# Patient Record
Sex: Male | Born: 1935 | Race: White | Hispanic: No | Marital: Married | Smoking: Former smoker
Health system: Southern US, Community
[De-identification: ages and names within clinical notes are randomized; demographics above are authoritative.]

## PROBLEM LIST (undated history)

## (undated) DIAGNOSIS — Z7901 Long term (current) use of anticoagulants: Secondary | ICD-10-CM

## (undated) DIAGNOSIS — I10 Essential (primary) hypertension: Secondary | ICD-10-CM

## (undated) DIAGNOSIS — I350 Nonrheumatic aortic (valve) stenosis: Secondary | ICD-10-CM

## (undated) DIAGNOSIS — D696 Thrombocytopenia, unspecified: Secondary | ICD-10-CM

## (undated) DIAGNOSIS — Z9889 Other specified postprocedural states: Secondary | ICD-10-CM

## (undated) DIAGNOSIS — N183 Chronic kidney disease, stage 3 unspecified: Secondary | ICD-10-CM

## (undated) DIAGNOSIS — E785 Hyperlipidemia, unspecified: Secondary | ICD-10-CM

## (undated) DIAGNOSIS — K5790 Diverticulosis of intestine, part unspecified, without perforation or abscess without bleeding: Secondary | ICD-10-CM

## (undated) DIAGNOSIS — I2510 Atherosclerotic heart disease of native coronary artery without angina pectoris: Secondary | ICD-10-CM

## (undated) DIAGNOSIS — G459 Transient cerebral ischemic attack, unspecified: Secondary | ICD-10-CM

## (undated) DIAGNOSIS — I5042 Chronic combined systolic (congestive) and diastolic (congestive) heart failure: Secondary | ICD-10-CM

## (undated) DIAGNOSIS — I6529 Occlusion and stenosis of unspecified carotid artery: Secondary | ICD-10-CM

## (undated) DIAGNOSIS — D126 Benign neoplasm of colon, unspecified: Secondary | ICD-10-CM

## (undated) DIAGNOSIS — I4821 Permanent atrial fibrillation: Secondary | ICD-10-CM

## (undated) DIAGNOSIS — I499 Cardiac arrhythmia, unspecified: Secondary | ICD-10-CM

## (undated) DIAGNOSIS — I5032 Chronic diastolic (congestive) heart failure: Secondary | ICD-10-CM

## (undated) HISTORY — PX: CATARACT EXTRACTION W/ INTRAOCULAR LENS  IMPLANT, BILATERAL: SHX1307

## (undated) HISTORY — PX: CORONARY ARTERY BYPASS GRAFT: SHX141

## (undated) HISTORY — DX: Transient cerebral ischemic attack, unspecified: G45.9

## (undated) HISTORY — DX: Permanent atrial fibrillation: I48.21

## (undated) HISTORY — DX: Diverticulosis of intestine, part unspecified, without perforation or abscess without bleeding: K57.90

## (undated) HISTORY — PX: MITRAL VALVE REPAIR: SHX2039

## (undated) HISTORY — DX: Atherosclerotic heart disease of native coronary artery without angina pectoris: I25.10

## (undated) HISTORY — DX: Chronic combined systolic (congestive) and diastolic (congestive) heart failure: I50.42

## (undated) HISTORY — DX: Nonrheumatic aortic (valve) stenosis: I35.0

## (undated) HISTORY — DX: Hyperlipidemia, unspecified: E78.5

## (undated) HISTORY — DX: Essential (primary) hypertension: I10

## (undated) HISTORY — DX: Benign neoplasm of colon, unspecified: D12.6

## (undated) HISTORY — DX: Occlusion and stenosis of unspecified carotid artery: I65.29

## (undated) HISTORY — DX: Chronic kidney disease, stage 3 (moderate): N18.3

## (undated) HISTORY — DX: Chronic kidney disease, stage 3 unspecified: N18.30

## (undated) HISTORY — DX: Chronic diastolic (congestive) heart failure: I50.32

---

## 1998-08-19 ENCOUNTER — Ambulatory Visit (HOSPITAL_COMMUNITY): Admission: RE | Admit: 1998-08-19 | Discharge: 1998-08-19 | Payer: Self-pay | Admitting: Cardiology

## 2001-04-08 ENCOUNTER — Encounter: Payer: Self-pay | Admitting: Internal Medicine

## 2001-04-08 ENCOUNTER — Ambulatory Visit (HOSPITAL_COMMUNITY): Admission: RE | Admit: 2001-04-08 | Discharge: 2001-04-08 | Payer: Self-pay | Admitting: Internal Medicine

## 2002-03-19 HISTORY — PX: CORONARY ARTERY BYPASS GRAFT: SHX141

## 2002-09-14 ENCOUNTER — Encounter: Payer: Self-pay | Admitting: Cardiothoracic Surgery

## 2002-09-16 ENCOUNTER — Encounter: Payer: Self-pay | Admitting: Cardiothoracic Surgery

## 2002-09-16 ENCOUNTER — Inpatient Hospital Stay (HOSPITAL_COMMUNITY): Admission: RE | Admit: 2002-09-16 | Discharge: 2002-09-21 | Payer: Self-pay | Admitting: Cardiothoracic Surgery

## 2002-09-16 DIAGNOSIS — Z951 Presence of aortocoronary bypass graft: Secondary | ICD-10-CM

## 2002-09-16 DIAGNOSIS — Z9889 Other specified postprocedural states: Secondary | ICD-10-CM

## 2002-09-16 HISTORY — DX: Other specified postprocedural states: Z98.890

## 2002-09-16 HISTORY — DX: Presence of aortocoronary bypass graft: Z95.1

## 2002-09-17 ENCOUNTER — Encounter: Payer: Self-pay | Admitting: Cardiothoracic Surgery

## 2002-09-18 ENCOUNTER — Encounter: Payer: Self-pay | Admitting: Cardiothoracic Surgery

## 2002-09-19 ENCOUNTER — Encounter: Payer: Self-pay | Admitting: Cardiothoracic Surgery

## 2002-11-02 ENCOUNTER — Encounter (HOSPITAL_COMMUNITY): Admission: RE | Admit: 2002-11-02 | Discharge: 2003-01-31 | Payer: Self-pay | Admitting: *Deleted

## 2006-06-17 ENCOUNTER — Ambulatory Visit: Payer: Self-pay | Admitting: Vascular Surgery

## 2010-11-23 ENCOUNTER — Emergency Department (HOSPITAL_COMMUNITY): Payer: Medicare Other

## 2010-11-23 ENCOUNTER — Inpatient Hospital Stay (HOSPITAL_COMMUNITY)
Admission: EM | Admit: 2010-11-23 | Discharge: 2010-11-27 | DRG: 310 | Disposition: A | Discharge status: Home / Self Care | Payer: Medicare Other | Attending: Emergency Medicine | Admitting: Emergency Medicine

## 2010-11-23 DIAGNOSIS — E785 Hyperlipidemia, unspecified: Secondary | ICD-10-CM | POA: Diagnosis present

## 2010-11-23 DIAGNOSIS — I10 Essential (primary) hypertension: Secondary | ICD-10-CM | POA: Diagnosis present

## 2010-11-23 DIAGNOSIS — Z951 Presence of aortocoronary bypass graft: Secondary | ICD-10-CM

## 2010-11-23 DIAGNOSIS — I2510 Atherosclerotic heart disease of native coronary artery without angina pectoris: Secondary | ICD-10-CM | POA: Diagnosis present

## 2010-11-23 DIAGNOSIS — Z7901 Long term (current) use of anticoagulants: Secondary | ICD-10-CM

## 2010-11-23 DIAGNOSIS — Z8673 Personal history of transient ischemic attack (TIA), and cerebral infarction without residual deficits: Secondary | ICD-10-CM

## 2010-11-23 DIAGNOSIS — I4891 Unspecified atrial fibrillation: Principal | ICD-10-CM | POA: Diagnosis present

## 2010-11-23 DIAGNOSIS — Z7982 Long term (current) use of aspirin: Secondary | ICD-10-CM

## 2010-11-23 LAB — CBC
HCT: 40.1 % (ref 39.0–52.0)
Hemoglobin: 13.5 g/dL (ref 13.0–17.0)
MCH: 29.7 pg (ref 26.0–34.0)
MCHC: 33.7 g/dL (ref 30.0–36.0)
MCV: 88.1 fL (ref 78.0–100.0)
Platelets: 142 10*3/uL — ABNORMAL LOW (ref 150–400)
RBC: 4.55 MIL/uL (ref 4.22–5.81)
RDW: 14.5 % (ref 11.5–15.5)
WBC: 7.2 10*3/uL (ref 4.0–10.5)

## 2010-11-23 LAB — DIFFERENTIAL
Basophils Absolute: 0 10*3/uL (ref 0.0–0.1)
Basophils Relative: 0 % (ref 0–1)
Eosinophils Absolute: 0.2 10*3/uL (ref 0.0–0.7)
Eosinophils Relative: 3 % (ref 0–5)
Lymphocytes Relative: 31 % (ref 12–46)
Lymphs Abs: 2.2 10*3/uL (ref 0.7–4.0)
Monocytes Absolute: 0.6 10*3/uL (ref 0.1–1.0)
Monocytes Relative: 9 % (ref 3–12)
Neutro Abs: 4.1 10*3/uL (ref 1.7–7.7)
Neutrophils Relative %: 57 % (ref 43–77)

## 2010-11-23 LAB — COMPREHENSIVE METABOLIC PANEL
ALT: 21 U/L (ref 0–53)
AST: 23 U/L (ref 0–37)
Albumin: 3.9 g/dL (ref 3.5–5.2)
Alkaline Phosphatase: 64 U/L (ref 39–117)
BUN: 28 mg/dL — ABNORMAL HIGH (ref 6–23)
CO2: 26 mEq/L (ref 19–32)
Calcium: 9.3 mg/dL (ref 8.4–10.5)
Chloride: 105 mEq/L (ref 96–112)
Creatinine, Ser: 1.51 mg/dL — ABNORMAL HIGH (ref 0.50–1.35)
GFR calc Af Amer: 55 mL/min — ABNORMAL LOW (ref >60)
GFR calc non Af Amer: 45 mL/min — ABNORMAL LOW (ref >60)
Glucose, Bld: 134 mg/dL — ABNORMAL HIGH (ref 70–99)
Potassium: 3.9 mEq/L (ref 3.5–5.1)
Sodium: 140 mEq/L (ref 135–145)
Total Bilirubin: 0.3 mg/dL (ref 0.3–1.2)
Total Protein: 6.6 g/dL (ref 6.0–8.3)

## 2010-11-23 LAB — POCT I-STAT TROPONIN I: Troponin i, poc: 0.01 ng/mL (ref 0.00–0.08)

## 2010-11-23 LAB — CK TOTAL AND CKMB (NOT AT ARMC)
CK, MB: 3.2 ng/mL (ref 0.3–4.0)
Total CK: 141 U/L (ref 7–232)

## 2010-11-24 DIAGNOSIS — I059 Rheumatic mitral valve disease, unspecified: Secondary | ICD-10-CM

## 2010-11-24 LAB — LIPID PANEL
Cholesterol: 154 mg/dL (ref 0–200)
HDL: 41 mg/dL (ref >39)
Total CHOL/HDL Ratio: 3.8 RATIO
Triglycerides: 50 mg/dL (ref <150)

## 2010-11-24 LAB — CARDIAC PANEL(CRET KIN+CKTOT+MB+TROPI)
Relative Index: INVALID (ref 0.0–2.5)
Total CK: 78 U/L (ref 7–232)
Total CK: 71 U/L (ref 7–232)

## 2010-11-24 LAB — TSH: TSH: 5.311 u[IU]/mL — ABNORMAL HIGH (ref 0.350–4.500)

## 2010-11-24 LAB — PROTIME-INR
INR: 1.07 (ref 0.00–1.49)
Prothrombin Time: 14.1 seconds (ref 11.6–15.2)

## 2010-11-24 LAB — TROPONIN I
Troponin I: <0.3 ng/mL (ref <0.30)
Troponin I: <0.3 ng/mL (ref <0.30)

## 2010-11-25 LAB — PROTIME-INR: INR: 1.08 (ref 0.00–1.49)

## 2010-11-25 LAB — CARDIAC PANEL(CRET KIN+CKTOT+MB+TROPI)
Relative Index: INVALID (ref 0.0–2.5)
Relative Index: INVALID (ref 0.0–2.5)
Total CK: 60 U/L (ref 7–232)
Troponin I: <0.3 ng/mL (ref <0.30)
Troponin I: <0.3 ng/mL (ref <0.30)

## 2010-11-25 LAB — HEPARIN LEVEL (UNFRACTIONATED): Heparin Unfractionated: 0.41 IU/mL (ref 0.30–0.70)

## 2010-11-25 LAB — CBC
HCT: 38.7 % — ABNORMAL LOW (ref 39.0–52.0)
Hemoglobin: 12.8 g/dL — ABNORMAL LOW (ref 13.0–17.0)
RBC: 4.37 MIL/uL (ref 4.22–5.81)
RDW: 14.4 % (ref 11.5–15.5)
WBC: 6.8 10*3/uL (ref 4.0–10.5)

## 2010-11-26 LAB — PROTIME-INR
INR: 1.12 (ref 0.00–1.49)
Prothrombin Time: 14.6 seconds (ref 11.6–15.2)

## 2010-11-26 LAB — DIFFERENTIAL
Basophils Absolute: 0 10*3/uL (ref 0.0–0.1)
Basophils Relative: 1 % (ref 0–1)
Eosinophils Relative: 3 % (ref 0–5)
Lymphocytes Relative: 23 % (ref 12–46)
Monocytes Absolute: 0.8 10*3/uL (ref 0.1–1.0)
Monocytes Relative: 10 % (ref 3–12)

## 2010-11-26 LAB — CARDIAC PANEL(CRET KIN+CKTOT+MB+TROPI)
CK, MB: 2.4 ng/mL (ref 0.3–4.0)
CK, MB: 1.9 ng/mL (ref 0.3–4.0)
Relative Index: INVALID (ref 0.0–2.5)
Relative Index: INVALID (ref 0.0–2.5)
Troponin I: <0.3 ng/mL (ref <0.30)
Troponin I: <0.3 ng/mL (ref <0.30)

## 2010-11-26 LAB — CBC
MCHC: 34.1 g/dL (ref 30.0–36.0)
Platelets: 125 10*3/uL — ABNORMAL LOW (ref 150–400)
RDW: 14.3 % (ref 11.5–15.5)

## 2010-11-26 LAB — HEPARIN LEVEL (UNFRACTIONATED): Heparin Unfractionated: 0.77 IU/mL — ABNORMAL HIGH (ref 0.30–0.70)

## 2010-11-30 LAB — CULTURE, BLOOD (ROUTINE X 2): Culture  Setup Time: 201209072334

## 2010-12-28 NOTE — Discharge Summary (Signed)
NAMEBRAYSEN, [name] NO.:  [id number]  MEDICAL RECORD NO.:  [id number]  LOCATION:  [id number]                         FACILITY:  [facility]  PHYSICIAN:  Leana Gamer, MDDATE OF BIRTH:  06/27/1931  DATE OF ADMISSION:  11/23/2010 DATE OF DISCHARGE:  11/27/2010                              DISCHARGE SUMMARY   DISCHARGE DISPOSITION:  Home.  FINAL DISCHARGE DIAGNOSES: 1. Paroxysmal atrial fibrillation with rapid ventricular rate now     converted to sinus rhythm.  Rate controlled. 2. Hyperlipidemia. 3. Hypertension. 4. History of mitral valve annuloplasty. 5. History of transient ischemic attack in the past. 6. Coronary artery disease with one-vessel bypass.  DISCHARGE MEDICATIONS: 1. Coumadin.  The patient is to take 10 mg p.o. x1 tonight and then     7.5 mg p.o. x1 tomorrow, November 28, 2010, then have INR checked     on Wednesday, November 29, 2010, and further titrate to a goal INR     of 2-3. 2. Diltiazem 120 mg p.o. daily. 3. Pravachol 40 mg p.o. at bedtime.  CONSULTANTS:  None.  PROCEDURES:  None.  DIAGNOSTIC STUDIES: 1. Portable chest x-ray on admission which shows no evidence of active     pulmonary disease. 2. Two-D echocardiogram done on November 24, 2010, which showed left     ventricular cavity size normal with systolic function preserved at     60-65%, and moderate mitral valve stenosis.  PRIMARY CARE PHYSICIAN:  Azalia Bilis, MD  ALLERGIES:  No known drug allergies.  CODE STATUS:  Full code.  CHIEF COMPLAINT:  Palpitations and lightheadedness.  HISTORY OF PRESENT ILLNESS:  Please refer to the H and P by Dr. Raynelle Chary for details of the HPI, however, in short this is a 77 year old gentleman who had had several episodes of palpitations and lightheadedness for 2 days prior to presentation.  The patient states that he has checked in his blood pressure on his wife's blood pressure cuff and saw that his heart rate was very high.  He  said this correlated with palpitations that he was having, thus he came to the emergency room for further evaluation and management.  HOSPITAL COURSE: 1. Atrial fibrillation with rapid ventricular response.  The patient     was given a Cardizem bolus and drip and converted to sinus rhythm.     He was then transitioned over to oral Cardizem short-acting and     then to long-acting Cardizem.  At the time of discharge, the     patient's heart rate is well controlled and has been controlled for     the last 24 hours on long-acting Cardizem.  The patient was also     started on full-dose heparin on admission and Coumadin was started.     He was then transitioned over to Lovenox.  The patient has a [place]     score of 1 and does not require overlap with Coumadin for     titration.  The patient will follow with [facility] as     an outpatient for further titration of his medications.  The     patient's hospital course was prolonged and  although he was     medically stable for discharge, he could not be safely discharged  and we did not have a followup for the patient's first Coumadin     that has been achieved, then the patient will be discharged home.     At the time of discharge, the patient's INR is 1.37.  The patient     will take 10 mg of Coumadin tonight, 7.5 mg tomorrow and then     further titration per [facility]. 2. Hyperlipidemia.  The patient is continued on his Pravachol.  The patient does take herbal vitamins for colon cancer and prostate health.  There is some question as to whether or not he should continue these right now or discontinue these medications and he can have further direction from his primary care physician as to whether he should continue taking this.  PHYSICAL EXAMINATION:  GENERAL:  At the time of discharge, the patient is stable. VITAL SIGNS:  Temperature is 97.9, heart rate 75, blood pressure 149/77, respiratory rate 18, O2 sats are 99%  on room air. HEENT:  Normocephalic, atraumatic.  Pupils are equally round and reactive to light and accommodation.  Extraocular movements are intact. Oropharynx is moist.  No active erythema or lesions are noted. NECK:  Trachea is midline.  No masses, no thyromegaly, no JVD, no carotid bruits. RESPIRATORY:  The patient has a normal respiratory effort, equal excursion bilaterally.  No wheezing, no rhonchi noted. CARDIOVASCULAR:  He has got normal S1-S2.  No murmurs, rubs or gallops are noted.  PMI is nondisplaced.  No heaves or thrills on palpation. ABDOMEN:  Obese, soft, nontender, nondistended.  No masses, no hepatosplenomegaly noted. EXTREMITY:  No clubbing, cyanosis or edema.  DIETARY RESTRICTIONS:  The patient should be on a heart-healthy diet.  PHYSICAL RESTRICTIONS:  Activity as tolerated.  FOLLOWUP:  The patient is to follow up with his primary care physician, Dr. Samara Snide within 1 week.  The patient also has an appointment with [facility] for Wednesday, November 29, 2010, between 7 a.m. and 6 p.m.     Leana Gamer, MD    MAM/MEDQ  D:  11/27/2010  T:  11/27/2010  Job:  [id number]  Electronically Signed by Liston Alba MD on 12/28/2010 07:20:26 AM

## 2010-12-31 NOTE — H&P (Signed)
[name], Phillips NO.:  [id number]  MEDICAL RECORD NO.:  [id number]  LOCATION:  [facility]                         FACILITY:  [facility]  PHYSICIAN:  Orvan Falconer, MD           DATE OF BIRTH:  1931-08-23  DATE OF ADMISSION:  11/23/2010 DATE OF DISCHARGE:                             HISTORY & PHYSICAL   PRIMARY CARE PHYSICIAN:  Dr. Kenton Kingfisher.  ADVANCE DIRECTIVE:  Full code.  REASON FOR ADMISSION:  Atrial fibrillation with rapid ventricular rate.  HISTORY OF PRESENT ILLNESS:  This is a 77 year old male with a history of mitral valve annuloplasty and ring replacement in 2004 , postop atrial fibrillation, recurrent TIAs, presents to the emergency room with a 2-day history of feeling palpitations and lightheadedness.  He was found to be in atrial fibrillation with rapid ventricular rate in emergency room.  He was given Cardizem bolus and a drip and heparin was started.  He could not delineate exactly how long he was in atrial fibrillation.  He stated his TIA is usually with blurry vision and lasting several minutes about once a month.  Subsequently, he converted to normal sinus rhythm.  Further evaluation showed creatinine 1.5, blood glucose 134, potassium 3.9, his chest x-ray is clear, and his cardiac enzymes were unremarkable.  He has a normal white count of 7.2 thousand, hemoglobin of 13.5.  Hospitalist was asked to admit the patient for AFib with rapid ventricular rate.  PAST MEDICAL HISTORY: 1. Recurrent TIA. 2. Mitral valve annuloplasty. 3. One-vessel bypass. 4. Hypercholesterolemia.  CURRENT MEDICATIONS:  Aspirin and 40 mg of pravastatin.  ALLERGIES:  NO KNOWN DRUG ALLERGIES.  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  He is married, with children.  Denied tobacco, alcohol, or drug use.  REVIEW OF SYSTEMS:  Otherwise, unremarkable.  PHYSICAL EXAM:  VITAL SIGNS:  Blood pressure is 118/66, temperature of 98.2, pulse of 72, respiratory  rate is 16. GENERAL:  He is alert, oriented and is in no apparent distress. HEENT:  Pupils equal, round, reactive.  Sclerae nonicteric. NECK:  Supple. CARDIAC:  S1 and S2 with a 2/6 systolic ejection murmur, irregularly irregular. LUNGS:  Clear with no wheezes, rales, or any evidence of consolidation. ABDOMEN:  Soft, nondistended, nontender.  He has an umbilical hernia centrally. EXTREMITIES:  No edema.  No calf tenderness. SKIN:  Warm and dry.  Good distal pulses bilaterally. NEUROLOGIC:  Unremarkable. PSYCHIATRIC:  Unremarkable. He has no carotid bruits audible.  OBJECTIVE FINDINGS:  White count 7.2 thousand, hemoglobin of 13.5, platelet count 142,000.  Chest x-ray is clear.  Potassium 3.9, creatinine 1.5, glucose 134.  Liver function tests are normal.  Troponin 0.1.  IMPRESSION:  This is a 77 year old with recurrent transient ischemic attacks and likely had paroxysmal atrial fibrillation (CHADS score of 3), admitted tonight for atrial fibrillation with rapid ventricular rate.  I suspect that he had atrial fibrillation greater than 2 days, not realizing it, therefore, we will fully anticoagulate with heparin and Coumadin.  We will start him on Cardizem 30 mg p.o. q.6 h. and stop the Cardizem drip.  He is stable, full code, and will  be admitted to [facility].  We will check a TSH and rule him out.  We will get an echo of his heart as well.     Orvan Falconer, MD     PL/MEDQ  D:  11/24/2010  T:  11/24/2010  Job:  [id number]  Electronically Signed by Orvan Falconer  on 12/31/2010 01:17:54 PM

## 2011-04-11 ENCOUNTER — Encounter: Payer: Self-pay | Admitting: General Practice

## 2011-04-17 ENCOUNTER — Encounter: Payer: Self-pay | Admitting: Internal Medicine

## 2011-05-10 ENCOUNTER — Encounter: Payer: Self-pay | Admitting: *Deleted

## 2011-05-15 ENCOUNTER — Encounter: Payer: Self-pay | Admitting: Internal Medicine

## 2011-05-22 ENCOUNTER — Encounter: Payer: Self-pay | Admitting: Internal Medicine

## 2011-05-22 ENCOUNTER — Ambulatory Visit (INDEPENDENT_AMBULATORY_CARE_PROVIDER_SITE_OTHER): Payer: Medicare Other | Admitting: Internal Medicine

## 2011-05-22 DIAGNOSIS — D689 Coagulation defect, unspecified: Secondary | ICD-10-CM

## 2011-05-22 DIAGNOSIS — Z1211 Encounter for screening for malignant neoplasm of colon: Secondary | ICD-10-CM

## 2011-05-22 MED ORDER — PEG-KCL-NACL-NASULF-NA ASC-C 100 G PO SOLR: 1.0000 | Freq: Once | ORAL | Status: DC

## 2011-05-22 NOTE — Progress Notes (Signed)
[name] 10-Apr-1931 MRN [id number]   History of Present Illness:  This is a 78 year old white male who is here to discuss a colonoscopy. We did a screening colonoscopy in December 2002 which showed mild diverticulosis of the left colon. He had an episode of atrial fibrillation in September 2012 and has had several episodes of TIAs. He has been put on Coumadin and has maintained his INR around 2-3. He had a coronary artery bypass graft of one-vessel and mitral valve repair in 2004. He has regular bowel habits. He denies abdominal pain or family history of colon cancer.   Past Medical History  Diagnosis Date  . Atrial fibrillation   . Diverticulosis 2002    Colonoscopy  . TIA (transient ischemic attack)   . Hyperlipidemia   . Hypertension   . Hemorrhoids    Past Surgical History  Procedure Date  . Coronary artery bypass graft   . Mitral valve repair     reports that he has never smoked. He has never used smokeless tobacco. He reports that he does not drink alcohol or use illicit drugs. family history is not on file. Allergies not on file      Review of Systems: Denies heartburn dysphagia or shortness of breath or chest pain  The remainder of the 10 point ROS is negative except as outlined in H&P   Physical Exam: General appearance  Well developed, in no distress. Eyes- non icteric. HEENT nontraumatic, normocephalic. Mouth no lesions, tongue papillated, no cheilosis. Neck supple without adenopathy, thyroid not enlarged, no carotid bruits, no JVD. Lungs Clear to auscultation bilaterally., Well-healed thoracotomy scar Cor normal S1, normal S2, regular rhythm, no murmur,  quiet precordium. Abdomen: Soft nontender abdomen with normal active bowel sounds. Liver edge at costal margin. Rectal: Not done Extremities trace pedal edema. Skin no lesions. Ecchymoses on the dorsum of his hands. Neurological alert and oriented x 3. Psychological normal mood and  affect.  Assessment and Plan:  Problem #26 78 year old, white male with a history of heart disease and a normal colonoscopy 10 years ago. He is an appropriate candidate for a screening colonoscopy and he wishes to go ahead with the procedure. Because of his history of TIAs, he will stay on Coumadin and have his INR come down to around 2 prior to the procedure. We will hold his Coumadin for 2 days instead of the usual 5 days prior to the procedure which would  allow me to cauterize possible polyps or take biopsies if necessary. He had a mitral annuloplasty. Current guidelines for SBE prophylaxis do not require patient's to get antibiotics prior to a colonoscopy.   05/22/2011 Delfin Edis

## 2011-05-22 NOTE — Patient Instructions (Addendum)
You have been scheduled for a colonoscopy with propofol. Please follow written instructions given to you at your visit today.  Please pick up your prep kit at the pharmacy within the next 1-3 days. Per Dr Delfin Edis, please hold your coumadin only TWO days prior to your exam. CC: Dr Azalia Bilis, Dr Lacie Draft

## 2011-06-27 ENCOUNTER — Encounter: Payer: Self-pay | Admitting: Internal Medicine

## 2011-06-27 ENCOUNTER — Ambulatory Visit (AMBULATORY_SURGERY_CENTER): Payer: Medicare Other | Admitting: Internal Medicine

## 2011-06-27 VITALS — BP 150/76 | HR 76 | Temp 96.3°F | Resp 20 | Ht 69.0 in | Wt 201.0 lb

## 2011-06-27 DIAGNOSIS — Z1211 Encounter for screening for malignant neoplasm of colon: Secondary | ICD-10-CM

## 2011-06-27 DIAGNOSIS — K6389 Other specified diseases of intestine: Secondary | ICD-10-CM

## 2011-06-27 DIAGNOSIS — D126 Benign neoplasm of colon, unspecified: Secondary | ICD-10-CM

## 2011-06-27 MED ORDER — SODIUM CHLORIDE 0.9 % IV SOLN: 500.0000 mL | INTRAVENOUS | Status: DC

## 2011-06-27 NOTE — Op Note (Signed)
[facility] [address] [place], [place]  [id number]  COLONOSCOPY PROCEDURE REPORT  PATIENT:  Micheal, Phillips  MR#:  [id number] BIRTHDATE:  1931/08/26, 79 yrs. old  GENDER:  male ENDOSCOPIST:  Lowella Bandy. Olevia Perches, MD REF. BY:  Azalia Bilis, M.D. PROCEDURE DATE:  06/27/2011 PROCEDURE:  Colonoscopy with biopsy ASA CLASS:  Class III INDICATIONS:  colorectal cancer screening, average risk last colon 2000, off Coumadin x5 days MEDICATIONS:   MAC sedation, administered by CRNA, propofol (Diprivan) 170 mg  DESCRIPTION OF PROCEDURE:   After the risks and benefits and of the procedure were explained, informed consent was obtained. Digital rectal exam was performed and revealed no rectal masses. The LB CF-[id number] endoscope was introduced through the anus and advanced to the cecum, which was identified by both the appendix and ileocecal valve.  The quality of the prep was good, using MoviPrep.  The instrument was then slowly withdrawn as the colon was fully examined. <<PROCEDUREIMAGES>>  FINDINGS:  A sessile polyp was found in the cecum. 4 mm diminutive poly The polyp was removed using cold biopsy forceps (see image5). Melanosis coli was found throughout the colon (see image4, image2, and image3).  Mild diverticulosis was found in the sigmoid colon (see image1).  Otherwise normal colonoscopy without other polyps, masses, vascular ectasias, or inflammatory changes (see image7, image6, and image4).   Retroflexed views in the rectum revealed no abnormalities.    The scope was then withdrawn from the patient and the procedure completed.  COMPLICATIONS:  None ENDOSCOPIC IMPRESSION: 1) Sessile polyp in the cecum 2) Melanosis throughout the colon 3) Mild diverticulosis in the sigmoid colon 4) Otherwise nl colonoscopy WMO RECOMMENDATIONS: 1) Await pathology results 2) High fiber diet. reduce laxative use may resume Coumadin on 06/27/2009  REPEAT EXAM:  In 0 year(s) for.  no  recall due to age and risk factors  ______________________________ Lowella Bandy. Olevia Perches, MD  CC:  n. eSIGNED:   [name] at 06/27/2011 02:06 PM  Quenten Raven, [id number]

## 2011-06-27 NOTE — Patient Instructions (Signed)
YOU HAD AN ENDOSCOPIC PROCEDURE TODAY AT THE [facility]: Refer to the procedure report that was given to you for any specific questions about what was found during the examination.  If the procedure report does not answer your questions, please call your gastroenterologist to clarify.  If you requested that your care partner not be given the details of your procedure findings, then the procedure report has been included in a sealed envelope for you to review at your convenience later.  YOU SHOULD EXPECT: Some feelings of bloating in the abdomen. Passage of more gas than usual.  Walking can help get rid of the air that was put into your GI tract during the procedure and reduce the bloating. If you had a lower endoscopy (such as a colonoscopy or flexible sigmoidoscopy) you may notice spotting of blood in your stool or on the toilet paper. If you underwent a bowel prep for your procedure, then you may not have a normal bowel movement for a few days.  DIET: Your first meal following the procedure should be a light meal and then it is ok to progress to your normal diet.  A half-sandwich or bowl of soup is an example of a good first meal.  Heavy or fried foods are harder to digest and may make you feel nauseous or bloated.  Likewise meals heavy in dairy and vegetables can cause extra gas to form and this can also increase the bloating.  Drink plenty of fluids but you should avoid alcoholic beverages for 24 hours.  ACTIVITY: Your care partner should take you home directly after the procedure.  You should plan to take it easy, moving slowly for the rest of the day.  You can resume normal activity the day after the procedure however you should NOT DRIVE or use heavy machinery for 24 hours (because of the sedation medicines used during the test).    SYMPTOMS TO REPORT IMMEDIATELY: A gastroenterologist can be reached at any hour.  During normal business hours, 8:30 AM to 5:00 PM Monday through Friday,  call [phone].  After hours and on weekends, please call the GI answering service at [phone] who will take a message and have the physician on call contact you.   Following lower endoscopy (colonoscopy or flexible sigmoidoscopy):  Excessive amounts of blood in the stool  Significant tenderness or worsening of abdominal pains  Swelling of the abdomen that is new, acute  Fever of 100F or higher    FOLLOW UP: If any biopsies were taken you will be contacted by phone or by letter within the next 1-3 weeks.  Call your gastroenterologist if you have not heard about the biopsies in 3 weeks.  Our staff will call the home number listed on your records the next business day following your procedure to check on you and address any questions or concerns that you may have at that time regarding the information given to you following your procedure. This is a courtesy call and so if there is no answer at the home number and we have not heard from you through the emergency physician on call, we will assume that you have returned to your regular daily activities without incident.  SIGNATURES/CONFIDENTIALITY: You and/or your care partner have signed paperwork which will be entered into your electronic medical record.  These signatures attest to the fact that that the information above on your After Visit Summary has been reviewed and is understood.  Full responsibility of the confidentiality   of this discharge information lies with you and/or your care-partner.     

## 2011-06-27 NOTE — Progress Notes (Signed)
Patient did not experience any of the following events: a burn prior to discharge; a fall within the facility; wrong site/side/patient/procedure/implant event; or a hospital transfer or hospital admission upon discharge from the facility. (G8907) Patient did not have preoperative order for IV antibiotic SSI prophylaxis. (G8918)  

## 2011-06-28 ENCOUNTER — Telehealth: Payer: Self-pay

## 2011-06-28 NOTE — Telephone Encounter (Signed)
  Follow up Call-  Call back number 06/27/2011  Post procedure Call Back phone  # [phone]  Permission to leave phone message Yes     Patient questions:  Do you have a fever, pain , or abdominal swelling? no Pain Score  0 *  Have you tolerated food without any problems? yes  Have you been able to return to your normal activities? yes  Do you have any questions about your discharge instructions: Diet   no Medications  no Follow up visit  no  Do you have questions or concerns about your Care? no  Actions: * If pain score is 4 or above: No action needed, pain <4.

## 2011-07-03 ENCOUNTER — Encounter: Payer: Self-pay | Admitting: Internal Medicine

## 2013-07-10 ENCOUNTER — Encounter: Payer: Self-pay | Admitting: *Deleted

## 2013-08-07 ENCOUNTER — Ambulatory Visit: Payer: Medicare Other | Admitting: Internal Medicine

## 2014-01-09 ENCOUNTER — Encounter: Payer: Self-pay | Admitting: *Deleted

## 2014-02-06 ENCOUNTER — Inpatient Hospital Stay (HOSPITAL_COMMUNITY)
Admission: EM | Admit: 2014-02-06 | Discharge: 2014-02-09 | DRG: 682 | Disposition: A | Discharge status: Home / Self Care | Payer: Medicare Other | Attending: Internal Medicine | Admitting: Internal Medicine

## 2014-02-06 ENCOUNTER — Emergency Department (HOSPITAL_COMMUNITY): Payer: Medicare Other

## 2014-02-06 ENCOUNTER — Encounter (HOSPITAL_COMMUNITY): Payer: Self-pay | Admitting: *Deleted

## 2014-02-06 DIAGNOSIS — Z952 Presence of prosthetic heart valve: Secondary | ICD-10-CM

## 2014-02-06 DIAGNOSIS — N183 Chronic kidney disease, stage 3 (moderate): Secondary | ICD-10-CM | POA: Diagnosis present

## 2014-02-06 DIAGNOSIS — N179 Acute kidney failure, unspecified: Secondary | ICD-10-CM | POA: Diagnosis present

## 2014-02-06 DIAGNOSIS — I5042 Chronic combined systolic (congestive) and diastolic (congestive) heart failure: Secondary | ICD-10-CM | POA: Diagnosis present

## 2014-02-06 DIAGNOSIS — Z7901 Long term (current) use of anticoagulants: Secondary | ICD-10-CM

## 2014-02-06 DIAGNOSIS — E785 Hyperlipidemia, unspecified: Secondary | ICD-10-CM | POA: Diagnosis present

## 2014-02-06 DIAGNOSIS — E039 Hypothyroidism, unspecified: Secondary | ICD-10-CM | POA: Diagnosis present

## 2014-02-06 DIAGNOSIS — Z951 Presence of aortocoronary bypass graft: Secondary | ICD-10-CM | POA: Diagnosis not present

## 2014-02-06 DIAGNOSIS — R918 Other nonspecific abnormal finding of lung field: Secondary | ICD-10-CM

## 2014-02-06 DIAGNOSIS — I482 Chronic atrial fibrillation: Secondary | ICD-10-CM | POA: Diagnosis present

## 2014-02-06 DIAGNOSIS — I340 Nonrheumatic mitral (valve) insufficiency: Secondary | ICD-10-CM | POA: Diagnosis present

## 2014-02-06 DIAGNOSIS — I5032 Chronic diastolic (congestive) heart failure: Secondary | ICD-10-CM | POA: Diagnosis present

## 2014-02-06 DIAGNOSIS — R531 Weakness: Secondary | ICD-10-CM

## 2014-02-06 DIAGNOSIS — Z86010 Personal history of colonic polyps: Secondary | ICD-10-CM | POA: Diagnosis not present

## 2014-02-06 DIAGNOSIS — I509 Heart failure, unspecified: Secondary | ICD-10-CM

## 2014-02-06 DIAGNOSIS — I129 Hypertensive chronic kidney disease with stage 1 through stage 4 chronic kidney disease, or unspecified chronic kidney disease: Secondary | ICD-10-CM | POA: Diagnosis present

## 2014-02-06 DIAGNOSIS — I4821 Permanent atrial fibrillation: Secondary | ICD-10-CM | POA: Diagnosis present

## 2014-02-06 DIAGNOSIS — J189 Pneumonia, unspecified organism: Secondary | ICD-10-CM | POA: Diagnosis present

## 2014-02-06 DIAGNOSIS — I10 Essential (primary) hypertension: Secondary | ICD-10-CM | POA: Diagnosis present

## 2014-02-06 DIAGNOSIS — I5043 Acute on chronic combined systolic (congestive) and diastolic (congestive) heart failure: Secondary | ICD-10-CM | POA: Diagnosis present

## 2014-02-06 DIAGNOSIS — Z8673 Personal history of transient ischemic attack (TIA), and cerebral infarction without residual deficits: Secondary | ICD-10-CM | POA: Diagnosis not present

## 2014-02-06 DIAGNOSIS — Z7982 Long term (current) use of aspirin: Secondary | ICD-10-CM

## 2014-02-06 DIAGNOSIS — I480 Paroxysmal atrial fibrillation: Secondary | ICD-10-CM | POA: Diagnosis present

## 2014-02-06 DIAGNOSIS — I2510 Atherosclerotic heart disease of native coronary artery without angina pectoris: Secondary | ICD-10-CM | POA: Diagnosis present

## 2014-02-06 DIAGNOSIS — Z9889 Other specified postprocedural states: Secondary | ICD-10-CM

## 2014-02-06 DIAGNOSIS — D696 Thrombocytopenia, unspecified: Secondary | ICD-10-CM | POA: Diagnosis present

## 2014-02-06 HISTORY — DX: Long term (current) use of anticoagulants: Z79.01

## 2014-02-06 HISTORY — DX: Other specified postprocedural states: Z98.890

## 2014-02-06 HISTORY — DX: Hyperlipidemia, unspecified: E78.5

## 2014-02-06 HISTORY — DX: Essential (primary) hypertension: I10

## 2014-02-06 LAB — CBC WITH DIFFERENTIAL/PLATELET
Basophils Absolute: 0 10*3/uL (ref 0.0–0.1)
Basophils Relative: 1 % (ref 0–1)
Eosinophils Absolute: 0.2 10*3/uL (ref 0.0–0.7)
Eosinophils Relative: 3 % (ref 0–5)
HCT: 46.7 % (ref 39.0–52.0)
HEMOGLOBIN: 16 g/dL (ref 13.0–17.0)
LYMPHS ABS: 0.8 10*3/uL (ref 0.7–4.0)
Lymphocytes Relative: 13 % (ref 12–46)
MCH: 31 pg (ref 26.0–34.0)
MCHC: 34.3 g/dL (ref 30.0–36.0)
MCV: 90.5 fL (ref 78.0–100.0)
MONOS PCT: 8 % (ref 3–12)
Monocytes Absolute: 0.5 10*3/uL (ref 0.1–1.0)
NEUTROS ABS: 5 10*3/uL (ref 1.7–7.7)
NEUTROS PCT: 75 % (ref 43–77)
Platelets: 121 10*3/uL — ABNORMAL LOW (ref 150–400)
RBC: 5.16 MIL/uL (ref 4.22–5.81)
RDW: 14.2 % (ref 11.5–15.5)
WBC: 6.7 10*3/uL (ref 4.0–10.5)

## 2014-02-06 LAB — BASIC METABOLIC PANEL
ANION GAP: 15 (ref 5–15)
BUN: 32 mg/dL — AB (ref 6–23)
CHLORIDE: 101 meq/L (ref 96–112)
CO2: 23 mEq/L (ref 19–32)
CREATININE: 1.8 mg/dL — AB (ref 0.50–1.35)
Calcium: 9.3 mg/dL (ref 8.4–10.5)
GFR calc Af Amer: 40 mL/min — ABNORMAL LOW (ref >90)
GFR calc non Af Amer: 34 mL/min — ABNORMAL LOW (ref >90)
Glucose, Bld: 116 mg/dL — ABNORMAL HIGH (ref 70–99)
Potassium: 4.4 mEq/L (ref 3.7–5.3)
Sodium: 139 mEq/L (ref 137–147)

## 2014-02-06 LAB — PROTIME-INR
INR: 2.43 — AB (ref 0.00–1.49)
Prothrombin Time: 26.6 seconds — ABNORMAL HIGH (ref 11.6–15.2)

## 2014-02-06 LAB — PRO B NATRIURETIC PEPTIDE: PRO B NATRI PEPTIDE: 198.9 pg/mL (ref 0–450)

## 2014-02-06 LAB — TROPONIN I: Troponin I: <0.3 ng/mL (ref <0.30)

## 2014-02-06 MED ORDER — DEXTROSE 5 % IV SOLN
500.0000 mg | Freq: Once | INTRAVENOUS | Status: AC
  Administered 2014-02-06: 500 mg via INTRAVENOUS
  Filled 2014-02-06: qty 500

## 2014-02-06 MED ORDER — CEFTRIAXONE SODIUM IN DEXTROSE 20 MG/ML IV SOLN
1.0000 g | INTRAVENOUS | Status: DC
  Filled 2014-02-06: qty 50

## 2014-02-06 MED ORDER — AMIODARONE HCL 200 MG PO TABS
400.0000 mg | ORAL_TABLET | Freq: Every day | ORAL | Status: DC
  Administered 2014-02-07 – 2014-02-08 (×2): 400 mg via ORAL
  Filled 2014-02-06 (×2): qty 2

## 2014-02-06 MED ORDER — PRAVASTATIN SODIUM 40 MG PO TABS
40.0000 mg | ORAL_TABLET | Freq: Every day | ORAL | Status: DC
  Administered 2014-02-07 – 2014-02-09 (×3): 40 mg via ORAL
  Filled 2014-02-06 (×3): qty 1

## 2014-02-06 MED ORDER — HYDRALAZINE HCL 25 MG PO TABS
25.0000 mg | ORAL_TABLET | Freq: Four times a day (QID) | ORAL | Status: DC
  Administered 2014-02-06 – 2014-02-07 (×2): 25 mg via ORAL
  Filled 2014-02-06 (×6): qty 1

## 2014-02-06 MED ORDER — DILTIAZEM HCL 60 MG PO TABS
120.0000 mg | ORAL_TABLET | Freq: Every day | ORAL | Status: DC
  Administered 2014-02-07 – 2014-02-08 (×2): 120 mg via ORAL
  Filled 2014-02-06 (×2): qty 2

## 2014-02-06 MED ORDER — POLYETHYLENE GLYCOL 3350 17 G PO PACK
17.0000 g | PACK | Freq: Every day | ORAL | Status: DC
  Administered 2014-02-07 – 2014-02-08 (×2): 17 g via ORAL
  Filled 2014-02-06 (×3): qty 1

## 2014-02-06 MED ORDER — ASPIRIN EC 81 MG PO TBEC
81.0000 mg | DELAYED_RELEASE_TABLET | Freq: Every day | ORAL | Status: DC
  Administered 2014-02-07 – 2014-02-09 (×3): 81 mg via ORAL
  Filled 2014-02-06 (×3): qty 1

## 2014-02-06 MED ORDER — WARFARIN - PHARMACIST DOSING INPATIENT
Freq: Every day | Status: DC
  Administered 2014-02-07: 18:00:00
  Administered 2014-02-08: 1

## 2014-02-06 MED ORDER — PANTOPRAZOLE SODIUM 40 MG PO TBEC
40.0000 mg | DELAYED_RELEASE_TABLET | Freq: Every day | ORAL | Status: DC
  Administered 2014-02-07 – 2014-02-09 (×3): 40 mg via ORAL
  Filled 2014-02-06 (×3): qty 1

## 2014-02-06 MED ORDER — WARFARIN SODIUM 2.5 MG PO TABS
2.5000 mg | ORAL_TABLET | Freq: Once | ORAL | Status: AC
  Administered 2014-02-06: 2.5 mg via ORAL
  Filled 2014-02-06: qty 1

## 2014-02-06 MED ORDER — AMLODIPINE BESYLATE 10 MG PO TABS: 10.0000 mg | ORAL_TABLET | Freq: Every day | ORAL | Status: DC

## 2014-02-06 MED ORDER — DOCUSATE SODIUM 100 MG PO CAPS
100.0000 mg | ORAL_CAPSULE | Freq: Two times a day (BID) | ORAL | Status: DC
  Administered 2014-02-07 – 2014-02-09 (×5): 100 mg via ORAL
  Filled 2014-02-06 (×6): qty 1

## 2014-02-06 MED ORDER — DEXTROSE 5 % IV SOLN
500.0000 mg | INTRAVENOUS | Status: DC
  Filled 2014-02-06: qty 500

## 2014-02-06 MED ORDER — DEXTROSE 5 % IV SOLN
1.0000 g | Freq: Once | INTRAVENOUS | Status: AC
  Administered 2014-02-06: 1 g via INTRAVENOUS
  Filled 2014-02-06: qty 10

## 2014-02-06 NOTE — Progress Notes (Signed)
ANTICOAGULATION CONSULT NOTE - Initial Consult  Pharmacy Consult for Warfarin Indication: atrial fibrillation  No Known Allergies  Patient Measurements: Height: 5\' 9"  (175.3 cm) Weight: 205 lb 6.4 oz (93.169 kg) IBW/kg (Calculated) : 70.7  Vital Signs: Temp: 97.7 F (36.5 C) (11/21 2136) Temp Source: Oral (11/21 2136) BP: 145/66 mmHg (11/21 2136) Pulse Rate: 84 (11/21 2136)  Labs:  Recent Labs  02/06/14 1800  HGB 16.0  HCT 46.7  PLT 121*  LABPROT 26.6*  INR 2.43*  CREATININE 1.80*  TROPONINI <0.30    Estimated Creatinine Clearance: 37.5 mL/min (by C-G formula based on Cr of 1.8).   Medical History: Past Medical History  Diagnosis Date  . Atrial fibrillation   . Diverticulosis 2002    Colonoscopy  . TIA (transient ischemic attack)   . Hyperlipidemia   . Hypertension   . Hemorrhoids   . Adenomatous colon polyp     Medications:  Prescriptions prior to admission  Medication Sig Dispense Refill Last Dose  . amiodarone (PACERONE) 200 MG tablet Take 400 mg by mouth daily.    02/05/2014 at Unknown time  . amLODipine (NORVASC) 10 MG tablet Take 10 mg by mouth daily.   02/05/2014 at Unknown time  . aspirin EC 81 MG tablet Take 81 mg by mouth daily.   02/06/2014 at Unknown time  . diltiazem (CARDIZEM) 120 MG tablet Take 120 mg by mouth daily.   02/06/2014 at Unknown time  . docusate sodium (COLACE) 100 MG capsule Take 100 mg by mouth 2 (two) times daily.   02/06/2014 at Unknown time  . polyethylene glycol (MIRALAX / GLYCOLAX) packet Take 17 g by mouth daily.   02/06/2014 at Unknown time  . pravastatin (PRAVACHOL) 40 MG tablet Take 40 mg by mouth daily.   02/05/2014 at Unknown time  . warfarin (COUMADIN) 5 MG tablet Take 2.5-5 mg by mouth daily. 2.5mg  daily except on Wed take 5mg    02/05/2014 at Unknown time  . Misc Natural Products (PROSTATE SUPPORT PO) Take 1 tablet by mouth as directed.    uNK  . omeprazole (PRILOSEC) 20 MG capsule Take 20 mg by mouth daily.   Not  started yet  . warfarin (COUMADIN) 2 MG tablet Take 2 mg by mouth daily. Take two tablets once a day      Scheduled:  . [START ON 02/07/2014] amiodarone  400 mg Oral Daily  . [START ON 02/07/2014] aspirin EC  81 mg Oral Daily  . [START ON 02/07/2014] azithromycin  500 mg Intravenous Q24H  . [START ON 02/07/2014] cefTRIAXone (ROCEPHIN)  IV  1 g Intravenous Q24H  . [START ON 02/07/2014] diltiazem  120 mg Oral Daily  . [START ON 02/07/2014] docusate sodium  100 mg Oral BID  . [START ON 02/07/2014] pantoprazole  40 mg Oral Daily  . [START ON 02/07/2014] polyethylene glycol  17 g Oral Daily  . [START ON 02/07/2014] pravastatin  40 mg Oral Daily   Assessment: 80yo male presents with weakness. Pharmacy was consulted to dose warfarin for atrial fibrillation. Patient's home regimen is warfarin 2.5mg  daily and 5mg  every Wednesday. INR on admit is 2.43 and CBC is wnl.  Goal of Therapy:  INR 2-3 Monitor platelets by anticoagulation protocol: Yes   Plan:  Warfarin 2.5mg  PO tonight x1 Daily INR/CBC Continue to monitor H&H and platelets and s/sx of bleeding  Andrey Cota. Diona Foley, PharmD Clinical Pharmacist Pager [phone] 02/06/2014,10:21 PM

## 2014-02-06 NOTE — ED Notes (Signed)
Pt in from home via [facility], per report pt c/o generalized weakness, pt A&O x4, follows commands, speaks in complete sentences, pt reports having a Mitral valve replaced x 10 yrs ago, pt reports recently  Being told by MD at [facility] that his valve is leaking, pt denies CP, SOB, N/V/D,pt takes Coumadin & ASA 81 mg

## 2014-02-06 NOTE — ED Provider Notes (Signed)
CSN: [id number]     Arrival date & time 02/06/14  1708 History   First MD Initiated Contact with Patient 02/06/14 1713     Chief Complaint  Patient presents with  . Fatigue     (Consider location/radiation/quality/duration/timing/severity/associated sxs/prior Treatment) HPI Comments: Intermittent episodes of diffuse weakness, last episode began about 2 hours ago. Intermittent over past week. Feels diffusely weak, no CP, but some mild SOB on exertion.   Patient is a 80 y.o. male presenting with weakness. The history is provided by the patient.  Weakness This is a new problem. The problem occurs daily. The problem has not changed since onset.Associated symptoms include shortness of breath (with exertion). Nothing aggravates the symptoms. Nothing relieves the symptoms.    Past Medical History  Diagnosis Date  . Atrial fibrillation   . Diverticulosis 2002    Colonoscopy  . TIA (transient ischemic attack)   . Hyperlipidemia   . Hypertension   . Hemorrhoids   . Adenomatous colon polyp    Past Surgical History  Procedure Laterality Date  . Coronary artery bypass graft    . Mitral valve repair     No family history on file. History  Substance Use Topics  . Smoking status: Never Smoker   . Smokeless tobacco: Never Used  . Alcohol Use: No    Review of Systems  Constitutional: Negative for fever.  Respiratory: Positive for shortness of breath (with exertion). Negative for cough.   Neurological: Positive for weakness.  All other systems reviewed and are negative.     Allergies  Review of patient's allergies indicates no known allergies.  Home Medications   Prior to Admission medications   Medication Sig Start Date End Date Taking? Authorizing Provider  calcium carbonate (TUMS - DOSED IN MG ELEMENTAL CALCIUM) 500 MG chewable tablet Chew 3 tablets by mouth daily.    Historical Provider, MD  diltiazem (CARDIZEM) 120 MG tablet Take 120 mg by mouth daily.    Historical  Provider, MD  Misc Natural Products (PROSTATE SUPPORT PO) Take by mouth as directed.    Historical Provider, MD  pravastatin (PRAVACHOL) 40 MG tablet Take 40 mg by mouth daily.    Historical Provider, MD  warfarin (COUMADIN) 2 MG tablet Take 2 mg by mouth daily. Take two tablets once a day    Historical Provider, MD   BP 161/69 mmHg  Pulse 70  Temp(Src) 97.6 F (36.4 C) (Oral)  Resp 17  Ht 5\' 9"  (1.753 m)  Wt 200 lb (90.719 kg)  BMI 29.52 kg/m2  SpO2 97% Physical Exam  Constitutional: He is oriented to person, place, and time. He appears well-developed and well-nourished. No distress.  HENT:  Head: Normocephalic and atraumatic.  Mouth/Throat: No oropharyngeal exudate.  Eyes: EOM are normal. Pupils are equal, round, and reactive to light.  Neck: Normal range of motion. Neck supple. JVD present.  Cardiovascular: Normal rate and regular rhythm.  Exam reveals no friction rub.   Murmur heard.  Systolic murmur is present with a grade of 3/6  Pulmonary/Chest: Effort normal and breath sounds normal. No respiratory distress. He has no wheezes. He has no rales. He exhibits no tenderness.  Abdominal: He exhibits no distension. There is no tenderness. There is no rebound.  Musculoskeletal: Normal range of motion. He exhibits no edema.  Neurological: He is alert and oriented to person, place, and time.  Skin: He is not diaphoretic.  Nursing note and vitals reviewed.   ED Course  Procedures (including critical care  time) Labs Review Labs Reviewed  CBC WITH DIFFERENTIAL  BASIC METABOLIC PANEL  TROPONIN I  PROTIME-INR  PRO B NATRIURETIC PEPTIDE    Imaging Review Dg Chest Port 1 View  02/06/2014   CLINICAL DATA:  Positive weakness.  Short of breath.  EXAM: PORTABLE CHEST - 1 VIEW  COMPARISON:  Radiograph 9 6,012  FINDINGS: Sternotomy wires overlie the large cardiac silhouette. There is central venous pulmonary congestion and mild pulmonary edema. Bilateral pleural effusions. There is  left basilar opacity.  IMPRESSION: Cardiomegaly and mild pulmonary edema suggest congestive heart failure.  Left basilar opacity representing atelectasis or infiltrate.   Electronically Signed   By: Suzy Bouchard M.D.   On: 02/06/2014 17:48     EKG Interpretation   Date/Time:  Saturday February 06 2014 17:13:02 EST Ventricular Rate:  76 PR Interval:  213 QRS Duration: 134 QT Interval:  432 QTC Calculation: 486 R Axis:   98 Text Interpretation:  Sinus rhythm Borderline prolonged PR interval  Nonspecific intraventricular conduction delay Similar to prior Confirmed  by Mingo Amber  MD, Cross ([id number]) on 02/06/2014 5:42:46 PM      MDM   Final diagnoses:  CAP (community acquired pneumonia)  Weakness    80 year old male with history of a artificial mitral valve, hypertension, A. fib on Coumadin presents with weakness. Has had multiple events this week of acute onset weakness. Described as diffuse. He has some exertional labored respirations but no chest pain or shortness of breath at rest. Patient's current episode began about 2 hours ago and he states this is the worst is had. Here vitals are stable. Lungs are clear. He does have a 3/6 systolic heart murmur. He does have known mitral regurgitation, he is followed by cardiology at the [facility], but his valve repair was done here. Will check labs, chest xray. JVD noted on exam, concern for possible valve issue. CXR shows pneumonia, BNP not elevated. CAP antibiotics started. Admitted.  Evelina Bucy, MD 02/06/14 2040

## 2014-02-07 ENCOUNTER — Encounter (HOSPITAL_COMMUNITY): Payer: Self-pay | Admitting: Internal Medicine

## 2014-02-07 ENCOUNTER — Inpatient Hospital Stay (HOSPITAL_COMMUNITY): Payer: Medicare Other

## 2014-02-07 DIAGNOSIS — R531 Weakness: Secondary | ICD-10-CM

## 2014-02-07 DIAGNOSIS — I5043 Acute on chronic combined systolic (congestive) and diastolic (congestive) heart failure: Secondary | ICD-10-CM | POA: Diagnosis present

## 2014-02-07 DIAGNOSIS — Z9889 Other specified postprocedural states: Secondary | ICD-10-CM

## 2014-02-07 DIAGNOSIS — E785 Hyperlipidemia, unspecified: Secondary | ICD-10-CM | POA: Diagnosis present

## 2014-02-07 DIAGNOSIS — Z7901 Long term (current) use of anticoagulants: Secondary | ICD-10-CM

## 2014-02-07 DIAGNOSIS — R918 Other nonspecific abnormal finding of lung field: Secondary | ICD-10-CM

## 2014-02-07 DIAGNOSIS — I4821 Permanent atrial fibrillation: Secondary | ICD-10-CM | POA: Diagnosis present

## 2014-02-07 DIAGNOSIS — I359 Nonrheumatic aortic valve disorder, unspecified: Secondary | ICD-10-CM

## 2014-02-07 DIAGNOSIS — I10 Essential (primary) hypertension: Secondary | ICD-10-CM | POA: Diagnosis present

## 2014-02-07 DIAGNOSIS — I5042 Chronic combined systolic (congestive) and diastolic (congestive) heart failure: Secondary | ICD-10-CM | POA: Diagnosis present

## 2014-02-07 LAB — COMPREHENSIVE METABOLIC PANEL
ALT: 35 U/L (ref 0–53)
AST: 26 U/L (ref 0–37)
Albumin: 3.9 g/dL (ref 3.5–5.2)
Alkaline Phosphatase: 64 U/L (ref 39–117)
Anion gap: 14 (ref 5–15)
BUN: 31 mg/dL — ABNORMAL HIGH (ref 6–23)
CALCIUM: 8.9 mg/dL (ref 8.4–10.5)
CO2: 22 mEq/L (ref 19–32)
CREATININE: 1.6 mg/dL — AB (ref 0.50–1.35)
Chloride: 103 mEq/L (ref 96–112)
GFR calc Af Amer: 46 mL/min — ABNORMAL LOW (ref >90)
GFR calc non Af Amer: 39 mL/min — ABNORMAL LOW (ref >90)
GLUCOSE: 99 mg/dL (ref 70–99)
Potassium: 4.3 mEq/L (ref 3.7–5.3)
SODIUM: 139 meq/L (ref 137–147)
TOTAL PROTEIN: 7.1 g/dL (ref 6.0–8.3)
Total Bilirubin: 0.3 mg/dL (ref 0.3–1.2)

## 2014-02-07 LAB — PROTIME-INR
INR: 2.48 — ABNORMAL HIGH (ref 0.00–1.49)
PROTHROMBIN TIME: 27 s — AB (ref 11.6–15.2)

## 2014-02-07 LAB — CBC WITH DIFFERENTIAL/PLATELET
Basophils Absolute: 0 10*3/uL (ref 0.0–0.1)
Basophils Relative: 1 % (ref 0–1)
EOS ABS: 0.3 10*3/uL (ref 0.0–0.7)
EOS PCT: 5 % (ref 0–5)
HCT: 45.7 % (ref 39.0–52.0)
Hemoglobin: 15.6 g/dL (ref 13.0–17.0)
LYMPHS ABS: 1.1 10*3/uL (ref 0.7–4.0)
Lymphocytes Relative: 19 % (ref 12–46)
MCH: 30.8 pg (ref 26.0–34.0)
MCHC: 34.1 g/dL (ref 30.0–36.0)
MCV: 90.1 fL (ref 78.0–100.0)
Monocytes Absolute: 0.6 10*3/uL (ref 0.1–1.0)
Monocytes Relative: 10 % (ref 3–12)
Neutro Abs: 3.7 10*3/uL (ref 1.7–7.7)
Neutrophils Relative %: 65 % (ref 43–77)
Platelets: 130 10*3/uL — ABNORMAL LOW (ref 150–400)
RBC: 5.07 MIL/uL (ref 4.22–5.81)
RDW: 14.4 % (ref 11.5–15.5)
WBC: 5.7 10*3/uL (ref 4.0–10.5)

## 2014-02-07 LAB — TROPONIN I: Troponin I: <0.3 ng/mL (ref <0.30)

## 2014-02-07 LAB — TSH: TSH: 25.31 u[IU]/mL — ABNORMAL HIGH (ref 0.350–4.500)

## 2014-02-07 LAB — T4, FREE: FREE T4: 0.88 ng/dL (ref 0.80–1.80)

## 2014-02-07 MED ORDER — HYDRALAZINE HCL 10 MG PO TABS
10.0000 mg | ORAL_TABLET | Freq: Four times a day (QID) | ORAL | Status: DC
  Administered 2014-02-07 – 2014-02-08 (×4): 10 mg via ORAL
  Filled 2014-02-07 (×9): qty 1

## 2014-02-07 MED ORDER — WARFARIN SODIUM 2.5 MG PO TABS
2.5000 mg | ORAL_TABLET | Freq: Once | ORAL | Status: AC
  Administered 2014-02-07: 2.5 mg via ORAL
  Filled 2014-02-07: qty 1

## 2014-02-07 MED ORDER — FUROSEMIDE 40 MG PO TABS
40.0000 mg | ORAL_TABLET | Freq: Every day | ORAL | Status: DC
  Administered 2014-02-07 – 2014-02-08 (×2): 40 mg via ORAL
  Filled 2014-02-07 (×3): qty 1

## 2014-02-07 MED ORDER — FUROSEMIDE 10 MG/ML IJ SOLN: 40.0000 mg | Freq: Once | INTRAMUSCULAR | Status: DC

## 2014-02-07 NOTE — Progress Notes (Addendum)
[facility] Progress Note   [name] [id number] DOB: October 30, 1931 DOA: 02/06/2014 PCP: [name], MD  Brief narrative: [name] is a 80 y.o. male past medical history of chronic A. fib, dyslipidemia, hypertension, mitral valve repair 10 years ago and diastolic heart failure who presents with generalized weakness and tiredness and is found to have bilateral infiltrates on chest x-ray. He states that he had an appointment with a cardiologist at the [facility] on 01/20/2014 and was placed on amiodarone. He was told that his mitral valve was leaking and according to his wife there was some discussion about performing a TEE. A follow-up visit was planned for January however they feel that they need an earlier visit with a cardiologist and have one scheduled with Dr. Radford Pax for 12/15.   Subjective: The patient states that his symptom of weakness improved significantly while in the ER and he continues to feel well. He does not complain of any shortness of breath cough or fevers or chest pain.  Assessment/Plan: Principal Problem:   Generalized weakness - resolved very quickly and now states he feels back to normal -TSH significantly elevated- check free T4- if he is hypothyroid, this may be secondary to amiodarone - I still doubt his weakness would have resolved this quickly if it was related to hypothyoidism - check pulse ox with ambulation to ensure he is not becoming hypoxic on exertion and resulting in his c/o weakness  Active Problems:   Pulmonary infiltrates on CXR - no symptoms of pneumonia- d/c antibiotics - ProBNP not suggestive of CHF- infiltrates may be related to Amio toxicity? - will obtain high resolution CT chest which will need to be with out IV contrast due to poor renal clearance but should be sufficient to diagnose parenchymal disease.     H/O mitral valve repair--  Chronic diastolic heart failure - obtain ECHO to assess degree of regurgitation     Chronic  atrial fibrillation - on Amio and Cardizem daily-  - cont Warfarin    Essential hypertension - stable on home meds    Dyslipidemia - cont Statin  Chronic mild thrombocytopenia - stable   Code Status: Full code Family Communication: Family at bedside Disposition Plan: Home DVT prophylaxis: Warfarin  Consultants: Cardiology   Procedures: none  Antibiotics: Anti-infectives    Start     Dose/Rate Route Frequency Ordered Stop   02/07/14 2000  azithromycin (ZITHROMAX) 500 mg in dextrose 5 % 250 mL IVPB  Status:  Discontinued     500 mg250 mL/hr over 60 Minutes Intravenous Every 24 hours 02/06/14 2215 02/07/14 0848   02/07/14 1900  cefTRIAXone (ROCEPHIN) 1 g in dextrose 5 % 50 mL IVPB - Premix  Status:  Discontinued     1 g100 mL/hr over 30 Minutes Intravenous Every 24 hours 02/06/14 2215 02/07/14 0848   02/06/14 1915  cefTRIAXone (ROCEPHIN) 1 g in dextrose 5 % 50 mL IVPB     1 g100 mL/hr over 30 Minutes Intravenous  Once 02/06/14 1906 02/06/14 2022   02/06/14 1915  azithromycin (ZITHROMAX) 500 mg in dextrose 5 % 250 mL IVPB     500 mg250 mL/hr over 60 Minutes Intravenous  Once 02/06/14 1906 02/06/14 2123         Objective: Filed Weights   02/06/14 1716 02/06/14 2136 02/07/14 0456  Weight: 90.719 kg (200 lb) 93.169 kg (205 lb 6.4 oz) 92.6 kg (204 lb 2.3 oz)    Intake/Output Summary (Last 24 hours) at 02/07/14 1051 Last  data filed at 02/07/14 0843  Gross per 24 hour  Intake    410 ml  Output    950 ml  Net   -540 ml     Vitals Filed Vitals:   02/06/14 2045 02/06/14 2136 02/07/14 0456 02/07/14 1049  BP: 150/69 145/66 137/69 142/70  Pulse: 71 84 69   Temp:  97.7 F (36.5 C) 98.3 F (36.8 C)   TempSrc:  Oral Oral   Resp: 19 20 20    Height:      Weight:  93.169 kg (205 lb 6.4 oz) 92.6 kg (204 lb 2.3 oz)   SpO2: 96% 98% 96%     Exam: General: No acute respiratory distress Lungs: crackles in b/l bases L > R Cardiovascular: Regular rate and rhythm without  murmur gallop or rub normal S1 and S2 Abdomen: Nontender, nondistended, soft, bowel sounds positive, no rebound, no ascites, no appreciable mass Extremities: No significant cyanosis, clubbing, or edema bilateral lower extremities  Data Reviewed: Basic Metabolic Panel:  Recent Labs Lab 02/06/14 1800 02/07/14 0606  NA 139 139  K 4.4 4.3  CL 101 103  CO2 23 22  GLUCOSE 116* 99  BUN 32* 31*  CREATININE 1.80* 1.60*  CALCIUM 9.3 8.9   Liver Function Tests:  Recent Labs Lab 02/07/14 0606  AST 26  ALT 35  ALKPHOS 64  BILITOT 0.3  PROT 7.1  ALBUMIN 3.9   No results for input(s): LIPASE, AMYLASE in the last 168 hours. No results for input(s): AMMONIA in the last 168 hours. CBC:  Recent Labs Lab 02/06/14 1800 02/07/14 0606  WBC 6.7 5.7  NEUTROABS 5.0 3.7  HGB 16.0 15.6  HCT 46.7 45.7  MCV 90.5 90.1  PLT 121* 130*   Cardiac Enzymes:  Recent Labs Lab 02/06/14 1800 02/07/14 0045 02/07/14 0606  TROPONINI <0.30 <0.30 <0.30   BNP (last 3 results)  Recent Labs  02/06/14 1800  PROBNP 198.9   CBG: No results for input(s): GLUCAP in the last 168 hours.  No results found for this or any previous visit (from the past 240 hour(s)).   Studies:  Recent x-ray studies have been reviewed in detail by the Attending Physician  Scheduled Meds:  Scheduled Meds: . amiodarone  400 mg Oral Daily  . aspirin EC  81 mg Oral Daily  . diltiazem  120 mg Oral Daily  . docusate sodium  100 mg Oral BID  . hydrALAZINE  10 mg Oral 4 times per day  . pantoprazole  40 mg Oral Daily  . polyethylene glycol  17 g Oral Daily  . pravastatin  40 mg Oral Daily  . Warfarin - Pharmacist Dosing Inpatient   Does not apply q1800   Continuous Infusions:   Time spent on care of this patient: 35 min   [place], MD 02/07/2014, 10:51 AM  LOS: 1 day   [facility] Office  [phone] Pager - Text Page per [email or web address]  If 7PM-7AM, please contact  night-coverage [email or web address]

## 2014-02-07 NOTE — Progress Notes (Signed)
ANTICOAGULATION CONSULT NOTE - Initial Consult  Pharmacy Consult for Warfarin Indication: atrial fibrillation  No Known Allergies  Patient Measurements: Height: 5\' 9"  (175.3 cm) Weight: 204 lb 2.3 oz (92.6 kg) IBW/kg (Calculated) : 70.7  Vital Signs: Temp: 98.3 F (36.8 C) (11/22 0456) Temp Source: Oral (11/22 0456) BP: 142/70 mmHg (11/22 1049) Pulse Rate: 69 (11/22 0456)  Labs:  Recent Labs  02/06/14 1800 02/07/14 0045 02/07/14 0606  HGB 16.0  --  15.6  HCT 46.7  --  45.7  PLT 121*  --  130*  LABPROT 26.6*  --  27.0*  INR 2.43*  --  2.48*  CREATININE 1.80*  --  1.60*  TROPONINI <0.30 <0.30 <0.30    Estimated Creatinine Clearance: 42.1 mL/min (by C-G formula based on Cr of 1.6).   Medical History: Past Medical History  Diagnosis Date  . Atrial fibrillation   . Diverticulosis 2002    Colonoscopy  . TIA (transient ischemic attack)   . Hyperlipidemia   . Hypertension   . Hemorrhoids   . Adenomatous colon polyp   . Chronic atrial fibrillation 02/07/2014  . H/O mitral valve repair 02/07/2014  . Chronic diastolic heart failure 0000000  . Essential hypertension 02/07/2014  . Dyslipidemia 02/07/2014  . Current use of long term anticoagulation 02/07/2014    Medications:  Prescriptions prior to admission  Medication Sig Dispense Refill Last Dose  . amiodarone (PACERONE) 200 MG tablet Take 400 mg by mouth daily.    02/05/2014 at Unknown time  . amLODipine (NORVASC) 10 MG tablet Take 10 mg by mouth daily.   02/05/2014 at Unknown time  . aspirin EC 81 MG tablet Take 81 mg by mouth daily.   02/06/2014 at Unknown time  . diltiazem (CARDIZEM) 120 MG tablet Take 120 mg by mouth daily.   02/06/2014 at Unknown time  . docusate sodium (COLACE) 100 MG capsule Take 100 mg by mouth 2 (two) times daily.   02/06/2014 at Unknown time  . polyethylene glycol (MIRALAX / GLYCOLAX) packet Take 17 g by mouth daily.   02/06/2014 at Unknown time  . pravastatin (PRAVACHOL) 40 MG  tablet Take 40 mg by mouth daily.   02/05/2014 at Unknown time  . warfarin (COUMADIN) 5 MG tablet Take 2.5-5 mg by mouth daily. 2.5mg  daily except on Wed take 5mg    02/05/2014 at Unknown time  . Misc Natural Products (PROSTATE SUPPORT PO) Take 1 tablet by mouth as directed.    uNK  . omeprazole (PRILOSEC) 20 MG capsule Take 20 mg by mouth daily.   Not started yet  . warfarin (COUMADIN) 2 MG tablet Take 2 mg by mouth daily. Take two tablets once a day      Scheduled:  . amiodarone  400 mg Oral Daily  . aspirin EC  81 mg Oral Daily  . diltiazem  120 mg Oral Daily  . docusate sodium  100 mg Oral BID  . hydrALAZINE  10 mg Oral 4 times per day  . pantoprazole  40 mg Oral Daily  . polyethylene glycol  17 g Oral Daily  . pravastatin  40 mg Oral Daily  . Warfarin - Pharmacist Dosing Inpatient   Does not apply q1800   Assessment: 80yo male presents with weakness. Pharmacy was consulted to dose warfarin for atrial fibrillation. Patient's home regimen is warfarin 2.5mg  daily and 5mg  every Wednesday. INR on admit is 2.43 and hgb is wnl.  INR this morning remains therapeutic at 2.48 and hgb is wnl.  Goal of Therapy:  INR 2-3 Monitor platelets by anticoagulation protocol: Yes   Plan:  Warfarin 2.5mg  PO tonight x1 Daily INR/CBC Continue to monitor H&H and platelets and s/sx of bleeding  Andrey Cota. Diona Foley, PharmD Clinical Pharmacist Pager [phone] 02/07/2014,1:33 PM

## 2014-02-07 NOTE — Progress Notes (Signed)
SATURATION QUALIFICATIONS: (This note is used to comply with regulatory documentation for home oxygen)  Patient Saturations on Room Air at Rest = 98%  Patient Saturations on Room Air while Ambulating = 96%  Patient Saturations on 0 Liters of oxygen while Ambulating = 96 %  Please briefly explain why patient needs home oxygen:  Remains stable on room air.

## 2014-02-07 NOTE — Progress Notes (Signed)
Utilization Review Completed.[name] T11/22/2015  

## 2014-02-07 NOTE — Progress Notes (Signed)
Echo Lab  2D Echocardiogram completed.  [place], [place] 02/07/2014 12:27 PM

## 2014-02-07 NOTE — Plan of Care (Signed)
Problem: Phase I Progression Outcomes Goal: OOB as tolerated unless otherwise ordered Outcome: Adequate for Discharge Ambulated in hallway without difficulty with nursing tech per order.

## 2014-02-07 NOTE — H&P (Signed)
[facility] History and Physical  Patient: [name]  [id number]  DOB: [birth year]  DOS: the patient was seen and examined on 02/06/2014 PCP: [name], MD  Chief Complaint: Generalized weakness and tiredness  HPI: [name] is a 80 y.o. male with Past medical history of chronic A. fib, dyslipidemia, essential hypertension, chronic diastolic heart failure, history of mitral valve repair, on Coumadin. The patient is presenting with complaints of progressively worsening generalized weakness. He mentions his symptoms as weakness and tiredness as well as fatigue. He has noted the symptoms are early in August and was admitted in [facility] At this time he mentions she has undergone echocardiogram and was told that he has irregular heart rhythm for which she was planned to undergo cardioversion and then was placed on amiodarone and discharged home. He continues to have progressively worsening generalized weakness. But since last 2 days he has worsening symptoms and today he was unable to move or do any activity due to tiredness. There was no fall trauma head injury reported. There was no dizziness or lightheadedness blurring of the vision speech difficulty swallowing difficulty focal deficit incontinence of bowel or bladder reported. Patient denied any complaint of nausea, vomiting, chest pain, shortness of breath, fever, chills, cough, abdominal pain, diarrhea, constipation, active bleeding or any burning urination. With this complains the patient presented to ER due to his severe generalized weakness and at the time of my evaluation mentions that after receiving antibiotic he is feeling back to his baseline and does not have any acute complaints. He denies any ongoing smoking or oxygen use.  The patient is coming from home. And at his baseline independent for most of his ADL.  Review of Systems: as mentioned in the history of present illness.  A  Comprehensive review of the other systems is negative.  Past Medical History  Diagnosis Date  . Atrial fibrillation   . Diverticulosis 2002    Colonoscopy  . TIA (transient ischemic attack)   . Hyperlipidemia   . Hypertension   . Hemorrhoids   . Adenomatous colon polyp   . Chronic atrial fibrillation 02/07/2014  . H/O mitral valve repair 02/07/2014  . Chronic diastolic heart failure 0000000  . Essential hypertension 02/07/2014  . Dyslipidemia 02/07/2014  . Current use of long term anticoagulation 02/07/2014   Past Surgical History  Procedure Laterality Date  . Coronary artery bypass graft    . Mitral valve repair     Social History:  reports that he has never smoked. He has never used smokeless tobacco. He reports that he does not drink alcohol or use illicit drugs.  No Known Allergies  History reviewed. No pertinent family history.  Prior to Admission medications   Medication Sig Start Date End Date Taking? Authorizing Provider  amiodarone (PACERONE) 200 MG tablet Take 400 mg by mouth daily.    Yes Historical Provider, MD  amLODipine (NORVASC) 10 MG tablet Take 10 mg by mouth daily.   Yes Historical Provider, MD  aspirin EC 81 MG tablet Take 81 mg by mouth daily.   Yes Historical Provider, MD  diltiazem (CARDIZEM) 120 MG tablet Take 120 mg by mouth daily.   Yes Historical Provider, MD  docusate sodium (COLACE) 100 MG capsule Take 100 mg by mouth 2 (two) times daily.   Yes Historical Provider, MD  polyethylene glycol (MIRALAX / GLYCOLAX) packet Take 17 g by mouth daily.   Yes Historical Provider, MD  pravastatin (PRAVACHOL) 40 MG tablet Take  40 mg by mouth daily.   Yes Historical Provider, MD  warfarin (COUMADIN) 5 MG tablet Take 2.5-5 mg by mouth daily. 2.5mg  daily except on Wed take 5mg    Yes Historical Provider, MD  Misc Natural Products (PROSTATE SUPPORT PO) Take 1 tablet by mouth as directed.     Historical Provider, MD  omeprazole (PRILOSEC) 20 MG capsule Take 20 mg  by mouth daily.    Historical Provider, MD  warfarin (COUMADIN) 2 MG tablet Take 2 mg by mouth daily. Take two tablets once a day    Historical Provider, MD    Physical Exam: Filed Vitals:   02/06/14 1915 02/06/14 2000 02/06/14 2045 02/06/14 2136  BP: 154/74 148/72 150/69 145/66  Pulse: 69 66 71 84  Temp:    97.7 F (36.5 C)  TempSrc:    Oral  Resp: 21 19 19 20   Height:      Weight:    93.169 kg (205 lb 6.4 oz)  SpO2: 97% 96% 96% 98%    General: Alert, Awake and Oriented to Time, Place and Person. Appear in mild distress Eyes: PERRL ENT: Oral Mucosa clear moist. Neck: No JVD Cardiovascular: S1 and S2 Present, aortic systolic Murmur, Peripheral Pulses Present Respiratory: Bilateral Air entry equal and Decreased, left basal Crackles, no wheezes Abdomen: Bowel Sound ,  Present Soft and non tender Skin: no Rash Extremities: Trace bilateral Pedal edema, no calf tenderness Neurologic: Grossly no focal neuro deficit.  Labs on Admission:  CBC:  Recent Labs Lab 02/06/14 1800  WBC 6.7  NEUTROABS 5.0  HGB 16.0  HCT 46.7  MCV 90.5  PLT 121*    CMP     Component Value Date/Time   NA 139 02/06/2014 1800   K 4.4 02/06/2014 1800   CL 101 02/06/2014 1800   CO2 23 02/06/2014 1800   GLUCOSE 116* 02/06/2014 1800   BUN 32* 02/06/2014 1800   CREATININE 1.80* 02/06/2014 1800   CALCIUM 9.3 02/06/2014 1800   PROT 6.6 11/23/2010 2214   ALBUMIN 3.9 11/23/2010 2214   AST 23 11/23/2010 2214   ALT 21 11/23/2010 2214   ALKPHOS 64 11/23/2010 2214   BILITOT 0.3 11/23/2010 2214   GFRNONAA 34* 02/06/2014 1800   GFRAA 40* 02/06/2014 1800    No results for input(s): LIPASE, AMYLASE in the last 168 hours. No results for input(s): AMMONIA in the last 168 hours.   Recent Labs Lab 02/06/14 1800 02/07/14 0045  TROPONINI <0.30 <0.30   BNP (last 3 results)  Recent Labs  02/06/14 1800  PROBNP 198.9    Radiological Exams on Admission: Dg Chest Port 1 View  02/06/2014   CLINICAL  DATA:  Positive weakness.  Short of breath.  EXAM: PORTABLE CHEST - 1 VIEW  COMPARISON:  Radiograph 9 6,012  FINDINGS: Sternotomy wires overlie the large cardiac silhouette. There is central venous pulmonary congestion and mild pulmonary edema. Bilateral pleural effusions. There is left basilar opacity.  IMPRESSION: Cardiomegaly and mild pulmonary edema suggest congestive heart failure.  Left basilar opacity representing atelectasis or infiltrate.   Electronically Signed   By: Suzy Bouchard M.D.   On: 02/06/2014 17:48   EKG: Independently reviewed. normal sinus rhythm, nonspecific ST and T waves changes.  Assessment/Plan Principal Problem:   CAP (community acquired pneumonia) Active Problems:   Weakness   H/O mitral valve repair   Chronic atrial fibrillation   Chronic diastolic heart failure   Essential hypertension   Dyslipidemia   Current use of long term  anticoagulation   1. CAP (community acquired pneumonia)  the patient is presenting with complaints of progressively worsening generalized weakness. He denied any other symptoms but his chest x-rays showing left basilar opacity and he appears to have left-sided crackles on examination. With this the patient is currently being admitted in the hospital on telemetry unit. I would continue treating him with ceftriaxone and azithromycin. Follow sputum cultures urine antigens as well as monitor him closely and obtain blood culture if that patient spikes a fever.  2.Chronic diastolic heart failure. Essential hypertension. Chronic A. fib. Patient also has possible acute on chronic diastolic heart failure. Although proBNP is not elevated his weight is 5 pounds up than his baseline one week ago. Patient is appearing to have elevated serum creatinine and we don't know patient's baseline. With this currently I would monitor his ins and outs place him on oral hydralazine and if his serum creatinine remains study then provide him IV Lasix in the  morning. Patient has an echocardiogram performed in [place] 2 months ago and is scheduled to be seen by cardiologist on December 15. Currently I will obtain serial troponin to rule out ACS monitor him on telemetry to rule out any arrhythmia and during the day and try to obtain records from [place] for his echocardiogram. Based on this workup patient may need cardiology consultation. Patient is due to see Dr. Radford Pax. Continue amiodarone and Cardizem as well as Coumadin.  3. Essential hypertension. The patient is on hold amlodipine as well as Cardizem at present I would hold amlodipine and placing him on hydralazine. Continue with other medications and close monitoring.  4.Generalized weakness. Most likely etiology is community acquired pneumonia. Possibility of acute on chronic diastolic heart failure cannot be ruled out. We will check TSH and monitor serial troponin to sudden onset of symptoms. May require PTOT consultation in morning   Advance goals of care discussion:  full code   DVT Prophylaxis: Chronic anticoagulation  Nutrition:  cardiac diet  Family Communication:  family was present at bedside, opportunity was given to ask question and all questions were answered satisfactorily at the time of interview. Disposition: Admitted to inpatient in telemetry unit.  Author: Berle Mull, MD [facility] Pager: [phone]  If 7PM-7AM, please contact night-coverage [email or web address] Password [email or web address]

## 2014-02-07 NOTE — Consult Note (Addendum)
CARDIOLOGY CONSULT NOTE   Patient ID: Javae Hathcoat MRN: [id number] DOB/AGE: 06/18/1931 80 y.o.  Admit Date: 02/06/2014 Referring Physician: Berle Mull MD Internal Medicine  Primary Physician: [name], MD Consulting Cardiologist: Wynonia Lawman, MD Primary Cardiologist: Fransico Him MD (Scheduled to be established in Dec) Reason for Consultation: Diastolic CHF, with Hx of Chronic Atrial fib  Clinical Summary Mr. Gerges is a 80 y.o.male who is normally followed by the [place] in [place], with known history of CAD with single-vessel coronary artery bypass grafting. Dr. Pia Mau, unknown vessel, mitral valve annuloplasty, TIA's paroxysmal atrial fibrillation on coumadin,  hypertension, hyperlipidemia, who presented to the emergency room after sudden onset of generalized weakness, fatigue, difficulty taking deep breaths.  Chest x-ray revealed cardiomegaly with mild pulmonary edema and CHF. He denied chest pain, edema, significant weight gain, nausea, vomiting or diaphoresis.  On arrival to the emergency room blood pressure was 171/73, heart rate 77, O2 98% on room air. He was found to have some mild thrombocytopenia with platelets of 121, creatinine 1.8, glucose 116. GFR 34. Troponins are negative 3. INR 2.43. ProBNP 198.9. EKG revealing normal sinus rhythm with prolonged PR interval of 0.22, with intraventricular conduction delay. He was treated with Rocephin 1g and azithromycin. He was not placed on diuretics.  Cardiology has been consulted to evaluate for CHF and symptoms of weakness with known hx of CAD and PAF.   Patient said he had one prior episode in October, which occurred with generalized weakness, fatigue and diaphoresis. He stated this occurred after having a picnic outside and moving the grill at the end of the day. He went inside and recovered without any further symptoms. [facility] and [facility]. He was told he had atrial fib, and placed on amiodarone.  He has a appointment scheduled with Dr. Fransico Him, December 15 of this year to be established with local cardiologist.   No Known Allergies  Medications Scheduled Medications: . amiodarone  400 mg Oral Daily  . aspirin EC  81 mg Oral Daily  . diltiazem  120 mg Oral Daily  . docusate sodium  100 mg Oral BID  . hydrALAZINE  10 mg Oral 4 times per day  . pantoprazole  40 mg Oral Daily  . polyethylene glycol  17 g Oral Daily  . pravastatin  40 mg Oral Daily  . Warfarin - Pharmacist Dosing Inpatient   Does not apply q1800    Past Medical History  Diagnosis Date  . Atrial fibrillation   . Diverticulosis 2002    Colonoscopy  . TIA (transient ischemic attack)   . Hyperlipidemia   . Hypertension   . Hemorrhoids   . Adenomatous colon polyp   . Chronic atrial fibrillation 02/07/2014  . H/O mitral valve repair 02/07/2014  . Chronic diastolic heart failure 0000000  . Essential hypertension 02/07/2014  . Dyslipidemia 02/07/2014  . Current use of long term anticoagulation 02/07/2014    Past Surgical History  Procedure Laterality Date  . Coronary artery bypass graft    . Mitral valve repair      Family History  Problem Relation Age of Onset  . Heart failure Brother     Deceased  . Stroke Mother     Deceased  . Heart disease Father     Deceased    Social History Mr. Letizia reports that he has never smoked. He has never used smokeless tobacco. Mr. Birkeland reports that he does not drink alcohol.  Review of Systems  Complete review of systems are found to be negative unless outlined in H&P above.  Physical Examination Blood pressure 142/70, pulse 69, temperature 98.3 F (36.8 C), temperature source Oral, resp. rate 20, height 5\' 9"  (1.753 m), weight 204 lb 2.3 oz (92.6 kg), SpO2 96 %.  Intake/Output Summary (Last 24 hours) at 02/07/14 1058 Last data filed at 02/07/14 0843  Gross per 24 hour  Intake    410 ml  Output    950 ml  Net   -540 ml    Telemetry: NSR  rate in the 70's.   GEN: Pleasant, mildly obese white male in no acute distress HEENT: Conjunctiva and lids normal, oropharynx clear with moist mucosa. Neck: Supple, no elevated JVP or carotid bruits, no thyromegaly. Lungs: Clear to auscultation, nonlabored breathing at rest.  Healed median sternotomy scar noted Cardiac: Regular rate and rhythm, 2/6 holosystolic murmur with preserved S2  Abdomen: Soft, non-tender, obese, no hepatomegaly, bowel sounds present, no guarding or rebound. Extremities: 1+ bilateral pre-tibal and ankle pitting edema, distal pulses 2+. Skin: Warm and dry. Musculoskeletal: Changes of chronic venous stasis noted.  1+ edema noted Neuropsychiatric: Alert and oriented x3, affect grossly appropriate.  Lab Results  Basic Metabolic Panel:  Recent Labs Lab 02/06/14 1800 02/07/14 0606  NA 139 139  K 4.4 4.3  CL 101 103  CO2 23 22  GLUCOSE 116* 99  BUN 32* 31*  CREATININE 1.80* 1.60*  CALCIUM 9.3 8.9    Liver Function Tests:  Recent Labs Lab 02/07/14 0606  AST 26  ALT 35  ALKPHOS 64  BILITOT 0.3  PROT 7.1  ALBUMIN 3.9    CBC:  Recent Labs Lab 02/06/14 1800 02/07/14 0606  WBC 6.7 5.7  NEUTROABS 5.0 3.7  HGB 16.0 15.6  HCT 46.7 45.7  MCV 90.5 90.1  PLT 121* 130*    Cardiac Enzymes:  Recent Labs Lab 02/06/14 1800 02/07/14 0045 02/07/14 0606  TROPONINI <0.30 <0.30 <0.30     Radiology: Dg Chest Port 1 View  02/06/2014   CLINICAL DATA:  Positive weakness.  Short of breath.  EXAM: PORTABLE CHEST - 1 VIEW  COMPARISON:  Radiograph 9 6,012  FINDINGS: Sternotomy wires overlie the large cardiac silhouette. There is central venous pulmonary congestion and mild pulmonary edema. Bilateral pleural effusions. There is left basilar opacity.  IMPRESSION: Cardiomegaly and mild pulmonary edema suggest congestive heart failure.  Left basilar opacity representing atelectasis or infiltrate.   Electronically Signed   By: Suzy Bouchard M.D.   On:  02/06/2014 17:48    ECG: Normal sinus rhythm, first-degree AV block, with intraventricular conduction delay.   Impression and Recommendations  1.  Pulmonary infiltrates on chest x-ray: Echocardiogram is ordered and shows mild aortic stenosis and mild mitral regurgitation.  EF was 50-55%.  The patient denied any shortness of breath, chest pain, lower extremity edema or excessive weight gain. Most recent echo in 2012 demonstrated normal LV function. He states his weight is up approximately 10 pounds. Lung sounds are clear. Without evidence of wheezing or coughing.   Another possibility would be whether or not he has early onset of amiodarone lung toxicity.  2. Hx of CAD with mitral valve annuloplasty:  Patient has a history of single-vessel coronary bypass grafting by Dr. Servando Snare, along with a mitral valve annuloplasty. We will request records from medical records as they are not found in [place] in order to have better understanding of bypass graft location, and dates of surgery. (On review of encounters  section, it appears this occurred in 2004).  He is currently on statin therapy along with aspirin 81 mg daily. He is not on a beta blocker, he is on amiodarone and diltiazem.  3. PAF: Patient currently in normal sinus rhythm. He remains on amiodarone 400 mg daily/ Continued on diltiazem 120 mg daily, along with warfarin per pharmacy.CHADS VASC Score 6. He apparently was seen at the [place] in [place]  and was also told that he had an irregular heart rhythm. Echocardiogram was also completed at that time, but we do not have records. TSH elevated.  4. Hypertension: Blood pressure much better controlled since admission. He has not been restarted on amlodipine, but has been given hydralazine.    Recommendations:  I would get a PA and lateral chest x-ray.  He should continue anticoagulation and may need to have a TEE to further evaluate his valves.  He was supposed to see Dr. Radford Pax in the office in  December for consultation.  I would go ahead and initiate a low-dose of a diuretic and follow blood pressure.  Signed:W. Doristine Church. MD [facility]  02/07/2014, 2:15 PM

## 2014-02-08 DIAGNOSIS — I480 Paroxysmal atrial fibrillation: Secondary | ICD-10-CM

## 2014-02-08 LAB — PROTIME-INR
INR: 2.33 — ABNORMAL HIGH (ref 0.00–1.49)
Prothrombin Time: 25.7 seconds — ABNORMAL HIGH (ref 11.6–15.2)

## 2014-02-08 LAB — BASIC METABOLIC PANEL
ANION GAP: 14 (ref 5–15)
BUN: 33 mg/dL — ABNORMAL HIGH (ref 6–23)
CALCIUM: 9.6 mg/dL (ref 8.4–10.5)
CO2: 24 mEq/L (ref 19–32)
Chloride: 99 mEq/L (ref 96–112)
Creatinine, Ser: 1.65 mg/dL — ABNORMAL HIGH (ref 0.50–1.35)
GFR calc Af Amer: 44 mL/min — ABNORMAL LOW (ref >90)
GFR, EST NON AFRICAN AMERICAN: 38 mL/min — AB (ref >90)
Glucose, Bld: 111 mg/dL — ABNORMAL HIGH (ref 70–99)
Potassium: 4.3 mEq/L (ref 3.7–5.3)
SODIUM: 137 meq/L (ref 137–147)

## 2014-02-08 MED ORDER — DILTIAZEM HCL ER COATED BEADS 180 MG PO CP24
180.0000 mg | ORAL_CAPSULE | Freq: Every day | ORAL | Status: DC
  Administered 2014-02-08 – 2014-02-09 (×2): 180 mg via ORAL
  Filled 2014-02-08 (×2): qty 1

## 2014-02-08 MED ORDER — WARFARIN SODIUM 2.5 MG PO TABS
2.5000 mg | ORAL_TABLET | Freq: Once | ORAL | Status: AC
  Administered 2014-02-08: 2.5 mg via ORAL
  Filled 2014-02-08: qty 1

## 2014-02-08 MED ORDER — HYDRALAZINE HCL 10 MG PO TABS
10.0000 mg | ORAL_TABLET | Freq: Three times a day (TID) | ORAL | Status: DC
  Administered 2014-02-08 – 2014-02-09 (×2): 10 mg via ORAL
  Filled 2014-02-08 (×5): qty 1

## 2014-02-08 MED ORDER — AMIODARONE HCL 200 MG PO TABS
200.0000 mg | ORAL_TABLET | Freq: Every day | ORAL | Status: DC
  Administered 2014-02-09: 200 mg via ORAL
  Filled 2014-02-08: qty 1

## 2014-02-08 NOTE — Progress Notes (Signed)
Patient: [name] / Admit Date: 02/06/2014 / Date of Encounter: 02/08/2014, 10:13 AM   Subjective: Still feels generally weak but is doing better than the day he was admitted. Denies CP or dyspnea.   Objective: Telemetry: NSR, brief period of irregular rhythm yesterday afternoon - at times there were no definite P waves - suspected intermittent PAF during this time Physical Exam: Blood pressure 127/65, pulse 70, temperature 97.6 F (36.4 C), temperature source Oral, resp. rate 17, height 5\' 9"  (1.753 m), weight 201 lb 8 oz (91.4 kg), SpO2 97 %. General: Well developed, well nourished WM in no acute distress. Head: Normocephalic, atraumatic, sclera non-icteric, no xanthomas, nares are without discharge. Neck: Negative for carotid bruits. JVP not elevated. Lungs: Clear bilaterally to auscultation without wheezes, rales, or rhonchi. Breathing is unlabored. Heart: RRR S1 S2. Soft SEM LSB. No rubs, or gallops.  Abdomen: Soft, non-tender, non-distended with normoactive bowel sounds. No rebound/guarding. Extremities: No clubbing or cyanosis. No edema. Distal pedal pulses are 2+ and equal bilaterally. Neuro: Alert and oriented X 3. Moves all extremities spontaneously. Psych:  Responds to questions appropriately with a normal affect.   Intake/Output Summary (Last 24 hours) at 02/08/14 1013 Last data filed at 02/08/14 0841  Gross per 24 hour  Intake    740 ml  Output   2225 ml  Net  -1485 ml    Inpatient Medications:  . amiodarone  400 mg Oral Daily  . aspirin EC  81 mg Oral Daily  . diltiazem  120 mg Oral Daily  . docusate sodium  100 mg Oral BID  . furosemide  40 mg Oral Daily  . hydrALAZINE  10 mg Oral 4 times per day  . pantoprazole  40 mg Oral Daily  . polyethylene glycol  17 g Oral Daily  . pravastatin  40 mg Oral Daily  . Warfarin - Pharmacist Dosing Inpatient   Does not apply q1800   Infusions:    Labs:  Recent Labs  02/06/14 1800 02/07/14 0606  NA 139 139  K  4.4 4.3  CL 101 103  CO2 23 22  GLUCOSE 116* 99  BUN 32* 31*  CREATININE 1.80* 1.60*  CALCIUM 9.3 8.9    Recent Labs  02/07/14 0606  AST 26  ALT 35  ALKPHOS 64  BILITOT 0.3  PROT 7.1  ALBUMIN 3.9    Recent Labs  02/06/14 1800 02/07/14 0606  WBC 6.7 5.7  NEUTROABS 5.0 3.7  HGB 16.0 15.6  HCT 46.7 45.7  MCV 90.5 90.1  PLT 121* 130*    Recent Labs  02/06/14 1800 02/07/14 0045 02/07/14 0606  TROPONINI <0.30 <0.30 <0.30   Invalid input(s): POCBNP No results for input(s): HGBA1C in the last 72 hours.   Radiology/Studies:  Dg Chest 2 View  02/07/2014   CLINICAL DATA:  Weakness, CHF.  EXAM: CHEST  2 VIEW  COMPARISON:  02/07/2014 CT  FINDINGS: Heart size upper normal with prominent epicardial fat. Status post median sternotomy and CABG. Mild lung base opacities, favor atelectasis. No pleural effusion or pneumothorax. Multilevel degenerative change. No acute osseous finding.  IMPRESSION: Mild lung base opacities, favor atelectasis.  No overt edema.   Electronically Signed   By: Carlos Levering M.D.   On: 02/07/2014 19:07   Ct Chest High Resolution  02/08/2014   CLINICAL DATA:  Abnormal chest radiograph. Amiodarone therapy. Evaluate for interstitial lung disease.  EXAM: CT CHEST WITHOUT CONTRAST  TECHNIQUE: Multidetector CT imaging of the chest was performed  following the standard protocol without intravenous contrast. High resolution imaging of the lungs, as well as inspiratory and expiratory imaging, was performed.  COMPARISON:  None.  FINDINGS: Mediastinal lymph nodes are not enlarged by CT size criteria. Hilar regions are difficult to definitively evaluate without IV contrast. No axillary adenopathy. Atherosclerotic calcification of the arterial vasculature. Heart size within normal limits. No pericardial effusion.  No definitive subpleural reticulation, traction bronchiectasis/ bronchiolectasis, ground-glass, architectural distortion or honeycombing. Inspiratory and  expiratory imaging appears similar, making evaluation for air trapping difficult. Probable mild dependent atelectasis bilaterally. Lungs are otherwise clear. No pleural fluid. Airway is unremarkable.  Incidental imaging of the upper abdomen shows the liver to be slightly increased in attenuation diffusely. Visualized portions of the gallbladder, adrenal glands, kidneys, spleen, pancreas, stomach and bowel are otherwise grossly unremarkable. No upper abdominal adenopathy.  No worrisome lytic or sclerotic lesions. Degenerative changes are seen in the spine.  IMPRESSION: 1. Probable dependent atelectasis bilaterally. Difficult to definitively exclude mild/early interstitial lung disease. Prone imaging would be confirmatory. 2. Increased attenuation of the liver is in keeping with the given history of amiodarone therapy.   Electronically Signed   By: Lorin Picket M.D.   On: 02/08/2014 08:41   Dg Chest Port 1 View  02/06/2014   CLINICAL DATA:  Positive weakness.  Short of breath.  EXAM: PORTABLE CHEST - 1 VIEW  COMPARISON:  Radiograph 9 6,012  FINDINGS: Sternotomy wires overlie the large cardiac silhouette. There is central venous pulmonary congestion and mild pulmonary edema. Bilateral pleural effusions. There is left basilar opacity.  IMPRESSION: Cardiomegaly and mild pulmonary edema suggest congestive heart failure.  Left basilar opacity representing atelectasis or infiltrate.   Electronically Signed   By: Suzy Bouchard M.D.   On: 02/06/2014 17:48     Assessment and Plan  1. Generalized weakness, unclear etiology 2. Pulmonary infiltrates 3. CAD s/p 1V CABG (unknown vessel) ~ 2004 4. PAF, on Coumadin, (dx 12/2013 at [facility]) with history of recurrent TIAs 5. AKI on probable CKD stage III 6. Mitral valve annuloplasty in 2004 - mild MR by echo this admission 7. Mild AS by echo this admission 8. HTN with elevated BP on admission 9. Hyperlipidemia 10. Abnormal TSH but normal free T4  Etiology of  weakness is not totally clear to me. His TSH is quite high but free T4 is normal. ? Subclinical hypothyroidism in setting of amio. Despite maintaining NSR this AM he continues to feel a little weak. He describes an episode of a family member asking him to squeeze their hand before EMS arrived on day of admission but he was unable to. This may warrant further neuro imaging.  From a cardiac standpoint, pBNP was normal, but 2D echo showed high ventricular filling pressure. Lasix started with weight 205->201, -2L so far. CT: Difficult to definitively exclude mild/early interstitial lung disease, thus amidoarone toxicity is still possibly in question. He remains on amiodarone 400mg  daily (PTA dose). Will discuss further management with MD. Will also write for daily BMET given renal insufficiency. Might consider consolidating hydralazine to a more compliant-friendly dose (currently QID), vs increase dilitiazem to 180mg  daily. Dr. Wynonia Lawman suggests TEE to further evaluate but I am not really sure why and will clarify with Dr. Acie Fredrickson. He also now appears euvolemia so would consider backing off on Lasix dose given his kidney disease.  Signed, Melina Copa PA-C    Attending Note:   The patient was seen and examined.  Agree with assessment and plan  as noted above.  Changes made to the above note as needed.  Will decrease Amiodarone to 200 mg a day.  I dont think he needs this high a dose.   His symptoms of episodic weakness are not c/w pulmonary toxicity due to [facility].   I doubt that his symptoms are due to a cardiac etiology.  I have changed his hydralazine to TID and increased his Diltiazem to 180 DC a day.  Continue to diurese.  I agree with a neuro eval.   Ramond Dial., MD, [facility] 02/08/2014, 1:35 PM [address]  [place] Pager [phone]

## 2014-02-08 NOTE — Progress Notes (Signed)
ANTICOAGULATION CONSULT NOTE - Follow Up Consult  Pharmacy Consult for warfarin Indication: atrial fibrillation  No Known Allergies  Patient Measurements: Height: 5\' 9"  (175.3 cm) Weight: 201 lb 8 oz (91.4 kg) IBW/kg (Calculated) : 70.7  Vital Signs: Temp: 98.1 F (36.7 C) (11/23 1449) Temp Source: Oral (11/23 1449) BP: 119/64 mmHg (11/23 1449) Pulse Rate: 75 (11/23 1449)  Labs:  Recent Labs  02/06/14 1800 02/07/14 0045 02/07/14 0606 02/08/14 0906 02/08/14 1217  HGB 16.0  --  15.6  --   --   HCT 46.7  --  45.7  --   --   PLT 121*  --  130*  --   --   LABPROT 26.6*  --  27.0* 25.7*  --   INR 2.43*  --  2.48* 2.33*  --   CREATININE 1.80*  --  1.60*  --  1.65*  TROPONINI <0.30 <0.30 <0.30  --   --     Estimated Creatinine Clearance: 40.6 mL/min (by C-G formula based on Cr of 1.65).   Assessment: 80 y/o male admitted with weakness. He takes chronic warfarin for Afib. INR is therapeutic at 2.33. No bleeding noted, H/H stable, platelets are low stable.  PTA: 2.5 mg daily except 5 mg Wed  Goal of Therapy:  INR 2-3 Monitor platelets by anticoagulation protocol: Yes   Plan:  - Warfarin 2.5 mg PO tonight - INR daily  [facility], Pharm.D., BCPS Clinical Pharmacist Pager: [phone] 02/08/2014 3:14 PM

## 2014-02-08 NOTE — Progress Notes (Addendum)
[facility] Progress Note   [name] [id number] DOB: [birth year] DOA: 02/06/2014 PCP: [name], MD  Brief narrative: [name] is a 80 y.o. male past medical history of chronic A. fib, dyslipidemia, hypertension, mitral valve repair 10 years ago and diastolic heart failure who presents with generalized weakness and tiredness and is found to have bilateral infiltrates on chest x-ray. He states that he had an appointment with a cardiologist at the [facility] on 01/20/2014 and was placed on amiodarone. He was told that his mitral valve was leaking and according to his wife there was some discussion about performing a TEE. A follow-up visit was planned for January however they feel that they need an earlier visit with a cardiologist and have one scheduled with Dr. Radford Pax for 12/15.   Subjective: No complaints today other than feeling weak. He states that his spell of weakness tend to last for 2- 3 days when he has had them in the past.   Assessment/Plan: Principal Problem:   Generalized weakness - resolved very quickly without treatment- he stated initially that he felt much better even while he was in the ER  -TSH significantly elevated but normal T4- subclincal hypothyroid would not result in episodic weakness- I am concerned that the amiodarone may be affecting his TFTs - cardiology has tapered it down to 200mg   - checked pulse ox with ambulation to ensure he is not becoming hypoxic on exertion and resulting in his c/o weakness- pulse ox was normal.  - at this point I have no etiology for his episodes of generalized weakness and do no plan on further testing in house.   Active Problems:   Pulmonary infiltrates on CXR - no symptoms of pneumonia- d/c antibiotics - ProBNP not suggestive of CHF-suspected that infiltrates may be related to Amio toxicity but high resolution CT chest does not confirm this- no clinical symptoms to support a diagnosis of ILD either - specifically  no cough, hypoxia or dyspnea on exertion    H/O mitral valve repair--  Chronic diastolic heart failure - obtained ECHO to assess degree of regurgitation- EF 50-55%, mild Mitral regurg and mild AS, elevated LV filling pressures with mild LAE. - cardiology is diuresing with Lasix and would like to monitor him overnight     Chronic atrial fibrillation - on Amio and Cardizem daily- Amio decreased to 200 mg daily today - cont Warfarin    Essential hypertension - stable - cardiology managing medications     Dyslipidemia - cont Statin  Chronic mild thrombocytopenia - stable   Code Status: Full code Family Communication: Family at bedside Disposition Plan: Home DVT prophylaxis: Warfarin  Consultants: Cardiology   Procedures: none  Antibiotics: Anti-infectives    Start     Dose/Rate Route Frequency Ordered Stop   02/07/14 2000  azithromycin (ZITHROMAX) 500 mg in dextrose 5 % 250 mL IVPB  Status:  Discontinued     500 mg250 mL/hr over 60 Minutes Intravenous Every 24 hours 02/06/14 2215 02/07/14 0848   02/07/14 1900  cefTRIAXone (ROCEPHIN) 1 g in dextrose 5 % 50 mL IVPB - Premix  Status:  Discontinued     1 g100 mL/hr over 30 Minutes Intravenous Every 24 hours 02/06/14 2215 02/07/14 0848   02/06/14 1915  cefTRIAXone (ROCEPHIN) 1 g in dextrose 5 % 50 mL IVPB     1 g100 mL/hr over 30 Minutes Intravenous  Once 02/06/14 1906 02/06/14 2022   02/06/14 1915  azithromycin (ZITHROMAX) 500 mg in dextrose 5 %  250 mL IVPB     500 mg250 mL/hr over 60 Minutes Intravenous  Once 02/06/14 1906 02/06/14 2123         Objective: Filed Weights   02/06/14 2136 02/07/14 0456 02/08/14 0705  Weight: 93.169 kg (205 lb 6.4 oz) 92.6 kg (204 lb 2.3 oz) 91.4 kg (201 lb 8 oz)    Intake/Output Summary (Last 24 hours) at 02/08/14 1346 Last data filed at 02/08/14 1328  Gross per 24 hour  Intake    740 ml  Output   1850 ml  Net  -1110 ml     Vitals Filed Vitals:   02/07/14 1931 02/08/14 0132  02/08/14 0705 02/08/14 1104  BP: 126/64 137/49 127/65 129/83  Pulse: 68 66 70 73  Temp: 97.9 F (36.6 C) 97.8 F (36.6 C) 97.6 F (36.4 C)   TempSrc: Oral Oral Oral   Resp: 20 18 17    Height:      Weight:   91.4 kg (201 lb 8 oz)   SpO2: 98% 96% 97%     Exam: General: No acute respiratory distress Lungs: crackles in b/l bases L > R Cardiovascular: Regular rate and rhythm without murmur gallop or rub normal S1 and S2 Abdomen: Nontender, nondistended, soft, bowel sounds positive, no rebound, no ascites, no appreciable mass Extremities: No significant cyanosis, clubbing, or edema bilateral lower extremities  Data Reviewed: Basic Metabolic Panel:  Recent Labs Lab 02/06/14 1800 02/07/14 0606 02/08/14 1217  NA 139 139 137  K 4.4 4.3 4.3  CL 101 103 99  CO2 23 22 24   GLUCOSE 116* 99 111*  BUN 32* 31* 33*  CREATININE 1.80* 1.60* 1.65*  CALCIUM 9.3 8.9 9.6   Liver Function Tests:  Recent Labs Lab 02/07/14 0606  AST 26  ALT 35  ALKPHOS 64  BILITOT 0.3  PROT 7.1  ALBUMIN 3.9   No results for input(s): LIPASE, AMYLASE in the last 168 hours. No results for input(s): AMMONIA in the last 168 hours. CBC:  Recent Labs Lab 02/06/14 1800 02/07/14 0606  WBC 6.7 5.7  NEUTROABS 5.0 3.7  HGB 16.0 15.6  HCT 46.7 45.7  MCV 90.5 90.1  PLT 121* 130*   Cardiac Enzymes:  Recent Labs Lab 02/06/14 1800 02/07/14 0045 02/07/14 0606  TROPONINI <0.30 <0.30 <0.30   BNP (last 3 results)  Recent Labs  02/06/14 1800  PROBNP 198.9   CBG: No results for input(s): GLUCAP in the last 168 hours.  No results found for this or any previous visit (from the past 240 hour(s)).   Studies:  Recent x-ray studies have been reviewed in detail by the Attending Physician  Scheduled Meds:  Scheduled Meds: . [START ON 02/09/2014] amiodarone  200 mg Oral Daily  . aspirin EC  81 mg Oral Daily  . diltiazem  180 mg Oral Daily  . docusate sodium  100 mg Oral BID  . furosemide  40 mg  Oral Daily  . hydrALAZINE  10 mg Oral 3 times per day  . pantoprazole  40 mg Oral Daily  . polyethylene glycol  17 g Oral Daily  . pravastatin  40 mg Oral Daily  . Warfarin - Pharmacist Dosing Inpatient   Does not apply q1800   Continuous Infusions:   Time spent on care of this patient: 35 min   [place], MD 02/08/2014, 1:46 PM  LOS: 2 days   [facility] Office  [phone] Pager - Text Page per [email or web address]  If 7PM-7AM, please contact night-coverage [email or web address]

## 2014-02-09 LAB — BASIC METABOLIC PANEL
Anion gap: 15 (ref 5–15)
BUN: 37 mg/dL — AB (ref 6–23)
CALCIUM: 9.3 mg/dL (ref 8.4–10.5)
CO2: 25 mEq/L (ref 19–32)
CREATININE: 1.82 mg/dL — AB (ref 0.50–1.35)
Chloride: 100 mEq/L (ref 96–112)
GFR calc non Af Amer: 34 mL/min — ABNORMAL LOW (ref >90)
GFR, EST AFRICAN AMERICAN: 39 mL/min — AB (ref >90)
Glucose, Bld: 126 mg/dL — ABNORMAL HIGH (ref 70–99)
Potassium: 4.6 mEq/L (ref 3.7–5.3)
Sodium: 140 mEq/L (ref 137–147)

## 2014-02-09 LAB — PROTIME-INR
INR: 2.42 — ABNORMAL HIGH (ref 0.00–1.49)
PROTHROMBIN TIME: 26.5 s — AB (ref 11.6–15.2)

## 2014-02-09 MED ORDER — DILTIAZEM HCL ER COATED BEADS 180 MG PO CP24: 180.0000 mg | ORAL_CAPSULE | Freq: Every day | ORAL | Status: DC

## 2014-02-09 MED ORDER — HYDRALAZINE HCL 10 MG PO TABS: 10.0000 mg | ORAL_TABLET | Freq: Three times a day (TID) | ORAL | Status: DC

## 2014-02-09 MED ORDER — AMIODARONE HCL 200 MG PO TABS: 200.0000 mg | ORAL_TABLET | Freq: Every day | ORAL | Status: DC

## 2014-02-09 NOTE — Discharge Summary (Signed)
Physician Discharge Summary  [name] DOB: September 06, 1931 DOA: 02/06/2014  PCP: Micheal Frees, MD  Admit date: 02/06/2014 Discharge date: 02/09/2014  Time spent: >45 minutes  Recommendations for Outpatient Follow-up:  1. Repeat thyroid function tests in a few weeks and monitor for symptoms of hypothyroidism 2           Per cardiology notes, the patient will be discharged today without any diuretic however if dyspnea is to recur as a result of pulmonary edema, Lasix 20 mg daily should be initiated   Discharge Condition: Stable Diet recommendation: Heart healthy  Discharge Diagnoses:  Principal Problem:   Generalized weakness Active Problems:   Pulmonary infiltrates on CXR   H/O mitral valve repair   Chronic atrial fibrillation   Chronic diastolic heart failure   Essential hypertension   Dyslipidemia   Current use of long term anticoagulation   History of present illness:  [name] is a 80 y.o. male past medical history of chronic A. fib, dyslipidemia, hypertension, mitral valve repair 10 years ago and diastolic heart failure who presents with sudden onset generalized weakness and is found to have bilateral infiltrates on chest x-ray. He states that he had an appointment with a cardiologist at the [facility] on 01/20/2014 and was placed on amiodarone. He was told that his mitral valve was leaking and according to his wife there was some discussion about performing a TEE. A follow-up visit was planned for January however they feel that they need an earlier visit with a cardiologist and have one scheduled with Dr. Radford Phillips for 12/15.   Hospital Course:  Principal Problem:  Generalized weakness - resolved very quickly without treatment- he stated that he felt much better while he was in the ER and today states that he is feeling back to normal -TSH significantly elevated but normal T4- subclincal hypothyroid would not result in episodic weakness- I am concerned that  the amiodarone may be affecting his TFTs - cardiology has tapered it down to 200mg  -would recommend repeating TFTs in a few weeks and monitoring for symptoms of overt hypothyroidism -we checked pulse ox with ambulation to ensure he is not becoming hypoxic on exertion (resulting in his complaint of weakness) - pulse ox was normal.  - at this point I have no etiology for his episodes of generalized weakness which has essentially resolved-  do not plan on further testing in house.   Active Problems:  Pulmonary infiltrates on CXR - no symptoms of pneumonia- d/c'd antibiotics on the day after admission- - ProBNP not suggestive of CHF- I suspected that infiltrates may be related to Amio toxicity but high resolution CT chest does not confirm this- no clinical symptoms to support a diagnosis of ILD either - specifically no cough, hypoxia or dyspnea on exertion -Repeat chest x-ray on the day after admission reveals resolution of infiltrates - he reports below   H/O mitral valve repair-- Chronic diastolic heart failure - obtained ECHO to assess degree of regurgitation- EF 50-55%, mild Mitral regurg and mild AS, elevated LV filling pressures with mild LAE. - cardiology has been diuresing with Lasix -Lasix was discontinued this morning - he is in negative fluid balance by 2700 mL -Per cardiology notes, the patient will be discharged today without any diuretic however if dyspnea is to recur, Lasix 20 mg daily should be initiated  AKI on CKD 3 -Suspect increased BUN/creatinine is likely secondary to Lasix which was discontinued this morning   Chronic atrial fibrillation - on  Amio and Cardizem daily- Amio decreased to 200 mg daily and Cardizem increased to 180 mg daily by cardiology - cont Warfarin   Essential hypertension - stable - cardiology managing medications -above changes made and low-dose hydralazine added and Norvasc discontinued by cardiology   Dyslipidemia - cont Statin  Chronic mild  thrombocytopenia - stable   Procedures:  2-D echo  Consultations:  Cardiology  Discharge Exam: Filed Weights   02/07/14 0456 02/08/14 0705 02/09/14 0512  Weight: 92.6 kg (204 lb 2.3 oz) 91.4 kg (201 lb 8 oz) 91.6 kg (201 lb 15.1 oz)   Filed Vitals:   02/09/14 0512  BP: 116/64  Pulse: 66  Temp: 97.7 F (36.5 C)  Resp: 17    General: AAO x 3, no distress Cardiovascular: RRR, no murmurs  Respiratory: clear to auscultation bilaterally GI: soft, non-tender, non-distended, bowel sound positive Extremities: No cyanosis clubbing or edema noted  Discharge Instructions You were cared for by a hospitalist during your hospital stay. If you have any questions about your discharge medications or the care you received while you were in the hospital after you are discharged, you can call the unit and asked to speak with the hospitalist on call if the hospitalist that took care of you is not available. Once you are discharged, your primary care physician will handle any further medical issues. Please note that NO REFILLS for any discharge medications will be authorized once you are discharged, as it is imperative that you return to your primary care physician (or establish a relationship with a primary care physician if you do not have one) for your aftercare needs so that they can reassess your need for medications and monitor your lab values.      Discharge Instructions    Diet - low sodium heart healthy    Complete by:  As directed      Increase activity slowly    Complete by:  As directed             Medication List    STOP taking these medications        amLODipine 10 MG tablet  Commonly known as:  NORVASC     diltiazem 120 MG tablet  Commonly known as:  CARDIZEM      TAKE these medications        amiodarone 200 MG tablet  Commonly known as:  PACERONE  Take 1 tablet (200 mg total) by mouth daily.     aspirin EC 81 MG tablet  Take 81 mg by mouth daily.      diltiazem 180 MG 24 hr capsule  Commonly known as:  CARDIZEM CD  Take 1 capsule (180 mg total) by mouth daily.     docusate sodium 100 MG capsule  Commonly known as:  COLACE  Take 100 mg by mouth 2 (two) times daily.     hydrALAZINE 10 MG tablet  Commonly known as:  APRESOLINE  Take 1 tablet (10 mg total) by mouth 3 (three) times daily.     omeprazole 20 MG capsule  Commonly known as:  PRILOSEC  Take 20 mg by mouth daily.     polyethylene glycol packet  Commonly known as:  MIRALAX / GLYCOLAX  Take 17 g by mouth daily.     pravastatin 40 MG tablet  Commonly known as:  PRAVACHOL  Take 40 mg by mouth daily.     PROSTATE SUPPORT PO  Take 1 tablet by mouth as directed.  warfarin 5 MG tablet  Commonly known as:  COUMADIN  Take 2.5-5 mg by mouth daily. 2.5mg  daily except on Wed take 5mg        No Known Allergies    The results of significant diagnostics from this hospitalization (including imaging, microbiology, ancillary and laboratory) are listed below for reference.    Significant Diagnostic Studies: Dg Chest 2 View  02/07/2014   CLINICAL DATA:  Weakness, CHF.  EXAM: CHEST  2 VIEW  COMPARISON:  02/07/2014 CT  FINDINGS: Heart size upper normal with prominent epicardial fat. Status post median sternotomy and CABG. Mild lung base opacities, favor atelectasis. No pleural effusion or pneumothorax. Multilevel degenerative change. No acute osseous finding.  IMPRESSION: Mild lung base opacities, favor atelectasis.  No overt edema.   Electronically Signed   By: Carlos Levering M.D.   On: 02/07/2014 19:07   Ct Chest High Resolution  02/08/2014   CLINICAL DATA:  Abnormal chest radiograph. Amiodarone therapy. Evaluate for interstitial lung disease.  EXAM: CT CHEST WITHOUT CONTRAST  TECHNIQUE: Multidetector CT imaging of the chest was performed following the standard protocol without intravenous contrast. High resolution imaging of the lungs, as well as inspiratory and expiratory  imaging, was performed.  COMPARISON:  None.  FINDINGS: Mediastinal lymph nodes are not enlarged by CT size criteria. Hilar regions are difficult to definitively evaluate without IV contrast. No axillary adenopathy. Atherosclerotic calcification of the arterial vasculature. Heart size within normal limits. No pericardial effusion.  No definitive subpleural reticulation, traction bronchiectasis/ bronchiolectasis, ground-glass, architectural distortion or honeycombing. Inspiratory and expiratory imaging appears similar, making evaluation for air trapping difficult. Probable mild dependent atelectasis bilaterally. Lungs are otherwise clear. No pleural fluid. Airway is unremarkable.  Incidental imaging of the upper abdomen shows the liver to be slightly increased in attenuation diffusely. Visualized portions of the gallbladder, adrenal glands, kidneys, spleen, pancreas, stomach and bowel are otherwise grossly unremarkable. No upper abdominal adenopathy.  No worrisome lytic or sclerotic lesions. Degenerative changes are seen in the spine.  IMPRESSION: 1. Probable dependent atelectasis bilaterally. Difficult to definitively exclude mild/early interstitial lung disease. Prone imaging would be confirmatory. 2. Increased attenuation of the liver is in keeping with the given history of amiodarone therapy.   Electronically Signed   By: Lorin Picket M.D.   On: 02/08/2014 08:41   Dg Chest Port 1 View  02/06/2014   CLINICAL DATA:  Positive weakness.  Short of breath.  EXAM: PORTABLE CHEST - 1 VIEW  COMPARISON:  Radiograph 9 6,012  FINDINGS: Sternotomy wires overlie the large cardiac silhouette. There is central venous pulmonary congestion and mild pulmonary edema. Bilateral pleural effusions. There is left basilar opacity.  IMPRESSION: Cardiomegaly and mild pulmonary edema suggest congestive heart failure.  Left basilar opacity representing atelectasis or infiltrate.   Electronically Signed   By: Suzy Bouchard M.D.   On:  02/06/2014 17:48    Microbiology: No results found for this or any previous visit (from the past 240 hour(s)).   Labs: Basic Metabolic Panel:  Recent Labs Lab 02/06/14 1800 02/07/14 0606 02/08/14 1217 02/09/14 0458  NA 139 139 137 140  K 4.4 4.3 4.3 4.6  CL 101 103 99 100  CO2 23 22 24 25   GLUCOSE 116* 99 111* 126*  BUN 32* 31* 33* 37*  CREATININE 1.80* 1.60* 1.65* 1.82*  CALCIUM 9.3 8.9 9.6 9.3   Liver Function Tests:  Recent Labs Lab 02/07/14 0606  AST 26  ALT 35  ALKPHOS 64  BILITOT 0.3  PROT 7.1  ALBUMIN 3.9   No results for input(s): LIPASE, AMYLASE in the last 168 hours. No results for input(s): AMMONIA in the last 168 hours. CBC:  Recent Labs Lab 02/06/14 1800 02/07/14 0606  WBC 6.7 5.7  NEUTROABS 5.0 3.7  HGB 16.0 15.6  HCT 46.7 45.7  MCV 90.5 90.1  PLT 121* 130*   Cardiac Enzymes:  Recent Labs Lab 02/06/14 1800 02/07/14 0045 02/07/14 0606  TROPONINI <0.30 <0.30 <0.30   BNP: BNP (last 3 results)  Recent Labs  02/06/14 1800  PROBNP 198.9   CBG: No results for input(s): GLUCAP in the last 168 hours.     SignedDebbe Odea, MD [facility] 02/09/2014, 10:29 AM

## 2014-02-09 NOTE — Progress Notes (Signed)
Patient given discharge instructions and all questions answered.  Prescriptions faxed to [facility] per patient request.  Patient discharged via wheelchair with all belongings.

## 2014-02-09 NOTE — Progress Notes (Signed)
Patient Name: [name] Date of Encounter: 02/09/2014  Primary Cardiologist: Fransico Him MD    Principal Problem:   Generalized weakness Active Problems:   H/O mitral valve repair   Chronic atrial fibrillation   Chronic diastolic heart failure   Essential hypertension   Dyslipidemia   Current use of long term anticoagulation   Pulmonary infiltrates on CXR    SUBJECTIVE  Feeling good this morning. Denies any CP or SOB.  CURRENT MEDS . amiodarone  200 mg Oral Daily  . aspirin EC  81 mg Oral Daily  . diltiazem  180 mg Oral Daily  . docusate sodium  100 mg Oral BID  . hydrALAZINE  10 mg Oral 3 times per day  . pantoprazole  40 mg Oral Daily  . polyethylene glycol  17 g Oral Daily  . pravastatin  40 mg Oral Daily  . Warfarin - Pharmacist Dosing Inpatient   Does not apply q1800    OBJECTIVE  Filed Vitals:   02/08/14 1104 02/08/14 1449 02/08/14 2100 02/09/14 0512  BP: 129/83 119/64 128/56 116/64  Pulse: 73 75 69 66  Temp:  98.1 F (36.7 C) 97.5 F (36.4 C) 97.7 F (36.5 C)  TempSrc:  Oral Oral Oral  Resp:  18 20 17   Height:      Weight:    201 lb 15.1 oz (91.6 kg)  SpO2:  97% 97% 98%    Intake/Output Summary (Last 24 hours) at 02/09/14 0742 Last data filed at 02/09/14 0516  Gross per 24 hour  Intake   1060 ml  Output   1175 ml  Net   -115 ml   Filed Weights   02/07/14 0456 02/08/14 0705 02/09/14 0512  Weight: 204 lb 2.3 oz (92.6 kg) 201 lb 8 oz (91.4 kg) 201 lb 15.1 oz (91.6 kg)    PHYSICAL EXAM  General: Pleasant, NAD. Neuro: Alert and oriented X 3. Moves all extremities spontaneously. Psych: Normal affect. HEENT:  Normal  Neck: Supple without bruits or JVD. Lungs:  Resp regular and unlabored, CTA. Heart: RRR no s3, s4, or murmurs. Abdomen: Soft, non-tender, non-distended, BS + x 4.  Extremities: No clubbing, cyanosis or edema. DP/PT/Radials 2+ and equal bilaterally.  Accessory Clinical Findings  CBC  Recent Labs  02/06/14 1800  02/07/14 0606  WBC 6.7 5.7  NEUTROABS 5.0 3.7  HGB 16.0 15.6  HCT 46.7 45.7  MCV 90.5 90.1  PLT 121* [id number]*   Basic Metabolic Panel  Recent Labs  02/08/14 1217 02/09/14 0458  NA 137 140  K 4.3 4.6  CL 99 100  CO2 24 25  GLUCOSE 111* 126*  BUN 33* 37*  CREATININE 1.65* 1.82*  CALCIUM 9.6 9.3   Liver Function Tests  Recent Labs  02/07/14 0606  AST 26  ALT 35  ALKPHOS 64  BILITOT 0.3  PROT 7.1  ALBUMIN 3.9   Cardiac Enzymes  Recent Labs  02/06/14 1800 02/07/14 0045 02/07/14 0606  TROPONINI <0.30 <0.30 <0.30   Thyroid Function Tests  Recent Labs  02/07/14 0045  TSH 25.310*    TELE NSR with HR 60-70s, no significant ventricular ectopy    ECG  No new EKG  Echocardiogram 02/07/2014  LV EF: 50% -  55%  ------------------------------------------------------------------- Indications:   Ventricular tachycardia 427.1&quot;,.  ------------------------------------------------------------------- History:  PMH:  Atrial fibrillation. Congestive heart failure. Transient ischemic attack. Risk factors: Hypertension. Dyslipidemia.  ------------------------------------------------------------------- Study Conclusions  - Left ventricle: The cavity size was normal. Wall thickness was normal.  Systolic function was normal. The estimated ejection fraction was in the range of 50% to 55%. Wall motion was normal; there were no regional wall motion abnormalities. Doppler parameters are consistent with high ventricular filling pressure. - Aortic valve: Valve mobility was restricted. There was mild stenosis. - Mitral valve: Prior procedures included surgical repair. There was mild regurgitation. Valve area by continuity equation (using LVOT flow): 2.45 cm^2. - Left atrium: The atrium was mildly dilated. - Pulmonary arteries: Systolic pressure was mildly increased. PA peak pressure: 32 mm Hg (S).  Impressions:  - Low normal LV function;  EF 50-55; elevated left ventricular filling pressures; mild LAE; mild AS (mean gradient 14 mmHg); s/p MV repair with mild MR; mildly elevated pulmonary pressures.    Radiology/Studies  Dg Chest 2 View  02/07/2014   CLINICAL DATA:  Weakness, CHF.  EXAM: CHEST  2 VIEW  COMPARISON:  02/07/2014 CT  FINDINGS: Heart size upper normal with prominent epicardial fat. Status post median sternotomy and CABG. Mild lung base opacities, favor atelectasis. No pleural effusion or pneumothorax. Multilevel degenerative change. No acute osseous finding.  IMPRESSION: Mild lung base opacities, favor atelectasis.  No overt edema.   Electronically Signed   By: Carlos Levering M.D.   On: 02/07/2014 19:07   Ct Chest High Resolution  02/08/2014   CLINICAL DATA:  Abnormal chest radiograph. Amiodarone therapy. Evaluate for interstitial lung disease.  EXAM: CT CHEST WITHOUT CONTRAST  TECHNIQUE: Multidetector CT imaging of the chest was performed following the standard protocol without intravenous contrast. High resolution imaging of the lungs, as well as inspiratory and expiratory imaging, was performed.  COMPARISON:  None.  FINDINGS: Mediastinal lymph nodes are not enlarged by CT size criteria. Hilar regions are difficult to definitively evaluate without IV contrast. No axillary adenopathy. Atherosclerotic calcification of the arterial vasculature. Heart size within normal limits. No pericardial effusion.  No definitive subpleural reticulation, traction bronchiectasis/ bronchiolectasis, ground-glass, architectural distortion or honeycombing. Inspiratory and expiratory imaging appears similar, making evaluation for air trapping difficult. Probable mild dependent atelectasis bilaterally. Lungs are otherwise clear. No pleural fluid. Airway is unremarkable.  Incidental imaging of the upper abdomen shows the liver to be slightly increased in attenuation diffusely. Visualized portions of the gallbladder, adrenal glands, kidneys,  spleen, pancreas, stomach and bowel are otherwise grossly unremarkable. No upper abdominal adenopathy.  No worrisome lytic or sclerotic lesions. Degenerative changes are seen in the spine.  IMPRESSION: 1. Probable dependent atelectasis bilaterally. Difficult to definitively exclude mild/early interstitial lung disease. Prone imaging would be confirmatory. 2. Increased attenuation of the liver is in keeping with the given history of amiodarone therapy.   Electronically Signed   By: Lorin Picket M.D.   On: 02/08/2014 08:41   Dg Chest Port 1 View  02/06/2014   CLINICAL DATA:  Positive weakness.  Short of breath.  EXAM: PORTABLE CHEST - 1 VIEW  COMPARISON:  Radiograph 9 6,012  FINDINGS: Sternotomy wires overlie the large cardiac silhouette. There is central venous pulmonary congestion and mild pulmonary edema. Bilateral pleural effusions. There is left basilar opacity.  IMPRESSION: Cardiomegaly and mild pulmonary edema suggest congestive heart failure.  Left basilar opacity representing atelectasis or infiltrate.   Electronically Signed   By: Suzy Bouchard M.D.   On: 02/06/2014 17:48    ASSESSMENT AND PLAN  1. Generalized weakness, unclear etiology  - appears intermittent, low suspicion for amiodarone toxicity.   - markedly elevated TSH with normal free T4, ?subclinical hypothyroidism.   - likely discharge today,  off diuretic with Cr increase, if has recurrent SOB on followup, will need 20mg  maintenance lasix. Has previous scheduled followup with Dr. Radford Pax on Dec 15th  2. Pulmonary infiltrates: questionable at this time, CT neg for sign of amio toxicity. Repeat CXR negative for infiltrate 3. CAD s/p 1V CABG (unknown vessel) ~ 2004 4. PAF, on Coumadin, (dx 12/2013 at [facility]) with history of recurrent TIAs  - Diltiazem increased to 180mg  daily.   - amiodarone dose decreased 5. AKI on probable CKD stage III  - ?related to diuresis, Cr went up from 1.65 to 1.82 6. Mitral valve annuloplasty in 2004 -  mild MR by echo this admission 7. Mild AS by echo this admission 8. HTN with elevated BP on admission 9. Hyperlipidemia 10. Abnormal TSH but normal free T4  Ruben Im Pager: [phone]   Attending Note:   The patient was seen and examined.  Agree with assessment and plan as noted above.  Changes made to the above note as needed.  His symptoms seem to be noncardiac. Follow up with Dr. Radford Pax in several weeks.    Thayer Headings, Brooke Bonito., MD, [facility] 02/09/2014, 10:01 AM [address]  [place] Pager [phone]

## 2014-03-02 ENCOUNTER — Ambulatory Visit (INDEPENDENT_AMBULATORY_CARE_PROVIDER_SITE_OTHER): Payer: Medicare Other | Admitting: Cardiology

## 2014-03-02 ENCOUNTER — Encounter: Payer: Self-pay | Admitting: Cardiology

## 2014-03-02 VITALS — BP 148/58 | HR 72 | Ht 69.0 in | Wt 204.6 lb

## 2014-03-02 DIAGNOSIS — I10 Essential (primary) hypertension: Secondary | ICD-10-CM

## 2014-03-02 DIAGNOSIS — I2510 Atherosclerotic heart disease of native coronary artery without angina pectoris: Secondary | ICD-10-CM

## 2014-03-02 DIAGNOSIS — I5032 Chronic diastolic (congestive) heart failure: Secondary | ICD-10-CM

## 2014-03-02 DIAGNOSIS — I480 Paroxysmal atrial fibrillation: Secondary | ICD-10-CM

## 2014-03-02 DIAGNOSIS — N183 Chronic kidney disease, stage 3 unspecified: Secondary | ICD-10-CM

## 2014-03-02 DIAGNOSIS — E785 Hyperlipidemia, unspecified: Secondary | ICD-10-CM

## 2014-03-02 DIAGNOSIS — Z9889 Other specified postprocedural states: Secondary | ICD-10-CM

## 2014-03-02 DIAGNOSIS — Z7901 Long term (current) use of anticoagulants: Secondary | ICD-10-CM

## 2014-03-02 NOTE — Progress Notes (Signed)
[address], [place], [place]  [address] Phone: [phone] Fax:  [phone]  Date:  03/02/2014   ID:  [name], DOB Oct 23, 1931, MRN [id number]  PCP:  [name], MD  Cardiologist:  Fransico Him, MD    History of Present Illness: [name] is a 80 y.o. male with a history of ASCAD with single vessel CABF, MV annuloplasty, TIA, PAF on coumadin, HTN, and dyslipidemia who presents today for followup.  He was recently in [facility] with sudden onset of generalized weakness, fatigue and difficult taking deep breaths.  Chest xray revealed CM and mild pulmonary edema with CHF.  Pro BNP was 198.  He was started on IV antibiotics for Pulmonary infiltrates.  His chest CT was negative for Amio toxicity.  2D echo showed low normal LVF with EF 50-55% with mild AS and mild MR.  He has been on amio for suppression of PAF.  His CHADS2VASC score is 6 and he is on anticoagulation.  His creatinine bumped on diuretics and therefore was not sent home on any diuretics.  He is now here for followup.  Since discharge he says that he is back to normal.  He denies any SOB, DOE, chest pain, LE edema.  He denies any palpitations, dizziness or syncope.   Wt Readings from Last 3 Encounters:  03/02/14 204 lb 9.6 oz (92.806 kg)  02/09/14 201 lb 15.1 oz (91.6 kg)  06/27/11 201 lb (91.173 kg)     Past Medical History  Diagnosis Date  . Atrial fibrillation   . Diverticulosis 2002    Colonoscopy  . TIA (transient ischemic attack)   . Hyperlipidemia   . Hypertension   . Hemorrhoids   . Adenomatous colon polyp   . Chronic atrial fibrillation 02/07/2014  . H/O mitral valve repair 02/07/2014  . Chronic diastolic heart failure 0000000  . Essential hypertension 02/07/2014  . Dyslipidemia 02/07/2014  . Current use of long term anticoagulation 02/07/2014    Current Outpatient Prescriptions  Medication Sig Dispense Refill  . aspirin EC 81 MG tablet Take 81 mg by mouth daily.    .  Cholecalciferol (VITAMIN D-3 PO) Take 2,000 Units by mouth daily.    Marland Kitchen diltiazem (CARDIZEM CD) 180 MG 24 hr capsule Take 1 capsule (180 mg total) by mouth daily. 30 capsule 0  . diltiazem (TIAZAC) 120 MG 24 hr capsule Take 120 mg by mouth daily.    Marland Kitchen docusate sodium (COLACE) 100 MG capsule Take 100 mg by mouth 2 (two) times daily.    . hydrALAZINE (APRESOLINE) 10 MG tablet Take 1 tablet (10 mg total) by mouth 3 (three) times daily. 90 tablet 0  . NATURAL PSYLLIUM FIBER PO Take by mouth as directed.    Marland Kitchen omeprazole (PRILOSEC) 20 MG capsule Take 20 mg by mouth daily.    . pravastatin (PRAVACHOL) 10 MG tablet Take 10 mg by mouth daily.    Marland Kitchen warfarin (COUMADIN) 5 MG tablet Take 2.5-5 mg by mouth daily. 2.5mg  daily except on Wed take 5mg      No current facility-administered medications for this visit.    Allergies:   No Known Allergies  Social History:  The patient  reports that he has never smoked. He has never used smokeless tobacco. He reports that he does not drink alcohol or use illicit drugs.   Family History:  The patient's family history includes Heart disease in his father; Heart failure in his brother; Stroke in his mother.  ROS:  Please see the history of present illness.      All other systems reviewed and negative.   PHYSICAL EXAM: VS:  BP 148/58 mmHg  Pulse 72  Ht 5\' 9"  (1.753 m)  Wt 204 lb 9.6 oz (92.806 kg)  BMI 30.20 kg/m2  SpO2 98% Well nourished, well developed, in no acute distress HEENT: normal Neck: no JVD Cardiac:  normal S1, S2; RRR; 2/6 SM at RUSB to LLSB Lungs:  clear to auscultation bilaterally, no wheezing, rhonchi or rales Abd: soft, nontender, no hepatomegaly Ext: trace edema Skin: warm and dry Neuro:  CNs 2-12 intact, no focal abnormalities noted       ASSESSMENT AND PLAN:  1. Chronic diastolic CHF - appears euvolemic 2. MR s/p remote MV annuloplasty 3. ASCAD with 1 vessel CABG with no angina.  Continue ASA 4. HTN - borderline controlled.  His  BP at home runs 115/68-145/85mmHg.  Continue Cardizem/Hydralazine 5. Dyslipidemia - continue statin. I will get a copy of his recent lipids 6.   PAF maintaining NSR.  Continue Cardizem/warafin.  His Amio was stopped due to hypothyroidism   7.   CKD stage III - will get a copy of his BMET from Dr. Kenton Kingfisher  Followup with me in 6 months   Signed, Fransico Him, MD [facility] 03/02/2014 8:59 AM

## 2014-03-02 NOTE — Patient Instructions (Signed)
Your physician wants you to follow-up in: 6 months with Dr. Radford Pax. You will receive a reminder letter in the mail two months in advance. If you don't receive a letter, please call our office to schedule the follow-up appointment.

## 2014-03-17 ENCOUNTER — Telehealth: Payer: Self-pay | Admitting: Cardiology

## 2014-03-17 NOTE — Telephone Encounter (Signed)
Informed pt's wife that the scale she is referring to is from her insurance company, not Dr. Radford Pax. She is very thankful for the call back.

## 2014-03-17 NOTE — Telephone Encounter (Signed)
New message   Wife calling  The home phone having issues . Due to cut line .   Wife stating Patient weight will not be download - attempt to send data .

## 2014-04-13 ENCOUNTER — Telehealth: Payer: Self-pay | Admitting: *Deleted

## 2014-04-13 ENCOUNTER — Ambulatory Visit (INDEPENDENT_AMBULATORY_CARE_PROVIDER_SITE_OTHER): Payer: Medicare Other | Admitting: Gastroenterology

## 2014-04-13 ENCOUNTER — Encounter: Payer: Self-pay | Admitting: Gastroenterology

## 2014-04-13 VITALS — BP 140/62 | HR 64 | Ht 69.0 in | Wt 200.4 lb

## 2014-04-13 DIAGNOSIS — Z7901 Long term (current) use of anticoagulants: Secondary | ICD-10-CM

## 2014-04-13 DIAGNOSIS — K625 Hemorrhage of anus and rectum: Secondary | ICD-10-CM

## 2014-04-13 DIAGNOSIS — K649 Unspecified hemorrhoids: Secondary | ICD-10-CM

## 2014-04-13 NOTE — Telephone Encounter (Signed)
04/13/2014   RE: [name] DOB: Feb 09, 1932 MRN: [id number]  Dear Dr Radford Pax:   We have scheduled the above patient for a hemorrhoidal banding procedure. Our records show that he is on anticoagulation therapy.  Please advise as to whether the patient may come off his therapy of coumadin prior to the procedure, which is scheduled for 05/06/14. Please route your response to Quintin Alto, CMA.   Sincerely,  Dixon Boos

## 2014-04-13 NOTE — Progress Notes (Signed)
     04/13/2014 [name] [id number] [birth year]   History of Present Illness:  This is a pleasant 80 year old male who is known to Dr. Olevia Perches.  His last colonoscopy was in 06/2011 at which time he was found to have one polyp that was removed and was a tubular adenoma (with melanosis coli on pathology), diverticulosis, and melanosis coli throughout the colon.  He presents to our office today with complaints of rectal bleeding and hemorrhoids.  He saw Dr. Rosendo Gros with [facility] back in October 2015 at which time he had an internal hemorrhoid banding at the 6:00 position.  He comes in today stating that he continues to have issues with rectal bleeding when he has bowel movements.  This has now been going on intermittently for several months.  He takes colace and fiber supplements.  He is on coumadin.  He has hydrocortisone suppositories from the [place], but has only been using them once daily at most.  Says that he would just like to resolve the bleeding.      Current Medications, Allergies, Past Medical History, Past Surgical History, Family History and Social History were reviewed in [employer] record.   Physical Exam: BP 140/62 mmHg  Pulse 64  Ht 5\' 9"  (1.753 m)  Wt 200 lb 6 oz (90.89 kg)  BMI 29.58 kg/m2 General: Well developed white male in no acute distress Head: Normocephalic and atraumatic Eyes:  Sclerae anicteric, conjunctiva pink  Ears: Normal auditory acuity Rectal:  External hemorrhoid noted at the 6:00 position.  Inflamed/enlarged internal hemorrhoid (grade 2-3) noted at about the same area (6:00) position.    Musculoskeletal: Symmetrical with no gross deformities  Extremities: No edema  Neurological: Alert oriented x 4, grossly nonfocal Psychological:  Alert and cooperative. Normal mood and affect  Assessment and Recommendations: -Hemorrhoids:  Both internal and external, with bleeding suspected from the internal hemorrhoid.  Patient had one hemorrhoid  banding by Dr. Rosendo Gros, but desires to attempt further banding procedures.  Discussed O'Regan banding.  Patient will be scheduled with Dr. Carlean Purl for banding.  In the meantime, he will use the hydrocortisone suppositories that he has at home twice daily.   -Chronic coagulopathy:  Is on coumadin.  The risks benefits and alternatives to a temporary hold of anti-coagulants/anti-platelets for the procedure were discussed with the patient he consents to proceed. Obtain clearance from Dr. Radford Pax for ok to hold coumadin prior to procedure.

## 2014-04-13 NOTE — Telephone Encounter (Signed)
To Coumadin Clinic. 

## 2014-04-13 NOTE — Telephone Encounter (Signed)
Patient needs Lovenox bridge due to history of PAF with TIA - please send this to coumadin clinic

## 2014-04-13 NOTE — Patient Instructions (Signed)
You have been scheduled for a hemorrhoidal banding with Dr Silvano Rusk on May 06, 2014 @ 2:00 pm.

## 2014-04-14 NOTE — Progress Notes (Signed)
Reviewed. External hems should not be banded. If pt has int and ex hemorrhoids, he should see Dr Rosendo Gros for [facility] hemorrhoidectomy.

## 2014-04-15 NOTE — Telephone Encounter (Signed)
Spoke with pt and wife.  Pt has his anticoagulation managed by the [facility].  Spoke with Dr. Sharee Holster at the [facility].  Discussed pt's labs results and weight.  They will take care of lovenox bridging and will dose at 1mg /kg once daily given renal insufficiency, age, and he has been in NSR for past several OV.

## 2014-04-16 NOTE — Progress Notes (Signed)
We have since found out that he requires Lovenox bridging. So is much less favorable if not contraindicated for O'Regan hemorrhoid Tx.  We will need to explain to him that risks seem to outweigh benefits - he really needs to be off anticoagulation for about a week - since he has had a TIA from Afib cannot really do the banding.  Will need to cancel that and I can see him in the office vs. Dr. Olevia Perches his reg GI MD.

## 2014-04-16 NOTE — Progress Notes (Signed)
I have left a message for patient to call back.We will have him to contact and see Dr Lubertha South again to discuss hemorrhoidal banding vs hemorrhoidectomy due to the potential risks of going forward with [name] banding system knowing he would need lovenox bridging etc.

## 2014-04-16 NOTE — Progress Notes (Signed)
Spoke to patient's wife to explain Dr Celesta Aver and Dr Nichola Sizer recommendations. She asked that I contact Dr Rosendo Gros office to set them back up for another appointment. I actually spoke to Dr Rosendo Gros, himself and he states that he originally sent the patient to [place] to discuss colonoscopy as he did not want to continue to band for hemorrhoids if the bleeding was possible diverticular in nature. I explained that patient did have a colonoscopy in 2013 and gave him the findings of that procedure. Per Alonza Bogus, PA-C, patient's exam did show a large internal hemorrhoid. Dr Rosendo Gros would still be more comfortable making certain that this patient does not have diverticular bleed prior to going forward with additional hemorrhoidal procedures. I explained this to Janett Billow and called the patient's wife to explain this information to her as well. She states that she just found paperwork that patient actually had a colonoscopy in 07/2013 at the [facility] (showed diverticulosis and sessile polyp- path not available). I advised that it would be extremely important for both Dr Rosendo Gros and our office to have. She tells me that she will contact Dr Rosendo Gros and get him those records as this may be all he needs.

## 2014-04-21 ENCOUNTER — Telehealth: Payer: Self-pay | Admitting: Internal Medicine

## 2014-04-21 NOTE — Telephone Encounter (Signed)
Spoke with patient's wife and she states the patient is still having rectal bleeding with bright, red blood. She states he really needs to have something done. His hemorrhoid banding with Dr. Carlean Purl was cancelled because it is contraindicated with a patient on anticoagulant. It looks as if he has seen Dr. Justine Null in the past also. Should he see a surgeon or Dr. Olevia Perches? Please, advise.

## 2014-04-21 NOTE — Telephone Encounter (Signed)
Dr Georgina Snell do banding due to pt having to be bridged with Lovenox. Dr Rosendo Gros will band his hemorrhoids but he has to review his colonoscopy report from [facility] from last year. Please set up an appointment with Dr Rosendo Gros while we are waiting for colon report. Somebody request it it but I don't know who- too many people involved in this case.

## 2014-04-21 NOTE — Telephone Encounter (Signed)
Spoke with Delilah Shan at [facility] and scheduled 05/06/14 at 10:40 AM with Dr. Rosendo Gros. Needs to arrive 15 minutes early. Patient;'s wife notified of appointment.

## 2014-05-06 ENCOUNTER — Encounter: Payer: Medicare Other | Admitting: Internal Medicine

## 2014-05-14 ENCOUNTER — Ambulatory Visit (INDEPENDENT_AMBULATORY_CARE_PROVIDER_SITE_OTHER): Payer: Self-pay | Admitting: General Surgery

## 2014-05-14 NOTE — H&P (Unsigned)
History of Present Illness Micheal Ok MD; 05/14/2014 2:11 PM) The patient is a 81 year old male who presents with hemorrhoids. The patient is a 81 year old male who is referred by Dr. Olevia Perches for an evaluation of internal hemorrhoids. Patient has a history of A. fib but is on Coumadin and aspirin by the [facility]. He stopped this recently.  The patient states he's had some bright red blood including times it is not having a bowel movement. He is on docusate as well as psyllium fiber. The patient does state that he has some constipation at times. The patient states he's not always well-hydrated.  He is interested in surgery.   Review of Systems Micheal Ok, MD; 05/14/2014 2:11 00) General Present- Fatigue and Night Sweats. Not Present- Appetite Loss, Chills, Fever, Weight Gain and Weight Loss. Skin Not Present- Change in Wart/Mole, Dryness, Hives, Jaundice, New Lesions, Non-Healing Wounds, Rash and Ulcer. HEENT Present- Hearing Loss, Ringing in the Ears and Wears glasses/contact lenses. Not Present- Earache, Hoarseness, Nose Bleed, Oral Ulcers, Seasonal Allergies, Sinus Pain, Sore Throat, Visual Disturbances and Yellow Eyes. Respiratory Not Present- Bloody sputum, Chronic Cough, Difficulty Breathing, Snoring and Wheezing. Breast Not Present- Breast Mass, Breast Pain, Nipple Discharge and Skin Changes. Cardiovascular Not Present- Chest Pain, Difficulty Breathing Lying Down, Leg Cramps, Palpitations, Rapid Heart Rate, Shortness of Breath and Swelling of Extremities. Gastrointestinal Present- Bloody Stool and Hemorrhoids. Not Present- Abdominal Pain, Bloating, Change in Bowel Habits, Chronic diarrhea, Constipation, Difficulty Swallowing, Excessive gas, Gets full quickly at meals, Indigestion, Nausea, Rectal Pain and Vomiting. Male Genitourinary Not Present- Blood in Urine, Change in Urinary Stream, Frequency, Impotence, Nocturia, Painful Urination, Urgency and Urine Leakage.   Physical Exam  Micheal Ok MD; 05/14/2014 2:12 PM) General Mental Status-Alert. General Appearance-Consistent with stated age. Hydration-Well hydrated. Voice-Normal.  Head and Neck Head-normocephalic, atraumatic with no lesions or palpable masses. Trachea-midline. Thyroid Gland Characteristics - normal size and consistency.  Eye Eyeball - Bilateral-Extraocular movements intact. Sclera/Conjunctiva - Bilateral-No scleral icterus.  Chest and Lung Exam Chest and lung exam reveals -quiet, even and easy respiratory effort with no use of accessory muscles and on auscultation, normal breath sounds, no adventitious sounds and normal vocal resonance. Inspection Chest Wall - Normal. Back - normal.  Cardiovascular Cardiovascular examination reveals -normal heart sounds, regular rate and rhythm with no murmurs and normal pedal pulses bilaterally.  Abdomen Inspection Normal Exam - No Hernias. Skin - Scar - no surgical scars. Palpation/Percussion Normal exam - Soft, Non Tender, No Rebound tenderness, No Rigidity (guarding) and No hepatosplenomegaly. Auscultation Normal exam - Bowel sounds normal.  Rectal Anorectal Exam External - external hemorrhoids(at 6:00 position). Internal - internal hemorrhoids(Internal hemorrhoid at the 12:00 position in the lithotomy position, nonbleeding).  Neurologic Neurologic evaluation reveals -alert and oriented x 3 with no impairment of recent or remote memory. Mental Status-Normal.  Musculoskeletal Normal Exam - Left-Upper Extremity Strength Normal and Lower Extremity Strength Normal. Normal Exam - Right-Upper Extremity Strength Normal and Lower Extremity Strength Normal.    Assessment & Plan Micheal Ok MD; 05/14/2014 2:15 PM) INTERNAL HEMORRHOID (455.0  K64.8) Impression: 81 year old male with internal bleeding hemorrhoid, and external hemorrhoid.  Proceed with endoscopy and banding of internal hemorrhoid times one.  Patient procedure well.  Will attmept to obtain colonoscopy from [place]  I believe the patient will have a good outcome with his banding. The patient is interested in hemorrhoidectomy. I did discuss with the patient this will involve a lot of pain postoperatively. Also discussed with the  patient the risks and benefits of the procedure to include but not limited to: Infection, bleeding, damage to structures to include the internal sphincter, possible incontinence and possible recurrence. The patient voiced understanding and wished to proceed.

## 2014-05-26 ENCOUNTER — Encounter: Payer: Medicare Other | Admitting: Internal Medicine

## 2014-05-27 ENCOUNTER — Encounter: Payer: Self-pay | Admitting: Cardiology

## 2014-07-15 ENCOUNTER — Encounter (HOSPITAL_COMMUNITY)
Admission: RE | Admit: 2014-07-15 | Discharge: 2014-07-15 | Disposition: A | Discharge status: Home / Self Care | Payer: Medicare Other | Source: Ambulatory Visit | Attending: Cardiology | Admitting: Cardiology

## 2014-07-15 NOTE — Progress Notes (Signed)
Cardiac Rehab Medication Review by a Pharmacist  Does the patient  feel that his/her medications are working for him/her?  yes  Has the patient been experiencing any side effects to the medications prescribed?  yes  Does the patient measure his/her own blood pressure or blood glucose at home?  yes   Does the patient have any problems obtaining medications due to transportation or finances?   no  Understanding of regimen: good Understanding of indications: good Potential of compliance: good    Pharmacist comments: Patient and spouse demonstrate good understanding of medication regimen and good adherence to medications. Does not appear to have any barriers to therapy and medications are working well for him.   Ginny Forth  Student Pharmacist 07/15/2014 8:20 AM

## 2014-07-19 ENCOUNTER — Encounter (HOSPITAL_COMMUNITY)
Admission: RE | Admit: 2014-07-19 | Discharge: 2014-07-19 | Disposition: A | Discharge status: Home / Self Care | Payer: Medicare Other | Source: Ambulatory Visit | Attending: Cardiology | Admitting: Cardiology

## 2014-07-19 DIAGNOSIS — I509 Heart failure, unspecified: Secondary | ICD-10-CM | POA: Diagnosis present

## 2014-07-19 NOTE — Progress Notes (Signed)
Pt started cardiac rehab today.  Pt tolerated light exercise without difficulty. Telemetry rhythm Sinus. Vital signs stable. Mr [name] short term goals are to increase his stamina and not to get fluid around his heart. Mr [name] long term goals are to loose weight. PHQ=0. Psychosocial Assessment: No needs identified at this time. Will continue to monitor the patient throughout  the program.

## 2014-07-21 ENCOUNTER — Encounter (HOSPITAL_COMMUNITY)
Admission: RE | Admit: 2014-07-21 | Discharge: 2014-07-21 | Disposition: A | Discharge status: Home / Self Care | Payer: Medicare Other | Source: Ambulatory Visit | Attending: Cardiology | Admitting: Cardiology

## 2014-07-21 DIAGNOSIS — I509 Heart failure, unspecified: Secondary | ICD-10-CM | POA: Diagnosis not present

## 2014-07-23 ENCOUNTER — Encounter (HOSPITAL_COMMUNITY)
Admission: RE | Admit: 2014-07-23 | Discharge: 2014-07-23 | Disposition: A | Discharge status: Home / Self Care | Payer: Medicare Other | Source: Ambulatory Visit | Attending: Cardiology | Admitting: Cardiology

## 2014-07-23 DIAGNOSIS — I509 Heart failure, unspecified: Secondary | ICD-10-CM | POA: Diagnosis not present

## 2014-07-26 ENCOUNTER — Encounter (HOSPITAL_COMMUNITY)
Admission: RE | Admit: 2014-07-26 | Discharge: 2014-07-26 | Disposition: A | Discharge status: Home / Self Care | Payer: Medicare Other | Source: Ambulatory Visit | Attending: Cardiology | Admitting: Cardiology

## 2014-07-26 DIAGNOSIS — I509 Heart failure, unspecified: Secondary | ICD-10-CM | POA: Diagnosis not present

## 2014-07-26 NOTE — Progress Notes (Signed)
Academic intern reviewed home exercise with pt today.  Pt plans to walk and use treadmill and stationary bike at home for exercise.  Reviewed THR, pulse, RPE, sign and symptoms, and when to call 911 or MD.  Pt voiced understanding. Shirlyn Goltz, Academic Intern Alberteen Sam, MA, ACSM RCEP

## 2014-07-28 ENCOUNTER — Encounter (HOSPITAL_COMMUNITY)
Admission: RE | Admit: 2014-07-28 | Discharge: 2014-07-28 | Disposition: A | Discharge status: Home / Self Care | Payer: Medicare Other | Source: Ambulatory Visit | Attending: Cardiology | Admitting: Cardiology

## 2014-07-28 DIAGNOSIS — I509 Heart failure, unspecified: Secondary | ICD-10-CM | POA: Diagnosis not present

## 2014-07-30 ENCOUNTER — Encounter (HOSPITAL_COMMUNITY)
Admission: RE | Admit: 2014-07-30 | Discharge: 2014-07-30 | Disposition: A | Discharge status: Home / Self Care | Payer: Medicare Other | Source: Ambulatory Visit | Attending: Cardiology | Admitting: Cardiology

## 2014-07-30 DIAGNOSIS — I509 Heart failure, unspecified: Secondary | ICD-10-CM | POA: Diagnosis not present

## 2014-08-02 ENCOUNTER — Encounter (HOSPITAL_COMMUNITY)
Admission: RE | Admit: 2014-08-02 | Discharge: 2014-08-02 | Disposition: A | Discharge status: Home / Self Care | Payer: Medicare Other | Source: Ambulatory Visit | Attending: Cardiology | Admitting: Cardiology

## 2014-08-02 DIAGNOSIS — I509 Heart failure, unspecified: Secondary | ICD-10-CM | POA: Diagnosis not present

## 2014-08-02 NOTE — Progress Notes (Signed)
Patient appears to be in Atrial fib at a controlled rate in the 80's patient is asymptomatic and is on warfarin. Mr [name] is asymptomatic and is on warfarin. Mrs [name] said  The patient's last INR was 1.9. Patient asymptomatic. Blood pressure 124/64. Oxygen saturation 99% on room air. Luisa Dago PAC called and notified. Gaspar Bidding said Mr [name] is okay to exercise today. The patient is taking his medication as prescribed. Will notify Dr Radford Pax at the office about the patient's rhythm change.

## 2014-08-02 NOTE — Progress Notes (Signed)
Sharee Holster Pharm D at the [facility] called and notified about the patient's atrial fibrillation. Dr Oletta Lamas will call the patient about having his coumadin check moved up. Today's ECG tracings also faxed to the patient's cardiologist at the [facility] Dr Emeline Gins. Will continue to monitor the patient throughout  the program.

## 2014-08-04 ENCOUNTER — Encounter (HOSPITAL_COMMUNITY)
Admission: RE | Admit: 2014-08-04 | Discharge: 2014-08-04 | Disposition: A | Discharge status: Home / Self Care | Payer: Medicare Other | Source: Ambulatory Visit | Attending: Cardiology | Admitting: Cardiology

## 2014-08-04 DIAGNOSIS — I509 Heart failure, unspecified: Secondary | ICD-10-CM | POA: Diagnosis not present

## 2014-08-06 ENCOUNTER — Encounter (HOSPITAL_COMMUNITY)
Admission: RE | Admit: 2014-08-06 | Discharge: 2014-08-06 | Disposition: A | Discharge status: Home / Self Care | Payer: Medicare Other | Source: Ambulatory Visit | Attending: Cardiology | Admitting: Cardiology

## 2014-08-06 ENCOUNTER — Encounter: Payer: Self-pay | Admitting: Nurse Practitioner

## 2014-08-06 ENCOUNTER — Ambulatory Visit (INDEPENDENT_AMBULATORY_CARE_PROVIDER_SITE_OTHER): Payer: Medicare Other | Admitting: Nurse Practitioner

## 2014-08-06 VITALS — BP 120/50 | HR 62 | Ht 68.0 in | Wt 199.0 lb

## 2014-08-06 DIAGNOSIS — I509 Heart failure, unspecified: Secondary | ICD-10-CM | POA: Diagnosis not present

## 2014-08-06 DIAGNOSIS — I480 Paroxysmal atrial fibrillation: Secondary | ICD-10-CM

## 2014-08-06 DIAGNOSIS — I2510 Atherosclerotic heart disease of native coronary artery without angina pectoris: Secondary | ICD-10-CM | POA: Diagnosis not present

## 2014-08-06 DIAGNOSIS — I10 Essential (primary) hypertension: Secondary | ICD-10-CM | POA: Diagnosis not present

## 2014-08-06 DIAGNOSIS — Z9889 Other specified postprocedural states: Secondary | ICD-10-CM

## 2014-08-06 DIAGNOSIS — E785 Hyperlipidemia, unspecified: Secondary | ICD-10-CM

## 2014-08-06 DIAGNOSIS — I5032 Chronic diastolic (congestive) heart failure: Secondary | ICD-10-CM

## 2014-08-06 NOTE — Progress Notes (Signed)
Patient Name: [name] Date of Encounter: 08/06/2014  Primary Care Provider:  [name], MD Primary Cardiologist:  T. Turner, MD   Chief Complaint   81 year old male with a history of paroxysmal atrial fibrillation, diastolic heart failure, CAD, and prior mitral valve repair, who presents for follow-up related to recent PAF.  Past Medical History   Past Medical History  Diagnosis Date  . PAF (paroxysmal atrial fibrillation)     a. CHA2DS2VASc = 6 -->chronic coumadin;  b. Prev on amio-->d/c 2/2 hypothyroidism.  . Diverticulosis     Colonoscopy  . TIA (transient ischemic attack)   . Hyperlipidemia   . Hypertension   . Hemorrhoids   . Adenomatous colon polyp   . H/O mitral valve repair   . Chronic diastolic heart failure     a. 01/2014 Echo: EF 50-55%, no rwma, mild AS, mild MR, mildly dil LA. PASP 32 mmHg.  . Essential hypertension   . Dyslipidemia   . Current use of long term anticoagulation     a. Coumadin in setting of afib.  Marland Kitchen Hemorrhoids   . CAD (coronary artery disease)     a. S/P CABG x 1 @ time of MV annuloplasty;  b. 02/2010 ETT: neg for ischemia.   Past Surgical History  Procedure Laterality Date  . Coronary artery bypass graft    . Mitral valve repair      Allergies  No Known Allergies  HPI  81 year old male with the above problem list. He has a history of mitral valve repair with CABG 1 at that time. He also has a history of paroxysmal atrial fibrillation on chronic Coumadin therapy. In November 2015, he was admitted to [facility] with diastolic heart failure. He is very active at baseline and exercises in cardiac rehabilitation 3 times a week. He was at cardiac rehabilitation on May 16, and was noted to be in rate controlled atrial fibrillation in the 80s. He was completely asymptomatic. Our team was notified and it was advised that he was okay to exercise. He completed his exercise that day without any significant limitations or symptoms. An  appointment was made follow-up today. He is in sinus rhythm today. He denies chest pain, dyspnea, PND, orthopnea, dizziness, syncope, edema, early satiety, or palpitations. He is on chronic Coumadin with INRs followed at the [facility] in [place]. INR yesterday was 2.4.  Home Medications  Prior to Admission medications   Medication Sig Start Date End Date Taking? Authorizing Provider  Cholecalciferol (VITAMIN D-3 PO) Take 2,000 Units by mouth daily.   Yes Historical Provider, MD  diltiazem (CARDIZEM CD) 180 MG 24 hr capsule Take 1 capsule (180 mg total) by mouth daily. 02/09/14  Yes Debbe Odea, MD  docusate sodium (COLACE) 100 MG capsule Take 100 mg by mouth 2 (two) times daily.   Yes Historical Provider, MD  levothyroxine (SYNTHROID, LEVOTHROID) 50 MCG tablet Take 50 mcg by mouth daily before breakfast.   Yes Historical Provider, MD  NATURAL PSYLLIUM FIBER PO Take by mouth as directed.   Yes Historical Provider, MD  pravastatin (PRAVACHOL) 40 MG tablet Take 40 mg by mouth daily.   Yes Historical Provider, MD  warfarin (COUMADIN) 5 MG tablet Take 2.5-5 mg by mouth daily. 2.5mg  daily except on Wed and Sat take 5mg    Yes Historical Provider, MD  aspirin EC 81 MG tablet Take 81 mg by mouth daily.    Historical Provider, MD    Review of Systems  As  above, patient has been doing well.  He denies chest pain, palpitations, dyspnea, pnd, orthopnea, n, v, dizziness, syncope, edema, weight gain, or early satiety.  All other systems reviewed and are otherwise negative except as noted above.  Physical Exam  VS:  BP 120/50 mmHg  Pulse 62  Ht 5\' 8"  (1.727 m)  Wt 199 lb (90.266 kg)  BMI 30.26 kg/m2 , BMI Body mass index is 30.26 kg/(m^2). GEN: Well nourished, well developed, in no acute distress. HEENT: normal. Neck: Supple, no JVD, carotid bruits, or masses. Cardiac: RRR, 3/6 harsh SEM loudest @ RUSB.  No rubs, or gallops. No clubbing, cyanosis, edema.  Radials/DP/PT 2+ and equal  bilaterally.  Respiratory:  Respirations regular and unlabored, clear to auscultation bilaterally. GI: Soft, nontender, nondistended, BS + x 4. MS: no deformity or atrophy. Skin: warm and dry, no rash. Neuro:  Strength and sensation are intact. Psych: Normal affect.  Accessory Clinical Findings  ECG - regular sinus rhythm, 62, IVCD, no acute ST or T changes.  Assessment & Plan  1.  Paroxysmal atrial fibrillation: Per cardiac rehabilitation notes, patient had an episode of paroxysmal atrial fibrillation while in rehabilitation on May 16. He was completely asymptomatic and per notes, well rate controlled. He exercised that day without difficulty. He is on chronic Coumadin with a therapeutic INR yesterday, which is followed at the [facility]. He is in sinus rhythm today and baseline heart rate is 62. He remains on diltiazem 180 mg daily and given rate control while in A. fib with absence of symptoms, and relative baseline bradycardia, I will not titrate calcium channel blocker therapy today. Continue Coumadin anticoagulation as prescribed for CHA2DS2VASc of 6.  He is okay to continue exercising in cardiac rehabilitation.  2. History of mitral valve repair: Status post remote annuloplasty. Stable mitral valve as of echo November 2015.  3. Essential hypertension: Stable on calcium channel blocker therapy.  4. Hyperlipidemia: He is on Pravachol at home. Lipids are followed at the [place].  5. Chronic diastolic congestive heart failure: He is euvolemic on exam. Heart rate and blood pressure well controlled.  6. Stage III chronic disease: This is followed at the [place] and he is scheduled for a renal ultrasound next week.  7. Disposition: Follow-up with Dr. Radford Pax in 3 months or sooner if necessary.   Murray Hodgkins, NP 08/06/2014, 9:22 AM

## 2014-08-06 NOTE — Patient Instructions (Signed)
Medication Instructions:   Your physician recommends that you continue on your current medications as directed. Please refer to the Current Medication list given to you today.  Labwork:   Testing/Procedures:   Follow-UP  WITH DR TURNER IN 3 MONTHS  NEED TO CANCEL ALREADY SCHEDULED  APPT WITH DR TURNER   Any Other Special Instructions Will Be Listed Below (If Applicable).

## 2014-08-09 ENCOUNTER — Encounter (HOSPITAL_COMMUNITY)
Admission: RE | Admit: 2014-08-09 | Discharge: 2014-08-09 | Disposition: A | Discharge status: Home / Self Care | Payer: Medicare Other | Source: Ambulatory Visit | Attending: Cardiology | Admitting: Cardiology

## 2014-08-09 DIAGNOSIS — I509 Heart failure, unspecified: Secondary | ICD-10-CM | POA: Diagnosis not present

## 2014-08-09 NOTE — Progress Notes (Signed)
Patient was noted to be in atrial fibrillation at 3:31 pm rate 138. Patient asymptomatic exercise stopped. Blood pressure 98/60. Patient placed on 2l/min of oxygen. 12 lead ECG requested. Patient reported that he was in Artrial fibrillation on Saturday. Tupac said he had a low blood pressure in the 80's over the weekend and did not take his diltiazem for two days. Patient placed on oxygen at 2l/min. After about 15 minutes the patient spontaneously converted back to Sinus Rhythm rate 88. Repeat blood pressure 104/60. Danna Hefty NP called and notified. Gerald Stabs spoke with Mrs Ebanks over the phone and told her to change Mr Souder parameters for his diltiazem to hold for systolic blood pressure less than 90. Patient's wife states understanding. Oxygen discontinued. 12 lead ECG cancelled. Patient left cardiac rehab without complaints. Will continue to monitor the patient throughout  the program.

## 2014-08-11 ENCOUNTER — Encounter (HOSPITAL_COMMUNITY)
Admission: RE | Admit: 2014-08-11 | Discharge: 2014-08-11 | Disposition: A | Discharge status: Home / Self Care | Payer: Medicare Other | Source: Ambulatory Visit | Attending: Cardiology | Admitting: Cardiology

## 2014-08-11 DIAGNOSIS — I509 Heart failure, unspecified: Secondary | ICD-10-CM | POA: Diagnosis not present

## 2014-08-12 NOTE — Progress Notes (Signed)
Patient was in Sinus rhythm yesterday and exercised without difficulty.

## 2014-08-13 ENCOUNTER — Encounter (HOSPITAL_COMMUNITY)
Admission: RE | Admit: 2014-08-13 | Discharge: 2014-08-13 | Disposition: A | Discharge status: Home / Self Care | Payer: Medicare Other | Source: Ambulatory Visit | Attending: Cardiology | Admitting: Cardiology

## 2014-08-13 ENCOUNTER — Telehealth: Payer: Self-pay

## 2014-08-13 DIAGNOSIS — I509 Heart failure, unspecified: Secondary | ICD-10-CM | POA: Diagnosis not present

## 2014-08-13 DIAGNOSIS — I480 Paroxysmal atrial fibrillation: Secondary | ICD-10-CM

## 2014-08-13 MED ORDER — AMIODARONE HCL 200 MG PO TABS: 200.0000 mg | ORAL_TABLET | Freq: Two times a day (BID) | ORAL | Status: DC

## 2014-08-13 MED ORDER — DILTIAZEM HCL ER COATED BEADS 120 MG PO CP24: 120.0000 mg | ORAL_CAPSULE | Freq: Every day | ORAL | Status: DC

## 2014-08-13 NOTE — Telephone Encounter (Signed)
Per Dr. Radford Pax, instructed patient:  1) to START AMIODARONE 200 mg BID 2) to DECREASE DILTIAZEM to 120 mg daily and to hold for SBP less than 100 3) to wear Holter monitor to assess Afib  An EKG has been scheduled for 6/6. FU OV with [name] is scheduled for 6/23. FU OV with Dr. Radford Pax scheduled for 3 months.  Patient agrees with treatment plan. Coumadin Clinic notified of med change.

## 2014-08-18 ENCOUNTER — Encounter (HOSPITAL_COMMUNITY)
Admission: RE | Admit: 2014-08-18 | Discharge: 2014-08-18 | Disposition: A | Discharge status: Home / Self Care | Payer: Medicare Other | Source: Ambulatory Visit | Attending: Cardiology | Admitting: Cardiology

## 2014-08-18 DIAGNOSIS — I509 Heart failure, unspecified: Secondary | ICD-10-CM | POA: Insufficient documentation

## 2014-08-18 NOTE — Progress Notes (Signed)
[name] 81 y.o. male Nutrition Note Spoke with pt and pt's wife. Nutrition Plan and Nutrition Survey goals reviewed with pt. Pt is following Step 1 of the Therapeutic Lifestyle Changes diet. Pt with dx of CHF. Per discussion, pt does not use canned/convenience foods often. Pt rarely adds salt to food. Pt eats out 2-3 meals/week and watches sodium content of foods when eating out. Pt on Coumadin. Per discussion, pt tries to follow a diet with consistent vitamin K intake. Age-appropriate nutrition recommendations discussed. Pt expressed understanding of the information reviewed. Pt aware of nutrition education classes offered.  No results found for: HGBA1C  Nutrition Diagnosis ? Food-and nutrition-related knowledge deficit related to lack of exposure to information as related to diagnosis of: ? CVD  ? Overweight related to excessive energy intake as evidenced by a BMI of 29.7  Nutrition RX/ Estimated Daily Nutrition Needs for: wt oss 1400-1900 Kcal, 35-50 gm fat, 9-13 gm sat fat, 1.4-1.9 gm trans-fat, <1500 mg sodium Nutrition Intervention ? Pt's individual nutrition plan reviewed with pt. ? Benefits of adopting Therapeutic Lifestyle Changes discussed when Medficts reviewed. ? Pt to attend the Portion Distortion class ? Pt to attend the   ? Nutrition I class                  ? Nutrition II class - met 07/27/14     ? Continue client-centered nutrition education by RD, as part of interdisciplinary care. Goal(s) ? Pt to identify food quantities necessary to achieve: ? wt loss to a goal wt of 176-194 lb (80.2-88.4 kg) at graduation from cardiac rehab.  ? Pt to describe the benefit of including fruits, vegetables, whole grains, and low-fat dairy products in a heart healthy meal plan. Monitor and Evaluate progress toward nutrition goal with team. Nutrition Risk: Change to Moderate [name], M.Ed, RD, LDN, CDE 08/18/2014 3:57 PM

## 2014-08-20 ENCOUNTER — Encounter (HOSPITAL_COMMUNITY)
Admission: RE | Admit: 2014-08-20 | Discharge: 2014-08-20 | Disposition: A | Discharge status: Home / Self Care | Payer: Medicare Other | Source: Ambulatory Visit | Attending: Cardiology | Admitting: Cardiology

## 2014-08-20 DIAGNOSIS — I509 Heart failure, unspecified: Secondary | ICD-10-CM | POA: Diagnosis not present

## 2014-08-23 ENCOUNTER — Encounter (HOSPITAL_COMMUNITY)
Admission: RE | Admit: 2014-08-23 | Discharge: 2014-08-23 | Disposition: A | Discharge status: Home / Self Care | Payer: Medicare Other | Source: Ambulatory Visit | Attending: Cardiology | Admitting: Cardiology

## 2014-08-23 ENCOUNTER — Ambulatory Visit (INDEPENDENT_AMBULATORY_CARE_PROVIDER_SITE_OTHER): Payer: Medicare Other | Admitting: Nurse Practitioner

## 2014-08-23 VITALS — BP 140/68 | HR 62 | Wt 199.0 lb

## 2014-08-23 DIAGNOSIS — I480 Paroxysmal atrial fibrillation: Secondary | ICD-10-CM | POA: Diagnosis not present

## 2014-08-23 DIAGNOSIS — I509 Heart failure, unspecified: Secondary | ICD-10-CM | POA: Diagnosis not present

## 2014-08-23 NOTE — Patient Instructions (Signed)
Medication Instructions:  Your physician recommends that you continue on your current medications as directed. Please refer to the Current Medication list given to you today.   Labwork: None Ordered  Testing/Procedures: None Ordered  Follow-Up: Your physician recommends that you schedule a follow-up appointment in: as directed - see appointment schedule above

## 2014-08-23 NOTE — Progress Notes (Signed)
1.) Reason for visit: Here for follow-up for Amiodarone therapy started 5/27  2.) Name of MD requesting visit: Dr. Radford Pax  3.) H&P: Hx PAF; on 5/27 Amiodarone started at 200 mg BID, Diltiazem decreased to 120 mg QD  4.) ROS related to problem: Patient denies complaints; states he is feeling well since medication changes on 5/27; patient alert & oriented to person, place, time; in NAD; ambulates without difficulty   5.) Assessment and plan per MD: Keep follow-up appointments as scheduled; scheduled for holter monitor on 6/9 to assess frequency of atrial fib and f/u with [name], PA-C on 6/23  Advised patient to call back with questions or concerns

## 2014-08-25 ENCOUNTER — Encounter (HOSPITAL_COMMUNITY)
Admission: RE | Admit: 2014-08-25 | Discharge: 2014-08-25 | Disposition: A | Discharge status: Home / Self Care | Payer: Medicare Other | Source: Ambulatory Visit | Attending: Cardiology | Admitting: Cardiology

## 2014-08-25 DIAGNOSIS — I509 Heart failure, unspecified: Secondary | ICD-10-CM | POA: Diagnosis not present

## 2014-08-26 ENCOUNTER — Ambulatory Visit (INDEPENDENT_AMBULATORY_CARE_PROVIDER_SITE_OTHER): Payer: Medicare Other

## 2014-08-26 DIAGNOSIS — I480 Paroxysmal atrial fibrillation: Secondary | ICD-10-CM

## 2014-08-27 ENCOUNTER — Encounter (HOSPITAL_COMMUNITY)
Admission: RE | Admit: 2014-08-27 | Discharge: 2014-08-27 | Disposition: A | Discharge status: Home / Self Care | Payer: Medicare Other | Source: Ambulatory Visit | Attending: Cardiology | Admitting: Cardiology

## 2014-08-27 DIAGNOSIS — I509 Heart failure, unspecified: Secondary | ICD-10-CM | POA: Diagnosis not present

## 2014-08-30 ENCOUNTER — Encounter (HOSPITAL_COMMUNITY)
Admission: RE | Admit: 2014-08-30 | Discharge: 2014-08-30 | Disposition: A | Discharge status: Home / Self Care | Payer: Medicare Other | Source: Ambulatory Visit | Attending: Cardiology | Admitting: Cardiology

## 2014-08-30 ENCOUNTER — Encounter: Payer: Self-pay | Admitting: Cardiology

## 2014-08-30 DIAGNOSIS — I509 Heart failure, unspecified: Secondary | ICD-10-CM | POA: Diagnosis not present

## 2014-09-01 ENCOUNTER — Encounter (HOSPITAL_COMMUNITY)
Admission: RE | Admit: 2014-09-01 | Discharge: 2014-09-01 | Disposition: A | Discharge status: Home / Self Care | Payer: Medicare Other | Source: Ambulatory Visit | Attending: Cardiology | Admitting: Cardiology

## 2014-09-01 DIAGNOSIS — I509 Heart failure, unspecified: Secondary | ICD-10-CM | POA: Diagnosis not present

## 2014-09-02 ENCOUNTER — Ambulatory Visit: Payer: Medicare Other | Admitting: Cardiology

## 2014-09-03 ENCOUNTER — Encounter (HOSPITAL_COMMUNITY)
Admission: RE | Admit: 2014-09-03 | Discharge: 2014-09-03 | Disposition: A | Discharge status: Home / Self Care | Payer: Medicare Other | Source: Ambulatory Visit | Attending: Cardiology | Admitting: Cardiology

## 2014-09-03 DIAGNOSIS — I509 Heart failure, unspecified: Secondary | ICD-10-CM | POA: Diagnosis not present

## 2014-09-06 ENCOUNTER — Encounter (HOSPITAL_COMMUNITY)
Admission: RE | Admit: 2014-09-06 | Discharge: 2014-09-06 | Disposition: A | Discharge status: Home / Self Care | Payer: Medicare Other | Source: Ambulatory Visit | Attending: Cardiology | Admitting: Cardiology

## 2014-09-06 DIAGNOSIS — I509 Heart failure, unspecified: Secondary | ICD-10-CM | POA: Diagnosis not present

## 2014-09-08 ENCOUNTER — Encounter (HOSPITAL_COMMUNITY)
Admission: RE | Admit: 2014-09-08 | Discharge: 2014-09-08 | Disposition: A | Discharge status: Home / Self Care | Payer: Medicare Other | Source: Ambulatory Visit | Attending: Cardiology | Admitting: Cardiology

## 2014-09-08 DIAGNOSIS — I509 Heart failure, unspecified: Secondary | ICD-10-CM | POA: Diagnosis not present

## 2014-09-08 NOTE — Progress Notes (Signed)
Cardiology Office Note   Date:  09/09/2014   ID:  [name], DOB 27-Nov-1931, MRN [id number]  PCP:  [name], MD  Cardiologist:  Dr. Fransico Him     Chief Complaint  Patient presents with  . Atrial Fibrillation     History of Present Illness: [name] is a 81 y.o. male with a hx of PAF, diastolic HF, CAD status post CABG + mitral valve repair, prior TIA, HTN, HL. He is on chronic Coumadin therapy for anticoagulation. Admitted in 01/2014 with generalized weakness.  TSH was 25.  He was started on Synthroid.  He was noted to have pulmonary infiltrates and high res CT was neg for amiodarone lung toxicity.  Echo demonstrated elevated filling pressures and he was diuresed.  He was eventually taken off of Amiodarone b/c of hypothyroidism.  Last seen in this office 08/06/14. He was noted to be in atrial fibrillation with controlled rate at cardiac rehabilitation in May. When last seen, he was in normal sinus rhythm. Coumadin is followed at the [facility]. After his last visit, he was noted to be in atrial fibrillation again at cardiac rehabilitation with RVR. He was placed back on amiodarone by Dr. Radford Pax. Holter monitor was obtained and demonstrated NSR, PACs, PVCs. No obvious atrial fibrillation was noted.  He returns for follow-up.  Here with his wife.  Notes fatigue over past couple of days.  No chest pain.  No significant DOE.  No orthopnea, PND, edema. No syncope.  No cough or wheezing. No skin changes, constipation, somnolence.     Studies/Reports Reviewed Today:  Holter x 48 hrs 08/26/14  Normal sinus bradycardia to sinus rhythm with average heart rate 65 bpm. Heart rate ranged from 38 to 97 bpm.  PAC's and atrial couplets  PVC's  one tracing with very bad baseline artifact and somewhat irregular and could not accurately interpret. Recommend event monitor to assess for PAF.  Echo 02/07/14 - EF 50% to 55%. Wall motion was normal;  - Aortic valve: Valve  mobility was restricted. There was mildstenosis. - Mitral valve: Prior procedures included surgical repair. Therewas mild regurgitation. Valve area by continuity equation (using LVOT flow): 2.45 cm^2. - Left atrium: The atrium was mildly dilated. - Pulmonary arteries: Systolic pressure was mildly increased. PApeak pressure: 32 mm Hg (S). Impressions:- Low normal LV function; EF 50-55; elevated left ventricular filling pressures; mild LAE; mild AS (mean gradient 14 mmHg); s/pMV repair with mild MR; mildly elevated pulmonary pressures.  ETT 02/28/10 No ECG changes   Past Medical History  Diagnosis Date  . PAF (paroxysmal atrial fibrillation)     a. CHA2DS2VASc = 6 -->chronic coumadin;  b. Prev on amio-->d/c 2/2 hypothyroidism. >> resumed 5/16  . Diverticulosis     Colonoscopy  . TIA (transient ischemic attack)   . Hyperlipidemia   . Hypertension   . Hemorrhoids   . Adenomatous colon polyp   . H/O mitral valve repair   . Chronic diastolic heart failure     a. 01/2014 Echo: EF 50-55%, no rwma, mild AS, mild MR, mildly dil LA. PASP 32 mmHg.  . Essential hypertension   . Dyslipidemia   . Current use of long term anticoagulation     a. Coumadin in setting of afib.  Marland Kitchen Hemorrhoids   . CAD (coronary artery disease)     a. S/P CABG x 1 @ time of MV annuloplasty;  b. 02/2010 ETT: neg for ischemia.    Past Surgical History  Procedure Laterality Date  . Coronary artery bypass graft    . Mitral valve repair       Current Outpatient Prescriptions  Medication Sig Dispense Refill  . amiodarone (PACERONE) 200 MG tablet Take 1 tablet (200 mg total) by mouth daily. 30 tablet 6  . aspirin EC 81 MG tablet Take 81 mg by mouth daily.    . Cholecalciferol (VITAMIN D-3 PO) Take 2,000 Units by mouth daily.    Marland Kitchen diltiazem (CARDIZEM CD) 120 MG 24 hr capsule Take 1 capsule (120 mg total) by mouth daily. Hold if systolic blood pressure (the top number) is less than 100. 30 capsule 3  .  docusate sodium (COLACE) 100 MG capsule Take 100 mg by mouth 2 (two) times daily.    Marland Kitchen levothyroxine (SYNTHROID, LEVOTHROID) 50 MCG tablet Take 50 mcg by mouth daily before breakfast.    . NATURAL PSYLLIUM FIBER PO Take by mouth as directed.    . pravastatin (PRAVACHOL) 40 MG tablet Take 40 mg by mouth daily.    Marland Kitchen warfarin (COUMADIN) 5 MG tablet Take 2.5-5 mg by mouth daily. 2.5mg  daily except on Wed and Sat take 5mg      No current facility-administered medications for this visit.    Allergies:   Review of patient's allergies indicates no known allergies.    Social History:  The patient  reports that he has never smoked. He has never used smokeless tobacco. He reports that he does not drink alcohol or use illicit drugs.   Family History:  The patient's family history includes Heart disease in his father; Heart failure in his brother; Stroke in his brother and mother.    ROS:   Please see the history of present illness.   Review of Systems  Constitution: Positive for malaise/fatigue.  All other systems reviewed and are negative.     PHYSICAL EXAM: VS:  BP 140/50 mmHg  Pulse 63  Ht 5\' 8"  (1.727 m)  Wt 196 lb (88.905 kg)  BMI 29.81 kg/m2    Wt Readings from Last 3 Encounters:  09/09/14 196 lb (88.905 kg)  08/23/14 199 lb (90.266 kg)  08/06/14 199 lb (90.266 kg)     GEN: Well nourished, well developed, in no acute distress HEENT: normal Neck: no JVD,   no masses Cardiac:  Normal S1/S2, RRR; no murmur ,  no rubs or gallops, no edema   Respiratory:  clear to auscultation bilaterally, no wheezing, rhonchi or rales. GI: soft, nontender, nondistended, + BS MS: no deformity or atrophy Skin: warm and dry  Neuro:  CNs II-XII intact, Strength and sensation are intact Psych: Normal affect   EKG:  EKG is ordered today.  It demonstrates:   NSR, HR 63, IVCD, no change from prior tracing, QTc 460   Recent Labs: 02/06/2014: Pro B Natriuretic peptide (BNP) 198.9 02/07/2014: ALT 35;  Hemoglobin 15.6; Platelets 130*; TSH 25.310* 02/09/2014: BUN 37*; Creatinine, Ser 1.82*; Potassium 4.6; Sodium 140    Lipid Panel    Component Value Date/Time   CHOL 154 11/24/2010 0258   TRIG 50 11/24/2010 0258   HDL 41 11/24/2010 0258   CHOLHDL 3.8 11/24/2010 0258   VLDL 10 11/24/2010 0258   LDLCALC 103* 11/24/2010 0258      ASSESSMENT AND PLAN:  Other fatigue: Etiology not clear. His amiodarone was discontinued after his admission to the hospital in November in the setting of significant hypothyroidism. He is now back on amiodarone secondary to recurrent atrial fibrillation. I will obtain BMET,  CBC, TSH and LFTs today.   PAF (paroxysmal atrial fibrillation):  Continue amiodarone for now. Decrease dose to 200 mg daily. Coumadin is managed at the [facility]. Continue low-dose Cardizem CD 120 mg daily. As noted, TSH and LFTs will be obtained today. If his TSH continues to increase in the setting of amiodarone, we may need to consider an alternative rhythm strategy. We could consider Tikosyn.  Coronary artery disease involving native coronary artery of native heart without angina pectoris: No angina. He is currently on aspirin in addition to Coumadin, statin and calcium channel blocker.  H/O mitral valve repair: Stable by recent echocardiogram with mild mitral regurgitation. Continue SBE prophylaxis.  Aortic stenosis: Mild by recent echocardiogram with mean gradient 14 mmHg.  Essential hypertension: Controlled.  Chronic diastolic heart failure: Volume stable. He is not currently on diuretic.  Dyslipidemia: Continue statin.  Chronic kidney disease (CKD), stage III (moderate): Check BMET today.    Medication Changes: Current medicines are reviewed at length with the patient today.  Concerns regarding medicines are as outlined above.  The following changes have been made:   Discontinued Medications   No medications on file   Modified Medications   Modified Medication  Previous Medication   AMIODARONE (PACERONE) 200 MG TABLET amiodarone (PACERONE) 200 MG tablet      Take 1 tablet (200 mg total) by mouth daily.    Take 1 tablet (200 mg total) by mouth 2 (two) times daily.   New Prescriptions   No medications on file    Labs/ tests ordered today include:   Orders Placed This Encounter  Procedures  . Basic Metabolic Panel (BMET)  . CBC  . TSH  . Hepatic function panel  . EKG 12-Lead     Disposition:   FU with Dr. Fransico Him Aug 2016 as planned.    Signed, Versie Starks, MHS 09/09/2014 4:29 PM    [facility] [address], [place], [place]  [address] Phone: [phone]; Fax: [phone]

## 2014-09-09 ENCOUNTER — Ambulatory Visit (INDEPENDENT_AMBULATORY_CARE_PROVIDER_SITE_OTHER): Payer: Medicare Other | Admitting: Physician Assistant

## 2014-09-09 ENCOUNTER — Encounter: Payer: Self-pay | Admitting: Physician Assistant

## 2014-09-09 VITALS — BP 140/50 | HR 63 | Ht 68.0 in | Wt 196.0 lb

## 2014-09-09 DIAGNOSIS — I2510 Atherosclerotic heart disease of native coronary artery without angina pectoris: Secondary | ICD-10-CM

## 2014-09-09 DIAGNOSIS — I10 Essential (primary) hypertension: Secondary | ICD-10-CM

## 2014-09-09 DIAGNOSIS — Z9889 Other specified postprocedural states: Secondary | ICD-10-CM

## 2014-09-09 DIAGNOSIS — I480 Paroxysmal atrial fibrillation: Secondary | ICD-10-CM

## 2014-09-09 DIAGNOSIS — I5032 Chronic diastolic (congestive) heart failure: Secondary | ICD-10-CM

## 2014-09-09 DIAGNOSIS — R5383 Other fatigue: Secondary | ICD-10-CM | POA: Diagnosis not present

## 2014-09-09 DIAGNOSIS — N183 Chronic kidney disease, stage 3 unspecified: Secondary | ICD-10-CM

## 2014-09-09 DIAGNOSIS — E785 Hyperlipidemia, unspecified: Secondary | ICD-10-CM

## 2014-09-09 MED ORDER — AMIODARONE HCL 200 MG PO TABS: 200.0000 mg | ORAL_TABLET | Freq: Every day | ORAL | Status: DC

## 2014-09-09 NOTE — Patient Instructions (Addendum)
Medication Instructions:   START TAKING AMIODARONE 200 MG ONCE A DAY     Labwork:  CBC BMET TSH  LFT    Testing/Procedures:   Follow-Up:  WITH DR Radford Pax IN [place]   Any Other Special Instructions Will Be Listed Below (If Applicable).

## 2014-09-10 ENCOUNTER — Encounter (HOSPITAL_COMMUNITY)
Admission: RE | Admit: 2014-09-10 | Discharge: 2014-09-10 | Disposition: A | Discharge status: Home / Self Care | Payer: Medicare Other | Source: Ambulatory Visit | Attending: Cardiology | Admitting: Cardiology

## 2014-09-10 ENCOUNTER — Telehealth: Payer: Self-pay | Admitting: *Deleted

## 2014-09-10 DIAGNOSIS — I509 Heart failure, unspecified: Secondary | ICD-10-CM | POA: Diagnosis not present

## 2014-09-10 LAB — HEPATIC FUNCTION PANEL
ALBUMIN: 4.3 g/dL (ref 3.5–5.2)
ALK PHOS: 61 U/L (ref 39–117)
ALT: 19 U/L (ref 0–53)
AST: 19 U/L (ref 0–37)
Bilirubin, Direct: 0.1 mg/dL (ref 0.0–0.3)
TOTAL PROTEIN: 7.1 g/dL (ref 6.0–8.3)
Total Bilirubin: 0.6 mg/dL (ref 0.2–1.2)

## 2014-09-10 LAB — CBC
HCT: 46 % (ref 39.0–52.0)
Hemoglobin: 15.5 g/dL (ref 13.0–17.0)
MCHC: 33.7 g/dL (ref 30.0–36.0)
MCV: 91.1 fl (ref 78.0–100.0)
Platelets: 149 10*3/uL — ABNORMAL LOW (ref 150.0–400.0)
RBC: 5.05 Mil/uL (ref 4.22–5.81)
RDW: 14.6 % (ref 11.5–15.5)
WBC: 6.1 10*3/uL (ref 4.0–10.5)

## 2014-09-10 LAB — BASIC METABOLIC PANEL
BUN: 34 mg/dL — ABNORMAL HIGH (ref 6–23)
CALCIUM: 9.5 mg/dL (ref 8.4–10.5)
CO2: 29 meq/L (ref 19–32)
CREATININE: 1.81 mg/dL — AB (ref 0.40–1.50)
Chloride: 102 mEq/L (ref 96–112)
GFR: 38.48 mL/min — AB (ref >60.00)
GLUCOSE: 109 mg/dL — AB (ref 70–99)
Potassium: 4.1 mEq/L (ref 3.5–5.1)
Sodium: 139 mEq/L (ref 135–145)

## 2014-09-10 LAB — TSH: TSH: 2.21 u[IU]/mL (ref 0.35–4.50)

## 2014-09-10 NOTE — Telephone Encounter (Signed)
Pt notified of lab results by phone w/verbal understanding to results given.

## 2014-09-13 ENCOUNTER — Encounter (HOSPITAL_COMMUNITY)
Admission: RE | Admit: 2014-09-13 | Discharge: 2014-09-13 | Disposition: A | Discharge status: Home / Self Care | Payer: Medicare Other | Source: Ambulatory Visit | Attending: Cardiology | Admitting: Cardiology

## 2014-09-13 DIAGNOSIS — I509 Heart failure, unspecified: Secondary | ICD-10-CM | POA: Diagnosis not present

## 2014-09-15 ENCOUNTER — Encounter (HOSPITAL_COMMUNITY)
Admission: RE | Admit: 2014-09-15 | Discharge: 2014-09-15 | Disposition: A | Discharge status: Home / Self Care | Payer: Medicare Other | Source: Ambulatory Visit | Attending: Cardiology | Admitting: Cardiology

## 2014-09-15 DIAGNOSIS — I509 Heart failure, unspecified: Secondary | ICD-10-CM | POA: Diagnosis not present

## 2014-09-17 ENCOUNTER — Encounter (HOSPITAL_COMMUNITY)
Admission: RE | Admit: 2014-09-17 | Discharge: 2014-09-17 | Disposition: A | Discharge status: Home / Self Care | Payer: Medicare Other | Source: Ambulatory Visit | Attending: Cardiology | Admitting: Cardiology

## 2014-09-17 DIAGNOSIS — I509 Heart failure, unspecified: Secondary | ICD-10-CM | POA: Diagnosis present

## 2014-09-22 ENCOUNTER — Encounter (HOSPITAL_COMMUNITY)
Admission: RE | Admit: 2014-09-22 | Discharge: 2014-09-22 | Disposition: A | Discharge status: Home / Self Care | Payer: Medicare Other | Source: Ambulatory Visit | Attending: Cardiology | Admitting: Cardiology

## 2014-09-22 DIAGNOSIS — I509 Heart failure, unspecified: Secondary | ICD-10-CM | POA: Diagnosis not present

## 2014-09-24 ENCOUNTER — Encounter (HOSPITAL_COMMUNITY)
Admission: RE | Admit: 2014-09-24 | Discharge: 2014-09-24 | Disposition: A | Discharge status: Home / Self Care | Payer: Medicare Other | Source: Ambulatory Visit | Attending: Cardiology | Admitting: Cardiology

## 2014-09-24 DIAGNOSIS — I509 Heart failure, unspecified: Secondary | ICD-10-CM | POA: Diagnosis not present

## 2014-09-27 ENCOUNTER — Encounter (HOSPITAL_COMMUNITY)
Admission: RE | Admit: 2014-09-27 | Discharge: 2014-09-27 | Disposition: A | Discharge status: Home / Self Care | Payer: Medicare Other | Source: Ambulatory Visit | Attending: Cardiology | Admitting: Cardiology

## 2014-09-27 DIAGNOSIS — I509 Heart failure, unspecified: Secondary | ICD-10-CM | POA: Diagnosis not present

## 2014-09-27 LAB — GLUCOSE, CAPILLARY
GLUCOSE-CAPILLARY: 71 mg/dL (ref 65–99)
Glucose-Capillary: 98 mg/dL (ref 65–99)

## 2014-09-27 NOTE — Progress Notes (Signed)
Patient reported that his arms felt weak while riding the airdyne at cardiac rehab. The patient was sitting down on the nustep appeared diaphoretic.  Sitting blood pressure 124/60 sitting. Standing blood pressure 124/70. Telemetry rhythm Sinus. [name] was diaphoretic. CBG 71. Exercise stopped. Patient was given lemonade and a banana.  Recheck CBG 98. Patient denied further complaints or symptoms. Dr Azalia Bilis office called and notified about today's event. Will fax exercise flow sheets to Dr. Doreene Adas office for review. Exit blood pressure 124/70. [name] went to the [place] earlier today ate lunch at 1:00pm and drove straight to cardiac rehab to exercise. Will continue to monitor the patient throughout  the program.

## 2014-09-29 ENCOUNTER — Encounter (HOSPITAL_COMMUNITY)
Admission: RE | Admit: 2014-09-29 | Discharge: 2014-09-29 | Disposition: A | Discharge status: Home / Self Care | Payer: Medicare Other | Source: Ambulatory Visit | Attending: Cardiology | Admitting: Cardiology

## 2014-09-29 DIAGNOSIS — I509 Heart failure, unspecified: Secondary | ICD-10-CM | POA: Diagnosis not present

## 2014-10-01 ENCOUNTER — Encounter (HOSPITAL_COMMUNITY)
Admission: RE | Admit: 2014-10-01 | Discharge: 2014-10-01 | Disposition: A | Discharge status: Home / Self Care | Payer: Medicare Other | Source: Ambulatory Visit | Attending: Cardiology | Admitting: Cardiology

## 2014-10-01 DIAGNOSIS — I509 Heart failure, unspecified: Secondary | ICD-10-CM | POA: Diagnosis not present

## 2014-10-04 ENCOUNTER — Encounter (HOSPITAL_COMMUNITY)
Admission: RE | Admit: 2014-10-04 | Discharge: 2014-10-04 | Disposition: A | Discharge status: Home / Self Care | Payer: Medicare Other | Source: Ambulatory Visit | Attending: Cardiology | Admitting: Cardiology

## 2014-10-04 ENCOUNTER — Telehealth: Payer: Self-pay | Admitting: Physician Assistant

## 2014-10-04 DIAGNOSIS — I509 Heart failure, unspecified: Secondary | ICD-10-CM | POA: Diagnosis not present

## 2014-10-04 NOTE — Progress Notes (Signed)
Patient noted to be in atrial fib this afternoon. Patient asymptomatic. Blood pressure 116/62. Sharrell [facility] notified. Hinton Dyer said mr Sare is okay to exercise at cardiac rehab. Hinton Dyer said she will notify Dr Radford Pax about Mr Leiner being in Atrial Fibrillation. The patient's wife said Mr Gailliard has been "out of rhythm all weekend. No complaints during exercise. Will continue to monitor the patient throughout  the program.

## 2014-10-04 NOTE — Telephone Encounter (Signed)
Maria with cardiac rehab called to let us know the patient was noted to be going in and out of afib during cardiac rehab session. HR 80s-110 max. He is totally asymptomatic with his. BP 116/60, pulse ox 98%. No awareness of this. Took home meds today. Is on amiodarone 200mg  daily - maintained on lower dose due to h/o thyroid abnormalities. Last TSH 6/22 was normal. He is anticoagulated with warfarin. Prior Holter in 08/2014 showed sinus bradycardia to NSR with HR 38->97. Looks like avg heart rates were in the 60s. This may make pushing up diltiazem less ideal. Last office note by [name] says to consider Tikosyn if he has recurrent AF despite amiodarone.  Has CKD which may limit options. Cannot use flecainide due to h/o CAD and has h/o of CHF so no Multaq.   Will forward to Dr. Radford Pax for review. Last EKG in sinus showed QTc 459ms but with NSIVCD 172ms. ?Refer to afib clinic to discuss Tikosyn.  Kylii Ennis PA-C

## 2014-10-04 NOTE — Telephone Encounter (Signed)
Refer to afib clinic

## 2014-10-05 NOTE — Telephone Encounter (Signed)
Left message to call back  

## 2014-10-06 ENCOUNTER — Encounter (HOSPITAL_COMMUNITY)
Admission: RE | Admit: 2014-10-06 | Discharge: 2014-10-06 | Disposition: A | Discharge status: Home / Self Care | Payer: Medicare Other | Source: Ambulatory Visit | Attending: Cardiology | Admitting: Cardiology

## 2014-10-06 DIAGNOSIS — I509 Heart failure, unspecified: Secondary | ICD-10-CM | POA: Diagnosis not present

## 2014-10-06 NOTE — Telephone Encounter (Signed)
Informed patient and his wife of Dr. Radford Pax and Dayna's recommendations.  Patient agrees to [facility] referral. OV scheduled for tomorrow at 1400. Directions and phone number to A-fib clinic given.

## 2014-10-07 ENCOUNTER — Telehealth: Payer: Self-pay | Admitting: Cardiology

## 2014-10-07 ENCOUNTER — Ambulatory Visit (HOSPITAL_COMMUNITY)
Admission: RE | Admit: 2014-10-07 | Discharge: 2014-10-07 | Disposition: A | Discharge status: Home / Self Care | Payer: Medicare Other | Source: Ambulatory Visit | Attending: Nurse Practitioner | Admitting: Nurse Practitioner

## 2014-10-07 ENCOUNTER — Encounter (HOSPITAL_COMMUNITY): Payer: Self-pay | Admitting: Nurse Practitioner

## 2014-10-07 VITALS — BP 132/80 | HR 72 | Ht 68.0 in | Wt 196.8 lb

## 2014-10-07 DIAGNOSIS — I110 Hypertensive heart disease with heart failure: Secondary | ICD-10-CM | POA: Diagnosis not present

## 2014-10-07 DIAGNOSIS — I5032 Chronic diastolic (congestive) heart failure: Secondary | ICD-10-CM | POA: Insufficient documentation

## 2014-10-07 DIAGNOSIS — I480 Paroxysmal atrial fibrillation: Secondary | ICD-10-CM

## 2014-10-07 DIAGNOSIS — I2510 Atherosclerotic heart disease of native coronary artery without angina pectoris: Secondary | ICD-10-CM | POA: Insufficient documentation

## 2014-10-07 NOTE — Progress Notes (Signed)
Patient ID: [name], male   DOB: 11/17/1931, 81 y.o.   MRN: [id number]     Primary Care Physician: [name], MD Referring Physician: Cardiac rehab   [name] is a 81 y.o. male with a h/o PAF, diastolic HF, CAD status post CABG + mitral valve repair, prior TIA, HTN, HL. He is on chronic Coumadin therapy for anticoagulation. Admitted in 01/2014 with generalized weakness. TSH was 25. He was started on Synthroid. He was noted to have pulmonary infiltrates and high res CT was neg for amiodarone lung toxicity. Echo demonstrated elevated filling pressures and he was diuresed. He was eventually taken off of Amiodarone b/c of hypothyroidism.  When seen by Dr. Radford Pax, 08/06/14, he was reported to be in atrial fibrillation with controlled rate at cardiac rehabilitation in May.  He was in normal sinus rhythm on visit. Coumadin is followed at the [facility]. After his last visit, he was noted to be in atrial fibrillation again at cardiac rehabilitation with RVR. He was placed back on amiodarone by Dr. Radford Pax. Holter monitor was obtained and demonstrated NSR, PACs, PVCs. No obvious atrial fibrillation was noted.  He is in the afib clinic due to presence  of afib during cardiac rehab. Today in office he is in [place]. The wife states he was started on generic flomax, 7/11, within 24 hours he was in afib. He talked to the MD at the [facility] that prescribed and was told to stop drug. Within 24 hours, he was back in SR. He did not notice any benefit to urination when using drug. He reports that he feels well Did have fatigue when in afib. Just had INR checked and was at 2.4.   Today, he denies symptoms of palpitations, chest pain, shortness of breath, orthopnea, PND, lower extremity edema, dizziness, presyncope, syncope, or neurologic sequela. The patient is tolerating medications without difficulties and is otherwise without complaint today.   Past Medical History  Diagnosis Date  . PAF  (paroxysmal atrial fibrillation)     a. CHA2DS2VASc = 6 -->chronic coumadin;  b. Prev on amio-->d/c 2/2 hypothyroidism. >> resumed 5/16  . Diverticulosis     Colonoscopy  . TIA (transient ischemic attack)   . Hyperlipidemia   . Hypertension   . Hemorrhoids   . Adenomatous colon polyp   . H/O mitral valve repair   . Chronic diastolic heart failure     a. 01/2014 Echo: EF 50-55%, no rwma, mild AS, mild MR, mildly dil LA. PASP 32 mmHg.  . Essential hypertension   . Dyslipidemia   . Current use of long term anticoagulation     a. Coumadin in setting of afib.  Marland Kitchen Hemorrhoids   . CAD (coronary artery disease)     a. S/P CABG x 1 @ time of MV annuloplasty;  b. 02/2010 ETT: neg for ischemia.   Past Surgical History  Procedure Laterality Date  . Coronary artery bypass graft    . Mitral valve repair      Current Outpatient Prescriptions  Medication Sig Dispense Refill  . amiodarone (PACERONE) 200 MG tablet Take 1 tablet (200 mg total) by mouth daily. 30 tablet 6  . aspirin EC 81 MG tablet Take 81 mg by mouth daily.    . Cholecalciferol (VITAMIN D-3 PO) Take 2,000 Units by mouth daily.    Marland Kitchen diltiazem (CARDIZEM CD) 120 MG 24 hr capsule Take 1 capsule (120 mg total) by mouth daily. Hold if systolic blood pressure (the top number) is  less than 100. 30 capsule 3  . docusate sodium (COLACE) 100 MG capsule Take 100 mg by mouth 2 (two) times daily.    Marland Kitchen levothyroxine (SYNTHROID, LEVOTHROID) 50 MCG tablet Take 50 mcg by mouth daily before breakfast.    . NATURAL PSYLLIUM FIBER PO Take by mouth as directed.    . pravastatin (PRAVACHOL) 40 MG tablet Take 40 mg by mouth daily.    Marland Kitchen warfarin (COUMADIN) 5 MG tablet Take 2.5-5 mg by mouth daily. 2.5mg  daily except on Wed and Sat take 5mg      No current facility-administered medications for this encounter.    No Known Allergies  History   Social History  . Marital Status: Married    Spouse Name: N/A  . Number of Children: N/A  . Years of  Education: N/A   Occupational History  . Truck Geophysicist/field seismologist     Retired    Social History Main Topics  . Smoking status: Never Smoker   . Smokeless tobacco: Never Used  . Alcohol Use: No  . Drug Use: No  . Sexual Activity: Not Currently   Other Topics Concern  . Not on file   Social History Narrative    Family History  Problem Relation Age of Onset  . Heart failure Brother     Deceased  . Stroke Mother     Deceased  . Heart disease Father     Deceased  . Stroke Brother     ROS- All systems are reviewed and negative except as per the HPI above  Physical Exam: Filed Vitals:   10/07/14 1402  BP: 132/80  Pulse: 72  Height: 5\' 8"  (1.727 m)  Weight: 196 lb 12.8 oz (89.268 kg)    GEN- The patient is well appearing, alert and oriented x 3 today.   Head- normocephalic, atraumatic Eyes-  Sclera clear, conjunctiva pink Ears- hearing intact Oropharynx- clear Neck- supple, no JVP Lymph- no cervical lymphadenopathy Lungs- Clear to ausculation bilaterally, normal work of breathing Heart- Regular rate and rhythm, with [id number] systolic murmur , rubs or gallops, PMI not laterally displaced GI- soft, NT, ND, + BS Extremities- no clubbing, cyanosis, or edema MS- no significant deformity or atrophy Skin- no rash or lesion Psych- euthymic mood, full affect Neuro- strength and sensation are intact  EKG- SR at 72 bpm, Pr int 208 ms, QRS duration 124 ms QTc 481 ms. Epic records reviewed  Assessment and Plan:  1. PAF Back in SR. Wife and pt feel that flomax may have been a trigger. Stay off drug. No benefit to urination when pt on flomax. Continue amiodarone. Continue warfarin, followed at the [place]. No change in therapy for afib  2. CAD Stable  3. Chronic diastolic heart failue Stable  4. HTN Stable    F/u with Dr. Radford Pax as scheduled 8/29, afib clinic as needed.

## 2014-10-07 NOTE — Telephone Encounter (Signed)
Patient was seen in [facility] today.

## 2014-10-07 NOTE — Telephone Encounter (Signed)
Patient noted to be in afib at cardiac rehab on 10/05/2014. He was asymptomatic and HR was 75 bpm..  Please have patient come in for EKG with the nurs

## 2014-10-08 ENCOUNTER — Encounter (HOSPITAL_COMMUNITY)
Admission: RE | Admit: 2014-10-08 | Discharge: 2014-10-08 | Disposition: A | Discharge status: Home / Self Care | Payer: Medicare Other | Source: Ambulatory Visit | Attending: Cardiology | Admitting: Cardiology

## 2014-10-08 DIAGNOSIS — I509 Heart failure, unspecified: Secondary | ICD-10-CM | POA: Diagnosis not present

## 2014-10-11 ENCOUNTER — Encounter (HOSPITAL_COMMUNITY)
Admission: RE | Admit: 2014-10-11 | Discharge: 2014-10-11 | Disposition: A | Discharge status: Home / Self Care | Payer: Medicare Other | Source: Ambulatory Visit | Attending: Cardiology | Admitting: Cardiology

## 2014-10-11 DIAGNOSIS — I509 Heart failure, unspecified: Secondary | ICD-10-CM | POA: Diagnosis not present

## 2014-10-13 ENCOUNTER — Encounter (HOSPITAL_COMMUNITY)
Admission: RE | Admit: 2014-10-13 | Discharge: 2014-10-13 | Disposition: A | Discharge status: Home / Self Care | Payer: Medicare Other | Source: Ambulatory Visit | Attending: Cardiology | Admitting: Cardiology

## 2014-10-13 NOTE — Progress Notes (Signed)
[name] will not exercise today. [name]'s wife called to report that [name] has a kidney infection he hopes to return to exercise on Friday.Pt graduates from cardiac rehab program on Friday with completion of 36 exercise sessions in Phase II. Pt maintained good attendance and progressed nicely during his participation in rehab as evidenced by increased MET level.   Medication list reconciled. Repeat  PHQ score- 0 .  Pt has made some lifestyle changes and should be commended for his success. Pt feels he has achieved his goals during cardiac rehab.   Pt plans to continue exercise at home as he is able.

## 2014-10-15 ENCOUNTER — Encounter (HOSPITAL_COMMUNITY)
Admission: RE | Admit: 2014-10-15 | Discharge: 2014-10-15 | Disposition: A | Discharge status: Home / Self Care | Payer: Medicare Other | Source: Ambulatory Visit | Attending: Cardiology | Admitting: Cardiology

## 2014-10-15 DIAGNOSIS — I509 Heart failure, unspecified: Secondary | ICD-10-CM | POA: Diagnosis not present

## 2014-10-15 NOTE — Progress Notes (Signed)
Pt arrived at cardiac rehab today reporting addition of antibiotic to treat kidney infection. Pt and wife unsure of medication name. Pt reports relief of back pain and s/s of infection. Pt started antibiotic Tuesday evening. However pt does report nausea and vomiting last episode yesterday morning  from tramadol which he was given for back pain. Pt states he notified prescribing physician and has stopped the tramadol.  Pt asymptomatic today and has eaten accordingly.  Pt able to tolerate low level exercise without difficulty.

## 2014-10-18 ENCOUNTER — Encounter (HOSPITAL_COMMUNITY): Payer: Medicare Other

## 2014-10-20 ENCOUNTER — Encounter (HOSPITAL_COMMUNITY): Payer: Medicare Other

## 2014-10-22 ENCOUNTER — Encounter (HOSPITAL_COMMUNITY): Payer: Medicare Other

## 2014-11-15 ENCOUNTER — Ambulatory Visit (INDEPENDENT_AMBULATORY_CARE_PROVIDER_SITE_OTHER): Payer: Medicare Other | Admitting: Cardiology

## 2014-11-15 ENCOUNTER — Encounter: Payer: Self-pay | Admitting: Cardiology

## 2014-11-15 VITALS — BP 138/60 | HR 66 | Ht 68.0 in | Wt 193.4 lb

## 2014-11-15 DIAGNOSIS — I5032 Chronic diastolic (congestive) heart failure: Secondary | ICD-10-CM

## 2014-11-15 DIAGNOSIS — I10 Essential (primary) hypertension: Secondary | ICD-10-CM | POA: Diagnosis not present

## 2014-11-15 DIAGNOSIS — I480 Paroxysmal atrial fibrillation: Secondary | ICD-10-CM

## 2014-11-15 DIAGNOSIS — R0989 Other specified symptoms and signs involving the circulatory and respiratory systems: Secondary | ICD-10-CM

## 2014-11-15 DIAGNOSIS — I2510 Atherosclerotic heart disease of native coronary artery without angina pectoris: Secondary | ICD-10-CM

## 2014-11-15 DIAGNOSIS — Z7901 Long term (current) use of anticoagulants: Secondary | ICD-10-CM

## 2014-11-15 MED ORDER — AMIODARONE HCL 200 MG PO TABS: 200.0000 mg | ORAL_TABLET | Freq: Every day | ORAL | Status: DC

## 2014-11-15 NOTE — Patient Instructions (Signed)
Medication Instructions:  Your physician recommends that you continue on your current medications as directed. Please refer to the Current Medication list given to you today.   Labwork: None  Testing/Procedures: Your physician has requested that you have a carotid duplex. This test is an ultrasound of the carotid arteries in your neck. It looks at blood flow through these arteries that supply the brain with blood. Allow one hour for this exam. There are no restrictions or special instructions.  Follow-Up: Your physician wants you to follow-up in: 6 months with Dr. Radford Pax. You will receive a reminder letter in the mail two months in advance. If you don't receive a letter, please call our office to schedule the follow-up appointment.   Any Other Special Instructions Will Be Listed Below (If Applicable).

## 2014-11-15 NOTE — Progress Notes (Signed)
Cardiology Office Note   Date:  11/15/2014   ID:  Aztlan Billips, DOB April 13, 1931, MRN [id number]  PCP:  [name], MD    Chief Complaint  Patient presents with  . Coronary Artery Disease      History of Present Illness: [name] is a 81 y.o. male with a history of ASCAD with single vessel CABF, MV annuloplasty, TIA, PAF on coumadin, HTN, and dyslipidemia who presents today for followup. His CHADS2VASC score is 6 and he is on anticoagulation. He denies any SOB, DOE, chest pain, LE edema. He denies any palpitations, dizziness or syncope.    Past Medical History  Diagnosis Date  . PAF (paroxysmal atrial fibrillation)     a. CHA2DS2VASc = 6 -->chronic coumadin;  b. Prev on amio-->d/c 2/2 hypothyroidism. >> resumed 5/16  . Diverticulosis     Colonoscopy  . TIA (transient ischemic attack)   . Hyperlipidemia   . Hypertension   . Hemorrhoids   . Adenomatous colon polyp   . H/O mitral valve repair   . Chronic diastolic heart failure     a. 01/2014 Echo: EF 50-55%, no rwma, mild AS, mild MR, mildly dil LA. PASP 32 mmHg.  . Essential hypertension   . Dyslipidemia   . Current use of long term anticoagulation     a. Coumadin in setting of afib.  Marland Kitchen Hemorrhoids   . CAD (coronary artery disease)     a. S/P CABG x 1 @ time of MV annuloplasty;  b. 02/2010 ETT: neg for ischemia.    Past Surgical History  Procedure Laterality Date  . Coronary artery bypass graft    . Mitral valve repair       Current Outpatient Prescriptions  Medication Sig Dispense Refill  . amiodarone (PACERONE) 200 MG tablet Take 1 tablet (200 mg total) by mouth daily. 30 tablet 6  . Cholecalciferol (VITAMIN D-3 PO) Take 2,000 Units by mouth daily.    . ciprofloxacin (CIPRO) 250 MG tablet Take 250 mg by mouth 2 (two) times daily. For 10 days for UTI    . diltiazem (CARDIZEM CD) 120 MG 24 hr capsule Take 1 capsule (120 mg total) by mouth daily. Hold if systolic blood pressure  (the top number) is less than 100. 30 capsule 3  . docusate sodium (COLACE) 100 MG capsule Take 100 mg by mouth 2 (two) times daily.    Marland Kitchen levothyroxine (SYNTHROID, LEVOTHROID) 50 MCG tablet Take 50 mcg by mouth daily before breakfast.    . NATURAL PSYLLIUM FIBER PO Take by mouth as directed.    . pravastatin (PRAVACHOL) 40 MG tablet Take 40 mg by mouth daily.    Marland Kitchen warfarin (COUMADIN) 5 MG tablet Take 2.5-5 mg by mouth daily. 2.5mg  daily except on Wed and Sat take 5mg      No current facility-administered medications for this visit.    Allergies:   Asa and Flomax    Social History:  The patient  reports that he has never smoked. He has never used smokeless tobacco. He reports that he does not drink alcohol or use illicit drugs.   Family History:  The patient's family history includes Heart disease in his father; Heart failure in his brother; Stroke in his brother and mother.    ROS:  Please see the history of present illness.   Otherwise, review of systems are positive for none.   All  other systems are reviewed and negative.    PHYSICAL EXAM: VS:  BP 138/60 mmHg  Pulse 66  Ht 5\' 8"  (1.727 m)  Wt 193 lb 6.4 oz (87.726 kg)  BMI 29.41 kg/m2 , BMI Body mass index is 29.41 kg/(m^2). GEN: Well nourished, well developed, in no acute distress HEENT: normal Neck: no JVD or masses.  Right carotid artery bruit Cardiac: RRR; no rubs, or gallops,no edema.  2/6 SM at RUSB to LLSB and right carotid Respiratory:  clear to auscultation bilaterally, normal work of breathing GI: soft, nontender, nondistended, + BS MS: no deformity or atrophy Skin: warm and dry, no rash Neuro:  Strength and sensation are intact Psych: euthymic mood, full affect   EKG:  EKG is not ordered today.   Recent Labs: 02/06/2014: Pro B Natriuretic peptide (BNP) 198.9 09/09/2014: ALT 19; BUN 34*; Creatinine, Ser 1.81*; Hemoglobin 15.5; Platelets 149.0*; Potassium 4.1; Sodium 139; TSH 2.21    Lipid Panel    Component  Value Date/Time   CHOL 154 11/24/2010 0258   TRIG 50 11/24/2010 0258   HDL 41 11/24/2010 0258   CHOLHDL 3.8 11/24/2010 0258   VLDL 10 11/24/2010 0258   LDLCALC 103* 11/24/2010 0258      Wt Readings from Last 3 Encounters:  11/15/14 193 lb 6.4 oz (87.726 kg)  10/07/14 196 lb 12.8 oz (89.268 kg)  09/09/14 196 lb (88.905 kg)    ASSESSMENT AND PLAN:  1. Chronic diastolic CHF - appears euvolemic 2. MR s/p remote MV annuloplasty 3. ASCAD with 1 vessel CABG with no angina. Not on ASA due to warfarin. 4. HTN -  controlled. Continue Cardizem 5. Dyslipidemia - continue statin. I will get a copy of his recent lipids   6. PAF maintaining NSR. Continue Cardizem/warafin. His Amio was stopped due to hypothyroidism    7. CKD stage III - per PCP    8.   Mild AS - repeat echo in 1 year   9.   Right carotid bruit - this may be transmission of murmur from AS - will check carotid dopplers to assess    Current medicines are reviewed at length with the patient today.  The patient does not have concerns regarding medicines.  The following changes have been made:  no change  Labs/ tests ordered today: See above Assessment and Plan No orders of the defined types were placed in this encounter.     Disposition:   FU with me in 6 months  Signed, Sueanne Margarita, MD  11/15/2014 8:12 AM    [facility] [place], [place],   [address] Phone: [phone]; Fax: [phone]

## 2014-11-19 ENCOUNTER — Ambulatory Visit (HOSPITAL_COMMUNITY)
Admission: RE | Admit: 2014-11-19 | Discharge: 2014-11-19 | Disposition: A | Discharge status: Home / Self Care | Payer: Medicare Other | Source: Ambulatory Visit | Attending: Cardiology | Admitting: Cardiology

## 2014-11-19 DIAGNOSIS — I2510 Atherosclerotic heart disease of native coronary artery without angina pectoris: Secondary | ICD-10-CM | POA: Insufficient documentation

## 2014-11-19 DIAGNOSIS — I6523 Occlusion and stenosis of bilateral carotid arteries: Secondary | ICD-10-CM | POA: Insufficient documentation

## 2014-11-19 DIAGNOSIS — R0989 Other specified symptoms and signs involving the circulatory and respiratory systems: Secondary | ICD-10-CM | POA: Diagnosis not present

## 2014-11-19 DIAGNOSIS — E785 Hyperlipidemia, unspecified: Secondary | ICD-10-CM | POA: Diagnosis not present

## 2014-11-19 DIAGNOSIS — I10 Essential (primary) hypertension: Secondary | ICD-10-CM | POA: Insufficient documentation

## 2014-11-19 DIAGNOSIS — Z951 Presence of aortocoronary bypass graft: Secondary | ICD-10-CM | POA: Insufficient documentation

## 2014-11-22 ENCOUNTER — Encounter: Payer: Self-pay | Admitting: Cardiology

## 2014-11-22 DIAGNOSIS — I6529 Occlusion and stenosis of unspecified carotid artery: Secondary | ICD-10-CM | POA: Insufficient documentation

## 2014-11-24 ENCOUNTER — Telehealth: Payer: Self-pay

## 2014-11-24 DIAGNOSIS — I6529 Occlusion and stenosis of unspecified carotid artery: Secondary | ICD-10-CM

## 2014-11-24 NOTE — Telephone Encounter (Signed)
-----   Message from Sueanne Margarita, MD sent at 11/22/2014  9:58 AM EDT ----- Carotid dopplers showed 1-39% carotid stenosis - repeat study in 2 years

## 2014-11-24 NOTE — Telephone Encounter (Signed)
Carotid dopplers ordered for scheduling in 2 years. Message sent to [facility] for scheduling.

## 2014-11-29 ENCOUNTER — Ambulatory Visit: Payer: Medicare Other | Admitting: Cardiology

## 2014-12-09 ENCOUNTER — Other Ambulatory Visit: Payer: Self-pay | Admitting: Cardiology

## 2014-12-09 MED ORDER — DILTIAZEM HCL ER COATED BEADS 120 MG PO CP24: 120.0000 mg | ORAL_CAPSULE | Freq: Every day | ORAL | Status: DC

## 2015-02-18 ENCOUNTER — Encounter: Payer: Self-pay | Admitting: Cardiology

## 2015-06-02 ENCOUNTER — Ambulatory Visit: Payer: Medicare Other | Admitting: Cardiology

## 2015-06-15 ENCOUNTER — Other Ambulatory Visit: Payer: Self-pay | Admitting: Cardiovascular Disease

## 2015-06-16 ENCOUNTER — Other Ambulatory Visit: Payer: Self-pay | Admitting: *Deleted

## 2015-06-16 MED ORDER — DILTIAZEM HCL ER COATED BEADS 120 MG PO CP24: 120.0000 mg | ORAL_CAPSULE | Freq: Every day | ORAL | Status: DC

## 2015-07-08 ENCOUNTER — Ambulatory Visit (INDEPENDENT_AMBULATORY_CARE_PROVIDER_SITE_OTHER): Payer: Medicare Other

## 2015-07-08 ENCOUNTER — Ambulatory Visit (INDEPENDENT_AMBULATORY_CARE_PROVIDER_SITE_OTHER): Payer: Medicare Other | Admitting: Cardiology

## 2015-07-08 ENCOUNTER — Encounter: Payer: Self-pay | Admitting: Cardiology

## 2015-07-08 VITALS — BP 118/64 | HR 76 | Ht 68.0 in | Wt 192.1 lb

## 2015-07-08 DIAGNOSIS — I480 Paroxysmal atrial fibrillation: Secondary | ICD-10-CM

## 2015-07-08 DIAGNOSIS — I5032 Chronic diastolic (congestive) heart failure: Secondary | ICD-10-CM

## 2015-07-08 DIAGNOSIS — I2510 Atherosclerotic heart disease of native coronary artery without angina pectoris: Secondary | ICD-10-CM | POA: Diagnosis not present

## 2015-07-08 DIAGNOSIS — Z7901 Long term (current) use of anticoagulants: Secondary | ICD-10-CM | POA: Diagnosis not present

## 2015-07-08 DIAGNOSIS — I10 Essential (primary) hypertension: Secondary | ICD-10-CM

## 2015-07-08 DIAGNOSIS — E785 Hyperlipidemia, unspecified: Secondary | ICD-10-CM

## 2015-07-08 DIAGNOSIS — I6529 Occlusion and stenosis of unspecified carotid artery: Secondary | ICD-10-CM

## 2015-07-08 DIAGNOSIS — I350 Nonrheumatic aortic (valve) stenosis: Secondary | ICD-10-CM

## 2015-07-08 DIAGNOSIS — Z9889 Other specified postprocedural states: Secondary | ICD-10-CM

## 2015-07-08 LAB — POCT INR: INR: 2.2

## 2015-07-08 NOTE — Patient Instructions (Addendum)
Medication Instructions:  Your physician recommends that you continue on your current medications as directed. Please refer to the Current Medication list given to you today.   Labwork: Your physician recommends that you return for FASTING lab work at the time of your next Coumadin appointment next week.   Testing/Procedures: Your physician has requested that you have an echocardiogram. Echocardiography is a painless test that uses sound waves to create images of your heart. It provides your doctor with information about the size and shape of your heart and how well your heart's chambers and valves are working. This procedure takes approximately one hour. There are no restrictions for this procedure.  Your physician has recommended that you have a pulmonary function test. Pulmonary Function Tests are a group of tests that measure how well air moves in and out of your lungs.  Follow-Up: Your physician recommends that you schedule a follow-up appointment on Friday, May 19 with [name], PA.    Any Other Special Instructions Will Be Listed Below (If Applicable).     If you need a refill on your cardiac medications before your next appointment, please call your pharmacy.

## 2015-07-08 NOTE — Progress Notes (Signed)
Cardiology Office Note    Date:  07/08/2015   ID:  [name], DOB 10/21/1931, MRN [id number]  PCP:  Micheal Frees, MD  Cardiologist:  Micheal Margarita, MD   Chief Complaint  Patient presents with  . Coronary Artery Disease  . Hypertension  . Congestive Heart Failure    History of Present Illness:  [name] is a 82 y.o. male with a history of ASCAD with single vessel CABF, MV annuloplasty, TIA, PAF on coumadin, HTN, and dyslipidemia who presents today for followup. His CHADS2VASC score is 6 and he is on anticoagulation. He denies any SOB, DOE, chest pain, LE edema. He denies any  dizziness or syncope.  He occasionally notices an irregular heart rhythm at night and in the early am.     Past Medical History  Diagnosis Date  . PAF (paroxysmal atrial fibrillation) (HCC)     a. CHA2DS2VASc = 6 -->chronic coumadin;  b. Prev on amio-->d/c 2/2 hypothyroidism. >> resumed 5/16  . Diverticulosis     Colonoscopy  . TIA (transient ischemic attack)   . Hyperlipidemia   . Hypertension   . Hemorrhoids   . Adenomatous colon polyp   . H/O mitral valve repair   . Chronic diastolic heart failure ([place])     a. 01/2014 Echo: EF 50-55%, no rwma, mild AS, mild MR, mildly dil LA. PASP 32 mmHg.  . Essential hypertension   . Dyslipidemia   . Current use of long term anticoagulation     a. Coumadin in setting of afib.  Marland Kitchen Hemorrhoids   . CAD (coronary artery disease)     a. S/P CABG x 1 @ time of MV annuloplasty;  b. 02/2010 ETT: neg for ischemia.  . Aortic stenosis 07/08/2015    Past Surgical History  Procedure Laterality Date  . Coronary artery bypass graft    . Mitral valve repair      Current Medications: Outpatient Prescriptions Prior to Visit  Medication Sig Dispense Refill  . Cholecalciferol (VITAMIN D-3 PO) Take 2,000 Units by mouth daily.    Marland Kitchen diltiazem (CARDIZEM CD) 120 MG 24 hr capsule Take 1 capsule (120 mg total) by mouth daily. Hold if systolic blood pressure  (the top number) is less than 100. 30 capsule 3  . docusate sodium (COLACE) 100 MG capsule Take 100 mg by mouth 2 (two) times daily.    Marland Kitchen levothyroxine (SYNTHROID, LEVOTHROID) 50 MCG tablet Take 50 mcg by mouth daily before breakfast.    . NATURAL PSYLLIUM FIBER PO Take 1 Dose by mouth daily as needed (constipation).     . pravastatin (PRAVACHOL) 40 MG tablet Take 40 mg by mouth daily.    Marland Kitchen warfarin (COUMADIN) 5 MG tablet Take 2.5-5 mg by mouth daily. 2.5mg  daily except on Wed and Sat take 5mg     . amiodarone (PACERONE) 200 MG tablet Take 1 tablet (200 mg total) by mouth daily. 90 tablet 3  . ciprofloxacin (CIPRO) 250 MG tablet Take 250 mg by mouth 2 (two) times daily. For 10 days for UTI     No facility-administered medications prior to visit.     Allergies:   Asa and Flomax   Social History   Social History  . Marital Status: Married    Spouse Name: N/A  . Number of Children: N/A  . Years of Education: N/A   Occupational History  . Truck Geophysicist/field seismologist     Retired    Social History Main Topics  . Smoking status:  Never Smoker   . Smokeless tobacco: Never Used  . Alcohol Use: No  . Drug Use: No  . Sexual Activity: Not Currently   Other Topics Concern  . None   Social History Narrative     Family History:  The patient's family history includes Heart disease in his father; Heart failure in his brother; Stroke in his brother and mother.   ROS:   Please see the history of present illness.    Review of Systems  Constitution: Negative.  HENT: Negative.   Eyes: Negative.   Cardiovascular: Negative.   Respiratory: Negative.   Skin: Negative.   Musculoskeletal: Negative.   Gastrointestinal: Negative.   Genitourinary: Negative.   Neurological: Negative.   Psychiatric/Behavioral: Negative.    All other systems reviewed and are negative.   PHYSICAL EXAM:   VS:  BP 118/64 mmHg  Pulse 76  Ht 5\' 8"  (1.727 m)  Wt 192 lb 1.9 oz (87.145 kg)  BMI 29.22 kg/m2   GEN: Well  nourished, well developed, in no acute distress HEENT: normal Neck: no JVD, carotid bruits, or masses Cardiac: irregularly irergular; no rubs, or gallops,no edema.  Intact distal pulses bilaterally. 2/6 Sm at RUSB to LLSB Respiratory:  clear to auscultation bilaterally, normal work of breathing GI: soft, nontender, nondistended, + BS MS: no deformity or atrophy Skin: warm and dry, no rash Neuro:  Alert and Oriented x 3, Strength and sensation are intact Psych: euthymic mood, full affect  Wt Readings from Last 3 Encounters:  07/08/15 192 lb 1.9 oz (87.145 kg)  11/15/14 193 lb 6.4 oz (87.726 kg)  10/07/14 196 lb 12.8 oz (89.268 kg)      Studies/Labs Reviewed:   EKG:  EKG is  ordered today and showed atrial fibrillation with CVR and PVC  Recent Labs: 09/09/2014: ALT 19; BUN 34*; Creatinine, Ser 1.81*; Hemoglobin 15.5; Platelets 149.0*; Potassium 4.1; Sodium 139; TSH 2.21   Lipid Panel    Component Value Date/Time   CHOL 154 11/24/2010 0258   TRIG 50 11/24/2010 0258   HDL 41 11/24/2010 0258   CHOLHDL 3.8 11/24/2010 0258   VLDL 10 11/24/2010 0258   LDLCALC 103* 11/24/2010 0258    Additional studies/ records that were reviewed today include:  none    ASSESSMENT:    1. Coronary artery disease involving native coronary artery of native heart without angina pectoris   2. PAF (paroxysmal atrial fibrillation) ([place])   3. Essential hypertension   4. Chronic diastolic heart failure (HCC)   5. Carotid artery stenosis, unspecified laterality   6. Dyslipidemia   7. H/O mitral valve repair   8. Aortic stenosis      PLAN:  In order of problems listed above:  1. ASCAD s/p remote CABG with no anginal symptoms.  Not on ASA due to warfarin.  Continue statin.   2. PAF now back in atrial fibrillation. He stopped his Amio a few months ago because his PCP told him it could hurt his lungs.   Continue CCB/warfarin. Check TSH/LFTs/PFTs with DLCO.  He says that his INR dropped below 2 a few  weeks ago so I will check his INR in coumadin clinic today and then he will come to our coumadin clinic weekly until INR therapeutic for 4 weeks.  Then we will restart Amio and 3 weeks later set up for DCCV if he is still in afib.   3. HTN - BP controlled.  Continue CCB. 4. Chronic diastolic CHF - he appears euvolemic on  exam today.  Continue CCB. 5. Carotid artery stenosis 1-39% bilateral - continue statin.  No ASA due to warfarin. Repeat dopplers 11/2016 6. Dyslipidemia - LDL goal is < 70.  Continue Pravachol.  Check FLP and ALT 7. H/O MV repair 8. Mild AS - by echo 2015.  Repeat echo to assess for progression.     Medication Adjustments/Labs and Tests Ordered: Current medicines are reviewed at length with the patient today.  Concerns regarding medicines are outlined above.  Medication changes, Labs and Tests ordered today are listed in the Patient Instructions below. There are no Patient Instructions on file for this visit.   Lurena Nida, MD  07/08/2015 9:08 AM    [facility] [place], [place], [place]  [address] Phone: [phone]; Fax: [phone]

## 2015-07-08 NOTE — Addendum Note (Signed)
Addended by: Brynda Peon on: 07/08/2015 01:33 PM   Modules accepted: Orders, Medications

## 2015-07-15 ENCOUNTER — Ambulatory Visit (INDEPENDENT_AMBULATORY_CARE_PROVIDER_SITE_OTHER): Payer: Medicare Other | Admitting: *Deleted

## 2015-07-15 ENCOUNTER — Other Ambulatory Visit: Payer: Medicare Other | Admitting: *Deleted

## 2015-07-15 DIAGNOSIS — I480 Paroxysmal atrial fibrillation: Secondary | ICD-10-CM | POA: Diagnosis not present

## 2015-07-15 DIAGNOSIS — Z7901 Long term (current) use of anticoagulants: Secondary | ICD-10-CM | POA: Diagnosis not present

## 2015-07-15 DIAGNOSIS — E785 Hyperlipidemia, unspecified: Secondary | ICD-10-CM

## 2015-07-15 LAB — LIPID PANEL
Cholesterol: 161 mg/dL (ref 125–200)
HDL: 43 mg/dL (ref >=40)
LDL CALC: 96 mg/dL (ref <130)
Total CHOL/HDL Ratio: 3.7 Ratio (ref <=5.0)
Triglycerides: 111 mg/dL (ref <150)
VLDL: 22 mg/dL (ref <30)

## 2015-07-15 LAB — POCT INR: INR: 2.2

## 2015-07-15 LAB — HEPATIC FUNCTION PANEL
ALBUMIN: 4.3 g/dL (ref 3.6–5.1)
ALT: 17 U/L (ref 9–46)
AST: 18 U/L (ref 10–35)
Alkaline Phosphatase: 54 U/L (ref 40–115)
BILIRUBIN INDIRECT: 0.6 mg/dL (ref 0.2–1.2)
Bilirubin, Direct: 0.2 mg/dL (ref <=0.2)
Total Bilirubin: 0.8 mg/dL (ref 0.2–1.2)
Total Protein: 6.5 g/dL (ref 6.1–8.1)

## 2015-07-15 LAB — BASIC METABOLIC PANEL
BUN: 26 mg/dL — ABNORMAL HIGH (ref 7–25)
CO2: 25 mmol/L (ref 20–31)
Calcium: 8.9 mg/dL (ref 8.6–10.3)
Chloride: 104 mmol/L (ref 98–110)
Creat: 1.58 mg/dL — ABNORMAL HIGH (ref 0.70–1.11)
Glucose, Bld: 94 mg/dL (ref 65–99)
POTASSIUM: 4.6 mmol/L (ref 3.5–5.3)
SODIUM: 139 mmol/L (ref 135–146)

## 2015-07-15 LAB — TSH: TSH: 2.77 m[IU]/L (ref 0.40–4.50)

## 2015-07-15 NOTE — Addendum Note (Signed)
Addended by: Eulis Foster on: 07/15/2015 08:57 AM   Modules accepted: Orders

## 2015-07-18 ENCOUNTER — Telehealth: Payer: Self-pay | Admitting: Cardiology

## 2015-07-18 DIAGNOSIS — E785 Hyperlipidemia, unspecified: Secondary | ICD-10-CM

## 2015-07-18 MED ORDER — PRAVASTATIN SODIUM 80 MG PO TABS: 80.0000 mg | ORAL_TABLET | Freq: Every day | ORAL | Status: DC

## 2015-07-18 NOTE — Telephone Encounter (Signed)
Reminded patient his fasting lab work was already drawn and informed him of results..  Instructed patient to INCREASE PRAVASTATIN to 80 mg daily. Prescription for medication placed at check-in for patient pick-up on Thursday. Follow-up fasting lab appointment will be coordinated with Coumadin appointment in a few weeks. Patient agrees with treatment plan.

## 2015-07-18 NOTE — Telephone Encounter (Signed)
At pt's last visit was told to have fasting lab at next coumadin check-May 4th but appt is at 245 and has echo at 3p, so pt is going to come in earlier that day then come back-can you put order in?

## 2015-07-18 NOTE — Telephone Encounter (Signed)
-----   Message from Sueanne Margarita, MD sent at 07/16/2015  9:30 PM EDT ----- LDL not at goal - increase pravastatin to 80mg  daily and repeat FLP and ALT in 6 weeks

## 2015-07-21 ENCOUNTER — Ambulatory Visit (HOSPITAL_COMMUNITY): Payer: Medicare Other | Attending: Cardiology

## 2015-07-21 ENCOUNTER — Other Ambulatory Visit: Payer: Self-pay

## 2015-07-21 ENCOUNTER — Ambulatory Visit (INDEPENDENT_AMBULATORY_CARE_PROVIDER_SITE_OTHER): Payer: Medicare Other | Admitting: *Deleted

## 2015-07-21 DIAGNOSIS — I359 Nonrheumatic aortic valve disorder, unspecified: Secondary | ICD-10-CM | POA: Diagnosis present

## 2015-07-21 DIAGNOSIS — I340 Nonrheumatic mitral (valve) insufficiency: Secondary | ICD-10-CM | POA: Diagnosis not present

## 2015-07-21 DIAGNOSIS — I480 Paroxysmal atrial fibrillation: Secondary | ICD-10-CM

## 2015-07-21 DIAGNOSIS — Z7901 Long term (current) use of anticoagulants: Secondary | ICD-10-CM

## 2015-07-21 DIAGNOSIS — I350 Nonrheumatic aortic (valve) stenosis: Secondary | ICD-10-CM | POA: Diagnosis not present

## 2015-07-21 DIAGNOSIS — Z951 Presence of aortocoronary bypass graft: Secondary | ICD-10-CM | POA: Insufficient documentation

## 2015-07-21 DIAGNOSIS — I119 Hypertensive heart disease without heart failure: Secondary | ICD-10-CM | POA: Insufficient documentation

## 2015-07-21 DIAGNOSIS — E785 Hyperlipidemia, unspecified: Secondary | ICD-10-CM | POA: Insufficient documentation

## 2015-07-21 LAB — POCT INR: INR: 2.3

## 2015-07-29 ENCOUNTER — Ambulatory Visit (INDEPENDENT_AMBULATORY_CARE_PROVIDER_SITE_OTHER): Payer: Medicare Other | Admitting: Surgery

## 2015-07-29 DIAGNOSIS — I480 Paroxysmal atrial fibrillation: Secondary | ICD-10-CM | POA: Diagnosis not present

## 2015-07-29 DIAGNOSIS — Z7901 Long term (current) use of anticoagulants: Secondary | ICD-10-CM | POA: Diagnosis not present

## 2015-07-29 LAB — POCT INR: INR: 2.4

## 2015-08-02 ENCOUNTER — Ambulatory Visit (INDEPENDENT_AMBULATORY_CARE_PROVIDER_SITE_OTHER): Payer: Medicare Other | Admitting: Gastroenterology

## 2015-08-02 ENCOUNTER — Encounter: Payer: Self-pay | Admitting: Gastroenterology

## 2015-08-02 VITALS — BP 100/56 | HR 76 | Ht 68.0 in | Wt 192.4 lb

## 2015-08-02 DIAGNOSIS — K625 Hemorrhage of anus and rectum: Secondary | ICD-10-CM

## 2015-08-02 DIAGNOSIS — K648 Other hemorrhoids: Secondary | ICD-10-CM

## 2015-08-02 DIAGNOSIS — K644 Residual hemorrhoidal skin tags: Secondary | ICD-10-CM

## 2015-08-02 MED ORDER — HYDROCORTISONE 2.5 % RE CREA: 1.0000 "application " | TOPICAL_CREAM | Freq: Two times a day (BID) | RECTAL | Status: DC

## 2015-08-02 NOTE — Progress Notes (Signed)
Agree with assessment and plan. For external hemorrhoid bleeding this would need to be managed by surgery. If we were to consider banding for internal hemorrhoids, he would have to be off coumadin and not require a bridge.

## 2015-08-02 NOTE — Progress Notes (Signed)
     08/02/2015 [name] [id number] 08/20/1931   History of Present Illness:  This is a pleasant 82 year old male who is previously known to Dr. Olevia Perches.  His last colonoscopy here was in 06/2011 at which time he was found to have one polyp that was removed and was a tubular adenoma (with melanosis coli on pathology), diverticulosis, and melanosis coli throughout the colon.  However, we also found out that he had a colonoscopy at the [place] in May 2015 that showed diverticulosis and one sessile polyp for which the pathology was not available. I saw him in January 2016 for complaints of rectal bleeding and hemorrhoids.  He saw Dr. Rosendo Gros with [facility] back in October 2015 at which time he had an internal hemorrhoid banding at the 6:00 position.  When I saw him last we determined that due to him being on Coumadin for chronic anticoagulation for his atrial fibrillation that he would not be a great candidate for in office [place] hemorrhoid banding.  He returns to our office again today for complaints of rectal bleeding and hemorrhoids. He has been seeing intermittent bright red blood again. He did not recall his previous visit and our conversations regarding our previous recommendations.   Current Medications, Allergies, Past Medical History, Past Surgical History, Family History and Social History were reviewed in [employer] record.   Physical Exam: BP 100/56 mmHg  Pulse 76  Ht 5\' 8"  (1.727 m)  Wt 192 lb 6.4 oz (87.272 kg)  BMI 29.26 kg/m2 General: Well developed white male in no acute distress Head: Normocephalic and atraumatic Eyes:  Sclerae anicteric, conjunctiva pink  Ears: Normal auditory acuity Lungs: Clear throughout to auscultation Heart: Irregularly irregular. Abdomen: Soft, non-distended.  BS present.  Non-tender. Rectal:  Moderate sized external hemorrhoid noted with stigmata of recent bleeding.  No tenderness on exam. Musculoskeletal: Symmetrical with no gross  deformities  Extremities: No edema  Neurological: Alert oriented x 4, grossly non-focal Psychological:  Alert and cooperative. Normal mood and affect  Assessment and Recommendations: -Hemorrhoids:  Has history of internal hemorrhoids as well as external and on today's exam it appears that he external hemorrhoid is the source of his recent bleeding.  I previously discussed with my attending regarding internal hemorrhoid banding, but due to him being on Coumadin anticoagulation it was thought that he was not a good candidate for this in office banding. External hemorrhoids needed to be handled by surgical method, therefore, he should return to Dr. Rosendo Gros if he desires to have this addressed.  Will give hydrocortisone cream 2.5% BID for now.  Rectal care instructions reviewed again.

## 2015-08-02 NOTE — Patient Instructions (Signed)
We sent a prescription to  [facility]. 1. Hydrocortisone cream 2.5 %

## 2015-08-04 NOTE — Progress Notes (Signed)
Cardiology Office Note:    Date:  08/05/2015   ID:  [name], DOB 04/12/1931, MRN [id number]  PCP:  [name], MD  PCP: [name], MD Cardiologist: Dr. Fransico Him  Referring MD: [name], MD   Chief Complaint  Patient presents with  . Atrial Fibrillation    Follow-up    History of Present Illness:     [name] is a 82 y.o. male with a hx of PAF, diastolic HF, CAD status post CABG + mitral valve repair, prior TIA, HTN, HL. He is on chronic Coumadin therapy for anticoagulation. Admitted in 01/2014 with generalized weakness. TSH was 25. He was started on Synthroid. He was noted to have pulmonary infiltrates and high res CT was neg for amiodarone lung toxicity. He was eventually taken off of Amiodarone b/c of hypothyroidism.  Coumadin is followed at the [facility]  He was ultimately placed back on amiodarone because of recurrent atrial fibrillation.   Patient was recently seen by Dr. Radford Pax on 07/08/15. Patient had stopped amiodarone several months prior after discussion with his primary care physician. Patient was back in atrial fibrillation. He apparently had a subtherapeutic INR. Plan was to get him checked in our Coumadin clinic weekly and once his INR was therapeutic for 4 weeks, start amiodarone and proceed with cardioversion 3 weeks later. He returns for follow-up.   Recent Labs  07/08/15 0937 07/15/15 0901 07/21/15 1552 07/29/15 1014  INR 2.2 2.2 2.3 2.4    Here today with his wife. He remains very active. He works out at [employer] 3 days a week. He is not aware that he is in atrial fibrillation. He denies fatigue, dyspnea. He denies chest pain. He denies orthopnea, PND or edema. Denies syncope. Denies any bleeding issues.   Past Medical History  Diagnosis Date  . PAF (paroxysmal atrial fibrillation) (HCC)     a. CHA2DS2VASc = 6 -->chronic coumadin;  b. Prev on amio-->d/c 2/2 hypothyroidism. >> resumed 5/16  .  Diverticulosis     Colonoscopy  . TIA (transient ischemic attack)   . Hyperlipidemia   . Hypertension   . Hemorrhoids   . Adenomatous colon polyp   . H/O mitral valve repair   . Chronic diastolic heart failure ([place])     a. 01/2014 Echo: EF 50-55%, no rwma, mild AS, mild MR, mildly dil LA. PASP 32 mmHg.  . Essential hypertension   . Dyslipidemia   . Current use of long term anticoagulation     a. Coumadin in setting of afib.  Marland Kitchen Hemorrhoids   . CAD (coronary artery disease)     a. S/P CABG x 1 @ time of MV annuloplasty;  b. 02/2010 ETT: neg for ischemia.  . Aortic stenosis 07/08/2015    Past Surgical History  Procedure Laterality Date  . Coronary artery bypass graft    . Mitral valve repair      Current Medications: Outpatient Prescriptions Prior to Visit  Medication Sig Dispense Refill  . Cholecalciferol (VITAMIN D-3 PO) Take 2,000 Units by mouth daily.    Marland Kitchen diltiazem (CARDIZEM CD) 120 MG 24 hr capsule Take 1 capsule (120 mg total) by mouth daily. Hold if systolic blood pressure (the top number) is less than 100. 30 capsule 3  . docusate sodium (COLACE) 100 MG capsule Take 100 mg by mouth 2 (two) times daily.    . hydrocortisone (ANUSOL-HC) 2.5 % rectal cream Place 1 application rectally 2 (two) times daily. 30 g 1  .  levothyroxine (SYNTHROID, LEVOTHROID) 50 MCG tablet Take 50 mcg by mouth daily before breakfast.    . NATURAL PSYLLIUM FIBER PO Take 1 Dose by mouth daily as needed (constipation).     . pravastatin (PRAVACHOL) 80 MG tablet Take 1 tablet (80 mg total) by mouth daily. 30 tablet 11  . warfarin (COUMADIN) 2 MG tablet Take as directed     No facility-administered medications prior to visit.      Allergies:   Asa and Flomax   Social History   Social History  . Marital Status: Married    Spouse Name: N/A  . Number of Children: N/A  . Years of Education: N/A   Occupational History  . Truck Geophysicist/field seismologist     Retired    Social History Main Topics  . Smoking status:  Never Smoker   . Smokeless tobacco: Never Used  . Alcohol Use: No  . Drug Use: No  . Sexual Activity: Not Currently   Other Topics Concern  . None   Social History Narrative     Family History:  The patient's family history includes Heart disease in his father; Heart failure in his brother; Stroke in his brother and mother.   ROS:   Please see the history of present illness.    ROS All other systems reviewed and are negative.   Physical Exam:    VS:  BP 130/58 mmHg  Pulse 84  Ht 5\' 8"  (1.727 m)  Wt 198 lb 12.8 oz (90.175 kg)  BMI 30.23 kg/m2   GEN: Well nourished, well developed, in no acute distress HEENT: normal Neck: no JVD, no masses Cardiac: Normal S1/S2, irregularly irregular rhythm;   [id number] systolic murmur RUSB,  Respiratory:  clear to auscultation bilaterally; no wheezing, rhonchi or rales GI: soft, nontender, nondistended MS: no deformity or atrophy Skin: warm and dry Neuro: No focal deficits  Psych: Alert and oriented x 3, normal affect  Wt Readings from Last 3 Encounters:  08/05/15 198 lb 12.8 oz (90.175 kg)  08/02/15 192 lb 6.4 oz (87.272 kg)  07/08/15 192 lb 1.9 oz (87.145 kg)      Studies/Labs Reviewed:     EKG:  EKG is  ordered today.  The ekg ordered today demonstrates Atrial fibrillation, HR 83  Recent Labs: 09/09/2014: Hemoglobin 15.5; Platelets 149.0* 07/15/2015: ALT 17; BUN 26*; Creat 1.58*; Potassium 4.6; Sodium 139; TSH 2.77   Recent Lipid Panel    Component Value Date/Time   CHOL 161 07/15/2015 0918   TRIG 111 07/15/2015 0918   HDL 43 07/15/2015 0918   CHOLHDL 3.7 07/15/2015 0918   VLDL 22 07/15/2015 0918   LDLCALC 96 07/15/2015 0918    Additional studies/ records that were reviewed today include:   Echo 07/21/15 Mild focal basal septal hypertrophy, EF 55-60%, normal wall motion, grade 3 diastolic dysfunction, mild aortic stenosis (mean gradient 13 mmHg), mitral valve repair intact with mild MR, moderate LAE  Carotid US  9/16 1-39% carotid stenosis - repeat study in 2 years  Holter x 48 hrs 08/26/14  Normal sinus bradycardia to sinus rhythm with average heart rate 65 bpm. Heart rate ranged from 38 to 97 bpm.  PAC's and atrial couplets  PVC's  one tracing with very bad baseline artifact and somewhat irregular and could not accurately interpret. Recommend event monitor to assess for PAF.  Echo 02/07/14 EF 50-55%, mild aortic stenosis (mean 14 mmHg), mitral valve repair okay with mild MR, mild LAE, PASP 32 mmHg  ETT 02/28/10 No  ECG changes  ASSESSMENT:     1. PAF (paroxysmal atrial fibrillation) ([place])   2. Coronary artery disease involving native coronary artery of native heart without angina pectoris   3. H/O mitral valve repair   4. Aortic stenosis   5. Essential hypertension   6. Chronic diastolic heart failure (HCC)     PLAN:     In order of problems listed above:  1. PAF - Patient recently noted to be back in atrial fibrillation. Dr. Radford Pax has had his INR checked over the last 4 weeks with a plan to start him back on amiodarone if his INR remained therapeutic. Plan is to then proceed with cardioversion after 3 weeks of amiodarone. INRs have been therapeutic since 07/08/15.  Of note, he is asymptomatic with atrial fibrillation. We discussed the options a rhythm versus rate control. Since the original plan was to try to proceed with starting him on amiodarone, we will go ahead and attempt rhythm control at this time. He is not really interested in cardioversion. He cannot into new coming here weekly for INRs due to cost.   -  Check INR today  -  Amiodarone 200 mg twice a day 1 week, then 200 mg daily  -  Check INR weekly at the [facility] 3 weeks.   -  If he does not convert with Amio, could consider rate control Rx.  2. Coronary artery disease - s/p CABG.  He is not having angina. He is currently not on aspirin as he is on Coumadin  Continue statin and calcium channel  blocker.  3. H/O mitral valve repair: Stable by recent echocardiogram with mild mitral regurgitation. Continue SBE prophylaxis.  4. Aortic stenosis: Mild by recent echocardiogram with mean gradient 13 mmHg.  5. Essential hypertension: Controlled.  6. Chronic diastolic heart failure: Volume stable. He is not currently on diuretic.   Medication Adjustments/Labs and Tests Ordered: Current medicines are reviewed at length with the patient today.  Concerns regarding medicines are outlined above.  Medication changes, Labs and Tests ordered today are outlined in the Patient Instructions noted below. Patient Instructions  Medication Instructions:  1. START AMIODARONE 200 MG TWICE DAILY FOR 1 WEEK THEN DECREASE TO 200 MG ONCE A DAY Labwork: NONE Testing/Procedures: NONE Follow-Up: SCOTT WEAVER, PAC  Any Other Special Instructions Will Be Listed Below (If Applicable). Please make sure that the [facility] checks your Coumadin every week until you come back to follow up here. Amiodarone will change the Coumadin level as well.  So, it needs to be followed closely after staring Amiodarone. If you need a refill on your cardiac medications before your next appointment, please call your pharmacy.    Signed, [name], PA-C  08/05/2015 10:02 AM    [facility] [place], [place], [place]  [address] Phone: [phone]; Fax: [phone]

## 2015-08-05 ENCOUNTER — Ambulatory Visit (INDEPENDENT_AMBULATORY_CARE_PROVIDER_SITE_OTHER): Payer: Medicare Other | Admitting: Physician Assistant

## 2015-08-05 ENCOUNTER — Encounter: Payer: Self-pay | Admitting: Physician Assistant

## 2015-08-05 VITALS — BP 130/58 | HR 84 | Ht 68.0 in | Wt 198.8 lb

## 2015-08-05 DIAGNOSIS — Z9889 Other specified postprocedural states: Secondary | ICD-10-CM | POA: Diagnosis not present

## 2015-08-05 DIAGNOSIS — I10 Essential (primary) hypertension: Secondary | ICD-10-CM

## 2015-08-05 DIAGNOSIS — I480 Paroxysmal atrial fibrillation: Secondary | ICD-10-CM | POA: Diagnosis not present

## 2015-08-05 DIAGNOSIS — I5032 Chronic diastolic (congestive) heart failure: Secondary | ICD-10-CM

## 2015-08-05 DIAGNOSIS — I350 Nonrheumatic aortic (valve) stenosis: Secondary | ICD-10-CM | POA: Diagnosis not present

## 2015-08-05 DIAGNOSIS — I2510 Atherosclerotic heart disease of native coronary artery without angina pectoris: Secondary | ICD-10-CM

## 2015-08-05 MED ORDER — AMIODARONE HCL 200 MG PO TABS: 200.0000 mg | ORAL_TABLET | ORAL | Status: DC

## 2015-08-05 NOTE — Patient Instructions (Addendum)
Medication Instructions:  1. START AMIODARONE 200 MG TWICE DAILY FOR 1 WEEK THEN DECREASE TO 200 MG ONCE A DAY Labwork: NONE Testing/Procedures: NONE Follow-Up: SCOTT WEAVER, PAC  Any Other Special Instructions Will Be Listed Below (If Applicable). Please make sure that the [facility] checks your Coumadin every week until you come back to follow up here. Amiodarone will change the Coumadin level as well.  So, it needs to be followed closely after staring Amiodarone. If you need a refill on your cardiac medications before your next appointment, please call your pharmacy.

## 2015-08-16 ENCOUNTER — Ambulatory Visit: Payer: Self-pay

## 2015-08-16 DIAGNOSIS — I480 Paroxysmal atrial fibrillation: Secondary | ICD-10-CM

## 2015-08-16 DIAGNOSIS — Z7901 Long term (current) use of anticoagulants: Secondary | ICD-10-CM

## 2015-08-26 ENCOUNTER — Encounter: Payer: Self-pay | Admitting: Physician Assistant

## 2015-08-28 NOTE — Progress Notes (Signed)
Cardiology Office Note:    Date:  08/29/2015   ID:  [name], DOB 03/31/1931, MRN [id number]  PCP:  [name], MD  Cardiologist:  Dr. Fransico Him   Electrophysiologist:  Trixie Deis [facility]:   Bayard Males, MD, [place] [address] [place], [place]  [address] Phone [phone], extension [phone] Fax [phone] Email [email or web address]   Referring MD: [name], MD   Chief Complaint  Patient presents with  . Atrial Fibrillation    follow up    History of Present Illness:     [name] is a 82 y.o. male with a hx of PAF, diastolic HF, CAD status post CABG + mitral valve repair, prior TIA, HTN, HL. He is on chronic Coumadin therapy for anticoagulation. Admitted in 01/2014 with generalized weakness. TSH was 25. He was started on Synthroid. He was noted to have pulmonary infiltrates and high res CT was neg for amiodarone lung toxicity. He was eventually taken off of Amiodarone b/c of hypothyroidism. Coumadin is followed at the [facility]. He was ultimately placed back on amiodarone because of recurrent atrial fibrillation.   Patient was recently seen by Dr. Radford Pax on 07/08/15. Patient had stopped amiodarone several months prior after discussion with his primary care physician. Patient was back in atrial fibrillation. He apparently had a subtherapeutic INR. Plan was to get him checked in our Coumadin clinic weekly and once his INR was therapeutic for 4 weeks, start amiodarone and proceed with cardioversion 3 weeks later.  I saw him 08/04/15.  I placed him on amiodarone. He was to get his INR checked weekly at the [place] in [place].   He returns for follow-up on AFib.  He is here today with his wife. He was seen at the [facility] 08/26/15. ECG demonstrated normal sinus rhythm. QTC 477 ms. He is feeling well. He notes that he was significantly fatigued previously. He now feels much better. Denies  chest pain, shortness of breath, syncope, orthopnea, PND or edema. He occasionally notes irregular heartbeat at nighttime.   Past Medical History  Diagnosis Date  . PAF (paroxysmal atrial fibrillation) (HCC)     a. CHA2DS2VASc = 6 -->chronic coumadin;  b. Prev on amio-->d/c 2/2 hypothyroidism. >> resumed 5/16  . Diverticulosis     Colonoscopy  . TIA (transient ischemic attack)   . Hyperlipidemia   . Hypertension   . Hemorrhoids   . Adenomatous colon polyp   . H/O mitral valve repair   . Chronic diastolic heart failure ([place])     a. 01/2014 Echo: EF 50-55%, no rwma, mild AS, mild MR, mildly dil LA. PASP 32 mmHg.  . Essential hypertension   . Dyslipidemia   . Current use of long term anticoagulation     a. Coumadin in setting of afib.  Marland Kitchen Hemorrhoids   . CAD (coronary artery disease)     a. S/P CABG x 1 @ time of MV annuloplasty;  b. 02/2010 ETT: neg for ischemia.  . Aortic stenosis 07/08/2015    Past Surgical History  Procedure Laterality Date  . Coronary artery bypass graft    . Mitral valve repair      Current Medications: Outpatient Prescriptions Prior to Visit  Medication Sig Dispense Refill  . amiodarone (PACERONE) 200 MG tablet Take 1 tablet (200 mg total) by mouth as directed. 1 tablet twice daily for 1 week then decrease to 1 tablet once a day 180 tablet 3  . Cholecalciferol (VITAMIN D-3  PO) Take 2,000 Units by mouth daily.    Marland Kitchen diltiazem (CARDIZEM CD) 120 MG 24 hr capsule Take 1 capsule (120 mg total) by mouth daily. Hold if systolic blood pressure (the top number) is less than 100. 30 capsule 3  . docusate sodium (COLACE) 100 MG capsule Take 100 mg by mouth 2 (two) times daily.    . hydrocortisone (ANUSOL-HC) 2.5 % rectal cream Place 1 application rectally 2 (two) times daily. 30 g 1  . levothyroxine (SYNTHROID, LEVOTHROID) 50 MCG tablet Take 50 mcg by mouth daily before breakfast.    . NATURAL PSYLLIUM FIBER PO Take 1 Dose by mouth daily as needed (constipation).       . pravastatin (PRAVACHOL) 80 MG tablet Take 1 tablet (80 mg total) by mouth daily. 30 tablet 11  . warfarin (COUMADIN) 2 MG tablet Take as directed     No facility-administered medications prior to visit.      Allergies:   Asa and Flomax   Social History   Social History  . Marital Status: Married    Spouse Name: N/A  . Number of Children: N/A  . Years of Education: N/A   Occupational History  . Truck Geophysicist/field seismologist     Retired    Social History Main Topics  . Smoking status: Never Smoker   . Smokeless tobacco: Never Used  . Alcohol Use: No  . Drug Use: No  . Sexual Activity: Not Currently   Other Topics Concern  . None   Social History Narrative     Family History:  The patient's family history includes Heart disease in his father; Heart failure in his brother; Stroke in his brother and mother.   ROS:   Please see the history of present illness.    ROS All other systems reviewed and are negative.   Physical Exam:    VS:  BP 130/80 mmHg  Pulse 62  Ht 5\' 8"  (1.727 m)  Wt 192 lb 12.8 oz (87.454 kg)  BMI 29.32 kg/m2   Physical Exam  Constitutional: He is oriented to person, place, and time. He appears well-developed and well-nourished.  HENT:  Head: Normocephalic and atraumatic.  Neck: Normal range of motion. No JVD present.  Cardiovascular: S1 normal and S2 normal.  An irregularly irregular rhythm present.  No murmur heard. Pulmonary/Chest: Effort normal and breath sounds normal. He has no rales.  Abdominal: Soft. Bowel sounds are normal. There is no tenderness.  Musculoskeletal: Normal range of motion. He exhibits no edema.  Neurological: He is alert and oriented to person, place, and time.  Skin: Skin is warm and dry.  Psychiatric: He has a normal mood and affect.    Wt Readings from Last 3 Encounters:  08/29/15 192 lb 12.8 oz (87.454 kg)  08/05/15 198 lb 12.8 oz (90.175 kg)  08/02/15 192 lb 6.4 oz (87.272 kg)      Studies/Labs Reviewed:     EKG:  EKG  is not ordered today.  The ekg ordered today demonstrates n/a ECG from the [facility] dated 08/26/15 demonstrates normal sinus rhythm, HR 77, normal axis, QTc 477 ms   Recent Labs: 09/09/2014: Hemoglobin 15.5; Platelets 149.0* 07/15/2015: ALT 17; BUN 26*; Creat 1.58*; Potassium 4.6; Sodium 139; TSH 2.77  Labs at the [facility] 07/20/15: Hemoglobin 15.2, TSH 3.54 Labs at the [facility] 08/26/15: INR 3.7  Recent Lipid Panel    Component Value Date/Time   CHOL 161 07/15/2015 0918   TRIG 111 07/15/2015 0918  HDL 43 07/15/2015 0918   CHOLHDL 3.7 07/15/2015 0918   VLDL 22 07/15/2015 0918   LDLCALC 96 07/15/2015 0918    Additional studies/ records that were reviewed today include:   Echo 07/21/15 Mild focal basal septal hypertrophy, EF 55-60%, normal wall motion, grade 3 diastolic dysfunction, mild aortic stenosis (mean gradient 13 mmHg), mitral valve repair intact with mild MR, moderate LAE  Carotid US 9/16 1-39% carotid stenosis - repeat study in 2 years  Holter x 48 hrs 08/26/14  Normal sinus bradycardia to sinus rhythm with average heart rate 65 bpm. Heart rate ranged from 38 to 97 bpm.  PAC's and atrial couplets  PVC's  one tracing with very bad baseline artifact and somewhat irregular and could not accurately interpret. Recommend event monitor to assess for PAF.  Echo 02/07/14 EF 50-55%, mild aortic stenosis (mean 14 mmHg), mitral valve repair okay with mild MR, mild LAE, PASP 32 mmHg  ETT 02/28/10 No ECG changes   ASSESSMENT:     1. PAF (paroxysmal atrial fibrillation) ([place])   2. On amiodarone therapy   3. Coronary artery disease involving native coronary artery of native heart without angina pectoris   4. H/O mitral valve repair   5. Aortic stenosis   6. Essential hypertension   7. Chronic diastolic heart failure (HCC)     PLAN:     In order of problems listed above:  1. PAF - The patient was recently placed back on amiodarone for rhythm  control. He was noted to be back in normal sinus rhythm by EKG at the [facility] several days ago. He sounds as though, by exam, that he is back in atrial fibrillation today. He has had some episodes of irregular heartbeats in the evening. I suspect he may be going in and out of atrial fibrillation. Therefore, there is no reason to consider cardioversion at this time. I will have him take another week of amiodarone 200 mg twice a day. After 1 week, he will resume 200 mg daily. I will have him see Dr. Radford Pax in the next 2 months. He has PFTs arranged later this week. TSH and LFTs should be obtained at follow-up in 2 months.  Coumadin is managed by pharmacy at the [facility].  2. Coronary artery disease - s/p CABG. He is not having angina. He is currently not on aspirin as he is on Coumadin Continue statin and calcium channel blocker.  3. H/O mitral valve repair: Stable by recent echocardiogram with mild mitral regurgitation. Continue SBE prophylaxis.  4. Aortic stenosis: Mild by recent echocardiogram with mean gradient 13 mmHg.  5. Essential hypertension: Controlled.  6. Chronic diastolic heart failure: Volume stable. He is not currently on diuretic.   Medication Adjustments/Labs and Tests Ordered: Current medicines are reviewed at length with the patient today.  Concerns regarding medicines are outlined above.  Medication changes, Labs and Tests ordered today are outlined in the Patient Instructions noted below. Patient Instructions  Medication Instructions:  1.TAKE AMIODARONE 200 MG TWICE A DAY FOR  ONE WEEK ONLY 2. THEN GO BACK TO 200 MG ONCE A DAY If you need a refill on your cardiac medications before your next appointment, please call your pharmacy. Labwork:  LFT TSH  SAME DAYS AS  FOLLOW UP WITH APPT DR TURNER  [facility] TO [facility]/INR.Marland Kitchen Testing/Procedures: .NONE ORDER TODAY Follow-Up: WITH DR TURNER IN 2 MONTHS  Any Other Special Instructions Will Be Listed Below (If  Applicable).   Signed,  [name], PA-C  08/29/2015 1:00 PM    [facility] [place], [place], [place]  [address] Phone: [phone]; Fax: [phone]

## 2015-08-29 ENCOUNTER — Encounter: Payer: Self-pay | Admitting: Physician Assistant

## 2015-08-29 ENCOUNTER — Other Ambulatory Visit (INDEPENDENT_AMBULATORY_CARE_PROVIDER_SITE_OTHER): Payer: Medicare Other | Admitting: *Deleted

## 2015-08-29 ENCOUNTER — Ambulatory Visit (INDEPENDENT_AMBULATORY_CARE_PROVIDER_SITE_OTHER): Payer: Medicare Other | Admitting: Physician Assistant

## 2015-08-29 VITALS — BP 130/80 | HR 62 | Ht 68.0 in | Wt 192.8 lb

## 2015-08-29 DIAGNOSIS — I480 Paroxysmal atrial fibrillation: Secondary | ICD-10-CM | POA: Diagnosis not present

## 2015-08-29 DIAGNOSIS — I10 Essential (primary) hypertension: Secondary | ICD-10-CM

## 2015-08-29 DIAGNOSIS — I350 Nonrheumatic aortic (valve) stenosis: Secondary | ICD-10-CM

## 2015-08-29 DIAGNOSIS — E785 Hyperlipidemia, unspecified: Secondary | ICD-10-CM | POA: Diagnosis not present

## 2015-08-29 DIAGNOSIS — I2510 Atherosclerotic heart disease of native coronary artery without angina pectoris: Secondary | ICD-10-CM | POA: Diagnosis not present

## 2015-08-29 DIAGNOSIS — Z9889 Other specified postprocedural states: Secondary | ICD-10-CM | POA: Diagnosis not present

## 2015-08-29 DIAGNOSIS — Z79899 Other long term (current) drug therapy: Secondary | ICD-10-CM | POA: Diagnosis not present

## 2015-08-29 DIAGNOSIS — I5032 Chronic diastolic (congestive) heart failure: Secondary | ICD-10-CM

## 2015-08-29 LAB — LIPID PANEL
CHOL/HDL RATIO: 3.3 ratio (ref <=5.0)
Cholesterol: 140 mg/dL (ref 125–200)
HDL: 43 mg/dL (ref >=40)
LDL CALC: 78 mg/dL (ref <130)
TRIGLYCERIDES: 93 mg/dL (ref <150)
VLDL: 19 mg/dL (ref <30)

## 2015-08-29 LAB — ALT: ALT: 19 U/L (ref 9–46)

## 2015-08-29 NOTE — Patient Instructions (Addendum)
Medication Instructions:  1.TAKE AMIODARONE 200 MG TWICE A DAY FOR  ONE WEEK ONLY 2. THEN GO BACK TO 200 MG ONCE A DAY If you need a refill on your cardiac medications before your next appointment, please call your pharmacy. Labwork:  LFT TSH  SAME DAYS AS  FOLLOW UP WITH APPT DR TURNER  [facility] PT/INR.Marland Kitchen Testing/Procedures: .NONE ORDER TODAY Follow-Up: WITH DR TURNER IN 2 MONTHS  Any Other Special Instructions Will Be Listed Below (If Applicable).

## 2015-08-31 ENCOUNTER — Ambulatory Visit (INDEPENDENT_AMBULATORY_CARE_PROVIDER_SITE_OTHER): Payer: Medicare Other | Admitting: Internal Medicine

## 2015-08-31 DIAGNOSIS — I480 Paroxysmal atrial fibrillation: Secondary | ICD-10-CM | POA: Diagnosis not present

## 2015-08-31 LAB — PULMONARY FUNCTION TEST
DL/VA % pred: 100 %
DL/VA: 4.54 ml/min/mmHg/L
DLCO cor % pred: 75 %
DLCO cor: 23.26 ml/min/mmHg
DLCO unc % pred: 75 %
DLCO unc: 23.32 ml/min/mmHg
FEF 25-75 Post: 3.09 L/s
FEF 25-75 Pre: 2.56 L/s
FEF2575-%Change-Post: 20 %
FEF2575-%Pred-Post: 170 %
FEF2575-%Pred-Pre: 141 %
FEV1-%Change-Post: 4 %
FEV1-%Pred-Post: 101 %
FEV1-%Pred-Pre: 97 %
FEV1-Post: 2.73 L
FEV1-Pre: 2.61 L
FEV1FVC-%Change-Post: 4 %
FEV1FVC-%Pred-Pre: 113 %
FEV6-%Change-Post: 0 %
FEV6-%Pred-Post: 91 %
FEV6-%Pred-Pre: 91 %
FEV6-Post: 3.24 L
FEV6-Pre: 3.24 L
FEV6FVC-%Pred-Post: 107 %
FEV6FVC-%Pred-Pre: 107 %
FVC-%Change-Post: 0 %
FVC-%Pred-Post: 85 %
FVC-%Pred-Pre: 85 %
FVC-Post: 3.24 L
FVC-Pre: 3.24 L
Post FEV1/FVC ratio: 84 %
Post FEV6/FVC ratio: 100 %
Pre FEV1/FVC ratio: 81 %
Pre FEV6/FVC Ratio: 100 %
RV % pred: 69 %
RV: 1.83 L
TLC % pred: 74 %
TLC: 5.14 L

## 2015-08-31 NOTE — Progress Notes (Signed)
PFT done today. 

## 2015-09-01 ENCOUNTER — Telehealth: Payer: Self-pay | Admitting: Cardiology

## 2015-09-01 NOTE — Telephone Encounter (Signed)
Received 5 pages of Medical records from [facility], fowarded 5 pages to Dr. Fransico Him 09/01/15 fbg

## 2015-09-06 ENCOUNTER — Telehealth: Payer: Self-pay

## 2015-09-06 DIAGNOSIS — R942 Abnormal results of pulmonary function studies: Secondary | ICD-10-CM

## 2015-09-06 NOTE — Telephone Encounter (Signed)
Informed patient of results and verbal understanding expressed.  Pulmonary referral placed. Patient agrees with treatment plan.

## 2015-09-06 NOTE — Telephone Encounter (Signed)
-----   Message from Sueanne Margarita, MD sent at 09/01/2015  9:05 AM EDT ----- Reduced DLCO possibly due to interstitial process.  Please refer patient to Dr. Chase Caller to evaluated if we can continue with Amio

## 2015-09-08 ENCOUNTER — Telehealth: Payer: Self-pay | Admitting: Cardiology

## 2015-09-08 NOTE — Telephone Encounter (Signed)
New message   Pt wife is calling to see about the referral for the lung test that Dr.Turner ordered

## 2015-09-08 NOTE — Telephone Encounter (Signed)
Informed [name] that no further pulmonary testing was ordered, but a referral to see a lung doctor was placed. She wants to know how much it will cost. Instructed her to call her insurance company for pricing. She understands they are waiting for a call to schedule OV. She was grateful for call.

## 2015-09-09 ENCOUNTER — Ambulatory Visit (INDEPENDENT_AMBULATORY_CARE_PROVIDER_SITE_OTHER): Payer: Medicare Other | Admitting: Internal Medicine

## 2015-09-09 ENCOUNTER — Encounter: Payer: Self-pay | Admitting: Internal Medicine

## 2015-09-09 VITALS — BP 126/66 | HR 102 | Ht 68.0 in | Wt 193.0 lb

## 2015-09-09 DIAGNOSIS — Z79899 Other long term (current) drug therapy: Secondary | ICD-10-CM | POA: Diagnosis not present

## 2015-09-09 DIAGNOSIS — R942 Abnormal results of pulmonary function studies: Secondary | ICD-10-CM

## 2015-09-09 NOTE — Patient Instructions (Addendum)
ICD-9-CM ICD-10-CM   1. Abnormal PFT 794.2 R94.2   2. On amiodarone therapy V58.69 Z79.899    Do HRCT chest - will call with rsults  - need to compare to 2015 to make recs on continued amiodarone threapy  followup  - depending on CT results

## 2015-09-09 NOTE — Progress Notes (Signed)
Subjective:    Patient ID: [name], male    DOB: 1931-06-15, 82 y.o.   MRN: [id number]  PCP [name], MD   HPI   IOV 09/09/2015  Chief Complaint  Patient presents with  . Advice Only    Referred by Dr. Radford Pax for abnormal PFT.  PFT from 08/31/15 in [place]. Pt has no breathing complaints.     82 year old male referred by Dr. Radford Pax in cardiology for abnormal pulmonary function test. He presents with his wife. At baseline he says he does not have any problems. Only thing was in 2015 he got admitted for weakness according to chart review. At that time he had a CT scan of the chest high resolution that was read by thoracic radiology and the possibility of bilateral bibasal atelectasis versus subtle interstitial lung disease was raised. However he is continued to remain asymptomatic. Sometime a year ago he was started on amiodarone for a few weeks but then most recently a few months ago went back on amiodarone because of atrial fibrillation according to him and chart review and his wife. He subsequently had pulmonary function test Pulmonary function test personally visualized and done on 08/31/2015 was normal except for mild restriction on total lung capacity 5.14 L/74% and slight reduction in DLCO 23.26/75%. Correlating with this is a 2015 CT chest read by thoracic radiologist suggesting possible ILD versus basal atelectasis  Therefore he has been referred here. He is puzzled by the referral because he feels he is asymptomatic. He works out regularly at fitness and feels well.     has a past medical history of PAF (paroxysmal atrial fibrillation) ([place]); Diverticulosis; TIA (transient ischemic attack); Hyperlipidemia; Hypertension; Hemorrhoids; Adenomatous colon polyp; H/O mitral valve repair; Chronic diastolic heart failure ([place]); Essential hypertension; Dyslipidemia; Current use of long term anticoagulation; Hemorrhoids; CAD (coronary artery disease); and Aortic stenosis  (07/08/2015).   reports that he quit smoking about 60 years ago. His smoking use included Cigarettes. He has a .2 pack-year smoking history. He has never used smokeless tobacco.  Past Surgical History  Procedure Laterality Date  . Coronary artery bypass graft    . Mitral valve repair      Allergies  Allergen Reactions  . Asa [Aspirin]     Bleeding  . Flomax [Tamsulosin]     Made his heart go out of rhythm    Immunization History  Administered Date(s) Administered  . Influenza Split 02/09/2015    Family History  Problem Relation Age of Onset  . Heart failure Brother     Deceased  . Stroke Mother     Deceased  . Heart disease Father     Deceased  . Stroke Brother      Current outpatient prescriptions:  .  amiodarone (PACERONE) 200 MG tablet, Take 1 tablet (200 mg total) by mouth as directed. 1 tablet twice daily for 1 week then decrease to 1 tablet once a day, Disp: 180 tablet, Rfl: 3 .  Cholecalciferol (VITAMIN D-3 PO), Take 2,000 Units by mouth daily., Disp: , Rfl:  .  diltiazem (CARDIZEM CD) 120 MG 24 hr capsule, Take 1 capsule (120 mg total) by mouth daily. Hold if systolic blood pressure (the top number) is less than 100., Disp: 30 capsule, Rfl: 3 .  docusate sodium (COLACE) 100 MG capsule, Take 100 mg by mouth 2 (two) times daily., Disp: , Rfl:  .  hydrocortisone (ANUSOL-HC) 2.5 % rectal cream, Place 1 application rectally 2 (two) times daily., Disp:  30 g, Rfl: 1 .  levothyroxine (SYNTHROID, LEVOTHROID) 50 MCG tablet, Take 50 mcg by mouth daily before breakfast., Disp: , Rfl:  .  NATURAL PSYLLIUM FIBER PO, Take 1 Dose by mouth daily as needed (constipation). , Disp: , Rfl:  .  pravastatin (PRAVACHOL) 80 MG tablet, Take 1 tablet (80 mg total) by mouth daily., Disp: 30 tablet, Rfl: 11 .  warfarin (COUMADIN) 2 MG tablet, Take as directed, Disp: , Rfl:      Review of Systems  Constitutional: Negative for fever and unexpected weight change.  HENT: Negative for  congestion, dental problem, ear pain, nosebleeds, postnasal drip, rhinorrhea, sinus pressure, sneezing, sore throat and trouble swallowing.   Eyes: Negative for redness and itching.  Respiratory: Negative for cough, chest tightness, shortness of breath and wheezing.   Cardiovascular: Negative for palpitations and leg swelling.  Gastrointestinal: Negative for nausea and vomiting.  Genitourinary: Negative for dysuria.  Musculoskeletal: Negative for joint swelling.  Skin: Negative for rash.  Neurological: Negative for headaches.  Hematological: Does not bruise/bleed easily.  Psychiatric/Behavioral: Negative for dysphoric mood. The patient is not nervous/anxious.        Objective:   Physical Exam  Constitutional: He is oriented to person, place, and time. He appears well-developed and well-nourished. No distress.  Younger than stated age Well built  HENT:  Head: Normocephalic and atraumatic.  Right Ear: External ear normal.  Left Ear: External ear normal.  Mouth/Throat: Oropharynx is clear and moist. No oropharyngeal exudate.  sligh hoh  Eyes: Conjunctivae and EOM are normal. Pupils are equal, round, and reactive to light. Right eye exhibits no discharge. Left eye exhibits no discharge. No scleral icterus.  Neck: Normal range of motion. Neck supple. No JVD present. No tracheal deviation present. No thyromegaly present.  Cardiovascular: Normal rate, regular rhythm and intact distal pulses.  Exam reveals no gallop and no friction rub.   No murmur heard. Surgical scar in chest +  Pulmonary/Chest: Effort normal. No respiratory distress. He has no wheezes. He has rales. He exhibits no tenderness.  ? Rt base crackles  Abdominal: Soft. Bowel sounds are normal. He exhibits no distension and no mass. There is no tenderness. There is no rebound and no guarding.  Visceral obesity + Truss +  Musculoskeletal: Normal range of motion. He exhibits no edema or tenderness.  Muscular +   Lymphadenopathy:    He has no cervical adenopathy.  Neurological: He is alert and oriented to person, place, and time. He has normal reflexes. No cranial nerve deficit. Coordination normal.  Skin: Skin is warm and dry. No rash noted. He is not diaphoretic. No erythema. No pallor.  Psychiatric: He has a normal mood and affect. His behavior is normal. Judgment and thought content normal.  Nursing note and vitals reviewed.   Filed Vitals:   09/09/15 1123  BP: 126/66  Pulse: 102  Height: 5\' 8"  (1.727 m)  Weight: 193 lb (87.544 kg)  SpO2: 98%         Assessment & Plan:     ICD-9-CM ICD-10-CM   1. Abnormal PFT 794.2 R94.2 CT Chest High Resolution  2. On amiodarone therapy V58.69 Z79.899 CT Chest High Resolution   At this point I'm not so sure that he has amiodarone lung toxicity. It is quite possible that he has subtle pre-existing interstitial lung disease that can explain his slight bleeding low DLCO on the other hand this could just be a normal variant. Is slightly lower lung capacity could easily be  because of his previous CABG. We will get a high-resolution CT scan of the chest supine and prone images and decide. If it looks like he has stable ILD I would still continue to favor that he continue amiodarone.   He and his wife are in agreement with the plan   Dr. Brand Males, M.D., [facility].C.P Pulmonary and Critical Care Medicine Staff Physician [place] [facility] Pager: [phone], If no answer or between  15:00h - 7:00h: call [phone]  09/09/2015 12:21 PM

## 2015-09-16 ENCOUNTER — Ambulatory Visit (INDEPENDENT_AMBULATORY_CARE_PROVIDER_SITE_OTHER)
Admission: RE | Admit: 2015-09-16 | Discharge: 2015-09-16 | Disposition: A | Discharge status: Home / Self Care | Payer: Medicare Other | Source: Ambulatory Visit | Attending: Internal Medicine | Admitting: Internal Medicine

## 2015-09-16 DIAGNOSIS — Z79899 Other long term (current) drug therapy: Secondary | ICD-10-CM

## 2015-09-16 DIAGNOSIS — R942 Abnormal results of pulmonary function studies: Secondary | ICD-10-CM

## 2015-09-19 ENCOUNTER — Telehealth: Payer: Self-pay | Admitting: Internal Medicine

## 2015-09-19 DIAGNOSIS — R918 Other nonspecific abnormal finding of lung field: Secondary | ICD-10-CM

## 2015-09-19 NOTE — Telephone Encounter (Signed)
[name]  LEt him know he does have some ILD in lung base and is similar to 2015 without change.  Let him know that I have sent message to Dr Radford Pax that is ok to continue amio for now Give him fu in 6 months with full PFT - also do ANA, RF and CCP blood test now or when he returns  Thanks  Dr. Brand Males, M.D., [facility].C.P Pulmonary and Critical Care Medicine Staff Physician [place] [facility] Pager: [phone], If no answer or between  15:00h - 7:00h: call [phone]  09/19/2015 11:27 AM

## 2015-09-19 NOTE — Telephone Encounter (Signed)
Hi Traci  1. He has ILD that is at base and stable since end 2015. Doubt this is due to [facility] given timing. More likely IPF given male gender and age. IF so is early diseae and mild.   2. 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 I am ok with you continuing amio for time being. I wil do some autoimmune work up and see him for fu  THanks  Dr. Brand Males, M.D., [facility].C.P Pulmonary and Critical Care Medicine Staff Physician [place] [facility] Pager: [phone], If no answer or between  15:00h - 7:00h: call [phone]  09/19/2015 11:26 AM

## 2015-09-22 NOTE — Telephone Encounter (Signed)
Spoke with pt and wife, aware of results and recs.  Reminder placed for rov and pft with labs at return (at pt's wife request).   Nothing further needed.

## 2015-10-05 ENCOUNTER — Encounter: Payer: Self-pay | Admitting: Cardiology

## 2015-10-06 ENCOUNTER — Encounter: Payer: Self-pay | Admitting: Cardiology

## 2015-10-17 ENCOUNTER — Institutional Professional Consult (permissible substitution): Payer: Medicare Other | Admitting: Pulmonary Disease

## 2015-11-10 ENCOUNTER — Telehealth: Payer: Self-pay

## 2015-11-10 ENCOUNTER — Encounter (INDEPENDENT_AMBULATORY_CARE_PROVIDER_SITE_OTHER): Payer: Self-pay

## 2015-11-10 ENCOUNTER — Encounter: Payer: Self-pay | Admitting: Gastroenterology

## 2015-11-10 ENCOUNTER — Ambulatory Visit (INDEPENDENT_AMBULATORY_CARE_PROVIDER_SITE_OTHER): Payer: Medicare Other | Admitting: Gastroenterology

## 2015-11-10 VITALS — BP 110/70 | HR 80 | Ht 68.0 in | Wt 199.0 lb

## 2015-11-10 DIAGNOSIS — Z7901 Long term (current) use of anticoagulants: Secondary | ICD-10-CM | POA: Diagnosis not present

## 2015-11-10 DIAGNOSIS — K649 Unspecified hemorrhoids: Secondary | ICD-10-CM

## 2015-11-10 NOTE — Telephone Encounter (Signed)
    Dear Dr. Radford Pax ,    We have scheduled the above patient for an hemorrhoid banding. Our records show that he is on anticoagulation therapy.   Please advise if patient can come off coumadin 48 hours prior to banding and 1 week after banding, which is scheduled for 12/06/15. We have also recommended patient start an aspirin during the time he is off coumadin.  Please route your answer to Marlon Pel, CMA  Sincerely,    Marlon Pel, CMA

## 2015-11-10 NOTE — Telephone Encounter (Addendum)
Please send this to Coumadin clniic - for instruction on holding.  Reviewed chart and patient's anticoagulation is managed by the [place].  Please have him come in to make sure he is in NSR and if he is then [facility] [place] needs to manage his coumadin for surgery

## 2015-11-10 NOTE — Telephone Encounter (Signed)
Attempted to call patient to schedule EKG. After several rings, there was no answer or way to leave VM.  Will try again later.

## 2015-11-10 NOTE — Progress Notes (Signed)
HPI :  82 y/o male here for further evaluation for hemorrhoids. He has a history of CAD, CHF, and atrial fibrillation on coumadin.  He had a colonoscopy on 06/2011 showing a small adenoma, and then another reported colonoscopy at the [place] in 2015 in which another small polyp was removed, no pathology available.   He reports he has remotely had banding of hemorrhoids by general surgeon. He thinks that was done within the past few years for bleeding symptoms and it worked well at the time. He denies any significant bleeding from the hemorrhoids, but has had some mild bleeding episodes. He has been using hydrocortizone cream PRN for this issue. He thinks the last time he had any bleeding about 5-6 weeks ago, was a mild amount. He has roughly 1-2 BMs per day. He is using a stool softener which is keeping stools soft. He denies any straining. He denies any perianal irritation. Overall, he appears to have had intermittent bleeding for a while from his hemorrhoids.   Colonoscopy 06/2011 - 74mm cecal adenoma, with melanosis coli  Past Medical History:  Diagnosis Date  . Adenomatous colon polyp   . Aortic stenosis 07/08/2015  . CAD (coronary artery disease)    a. S/P CABG x 1 @ time of MV annuloplasty;  b. 02/2010 ETT: neg for ischemia.  . Chronic diastolic heart failure ([place])    a. 01/2014 Echo: EF 50-55%, no rwma, mild AS, mild MR, mildly dil LA. PASP 32 mmHg.  . Current use of long term anticoagulation    a. Coumadin in setting of afib.  . Diverticulosis    Colonoscopy  . Dyslipidemia   . Essential hypertension   . H/O mitral valve repair   . Hemorrhoids   . Hemorrhoids   . Hyperlipidemia   . Hypertension   . PAF (paroxysmal atrial fibrillation) ([facility])    a. CHA2DS2VASc = 6 -->chronic coumadin;  b. Prev on amio-->d/c 2/2 hypothyroidism. >> resumed 5/16  . TIA (transient ischemic attack)      Past Surgical History:  Procedure Laterality Date  . CORONARY ARTERY BYPASS GRAFT    . MITRAL VALVE  REPAIR     Family History  Problem Relation Age of Onset  . Stroke Mother     Deceased  . Heart disease Father     Deceased  . Heart failure Brother     Deceased  . Stroke Brother    Social History  Substance Use Topics  . Smoking status: Former Smoker    Packs/day: 0.10    Years: 2.00    Types: Cigarettes    Quit date: 03/20/1955  . Smokeless tobacco: Never Used  . Alcohol use No   Current Outpatient Prescriptions  Medication Sig Dispense Refill  . amiodarone (PACERONE) 200 MG tablet Take 1 tablet (200 mg total) by mouth as directed. 1 tablet twice daily for 1 week then decrease to 1 tablet once a day 180 tablet 3  . Cholecalciferol (VITAMIN D-3 PO) Take 2,000 Units by mouth daily.    Marland Kitchen diltiazem (CARDIZEM CD) 120 MG 24 hr capsule Take 1 capsule (120 mg total) by mouth daily. Hold if systolic blood pressure (the top number) is less than 100. 30 capsule 3  . docusate sodium (COLACE) 100 MG capsule Take 100 mg by mouth 2 (two) times daily.    . hydrocortisone (ANUSOL-HC) 2.5 % rectal cream Place 1 application rectally 2 (two) times daily. 30 g 1  . levothyroxine (SYNTHROID, LEVOTHROID) 50 MCG tablet  Take 50 mcg by mouth daily before breakfast.    . NATURAL PSYLLIUM FIBER PO Take 1 Dose by mouth daily as needed (constipation).     . pravastatin (PRAVACHOL) 80 MG tablet Take 1 tablet (80 mg total) by mouth daily. 30 tablet 11  . warfarin (COUMADIN) 2 MG tablet Take as directed     No current facility-administered medications for this visit.    Allergies  Allergen Reactions  . Asa [Aspirin]     Bleeding  . Flomax [Tamsulosin]     Made his heart go out of rhythm     Review of Systems: All systems reviewed and negative except where noted in HPI.   Lab Results  Component Value Date   WBC 6.1 09/09/2014   HGB 15.5 09/09/2014   HCT 46.0 09/09/2014   MCV 91.1 09/09/2014   PLT 149.0 (L) 09/09/2014    Lab Results  Component Value Date   ALT 19 08/29/2015   AST 18  07/15/2015   ALKPHOS 54 07/15/2015   BILITOT 0.8 07/15/2015    Lab Results  Component Value Date   CREATININE 1.58 (H) 07/15/2015   BUN 26 (H) 07/15/2015   NA 139 07/15/2015   K 4.6 07/15/2015   CL 104 07/15/2015   CO2 25 07/15/2015     Physical Exam: BP 110/70 (BP Location: Left Arm, Patient Position: Sitting, Cuff Size: Normal)   Pulse 80   Ht 5\' 8"  (1.727 m)   Wt 199 lb (90.3 kg)   BMI 30.26 kg/m  Constitutional: Pleasant,well-developed, male in no acute distress. HEENT: Normocephalic and atraumatic. Conjunctivae are normal. No scleral icterus. Neck supple.  Cardiovascular: Normal rate, regular rhythm.  Pulmonary/chest: Effort normal and breath sounds normal. No wheezing, rales or rhonchi. Abdominal: Soft, nondistended, nontender. Diastasis recti hernia. Bowel sounds active throughout. There are no masses palpable.  DRE / Anoscopy - external hemorrhoids, anoscopy with large internal hemorrhoids. Extremities: no edema Lymphadenopathy: No cervical adenopathy noted. Neurological: Alert and oriented to person place and time. Skin: Skin is warm and dry. No rashes noted. Psychiatric: Normal mood and affect. Behavior is normal.   ASSESSMENT AND PLAN: 82 yo male with AF on coumadin, presenting with intermittent ongoing rectal bleeding. He's had 2 colonoscopies in the past 4 years without any other pathology to cause this. He has large internal hemorrhoids on anoscopy and external hemorrhoids. Improved on cortizone cream and fiber supplements but symptoms persisting to a milder level. I do think he would benefit from hemorrhoid banding in regards to minimizing his bleeding symptoms, but there is increased risk of bleeding from banding while on coumadin. The other options is to monitor for now on medical therapy. After a discussion he wishes to have banding. In order to minimize bleeding risk he would ideally be able to hold coumadin about 48 hours prior to banding and would hold it in  total for about a week. We will reach out to cardiology to see if he would be able to hold coumadin for this, and schedule if so. He can take an aspirin during this time. He will come back for banding once we hear from cardiology pending their opinion.   Greasy Cellar, MD [facility] Gastroenterology Pager [phone]

## 2015-11-10 NOTE — Patient Instructions (Signed)
We have scheduled your hemorrhoid banding for 12/06/15 at 4:00pm.   We will contact you regarding your coumadin clearance prior to your banding.   We do recommend starting an aspirin while you are off of your coumadin.

## 2015-11-18 ENCOUNTER — Ambulatory Visit: Payer: Medicare Other | Admitting: Cardiology

## 2015-12-06 ENCOUNTER — Encounter: Payer: Medicare Other | Admitting: Gastroenterology

## 2015-12-08 ENCOUNTER — Telehealth: Payer: Self-pay | Admitting: Gastroenterology

## 2015-12-08 NOTE — Telephone Encounter (Signed)
Left message for patient to call back  

## 2015-12-09 NOTE — Telephone Encounter (Signed)
Patient's wife notified that they will need to get contact from Dr. Allyn Kenner at the [place].  All the phone numbers listed that the patient's wife provided don't work.  She will work on getting a contact number and call us back.  He also has an appt with Dr. Radford Pax on 12/16/15 with cardiology.  She wanted a EKG prior to obtaining permission to come off of coumadin.  See notes on 11/10/15

## 2015-12-12 ENCOUNTER — Encounter: Payer: Self-pay | Admitting: Gastroenterology

## 2015-12-14 NOTE — Telephone Encounter (Signed)
Patient has OV with Dr. Radford Pax scheduled 9/29.

## 2015-12-16 ENCOUNTER — Ambulatory Visit (INDEPENDENT_AMBULATORY_CARE_PROVIDER_SITE_OTHER): Payer: Medicare Other | Admitting: *Deleted

## 2015-12-16 ENCOUNTER — Ambulatory Visit (INDEPENDENT_AMBULATORY_CARE_PROVIDER_SITE_OTHER): Payer: Medicare Other | Admitting: Cardiology

## 2015-12-16 ENCOUNTER — Encounter: Payer: Self-pay | Admitting: Cardiology

## 2015-12-16 VITALS — BP 122/80 | HR 85 | Ht 68.0 in | Wt 196.1 lb

## 2015-12-16 DIAGNOSIS — I481 Persistent atrial fibrillation: Secondary | ICD-10-CM

## 2015-12-16 DIAGNOSIS — I2510 Atherosclerotic heart disease of native coronary artery without angina pectoris: Secondary | ICD-10-CM

## 2015-12-16 DIAGNOSIS — Z7901 Long term (current) use of anticoagulants: Secondary | ICD-10-CM

## 2015-12-16 DIAGNOSIS — I5032 Chronic diastolic (congestive) heart failure: Secondary | ICD-10-CM | POA: Diagnosis not present

## 2015-12-16 DIAGNOSIS — E785 Hyperlipidemia, unspecified: Secondary | ICD-10-CM

## 2015-12-16 DIAGNOSIS — I6523 Occlusion and stenosis of bilateral carotid arteries: Secondary | ICD-10-CM

## 2015-12-16 DIAGNOSIS — I10 Essential (primary) hypertension: Secondary | ICD-10-CM | POA: Diagnosis not present

## 2015-12-16 DIAGNOSIS — I480 Paroxysmal atrial fibrillation: Secondary | ICD-10-CM | POA: Diagnosis not present

## 2015-12-16 DIAGNOSIS — I350 Nonrheumatic aortic (valve) stenosis: Secondary | ICD-10-CM

## 2015-12-16 DIAGNOSIS — I4819 Other persistent atrial fibrillation: Secondary | ICD-10-CM

## 2015-12-16 LAB — POCT INR: INR: 2

## 2015-12-16 MED ORDER — RIVAROXABAN 20 MG PO TABS: 20.0000 mg | ORAL_TABLET | Freq: Every day | ORAL | 0 refills | Status: DC

## 2015-12-16 MED ORDER — DILTIAZEM HCL ER COATED BEADS 180 MG PO CP24: 180.0000 mg | ORAL_CAPSULE | Freq: Every day | ORAL | 0 refills | Status: DC

## 2015-12-16 NOTE — Patient Instructions (Addendum)
Medication Instructions:  Stop amiodarone.  Increase Cardizem CD  to 180 mg daily.  Stop coumadin (warfarin). Start Xarelto 20mg  daily. Coumadin Clinic will you see today and give you instructions about this.  Labwork: Your physician recommends that you return for a FASTING lipid profile/ Liver profile/BMET in about 1 week.   Testing/Procedures: Non e  Follow-Up: Your physician recommends that you schedule a follow-up appointment in: about 2 weeks with PA/NP.  Your physician wants you to follow-up in: 6 months with Dr Radford Pax. (March 2018).  You will receive a reminder letter in the mail two months in advance. If you don't receive a letter, please call our office to schedule the follow-up appointment.        If you need a refill on your cardiac medications before your next appointment, please call your pharmacy.

## 2015-12-16 NOTE — Telephone Encounter (Signed)
Reviewed notes from visit with Dr Claiborne Billings. Per notes pt was started on Xarelto. We need to  schedule pt  for hemorrhoid banding with Dr Havery Moros and he will need to be off of the Xarelto. How many days can the pt be off of this medication?

## 2015-12-16 NOTE — Progress Notes (Signed)
_   Cardiology Office Note    Date:  12/16/2015   ID:  [name], DOB 06/23/1931, MRN [id number]  PCP:  [name], MD  Cardiologist:  Fransico Him, MD   Chief Complaint  Patient presents with  . Coronary Artery Disease  . Hypertension  . Atrial Fibrillation  . Hyperlipidemia    History of Present Illness:  [name] is a 82 y.o. male with a history of ASCAD with single vessel CABG, MV annuloplasty, TIA, Persistent atrial fibrillation on coumadin, HTN, and dyslipidemia who presents today for followup. When I saw him in May he was in recurrent atrial fibrillation.  His INR was subtherapeutic and he was off his Amio to due concern about potential side effects.  His coumadin was adjusted and after his INR was > 2 for 3 weeks his Amio was restarted.  He was supposed to come back to set up DCCV but never returned.  His CHADS2VASC score is 6 and he is on anticoagulation. He is doing well today.  He denies any SOB, DOE, chest pain, LE edema. He denies any  dizziness or syncope. He goes to the gym several times weekly and exercises with no problems.    Past Medical History:  Diagnosis Date  . Adenomatous colon polyp   . Aortic stenosis 07/08/2015  . CAD (coronary artery disease)    a. S/P CABG x 1 @ time of MV annuloplasty;  b. 02/2010 ETT: neg for ischemia.  . Chronic diastolic heart failure ([place])    a. 01/2014 Echo: EF 50-55%, no rwma, mild AS, mild MR, mildly dil LA. PASP 32 mmHg.  . Current use of long term anticoagulation    a. Coumadin in setting of afib.  . Diverticulosis    Colonoscopy  . Dyslipidemia   . Essential hypertension   . H/O mitral valve repair   . Hemorrhoids   . Hyperlipidemia   . Hypertension   . Persistent atrial fibrillation (HCC)    a. CHA2DS2VASc = 6 -->chronic coumadin;  b. Prev on amio-->d/c 2/2 hypothyroidism. >> resumed 5/16  . TIA (transient ischemic attack)     Past Surgical History:  Procedure Laterality Date  . CORONARY ARTERY  BYPASS GRAFT    . MITRAL VALVE REPAIR      Current Medications: Outpatient Medications Prior to Visit  Medication Sig Dispense Refill  . amiodarone (PACERONE) 200 MG tablet Take 1 tablet (200 mg total) by mouth as directed. 1 tablet twice daily for 1 week then decrease to 1 tablet once a day 180 tablet 3  . Cholecalciferol (VITAMIN D-3 PO) Take 2,000 Units by mouth daily.    Marland Kitchen diltiazem (CARDIZEM CD) 120 MG 24 hr capsule Take 1 capsule (120 mg total) by mouth daily. Hold if systolic blood pressure (the top number) is less than 100. 30 capsule 3  . docusate sodium (COLACE) 100 MG capsule Take 100 mg by mouth 2 (two) times daily.    . hydrocortisone (ANUSOL-HC) 2.5 % rectal cream Place 1 application rectally 2 (two) times daily. 30 g 1  . levothyroxine (SYNTHROID, LEVOTHROID) 50 MCG tablet Take 50 mcg by mouth daily before breakfast.    . NATURAL PSYLLIUM FIBER PO Take 1 Dose by mouth daily as needed (constipation).     . pravastatin (PRAVACHOL) 80 MG tablet Take 1 tablet (80 mg total) by mouth daily. 30 tablet 11  . warfarin (COUMADIN) 2 MG tablet Take as directed     No facility-administered medications prior  to visit.      Allergies:   Asa [aspirin] and Flomax [tamsulosin]   Social History   Social History  . Marital status: Married    Spouse name: N/A  . Number of children: N/A  . Years of education: N/A   Occupational History  . Truck Geophysicist/field seismologist     Retired    Social History Main Topics  . Smoking status: Former Smoker    Packs/day: 0.10    Years: 2.00    Types: Cigarettes    Quit date: 03/20/1955  . Smokeless tobacco: Never Used  . Alcohol use No  . Drug use: No  . Sexual activity: Not Currently   Other Topics Concern  . None   Social History Narrative  . None     Family History:  The patient's family history includes Heart disease in his father; Heart failure in his brother; Stroke in his brother and mother.   ROS:   Please see the history of present illness.     ROS All other systems reviewed and are negative.  No flowsheet data found.     PHYSICAL EXAM:   VS:  BP 122/80 (BP Location: Right Arm, Patient Position: Sitting, Cuff Size: Normal)   Pulse 85   Ht 5\' 8"  (1.727 m)   Wt 196 lb 1.9 oz (89 kg)   BMI 29.82 kg/m    GEN: Well nourished, well developed, in no acute distress  HEENT: normal  Neck: no JVD, carotid bruits, or masses Cardiac: RRR; no murmurs, rubs, or gallops,no edema.  Intact distal pulses bilaterally.  Respiratory:  clear to auscultation bilaterally, normal work of breathing GI: soft, nontender, nondistended, + BS MS: no deformity or atrophy  Skin: warm and dry, no rash Neuro:  Alert and Oriented x 3, Strength and sensation are intact Psych: euthymic mood, full affect  Wt Readings from Last 3 Encounters:  12/16/15 196 lb 1.9 oz (89 kg)  11/10/15 199 lb (90.3 kg)  09/09/15 193 lb (87.5 kg)      Studies/Labs Reviewed:   EKG:  EKG is ordered today.  The ekg ordered today demonstrates atrial fibrillation with CVR at 85bpm with nonspecific ST anbnormality  Recent Labs: 07/15/2015: BUN 26; Creat 1.58; Potassium 4.6; Sodium 139; TSH 2.77 08/29/2015: ALT 19   Lipid Panel    Component Value Date/Time   CHOL 140 08/29/2015 0930   TRIG 93 08/29/2015 0930   HDL 43 08/29/2015 0930   CHOLHDL 3.3 08/29/2015 0930   VLDL 19 08/29/2015 0930   LDLCALC 78 08/29/2015 0930    Additional studies/ records that were reviewed today include:  none    ASSESSMENT:    1. Persistent atrial fibrillation ([place])   2. Essential hypertension   3. Chronic diastolic heart failure (HCC)   4. Carotid artery stenosis, bilateral   5. Coronary artery disease involving native coronary artery of native heart without angina pectoris   6. Aortic stenosis   7. Dyslipidemia      PLAN:  In order of problems listed above:  1. Persistent atrial fibrillation. We discussed rhythm vs. Rate control.  He wants to continue rate control as he is  completely asymptomatic and wants to avoid the side effects of Amio.  I will stop Amio and increase Cardizem to 180mg  daily to make sure he remains rate controlled off Amio.  He also is interested in changing from warfarin to [place] so I will refer him to coumadin clinic today.   2. HTN - BP  controlled on current meds.  Continue Cardizem. 3. Chronic diastolic CHF(grade 3 DD on echo) - he appears euvolemic on exam today.  His weight is stable. 4. Mild bilateral carotid artery stenosis (1-39%) - repeat dopplers in 1 year.  Continue statin.  5. ASCAD s/p remote CABGF x 1 at time of MV annuloplasty.  He has not had any angina.  He is not on ASA due to warfarin.  Continue statin.   6. Mild Aortic stenosis with mean AVG 63mmHg by echo 07/2015. 7. Hyperlipidemia with LDL goal < 70.  Continue statin.  Last LDL 78 in June.  He refused Zetia at that time. I will repeat FLP and ALT.    Medication Adjustments/Labs and Tests Ordered: Current medicines are reviewed at length with the patient today.  Concerns regarding medicines are outlined above.  Medication changes, Labs and Tests ordered today are listed in the Patient Instructions below.  There are no Patient Instructions on file for this visit.   Signed, Fransico Him, MD  12/16/2015 8:49 AM    [facility] [address], [place], [place]  [address] Phone: [phone]; Fax: [phone]

## 2015-12-16 NOTE — Patient Instructions (Addendum)
INR today is 2.0, can start Xarelto 20mg  today with dinner largest meal of the day.  A full discussion of the nature of anticoagulants has been carried out.  A benefit/risk analysis has been presented to the patient, so that they understand the justification for choosing anticoagulation with Xarelto at this time.  The need for compliance is stressed.  Pt is aware to take the medication once daily with the largest meal of the day.  Side effects of potential bleeding are discussed, including unusual colored urine or stools, coughing up blood or coffee ground emesis, nose bleeds or serious fall or head trauma.  Discussed signs and symptoms of stroke. The patient should avoid any OTC items containing aspirin or ibuprofen.  Avoid alcohol consumption.   Call if any signs of abnormal bleeding.  Discussed financial obligations and resolved any difficulty in obtaining medication.  Next lab test test in 1 month.

## 2015-12-16 NOTE — Progress Notes (Signed)
]      ROS ] 

## 2015-12-19 ENCOUNTER — Telehealth: Payer: Self-pay | Admitting: Cardiology

## 2015-12-19 NOTE — Telephone Encounter (Signed)
New message    Patient calling prescription was called in for [name] the cost  $ 349.00 wife told pharmacy to cancel .    [facility] need a letter from Dr. Radford Pax to filled prescription.    Wife can come by the office to pick up letter

## 2015-12-19 NOTE — Telephone Encounter (Signed)
Called patient and spoke to his wife. She stated she did not know what letter was needed.  [facility]ed [facility] to clarify (ph: [phone]). Pharmacy rep stated an office note would be sufficient for letter needed for necessity. OV note faxed to [phone] per rep request.  Updated patient's wife. Per request, placed copy of OV note at front desk for pick up. She was grateful for help.

## 2015-12-20 ENCOUNTER — Telehealth: Payer: Self-pay | Admitting: Gastroenterology

## 2015-12-20 ENCOUNTER — Telehealth: Payer: Self-pay | Admitting: Cardiology

## 2015-12-20 NOTE — Telephone Encounter (Signed)
Mrs. Mcgroarty is calling to find out if  Mr. [name] needs to keep his appt because he has not started on the Xarelto as of yet . Please call   Thanks

## 2015-12-20 NOTE — Telephone Encounter (Signed)
Thanks, I had asked him for approval to be able to hold the coumadin, although would change the recommendation on how long he holds it. Please tell him to hold it just 48 hours prior to the banding (highest risk of bleeding is not at the time of banding, but 3 days after), and I will tell him when to resume when I see him for the banding. Can you help schedule him if not yet scheduled? Thanks

## 2015-12-20 NOTE — Telephone Encounter (Signed)
LMTCB

## 2015-12-20 NOTE — Telephone Encounter (Signed)
Called patient back, he states that the [place], cardiologist and PCP have discussed that he should stop his coumadin 5 days prior to hemorrhoid banding and resume it day after procedure. I told him that I was returning his wife's call as she had questions about this. Patient understands directions the team of physicians have given him and has no further questions.

## 2015-12-21 NOTE — Telephone Encounter (Signed)
Spoke to patient's wife, gave her Dr. Doyne Keel recommendation to have [name] hold the coumadin 48 hours prior to the hemorrhoid banding and that he will let him know when to resume taking it after the procedure. Patient's wife repeated instructions back, understands to call if they have questions or concerns. Appointment is scheduled for 10/25.

## 2015-12-22 NOTE — Telephone Encounter (Signed)
error 

## 2015-12-22 NOTE — Telephone Encounter (Signed)
Left message for patient to keep lab appointment for tomorrow and make sure to come FASTING. Left instruction to call if he has further questions.

## 2015-12-23 ENCOUNTER — Other Ambulatory Visit (INDEPENDENT_AMBULATORY_CARE_PROVIDER_SITE_OTHER): Payer: Medicare Other | Admitting: *Deleted

## 2015-12-23 DIAGNOSIS — I6523 Occlusion and stenosis of bilateral carotid arteries: Secondary | ICD-10-CM

## 2015-12-23 DIAGNOSIS — I481 Persistent atrial fibrillation: Secondary | ICD-10-CM

## 2015-12-23 DIAGNOSIS — E785 Hyperlipidemia, unspecified: Secondary | ICD-10-CM

## 2015-12-23 DIAGNOSIS — I350 Nonrheumatic aortic (valve) stenosis: Secondary | ICD-10-CM

## 2015-12-23 DIAGNOSIS — I5032 Chronic diastolic (congestive) heart failure: Secondary | ICD-10-CM

## 2015-12-23 DIAGNOSIS — I4819 Other persistent atrial fibrillation: Secondary | ICD-10-CM

## 2015-12-23 DIAGNOSIS — I10 Essential (primary) hypertension: Secondary | ICD-10-CM

## 2015-12-23 DIAGNOSIS — I2510 Atherosclerotic heart disease of native coronary artery without angina pectoris: Secondary | ICD-10-CM

## 2015-12-23 LAB — BASIC METABOLIC PANEL
BUN: 22 mg/dL (ref 7–25)
CO2: 28 mmol/L (ref 20–31)
Calcium: 9.5 mg/dL (ref 8.6–10.3)
Chloride: 102 mmol/L (ref 98–110)
Creat: 1.8 mg/dL — ABNORMAL HIGH (ref 0.70–1.11)
GLUCOSE: 106 mg/dL — AB (ref 65–99)
POTASSIUM: 5.1 mmol/L (ref 3.5–5.3)
Sodium: 138 mmol/L (ref 135–146)

## 2015-12-23 LAB — LIPID PANEL
CHOLESTEROL: 159 mg/dL (ref 125–200)
HDL: 48 mg/dL (ref >=40)
LDL Cholesterol: 86 mg/dL (ref <130)
Total CHOL/HDL Ratio: 3.3 Ratio (ref <=5.0)
Triglycerides: 126 mg/dL (ref <150)
VLDL: 25 mg/dL (ref <30)

## 2015-12-23 LAB — HEPATIC FUNCTION PANEL
ALK PHOS: 53 U/L (ref 40–115)
ALT: 21 U/L (ref 9–46)
AST: 21 U/L (ref 10–35)
Albumin: 4.1 g/dL (ref 3.6–5.1)
Bilirubin, Direct: 0.2 mg/dL (ref <=0.2)
Indirect Bilirubin: 0.6 mg/dL (ref 0.2–1.2)
TOTAL PROTEIN: 6.6 g/dL (ref 6.1–8.1)
Total Bilirubin: 0.8 mg/dL (ref 0.2–1.2)

## 2015-12-26 ENCOUNTER — Telehealth: Payer: Self-pay | Admitting: Pediatrics

## 2015-12-26 DIAGNOSIS — I25810 Atherosclerosis of coronary artery bypass graft(s) without angina pectoris: Secondary | ICD-10-CM

## 2015-12-26 MED ORDER — APIXABAN 2.5 MG PO TABS: 2.5000 mg | ORAL_TABLET | Freq: Two times a day (BID) | ORAL | 3 refills | Status: DC

## 2015-12-26 MED ORDER — EZETIMIBE 10 MG PO TABS: 10.0000 mg | ORAL_TABLET | Freq: Every day | ORAL | 3 refills | Status: DC

## 2015-12-26 NOTE — Telephone Encounter (Signed)
I spoke to patient in regards to lab results and medication changes. I advised him we will discontinue Xarelto and start Eliquis and Zetia.  He told me to speak to his wife [name]. I advised her of the same.  She requested rx's be printed  And put at front desk so they can take them to the [place] in the morning. Dt Radford Pax is out of the office all week, I had [name], PA-C sign the rx's. I advised her pt should hold Xarelto in the morning and begin Eliquis, will no longer be taking Xarelto. She voiced understanding and agreed with the plan. Rx's placed up front for pick up.

## 2015-12-30 ENCOUNTER — Encounter: Payer: Self-pay | Admitting: Physician Assistant

## 2015-12-30 ENCOUNTER — Ambulatory Visit (INDEPENDENT_AMBULATORY_CARE_PROVIDER_SITE_OTHER): Payer: Medicare Other | Admitting: Physician Assistant

## 2015-12-30 VITALS — BP 132/68 | HR 70 | Ht 69.0 in | Wt 199.8 lb

## 2015-12-30 DIAGNOSIS — I480 Paroxysmal atrial fibrillation: Secondary | ICD-10-CM | POA: Diagnosis not present

## 2015-12-30 DIAGNOSIS — I10 Essential (primary) hypertension: Secondary | ICD-10-CM | POA: Diagnosis not present

## 2015-12-30 DIAGNOSIS — I2510 Atherosclerotic heart disease of native coronary artery without angina pectoris: Secondary | ICD-10-CM

## 2015-12-30 DIAGNOSIS — N183 Chronic kidney disease, stage 3 unspecified: Secondary | ICD-10-CM

## 2015-12-30 DIAGNOSIS — E785 Hyperlipidemia, unspecified: Secondary | ICD-10-CM

## 2015-12-30 DIAGNOSIS — I350 Nonrheumatic aortic (valve) stenosis: Secondary | ICD-10-CM

## 2015-12-30 DIAGNOSIS — I5032 Chronic diastolic (congestive) heart failure: Secondary | ICD-10-CM

## 2015-12-30 DIAGNOSIS — Z9889 Other specified postprocedural states: Secondary | ICD-10-CM

## 2015-12-30 NOTE — Patient Instructions (Addendum)
Medication Instructions:  Your physician recommends that you continue on your current medications as directed. Please refer to the Current Medication list given to you today.  Labwork: Please give our office a call to schedule lab work or have [facility] do lab work 8 weeks after you start Zetia. You will need a fasting lipid and liver panel.  Testing/Procedures: NONE  Follow-Up: Your physician wants you to follow-up in: 4 months with Dr. Radford Pax. You will receive a reminder letter in the mail two months in advance. If you don't receive a letter, please call our office to schedule the follow-up appointment.  Please call and schedule an appointment with coumadin clinic a month after starting Eliquis.  If you need a refill on your cardiac medications before your next appointment, please call your pharmacy.

## 2015-12-30 NOTE — Progress Notes (Signed)
Cardiology Office Note    Date:  12/30/2015  ID:  [name], DOB [birth year], MRN [id number] PCP:  [name], MD  Cardiologist:  Dr. Radford Pax   Chief Complaint: f/u afib  History of Present Illness:  [name] is a 82 y.o. male with history of CAD s/p CABGx1 at time of MV annuloplasty in 2004, TIA, persistent atrial fib, HTN, dyslipidemia, hypothyroidism, mild bilateral carotid artery stenosis (1-39% in 11/2014, f/u due 2018), chronic diastolic CHF (grade 3 DD on echo), mild aortic stenosis by echo 07/2015, CKD stage III who presents back to clinic for review of afib.  When Dr. Radford Pax saw him in May 2017 he was in recurrent atrial fib. His INR was subtherapeutic and he was off his Amio to due concern about potential side effects. His Coumadin was adjusted and after his INR was > 2 for 3 weeks, his amio was restarted.  He was supposed to come back to set up DCCV but never returned. 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 There is also mention of his chart of thyroid abnormalities in the past. At his OV with Dr. Radford Pax on 12/16/15 he was asymptomatic thus the decision was made to continue rate control only. Amiodarone was discontinued and Cardizem was increased. Labs 12/23/15 showed LDL 86, Cr 1.80 (appeared at baseline), K 5.1. Last TSH 06/2015 was normal. Last 2D Echo 07/2015: EF 55-60%, grade 3 DD, mild AS, mild MR, mod LAE, mildly dilated RV.  Per the most recent office notes, he was supposed to switch from Coumadin to Xarelto then upon review that Cr was 1.8 on 10/6 BMET, Dr. Radford Pax recommended to switch to Eliquis. The patient returns with his wife today and they report he actually hasn't changed from Coumadin yet. He sees a cardiologist (Dr. Crista Curb - spelling?) as well as primary care Dr. Oletta Lamas at [facility]. He has upcoming hemorrhoid banding  [facility] and [facility] recommended he not change Coumadin to Eliquis until after the surgery. They are following his INR there - it was 2.3 today by the paperwork he brought in. His wife says they are expecting a call back from his [place] cardiologist on Monday 10/15 regarding further instructions on the Eliquis. He is feeling well today without any symptoms. No chest pain, SOB, palpitations, syncope or bleeding. He exercises at [employer] and felt good doing so. He cannot start Zetia until the [place] fills it, which they anticipate will be next week. He is actually in NSR on his own today.   Past Medical History:  Diagnosis Date  . Adenomatous colon polyp   . Aortic stenosis 07/08/2015  . Aortic stenosis    a. mild by echo 2017.  Marland Kitchen CAD (coronary artery disease)    a. S/P CABG x 1 @ time of MV annuloplasty;  b. 02/2010 ETT: neg for ischemia.  . Chronic diastolic CHF (congestive heart failure) ([place])   . Chronic diastolic heart failure ([place])    a. 01/2014 Echo: EF 50-55%, no rwma, mild AS, mild MR, mildly dil LA. PASP 32 mmHg.  . CKD (chronic kidney disease), stage III   . Current use of long term anticoagulation    a. Coumadin in setting of afib.  . Diverticulosis    Colonoscopy  . Dyslipidemia   . Essential hypertension   . H/O mitral valve repair   . Hemorrhoids   .  Hyperlipidemia   . Hypertension   . Persistent atrial fibrillation (HCC)    a. CHA2DS2VASc = 6 -->chronic coumadin;  b. Prev on amio-->d/c 2/2 hypothyroidism. >> resumed 5/16. c. Notes from 2017 indicate interstitial lung disease by pulm appt, question amio toxicity, also increased liver attenuation. Rate control pursued and amiodarone discontinued.  Marland Kitchen TIA (transient ischemic attack)     Past Surgical History:  Procedure Laterality Date  . CORONARY ARTERY BYPASS GRAFT    . MITRAL VALVE REPAIR      Current Medications: Current Outpatient Prescriptions  Medication Sig Dispense Refill  . Cholecalciferol (VITAMIN  D-3 PO) Take 2,000 Units by mouth daily.    Marland Kitchen diltiazem (CARDIZEM CD) 180 MG 24 hr capsule Take 1 capsule (180 mg total) by mouth daily. 90 capsule 0  . docusate sodium (COLACE) 100 MG capsule Take 100 mg by mouth 2 (two) times daily.    . hydrocortisone (ANUSOL-HC) 2.5 % rectal cream Place 1 application rectally 2 (two) times daily. 30 g 1  . levothyroxine (SYNTHROID, LEVOTHROID) 50 MCG tablet Take 50 mcg by mouth daily before breakfast.    . NATURAL PSYLLIUM FIBER PO Take 1 Dose by mouth daily as needed (constipation).     . pravastatin (PRAVACHOL) 80 MG tablet Take 1 tablet (80 mg total) by mouth daily. 30 tablet 11  . WARFARIN SODIUM PO Take by mouth as directed. Unsure of strength    . apixaban (ELIQUIS) 2.5 MG TABS tablet Take 1 tablet (2.5 mg total) by mouth 2 (two) times daily. (Patient not taking: Reported on 12/30/2015) 180 tablet 3  . ezetimibe (ZETIA) 10 MG tablet Take 1 tablet (10 mg total) by mouth daily. (Patient not taking: Reported on 12/30/2015) 90 tablet 3   No current facility-administered medications for this visit.      Allergies:   Asa [aspirin] and Flomax [tamsulosin]   Social History   Social History  . Marital status: Married    Spouse name: N/A  . Number of children: N/A  . Years of education: N/A   Occupational History  . Truck Geophysicist/field seismologist     Retired    Social History Main Topics  . Smoking status: Former Smoker    Packs/day: 0.10    Years: 2.00    Types: Cigarettes    Quit date: 03/20/1955  . Smokeless tobacco: Never Used  . Alcohol use No  . Drug use: No  . Sexual activity: Not Currently   Other Topics Concern  . None   Social History Narrative  . None     Family History:  The patient's family history includes Heart disease in his father; Heart failure in his brother; Stroke in his brother and mother.   ROS:   Please see the history of present illness.  All other systems are reviewed and otherwise negative.    PHYSICAL EXAM:   VS:  BP  132/68   Pulse 70   Ht 5\' 9"  (1.753 m)   Wt 199 lb 12.8 oz (90.6 kg)   SpO2 98%   BMI 29.51 kg/m   BMI: Body mass index is 29.51 kg/m. GEN: Well nourished, well developed WM, in no acute distress  HEENT: normocephalic, atraumatic Neck: no JVD, carotid bruits, or masses Cardiac: RRR; soft SEM, no murmurs, rubs, or gallops, no edema  Respiratory:  clear to auscultation bilaterally, normal work of breathing GI: soft, nontender, nondistended, + BS MS: no deformity or atrophy  Skin: warm and dry, no rash Neuro:  Alert  and Oriented x 3, Strength and sensation are intact, follows commands Psych: euthymic mood, full affect  Wt Readings from Last 3 Encounters:  12/30/15 199 lb 12.8 oz (90.6 kg)  12/16/15 196 lb 1.9 oz (89 kg)  11/10/15 199 lb (90.3 kg)      Studies/Labs Reviewed:   EKG:  EKG was ordered today and personally reviewed by me and demonstrates NSR 70bpm, 1st degree AV block, occ PACs, NSIVCD  Recent Labs: 07/15/2015: TSH 2.77 12/23/2015: ALT 21; BUN 22; Creat 1.80; Potassium 5.1; Sodium 138   Lipid Panel    Component Value Date/Time   CHOL 159 12/23/2015 0742   TRIG 126 12/23/2015 0742   HDL 48 12/23/2015 0742   CHOLHDL 3.3 12/23/2015 0742   VLDL 25 12/23/2015 0742   LDLCALC 86 12/23/2015 0742    Additional studies/ records that were reviewed today include: Summarized above.    ASSESSMENT & PLAN:   1. Paroxysmal atrial fib - managed with rate control strategy as he is asymptomatic and has had issues with amiodarone. Actually in NSR today. See above regarding blood thinners - at this time, his cardiologist at [facility] has assumed hold of the transition from Coumadin to Eliquis, to begin after his hemorrhoid banding surgery. GI plans to hold Coumadin for 48 hours prior to procedure - will review with Dr. Radford Pax whether any bridging surveillance will be needed around time of procedure given CHADSVASC 7. Continue Cardizem at present dose. He initially had a f/u  scheduled with blood thinner clinic given new Eliquis start for 10/26. This is typically done 4 weeks after initiation of NOAC. Since he has not yet started it, we cancelled this appointment and asked him to let us know when he starts Eliquis - at which time he'll need a pharmD appt in 4 weeks. As a precaution, I reiterated that he should not be on both Coumadin and Eliquis - he and his wife both vehemently agree and understand. 2. CAD - no recent anginal symptoms. Not on ASA due to concomitant anticoagulation.  3. Chronic diastolic CHF - appears euvolemic. 4. Essential HTN - controlled. 5. CKD stage III - further monitoring per primary care. Reviewed recommendation to avoid NSAIDS. 6. H/o mitral valve repair - stable by echo earlier this year. 7. Mild AS - follow clinically for now. 8. Hyperlipidemia - has not yet started Zetia. I asked him to call when he does, at which time we will schedule follow-up liver/lipid function for 8 weeks after as advised by Dr. Radford Pax.   Disposition: F/u with Dr. Radford Pax in 4 months.   Medication Adjustments/Labs and Tests Ordered: Current medicines are reviewed at length with the patient today.  Concerns regarding medicines are outlined above. Medication changes, Labs and Tests ordered today are summarized above and listed in the Patient Instructions accessible in Encounters.   Micheal Ache PA-C  12/30/2015 3:31 PM    [facility] [place], [place], [place]  [address] Phone: [phone]; Fax: [phone]

## 2016-01-03 ENCOUNTER — Telehealth: Payer: Self-pay | Admitting: Physician Assistant

## 2016-01-03 ENCOUNTER — Telehealth: Payer: Self-pay

## 2016-01-03 NOTE — Telephone Encounter (Signed)
Follow Up:; ° ° °Returning your call. °

## 2016-01-03 NOTE — Telephone Encounter (Signed)
He will need to be off for 48 hours prior to the procedure, and then resume it 3-4 days after the procedure. There is increased risk of bleeding which can be significant if he does not hold coumadin. If he is not willing to hold the coumadin then we cannot proceed with banding. In looking through his chart they were thinking of transitioning him to Eliquis, please let me know if they decide this, as this would be held differently than the coumadin. Thanks

## 2016-01-03 NOTE — Telephone Encounter (Signed)
Returned [employer], Dr. Doyne Keel RN @ [facility].  She advised that hemorrhoid banding could be a 1 X thing or a several time thing, it depended upon the pt and the Dr will decide once he sees what he is dealing with.  Gave the information to [place], Pharm-D to look at pt char to see if it is a concern with pt going off Coumadin for days at a time, or what her recommendation will be.

## 2016-01-03 NOTE — Telephone Encounter (Signed)
Claris Gladden, I saw Mr. Annie Sable in clinic on 12/30/15. At that time he reported he had an upcoming hemorrhoid banding surgery scheduled for 01/11/16. Per review of GI note, they had planned to only hold Coumadin for 2 days. This patient has a high CHADSVASC score of 7 including prior history of TIA and MV repair - we usually bridge these people with Lovenox to avoid risk of throwing a clot while off Coumadin. I spoke with Dr. Radford Pax who recommends he actually come into the Coumadin clinic for bridging with IV Lovenox before the procedure. Can you please review with the pharmacist in our clinic when he should come in to discuss this? They usually need to come in 5-7 days before the procedure which is approaching quickly. Please call GI office as well and put in a message to Dr. Havery Moros to let him know we plan to bridge as well, and request his input on when last dose of Lovenox should be given depending on procedure time. Thanks.  Keylin Podolsky PA-C

## 2016-01-03 NOTE — Telephone Encounter (Signed)
Received call from [facility] from [place] and pharmacist, Jinny Blossom. Patient is at very high risk for stroke, concerned about being off his Coumadin for 48 hours prior and following hemorrhoid banding, not sure if a Lovenox bridge is going to best solution. Please see note from today's visit. Jinny Blossom will discuss with the Cardiologist tomorrow, but would like to have input on length of time patient will need to be off Coumadin afterwards. Is this going to be a one time banding procedure? Please advise.

## 2016-01-04 NOTE — Telephone Encounter (Signed)
Pt's wife calling regarding message left yesterday regarding going off Coumadin for days at a time pls call @ [phone]

## 2016-01-04 NOTE — Telephone Encounter (Signed)
That was an old voicemail, I have already addressed this when I spoke with pt and his wife an hour ago. Still in the process of discussing safety of procedure with Dr Radford Pax and told pt's wife I would call them when I had an update.   Most recently spoke with Almyra Free, RN with Dr Doyne Keel office. See other telephone note for details - am clarifying what the difference in timing for resuming anticoagulation would be if pt were on Eliquis instead of warfarin. There should be no difference since pt would need to be bridged with Lovenox if he remains on warfarin and Lovenox and Eliquis have similar pharmacokinetics in time to therapeutic anticoagulation. Patient is at very high cardiovascular risk and ideally cannot be off of his blood thinner for 4-5 days as originally requested.

## 2016-01-05 NOTE — Telephone Encounter (Signed)
  Manus Gunning, MD  Doristine Counter, RN        If using Eliquis, he can take it up until the day of the procedure. Hold it the day of the procedure, and would resume it after 5 days or so. I don't see how a lovenox bridge would be useful. The issue with this is not what we do before the banding, the risk of bleeding is 72 hours after the band is placed. If you would like me to speak to the pharmacist I can do that   Previous Messages    ----- Message -----  From: Doristine Counter, RN  Sent: 01/04/2016  2:38 PM  To: Manus Gunning, MD   If patient starts Eliquis, pharmacist says that they would still bridge with Lovenox, they would only stop Eliquis one day prior. What is the time frame for restarting Eliquis afterwards? If you would like to call the pharmacist directly, it is [place] at [phone].

## 2016-01-05 NOTE — Telephone Encounter (Signed)
Lovenox bridge would be used if patient is on Coumadin because he is high cardiac risk. Does not make a difference to [place] whether or not patient is on Eliquis or warfarin/Lovenox bridge. The issue is pt being off of his blood thinner for 1 day prior and 3-5 days post procedure with a CHADS2 score of 6 and CHADS2VASC score of 8. Will route to Dr Radford Pax to discuss with Dr Havery Moros regarding safety of procedure and risk of patient being off his blood thinner for a total of 4-6 days given his high cardiac risk.

## 2016-01-08 NOTE — Telephone Encounter (Signed)
This patient is at very high risk for recurrent TIA/CVA if he is off anticoagulation > 48 hours.  His CHADS2VASC score is 6 with history of TIA in afib in the past and is in chronic afib at this time.  If hemorrhoid banding is not a necessary procedure then would recommend not proceed with surgery unless patient is willing to accept the risk of stroke being off anticoagulation.  Please forward this to his surgeon.

## 2016-01-09 NOTE — Telephone Encounter (Signed)
Forwarded to Dr. Armbruster. 

## 2016-01-09 NOTE — Telephone Encounter (Signed)
Anderson [place] and [place], can you provide me with an update on this issue? Thanks.

## 2016-01-09 NOTE — Telephone Encounter (Signed)
Please see other telephone encounter from 10/17 for details. Dr Radford Pax recommends against procedure unless it is absolutely necessary given patients extremely elevated cardiac risk.

## 2016-01-10 NOTE — Telephone Encounter (Signed)
Katie from [facility] is calling regarding patient's procedure tomorrow hemorrhoid banding 10/25. Joellen Jersey would like to be called back regarding recomendation [phone].

## 2016-01-10 NOTE — Telephone Encounter (Signed)
See other phone note. I do not see that GI MD has responded to the phone message within his office. I have asked Dr. Theodosia Blender nurse to get in touch with GI to make sure he is in fact aware of this recommendation as this is still listed under appointments for 10/25, and to make sure patient is aware. Dayna Dunn PA-C

## 2016-01-10 NOTE — Telephone Encounter (Signed)
Are we still proceeding with this patient's hemorrhoid banding for tomorrow?

## 2016-01-10 NOTE — Telephone Encounter (Signed)
Spoke with Dr. Havery Moros and his nurse. Confirmed with Dr. Havery Moros that patient is currently on Coumadin, but Eliquis was called in for pricing (the [place] manages his anticoagulation) as he wanted to switch if/after he had the banding done. Dr. Havery Moros states he will not perform the procedure tomorrow (see his note in this encounter) and he is waiting on an update from the patient.  Spoke to Mrs. Harriott, and confirmed procedure is not being done tomorrow. She understands to discuss Coumadin/Eliquis at next Coumadin Clinic appointment and to call after OV if medication changes are made to update med list.

## 2016-01-10 NOTE — Telephone Encounter (Signed)
Called to speak with Dr. Doyne Keel nurse, but she was unavailable at this time.  Spoke to another representative who states she will forward the phone note to Dr. Doyne Keel nurse and have her call back.

## 2016-01-10 NOTE — Telephone Encounter (Signed)
Thanks for the follow up, I'm sorry I did not realize he was scheduled for tomorrow. Per cardiology he cannot hold blood thinner more than 48 hours. In this setting he's at high risk for bleeding from hemorrhoid banding and if he's not anemic or having significant bleeding, I think the risks may outweigh the benefits of banding. If he really wants therapy for hemorrhoids, we can refer him to general surgery to discuss options with him.   I spoke with his wife about this and she agreed. I will keep his appointment to discuss further with him tomorrow to make sure he understands my recommendations / concerns about banding, however wife will call in the morning after he has been able to think about it. I think either way he is going to want a referral to surgery for a second opinion on hemorrhoid therapy. Thanks and sorry for the late response, I was out of the office last week.

## 2016-01-11 ENCOUNTER — Encounter: Payer: Medicare Other | Admitting: Gastroenterology

## 2016-03-15 ENCOUNTER — Telehealth: Payer: Self-pay | Admitting: Cardiology

## 2016-03-15 DIAGNOSIS — I10 Essential (primary) hypertension: Secondary | ICD-10-CM

## 2016-03-15 DIAGNOSIS — I5032 Chronic diastolic (congestive) heart failure: Secondary | ICD-10-CM

## 2016-03-15 DIAGNOSIS — I4819 Other persistent atrial fibrillation: Secondary | ICD-10-CM

## 2016-03-15 DIAGNOSIS — E785 Hyperlipidemia, unspecified: Secondary | ICD-10-CM

## 2016-03-15 DIAGNOSIS — I2510 Atherosclerotic heart disease of native coronary artery without angina pectoris: Secondary | ICD-10-CM

## 2016-03-15 MED ORDER — DILTIAZEM HCL ER COATED BEADS 180 MG PO CP24: 180.0000 mg | ORAL_CAPSULE | Freq: Every day | ORAL | 3 refills | Status: DC

## 2016-03-15 NOTE — Telephone Encounter (Signed)
New Message:    Pt needs a written prescription for his Diltiazem and note also for the [place]. Pt said Dr Radford Pax knows what type of note he needs.Please call when both are ready,he will pick it up.

## 2016-03-15 NOTE — Telephone Encounter (Signed)
Rx printed and Dr. Theodosia Blender office note printed. Both placed at front desk for pick-up. Ms. Lance notified. She was grateful for assistance.

## 2016-03-16 ENCOUNTER — Other Ambulatory Visit: Payer: Medicare Other | Admitting: *Deleted

## 2016-03-16 DIAGNOSIS — I25810 Atherosclerosis of coronary artery bypass graft(s) without angina pectoris: Secondary | ICD-10-CM

## 2016-03-16 LAB — LIPID PANEL
Cholesterol: 119 mg/dL (ref <200)
HDL: 45 mg/dL (ref >40)
LDL Cholesterol: 54 mg/dL (ref <100)
TRIGLYCERIDES: 101 mg/dL (ref <150)
Total CHOL/HDL Ratio: 2.6 Ratio (ref <5.0)
VLDL: 20 mg/dL (ref <30)

## 2016-03-16 LAB — ALT: ALT: 22 U/L (ref 9–46)

## 2016-04-02 ENCOUNTER — Ambulatory Visit (INDEPENDENT_AMBULATORY_CARE_PROVIDER_SITE_OTHER): Payer: PPO | Admitting: Internal Medicine

## 2016-04-02 DIAGNOSIS — R918 Other nonspecific abnormal finding of lung field: Secondary | ICD-10-CM | POA: Diagnosis not present

## 2016-04-02 LAB — PULMONARY FUNCTION TEST
DL/VA % pred: 90 %
DL/VA: 4.08 ml/min/mmHg/L
DLCO COR % PRED: 66 %
DLCO COR: 20.49 ml/min/mmHg
DLCO unc % pred: 68 %
DLCO unc: 21.36 ml/min/mmHg
FEF 25-75 PRE: 2.73 L/s
FEF 25-75 Post: 2.96 L/sec
FEF2575-%Change-Post: 8 %
FEF2575-%PRED-PRE: 153 %
FEF2575-%Pred-Post: 166 %
FEV1-%Change-Post: 3 %
FEV1-%Pred-Post: 97 %
FEV1-%Pred-Pre: 94 %
FEV1-POST: 2.57 L
FEV1-Pre: 2.5 L
FEV1FVC-%Change-Post: 4 %
FEV1FVC-%Pred-Pre: 115 %
FEV6-%CHANGE-POST: -1 %
FEV6-%PRED-POST: 85 %
FEV6-%PRED-PRE: 87 %
FEV6-POST: 2.99 L
FEV6-Pre: 3.05 L
FEV6FVC-%CHANGE-POST: 0 %
FEV6FVC-%PRED-POST: 107 %
FEV6FVC-%Pred-Pre: 107 %
FVC-%Change-Post: -1 %
FVC-%PRED-POST: 79 %
FVC-%PRED-PRE: 80 %
FVC-POST: 2.99 L
FVC-Pre: 3.05 L
POST FEV6/FVC RATIO: 100 %
PRE FEV1/FVC RATIO: 82 %
Post FEV1/FVC ratio: 86 %
Pre FEV6/FVC Ratio: 100 %

## 2016-04-02 NOTE — Addendum Note (Signed)
Addended by: Desmond Dike C on: 04/02/2016 11:24 AM   Modules accepted: Orders

## 2016-04-02 NOTE — Progress Notes (Signed)
PFT performed today. Nitrogen Washout was performed due to the patient being unable to perform Pleth to meet ATS standards.

## 2016-04-05 ENCOUNTER — Ambulatory Visit: Payer: Medicare Other | Admitting: Internal Medicine

## 2016-04-30 ENCOUNTER — Ambulatory Visit: Payer: PPO | Admitting: Cardiology

## 2016-05-01 ENCOUNTER — Encounter: Payer: Self-pay | Admitting: Internal Medicine

## 2016-05-01 ENCOUNTER — Other Ambulatory Visit (INDEPENDENT_AMBULATORY_CARE_PROVIDER_SITE_OTHER): Payer: PPO

## 2016-05-01 ENCOUNTER — Ambulatory Visit (INDEPENDENT_AMBULATORY_CARE_PROVIDER_SITE_OTHER): Payer: PPO | Admitting: Internal Medicine

## 2016-05-01 VITALS — BP 108/60 | HR 96 | Ht 69.0 in | Wt 198.0 lb

## 2016-05-01 DIAGNOSIS — J849 Interstitial pulmonary disease, unspecified: Secondary | ICD-10-CM

## 2016-05-01 DIAGNOSIS — R918 Other nonspecific abnormal finding of lung field: Secondary | ICD-10-CM

## 2016-05-01 LAB — SEDIMENTATION RATE: SED RATE: 12 mm/h (ref 0–20)

## 2016-05-01 LAB — CARDIAC PANEL
CK MB: 1.7 ng/mL (ref 0.3–4.0)
RELATIVE INDEX: 3.1 calc — AB (ref 0.0–2.5)
Total CK: 55 U/L (ref 7–232)

## 2016-05-01 NOTE — Patient Instructions (Addendum)
ICD-9-CM ICD-10-CM   1. ILD (interstitial lung disease) (HCC) 515 J84.9    You feel good but there is concern on breathing test that the above problem might be getting worse between June 2017 -> jan 2018  Unlikely above related to amiodarone  PLAN Do HRCT next few weeks  Do Serum: ESR, ACE, ANA, DS-DNA, RF, anti-CCP, ssA, ssB, scl-70,  Total CK,  RNP, Aldolase,    Followup  - return to see me next few weeks after completing above  

## 2016-05-01 NOTE — Progress Notes (Signed)
Subjective:     Patient ID: [name], male   DOB: 05/29/1931, 83 y.o.   MRN: [id number] PCP HARRIS, WILLIAM, MD   HPI  PCP HARRIS, WILLIAM, MD   HPI   IOV 09/09/2015  Chief Complaint  Patient presents with  . Advice Only    Referred by Dr. Turner for abnormal PFT.  PFT from 08/31/15 in Epic. Pt has no breathing complaints.     83-year-old male referred by Dr. Turner in cardiology for abnormal pulmonary function test. He presents with his wife. At baseline he says he does not have any problems. Only thing was in 2015 he got admitted for weakness according to chart review. At that time he had a CT scan of the chest high resolution that was read by thoracic radiology and the possibility of bilateral bibasal atelectasis versus subtle interstitial lung disease was raised. However he is continued to remain asymptomatic. Sometime a year ago he was started on amiodarone for a few weeks but then most recently a few months ago went back on amiodarone because of atrial fibrillation according to him and chart review and his wife. He subsequently had pulmonary function test Pulmonary function test personally visualized and done on 08/31/2015 was normal except for mild restriction on total lung capacity 5.14 L/74% and slight reduction in DLCO 23.26/75%. Correlating with this is a 2015 CT chest read by thoracic radiologist suggesting possible ILD versus basal atelectasis  Therefore he has been referred here. He is puzzled by the referral because he feels he is asymptomatic. He works out regularly at fitness and feels well.   OV 05/01/2016  Chief Complaint  Patient presents with  . Follow-up    Pt states his SOB is unchanged since last OV. Pt states he is getting over a URI, c/o prod cough with clear mucus. Pt denies SOB and CP/tightness.    Santiago B. Ades seen in the summer of 2017. At that time we did a CT scan of the chest that suggested persistent but stable interstitial lung disease findings  compared to November 2015 but did show amio  Liver depisotis. He was asymptomatic at that time. The pain of starting amiodarone was in the interim. He tells me now that he continues to be asymptomatic. His wife also agrees with him. He works out at at [employer] doing weights and aerobics. Last week he did have a respiratory infection and was down in bed for 1 day but says he is back to baseline. However pulmonary function test documented below shows a decline in forced vital capacity and diffusion capacity. I personally reviewed this result and visualized graph. Walking desaturation test 185 feet 3 laps on room air in our office: lowest pulse ox 98% and asymptomatic HR 121   He is off amio for few months per wife and him  ACCP ILD questionnaire done by his wife on his behlf - cough: mild, occ, day time x months. Denies dyspnea - smoking: started cig at age 20 2 cig/da . Quit after 20 years. REports yes for rec drugs nos - family hx lung disease: denies - home exposure - none - ocupationa: farmm work, truck driver, dairy farm, [employer] - meds: amio hx asbove +    Results for Deland, Avik B. (MRN [id number]) as of 05/01/2016 09:59  Ref. Range 08/31/2015 12:32 04/02/2016 10:33  FVC-Pre Latest Units: L 3.24 3.05  FVC-%Pred-Pre Latest Units: % 85 80  Results for Humm, Finnigan B. (MRN [id number]) as   of 05/01/2016 09:59  Ref. Range 08/31/2015 12:32 04/02/2016 10:33  DLCO cor Latest Units: ml/min/mmHg 23.26 20.49  DLCO cor % pred Latest Units: % 75 66       has a past medical history of Adenomatous colon polyp; Aortic stenosis (07/08/2015); Aortic stenosis; CAD (coronary artery disease); Chronic diastolic CHF (congestive heart failure) ([place]); Chronic diastolic heart failure (HCC); CKD (chronic kidney disease), stage III; Current use of long term anticoagulation; Diverticulosis; Dyslipidemia; Essential hypertension; H/O mitral valve repair; Hemorrhoids; Hyperlipidemia; Hypertension;  Persistent atrial fibrillation ([place]); and TIA (transient ischemic attack).   reports that he quit smoking about 61 years ago. His smoking use included Cigarettes. He has a 0.20 pack-year smoking history. He has never used smokeless tobacco.  Past Surgical History:  Procedure Laterality Date  . CORONARY ARTERY BYPASS GRAFT    . MITRAL VALVE REPAIR      Allergies  Allergen Reactions  . Asa [Aspirin]     Bleeding  . Flomax [Tamsulosin]     Made his heart go out of rhythm    Immunization History  Administered Date(s) Administered  . Influenza Split 02/09/2015  . Influenza, High Dose Seasonal PF 12/18/2015  . Pneumococcal Conjugate-13 03/20/2015    Family History  Problem Relation Age of Onset  . Stroke Mother     Deceased  . Heart disease Father     Deceased  . Heart failure Brother     Deceased  . Stroke Brother      Current Outpatient Prescriptions:  .  Cholecalciferol (VITAMIN D-3 PO), Take 2,000 Units by mouth daily., Disp: , Rfl:  .  diltiazem (CARDIZEM CD) 180 MG 24 hr capsule, Take 1 capsule (180 mg total) by mouth daily., Disp: 90 capsule, Rfl: 3 .  docusate sodium (COLACE) 100 MG capsule, Take 100 mg by mouth 2 (two) times daily., Disp: , Rfl:  .  hydrocortisone (ANUSOL-HC) 2.5 % rectal cream, Place 1 application rectally 2 (two) times daily., Disp: 30 g, Rfl: 1 .  levothyroxine (SYNTHROID, LEVOTHROID) 50 MCG tablet, Take 50 mcg by mouth daily before breakfast., Disp: , Rfl:  .  NATURAL PSYLLIUM FIBER PO, Take 1 Dose by mouth daily as needed (constipation). , Disp: , Rfl:  .  pravastatin (PRAVACHOL) 80 MG tablet, Take 1 tablet (80 mg total) by mouth daily., Disp: 30 tablet, Rfl: 11 .  WARFARIN SODIUM PO, Take by mouth as directed. Unsure of strength, Disp: , Rfl:  .  ezetimibe (ZETIA) 10 MG tablet, Take 1 tablet (10 mg total) by mouth daily. (Patient not taking: Reported on 12/30/2015), Disp: 90 tablet, Rfl: 3   Review of Systems     Objective:   Physical  Exam  Constitutional: He is oriented to person, place, and time. He appears well-developed and well-nourished. No distress.  HENT:  Head: Normocephalic and atraumatic.  Right Ear: External ear normal.  Left Ear: External ear normal.  Mouth/Throat: Oropharynx is clear and moist. No oropharyngeal exudate.  Eyes: Conjunctivae and EOM are normal. Pupils are equal, round, and reactive to light. Right eye exhibits no discharge. Left eye exhibits no discharge. No scleral icterus.  Neck: Normal range of motion. Neck supple. No JVD present. No tracheal deviation present. No thyromegaly present.  Cardiovascular: Normal rate, regular rhythm and intact distal pulses.  Exam reveals no gallop and no friction rub.   No murmur heard. Pulmonary/Chest: Effort normal. No respiratory distress. He has no wheezes. He has rales. He exhibits no tenderness.  Abdominal: Soft. Bowel sounds  are normal. He exhibits no distension and no mass. There is no tenderness. There is no rebound and no guarding.  Visceral obeisty +  Musculoskeletal: Normal range of motion. He exhibits no edema or tenderness.  Lymphadenopathy:    He has no cervical adenopathy.  Neurological: He is alert and oriented to person, place, and time. He has normal reflexes. No cranial nerve deficit. Coordination normal.  Skin: Skin is warm and dry. No rash noted. He is not diaphoretic. No erythema. No pallor.  Psychiatric: He has a normal mood and affect. His behavior is normal. Judgment and thought content normal.  Nursing note and vitals reviewed.   Vitals:   05/01/16 0934  BP: 108/60  Pulse: 96  SpO2: (!) 81%  Weight: 198 lb (89.8 kg)  Height: 5' 9" (1.753 m)    Estimated body mass index is 29.24 kg/m as calculated from the following:   Height as of this encounter: 5' 9" (1.753 m).   Weight as of this encounter: 198 lb (89.8 kg).      Assessment:       ICD-9-CM ICD-10-CM   1. ILD (interstitial lung disease) (HCC) 515 J84.9 CT Chest  High Resolution     Sedimentation rate     Angiotensin converting enzyme     Antinuclear Antib (ANA)     Cyclic Citrul Peptide Antibody, IGG     Anti-scleroderma antibody     RNP Antibody     Aldolase     Cardiac panel     Sjogrens syndrome-A extractable nuclear antibody     Sjogrens syndrome-B extractable nuclear antibody     Anti-DNA antibody, double-stranded     Rheumatoid Factor   Possible IPF    Plan:      You feel good but there is concern on breathing test that the above problem might be getting worse between June 2017 -> jan 2018  Unlikely above related to amiodarone  PLAN Do HRCT next few weeks  Do Serum: ESR, ACE, ANA, DS-DNA, RF, anti-CCP, ssA, ssB, scl-70,  Total CK,  RNP, Aldolase,    Followup  - return to see me next few weeks after completing above  > 50% of this > 25 min visit spent in face to face counseling or coordination of care    Dr. Murali Ramaswamy, M.D., F.C.C.P Pulmonary and Critical Care Medicine Staff Physician [facility] Pager: [phone], If no answer or between  15:00h - 7:00h: call [phone]  05/01/2016 10:56 AM        

## 2016-05-02 LAB — ANTI-DNA ANTIBODY, DOUBLE-STRANDED

## 2016-05-02 LAB — CYCLIC CITRUL PEPTIDE ANTIBODY, IGG
Cyclic Citrullin Peptide Ab: <16 Units
Cyclic Citrullin Peptide Ab: <16 Units

## 2016-05-02 LAB — ANA
Anti Nuclear Antibody(ANA): NEGATIVE
Anti Nuclear Antibody(ANA): NEGATIVE

## 2016-05-02 LAB — RNP ANTIBODY: Ribonucleic Protein(ENA) Antibody, IgG: <1

## 2016-05-02 LAB — ANGIOTENSIN CONVERTING ENZYME: Angiotensin-Converting Enzyme: 35 U/L (ref 9–67)

## 2016-05-02 LAB — SJOGRENS SYNDROME-A EXTRACTABLE NUCLEAR ANTIBODY: SSA (Ro) (ENA) Antibody, IgG: <1

## 2016-05-02 LAB — RHEUMATOID FACTOR: Rhuematoid fact SerPl-aCnc: <14 IU/mL (ref <14)

## 2016-05-02 LAB — ANTI-SCLERODERMA ANTIBODY: Scleroderma (Scl-70) (ENA) Antibody, IgG: <1

## 2016-05-02 LAB — SJOGRENS SYNDROME-B EXTRACTABLE NUCLEAR ANTIBODY: SSB (LA) (ENA) ANTIBODY, IGG: NEGATIVE

## 2016-05-03 ENCOUNTER — Encounter (INDEPENDENT_AMBULATORY_CARE_PROVIDER_SITE_OTHER): Payer: Self-pay

## 2016-05-03 ENCOUNTER — Ambulatory Visit (INDEPENDENT_AMBULATORY_CARE_PROVIDER_SITE_OTHER): Payer: PPO | Admitting: Cardiology

## 2016-05-03 ENCOUNTER — Encounter: Payer: Self-pay | Admitting: Cardiology

## 2016-05-03 ENCOUNTER — Other Ambulatory Visit: Payer: PPO

## 2016-05-03 VITALS — BP 130/64 | HR 80 | Ht 68.0 in | Wt 203.8 lb

## 2016-05-03 DIAGNOSIS — I2510 Atherosclerotic heart disease of native coronary artery without angina pectoris: Secondary | ICD-10-CM

## 2016-05-03 DIAGNOSIS — I5032 Chronic diastolic (congestive) heart failure: Secondary | ICD-10-CM

## 2016-05-03 DIAGNOSIS — I350 Nonrheumatic aortic (valve) stenosis: Secondary | ICD-10-CM

## 2016-05-03 DIAGNOSIS — E7412 Hereditary fructose intolerance: Secondary | ICD-10-CM

## 2016-05-03 DIAGNOSIS — I10 Essential (primary) hypertension: Secondary | ICD-10-CM | POA: Diagnosis not present

## 2016-05-03 DIAGNOSIS — I4819 Other persistent atrial fibrillation: Secondary | ICD-10-CM

## 2016-05-03 DIAGNOSIS — I481 Persistent atrial fibrillation: Secondary | ICD-10-CM | POA: Diagnosis not present

## 2016-05-03 DIAGNOSIS — I6523 Occlusion and stenosis of bilateral carotid arteries: Secondary | ICD-10-CM

## 2016-05-03 DIAGNOSIS — E785 Hyperlipidemia, unspecified: Secondary | ICD-10-CM

## 2016-05-03 LAB — ALDOLASE

## 2016-05-03 NOTE — Patient Instructions (Signed)
Medication Instructions:  Your physician recommends that you continue on your current medications as directed. Please refer to the Current Medication list given to you today.   Labwork: None  Testing/Procedures: Your physician has requested that you have a carotid duplex in August, 2018. This test is an ultrasound of the carotid arteries in your neck. It looks at blood flow through these arteries that supply the brain with blood. Allow one hour for this exam. There are no restrictions or special instructions.   Follow-Up: Your physician wants you to follow-up in: 6 months with Dr. Radford Pax. You will receive a reminder letter in the mail two months in advance. If you don't receive a letter, please call our office to schedule the follow-up appointment.   Any Other Special Instructions Will Be Listed Below (If Applicable).     If you need a refill on your cardiac medications before your next appointment, please call your pharmacy.

## 2016-05-03 NOTE — Progress Notes (Signed)
Cardiology Office Note    Date:  05/03/2016   ID:  [name], DOB 1931-06-09, MRN [id number]  PCP:  [name], MD  Cardiologist:  Fransico Him, MD   Chief Complaint  Patient presents with  . Coronary Artery Disease  . Hypertension  . Atrial Fibrillation  . Hyperlipidemia    History of Present Illness:  [name] is a 83 y.o. male with a history of ASCAD with single vessel CABG, MV annuloplasty, TIA, Persistent atrial fibrillation on coumadin, HTN, chronic diastolic CHF and dyslipidemia who presents today for followup.  His CHADS2VASC score is 6 and he is on anticoagulation. He is doing well today.  He denies any SOB, DOE, chest pain, LE edema. He denies any dizziness or syncope. He goes to the gym several times weekly and exercises with no problems.     Past Medical History:  Diagnosis Date  . Adenomatous colon polyp   . Aortic stenosis    a. mild by echo 2017.  Marland Kitchen CAD (coronary artery disease)    a. S/P CABG x 1 @ time of MV annuloplasty;  b. 02/2010 ETT: neg for ischemia.  . Chronic diastolic CHF (congestive heart failure) ([place])   . CKD (chronic kidney disease), stage III   . Current use of long term anticoagulation    a. Coumadin in setting of afib.  . Diverticulosis    Colonoscopy  . Dyslipidemia   . Essential hypertension   . H/O mitral valve repair   . Hemorrhoids   . Hyperlipidemia   . Hypertension   . Persistent atrial fibrillation (HCC)    a. CHA2DS2VASc = 6 -->chronic coumadin;  b. Prev on amio-->d/c 2/2 hypothyroidism. >> resumed 5/16. c. Notes from 2017 indicate interstitial lung disease by pulm appt, question amio toxicity, also increased liver attenuation. Rate control pursued and amiodarone discontinued.  Marland Kitchen TIA (transient ischemic attack)     Past Surgical History:  Procedure Laterality Date  . CORONARY ARTERY BYPASS GRAFT    . MITRAL VALVE REPAIR      Current Medications: Current Meds  Medication Sig  . Cholecalciferol  (VITAMIN D-3 PO) Take 2,000 Units by mouth daily.  Marland Kitchen diltiazem (CARDIZEM CD) 180 MG 24 hr capsule Take 1 capsule (180 mg total) by mouth daily.  Mar[place] Kitchen docusate sodium (COLACE) 100 MG capsule Take 100 mg by mouth 2 (two) times daily.  . hydrocortisone (ANUSOL-HC) 2.5 % rectal cream Place 1 application rectally 2 (two) times daily.  Mar[place] Kitchen levothyroxine (SYNTHROID, LEVOTHROID) 50 MCG tablet Take 50 mcg by mouth daily before breakfast.  . NATURAL PSYLLIUM FIBER PO Take 1 Dose by mouth daily as needed (constipation).   . pravastatin (PRAVACHOL) 80 MG tablet Take 1 tablet (80 mg total) by mouth daily.  . WARFARIN SODIUM PO Take 2 mg by mouth as directed. Take by mouth as directed by the coumadin clinic    Allergies:   Asa [aspirin] and Flomax [tamsulosin]   Social History   Social History  . Marital status: Married    Spouse name: N/A  . Number of children: N/A  . Years of education: N/A   Occupational History  . Truck Geophysicist/field seismologist     Retired    Social History Main Topics  . Smoking status: Former Smoker    Packs/day: 0.10    Years: 2.00    Types: Cigarettes    Quit date: 03/20/1955  . Smokeless tobacco: Never Used  . Alcohol use No  . Drug use: No  .  Sexual activity: Not Currently   Other Topics Concern  . None   Social History Narrative  . None     Family History:  The patient's family history includes Heart disease in his father; Heart failure in his brother; Stroke in his brother and mother.   ROS:   Please see the history of present illness.    ROS All other systems reviewed and are negative.  No flowsheet data found.     PHYSICAL EXAM:   VS:  BP 130/64   Pulse 80   Ht 5\' 8"  (1.727 m)   Wt 203 lb 12.8 oz (92.4 kg)   BMI 30.99 kg/m    GEN: Well nourished, well developed, in no acute distress  HEENT: normal  Neck: no JVD, carotid bruits, or masses Cardiac: RRR; no murmurs, rubs, or gallops,no edema.  Intact distal pulses bilaterally.  Respiratory:  clear to auscultation  bilaterally, normal work of breathing GI: soft, nontender, nondistended, + BS MS: no deformity or atrophy  Skin: warm and dry, no rash Neuro:  Alert and Oriented x 3, Strength and sensation are intact Psych: euthymic mood, full affect  Wt Readings from Last 3 Encounters:  05/03/16 203 lb 12.8 oz (92.4 kg)  05/01/16 198 lb (89.8 kg)  12/30/15 199 lb 12.8 oz (90.6 kg)      Studies/Labs Reviewed:   EKG:  EKG is not ordered today.    Recent Labs: 07/15/2015: TSH 2.77 12/23/2015: BUN 22; Creat 1.80; Potassium 5.1; Sodium 138 03/16/2016: ALT 22   Lipid Panel    Component Value Date/Time   CHOL 119 03/16/2016 0903   TRIG 101 03/16/2016 0903   HDL 45 03/16/2016 0903   CHOLHDL 2.6 03/16/2016 0903   VLDL 20 03/16/2016 0903   LDLCALC 54 03/16/2016 0903    Additional studies/ records that were reviewed today include:  none    ASSESSMENT:    1. Chronic diastolic heart failure ([place])   2. Essential hypertension   3. Persistent atrial fibrillation ([place])   4. Coronary artery disease involving native coronary artery of native heart without angina pectoris   5. Bilateral carotid artery stenosis   6. Aortic valve stenosis, etiology of cardiac valve disease unspecified   7. Dyslipidemia      PLAN:  In order of problems listed above:  1. Chronic diastolic CHF - he appears euvolemic on exam and weight is stable. Currently on no diuretics 2. HTN - BP controlled on current meds.  Continue Cardizem 3. Persistent atrial fibrillation - he is maintaining NSR.  Continue on CCB and warfarin.   4. ASCAD - 1 vessel s/p CABG.  Continue statin.  No ASA due to warfarin. 5. Bilateral carotid artery stenosis mild (1-39%) by dopplers 11/2014.  Repeat dopplers 10/2016.  Continue statin  No ASA due to warfarin.  6. Aortic stenosis - mild by echo 07/2015 7. Dyslipidemia - LDL goal < 70.  He will continue on statin.  LDL was 54 on 03/16/2016. 8.   Severe MR s/p MV repair which is stable on echo  07/2015    Medication Adjustments/Labs and Tests Ordered: Current medicines are reviewed at length with the patient today.  Concerns regarding medicines are outlined above.  Medication changes, Labs and Tests ordered today are listed in the Patient Instructions below.  There are no Patient Instructions on file for this visit.   Signed, Fransico Him, MD  05/03/2016 1:45 PM    [place],  [place]  [id number] Phone: [phone]; Fax: [phone]

## 2016-05-04 ENCOUNTER — Other Ambulatory Visit: Payer: PPO

## 2016-05-04 DIAGNOSIS — E7412 Hereditary fructose intolerance: Secondary | ICD-10-CM

## 2016-05-04 NOTE — Progress Notes (Signed)
Called patient. Wife answered who stated pt is unavailable at this time. Will try again later.

## 2016-05-07 ENCOUNTER — Other Ambulatory Visit: Payer: Self-pay | Admitting: Internal Medicine

## 2016-05-07 DIAGNOSIS — R531 Weakness: Secondary | ICD-10-CM

## 2016-05-07 LAB — ALDOLASE

## 2016-05-07 NOTE — Progress Notes (Signed)
Pt returned call. He and his wife were informed of results. They did not have any additional questions. Nothing further is needed.

## 2016-05-07 NOTE — Progress Notes (Signed)
Left voicemail for patient to contact office for medical results.

## 2016-05-08 ENCOUNTER — Other Ambulatory Visit: Payer: PPO

## 2016-05-08 DIAGNOSIS — R531 Weakness: Secondary | ICD-10-CM

## 2016-05-10 LAB — ALDOLASE: Aldolase: 3.2 U/L (ref <=8.1)

## 2016-05-15 ENCOUNTER — Ambulatory Visit (INDEPENDENT_AMBULATORY_CARE_PROVIDER_SITE_OTHER)
Admission: RE | Admit: 2016-05-15 | Discharge: 2016-05-15 | Disposition: A | Discharge status: Home / Self Care | Payer: PPO | Source: Ambulatory Visit | Attending: Internal Medicine | Admitting: Internal Medicine

## 2016-05-15 DIAGNOSIS — R0602 Shortness of breath: Secondary | ICD-10-CM | POA: Diagnosis not present

## 2016-05-15 DIAGNOSIS — J849 Interstitial pulmonary disease, unspecified: Secondary | ICD-10-CM

## 2016-05-16 ENCOUNTER — Encounter: Payer: Self-pay | Admitting: Gastroenterology

## 2016-05-25 ENCOUNTER — Encounter: Payer: Self-pay | Admitting: Internal Medicine

## 2016-05-25 ENCOUNTER — Ambulatory Visit (INDEPENDENT_AMBULATORY_CARE_PROVIDER_SITE_OTHER): Payer: PPO | Admitting: Internal Medicine

## 2016-05-25 DIAGNOSIS — J849 Interstitial pulmonary disease, unspecified: Secondary | ICD-10-CM

## 2016-05-25 NOTE — Progress Notes (Signed)
Subjective:     Patient ID: [name], male   DOB: May 21, 1931, 83 y.o.   MRN: [id number]  HPI  PCP [name], MD   HPI   IOV 09/09/2015  Chief Complaint  Patient presents with  . Advice Only    Referred by Dr. Radford Pax for abnormal PFT.  PFT from 08/31/15 in [place]. Pt has no breathing complaints.     83 year old male referred by Dr. Radford Pax in cardiology for abnormal pulmonary function test. He presents with his wife. At baseline he says he does not have any problems. Only thing was in 2015 he got admitted for weakness according to chart review. At that time he had a CT scan of the chest high resolution that was read by thoracic radiology and the possibility of bilateral bibasal atelectasis versus subtle interstitial lung disease was raised. However he is continued to remain asymptomatic. Sometime a year ago he was started on amiodarone for a few weeks but then most recently a few months ago went back on amiodarone because of atrial fibrillation according to him and chart review and his wife. He subsequently had pulmonary function test Pulmonary function test personally visualized and done on 08/31/2015 was normal except for mild restriction on total lung capacity 5.14 L/74% and slight reduction in DLCO 23.26/75%. Correlating with this is a 2015 CT chest read by thoracic radiologist suggesting possible ILD versus basal atelectasis  Therefore he has been referred here. He is puzzled by the referral because he feels he is asymptomatic. He works out regularly at fitness and feels well.   OV 05/01/2016  Chief Complaint  Patient presents with  . Follow-up    Pt states his SOB is unchanged since last OV. Pt states he is getting over a URI, c/o prod cough with clear mucus. Pt denies SOB and CP/tightness.    [name] seen in the summer of 2017. At that time we did a CT scan of the chest that suggested persistent but stable interstitial lung disease findings compared to November 2015  but did show amio  Liver depisotis. He was asymptomatic at that time. The pain of starting amiodarone was in the interim. He tells me now that he continues to be asymptomatic. His wife also agrees with him. He works out at at [employer] doing [employer] and aerobics. Last week he did have a respiratory infection and was down in bed for 1 day but says he is back to baseline. However pulmonary function test documented below shows a decline in forced vital capacity and diffusion capacity. I personally reviewed this result and visualized graph. Walking desaturation test 185 feet 3 laps on room air in our office: lowest pulse ox 98% and asymptomatic HR 121   He is off amio for few months per wife and him  ACCP ILD questionnaire done by his wife on his behlf - cough: mild, occ, day time x months. Denies dyspnea - smoking: started cig at age 68 2 cig/da . Quit after 20 years. REports yes for rec drugs nos - family hx lung disease: denies - home exposure - none - ocupationa: farmm work, Administrator, dairy farm, [employer] - meds: amio hx asbove +   OV 05/25/2016  Chief Complaint  Patient presents with  . Follow-up    pt states he is doing good, he exercises three times a week   Here to discuss ild workup PFT declined sinc June 2017 byut radioloigists feel no change in CT since  June 2017 Autoimmune negative He and wife do not want biopsy though indicated     Results for MIKEY, MAFFETT (MRN [id number]) as of 05/01/2016 09:59  Ref. Range 08/31/2015 12:32 04/02/2016 10:33  FVC-Pre Latest Units: L 3.24 3.05  FVC-%Pred-Pre Latest Units: % 85 80  Results for KODAH, MARET (MRN [id number]) as of 05/01/2016 09:59  Ref. Range 08/31/2015 12:32 04/02/2016 10:33  DLCO cor Latest Units: ml/min/mmHg 23.26 20.49  DLCO cor % pred Latest Units: % 75 66    Results for PRANAY, HILBUN (MRN [id number]) as of 05/25/2016 11:02  Ref. Range 05/01/2016 10:59 05/01/2016 10:59  Sed Rate Latest Ref Range: 0 - 20  mm/hr 12   Anit Nuclear Antibody(ANA) Latest Ref Range: NEGATIVE  NEG NEG  Angiotensin-Converting Enzyme Latest Ref Range: 9 - 67 U/L 35   Cyclic Citrullin Peptide Ab Latest Units: Units <16 <16  ds DNA Ab Latest Units: IU/mL <1   RA Latex Turbid. Latest Ref Range: <14 IU/mL <14   Ribonucleic Protein(ENA) Antibody, IgG Latest Ref Range: <1.0 NEG AI  <1.0 NEG   SSA (Ro) (ENA) Antibody, IgG Latest Ref Range: <1.0 NEG AI  <1.0 NEG   SSB (La) (ENA) Antibody, IgG Latest Ref Range: <1.0 NEG AI  <1.0 NEG   Scleroderma (Scl-70) (ENA) Antibody, IgG Latest Ref Range: <1.0 NEG AI  <1.0 NEG    IMPRESSION: 1. 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 2. Aortic atherosclerosis, in addition to left main and 3 vessel coronary artery disease. Status post median sternotomy for CABG. 3. There are calcifications of the aortic valve. Echocardiographic correlation for evaluation of potential valvular dysfunction may be warranted if clinically indicated. 4. The severity of disease appears essentially Unchanged (compareison June 2017(). Minimal cylindrical traction bronchiectasis is noted in the basal segments of the lower lobes of the lungs bilaterally. A small amount of peripheral bronchiolectasis is also noted. No honeycombing identifi  Electronically Signed   By: Vinnie Langton M.D.   On: 05/15/2016 16:33    Review of Systems     Objective:   Physical Exam  Vitals:   05/25/16 1043  BP: 122/62  Pulse: (!) 117  SpO2: 99%  Weight: 198 lb 9.6 oz (90.1 kg)  Height: 5\' 9"  (1.753 m)    Estimated body mass index is 29.33 kg/m as calculated from the following:   Height as of this encounter: 5\' 9"  (1.753 m).   Weight as of this encounter: 198 lb 9.6 oz (90.1 kg).  discussion only visit      Assessment:       ICD-9-CM ICD-10-CM   1. ILD (interstitial lung disease) ([place]) 515 J84.9        Plan:       ICD-9-CM ICD-10-CM   1. ILD (interstitial lung disease) ([place]) 515 J84.9      ILD (interstitial lung disease) ([place]) Unclear wh you have this problem Not related to amiodarone because you took it for a short while and you had it even before On CT scan not change since 2015  Given fact you are 83 y.o. and onblood thinners agree that will hold off on biopsy But keep in mind best time to biopsy is when you are actually feeling well  This is because these group of diseases can unrpredtictably decline  followup  in 6  months do : Pre-bd spiro and dlco only. No lung volume or bd response.  Return to see me 6 months  (> 50% of this 15 min visit spent in face to face counseling or/and coordination of care)    Dr. Brand Males, M.D., [facility].C.P Pulmonary and Critical Care Medicine Staff Physician [facility] Pager: [phone], If no answer or between  15:00h - 7:00h: call [phone]  05/25/2016 11:19 AM

## 2016-05-25 NOTE — Progress Notes (Signed)
Pt seen today 05/25/16 but MR and results were discussed. Nothing further needed at this time.

## 2016-05-25 NOTE — Patient Instructions (Signed)
ILD (interstitial lung disease) ([place]) Unclear wh you have this problem Not related to amiodarone because you took it for a short while and you had it even before On CT scan not change since 2015  Given fact you are 83 y.o. and onblood thinners agree that will hold off on biopsy But keep in mind best time to biopsy is when you are actually feeling well  This is because these group of diseases can unrpredtictably decline  followup  in 6 months do : Pre-bd spiro and dlco only. No lung volume or bd response.  Return to see me 6 months

## 2016-05-25 NOTE — Assessment & Plan Note (Signed)
Unclear wh you have this problem Not related to amiodarone because you took it for a short while and you had it even before On CT scan not change since 2015  Given fact you are 83 y.o. and onblood thinners agree that will hold off on biopsy But keep in mind best time to biopsy is when you are actually feeling well  This is because these group of diseases can unrpredtictably decline  followup  in 6 months do : Pre-bd spiro and dlco only. No lung volume or bd response.  Return to see me 6 months

## 2016-06-13 ENCOUNTER — Ambulatory Visit: Payer: PPO | Admitting: Cardiology

## 2016-06-25 ENCOUNTER — Telehealth: Payer: Self-pay | Admitting: Cardiology

## 2016-06-25 NOTE — Telephone Encounter (Signed)
Patient's phone still ringing busy

## 2016-06-25 NOTE — Telephone Encounter (Signed)
Tried to call patient- phone ringing busy. Will try again later.

## 2016-06-25 NOTE — Telephone Encounter (Signed)
New message   Transfer called to coumadin clinic- per Tiffany patient is not followed by them.  Patient wife calling wants to know can her husband take any medication for high heart rate.    PCP has not been contacted for bloody stool.

## 2016-06-25 NOTE — Telephone Encounter (Signed)
New message    Patient went to [place] to get blood check.    Heart rate  134.  PT/ INR - 1.7

## 2016-06-26 NOTE — Telephone Encounter (Signed)
New Message    Pt wife calling back , [facility] is concerned [facility] heart beat is 134

## 2016-06-26 NOTE — Telephone Encounter (Signed)
Left message for patient to call back  

## 2016-06-26 NOTE — Telephone Encounter (Signed)
appt made 4/11

## 2016-06-26 NOTE — Telephone Encounter (Signed)
Called patient back. Phone rang busy.

## 2016-06-26 NOTE — Telephone Encounter (Signed)
Patient is calling about his HR being elevated ranging 98 to 134 at rest. Patient has no other symptoms. Consulted DOD, Dr. Tamala Julian, he recommend patient be seen in A. FIB clinic. Will forward to Ulis Rias NP and her nurse to see about scheduling.

## 2016-06-27 ENCOUNTER — Encounter (HOSPITAL_COMMUNITY): Payer: Self-pay | Admitting: Nurse Practitioner

## 2016-06-27 ENCOUNTER — Ambulatory Visit (HOSPITAL_COMMUNITY)
Admission: RE | Admit: 2016-06-27 | Discharge: 2016-06-27 | Disposition: A | Discharge status: Home / Self Care | Payer: PPO | Source: Ambulatory Visit | Attending: Nurse Practitioner | Admitting: Nurse Practitioner

## 2016-06-27 VITALS — BP 136/74 | HR 125 | Ht 69.0 in | Wt 201.4 lb

## 2016-06-27 DIAGNOSIS — E039 Hypothyroidism, unspecified: Secondary | ICD-10-CM | POA: Insufficient documentation

## 2016-06-27 DIAGNOSIS — I130 Hypertensive heart and chronic kidney disease with heart failure and stage 1 through stage 4 chronic kidney disease, or unspecified chronic kidney disease: Secondary | ICD-10-CM | POA: Insufficient documentation

## 2016-06-27 DIAGNOSIS — I350 Nonrheumatic aortic (valve) stenosis: Secondary | ICD-10-CM | POA: Diagnosis not present

## 2016-06-27 DIAGNOSIS — Z87891 Personal history of nicotine dependence: Secondary | ICD-10-CM | POA: Diagnosis not present

## 2016-06-27 DIAGNOSIS — I5032 Chronic diastolic (congestive) heart failure: Secondary | ICD-10-CM | POA: Insufficient documentation

## 2016-06-27 DIAGNOSIS — N183 Chronic kidney disease, stage 3 (moderate): Secondary | ICD-10-CM | POA: Diagnosis not present

## 2016-06-27 DIAGNOSIS — I2510 Atherosclerotic heart disease of native coronary artery without angina pectoris: Secondary | ICD-10-CM | POA: Diagnosis not present

## 2016-06-27 DIAGNOSIS — Z951 Presence of aortocoronary bypass graft: Secondary | ICD-10-CM | POA: Diagnosis not present

## 2016-06-27 DIAGNOSIS — E785 Hyperlipidemia, unspecified: Secondary | ICD-10-CM | POA: Diagnosis not present

## 2016-06-27 DIAGNOSIS — I481 Persistent atrial fibrillation: Secondary | ICD-10-CM | POA: Insufficient documentation

## 2016-06-27 DIAGNOSIS — I4819 Other persistent atrial fibrillation: Secondary | ICD-10-CM

## 2016-06-27 DIAGNOSIS — Z7901 Long term (current) use of anticoagulants: Secondary | ICD-10-CM | POA: Insufficient documentation

## 2016-06-27 DIAGNOSIS — Z8673 Personal history of transient ischemic attack (TIA), and cerebral infarction without residual deficits: Secondary | ICD-10-CM | POA: Insufficient documentation

## 2016-06-27 MED ORDER — DILTIAZEM HCL ER COATED BEADS 240 MG PO CP24: 240.0000 mg | ORAL_CAPSULE | Freq: Every day | ORAL | 0 refills | Status: DC

## 2016-06-27 NOTE — Patient Instructions (Signed)
STOP diltiazem 180 mg  START Diltiazem 240 mg taking once daily  This has been sent to your pharmacy.

## 2016-06-27 NOTE — Progress Notes (Signed)
Primary Care Physician: [name], MD Referring Physician:   Emre Stock is a 83 y.o. male with a h/o CAD s/p  CABG , HTN, h/o mitral valve repair with atrial fibrillation previously on amiodarone but discontinued 2/2 side effects of the drug in spring of 2017. He has noticed that his heart rates have been elevated over 100 for the last month although he denies being symptomatic with afib as far as fatigue, exertional afib or palpitations is concerned. He continues on warfarin checked at the [place].  Today, he denies symptoms of palpitations, chest pain, shortness of breath, orthopnea, PND, lower extremity edema, dizziness, presyncope, syncope, or neurologic sequela. The patient is tolerating medications without difficulties and is otherwise without complaint today.   Past Medical History:  Diagnosis Date  . Adenomatous colon polyp   . Aortic stenosis    a. mild by echo 2017.  Marland Kitchen CAD (coronary artery disease)    a. S/P CABG x 1 @ time of MV annuloplasty;  b. 02/2010 ETT: neg for ischemia.  . Chronic diastolic CHF (congestive heart failure) ([place])   . CKD (chronic kidney disease), stage III   . Current use of long term anticoagulation    a. Coumadin in setting of afib.  . Diverticulosis    Colonoscopy  . Dyslipidemia   . Essential hypertension   . H/O mitral valve repair   . Hemorrhoids   . Hyperlipidemia   . Hypertension   . Persistent atrial fibrillation (HCC)    a. CHA2DS2VASc = 6 -->chronic coumadin;  b. Prev on amio-->d/c 2/2 hypothyroidism. >> resumed 5/16. c. Notes from 2017 indicate interstitial lung disease by pulm appt, question amio toxicity, also increased liver attenuation. Rate control pursued and amiodarone discontinued.  Marland Kitchen TIA (transient ischemic attack)    Past Surgical History:  Procedure Laterality Date  . CORONARY ARTERY BYPASS GRAFT    . MITRAL VALVE REPAIR      Current Outpatient Prescriptions  Medication Sig Dispense Refill  . Cholecalciferol  (VITAMIN D-3 PO) Take 2,000 Units by mouth daily.    Marland Kitchen docusate sodium (COLACE) 100 MG capsule Take 100 mg by mouth 2 (two) times daily.    . hydrocortisone (ANUSOL-HC) 2.5 % rectal cream Place 1 application rectally 2 (two) times daily. 30 g 1  . levothyroxine (SYNTHROID, LEVOTHROID) 50 MCG tablet Take 50 mcg by mouth daily before breakfast.    . NATURAL PSYLLIUM FIBER PO Take 1 Dose by mouth daily as needed (constipation).     . pravastatin (PRAVACHOL) 80 MG tablet Take 1 tablet (80 mg total) by mouth daily. 30 tablet 11  . WARFARIN SODIUM PO Take 2 mg by mouth as directed. Take by mouth as directed by the coumadin clinic    . diltiazem (CARTIA XT) 240 MG 24 hr capsule Take 1 capsule (240 mg total) by mouth daily. 30 capsule 0   No current facility-administered medications for this encounter.     Allergies  Allergen Reactions  . Asa [Aspirin]     Bleeding  . Flomax [Tamsulosin]     Made his heart go out of rhythm    Social History   Social History  . Marital status: Married    Spouse name: N/A  . Number of children: N/A  . Years of education: N/A   Occupational History  . Truck Geophysicist/field seismologist     Retired    Social History Main Topics  . Smoking status: Former Smoker    Packs/day: 0.10  Years: 2.00    Types: Cigarettes    Quit date: 03/20/1955  . Smokeless tobacco: Never Used  . Alcohol use No  . Drug use: No  . Sexual activity: Not Currently   Other Topics Concern  . Not on file   Social History Narrative  . No narrative on file    Family History  Problem Relation Age of Onset  . Stroke Mother     Deceased  . Heart disease Father     Deceased  . Heart failure Brother     Deceased  . Stroke Brother     ROS- All systems are reviewed and negative except as per the HPI above  Physical Exam: Vitals:   06/27/16 0924  BP: 136/74  Pulse: (!) 125  Weight: 201 lb 6.4 oz (91.4 kg)  Height: 5\' 9"  (1.753 m)   Wt Readings from Last 3 Encounters:  06/27/16 201 lb  6.4 oz (91.4 kg)  05/25/16 198 lb 9.6 oz (90.1 kg)  05/03/16 203 lb 12.8 oz (92.4 kg)    Labs: Lab Results  Component Value Date   NA 138 12/23/2015   K 5.1 12/23/2015   CL 102 12/23/2015   CO2 28 12/23/2015   GLUCOSE 106 (H) 12/23/2015   BUN 22 12/23/2015   CREATININE 1.80 (H) 12/23/2015   CALCIUM 9.5 12/23/2015   Lab Results  Component Value Date   INR 2.0 12/16/2015   Lab Results  Component Value Date   CHOL 119 03/16/2016   HDL 45 03/16/2016   LDLCALC 54 03/16/2016   TRIG 101 03/16/2016     GEN- The patient is well appearing, alert and oriented x 3 today.   Head- normocephalic, atraumatic Eyes-  Sclera clear, conjunctiva pink Ears- hearing intact Oropharynx- clear Neck- supple, no JVP Lymph- no cervical lymphadenopathy Lungs- Clear to ausculation bilaterally, normal work of breathing Heart- irregular rate and rhythm, no murmurs, rubs or gallops, PMI not laterally displaced GI- soft, NT, ND, + BS Extremities- no clubbing, cyanosis, or edema MS- no significant deformity or atrophy Skin- no rash or lesion Psych- euthymic mood, full affect Neuro- strength and sensation are intact  EKG- afib at 61 bpm, qrs int 88 ms, qrs int 88 ms, qtc 448 ms Epic records reviewed    Assessment and Plan: 1. Persistent  afib asymptomatic Will aim for rate control Increase cardizem to 240 mg qd Stop 180 mg daily and will send in new rx Continue warfarin for chasvasc score of at least 4, followed thru the [place]  F/u on Monday for reevaluation  Butch Penny C. Simi Briel, [facility] [address] [place], [place] [address] [phone]

## 2016-07-02 ENCOUNTER — Ambulatory Visit (HOSPITAL_COMMUNITY): Payer: PPO | Admitting: Nurse Practitioner

## 2016-07-04 ENCOUNTER — Ambulatory Visit (HOSPITAL_COMMUNITY): Payer: PPO | Admitting: Nurse Practitioner

## 2016-07-04 ENCOUNTER — Encounter (HOSPITAL_COMMUNITY): Payer: Self-pay | Admitting: Nurse Practitioner

## 2016-07-04 ENCOUNTER — Ambulatory Visit (HOSPITAL_COMMUNITY)
Admission: RE | Admit: 2016-07-04 | Discharge: 2016-07-04 | Disposition: A | Discharge status: Home / Self Care | Payer: PPO | Source: Ambulatory Visit | Attending: Nurse Practitioner | Admitting: Nurse Practitioner

## 2016-07-04 VITALS — BP 140/82 | HR 95 | Ht 69.0 in | Wt 203.4 lb

## 2016-07-04 DIAGNOSIS — Z87891 Personal history of nicotine dependence: Secondary | ICD-10-CM | POA: Insufficient documentation

## 2016-07-04 DIAGNOSIS — I481 Persistent atrial fibrillation: Secondary | ICD-10-CM | POA: Insufficient documentation

## 2016-07-04 DIAGNOSIS — E039 Hypothyroidism, unspecified: Secondary | ICD-10-CM | POA: Insufficient documentation

## 2016-07-04 DIAGNOSIS — I2510 Atherosclerotic heart disease of native coronary artery without angina pectoris: Secondary | ICD-10-CM | POA: Insufficient documentation

## 2016-07-04 DIAGNOSIS — Z8673 Personal history of transient ischemic attack (TIA), and cerebral infarction without residual deficits: Secondary | ICD-10-CM | POA: Diagnosis not present

## 2016-07-04 DIAGNOSIS — I350 Nonrheumatic aortic (valve) stenosis: Secondary | ICD-10-CM | POA: Diagnosis not present

## 2016-07-04 DIAGNOSIS — Z7901 Long term (current) use of anticoagulants: Secondary | ICD-10-CM | POA: Diagnosis not present

## 2016-07-04 DIAGNOSIS — E785 Hyperlipidemia, unspecified: Secondary | ICD-10-CM | POA: Diagnosis not present

## 2016-07-04 DIAGNOSIS — N183 Chronic kidney disease, stage 3 (moderate): Secondary | ICD-10-CM | POA: Insufficient documentation

## 2016-07-04 DIAGNOSIS — Z951 Presence of aortocoronary bypass graft: Secondary | ICD-10-CM | POA: Diagnosis not present

## 2016-07-04 DIAGNOSIS — I4819 Other persistent atrial fibrillation: Secondary | ICD-10-CM

## 2016-07-04 DIAGNOSIS — I130 Hypertensive heart and chronic kidney disease with heart failure and stage 1 through stage 4 chronic kidney disease, or unspecified chronic kidney disease: Secondary | ICD-10-CM | POA: Insufficient documentation

## 2016-07-04 DIAGNOSIS — I5032 Chronic diastolic (congestive) heart failure: Secondary | ICD-10-CM | POA: Insufficient documentation

## 2016-07-04 MED ORDER — DILTIAZEM HCL ER COATED BEADS 240 MG PO CP24
240.0000 mg | ORAL_CAPSULE | Freq: Every day | ORAL | 2 refills | Status: DC
Start: 2016-07-04 — End: 2017-02-15

## 2016-07-04 NOTE — Progress Notes (Addendum)
Primary Care Physician: [name], MD Referring Physician:Dr. Kirtan Sada is a 83 y.o. male with a h/o CAD s/p  CABG , HTN, h/o mitral valve repair with persistent atrial fibrillation previously on amiodarone but discontinued 2/2 side effects of the drug in spring of 2017. He has noticed that his heart rates have been elevated over 100 for the last month although he denies being symptomatic with afib as far as fatigue, exertional afib or palpitations is concerned. He continues on warfarin checked at the [place].  He returns to afib clinic, 4/18. He feels well. He shows me his heart rates and BP's at home and he is rate controlled with rates in the 60's-80's with increased dose of cardizem to 240 mg a day.  Today, he denies symptoms of palpitations, chest pain, shortness of breath, orthopnea, PND, lower extremity edema, dizziness, presyncope, syncope, or neurologic sequela. The patient is tolerating medications without difficulties and is otherwise without complaint today.   Past Medical History:  Diagnosis Date  . Adenomatous colon polyp   . Aortic stenosis    a. mild by echo 2017.  Marland Kitchen CAD (coronary artery disease)    a. S/P CABG x 1 @ time of MV annuloplasty;  b. 02/2010 ETT: neg for ischemia.  . Chronic diastolic CHF (congestive heart failure) ([place])   . CKD (chronic kidney disease), stage III   . Current use of long term anticoagulation    a. Coumadin in setting of afib.  . Diverticulosis    Colonoscopy  . Dyslipidemia   . Essential hypertension   . H/O mitral valve repair   . Hemorrhoids   . Hyperlipidemia   . Hypertension   . Persistent atrial fibrillation (HCC)    a. CHA2DS2VASc = 6 -->chronic coumadin;  b. Prev on amio-->d/c 2/2 hypothyroidism. >> resumed 5/16. c. Notes from 2017 indicate interstitial lung disease by pulm appt, question amio toxicity, also increased liver attenuation. Rate control pursued and amiodarone discontinued.  Marland Kitchen TIA (transient ischemic  attack)    Past Surgical History:  Procedure Laterality Date  . CORONARY ARTERY BYPASS GRAFT    . MITRAL VALVE REPAIR      Current Outpatient Prescriptions  Medication Sig Dispense Refill  . Cholecalciferol (VITAMIN D-3 PO) Take 2,000 Units by mouth daily.    Marland Kitchen diltiazem (CARTIA XT) 240 MG 24 hr capsule Take 1 capsule (240 mg total) by mouth daily. 90 capsule 2  . docusate sodium (COLACE) 100 MG capsule Take 100 mg by mouth 2 (two) times daily.    . hydrocortisone (ANUSOL-HC) 2.5 % rectal cream Place 1 application rectally 2 (two) times daily. 30 g 1  . levothyroxine (SYNTHROID, LEVOTHROID) 50 MCG tablet Take 50 mcg by mouth daily before breakfast.    . NATURAL PSYLLIUM FIBER PO Take 1 Dose by mouth daily as needed (constipation).     . pravastatin (PRAVACHOL) 80 MG tablet Take 1 tablet (80 mg total) by mouth daily. 30 tablet 11  . WARFARIN SODIUM PO Take 2 mg by mouth as directed. Take by mouth as directed by the coumadin clinic     No current facility-administered medications for this encounter.     Allergies  Allergen Reactions  . Asa [Aspirin]     Bleeding  . Flomax [Tamsulosin]     Made his heart go out of rhythm    Social History   Social History  . Marital status: Married    Spouse name: N/A  . Number  of children: N/A  . Years of education: N/A   Occupational History  . Truck Geophysicist/field seismologist     Retired    Social History Main Topics  . Smoking status: Former Smoker    Packs/day: 0.10    Years: 2.00    Types: Cigarettes    Quit date: 03/20/1955  . Smokeless tobacco: Never Used  . Alcohol use No  . Drug use: No  . Sexual activity: Not Currently   Other Topics Concern  . Not on file   Social History Narrative  . No narrative on file    Family History  Problem Relation Age of Onset  . Stroke Mother     Deceased  . Heart disease Father     Deceased  . Heart failure Brother     Deceased  . Stroke Brother     ROS- All systems are reviewed and negative  except as per the HPI above  Physical Exam: Vitals:   07/04/16 1047  BP: 140/82  Pulse: 95  Weight: 203 lb 6.4 oz (92.3 kg)  Height: 5\' 9"  (1.753 m)   Wt Readings from Last 3 Encounters:  07/04/16 203 lb 6.4 oz (92.3 kg)  06/27/16 201 lb 6.4 oz (91.4 kg)  05/25/16 198 lb 9.6 oz (90.1 kg)    Labs: Lab Results  Component Value Date   NA 138 12/23/2015   K 5.1 12/23/2015   CL 102 12/23/2015   CO2 28 12/23/2015   GLUCOSE 106 (H) 12/23/2015   BUN 22 12/23/2015   CREATININE 1.80 (H) 12/23/2015   CALCIUM 9.5 12/23/2015   Lab Results  Component Value Date   INR 2.0 12/16/2015   Lab Results  Component Value Date   CHOL 119 03/16/2016   HDL 45 03/16/2016   LDLCALC 54 03/16/2016   TRIG 101 03/16/2016     GEN- The patient is well appearing, alert and oriented x 3 today.   Head- normocephalic, atraumatic Eyes-  Sclera clear, conjunctiva pink Ears- hearing intact Oropharynx- clear Neck- supple, no JVP Lymph- no cervical lymphadenopathy Lungs- Clear to ausculation bilaterally, normal work of breathing Heart- irregular rate and rhythm, no murmurs, rubs or gallops, PMI not laterally displaced GI- soft, NT, ND, + BS Extremities- no clubbing, cyanosis, or edema MS- no significant deformity or atrophy Skin- no rash or lesion Psych- euthymic mood, full affect Neuro- strength and sensation are intact  EKG- afib at 95 bpm, qrs int 114 ms, qtc 402 ms Epic records reviewed    Assessment and Plan: 1. Persistent  afib asymptomatic Is now rate controlled with increase cardizem to 240 mg qd Continue warfarin for chasvasc score of at least 4, followed thru the [place]  F/u with Dr. Radford Pax as scheduled in August afib clinic as needed  Geroge Baseman. Aarini Slee, [facility] [address] [place], [place] [address] [phone]

## 2016-08-08 DIAGNOSIS — I480 Paroxysmal atrial fibrillation: Secondary | ICD-10-CM | POA: Diagnosis not present

## 2016-08-08 DIAGNOSIS — I340 Nonrheumatic mitral (valve) insufficiency: Secondary | ICD-10-CM | POA: Diagnosis not present

## 2016-08-08 DIAGNOSIS — E039 Hypothyroidism, unspecified: Secondary | ICD-10-CM | POA: Diagnosis not present

## 2016-08-08 DIAGNOSIS — E7800 Pure hypercholesterolemia, unspecified: Secondary | ICD-10-CM | POA: Diagnosis not present

## 2016-08-08 DIAGNOSIS — K219 Gastro-esophageal reflux disease without esophagitis: Secondary | ICD-10-CM | POA: Diagnosis not present

## 2016-08-08 DIAGNOSIS — I2510 Atherosclerotic heart disease of native coronary artery without angina pectoris: Secondary | ICD-10-CM | POA: Diagnosis not present

## 2016-08-08 DIAGNOSIS — I10 Essential (primary) hypertension: Secondary | ICD-10-CM | POA: Diagnosis not present

## 2016-08-08 DIAGNOSIS — I350 Nonrheumatic aortic (valve) stenosis: Secondary | ICD-10-CM | POA: Diagnosis not present

## 2016-08-08 DIAGNOSIS — I5030 Unspecified diastolic (congestive) heart failure: Secondary | ICD-10-CM | POA: Diagnosis not present

## 2016-08-08 DIAGNOSIS — N183 Chronic kidney disease, stage 3 (moderate): Secondary | ICD-10-CM | POA: Diagnosis not present

## 2016-08-30 ENCOUNTER — Encounter: Payer: Self-pay | Admitting: *Deleted

## 2016-08-30 ENCOUNTER — Other Ambulatory Visit: Payer: Self-pay | Admitting: *Deleted

## 2016-08-30 NOTE — Patient Outreach (Signed)
HTA THN Screening completed. Pt sees primary care MD routinely. Has cardiologist and visits 2 times a year. Takes medications as ordered for chronic problems. Goes to [employer] 3 times a week.  No acute health concerns. Discussed HEART FAILURE MANAGEMENT AND ACTION PLAN. Will send HF Action Plan for reference. No care management needs. Introductory letter sent.  [name]. Micheal Neither, MSN, [facility] Gerontological Nurse Practitioner [facility] Care Management [phone]

## 2016-10-31 ENCOUNTER — Encounter (HOSPITAL_COMMUNITY): Payer: PPO

## 2016-11-07 ENCOUNTER — Ambulatory Visit (HOSPITAL_COMMUNITY)
Admission: RE | Admit: 2016-11-07 | Discharge: 2016-11-07 | Disposition: A | Discharge status: Home / Self Care | Payer: PPO | Source: Ambulatory Visit | Attending: Internal Medicine | Admitting: Internal Medicine

## 2016-11-07 ENCOUNTER — Encounter: Payer: Self-pay | Admitting: Cardiology

## 2016-11-07 DIAGNOSIS — Z8673 Personal history of transient ischemic attack (TIA), and cerebral infarction without residual deficits: Secondary | ICD-10-CM | POA: Insufficient documentation

## 2016-11-07 DIAGNOSIS — I2510 Atherosclerotic heart disease of native coronary artery without angina pectoris: Secondary | ICD-10-CM | POA: Insufficient documentation

## 2016-11-07 DIAGNOSIS — E785 Hyperlipidemia, unspecified: Secondary | ICD-10-CM | POA: Insufficient documentation

## 2016-11-07 DIAGNOSIS — I6529 Occlusion and stenosis of unspecified carotid artery: Secondary | ICD-10-CM

## 2016-11-07 DIAGNOSIS — Z951 Presence of aortocoronary bypass graft: Secondary | ICD-10-CM | POA: Diagnosis not present

## 2016-11-07 DIAGNOSIS — I10 Essential (primary) hypertension: Secondary | ICD-10-CM | POA: Insufficient documentation

## 2016-11-07 DIAGNOSIS — I6523 Occlusion and stenosis of bilateral carotid arteries: Secondary | ICD-10-CM | POA: Insufficient documentation

## 2016-11-21 ENCOUNTER — Encounter: Payer: Self-pay | Admitting: Internal Medicine

## 2016-11-21 ENCOUNTER — Ambulatory Visit (INDEPENDENT_AMBULATORY_CARE_PROVIDER_SITE_OTHER): Payer: PPO | Admitting: Internal Medicine

## 2016-11-21 VITALS — BP 120/72 | HR 61 | Ht 69.0 in | Wt 200.0 lb

## 2016-11-21 DIAGNOSIS — J849 Interstitial pulmonary disease, unspecified: Secondary | ICD-10-CM

## 2016-11-21 LAB — PULMONARY FUNCTION TEST
DL/VA % pred: 103 %
DL/VA: 4.67 ml/min/mmHg/L
DLCO COR % PRED: 75 %
DLCO COR: 23.33 ml/min/mmHg
DLCO UNC % PRED: 78 %
DLCO UNC: 24.38 ml/min/mmHg
FEF 25-75 Pre: 2.4 L/sec
FEF2575-%Pred-Pre: 136 %
FEV1-%Pred-Pre: 94 %
FEV1-Pre: 2.49 L
FEV1FVC-%PRED-PRE: 113 %
FEV6-%Pred-Pre: 88 %
FEV6-Pre: 3.08 L
FEV6FVC-%Pred-Pre: 107 %
FVC-%Pred-Pre: 82 %
FVC-Pre: 3.08 L
PRE FEV1/FVC RATIO: 81 %
Pre FEV6/FVC Ratio: 100 %

## 2016-11-21 NOTE — Patient Instructions (Addendum)
ICD-10-CM   1. ILD (interstitial lung disease) ([place]) J84.9    Stable undifferentiated ILD  Plan - continue exercise therapy and basic supportive care as discussed - agree to hold off lung biopsy -I recommended 6 month folloowup but support your decision for 1 year followup -High dose flu shot at [facility] in 1 year - In 1 year do Pre-bd spiro and dlco only. No lung volume or bd response. No post-bd spiro   Followup 1 year affter HRCT and PFT tests  Sooner ROv if needed

## 2016-11-21 NOTE — Addendum Note (Signed)
Addended by: Della Goo C on: 11/21/2016 11:24 AM   Modules accepted: Orders

## 2016-11-21 NOTE — Progress Notes (Signed)
PFT done today. 

## 2016-11-21 NOTE — Progress Notes (Signed)
Subjective:     Patient ID: [name], male   DOB: 06/17/1931, 84 y.o.   MRN: [id number]  HPI    PCP [name], MD   HPI   IOV 09/09/2015  Chief Complaint  Patient presents with  . Advice Only    Referred by Dr. Radford Pax for abnormal PFT.  PFT from 08/31/15 in [place]. Pt has no breathing complaints.     83 year old male referred by Dr. Radford Pax in cardiology for abnormal pulmonary function test. He presents with his wife. At baseline he says he does not have any problems. Only thing was in 2015 he got admitted for weakness according to chart review. At that time he had a CT scan of the chest high resolution that was read by thoracic radiology and the possibility of bilateral bibasal atelectasis versus subtle interstitial lung disease was raised. However he is continued to remain asymptomatic. Sometime a year ago he was started on amiodarone for a few weeks but then most recently a few months ago went back on amiodarone because of atrial fibrillation according to him and chart review and his wife. He subsequently had pulmonary function test Pulmonary function test personally visualized and done on 08/31/2015 was normal except for mild restriction on total lung capacity 5.14 L/74% and slight reduction in DLCO 23.26/75%. Correlating with this is a 2015 CT chest read by thoracic radiologist suggesting possible ILD versus basal atelectasis  Therefore he has been referred here. He is puzzled by the referral because he feels he is asymptomatic. He works out regularly at fitness and feels well.   OV 05/01/2016  Chief Complaint  Patient presents with  . Follow-up    Pt states his SOB is unchanged since last OV. Pt states he is getting over a URI, c/o prod cough with clear mucus. Pt denies SOB and CP/tightness.    [name] seen in the summer of 2017. At that time we did a CT scan of the chest that suggested persistent but stable interstitial lung disease findings compared to November  2015 but did show amio  Liver depisotis. He was asymptomatic at that time. The pain of starting amiodarone was in the interim. He tells me now that he continues to be asymptomatic. His wife also agrees with him. He works out at at [employer] doing [employer] and aerobics. Last week he did have a respiratory infection and was down in bed for 1 day but says he is back to baseline. However pulmonary function test documented below shows a decline in forced vital capacity and diffusion capacity. I personally reviewed this result and visualized graph. Walking desaturation test 185 feet 3 laps on room air in our office: lowest pulse ox 98% and asymptomatic HR 121   He is off amio for few months per wife and him  ACCP ILD questionnaire done by his wife on his behlf - cough: mild, occ, day time x months. Denies dyspnea - smoking: started cig at age 66 2 cig/da . Quit after 20 years. REports yes for rec drugs nos - family hx lung disease: denies - home exposure - none - ocupationa: farmm work, Administrator, dairy farm, [employer] - meds: amio hx asbove +   OV 05/25/2016  Chief Complaint  Patient presents with  . Follow-up    pt states he is doing good, he exercises three times a week   Here to discuss ild workup PFT declined sinc June 2017 byut radioloigists feel no change in  CT since June 2017 Autoimmune negative He and wife do not want biopsy though indicated    Results for KOKI, BUXTON (MRN [id number]) as of 05/25/2016 11:02  Ref. Range 05/01/2016 10:59 05/01/2016 10:59  Sed Rate Latest Ref Range: 0 - 20 mm/hr 12   Anit Nuclear Antibody(ANA) Latest Ref Range: NEGATIVE  NEG NEG  Angiotensin-Converting Enzyme Latest Ref Range: 9 - 67 U/L 35   Cyclic Citrullin Peptide Ab Latest Units: Units <16 <16  ds DNA Ab Latest Units: IU/mL <1   RA Latex Turbid. Latest Ref Range: <14 IU/mL <14   Ribonucleic Protein(ENA) Antibody, IgG Latest Ref Range: <1.0 NEG AI  <1.0 NEG   SSA (Ro) (ENA)  Antibody, IgG Latest Ref Range: <1.0 NEG AI  <1.0 NEG   SSB (La) (ENA) Antibody, IgG Latest Ref Range: <1.0 NEG AI  <1.0 NEG   Scleroderma (Scl-70) (ENA) Antibody, IgG Latest Ref Range: <1.0 NEG AI  <1.0 NEG    IMPRESSION: 1. 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 2. Aortic atherosclerosis, in addition to left main and 3 vessel coronary artery disease. Status post median sternotomy for CABG. 3. There are calcifications of the aortic valve. Echocardiographic correlation for evaluation of potential valvular dysfunction may be warranted if clinically indicated. 4. The severity of disease appears essentially Unchanged (compareison June 2017(). Minimal cylindrical traction bronchiectasis is noted in the basal segments of the lower lobes of the lungs bilaterally. A small amount of peripheral bronchiolectasis is also noted. No honeycombing identifi  Electronically Signed   By: Vinnie Langton M.D.   On: 05/15/2016 16:33     OV 11/21/2016  Chief Complaint  Patient presents with  . Follow-up    Pt here after PFT. Pt states his breathing is unchanged since last OV. Pt denies cough, SOB, CP/tightness.    83 year old male with undifferentiated interstitial lung disease stable since 2015. Clinically deemed unrelated to amiodarone taken briefly 2017  Last visit was in March 2018. With his wife. He presents again with his wife. In the interim he continues to exercise daily at [employer]. He lifts weights. In fact is able to lift more weights than before. He does not have any dyspnea of exertion. He does not want to have lung biopsy. He is on basic supportive care with observation therapy. He again declined lung biopsy. In fact T-cell immunity does not want to do a 6 month follow-up and  wants to keep an open-ended follow-up. His pulmonary function test today stable. Walking desaturation test resting pulse ox 96% final pulse ox 97% after 185 feet 3 laps on room air. His heart rate jumped from 91/m 130/m.     Results for JRUE, JARRIEL (MRN [id number]) as of 11/21/2016 10:57  Ref. Range 08/31/2015 12:32 04/02/2016 10:33 11/21/2016 09:53  FVC-Pre Latest Units: L 3.24 3.05 3.08  FVC-%Pred-Pre Latest Units: % 85 80 82  Results for HENRICK, MCGUE (MRN [id number]) as of 11/21/2016 10:57  Ref. Range 08/31/2015 12:32 04/02/2016 10:33 11/21/2016 09:53  DLCO cor Latest Units: ml/min/mmHg 23.26 20.49 23.33  DLCO cor % pred Latest Units: % 75 66 75     has a past medical history of Adenomatous colon polyp; Aortic stenosis; CAD (coronary artery disease); Carotid artery stenosis; Chronic diastolic CHF (congestive heart failure) ([place]); CKD (chronic kidney disease), stage III; Current  use of long term anticoagulation; Diverticulosis; Dyslipidemia; Essential hypertension; H/O mitral valve repair; Hemorrhoids; Hyperlipidemia; Hypertension; Persistent atrial fibrillation ([place]); and TIA (transient ischemic attack).   reports that he quit smoking about 61 years ago. His smoking use included Cigarettes. He has a 0.20 pack-year smoking history. He has never used smokeless tobacco.  Past Surgical History:  Procedure Laterality Date  . CORONARY ARTERY BYPASS GRAFT    . MITRAL VALVE REPAIR      Allergies  Allergen Reactions  . Asa [Aspirin]     Bleeding  . Flomax [Tamsulosin]     Made his heart go out of rhythm    Immunization History  Administered Date(s) Administered  . Influenza Split 02/09/2015  . Influenza, High Dose Seasonal PF 12/18/2015  . Pneumococcal Conjugate-13 03/20/2015    Family History  Problem Relation Age of Onset  . Stroke Mother        Deceased  . Heart disease Father        Deceased  . Heart failure Brother        Deceased  . Stroke Brother      Current Outpatient  Prescriptions:  .  Cholecalciferol (VITAMIN D-3 PO), Take 5,000 Units by mouth daily. , Disp: , Rfl:  .  diltiazem (CARTIA XT) 240 MG 24 hr capsule, Take 1 capsule (240 mg total) by mouth daily., Disp: 90 capsule, Rfl: 2 .  docusate sodium (COLACE) 100 MG capsule, Take 100 mg by mouth 2 (two) times daily., Disp: , Rfl:  .  hydrocortisone (ANUSOL-HC) 2.5 % rectal cream, Place 1 application rectally 2 (two) times daily., Disp: 30 g, Rfl: 1 .  levothyroxine (SYNTHROID, LEVOTHROID) 50 MCG tablet, Take 50 mcg by mouth daily before breakfast., Disp: , Rfl:  .  NATURAL PSYLLIUM FIBER PO, Take 1 Dose by mouth daily as needed (constipation). , Disp: , Rfl:  .  WARFARIN SODIUM PO, Take 2 mg by mouth as directed. Take by mouth as directed by the coumadin clinic, Disp: , Rfl:     Review of Systems     Objective:   Physical Exam  Constitutional: He is oriented to person, place, and time. He appears well-developed and well-nourished. No distress.  HENT:  Head: Normocephalic and atraumatic.  Right Ear: External ear normal.  Left Ear: External ear normal.  Mouth/Throat: Oropharynx is clear and moist. No oropharyngeal exudate.  Eyes: Pupils are equal, round, and reactive to light. Conjunctivae and EOM are normal. Right eye exhibits no discharge. Left eye exhibits no discharge. No scleral icterus.  Neck: Normal range of motion. Neck supple. No JVD present. No tracheal deviation present. No thyromegaly present.  Cardiovascular: Normal rate, regular rhythm and intact distal pulses.  Exam reveals no gallop and no friction rub.   No murmur heard. Pulmonary/Chest: Effort normal. No respiratory distress. He has no wheezes. He has rales. He exhibits no tenderness.  Abdominal: Soft. Bowel sounds are normal. He exhibits no distension and no mass. There is no tenderness. There is no rebound and no guarding.  Musculoskeletal: Normal range of motion. He exhibits no edema or tenderness.  Lymphadenopathy:    He has no  cervical adenopathy.  Neurological: He is alert and oriented to person, place, and time. He has normal reflexes. No cranial nerve deficit. Coordination normal.  Skin: Skin is warm and dry. No rash noted. He is not diaphoretic. No erythema. No pallor.  Psychiatric: He has a normal mood and affect. His behavior is normal. Judgment and thought content normal.  Nursing note and vitals reviewed.  Vitals:   11/21/16 1044  BP: 120/72  Pulse: 61  SpO2: 98%  Weight: 200 lb (90.7 kg)  Height: 5\' 9"  (1.753 m)    Estimated body mass index is 29.53 kg/m as calculated from the following:   Height as of this encounter: 5\' 9"  (1.753 m).   Weight as of this encounter: 200 lb (90.7 kg).     Assessment:       ICD-10-CM   1. ILD (interstitial lung disease) ([place]) J84.9        Plan:      Stable undifferentiated ILD  Plan - continue exercise therapy and basic supportive care as discussed - agree to hold off lung biopsy -I recommended 6 month folloowup but support your decision for 1 year followup -High dose flu shot at [place] in 1 year - In 1 year do Pre-bd spiro and dlco only. No lung volume or bd response. No post-bd spiro   Followup 1 year affter HRCT and PFT tests  Sooner ROv if needed   Dr. Brand Males, M.D., [facility].C.P Pulmonary and Critical Care Medicine Staff Physician [facility] Pager: [phone], If no answer or between  15:00h - 7:00h: call [phone]  11/21/2016 11:08 AM

## 2017-02-11 ENCOUNTER — Encounter: Payer: Self-pay | Admitting: Cardiology

## 2017-02-11 DIAGNOSIS — I2510 Atherosclerotic heart disease of native coronary artery without angina pectoris: Secondary | ICD-10-CM | POA: Diagnosis not present

## 2017-02-11 DIAGNOSIS — I10 Essential (primary) hypertension: Secondary | ICD-10-CM | POA: Diagnosis not present

## 2017-02-11 DIAGNOSIS — I350 Nonrheumatic aortic (valve) stenosis: Secondary | ICD-10-CM | POA: Diagnosis not present

## 2017-02-11 DIAGNOSIS — I480 Paroxysmal atrial fibrillation: Secondary | ICD-10-CM | POA: Diagnosis not present

## 2017-02-11 DIAGNOSIS — N183 Chronic kidney disease, stage 3 (moderate): Secondary | ICD-10-CM | POA: Diagnosis not present

## 2017-02-11 DIAGNOSIS — E039 Hypothyroidism, unspecified: Secondary | ICD-10-CM | POA: Diagnosis not present

## 2017-02-11 DIAGNOSIS — Z1389 Encounter for screening for other disorder: Secondary | ICD-10-CM | POA: Diagnosis not present

## 2017-02-11 DIAGNOSIS — E7800 Pure hypercholesterolemia, unspecified: Secondary | ICD-10-CM | POA: Diagnosis not present

## 2017-02-15 ENCOUNTER — Encounter: Payer: Self-pay | Admitting: Cardiology

## 2017-02-15 ENCOUNTER — Ambulatory Visit: Payer: PPO | Admitting: Cardiology

## 2017-02-15 VITALS — BP 128/66 | HR 82 | Resp 16 | Ht 68.0 in | Wt 199.8 lb

## 2017-02-15 DIAGNOSIS — I4819 Other persistent atrial fibrillation: Secondary | ICD-10-CM

## 2017-02-15 DIAGNOSIS — I481 Persistent atrial fibrillation: Secondary | ICD-10-CM | POA: Diagnosis not present

## 2017-02-15 DIAGNOSIS — I959 Hypotension, unspecified: Secondary | ICD-10-CM

## 2017-02-15 MED ORDER — DILTIAZEM HCL ER COATED BEADS 120 MG PO CP24: 120.0000 mg | ORAL_CAPSULE | Freq: Every day | ORAL | 3 refills | Status: DC

## 2017-02-15 NOTE — Progress Notes (Signed)
02/15/2017 [name]   [birth year]  [id number]  Primary Physician [name], MD Primary Cardiologist: Dr. Radford Pax   Reason for Visit/CC: f/u for atrial fibrillation   HPI:  [name] is a 83 y.o. male who is being seen today for medication management for atrial fibrillation. He has h/o persistent atrial fibrillation. He was previously on amiodarone but this was discontinued due to side effects. Since then, he has been managed with rate control therapy with Cardizem. He is on Coumadin for a/c. His INRs are followed at the [facility]. He also has a h/o CAD s/p CABG as well as h/o mitral valve repair, HTN and diastolic dysfunction.   Pt presents to clinic today for medication management for afib. He was previous prescribed Cardizem 240 mg, however he notes that he self discontinued this 2 months ago due to hypotension. His systolic BP would often drop in the 80s on this dose. He is on no other antihypertensives. His BP has been better, however he has had increasing rates. He notes his resting HR at home can spike in the 110s. He saw his PCP at [facility] and they recommended that he f/u w/ [place].   EKG today shows atrial fibrillation w/ CVR 89 bpm. BP is 128/66. He is asymptomatic with his afib.   Current Meds  Medication Sig  . Cholecalciferol (VITAMIN D-3 PO) Take 5,000 Units by mouth daily.   Marland Kitchen docusate sodium (COLACE) 100 MG capsule Take 100 mg by mouth 2 (two) times daily.  Marland Kitchen levothyroxine (SYNTHROID, LEVOTHROID) 50 MCG tablet Take 50 mcg by mouth daily before breakfast.  . NATURAL PSYLLIUM FIBER PO Take 1 Dose by mouth daily as needed (constipation).   . WARFARIN SODIUM PO Take 2 mg by mouth as directed. Take by mouth as directed by the coumadin clinic  . [DISCONTINUED] diltiazem (CARTIA XT) 240 MG 24 hr capsule Take 1 capsule (240 mg total) by mouth daily.   Allergies  Allergen Reactions  . Asa [Aspirin]     Bleeding  . Flomax [Tamsulosin]     Made his heart go  out of rhythm   Past Medical History:  Diagnosis Date  . Adenomatous colon polyp   . Aortic stenosis    a. mild by echo 2017.  Marland Kitchen CAD (coronary artery disease)    a. S/P CABG x 1 @ time of MV annuloplasty;  b. 02/2010 ETT: neg for ischemia.  . Carotid artery stenosis    1-39% by dopplers 10/2016  . Chronic diastolic CHF (congestive heart failure) ([place])   . CKD (chronic kidney disease), stage III ([place])   . Current use of long term anticoagulation    a. Coumadin in setting of afib.  . Diverticulosis    Colonoscopy  . Dyslipidemia   . Essential hypertension   . H/O mitral valve repair   . Hemorrhoids   . Hyperlipidemia   . Hypertension   . Persistent atrial fibrillation ([facility])    a. CHA2DS2VASc = 6 -->chronic coumadin;  b. Prev on amio-->d/c 2/2 hypothyroidism. >> resumed 5/16. c. Notes from 2017 indicate interstitial lung disease by pulm appt, question amio toxicity, also increased liver attenuation. Rate control pursued and amiodarone discontinued.  Marland Kitchen TIA (transient ischemic attack)    Family History  Problem Relation Age of Onset  . Stroke Mother        Deceased  . Heart disease Father        Deceased  . Heart failure Brother  Deceased  . Stroke Brother    Past Surgical History:  Procedure Laterality Date  . CORONARY ARTERY BYPASS GRAFT    . MITRAL VALVE REPAIR     Social History   Socioeconomic History  . Marital status: Married    Spouse name: Not on file  . Number of children: Not on file  . Years of education: Not on file  . Highest education level: Not on file  Social Needs  . Financial resource strain: Not on file  . Food insecurity - worry: Not on file  . Food insecurity - inability: Not on file  . Transportation needs - medical: Not on file  . Transportation needs - non-medical: Not on file  Occupational History  . 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 Smoking status: Former Smoker    Packs/day: 0.10    Years: 2.00     Pack years: 0.20    Types: Cigarettes    Last attempt to quit: 03/20/1955    Years since quitting: 61.9  . Smokeless tobacco: Never Used  Substance and Sexual Activity  . Alcohol use: No    Alcohol/week: 0.0 oz  . Drug use: No  . Sexual activity: Not Currently  Other Topics Concern  . Not on file  Social History Narrative  . Not on file     Review of Systems: General: negative for chills, fever, night sweats or weight changes.  Cardiovascular: negative for chest pain, dyspnea on exertion, edema, orthopnea, palpitations, paroxysmal nocturnal dyspnea or shortness of breath Dermatological: negative for rash Respiratory: negative for cough or wheezing Urologic: negative for hematuria Abdominal: negative for nausea, vomiting, diarrhea, bright red blood per rectum, melena, or hematemesis Neurologic: negative for visual changes, syncope, or dizziness All other systems reviewed and are otherwise negative except as noted above.   Physical Exam:  Blood pressure 128/66, pulse 82, resp. rate 16, height 5\' 8"  (1.727 m), weight 199 lb 12.8 oz (90.6 kg), SpO2 96 %.  General appearance: alert, cooperative and no distress Neck: no carotid bruit and no JVD Lungs: clear to auscultation bilaterally Heart: irregularly irregular rhythm and 1/6 murmur Extremities: extremities normal, atraumatic, no cyanosis or edema Pulses: 2+ and symmetric Skin: Skin color, texture, turgor normal. No rashes or lesions Neurologic: Grossly normal  EKG atrial fibrillation 89 bpm -- personally reviewed   ASSESSMENT AND PLAN:   1. Persistent Atrial Fibrillation: amiodarone previously discontinued due to side effects. Plan has been for rate control strategy. Asymptomatic. Does not tolerate high dose Cardizem, 240 mg, due to issues with hypotension. He self discontinued Cardizem 2 months ago and subsequently has had increased resting HRs. We will restart Cardizem but at a lower dose of 120 mg daily. He will monitor HR and  BP at home and will call if any issues tolerating. Continue coumadin for a/c. INRs are followed by the [facility].   2. CAD: h/o CABG. Stable w/o symptoms. Continue medical therapy.   3. H/o Mitral Valve Repair: asymptomatic. No dyspnea or chest pain.   4. HTN: stable. Adding back Cardizem given increasing resting HRs w/ afib. He will monitor closely at home.   5. Chronic Diastolic HF: volume is stable. No edema or dyspnea.   Follow-Up: pt is due for his yearly f/u with Dr. Radford Pax in Feb 2019. He was instructed to keep this appt and to call back sooner if any issues.   Carolena Fairbank Ladoris Gene, MHS [facility] 02/15/2017 10:22 AM

## 2017-02-15 NOTE — Patient Instructions (Addendum)
Medication Instructions:  1. DECREASE CARDIZEM TO 120 MG ONCE A DAY; NEW RX HAS BEEN SENT IN  Labwork: NONE ORDERED TODAY  Testing/Procedures: NONE ORDERED TODAY  Follow-Up: DR. Radford Pax ON 05/02/17 @ 9:20   Any Other Special Instructions Will Be Listed Below (If Applicable).  MONITOR BLOOD PRESSURE ; CALL IF SYSTOLIC NUMBER (TOP NUMBER) IS 90 OR BELOW  MONITOR HEART RATE, CALL IF RESTING HEART RATE IS IN THE 100'S   If you need a refill on your cardiac medications before your next appointment, please call your pharmacy.

## 2017-02-28 ENCOUNTER — Telehealth: Payer: Self-pay

## 2017-02-28 DIAGNOSIS — E785 Hyperlipidemia, unspecified: Secondary | ICD-10-CM

## 2017-02-28 MED ORDER — PRAVASTATIN SODIUM 80 MG PO TABS: 80.0000 mg | ORAL_TABLET | Freq: Every evening | ORAL | 3 refills | Status: DC

## 2017-02-28 NOTE — Telephone Encounter (Signed)
Spoke with patient's spouse (DPR on file). Instructed her to start pravastatin 80 mg once a day and patient is scheduled for repeat FLP and ALT on 04/23/17. She verbalized understanding and thanked me for the call.

## 2017-02-28 NOTE — Telephone Encounter (Signed)
Patient's spouse returned call. She states that patient is willing to go back on pravastatin. I informed her that I would forward to Dr. Radford Pax for correct dosage adjustment required. She verbalized understanding and thanked me for the call.

## 2017-02-28 NOTE — Telephone Encounter (Signed)
Restart Pravastatin at 80mg  daily and repeat FLP and ALT in 6 weeks

## 2017-04-02 ENCOUNTER — Telehealth: Payer: Self-pay | Admitting: Cardiology

## 2017-04-02 NOTE — Telephone Encounter (Signed)
Returned call to patient. Patient states HR has been sustaining in the 110-130s. Patient states his BP this am was 94/54 HR 118 so he did not take his daily dose of cardizem. He states he has not taken his cardizem since 1/13 due to low BP. While speaking with the patient he took his BP 114/100 HR 138. I advised patient to take his cardizem now, and advised him to take his cardizem in the afternoons verses in the morning when the BP is lower. Patient denies chest pain, sob, dizziness or syncope. Per Dr. Radford Pax have patient follow up in the afib clinic and continue to take his cardizem 120 mg daily.  Patient in agreement with plan and thanked me for the call.

## 2017-04-02 NOTE — Telephone Encounter (Signed)
New Message  STAT if HR is under 50 or over 120 (normal HR is 60-100 beats per minute)  1) What is your heart rate? 118  2) Do you have a log of your heart rate readings (document readings)? 118  3) Do you have any other symptoms? No  Pt wife call requesting to speak with RN to get f/u instructions for pts high heart rate.

## 2017-04-03 NOTE — Telephone Encounter (Signed)
Left message to set up appt.

## 2017-04-04 ENCOUNTER — Encounter (HOSPITAL_COMMUNITY): Payer: Self-pay | Admitting: Nurse Practitioner

## 2017-04-04 ENCOUNTER — Ambulatory Visit (HOSPITAL_COMMUNITY)
Admission: RE | Admit: 2017-04-04 | Discharge: 2017-04-04 | Disposition: A | Discharge status: Home / Self Care | Payer: PPO | Source: Ambulatory Visit | Attending: Nurse Practitioner | Admitting: Nurse Practitioner

## 2017-04-04 VITALS — BP 136/78 | HR 81 | Ht 68.0 in | Wt 200.4 lb

## 2017-04-04 DIAGNOSIS — I482 Chronic atrial fibrillation, unspecified: Secondary | ICD-10-CM

## 2017-04-04 DIAGNOSIS — Z951 Presence of aortocoronary bypass graft: Secondary | ICD-10-CM | POA: Insufficient documentation

## 2017-04-04 DIAGNOSIS — Z7901 Long term (current) use of anticoagulants: Secondary | ICD-10-CM | POA: Diagnosis not present

## 2017-04-04 DIAGNOSIS — E039 Hypothyroidism, unspecified: Secondary | ICD-10-CM | POA: Diagnosis not present

## 2017-04-04 DIAGNOSIS — I2510 Atherosclerotic heart disease of native coronary artery without angina pectoris: Secondary | ICD-10-CM | POA: Diagnosis not present

## 2017-04-04 DIAGNOSIS — Z86010 Personal history of colonic polyps: Secondary | ICD-10-CM | POA: Diagnosis not present

## 2017-04-04 DIAGNOSIS — Z8673 Personal history of transient ischemic attack (TIA), and cerebral infarction without residual deficits: Secondary | ICD-10-CM | POA: Insufficient documentation

## 2017-04-04 DIAGNOSIS — Z79899 Other long term (current) drug therapy: Secondary | ICD-10-CM | POA: Insufficient documentation

## 2017-04-04 DIAGNOSIS — I447 Left bundle-branch block, unspecified: Secondary | ICD-10-CM | POA: Insufficient documentation

## 2017-04-04 DIAGNOSIS — Z87891 Personal history of nicotine dependence: Secondary | ICD-10-CM | POA: Diagnosis not present

## 2017-04-04 DIAGNOSIS — N183 Chronic kidney disease, stage 3 (moderate): Secondary | ICD-10-CM | POA: Diagnosis not present

## 2017-04-04 DIAGNOSIS — I130 Hypertensive heart and chronic kidney disease with heart failure and stage 1 through stage 4 chronic kidney disease, or unspecified chronic kidney disease: Secondary | ICD-10-CM | POA: Diagnosis not present

## 2017-04-04 DIAGNOSIS — I481 Persistent atrial fibrillation: Secondary | ICD-10-CM | POA: Diagnosis present

## 2017-04-04 DIAGNOSIS — E785 Hyperlipidemia, unspecified: Secondary | ICD-10-CM | POA: Insufficient documentation

## 2017-04-04 DIAGNOSIS — Z886 Allergy status to analgesic agent status: Secondary | ICD-10-CM | POA: Diagnosis not present

## 2017-04-04 DIAGNOSIS — I5032 Chronic diastolic (congestive) heart failure: Secondary | ICD-10-CM | POA: Insufficient documentation

## 2017-04-04 MED ORDER — METOPROLOL SUCCINATE ER 25 MG PO TB24: 12.5000 mg | ORAL_TABLET | Freq: Every day | ORAL | 3 refills | Status: DC

## 2017-04-04 NOTE — Progress Notes (Signed)
Primary Care Physician: [name], MD Referring Physician:Dr. Nelma Rothman is a 84 y.o. male with a h/o CAD s/p  CABG , HTN, h/o mitral valve repair with persistent atrial fibrillation previously on amiodarone but discontinued 2/2 side effects of the drug in spring of 2017. Rate control was felt to be the best approach to manage afib.  He has done well until recently when he has noted low BP readings. He holds his cardizem on those days and then will notice elevated heart rates 1-2 days later. This has been a cycle for the last several weeks.He brings in BP readings form home that show  a few BP readings of 98 /56 and some HR's  of 100-118 bpm. HR and BP well manged today.  Today, he denies symptoms of palpitations, chest pain, shortness of breath, orthopnea, PND, lower extremity edema, dizziness, presyncope, syncope, or neurologic sequela. The patient is tolerating medications without difficulties and is otherwise without complaint today.   Past Medical History:  Diagnosis Date  . Adenomatous colon polyp   . Aortic stenosis    a. mild by echo 2017.  Marland Kitchen CAD (coronary artery disease)    a. S/P CABG x 1 @ time of MV annuloplasty;  b. 02/2010 ETT: neg for ischemia.  . Carotid artery stenosis    1-39% by dopplers 10/2016  . Chronic diastolic CHF (congestive heart failure) ([place])   . CKD (chronic kidney disease), stage III ([place])   . Current use of long term anticoagulation    a. Coumadin in setting of afib.  . Diverticulosis    Colonoscopy  . Dyslipidemia   . Essential hypertension   . H/O mitral valve repair   . Hemorrhoids   . Hyperlipidemia   . Hypertension   . Persistent atrial fibrillation (HCC)    a. CHA2DS2VASc = 6 -->chronic coumadin;  b. Prev on amio-->d/c 2/2 hypothyroidism. >> resumed 5/16. c. Notes from 2017 indicate interstitial lung disease by pulm appt, question amio toxicity, also increased liver attenuation. Rate control pursued and amiodarone  discontinued.  Marland Kitchen TIA (transient ischemic attack)    Past Surgical History:  Procedure Laterality Date  . CORONARY ARTERY BYPASS GRAFT    . MITRAL VALVE REPAIR      Current Outpatient Medications  Medication Sig Dispense Refill  . Cholecalciferol (VITAMIN D-3 PO) Take 5,000 Units by mouth daily.     . hydrocortisone (ANUSOL-HC) 2.5 % rectal cream Place 1 application rectally 2 (two) times daily. 30 g 1  . levothyroxine (SYNTHROID, LEVOTHROID) 50 MCG tablet Take 50 mcg by mouth daily before breakfast.    . NATURAL PSYLLIUM FIBER PO Take 1 Dose by mouth daily as needed (constipation).     . WARFARIN SODIUM PO Take 2 mg by mouth as directed. Take by mouth as directed by the coumadin clinic    . metoprolol succinate (TOPROL XL) 25 MG 24 hr tablet Take 0.5 tablets (12.5 mg total) by mouth at bedtime. 30 tablet 3  . pravastatin (PRAVACHOL) 80 MG tablet Take 1 tablet (80 mg total) by mouth every evening. (Patient not taking: Reported on 04/04/2017) 90 tablet 3   No current facility-administered medications for this encounter.     Allergies  Allergen Reactions  . Asa [Aspirin]     Bleeding  . Flomax [Tamsulosin]     Made his heart go out of rhythm    Social History   Socioeconomic History  . Marital status: Married    Spouse  name: Not on file  . Number of children: Not on file  . Years of education: Not on file  . Highest education level: Not on file  Social Needs  . Financial resource strain: Not on file  . Food insecurity - worry: Not on file  . Food insecurity - inability: Not on file  . Transportation needs - medical: Not on file  . Transportation needs - non-medical: Not on file  Occupational History  . 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 Smoking status: Former Smoker    Packs/day: 0.10    Years: 2.00    Pack years: 0.20    Types: Cigarettes    Last attempt to quit: 03/20/1955    Years since quitting: 62.0  . Smokeless tobacco: Never Used    Substance and Sexual Activity  . Alcohol use: No    Alcohol/week: 0.0 oz  . Drug use: No  . Sexual activity: Not Currently  Other Topics Concern  . Not on file  Social History Narrative  . Not on file    Family History  Problem Relation Age of Onset  . Stroke Mother        Deceased  . Heart disease Father        Deceased  . Heart failure Brother        Deceased  . Stroke Brother     ROS- All systems are reviewed and negative except as per the HPI above  Physical Exam: Vitals:   04/04/17 1443  BP: 136/78  Pulse: 81  Weight: 200 lb 6.4 oz (90.9 kg)  Height: 5\' 8"  (1.727 m)   Wt Readings from Last 3 Encounters:  04/04/17 200 lb 6.4 oz (90.9 kg)  02/15/17 199 lb 12.8 oz (90.6 kg)  11/21/16 200 lb (90.7 kg)    Labs: Lab Results  Component Value Date   NA 138 12/23/2015   K 5.1 12/23/2015   CL 102 12/23/2015   CO2 28 12/23/2015   GLUCOSE 106 (H) 12/23/2015   BUN 22 12/23/2015   CREATININE 1.80 (H) 12/23/2015   CALCIUM 9.5 12/23/2015   Lab Results  Component Value Date   INR 2.0 12/16/2015   Lab Results  Component Value Date   CHOL 119 03/16/2016   HDL 45 03/16/2016   LDLCALC 54 03/16/2016   TRIG 101 03/16/2016     GEN- The patient is well appearing, alert and oriented x 3 today.   Head- normocephalic, atraumatic Eyes-  Sclera clear, conjunctiva pink Ears- hearing intact Oropharynx- clear Neck- supple, no JVP Lymph- no cervical lymphadenopathy Lungs- Clear to ausculation bilaterally, normal work of breathing Heart- irregular rate and rhythm, no murmurs, rubs or gallops, PMI not laterally displaced GI- soft, NT, ND, + BS Extremities- no clubbing, cyanosis, or edema MS- no significant deformity or atrophy Skin- no rash or lesion Psych- euthymic mood, full affect Neuro- strength and sensation are intact  EKG- afib at 81 bpm, qrs int 114 ms, qtc 471 ms Epic records reviewed    Assessment and Plan: 1. Chronic afib Recently has noted some  lower BP readings for which he holds Cardizem 120 mg qd and then will have rebound elevated HR's Stop cardizem Will try low dose metoprolol succinate  25 mg 1/2 tab a day to take at hs Hopefully this will control v rates without significant drops in BP Continue warfarin for chasvasc score of at least 4, followed thru the [place]  F/u here in  2 weeks with BP/HR readings   Butch Penny C. Sharonlee Nine, [facility] [address] [place], [place] [address] [phone]

## 2017-04-04 NOTE — Patient Instructions (Signed)
STOP Diltiazem (Cardizem)  START Metoprolol (toprol) 1/2 tablet once a day at bedtime.

## 2017-04-16 ENCOUNTER — Encounter (HOSPITAL_COMMUNITY): Payer: Self-pay | Admitting: Nurse Practitioner

## 2017-04-16 ENCOUNTER — Ambulatory Visit (HOSPITAL_COMMUNITY)
Admission: RE | Admit: 2017-04-16 | Discharge: 2017-04-16 | Disposition: A | Discharge status: Home / Self Care | Payer: PPO | Source: Ambulatory Visit | Attending: Nurse Practitioner | Admitting: Nurse Practitioner

## 2017-04-16 VITALS — BP 136/74 | HR 100 | Ht 68.0 in | Wt 201.2 lb

## 2017-04-16 DIAGNOSIS — I5032 Chronic diastolic (congestive) heart failure: Secondary | ICD-10-CM | POA: Diagnosis not present

## 2017-04-16 DIAGNOSIS — Z7901 Long term (current) use of anticoagulants: Secondary | ICD-10-CM | POA: Insufficient documentation

## 2017-04-16 DIAGNOSIS — I482 Chronic atrial fibrillation, unspecified: Secondary | ICD-10-CM

## 2017-04-16 DIAGNOSIS — Z951 Presence of aortocoronary bypass graft: Secondary | ICD-10-CM | POA: Insufficient documentation

## 2017-04-16 DIAGNOSIS — Z79899 Other long term (current) drug therapy: Secondary | ICD-10-CM | POA: Diagnosis not present

## 2017-04-16 DIAGNOSIS — I350 Nonrheumatic aortic (valve) stenosis: Secondary | ICD-10-CM | POA: Insufficient documentation

## 2017-04-16 DIAGNOSIS — Z8673 Personal history of transient ischemic attack (TIA), and cerebral infarction without residual deficits: Secondary | ICD-10-CM | POA: Insufficient documentation

## 2017-04-16 DIAGNOSIS — N183 Chronic kidney disease, stage 3 (moderate): Secondary | ICD-10-CM | POA: Diagnosis not present

## 2017-04-16 DIAGNOSIS — Z86010 Personal history of colonic polyps: Secondary | ICD-10-CM | POA: Diagnosis not present

## 2017-04-16 DIAGNOSIS — I2510 Atherosclerotic heart disease of native coronary artery without angina pectoris: Secondary | ICD-10-CM | POA: Insufficient documentation

## 2017-04-16 DIAGNOSIS — E039 Hypothyroidism, unspecified: Secondary | ICD-10-CM | POA: Insufficient documentation

## 2017-04-16 DIAGNOSIS — E785 Hyperlipidemia, unspecified: Secondary | ICD-10-CM | POA: Insufficient documentation

## 2017-04-16 DIAGNOSIS — I130 Hypertensive heart and chronic kidney disease with heart failure and stage 1 through stage 4 chronic kidney disease, or unspecified chronic kidney disease: Secondary | ICD-10-CM | POA: Diagnosis not present

## 2017-04-16 DIAGNOSIS — Z87891 Personal history of nicotine dependence: Secondary | ICD-10-CM | POA: Diagnosis not present

## 2017-04-16 DIAGNOSIS — Z886 Allergy status to analgesic agent status: Secondary | ICD-10-CM | POA: Insufficient documentation

## 2017-04-16 DIAGNOSIS — I481 Persistent atrial fibrillation: Secondary | ICD-10-CM | POA: Diagnosis not present

## 2017-04-16 MED ORDER — METOPROLOL SUCCINATE ER 25 MG PO TB24: 12.5000 mg | ORAL_TABLET | Freq: Two times a day (BID) | ORAL | 3 refills | Status: DC

## 2017-04-16 NOTE — Patient Instructions (Signed)
Your physician has recommended you make the following change in your medication: 1) Increase metoprolol to 1/2 tablet twice a day

## 2017-04-17 NOTE — Progress Notes (Signed)
Primary Care Physician: [name], MD Referring Physician:Dr. Nelma Rothman is a 84 y.o. male with a h/o CAD s/p  CABG , CKD, HTN, h/o mitral valve repair with persistent atrial fibrillation previously on amiodarone but discontinued 2/2 side effects of the drug in spring of 2017. Rate control was felt to be the best approach to manage afib.  He has done well until recently when he has noted low BP readings. He holds his cardizem on those days and then will notice elevated heart rates 1-2 days later. This has been a cycle for the last several weeks.He brings in BP readings form home that show  a few BP readings of 98 /56 and some HR's  of 100-118 bpm. HR and BP well manged today.  F/u in afib clinic, 1/29.  On last visit, Cardizem was stopped and he was started on metoprolol succinate 25 mg 1/2 tab a day. His BP and HR readings have been ok on this but still a few episodes of high HR's that do not seem to be sustained. He has not had any significant low BP readings. A few around 18-563 systolic but he did not feel bad with these and a few hours later looked to be in normal range.He does not seem to feel bad with high HR readings as well, but doe not the different reading. He checks his V/S several times a day and if a high HR is seen it is usually resolved within a few hours.  Today, he denies symptoms of palpitations, chest pain, shortness of breath, orthopnea, PND, lower extremity edema, dizziness, presyncope, syncope, or neurologic sequela. The patient is tolerating medications without difficulties and is otherwise without complaint today.   Past Medical History:  Diagnosis Date  . Adenomatous colon polyp   . Aortic stenosis    a. mild by echo 2017.  Marland Kitchen CAD (coronary artery disease)    a. S/P CABG x 1 @ time of MV annuloplasty;  b. 02/2010 ETT: neg for ischemia.  . Carotid artery stenosis    1-39% by dopplers 10/2016  . Chronic diastolic CHF (congestive heart failure) ([place])     . CKD (chronic kidney disease), stage III ([place])   . Current use of long term anticoagulation    a. Coumadin in setting of afib.  . Diverticulosis    Colonoscopy  . Dyslipidemia   . Essential hypertension   . H/O mitral valve repair   . Hemorrhoids   . Hyperlipidemia   . Hypertension   . Persistent atrial fibrillation (HCC)    a. CHA2DS2VASc = 6 -->chronic coumadin;  b. Prev on amio-->d/c 2/2 hypothyroidism. >> resumed 5/16. c. Notes from 2017 indicate interstitial lung disease by pulm appt, question amio toxicity, also increased liver attenuation. Rate control pursued and amiodarone discontinued.  Marland Kitchen TIA (transient ischemic attack)    Past Surgical History:  Procedure Laterality Date  . CORONARY ARTERY BYPASS GRAFT    . MITRAL VALVE REPAIR      Current Outpatient Medications  Medication Sig Dispense Refill  . Cholecalciferol (VITAMIN D-3 PO) Take 5,000 Units by mouth daily.     . hydrocortisone (ANUSOL-HC) 2.5 % rectal cream Place 1 application rectally 2 (two) times daily. 30 g 1  . levothyroxine (SYNTHROID, LEVOTHROID) 50 MCG tablet Take 50 mcg by mouth daily before breakfast.    . metoprolol succinate (TOPROL XL) 25 MG 24 hr tablet Take 0.5 tablets (12.5 mg total) by mouth 2 (two) times daily.  30 tablet 3  . NATURAL PSYLLIUM FIBER PO Take 1 Dose by mouth daily as needed (constipation).     . pravastatin (PRAVACHOL) 80 MG tablet Take 1 tablet (80 mg total) by mouth every evening. 90 tablet 3  . WARFARIN SODIUM PO Take 2 mg by mouth as directed. Take by mouth as directed by the coumadin clinic     No current facility-administered medications for this encounter.     Allergies  Allergen Reactions  . Asa [Aspirin]     Bleeding  . Flomax [Tamsulosin]     Made his heart go out of rhythm    Social History   Socioeconomic History  . Marital status: Married    Spouse name: Not on file  . Number of children: Not on file  . Years of education: Not on file  . Highest education  level: Not on file  Social Needs  . Financial resource strain: Not on file  . Food insecurity - worry: Not on file  . Food insecurity - inability: Not on file  . Transportation needs - medical: Not on file  . Transportation needs - non-medical: Not on file  Occupational History  . 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 Smoking status: Former Smoker    Packs/day: 0.10    Years: 2.00    Pack years: 0.20    Types: Cigarettes    Last attempt to quit: 03/20/1955    Years since quitting: 62.1  . Smokeless tobacco: Never Used  Substance and Sexual Activity  . Alcohol use: No    Alcohol/week: 0.0 oz  . Drug use: No  . Sexual activity: Not Currently  Other Topics Concern  . Not on file  Social History Narrative  . Not on file    Family History  Problem Relation Age of Onset  . Stroke Mother        Deceased  . Heart disease Father        Deceased  . Heart failure Brother        Deceased  . Stroke Brother     ROS- All systems are reviewed and negative except as per the HPI above  Physical Exam: Vitals:   04/16/17 1448  BP: 136/74  Pulse: 100  Weight: 201 lb 3.2 oz (91.3 kg)  Height: 5\' 8"  (1.727 m)   Wt Readings from Last 3 Encounters:  04/16/17 201 lb 3.2 oz (91.3 kg)  04/04/17 200 lb 6.4 oz (90.9 kg)  02/15/17 199 lb 12.8 oz (90.6 kg)    Labs: Lab Results  Component Value Date   NA 138 12/23/2015   K 5.1 12/23/2015   CL 102 12/23/2015   CO2 28 12/23/2015   GLUCOSE 106 (H) 12/23/2015   BUN 22 12/23/2015   CREATININE 1.80 (H) 12/23/2015   CALCIUM 9.5 12/23/2015   Lab Results  Component Value Date   INR 2.0 12/16/2015   Lab Results  Component Value Date   CHOL 119 03/16/2016   HDL 45 03/16/2016   LDLCALC 54 03/16/2016   TRIG 101 03/16/2016     GEN- The patient is well appearing, alert and oriented x 3 today.   Head- normocephalic, atraumatic Eyes-  Sclera clear, conjunctiva pink Ears- hearing intact Oropharynx-  clear Neck- supple, no JVP Lymph- no cervical lymphadenopathy Lungs- Clear to ausculation bilaterally, normal work of breathing Heart- irregular rate and rhythm, no murmurs, rubs or gallops, PMI not laterally displaced GI- soft, NT, ND, +  BS Extremities- no clubbing, cyanosis, or edema MS- no significant deformity or atrophy Skin- no rash or lesion Psych- euthymic mood, full affect Neuro- strength and sensation are intact  EKG- afib at 100  bpm, qrs int 114 ms, qtc 471 ms Epic records reviewed    Assessment and Plan: 1. Chronic afib Recently has noted some lower BP readings for which he holds Cardizem 120 mg qd and then will have rebound elevated HR's Reviewing home v/s, he has reasonable rate control of afib with a few spikes in HR Off cardizem Increase low dose metoprolol succinate  25 mg 1/2 tab a day bid Hopefully this will control v rates without significant drops in BP Continue warfarin for chasvasc score of at least 4, followed thru the [place]  F/u in one week   Butch Penny C. Daaiel Starlin, [facility] [address] [place], [place] [address] [phone]

## 2017-04-23 ENCOUNTER — Other Ambulatory Visit: Payer: PPO

## 2017-04-23 DIAGNOSIS — E785 Hyperlipidemia, unspecified: Secondary | ICD-10-CM | POA: Diagnosis not present

## 2017-04-23 LAB — LIPID PANEL
CHOLESTEROL TOTAL: 185 mg/dL (ref 100–199)
Chol/HDL Ratio: 5.3 ratio — ABNORMAL HIGH (ref 0.0–5.0)
HDL: 35 mg/dL — ABNORMAL LOW (ref >39)
LDL Calculated: 127 mg/dL — ABNORMAL HIGH (ref 0–99)
Triglycerides: 117 mg/dL (ref 0–149)
VLDL Cholesterol Cal: 23 mg/dL (ref 5–40)

## 2017-04-23 LAB — ALT: ALT: 19 IU/L (ref 0–44)

## 2017-04-24 ENCOUNTER — Encounter (HOSPITAL_COMMUNITY): Payer: Self-pay | Admitting: Nurse Practitioner

## 2017-04-24 ENCOUNTER — Ambulatory Visit (HOSPITAL_COMMUNITY)
Admission: RE | Admit: 2017-04-24 | Discharge: 2017-04-24 | Disposition: A | Discharge status: Home / Self Care | Payer: PPO | Source: Ambulatory Visit | Attending: Nurse Practitioner | Admitting: Nurse Practitioner

## 2017-04-24 ENCOUNTER — Telehealth: Payer: Self-pay | Admitting: Cardiology

## 2017-04-24 VITALS — BP 136/74 | HR 101 | Ht 68.0 in | Wt 198.6 lb

## 2017-04-24 DIAGNOSIS — Z86010 Personal history of colonic polyps: Secondary | ICD-10-CM | POA: Insufficient documentation

## 2017-04-24 DIAGNOSIS — E039 Hypothyroidism, unspecified: Secondary | ICD-10-CM | POA: Insufficient documentation

## 2017-04-24 DIAGNOSIS — I130 Hypertensive heart and chronic kidney disease with heart failure and stage 1 through stage 4 chronic kidney disease, or unspecified chronic kidney disease: Secondary | ICD-10-CM | POA: Insufficient documentation

## 2017-04-24 DIAGNOSIS — Z7901 Long term (current) use of anticoagulants: Secondary | ICD-10-CM | POA: Diagnosis not present

## 2017-04-24 DIAGNOSIS — Z79899 Other long term (current) drug therapy: Secondary | ICD-10-CM | POA: Diagnosis not present

## 2017-04-24 DIAGNOSIS — N183 Chronic kidney disease, stage 3 (moderate): Secondary | ICD-10-CM | POA: Insufficient documentation

## 2017-04-24 DIAGNOSIS — I2510 Atherosclerotic heart disease of native coronary artery without angina pectoris: Secondary | ICD-10-CM | POA: Insufficient documentation

## 2017-04-24 DIAGNOSIS — Z951 Presence of aortocoronary bypass graft: Secondary | ICD-10-CM | POA: Insufficient documentation

## 2017-04-24 DIAGNOSIS — Z8673 Personal history of transient ischemic attack (TIA), and cerebral infarction without residual deficits: Secondary | ICD-10-CM | POA: Diagnosis not present

## 2017-04-24 DIAGNOSIS — I350 Nonrheumatic aortic (valve) stenosis: Secondary | ICD-10-CM | POA: Insufficient documentation

## 2017-04-24 DIAGNOSIS — Z79890 Hormone replacement therapy (postmenopausal): Secondary | ICD-10-CM | POA: Diagnosis not present

## 2017-04-24 DIAGNOSIS — Z87891 Personal history of nicotine dependence: Secondary | ICD-10-CM | POA: Insufficient documentation

## 2017-04-24 DIAGNOSIS — I5032 Chronic diastolic (congestive) heart failure: Secondary | ICD-10-CM | POA: Insufficient documentation

## 2017-04-24 DIAGNOSIS — E785 Hyperlipidemia, unspecified: Secondary | ICD-10-CM

## 2017-04-24 DIAGNOSIS — I482 Chronic atrial fibrillation, unspecified: Secondary | ICD-10-CM

## 2017-04-24 MED ORDER — DILTIAZEM HCL ER 60 MG PO CP12: 60.0000 mg | ORAL_CAPSULE | Freq: Two times a day (BID) | ORAL | 3 refills | Status: DC

## 2017-04-24 NOTE — Telephone Encounter (Signed)
Notes recorded by Teressa Senter, RN on 04/24/2017 at 9:29 AM EST Patient made aware of results. Patient stated he stop taking his pravastatin about 4 weeks ago and has been trying diet control. Encouraged patient to restart his pravastatin due to elevated LDL level. Patient in agreement and scheduled for FLP and ALT on 06/10/17   Notes recorded by Sueanne Margarita, MD on 04/23/2017 at 3:44 PM EST LDL not at goal - please add Zetia 10mg  daily and repeat FLP and ALT in 6 weeks

## 2017-04-24 NOTE — Progress Notes (Signed)
Primary Care Physician: [name], MD Referring Physician:Dr. Nelma Rothman is a 84 y.o. male with a h/o CAD s/p  CABG , CKD, HTN, h/o mitral valve repair with persistent atrial fibrillation previously on amiodarone but discontinued 2/2 side effects of the drug in spring of 2017. Rate control was felt to be the best approach to manage afib.  He has done well until recently when he has noted low BP readings. He holds his cardizem on those days and then will notice elevated heart rates 1-2 days later. This has been a cycle for the last several weeks.He brings in BP readings form home that show  a few BP readings of 98 /56 and some HR's  of 100-118 bpm. HR and BP well manged today.  F/u in afib clinic, 1/29.  On last visit, Cardizem was stopped and he was started on metoprolol succinate 25 mg 1/2 tab a day. His BP and HR readings have been ok on this but still a few episodes of high HR's that do not seem to be sustained. He has not had any significant low BP readings. A few around 03-474 systolic but he did not feel bad with these and a few hours later looked to be in normal range.He does not seem to feel bad with high HR readings as well, but doe not the different reading. He checks his V/S several times a day and if a high HR is seen it is usually resolved within a few hours. IT was decided to increase metoprolol.  F/u in afib clinic 2/5. He is not tolerating the BB for fatigue. He is chronic afib. He forgot to bring his BP/HR readings form home, doe not remember any specific episodes that jumped out of him, just has noted fatigue.   Today, he denies symptoms of palpitations, chest pain, shortness of breath, orthopnea, PND, lower extremity edema, dizziness, presyncope, syncope, or neurologic sequela. The patient is tolerating medications without difficulties and is otherwise without complaint today.   Past Medical History:  Diagnosis Date  . Adenomatous colon polyp   . Aortic  stenosis    a. mild by echo 2017.  Marland Kitchen CAD (coronary artery disease)    a. S/P CABG x 1 @ time of MV annuloplasty;  b. 02/2010 ETT: neg for ischemia.  . Carotid artery stenosis    1-39% by dopplers 10/2016  . Chronic diastolic CHF (congestive heart failure) ([place])   . CKD (chronic kidney disease), stage III ([place])   . Current use of long term anticoagulation    a. Coumadin in setting of afib.  . Diverticulosis    Colonoscopy  . Dyslipidemia   . Essential hypertension   . H/O mitral valve repair   . Hemorrhoids   . Hyperlipidemia   . Hypertension   . Persistent atrial fibrillation (HCC)    a. CHA2DS2VASc = 6 -->chronic coumadin;  b. Prev on amio-->d/c 2/2 hypothyroidism. >> resumed 5/16. c. Notes from 2017 indicate interstitial lung disease by pulm appt, question amio toxicity, also increased liver attenuation. Rate control pursued and amiodarone discontinued.  Marland Kitchen TIA (transient ischemic attack)    Past Surgical History:  Procedure Laterality Date  . CORONARY ARTERY BYPASS GRAFT    . MITRAL VALVE REPAIR      Current Outpatient Medications  Medication Sig Dispense Refill  . Cholecalciferol (VITAMIN D-3 PO) Take 5,000 Units by mouth daily.     . hydrocortisone (ANUSOL-HC) 2.5 % rectal cream Place 1 application rectally  2 (two) times daily. 30 g 1  . levothyroxine (SYNTHROID, LEVOTHROID) 50 MCG tablet Take 50 mcg by mouth daily before breakfast.    . NATURAL PSYLLIUM FIBER PO Take 1 Dose by mouth daily as needed (constipation).     . WARFARIN SODIUM PO Take 2 mg by mouth as directed. Take by mouth as directed by the coumadin clinic    . diltiazem (CARDIZEM SR) 60 MG 12 hr capsule Take 1 capsule (60 mg total) by mouth 2 (two) times daily. 60 capsule 3  . pravastatin (PRAVACHOL) 80 MG tablet Take 1 tablet (80 mg total) by mouth every evening. (Patient not taking: Reported on 04/24/2017) 90 tablet 3   No current facility-administered medications for this encounter.     Allergies  Allergen  Reactions  . Asa [Aspirin]     Bleeding  . Flomax [Tamsulosin]     Made his heart go out of rhythm    Social History   Socioeconomic History  . Marital status: Married    Spouse name: Not on file  . Number of children: Not on file  . Years of education: Not on file  . Highest education level: Not on file  Social Needs  . Financial resource strain: Not on file  . Food insecurity - worry: Not on file  . Food insecurity - inability: Not on file  . Transportation needs - medical: Not on file  . Transportation needs - non-medical: Not on file  Occupational History  . 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 Smoking status: Former Smoker    Packs/day: 0.10    Years: 2.00    Pack years: 0.20    Types: Cigarettes    Last attempt to quit: 03/20/1955    Years since quitting: 62.1  . Smokeless tobacco: Never Used  Substance and Sexual Activity  . Alcohol use: No    Alcohol/week: 0.0 oz  . Drug use: No  . Sexual activity: Not Currently  Other Topics Concern  . Not on file  Social History Narrative  . Not on file    Family History  Problem Relation Age of Onset  . Stroke Mother        Deceased  . Heart disease Father        Deceased  . Heart failure Brother        Deceased  . Stroke Brother     ROS- All systems are reviewed and negative except as per the HPI above  Physical Exam: Vitals:   04/24/17 1330  BP: 136/74  Pulse: (!) 101  Weight: 198 lb 9.6 oz (90.1 kg)  Height: 5\' 8"  (1.727 m)   Wt Readings from Last 3 Encounters:  04/24/17 198 lb 9.6 oz (90.1 kg)  04/16/17 201 lb 3.2 oz (91.3 kg)  04/04/17 200 lb 6.4 oz (90.9 kg)    Labs: Lab Results  Component Value Date   NA 138 12/23/2015   K 5.1 12/23/2015   CL 102 12/23/2015   CO2 28 12/23/2015   GLUCOSE 106 (H) 12/23/2015   BUN 22 12/23/2015   CREATININE 1.80 (H) 12/23/2015   CALCIUM 9.5 12/23/2015   Lab Results  Component Value Date   INR 2.0 12/16/2015   Lab Results    Component Value Date   CHOL 185 04/23/2017   HDL 35 (L) 04/23/2017   LDLCALC 127 (H) 04/23/2017   TRIG 117 04/23/2017     GEN- The patient is well appearing,  alert and oriented x 3 today.   Head- normocephalic, atraumatic Eyes-  Sclera clear, conjunctiva pink Ears- hearing intact Oropharynx- clear Neck- supple, no JVP Lymph- no cervical lymphadenopathy Lungs- Clear to ausculation bilaterally, normal work of breathing Heart- irregular rate and rhythm, no murmurs, rubs or gallops, PMI not laterally displaced GI- soft, NT, ND, + BS Extremities- no clubbing, cyanosis, or edema MS- no significant deformity or atrophy Skin- no rash or lesion Psych- euthymic mood, full affect Neuro- strength and sensation are intact  EKG- afib at 100  bpm, qrs int 157ms, qtc 407 ms Epic records reviewed    Assessment and Plan: 1. Chronic afib Recently has noted some lower BP readings for which he holds Cardizem 120 mg qd and then will have rebound elevated HR's Reviewing home v/s, he has reasonable rate control of afib with a few spikes in HR  tried low dose bid metoprolol but did not tolerate 2/2 fatigue Stop metoprolol  Retry cardizem but SR formulation 60 mg bid Hopefully this will control v rates without significant drops in BP He will keep records of V/S and review with Dr. Radford Pax with f/u appointment 2/14. Continue warfarin for chasvasc score of at least 4, followed thru the [place]  F/u with Dr. Radford Pax as scheduled  Geroge Baseman. [place], [facility] [address] [place], [place] [address] [phone]

## 2017-04-24 NOTE — Patient Instructions (Signed)
Your physician has recommended you make the following change in your medication: 1)stop metoprolol 2)Start cardizem 60mg  twice a day

## 2017-04-24 NOTE — Telephone Encounter (Signed)
Follow Up: ° ° °Returning your call,concerning his lab results. °

## 2017-04-25 ENCOUNTER — Other Ambulatory Visit (HOSPITAL_COMMUNITY): Payer: Self-pay | Admitting: *Deleted

## 2017-04-25 MED ORDER — DILTIAZEM HCL ER 60 MG PO CP12: 60.0000 mg | ORAL_CAPSULE | Freq: Two times a day (BID) | ORAL | 3 refills | Status: DC

## 2017-04-26 ENCOUNTER — Telehealth (HOSPITAL_COMMUNITY): Payer: Self-pay | Admitting: *Deleted

## 2017-04-26 MED ORDER — NEBIVOLOL HCL 5 MG PO TABS: 5.0000 mg | ORAL_TABLET | Freq: Every day | ORAL | 3 refills | Status: DC

## 2017-04-26 NOTE — Telephone Encounter (Signed)
Pt wife called in stating their pharmacy nor surrounding pharmacies can dispense the cardizem 60 SR dosing. Discussed with Roderic Palau NP will start Bystolic 5mg  at bedtime daily. Wife informed of change and will call if issues.

## 2017-05-02 ENCOUNTER — Ambulatory Visit: Payer: PPO | Admitting: Cardiology

## 2017-05-02 ENCOUNTER — Encounter: Payer: Self-pay | Admitting: Cardiology

## 2017-05-02 VITALS — BP 130/60 | HR 86 | Ht 68.0 in | Wt 201.0 lb

## 2017-05-02 DIAGNOSIS — I6523 Occlusion and stenosis of bilateral carotid arteries: Secondary | ICD-10-CM

## 2017-05-02 DIAGNOSIS — Z9889 Other specified postprocedural states: Secondary | ICD-10-CM

## 2017-05-02 DIAGNOSIS — I482 Chronic atrial fibrillation: Secondary | ICD-10-CM

## 2017-05-02 DIAGNOSIS — I2510 Atherosclerotic heart disease of native coronary artery without angina pectoris: Secondary | ICD-10-CM | POA: Diagnosis not present

## 2017-05-02 DIAGNOSIS — E785 Hyperlipidemia, unspecified: Secondary | ICD-10-CM | POA: Diagnosis not present

## 2017-05-02 DIAGNOSIS — I4821 Permanent atrial fibrillation: Secondary | ICD-10-CM

## 2017-05-02 DIAGNOSIS — I5032 Chronic diastolic (congestive) heart failure: Secondary | ICD-10-CM | POA: Diagnosis not present

## 2017-05-02 DIAGNOSIS — I350 Nonrheumatic aortic (valve) stenosis: Secondary | ICD-10-CM | POA: Diagnosis not present

## 2017-05-02 DIAGNOSIS — I10 Essential (primary) hypertension: Secondary | ICD-10-CM

## 2017-05-02 MED ORDER — EZETIMIBE 10 MG PO TABS: 10.0000 mg | ORAL_TABLET | Freq: Every day | ORAL | 3 refills | Status: DC

## 2017-05-02 NOTE — Patient Instructions (Signed)
Medication Instructions:  Your physician has recommended you make the following change in your medication:  START: Zetia 10 mg once a day   If you need a refill on your cardiac medications, please contact your pharmacy first.  Labwork: Your physician recommends that you return for lab work in: 6 weeks for fasting lipids and liver function test   Testing/Procedures: None ordered   Follow-Up: Your physician wants you to follow-up in: 6 months with Dr. Radford Pax. You will receive a reminder letter in the mail two months in advance. If you don't receive a letter, please call our office to schedule the follow-up appointment.  Any Other Special Instructions Will Be Listed Below (If Applicable).   Thank you for choosing [facility], RN  [phone]  If you need a refill on your cardiac medications before your next appointment, please call your pharmacy.

## 2017-05-02 NOTE — Progress Notes (Signed)
Cardiology Office Note:    Date:  05/02/2017   ID:  [name], DOB Dec 19, 1931, MRN [id number]  PCP:  [name], MD  Cardiologist:  No primary care provider on file.    Referring MD: [name], MD   Chief Complaint  Patient presents with  . Atrial Fibrillation  . Coronary Artery Disease  . Hypertension  . Hyperlipidemia    History of Present Illness:    [name] is a 84 y.o. male with a hx of h/o persistent atrial fibrillation. He was previously on amiodarone but this was discontinued due to side effects. Since then, he has been managed with rate control therapy with Cardizem. He is on Coumadin for a/c. His INRs are followed at the [facility]. He also has a h/o CAD s/p CABG as well as h/o mitral valve repair, HTN and diastolic dysfunction. He saw my extender last fall and had stopped his Cardizem due to hypotension and HR was up in the 110's at times. He was just seen in afib clinic and was staretd on Toprol XL 12.5mg  daily but did not tolerate due to fatigue.  He was started on Cardizem SR 60mg  BID but they could not get it due to not in stock and he was changed to [employer].    He is here today for followup and is doing well.  He is tolerating the Bystolic very well.  He denies any chest pain or pressure, SOB, DOE (escept with extreme exertion), PND, orthopnea, LE edema, dizziness, palpitations or syncope. He is compliant with his meds and is tolerating meds with no SE.    Past Medical History:  Diagnosis Date  . Adenomatous colon polyp   . Aortic stenosis    a. mild by echo 2017.  Marland Kitchen CAD (coronary artery disease)    a. S/P CABG x 1 @ time of MV annuloplasty;  b. 02/2010 ETT: neg for ischemia.  . Carotid artery stenosis    1-39% by dopplers 10/2016  . Chronic diastolic CHF (congestive heart failure) ([place])   . CKD (chronic kidney disease), stage III ([place])   . Current use of long term anticoagulation    a. Coumadin in setting of afib.  . Diverticulosis    Colonoscopy  . Dyslipidemia   . Essential hypertension   . H/O mitral valve repair   . Hemorrhoids   . Hyperlipidemia   . Hypertension   . Permanent atrial fibrillation (HCC)    a. CHA2DS2VASc = 6 -->chronic coumadin;  b. Prev on amio-->d/c 2/2 hypothyroidism. >> resumed 5/16. c. Notes from 2017 indicate interstitial lung disease by pulm appt, question amio toxicity, also increased liver attenuation. Rate control pursued and amiodarone discontinued.  Marland Kitchen TIA (transient ischemic attack)     Past Surgical History:  Procedure Laterality Date  . CORONARY ARTERY BYPASS GRAFT    . MITRAL VALVE REPAIR      Current Medications: Current Meds  Medication Sig  . Cholecalciferol (VITAMIN D-3 PO) Take 5,000 Units by mouth daily.   . hydrocortisone (ANUSOL-HC) 2.5 % rectal cream Place 1 application rectally 2 (two) times daily.  Marland Kitchen levothyroxine (SYNTHROID, LEVOTHROID) 50 MCG tablet Take 50 mcg by mouth daily before breakfast.  . NATURAL PSYLLIUM FIBER PO Take 1 Dose by mouth daily as needed (constipation).   . nebivolol (BYSTOLIC) 5 MG tablet Take 1 tablet (5 mg total) by mouth at bedtime.  . WARFARIN SODIUM PO Take 2 mg by mouth as directed. Take by mouth as directed  by the coumadin clinic     Allergies:   Asa [aspirin] and Flomax [tamsulosin]   Social History   Socioeconomic History  . Marital status: Married    Spouse name: None  . Number of children: None  . Years of education: None  . Highest education level: None  Social Needs  . Financial resource strain: None  . Food insecurity - worry: None  . Food insecurity - inability: None  . Transportation needs - medical: None  . Transportation needs - non-medical: None  Occupational History  . 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 Smoking status: Former Smoker    Packs/day: 0.10    Years: 2.00    Pack years: 0.20    Types: Cigarettes    Last attempt to quit: 03/20/1955    Years since quitting: 62.1  .  Smokeless tobacco: Never Used  Substance and Sexual Activity  . Alcohol use: No    Alcohol/week: 0.0 oz  . Drug use: No  . Sexual activity: Not Currently  Other Topics Concern  . None  Social History Narrative  . None     Family History: The patient's family history includes Heart disease in his father; Heart failure in his brother; Stroke in his brother and mother.  ROS:   Please see the history of present illness.    ROS  All other systems reviewed and negative.   EKGs/Labs/Other Studies Reviewed:    The following studies were reviewed today: none  EKG:  EKG is not ordered today.    Recent Labs: 04/23/2017: ALT 19   Recent Lipid Panel    Component Value Date/Time   CHOL 185 04/23/2017 0823   TRIG 117 04/23/2017 0823   HDL 35 (L) 04/23/2017 0823   CHOLHDL 5.3 (H) 04/23/2017 0823   CHOLHDL 2.6 03/16/2016 0903   VLDL 20 03/16/2016 0903   LDLCALC 127 (H) 04/23/2017 0823    Physical Exam:    VS:  BP 130/60 (BP Location: Left Arm, Patient Position: Sitting, Cuff Size: Normal)   Pulse 86   Ht 5\' 8"  (1.727 m)   Wt 201 lb (91.2 kg)   SpO2 99%   BMI 30.56 kg/m     Wt Readings from Last 3 Encounters:  05/02/17 201 lb (91.2 kg)  04/24/17 198 lb 9.6 oz (90.1 kg)  04/16/17 201 lb 3.2 oz (91.3 kg)     GEN:  Well nourished, well developed in no acute distress HEENT: Normal NECK: No JVD; No carotid bruits LYMPHATICS: No lymphadenopathy CARDIAC: irregulalry irregular, no ubs, gallops.  2/6 Sm at RUSB to LLSB RESPIRATORY:  Clear to auscultation without rales, wheezing or rhonchi  ABDOMEN: Soft, non-tender, non-distended MUSCULOSKELETAL:  No edema; No deformity  SKIN: Warm and dry NEUROLOGIC:  Alert and oriented x 3 PSYCHIATRIC:  Normal affect   ASSESSMENT:    1. Permanent atrial fibrillation ([place])   2. Essential hypertension   3. Chronic diastolic heart failure ([place])   4. Coronary artery disease involving native coronary artery of native heart without angina  pectoris   5. Bilateral carotid artery stenosis   6. Nonrheumatic aortic valve stenosis   7. H/O mitral valve repair   8. Dyslipidemia    PLAN:    In order of problems listed above:  1.  Permanent atrial fibrillation - his heart rate is fairly well controlled today.  He will continue on warfarin and BB.    2.  HTN - BP is  well controlled on exam today.  He will continue on Bystolic 5mg  daily.    3.  Chronic diastolic CHF - he appears euvolemic on exam today.    4.  ASCAD - s/p remote CABG.  He denies any anginal symptoms.  He will continue on statin and BB.  He is not on ASA due to warfarin.    5.  Bilateral carotid artery stenosis - dopplers 2018 with 1-39% stenosis.  Repeat dopplers 10/2018.  Continue ASA and statin.   6.  Aortic stenosis- very mild by echo 2017.   7.  Mitral regurgitation s/p MV repair - MV stable by echo 2017 with mild MR.  8.  Hyperlipidemia with LDL goal < 70.  He will continue on pravastatin 80mg  daily.  I have recommended adding Zetia 10mg  daily for recent LDL of 127.    Medication Adjustments/Labs and Tests Ordered: Current medicines are reviewed at length with the patient today.  Concerns regarding medicines are outlined above.  No orders of the defined types were placed in this encounter.  No orders of the defined types were placed in this encounter.   Signed, Fransico Him, MD  05/02/2017 9:32 AM    [place]

## 2017-06-11 ENCOUNTER — Other Ambulatory Visit: Payer: PPO | Admitting: *Deleted

## 2017-06-11 DIAGNOSIS — E785 Hyperlipidemia, unspecified: Secondary | ICD-10-CM | POA: Diagnosis not present

## 2017-06-11 LAB — LIPID PANEL
CHOLESTEROL TOTAL: 197 mg/dL (ref 100–199)
Chol/HDL Ratio: 5.5 ratio — ABNORMAL HIGH (ref 0.0–5.0)
HDL: 36 mg/dL — ABNORMAL LOW (ref >39)
LDL CALC: 138 mg/dL — AB (ref 0–99)
TRIGLYCERIDES: 117 mg/dL (ref 0–149)
VLDL Cholesterol Cal: 23 mg/dL (ref 5–40)

## 2017-06-11 LAB — ALT: ALT: 12 IU/L (ref 0–44)

## 2017-06-17 ENCOUNTER — Other Ambulatory Visit: Payer: PPO | Admitting: *Deleted

## 2017-06-17 DIAGNOSIS — E785 Hyperlipidemia, unspecified: Secondary | ICD-10-CM

## 2017-06-17 LAB — HEPATIC FUNCTION PANEL
ALBUMIN: 4.3 g/dL (ref 3.5–4.7)
ALT: 15 IU/L (ref 0–44)
AST: 18 IU/L (ref 0–40)
Alkaline Phosphatase: 57 IU/L (ref 39–117)
BILIRUBIN TOTAL: 0.7 mg/dL (ref 0.0–1.2)
BILIRUBIN, DIRECT: 0.19 mg/dL (ref 0.00–0.40)
TOTAL PROTEIN: 6.8 g/dL (ref 6.0–8.5)

## 2017-06-17 LAB — LIPID PANEL
CHOL/HDL RATIO: 5.5 ratio — AB (ref 0.0–5.0)
Cholesterol, Total: 193 mg/dL (ref 100–199)
HDL: 35 mg/dL — ABNORMAL LOW (ref >39)
LDL CALC: 135 mg/dL — AB (ref 0–99)
TRIGLYCERIDES: 114 mg/dL (ref 0–149)
VLDL Cholesterol Cal: 23 mg/dL (ref 5–40)

## 2017-06-20 ENCOUNTER — Telehealth: Payer: Self-pay | Admitting: Cardiology

## 2017-06-20 DIAGNOSIS — E785 Hyperlipidemia, unspecified: Secondary | ICD-10-CM

## 2017-06-20 NOTE — Telephone Encounter (Signed)
Left message to call back  

## 2017-06-20 NOTE — Telephone Encounter (Signed)
Notes recorded by Teressa Senter, RN on 06/20/2017 at 2:01 PM EDT I spoke with patient's spouse per patient's request. She states that patient is noncompliant with taking his Zetia and Pravastatin. Patient states he does not feel like he needs to take it. I explained that with his cardiac history it's very important to monitor and control lipids. She states she will encourage patient to take his meds daily. She scheduled an appt on 09/11/17 for follow up labs. She verbalized understanding and thankful for the call.  Notes recorded by Erskine Emery, [place] on 06/19/2017 at 12:40 PM EDT Would see if compliant with pravastatin and ezetimibe therapy since LDL went up after starting ezetimibe? If no then resume and repeat panel in 2-3 months. If he has been compliant would recommend stop pravastatin and start rosuvastatin 40mg  daily. Repeat lipid and hepatic panel in 2-3 months. ------

## 2017-06-20 NOTE — Telephone Encounter (Signed)
Follow Up:   Wife calling back to get pt's lab results,you call yesterday,but pt can not hear well.

## 2017-08-14 DIAGNOSIS — E039 Hypothyroidism, unspecified: Secondary | ICD-10-CM | POA: Diagnosis not present

## 2017-08-14 DIAGNOSIS — E7800 Pure hypercholesterolemia, unspecified: Secondary | ICD-10-CM | POA: Diagnosis not present

## 2017-08-14 DIAGNOSIS — I480 Paroxysmal atrial fibrillation: Secondary | ICD-10-CM | POA: Diagnosis not present

## 2017-08-14 DIAGNOSIS — I10 Essential (primary) hypertension: Secondary | ICD-10-CM | POA: Diagnosis not present

## 2017-08-14 DIAGNOSIS — N183 Chronic kidney disease, stage 3 (moderate): Secondary | ICD-10-CM | POA: Diagnosis not present

## 2017-08-14 DIAGNOSIS — I350 Nonrheumatic aortic (valve) stenosis: Secondary | ICD-10-CM | POA: Diagnosis not present

## 2017-08-14 DIAGNOSIS — I2510 Atherosclerotic heart disease of native coronary artery without angina pectoris: Secondary | ICD-10-CM | POA: Diagnosis not present

## 2017-09-11 ENCOUNTER — Other Ambulatory Visit: Payer: PPO

## 2017-10-18 IMAGING — CT CT CHEST HIGH RESOLUTION W/O CM
2 of 6 series · 15 of 36 positions shown, 18 images · non-contrast
Comparison: Chest CT 09/16/2015.

CLINICAL DATA: 84-year-old male with history of chronic shortness
of breath. Evaluate for potential interstitial lung disease.

EXAM:
CT CHEST WITHOUT CONTRAST
TECHNIQUE: Multidetector CT imaging of the chest was performed following the
standard protocol without intravenous contrast. High resolution
imaging of the lungs, as well as inspiratory and expiratory imaging,
was performed.

[Series 2: high resolution · axial · 0.77mm/px · z∈[-329,-33]mm · 12 of 164 slices shown, 15 images]
[im 8/164  mediastinal]
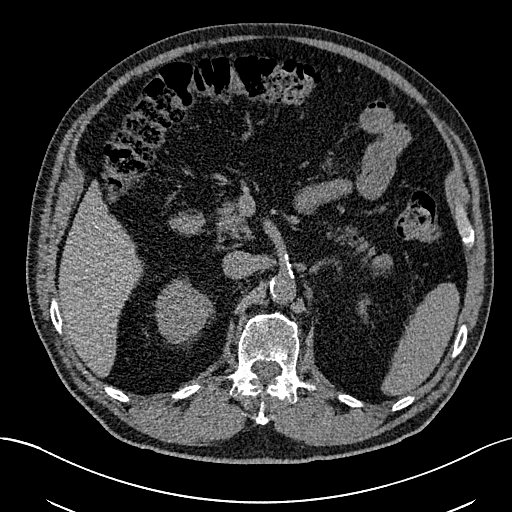
[im 8/164  lung]
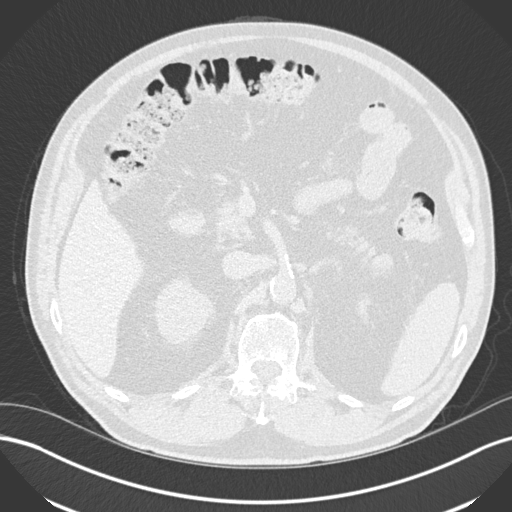
[im 24/164  lung]
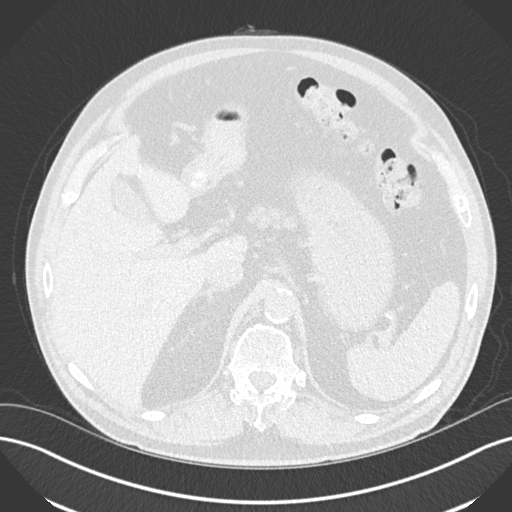
[im 39/164  lung]
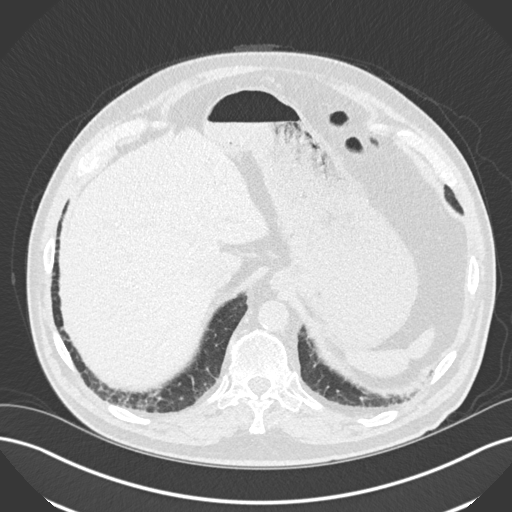
[im 47/164  lung]
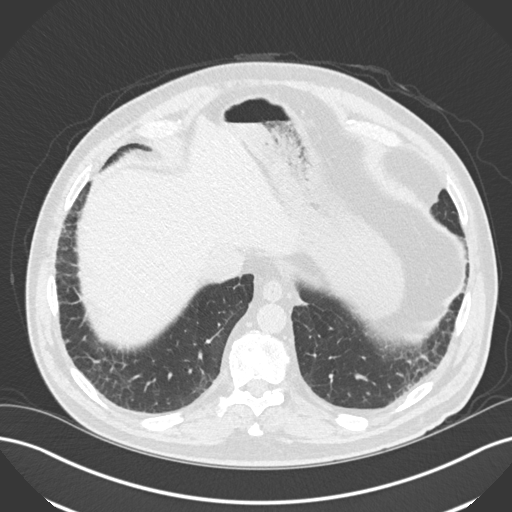
[im 63/164  mediastinal]
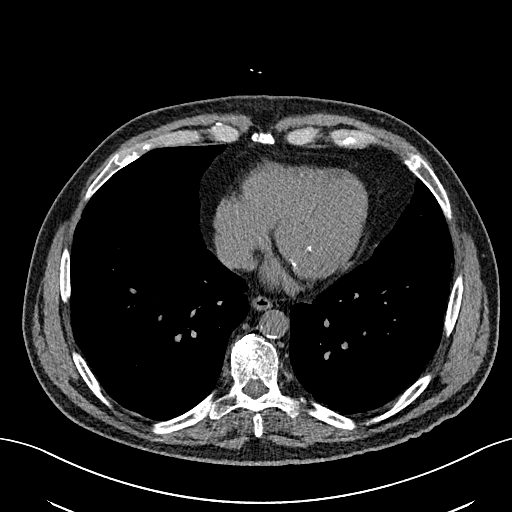
[im 63/164  lung]
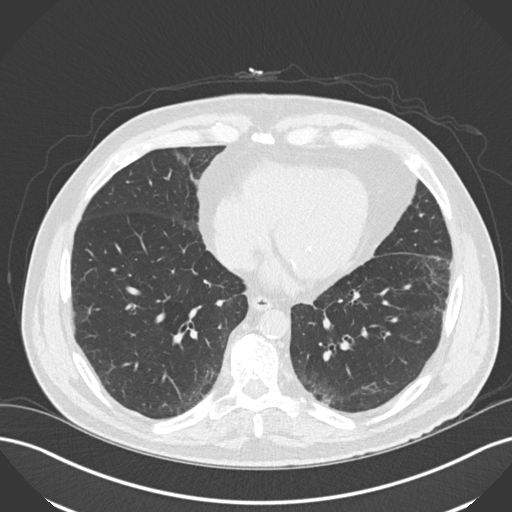
[im 78/164  lung]
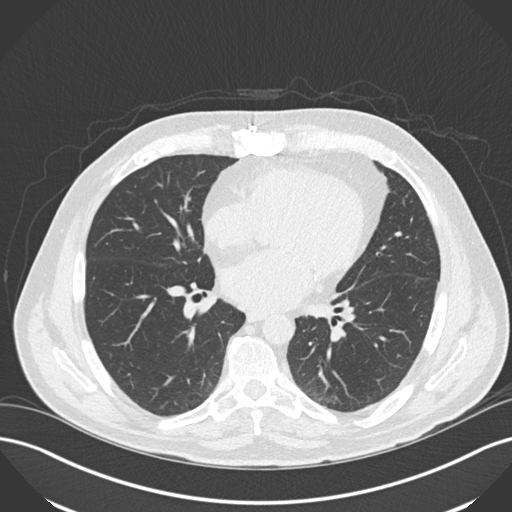
[im 86/164  lung]
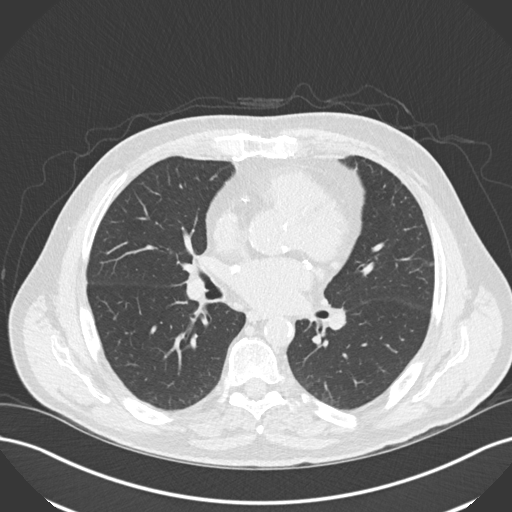
[im 101/164  lung]
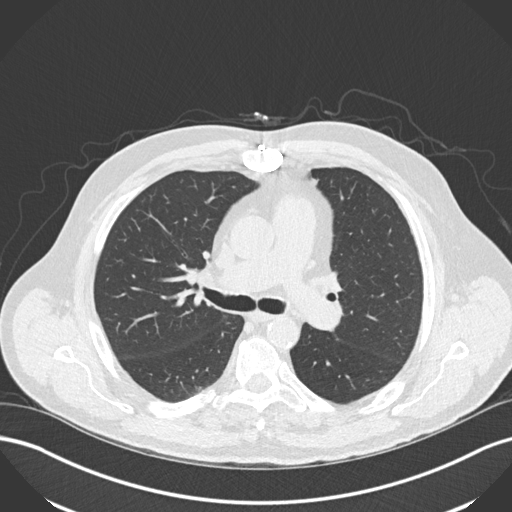
[im 117/164  mediastinal]
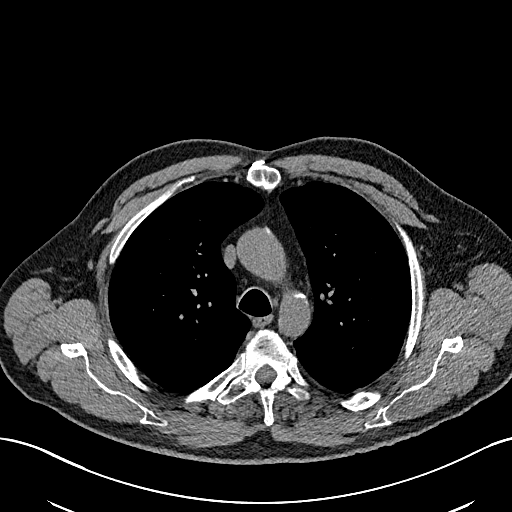
[im 117/164  lung]
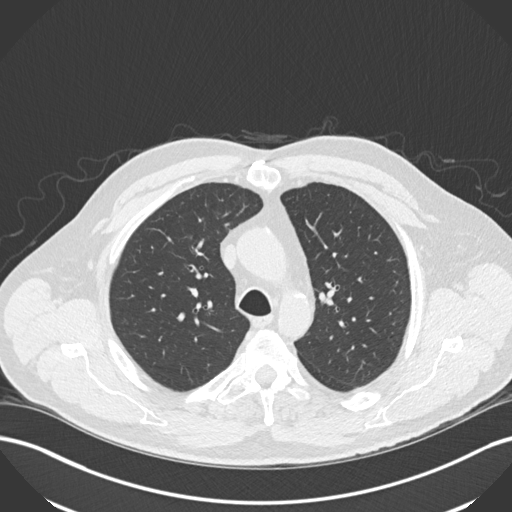
[im 125/164  lung]
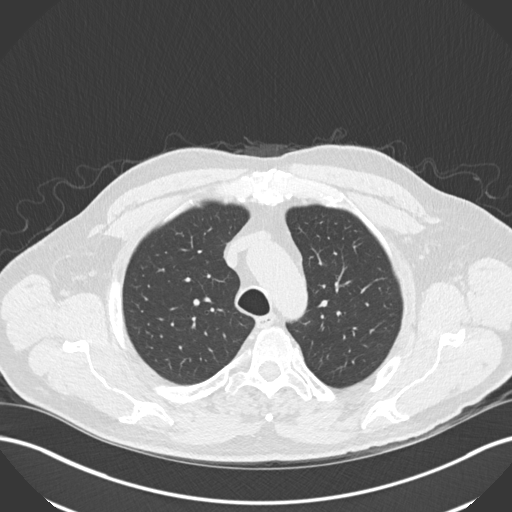
[im 140/164  lung]
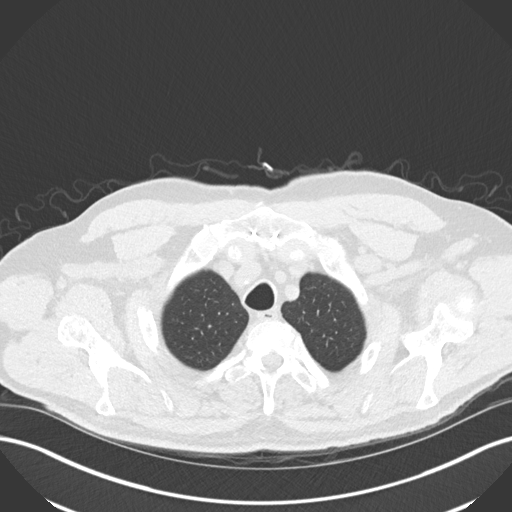
[im 156/164  lung]
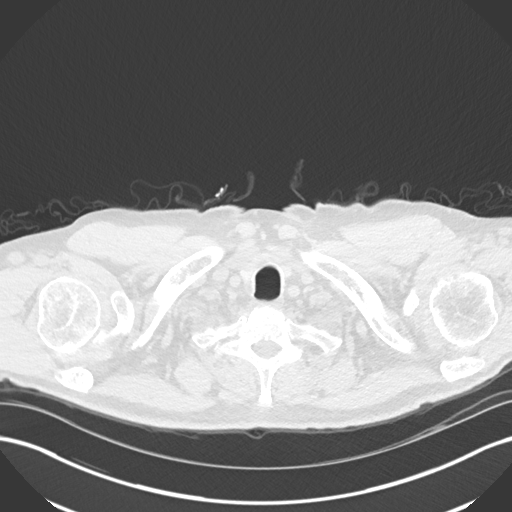

[Series 8: coronal · coronal · 0.64mm/px · 3 of 130 slices shown]
[im 26/130  lung]
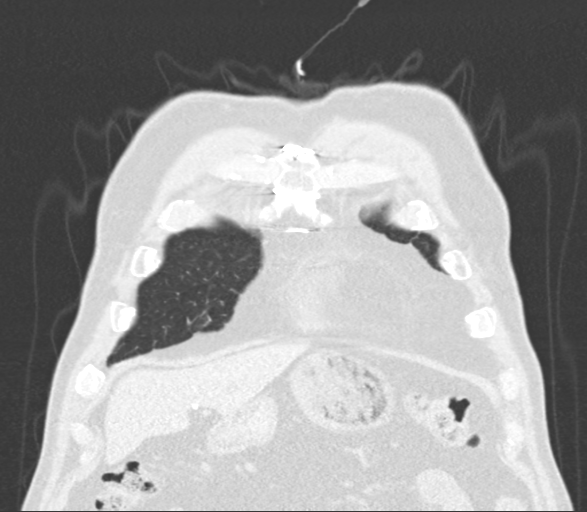
[im 52/130  lung]
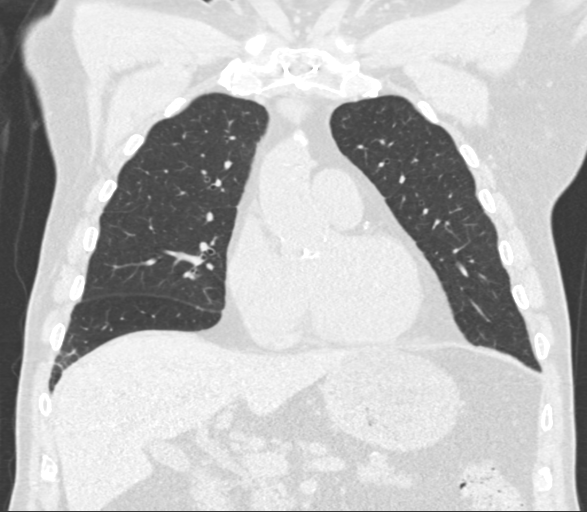
[im 78/130  lung]
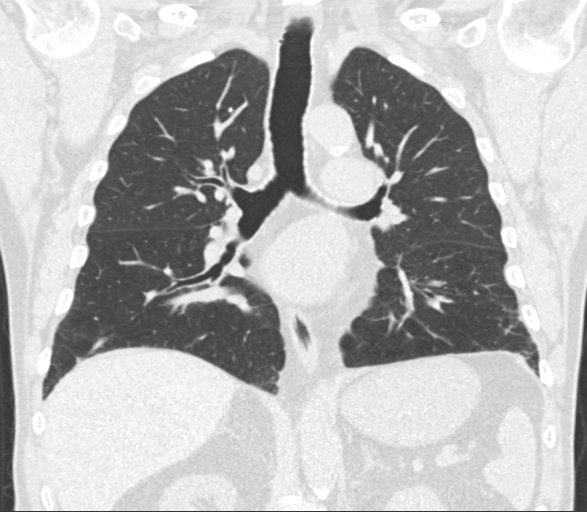

[15 of 36 positions shown; findings below may reference images not displayed]

FINDINGS: Cardiovascular: Heart size is normal. There is no significant
pericardial fluid, thickening or pericardial calcification. There is
aortic atherosclerosis, as well as atherosclerosis of the great
vessels of the mediastinum and the coronary arteries, including
calcified atherosclerotic plaque in the left main, left anterior
descending, left circumflex and right coronary arteries. Status post
median sternotomy for CABG. Extensive calcifications of the aortic
valve.

Mediastinum/Nodes: No pathologically enlarged mediastinal or hilar
lymph nodes. Please note that accurate exclusion of hilar adenopathy
is limited on noncontrast CT scans. Esophagus is unremarkable in
appearance. No axillary lymphadenopathy.

Lungs/Pleura: High-resolution images demonstrates some very mild
areas of ground-glass attenuation and septal thickening throughout
the extreme basal aspects of the lungs bilaterally, predominantly in
the lower lobes. The severity of disease appears essentially
unchanged. Minimal cylindrical traction bronchiectasis is noted in
the basal segments of the lower lobes of the lungs bilaterally. A
small amount of peripheral bronchiolectasis is also noted. No
honeycombing identified. Inspiratory and expiratory imaging
demonstrates moderate air trapping, indicative of small airways
disease. No acute consolidative airspace disease. No pleural
effusions. No suspicious appearing pulmonary nodules or masses.

Upper Abdomen: Aortic atherosclerosis.

Musculoskeletal: There are no aggressive appearing lytic or blastic
lesions noted in the visualized portions of the skeleton. Median
sternotomy wires.
IMPRESSION: 1. The appearance of the chest is compatible with interstitial lung
disease, and given the spectrum of findings and the stability
compared to the prior examination, findings are strongly favored to
reflect nonspecific interstitial pneumonia (NSIP).
2. Aortic atherosclerosis, in addition to left main and 3 vessel
coronary artery disease. Status post median sternotomy for CABG.
3. There are calcifications of the aortic valve. Echocardiographic
correlation for evaluation of potential valvular dysfunction may be
warranted if clinically indicated.

## 2017-11-21 ENCOUNTER — Inpatient Hospital Stay: Admission: RE | Admit: 2017-11-21 | Payer: PPO | Source: Ambulatory Visit

## 2017-11-22 DIAGNOSIS — R109 Unspecified abdominal pain: Secondary | ICD-10-CM | POA: Diagnosis not present

## 2017-12-22 DIAGNOSIS — I4891 Unspecified atrial fibrillation: Secondary | ICD-10-CM | POA: Diagnosis not present

## 2017-12-23 ENCOUNTER — Telehealth: Payer: Self-pay | Admitting: Cardiology

## 2017-12-23 NOTE — Telephone Encounter (Signed)
New Message          Patient's wife is calling today to ask Dr. Radford Pax to release her husband so that he can try another cardiologist please call and advise.

## 2017-12-24 ENCOUNTER — Telehealth: Payer: Self-pay

## 2017-12-24 ENCOUNTER — Inpatient Hospital Stay (HOSPITAL_COMMUNITY)
Admission: EM | Admit: 2017-12-24 | Discharge: 2017-12-27 | DRG: 291 | Disposition: A | Discharge status: Home / Self Care | Payer: PPO | Attending: Internal Medicine | Admitting: Internal Medicine

## 2017-12-24 ENCOUNTER — Ambulatory Visit: Payer: PPO | Admitting: Pulmonary Disease

## 2017-12-24 ENCOUNTER — Other Ambulatory Visit (INDEPENDENT_AMBULATORY_CARE_PROVIDER_SITE_OTHER): Payer: PPO

## 2017-12-24 ENCOUNTER — Encounter (HOSPITAL_COMMUNITY): Payer: Self-pay

## 2017-12-24 ENCOUNTER — Other Ambulatory Visit: Payer: Self-pay

## 2017-12-24 ENCOUNTER — Encounter: Payer: Self-pay | Admitting: Pulmonary Disease

## 2017-12-24 ENCOUNTER — Emergency Department (HOSPITAL_COMMUNITY): Payer: PPO

## 2017-12-24 VITALS — BP 110/66 | HR 111 | Temp 97.6°F | Ht 68.0 in | Wt 192.8 lb

## 2017-12-24 DIAGNOSIS — Z9889 Other specified postprocedural states: Secondary | ICD-10-CM | POA: Diagnosis not present

## 2017-12-24 DIAGNOSIS — Z6827 Body mass index (BMI) 27.0-27.9, adult: Secondary | ICD-10-CM | POA: Diagnosis not present

## 2017-12-24 DIAGNOSIS — I10 Essential (primary) hypertension: Secondary | ICD-10-CM | POA: Diagnosis not present

## 2017-12-24 DIAGNOSIS — I130 Hypertensive heart and chronic kidney disease with heart failure and stage 1 through stage 4 chronic kidney disease, or unspecified chronic kidney disease: Secondary | ICD-10-CM | POA: Diagnosis not present

## 2017-12-24 DIAGNOSIS — I509 Heart failure, unspecified: Secondary | ICD-10-CM

## 2017-12-24 DIAGNOSIS — Z79890 Hormone replacement therapy (postmenopausal): Secondary | ICD-10-CM

## 2017-12-24 DIAGNOSIS — Z886 Allergy status to analgesic agent status: Secondary | ICD-10-CM | POA: Diagnosis not present

## 2017-12-24 DIAGNOSIS — Z79899 Other long term (current) drug therapy: Secondary | ICD-10-CM

## 2017-12-24 DIAGNOSIS — E039 Hypothyroidism, unspecified: Secondary | ICD-10-CM | POA: Diagnosis present

## 2017-12-24 DIAGNOSIS — Z8673 Personal history of transient ischemic attack (TIA), and cerebral infarction without residual deficits: Secondary | ICD-10-CM | POA: Diagnosis not present

## 2017-12-24 DIAGNOSIS — I4891 Unspecified atrial fibrillation: Secondary | ICD-10-CM

## 2017-12-24 DIAGNOSIS — Z87891 Personal history of nicotine dependence: Secondary | ICD-10-CM

## 2017-12-24 DIAGNOSIS — D696 Thrombocytopenia, unspecified: Secondary | ICD-10-CM | POA: Diagnosis not present

## 2017-12-24 DIAGNOSIS — R0609 Other forms of dyspnea: Secondary | ICD-10-CM | POA: Diagnosis not present

## 2017-12-24 DIAGNOSIS — E785 Hyperlipidemia, unspecified: Secondary | ICD-10-CM | POA: Diagnosis not present

## 2017-12-24 DIAGNOSIS — Z951 Presence of aortocoronary bypass graft: Secondary | ICD-10-CM

## 2017-12-24 DIAGNOSIS — I350 Nonrheumatic aortic (valve) stenosis: Secondary | ICD-10-CM | POA: Diagnosis present

## 2017-12-24 DIAGNOSIS — I340 Nonrheumatic mitral (valve) insufficiency: Secondary | ICD-10-CM | POA: Diagnosis not present

## 2017-12-24 DIAGNOSIS — Z888 Allergy status to other drugs, medicaments and biological substances status: Secondary | ICD-10-CM | POA: Diagnosis not present

## 2017-12-24 DIAGNOSIS — I5043 Acute on chronic combined systolic (congestive) and diastolic (congestive) heart failure: Secondary | ICD-10-CM | POA: Diagnosis present

## 2017-12-24 DIAGNOSIS — R000 Tachycardia, unspecified: Secondary | ICD-10-CM | POA: Diagnosis not present

## 2017-12-24 DIAGNOSIS — Z86010 Personal history of colonic polyps: Secondary | ICD-10-CM | POA: Diagnosis not present

## 2017-12-24 DIAGNOSIS — R0902 Hypoxemia: Secondary | ICD-10-CM | POA: Diagnosis not present

## 2017-12-24 DIAGNOSIS — I4821 Permanent atrial fibrillation: Secondary | ICD-10-CM | POA: Diagnosis present

## 2017-12-24 DIAGNOSIS — Z8249 Family history of ischemic heart disease and other diseases of the circulatory system: Secondary | ICD-10-CM

## 2017-12-24 DIAGNOSIS — Z7901 Long term (current) use of anticoagulants: Secondary | ICD-10-CM

## 2017-12-24 DIAGNOSIS — I5021 Acute systolic (congestive) heart failure: Secondary | ICD-10-CM | POA: Diagnosis not present

## 2017-12-24 DIAGNOSIS — Z823 Family history of stroke: Secondary | ICD-10-CM

## 2017-12-24 DIAGNOSIS — N183 Chronic kidney disease, stage 3 (moderate): Secondary | ICD-10-CM | POA: Diagnosis present

## 2017-12-24 DIAGNOSIS — I110 Hypertensive heart disease with heart failure: Secondary | ICD-10-CM | POA: Diagnosis not present

## 2017-12-24 DIAGNOSIS — I2510 Atherosclerotic heart disease of native coronary artery without angina pectoris: Secondary | ICD-10-CM | POA: Diagnosis not present

## 2017-12-24 DIAGNOSIS — E669 Obesity, unspecified: Secondary | ICD-10-CM | POA: Diagnosis not present

## 2017-12-24 DIAGNOSIS — R404 Transient alteration of awareness: Secondary | ICD-10-CM | POA: Diagnosis not present

## 2017-12-24 DIAGNOSIS — J849 Interstitial pulmonary disease, unspecified: Secondary | ICD-10-CM | POA: Diagnosis present

## 2017-12-24 DIAGNOSIS — I5033 Acute on chronic diastolic (congestive) heart failure: Secondary | ICD-10-CM | POA: Diagnosis not present

## 2017-12-24 DIAGNOSIS — R0602 Shortness of breath: Secondary | ICD-10-CM | POA: Diagnosis not present

## 2017-12-24 DIAGNOSIS — J90 Pleural effusion, not elsewhere classified: Secondary | ICD-10-CM | POA: Diagnosis not present

## 2017-12-24 LAB — BASIC METABOLIC PANEL
ANION GAP: 11 (ref 5–15)
BUN: 27 mg/dL — AB (ref 6–23)
BUN: 24 mg/dL — ABNORMAL HIGH (ref 8–23)
CHLORIDE: 104 meq/L (ref 96–112)
CHLORIDE: 104 mmol/L (ref 98–111)
CO2: 26 mEq/L (ref 19–32)
CO2: 22 mmol/L (ref 22–32)
Calcium: 9.6 mg/dL (ref 8.4–10.5)
Calcium: 9.2 mg/dL (ref 8.9–10.3)
Creatinine, Ser: 1.67 mg/dL — ABNORMAL HIGH (ref 0.40–1.50)
Creatinine, Ser: 1.66 mg/dL — ABNORMAL HIGH (ref 0.61–1.24)
GFR calc Af Amer: 42 mL/min — ABNORMAL LOW (ref >60)
GFR calc non Af Amer: 37 mL/min — ABNORMAL LOW (ref >60)
GFR: 41.88 mL/min — ABNORMAL LOW (ref >60.00)
Glucose, Bld: 109 mg/dL — ABNORMAL HIGH (ref 70–99)
Glucose, Bld: 105 mg/dL — ABNORMAL HIGH (ref 70–99)
POTASSIUM: 5 meq/L (ref 3.5–5.1)
POTASSIUM: 4.2 mmol/L (ref 3.5–5.1)
Sodium: 137 mEq/L (ref 135–145)
Sodium: 137 mmol/L (ref 135–145)

## 2017-12-24 LAB — TSH
TSH: 2.95 u[IU]/mL (ref 0.35–4.50)
TSH: 3.558 u[IU]/mL (ref 0.350–4.500)

## 2017-12-24 LAB — CBC
HEMATOCRIT: 41.1 % (ref 39.0–52.0)
HEMATOCRIT: 41.3 % (ref 39.0–52.0)
HEMOGLOBIN: 13.6 g/dL (ref 13.0–17.0)
Hemoglobin: 13.3 g/dL (ref 13.0–17.0)
MCH: 29.8 pg (ref 26.0–34.0)
MCHC: 33.1 g/dL (ref 30.0–36.0)
MCHC: 32.2 g/dL (ref 30.0–36.0)
MCV: 90.6 fl (ref 78.0–100.0)
MCV: 92.6 fL (ref 80.0–100.0)
NRBC: 0 % (ref 0.0–0.2)
Platelets: 126 10*3/uL — ABNORMAL LOW (ref 150.0–400.0)
Platelets: 131 10*3/uL — ABNORMAL LOW (ref 150–400)
RBC: 4.54 Mil/uL (ref 4.22–5.81)
RBC: 4.46 MIL/uL (ref 4.22–5.81)
RDW: 15.5 % (ref 11.5–15.5)
RDW: 15.2 % (ref 11.5–15.5)
WBC: 6.4 10*3/uL (ref 4.0–10.5)
WBC: 5.6 10*3/uL (ref 4.0–10.5)

## 2017-12-24 LAB — URINALYSIS, ROUTINE W REFLEX MICROSCOPIC
BILIRUBIN URINE: NEGATIVE
GLUCOSE, UA: NEGATIVE mg/dL
HGB URINE DIPSTICK: NEGATIVE
KETONES UR: NEGATIVE mg/dL
Leukocytes, UA: NEGATIVE
Nitrite: NEGATIVE
PROTEIN: NEGATIVE mg/dL
Specific Gravity, Urine: 1.016 (ref 1.005–1.030)
pH: 5 (ref 5.0–8.0)

## 2017-12-24 LAB — BRAIN NATRIURETIC PEPTIDE: B NATRIURETIC PEPTIDE 5: 927 pg/mL — AB (ref 0.0–100.0)

## 2017-12-24 LAB — PROTIME-INR
INR: 1.63
PROTHROMBIN TIME: 19.2 s — AB (ref 11.4–15.2)

## 2017-12-24 LAB — I-STAT TROPONIN, ED: Troponin i, poc: 0 ng/mL (ref 0.00–0.08)

## 2017-12-24 LAB — TROPONIN I

## 2017-12-24 LAB — MAGNESIUM: MAGNESIUM: 2 mg/dL (ref 1.7–2.4)

## 2017-12-24 MED ORDER — DILTIAZEM HCL 25 MG/5ML IV SOLN
10.0000 mg | Freq: Once | INTRAVENOUS | Status: AC
  Administered 2017-12-24: 10 mg via INTRAVENOUS
  Filled 2017-12-24: qty 5

## 2017-12-24 MED ORDER — DILTIAZEM HCL-DEXTROSE 100-5 MG/100ML-% IV SOLN (PREMIX)
10.0000 mg/h | INTRAVENOUS | Status: DC
  Administered 2017-12-24: 10 mg/h via INTRAVENOUS
  Filled 2017-12-24: qty 100

## 2017-12-24 MED ORDER — WARFARIN SODIUM 5 MG PO TABS
6.0000 mg | ORAL_TABLET | Freq: Once | ORAL | Status: AC
  Administered 2017-12-25: 6 mg via ORAL
  Filled 2017-12-24: qty 1

## 2017-12-24 MED ORDER — NITROGLYCERIN 2 % TD OINT
1.0000 [in_us] | TOPICAL_OINTMENT | Freq: Once | TRANSDERMAL | Status: AC
  Administered 2017-12-24: 1 [in_us] via TOPICAL
  Filled 2017-12-24: qty 1

## 2017-12-24 MED ORDER — ONDANSETRON HCL 4 MG/2ML IJ SOLN: 4.0000 mg | Freq: Four times a day (QID) | INTRAMUSCULAR | Status: DC | PRN

## 2017-12-24 MED ORDER — ONDANSETRON HCL 4 MG PO TABS: 4.0000 mg | ORAL_TABLET | Freq: Four times a day (QID) | ORAL | Status: DC | PRN

## 2017-12-24 MED ORDER — WARFARIN - PHARMACIST DOSING INPATIENT: Freq: Every day | Status: DC

## 2017-12-24 MED ORDER — PRAVASTATIN SODIUM 40 MG PO TABS
80.0000 mg | ORAL_TABLET | Freq: Every evening | ORAL | Status: DC
  Administered 2017-12-24 – 2017-12-26 (×3): 80 mg via ORAL
  Filled 2017-12-24 (×3): qty 2

## 2017-12-24 MED ORDER — LEVOTHYROXINE SODIUM 50 MCG PO TABS
50.0000 ug | ORAL_TABLET | Freq: Every day | ORAL | Status: DC
  Administered 2017-12-26 – 2017-12-27 (×2): 50 ug via ORAL
  Filled 2017-12-24 (×2): qty 1

## 2017-12-24 MED ORDER — FUROSEMIDE 10 MG/ML IJ SOLN
20.0000 mg | Freq: Once | INTRAMUSCULAR | Status: AC
  Administered 2017-12-24: 20 mg via INTRAVENOUS
  Filled 2017-12-24: qty 2

## 2017-12-24 MED ORDER — DILTIAZEM HCL-DEXTROSE 100-5 MG/100ML-% IV SOLN (PREMIX)
10.0000 mg/h | Freq: Once | INTRAVENOUS | Status: AC
  Administered 2017-12-24: 5 mg/h via INTRAVENOUS
  Filled 2017-12-24: qty 100

## 2017-12-24 MED ORDER — NEBIVOLOL HCL 5 MG PO TABS
5.0000 mg | ORAL_TABLET | Freq: Every day | ORAL | Status: DC
  Administered 2017-12-24: 5 mg via ORAL
  Filled 2017-12-24: qty 1

## 2017-12-24 MED ORDER — NITROGLYCERIN 2 % TD OINT
1.0000 [in_us] | TOPICAL_OINTMENT | Freq: Once | TRANSDERMAL | Status: DC
  Filled 2017-12-24: qty 30

## 2017-12-24 MED ORDER — ACETAMINOPHEN 650 MG RE SUPP: 650.0000 mg | Freq: Four times a day (QID) | RECTAL | Status: DC | PRN

## 2017-12-24 MED ORDER — ACETAMINOPHEN 325 MG PO TABS
650.0000 mg | ORAL_TABLET | Freq: Four times a day (QID) | ORAL | Status: DC | PRN
  Administered 2017-12-24: 650 mg via ORAL
  Filled 2017-12-24: qty 2

## 2017-12-24 NOTE — Telephone Encounter (Signed)
He does not need discharge from practice to see pulmonary - I'm a little confused what answers were they trying to get - he had no complaints when I saw him last

## 2017-12-24 NOTE — ED Provider Notes (Addendum)
[facility] EMERGENCY DEPARTMENT Provider Note   CSN: [id number] Arrival date & time: 12/24/17  1740     History   Chief Complaint Chief Complaint  Patient presents with  . Atrial Fibrillation    HPI [name] is a 84 y.o. male.  HPI Complains of generalized weakness dry cough for 1 week.  Denies any fever.  No treatment prior to coming here.  Nothing makes symptoms better or worse.  Denies any chest pain.  Seen by pulmonary nurse practitioner today and sent home.  He reports that he felt worse after going home.  He was weak to the point where he could not stand up.  No other associated symptoms.  Domino pain no blood per rectum no chest pain no shortness of breath. Past Medical History:  Diagnosis Date  . Adenomatous colon polyp   . Aortic stenosis    a. mild by echo 2017.  Marland Kitchen CAD (coronary artery disease)    a. S/P CABG x 1 @ time of MV annuloplasty;  b. 02/2010 ETT: neg for ischemia.  . Carotid artery stenosis    1-39% by dopplers 10/2016  . Chronic diastolic CHF (congestive heart failure) ([place])   . CKD (chronic kidney disease), stage III ([place])   . Current use of long term anticoagulation    a. Coumadin in setting of afib.  . Diverticulosis    Colonoscopy  . Dyslipidemia   . Essential hypertension   . H/O mitral valve repair   . Hemorrhoids   . Hyperlipidemia   . Hypertension   . Permanent atrial fibrillation    a. CHA2DS2VASc = 6 -->chronic coumadin;  b. Prev on amio-->d/c 2/2 hypothyroidism. >> resumed 5/16. c. Notes from 2017 indicate interstitial lung disease by pulm appt, question amio toxicity, also increased liver attenuation. Rate control pursued and amiodarone discontinued.  Marland Kitchen TIA (transient ischemic attack)     Patient Active Problem List   Diagnosis Date Noted  . Dyspnea on exertion and orthopnea 12/24/2017  . ILD (interstitial lung disease) ([place]) 05/25/2016  . External hemorrhoids 08/02/2015  . Aortic stenosis 07/08/2015  . Long  term (current) use of anticoagulants [Z79.01] 07/08/2015  . Carotid artery stenosis 11/22/2014  . Rectal bleeding 04/13/2014  . Chronic anticoagulation 04/13/2014  . Chronic kidney disease (CKD), stage III (moderate) ([place]) 03/02/2014  . CAD (coronary artery disease), native coronary artery 03/02/2014  . H/O mitral valve repair 02/07/2014  . Permanent atrial fibrillation 02/07/2014  . Chronic diastolic heart failure ([place]) 02/07/2014  . Essential hypertension 02/07/2014  . Dyslipidemia 02/07/2014  . Current use of long term anticoagulation 02/07/2014  . Generalized weakness 02/07/2014    Past Surgical History:  Procedure Laterality Date  . CORONARY ARTERY BYPASS GRAFT    . MITRAL VALVE REPAIR          Home Medications    Prior to Admission medications   Medication Sig Start Date End Date Taking? Authorizing Provider  Cholecalciferol (VITAMIN D-3 PO) Take 5,000 Units by mouth daily.     [provider]  hydrocortisone (ANUSOL-HC) 2.5 % rectal cream Place 1 application rectally 2 (two) times daily. 08/02/15   Zehr, Laban Emperor, PA-C  levothyroxine (SYNTHROID, LEVOTHROID) 50 MCG tablet Take 50 mcg by mouth daily before breakfast.    [provider]  NATURAL PSYLLIUM FIBER PO Take 1 Dose by mouth daily as needed (constipation).     [provider]  nebivolol (BYSTOLIC) 5 MG tablet Take 1 tablet (5 mg  total) by mouth at bedtime. Patient not taking: Reported on 12/24/2017 04/26/17   Sherran Needs, NP  pravastatin (PRAVACHOL) 80 MG tablet Take 1 tablet (80 mg total) by mouth every evening. 02/28/17 02/28/18  Sueanne Margarita, MD  WARFARIN SODIUM PO Take 2 mg by mouth as directed. Take by mouth as directed by the coumadin clinic    [provider]    Family History Family History  Problem Relation Age of Onset  . Stroke Mother        Deceased  . Heart disease Father        Deceased  . Heart failure Brother        Deceased  . Stroke Brother      Social History Social History   Tobacco Use  . Smoking status: Former Smoker    Packs/day: 0.10    Years: 2.00    Pack years: 0.20    Types: Cigarettes    Last attempt to quit: 03/20/1955    Years since quitting: 62.8  . Smokeless tobacco: Never Used  Substance Use Topics  . Alcohol use: No    Alcohol/week: 0.0 standard drinks  . Drug use: No     Allergies   Asa [aspirin] and Flomax [tamsulosin]   Review of Systems Review of Systems  Constitutional: Negative.   HENT: Negative.   Respiratory: Positive for cough.   Cardiovascular: Negative.   Gastrointestinal: Positive for nausea.  Musculoskeletal: Negative.   Skin: Negative.   Neurological: Positive for weakness.  Hematological: Bruises/bleeds easily.  Psychiatric/Behavioral: Negative.   All other systems reviewed and are negative.    Physical Exam Updated Vital Signs There were no vitals taken for this visit.  Physical Exam  Constitutional: He appears well-developed and well-nourished. No distress.  HENT:  Head: Normocephalic and atraumatic.  Eyes: Pupils are equal, round, and reactive to light. Conjunctivae are normal.  Neck: Neck supple. JVD present. No tracheal deviation present. No thyromegaly present.  Cardiovascular: Intact distal pulses.  No murmur heard. Irregularly irregular, mildly tachycardic  Pulmonary/Chest: Effort normal. He has rales.  Rales at bases bilaterally  Abdominal: Soft. Bowel sounds are normal. He exhibits no distension. There is no tenderness.  Obese  Musculoskeletal: Normal range of motion. He exhibits edema. He exhibits no tenderness.  Trace pretibial pitting edema  Neurological: He is alert. Coordination normal.  Skin: Skin is warm and dry. Capillary refill takes less than 2 seconds. No rash noted.  Psychiatric: He has a normal mood and affect.  Nursing note and vitals reviewed.    ED Treatments / Results  Labs (all labs ordered are listed, but only abnormal results  are displayed) Labs Reviewed  BASIC METABOLIC PANEL  CBC  BRAIN NATRIURETIC PEPTIDE  PROTIME-INR  I-STAT TROPONIN, ED    EKG EKG Interpretation  Date/Time:  Tuesday December 24 2017 17:48:14 EDT Ventricular Rate:  119 PR Interval:    QRS Duration: 112 QT Interval:  382 QTC Calculation: 537 R Axis:   90 Text Interpretation:  Atrial fibrillation with rapid ventricular response Rightward axis Septal infarct , age undetermined Prolonged QT Abnormal ECG No significant change since last tracing Confirmed by Orlie Dakin [phone]) on 12/24/2017 6:18:16 PM   Radiology No results found.  Procedures Procedures (including critical care time)  Medications Ordered in ED Medications - No data to display   Initial Impression / Assessment and Plan / ED Course  I have reviewed the triage vital signs and the nursing notes.  Pertinent labs &  imaging results that were available during my care of the patient were reviewed by me and considered in my medical decision making (see chart for details).     I consulted with [place] cardiology service will see patient in the hospital.  He suggested arrangement for overnight stay with hospitalist service.  Also IV Lasix, IV Cardizem.  I added nitroglycerin paste to treat for congestive heart failure.  Lab work consistent with congestive heart failure and chronic renal insufficiency.  At 9:05 PM patient resting comfortably.  No distress.  Heart rate 125 beats minute after treatment with intravenous Cardizem, Lasix, nitroglycerin.  Intravenous Cardizem drip ordered for rate control  Chest x-ray viewed by me  I consulted Dr.Kakrakandy from hospitalist service will arrange for overnight stay Final Clinical Impressions(s) / ED Diagnoses  Diagnoses #1 congestive heart failure, acute on chronic #2 chronic renal insufficiency #3 generalized weakness #4 atrial fibrillation with rapid ventricular response Final diagnoses:  None  CRITICAL  CARE Performed by: Orlie Dakin Total critical care time: 30 minutes Critical care time was exclusive of separately billable procedures and treating other patients. Critical care was necessary to treat or prevent imminent or life-threatening deterioration. Critical care was time spent personally by me on the following activities: development of treatment plan with patient and/or surrogate as well as nursing, discussions with consultants, evaluation of patient's response to treatment, examination of patient, obtaining history from patient or surrogate, ordering and performing treatments and interventions, ordering and review of laboratory studies, ordering and review of radiographic studies, pulse oximetry and re-evaluation of patient's condition.  ED Discharge Orders    None       Orlie Dakin, MD 12/24/17 [id number]    Orlie Dakin, MD 12/24/17 Rosey Bath    Orlie Dakin, MD 12/24/17 2255

## 2017-12-24 NOTE — ED Notes (Addendum)
Pt given sandwich and 8 oz sprite.

## 2017-12-24 NOTE — Progress Notes (Signed)
@Patient  ID: [name], male    DOB: Apr 26, 1931, 84 y.o.   MRN: [id number]  Chief Complaint  Patient presents with  . Acute Visit    Referring provider: [name], MD  HPI:  84 year old male with undifferentiated interstitial lung disease stable since 2015.  Clinically deemed unrelated to amiodarone taken briefly in 2017.  PMH:  Smoker/ Smoking History: Former Smoker. 0.20 pack year history.  Maintenance:   [name]   Recent [facility] Encounters:   Patient last seen in our office in September/2018 with Dr. Chase Caller. Patient continues to exercise daily and lift weights.  He does not have any dyspnea on exertion.  Patient would not like lung biopsy.  He is on basic supportive care with observational therapy.  Patient would not like a 35-month follow-up instead would like to follow-up in 1 year.  Pulmonary function test in September/2018 are stable.  Walking desaturation was able to complete 3 laps on 96% room air.  Patient's heart rate did increase to 130 with walk. Plan: Dr. Chase Caller recommended six-month follow-up the patient would like a 1 year follow-up, high-dose flu vaccine at the [place], complete high-res CT in 1 year, in 1 year do pre-bronchodilator spirometry and DLCO only   12/24/2017  - Visit   84 year old male patient presenting today for acute visit.  Patient reports that daughter had upper respiratory infection and patient had similar symptoms.  Patient reports that since then patient has had increased shortness of breath and dyspnea.  Patient reports increased orthopnea.  Patient denies increased weight gain recently.  Patient is not on any fluid pills patient reports that he took this back in 2017 but no longer needed them and they were stopped.  Patient reports that the [place] told him that he is chronic kidney disease stage III.  Patient denies any drops in oxygenation.  Patient has a personal pulse oximeter at home and reports that oxygenation  today was 96%.  Patient was initially supposed to follow-up with our office in September/2019 with a breathing test as well as high-res CT.  Patient canceled all of those appointments as he was doing okay and feeling well.  Patient with known chronic A. fib.  Patient is on Coumadin.  This is managed by the [place].  Patient reporting that he has had increased heart palpitations recently.   Tests:   09/17/2015-CT chest high-res- basilar predominant patchy subpleural reticulation in both lungs no significant traction bronchiectasis or frank honeycombing.  No definite change since 02/07/2014.  Findings most consistent with NSIP. 05/15/2016-CT chest high-res- appearance of chest is compatible with ILD, strongly favored to reflect NSIP  07/21/2015-echocardiogram-LV ejection fraction 55 to 01%, grade 3 diastolic dysfunction  FENO:  No results found for: NITRICOXIDE  PFT: PFT Results Latest Ref Rng & Units 11/21/2016 04/02/2016 08/31/2015  FVC-Pre L 3.08 3.05 3.24  FVC-Predicted Pre % 82 80 85  FVC-Post L - 2.99 3.24  FVC-Predicted Post % - 79 85  Pre FEV1/FVC % % 81 82 81  Post FEV1/FCV % % - 86 84  FEV1-Pre L 2.49 2.50 2.61  FEV1-Predicted Pre % 94 94 97  DLCO UNC% % 78 68 75  DLCO COR %Predicted % 103 90 100    Imaging: No results found.     Specialty Problems      Pulmonary Problems   ILD (interstitial lung disease) ([place])    09/17/2015-CT chest high-res- basilar predominant patchy subpleural reticulation in both lungs no significant traction bronchiectasis  or frank honeycombing.  No definite change since 02/07/2014.  Findings most consistent with NSIP. 05/15/2016-CT chest high-res- appearance of chest is compatible with ILD, strongly favored to reflect NSIP  PFT Results Latest Ref Rng & Units 11/21/2016 04/02/2016 08/31/2015  FVC-Pre L 3.08 3.05 3.24  FVC-Predicted Pre % 82 80 85  FVC-Post L - 2.99 3.24  FVC-Predicted Post % - 79 85  Pre FEV1/FVC % % 81 82 81  Post FEV1/FCV % % - 86 84    FEV1-Pre L 2.49 2.50 2.61  FEV1-Predicted Pre % 94 94 97  DLCO UNC% % 78 68 75  DLCO COR %Predicted % 103 90 100   12/24/17>>> ordered high-res CT>>> 12/24/17>>> ordered pre bd spirometry with DLCO>>>      Dyspnea on exertion and orthopnea    09/17/2015-CT chest high-res- basilar predominant patchy subpleural reticulation in both lungs no significant traction bronchiectasis or frank honeycombing.  No definite change since 02/07/2014.  Findings most consistent with NSIP. 05/15/2016-CT chest high-res- appearance of chest is compatible with ILD, strongly favored to reflect NSIP  07/21/2015-echocardiogram-LV ejection fraction 55 to 69%, grade 3 diastolic dysfunction  62/9/52>>>WUXLKG, bMet, CBC, TSH>>> 12/24/17>>> ordered high-res CT>>> 12/24/17>>> ordered pre bd spirometry with DLCO>>>         Allergies  Allergen Reactions  . Asa [Aspirin]     Bleeding  . Flomax [Tamsulosin]     Made his heart go out of rhythm    Immunization History  Administered Date(s) Administered  . Influenza Split 02/09/2015  . Influenza, High Dose Seasonal PF 12/18/2015  . Pneumococcal Conjugate-13 03/20/2015   >>> Patient to get flu vaccine at the [facility]  Past Medical History:  Diagnosis Date  . Adenomatous colon polyp   . Aortic stenosis    a. mild by echo 2017.  Marland Kitchen CAD (coronary artery disease)    a. S/P CABG x 1 @ time of MV annuloplasty;  b. 02/2010 ETT: neg for ischemia.  . Carotid artery stenosis    1-39% by dopplers 10/2016  . Chronic diastolic CHF (congestive heart failure) ([place])   . CKD (chronic kidney disease), stage III ([place])   . Current use of long term anticoagulation    a. Coumadin in setting of afib.  . Diverticulosis    Colonoscopy  . Dyslipidemia   . Essential hypertension   . H/O mitral valve repair   . Hemorrhoids   . Hyperlipidemia   . Hypertension   . Permanent atrial fibrillation    a. CHA2DS2VASc = 6 -->chronic coumadin;  b. Prev on amio-->d/c 2/2 hypothyroidism. >> resumed  5/16. c. Notes from 2017 indicate interstitial lung disease by pulm appt, question amio toxicity, also increased liver attenuation. Rate control pursued and amiodarone discontinued.  Marland Kitchen TIA (transient ischemic attack)     Tobacco History: Social History   Tobacco Use  Smoking Status Former Smoker  . Packs/day: 0.10  . Years: 2.00  . Pack years: 0.20  . Types: Cigarettes  . Last attempt to quit: 03/20/1955  . Years since quitting: 62.8  Smokeless Tobacco Never Used   Counseling given: Not Answered  Continue to not smoke  Outpatient Encounter Medications as of 12/24/2017  Medication Sig  . Cholecalciferol (VITAMIN D-3 PO) Take 5,000 Units by mouth daily.   . hydrocortisone (ANUSOL-HC) 2.5 % rectal cream Place 1 application rectally 2 (two) times daily.  Marland Kitchen levothyroxine (SYNTHROID, LEVOTHROID) 50 MCG tablet Take 50 mcg by mouth daily before breakfast.  . NATURAL PSYLLIUM FIBER PO Take  1 Dose by mouth daily as needed (constipation).   . pravastatin (PRAVACHOL) 80 MG tablet Take 1 tablet (80 mg total) by mouth every evening.  . WARFARIN SODIUM PO Take 2 mg by mouth as directed. Take by mouth as directed by the coumadin clinic  . nebivolol (BYSTOLIC) 5 MG tablet Take 1 tablet (5 mg total) by mouth at bedtime. (Patient not taking: Reported on 12/24/2017)  . [DISCONTINUED] ezetimibe (ZETIA) 10 MG tablet Take 1 tablet (10 mg total) by mouth daily. (Patient not taking: Reported on 12/24/2017)   No facility-administered encounter medications on file as of 12/24/2017.      Review of Systems  Review of Systems  Constitutional: Positive for fatigue. Negative for activity change, appetite change, chills, fever and unexpected weight change (Denies).  HENT: Negative for postnasal drip, rhinorrhea, sinus pressure, sinus pain, sneezing and sore throat.   Eyes: Negative.   Respiratory: Positive for cough and shortness of breath. Negative for wheezing.   Cardiovascular: Positive for palpitations and  leg swelling (Chronic). Negative for chest pain.  Gastrointestinal: Negative for constipation, diarrhea, nausea and vomiting.  Endocrine: Negative.   Musculoskeletal: Negative.   Skin: Negative.   Neurological: Negative for dizziness and headaches.  Psychiatric/Behavioral: Positive for sleep disturbance (Unable to lie flat when sleeping, patient reports he wakes up often is not able to get a restful sleep). Negative for dysphoric mood. The patient is not nervous/anxious.   All other systems reviewed and are negative.    Physical Exam  BP 110/66 (BP Location: Left Arm, Cuff Size: Normal)   Pulse (!) 111   Temp 97.6 F (36.4 C) (Oral)   Ht 5\' 8"  (1.727 m)   Wt 192 lb 12.8 oz (87.5 kg)   SpO2 96%   BMI 29.32 kg/m   Wt Readings from Last 5 Encounters:  12/24/17 192 lb 12.8 oz (87.5 kg)  05/02/17 201 lb (91.2 kg)  04/24/17 198 lb 9.6 oz (90.1 kg)  04/16/17 201 lb 3.2 oz (91.3 kg)  04/04/17 200 lb 6.4 oz (90.9 kg)     Physical Exam  Constitutional: He is oriented to person, place, and time and well-developed, well-nourished, and in no distress. No distress.  HENT:  Head: Normocephalic and atraumatic.  Right Ear: Hearing and external ear normal.  Left Ear: Hearing and external ear normal.  Nose: Nose normal. Right sinus exhibits no maxillary sinus tenderness and no frontal sinus tenderness. Left sinus exhibits no maxillary sinus tenderness and no frontal sinus tenderness.  Mouth/Throat: Uvula is midline and oropharynx is clear and moist. No oropharyngeal exudate.  Bilateral hearing aids in place  Eyes: Pupils are equal, round, and reactive to light.  Neck: Normal range of motion. Neck supple. No JVD present.  Cardiovascular: Normal heart sounds. A regularly irregular rhythm present. Tachycardia present.  Pulmonary/Chest: Effort normal and breath sounds normal. No accessory muscle usage. No respiratory distress. He has no decreased breath sounds. He has no wheezes. He has no  rhonchi.  Abdominal: Soft. Bowel sounds are normal. There is no tenderness.  Musculoskeletal: Normal range of motion. He exhibits edema (1-2+ edema bilaterally in lower extremities).  Lymphadenopathy:    He has no cervical adenopathy.  Neurological: He is alert and oriented to person, place, and time. Gait normal.  Skin: Skin is warm and dry. He is not diaphoretic. No erythema.  Psychiatric: Mood, memory, affect and judgment normal.  Nursing note and vitals reviewed.     Lab Results:  CBC    Component  Value Date/Time   WBC 6.1 09/09/2014 1623   RBC 5.05 09/09/2014 1623   HGB 15.5 09/09/2014 1623   HCT 46.0 09/09/2014 1623   PLT 149.0 (L) 09/09/2014 1623   MCV 91.1 09/09/2014 1623   MCH 30.8 02/07/2014 0606   MCHC 33.7 09/09/2014 1623   RDW 14.6 09/09/2014 1623   LYMPHSABS 1.1 02/07/2014 0606   MONOABS 0.6 02/07/2014 0606   EOSABS 0.3 02/07/2014 0606   BASOSABS 0.0 02/07/2014 0606    BMET    Component Value Date/Time   NA 138 12/23/2015 0742   K 5.1 12/23/2015 0742   CL 102 12/23/2015 0742   CO2 28 12/23/2015 0742   GLUCOSE 106 (H) 12/23/2015 0742   BUN 22 12/23/2015 0742   CREATININE 1.80 (H) 12/23/2015 0742   CALCIUM 9.5 12/23/2015 0742   GFRNONAA 34 (L) 02/09/2014 0458   GFRAA 39 (L) 02/09/2014 0458    BNP No results found for: BNP  ProBNP    Component Value Date/Time   PROBNP 198.9 02/06/2014 1800      Assessment & Plan:   Pleasant 84 year old patient seen for acute visit today.  Will order high-res CT for follow-up for his ILD.  This was supposed to be completed in September/2019.  We will also order pre-bronchodilator spirometry with DLCO for patient to complete at 4 to 6-week follow-up visit.  We will do lab work today to ensure the patient is not retaining fluid.  We will also check a TSH just to ensure that thyroid is functioning well.  If levothyroxine needs adjustment patient knows they will need to follow-up with PCP.  We will have [facility] heart palpitations to see if they want to start locations to manage this.  Patient had been on amiodarone in the past.  Suggest beta-blocker.  Walk today in office patient was able to complete 2 laps.  Patient was short of breath.  But oxygenation did not drop below 98%.  Patient's heart rate ranged in the 57 to 136.  If patient is retaining a significant amount of fluid could consider repeat echocardiogram.  Echocardiogram in 2017 showed grade 3 diastolic dysfunction.  ILD (interstitial lung disease) ([place]) Walk today in office on room air  We will order high-res CT to follow your NSIP and your interstitial lung disease  We will order spirometry with DLCO  Follow-up in 4 to 6 weeks with Dr. Chase Caller   Dyspnea on exertion and orthopnea Walk today in office on room air  We will order high-res CT to follow your NSIP and your interstitial lung disease  We will order spirometry with DLCO  Follow-up in 4 to 6 weeks with Dr. Chase Caller  Lab work >>>TSH, bmet, CBC, proBNP  Okay to get flu vaccine at [facility]  Permanent atrial fibrillation ([place]) Continue Coumadin Follow-up with PCP as well as the [place] in regards to your heart rate  TSH today     Lauraine Rinne, NP 12/24/2017

## 2017-12-24 NOTE — Patient Instructions (Addendum)
Walk today in office on room air  We will order high-res CT to follow your NSIP and your interstitial lung disease  We will order spirometry with DLCO  Follow-up in 4 to 6 weeks with Dr. Chase Caller  Lab work >>>TSH, bmet, CBC, proBNP  Okay to get flu vaccine at [place]  It is flu season:   >>>Remember to be washing your hands regularly, using hand sanitizer, be careful to use around herself with has contact with people who are sick will increase her chances of getting sick yourself. >>> Best ways to protect herself from the flu: Receive the yearly flu vaccine, practice good hand hygiene washing with soap and also using hand sanitizer when available, eat a nutritious meals, get adequate rest, hydrate appropriately   Please contact the office if your symptoms worsen or you have concerns that you are not improving.   Thank you for choosing [facility] for your healthcare, and for allowing [place] to partner with you on your healthcare journey. I am thankful to be able to provide care to you today.   Wyn Quaker FNP-C

## 2017-12-24 NOTE — Assessment & Plan Note (Signed)
Walk today in office on room air  We will order high-res CT to follow your NSIP and your interstitial lung disease  We will order spirometry with DLCO  Follow-up in 4 to 6 weeks with Dr. Chase Caller

## 2017-12-24 NOTE — Assessment & Plan Note (Signed)
Continue Coumadin Follow-up with PCP as well as the [place] in regards to your heart rate  TSH today

## 2017-12-24 NOTE — Plan of Care (Signed)
  Problem: Education: Goal: Knowledge of General Education information will improve Description Including pain rating scale, medication(s)/side effects and non-pharmacologic comfort measures Outcome: Completed/Met

## 2017-12-24 NOTE — ED Triage Notes (Signed)
Pt from home; called out for sob, heart racing; found to be in afib 90-150; hx of afib; dry cough x 1 week; went to [place] this am for same; pt says symptoms worsening, wants to be evaluated at [facility]; also c/o loss of appetite and unable to sleep due to sob; stroke screen neg; hx cardiac bypass, TIA, takes warfarin  102/84 P 118 irreg RR 24 96% 2L CBG 124

## 2017-12-24 NOTE — Progress Notes (Signed)
ANTICOAGULATION CONSULT NOTE - Initial Consult  Pharmacy Consult for Warfarin  Indication: atrial fibrillation  Allergies  Allergen Reactions  . Asa [Aspirin] Other (See Comments)    Bleeding- has internal hemorrhoids  . Flomax [Tamsulosin] Other (See Comments)    Made his heart go out of rhythm    Patient Measurements: Height: 5\' 8"  (172.7 cm) Weight: 189 lb 14.4 oz (86.1 kg) IBW/kg (Calculated) : 68.4  Vital Signs: Temp: 97.6 F (36.4 C) (10/08 2234) Temp Source: Oral (10/08 2234) BP: 121/74 (10/08 2234) Pulse Rate: 110 (10/08 2234)  Labs: Recent Labs    12/24/17 1159 12/24/17 1756 12/24/17 2243  HGB 13.6 13.3  --   HCT 41.1 41.3  --   PLT 126.0* 131*  --   LABPROT  --   --  19.2*  INR  --   --  1.63  CREATININE 1.67* 1.66*  --   TROPONINI  --   --  <0.03    Estimated Creatinine Clearance: 36 mL/min (A) (by C-G formula based on SCr of 1.66 mg/dL (H)).   Medical History: Past Medical History:  Diagnosis Date  . Adenomatous colon polyp   . Aortic stenosis    a. mild by echo 2017.  Marland Kitchen CAD (coronary artery disease)    a. S/P CABG x 1 @ time of MV annuloplasty;  b. 02/2010 ETT: neg for ischemia.  . Carotid artery stenosis    1-39% by dopplers 10/2016  . Chronic diastolic CHF (congestive heart failure) ([place])   . CKD (chronic kidney disease), stage III ([place])   . Current use of long term anticoagulation    a. Coumadin in setting of afib.  . Diverticulosis    Colonoscopy  . Dyslipidemia   . Essential hypertension   . H/O mitral valve repair   . Hemorrhoids   . Hyperlipidemia   . Hypertension   . Permanent atrial fibrillation    a. CHA2DS2VASc = 6 -->chronic coumadin;  b. Prev on amio-->d/c 2/2 hypothyroidism. >> resumed 5/16. c. Notes from 2017 indicate interstitial lung disease by pulm appt, question amio toxicity, also increased liver attenuation. Rate control pursued and amiodarone discontinued.  Marland Kitchen TIA (transient ischemic attack)     Assessment: 84 y/o  M on warfarin PTA for afib, now presents to the ED with worsening shortness of breath, INR is sub-therapeutic at 1.63, last dose of warfarin on 10/7. Hgb good. Plts 131. Noted renal dysfunction.   Goal of Therapy:  INR 2-3 Monitor platelets by anticoagulation protocol: Yes   Plan:  Warfarin 6 mg PO x 1 now Daily PT/INR, adjust dose as needed Monitor for bleeding  Narda Bonds 12/24/2017,11:45 PM

## 2017-12-24 NOTE — Telephone Encounter (Signed)
Spoke with the patient's wife, they are going to pulmonology, the patient stated she was not going to return to the practice due to not getting the results they wanted. Advised the patient that she would need to follow up with cardiology for future medication refills.

## 2017-12-24 NOTE — Consult Note (Signed)
Cardiology Consultation Note    Patient ID: [name], MRN: [id number], DOB/AGE: 1931/12/16 84 y.o. Admit date: 12/24/2017   Date of Consult: 12/24/2017 Primary Physician: [name], MD Primary Cardiologist: Radford Pax  Reason for Consultation: CHF Requesting MD: Dr. Winfred Leeds  HPI: [name] is a 84 y.o. male with a history of coronary artery disease status post remote CABG, mitral valve repair, diastolic heart failure, ILD, CKD, A. fib on Coumadin, hypertension, who presents with weakness and worsening of chronic shortness of breath.  The patient reports an several days of a dry cough about 3 weeks ago, and since that time he feels that he has been increasingly dyspneic with minimal exertion.  He has had trace lower extremity edema, but no appreciable weight gain related to fluid.  Today, he felt the onset of generalized weakness and nausea.  He was actually seen by his pulmonologist earlier today before the onset of the symptoms this afternoon.  Due to significant fatigue and nausea, he presented to the emergency department for evaluation.  In the ED, vital signs were notable for atrial fibrillation with rates in the low 100s to 110s, and he was oxygenating well on room air.  Labs showed a creatinine of 1.66, which is at his baseline.  Initial troponin was negative and CBC was within normal limits.  Chest x-ray showed findings consistent with known diagnosis of ILD.  He was given 20 mg of IV Lasix and cardiology was consulted for aid in management of volume overload.  Past Medical History:  Diagnosis Date  . Adenomatous colon polyp   . Aortic stenosis    a. mild by echo 2017.  Marland Kitchen CAD (coronary artery disease)    a. S/P CABG x 1 @ time of MV annuloplasty;  b. 02/2010 ETT: neg for ischemia.  . Carotid artery stenosis    1-39% by dopplers 10/2016  . Chronic diastolic CHF (congestive heart failure) ([place])   . CKD (chronic kidney disease), stage III ([place])   . Current use of long  term anticoagulation    a. Coumadin in setting of afib.  . Diverticulosis    Colonoscopy  . Dyslipidemia   . Essential hypertension   . H/O mitral valve repair   . Hemorrhoids   . Hyperlipidemia   . Hypertension   . Permanent atrial fibrillation    a. CHA2DS2VASc = 6 -->chronic coumadin;  b. Prev on amio-->d/c 2/2 hypothyroidism. >> resumed 5/16. c. Notes from 2017 indicate interstitial lung disease by pulm appt, question amio toxicity, also increased liver attenuation. Rate control pursued and amiodarone discontinued.  Marland Kitchen TIA (transient ischemic attack)       Surgical History:  Past Surgical History:  Procedure Laterality Date  . CORONARY ARTERY BYPASS GRAFT    . MITRAL VALVE REPAIR       Home Meds: Prior to Admission medications   Medication Sig Start Date End Date Taking? Authorizing Provider  amoxicillin (AMOXIL) 500 MG capsule Take 2,000 mg by mouth See admin instructions. Take 2,000 mg by mouth one hour prior to dental visits every 6 months 12/17/17  Yes [provider]  Cholecalciferol (VITAMIN D-3) 1000 units CAPS Take 3,000 Units by mouth daily.   Yes [provider]  hydrocortisone (ANUSOL-HC) 2.5 % rectal cream Place 1 application rectally 2 (two) times daily. Patient taking differently: Place 1 application rectally 2 (two) times daily as needed for hemorrhoids.  08/02/15  Yes Zehr, Laban Emperor, PA-C  levothyroxine (SYNTHROID, LEVOTHROID) 50 MCG  tablet Take 50 mcg by mouth at bedtime.    Yes [provider]  naproxen sodium (ALEVE) 220 MG tablet Take 220 mg by mouth 2 (two) times daily as needed (for pain or headaches).   Yes [provider]  pravastatin (PRAVACHOL) 80 MG tablet Take 1 tablet (80 mg total) by mouth every evening. 02/28/17 02/28/18 Yes Turner, Eber Hong, MD  warfarin (COUMADIN) 4 MG tablet Take 4 mg by mouth every evening.   Yes [provider]  nebivolol (BYSTOLIC) 5 MG tablet Take 1 tablet (5 mg total) by mouth at  bedtime. Patient not taking: Reported on 12/24/2017 04/26/17   Sherran Needs, NP    Inpatient Medications:     Allergies:  Allergies  Allergen Reactions  . Asa [Aspirin] Other (See Comments)    Bleeding- has internal hemorrhoids  . Flomax [Tamsulosin] Other (See Comments)    Made his heart go out of rhythm    Social History   Socioeconomic History  . Marital status: Married    Spouse name: Not on file  . Number of children: Not on file  . Years of education: Not on file  . Highest education level: Not on file  Occupational History  . 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 Financial resource strain: Not on file  . Food insecurity:    Worry: Not on file    Inability: Not on file  . Transportation needs:    Medical: Not on file    Non-medical: Not on file  Tobacco Use  . Smoking status: Former Smoker    Packs/day: 0.10    Years: 2.00    Pack years: 0.20    Types: Cigarettes    Last attempt to quit: 03/20/1955    Years since quitting: 62.8  . Smokeless tobacco: Never Used  Substance and Sexual Activity  . Alcohol use: No    Alcohol/week: 0.0 standard drinks  . Drug use: No  . Sexual activity: Not Currently  Lifestyle  . Physical activity:    Days per week: Not on file    Minutes per session: Not on file  . Stress: Not on file  Relationships  . Social connections:    Talks on phone: Not on file    Gets together: Not on file    Attends religious service: Not on file    Active member of club or organization: Not on file    Attends meetings of clubs or organizations: Not on file    Relationship status: Not on file  . Intimate partner violence:    Fear of current or ex partner: Not on file    Emotionally abused: Not on file    Physically abused: Not on file    Forced sexual activity: Not on file  Other Topics Concern  . Not on file  Social History Narrative  . Not on file     Family History  Problem Relation Age of Onset  .  Stroke Mother        Deceased  . Heart disease Father        Deceased  . Heart failure Brother        Deceased  . Stroke Brother      Review of Systems: All other systems reviewed and are otherwise negative except as noted above.  Labs: No results for input(s): CKTOTAL, CKMB, TROPONINI in the last 72 hours. Lab Results  Component Value Date   WBC 5.6  12/24/2017   HGB 13.3 12/24/2017   HCT 41.3 12/24/2017   MCV 92.6 12/24/2017   PLT 131 (L) 12/24/2017    Recent Labs  Lab 12/24/17 1756  NA 137  K 4.2  CL 104  CO2 22  BUN 24*  CREATININE 1.66*  CALCIUM 9.2  GLUCOSE 105*   Lab Results  Component Value Date   CHOL 193 06/17/2017   HDL 35 (L) 06/17/2017   LDLCALC 135 (H) 06/17/2017   TRIG 114 06/17/2017   No results found for: DDIMER  Radiology/Studies:  Dg Chest 2 View  Result Date: 12/24/2017 CLINICAL DATA:  Dyspnea on exertion EXAM: CHEST - 2 VIEW COMPARISON:  02/06/2014 and chest CT 05/16/2015 FINDINGS: Cardiomegaly with aortic atherosclerosis and post CABG change. Chronic interstitial prominence with basilar atelectasis left greater than right. Equivocal trace posterior pleural effusions. No acute nor suspicious osseous abnormalities. IMPRESSION: 1. Stable cardiomegaly with post CABG change and aortic atherosclerosis. 2. Chronic interstitial lung disease. 3. Bibasilar atelectasis. Equivocal trace posterior pleural effusions. Electronically Signed   By: Ashley Royalty M.D.   On: 12/24/2017 18:37    Wt Readings from Last 3 Encounters:  12/24/17 87.5 kg  05/02/17 91.2 kg  04/24/17 90.1 kg    EKG: Atrial fibrillation, rate in 1 teens.  No acute ST or T wave changes.  Physical Exam: Blood pressure 116/69, pulse (!) 34, resp. rate (!) 41, SpO2 96 %. There is no height or weight on file to calculate BMI. General: Well developed, well nourished, in no acute distress. Head: Normocephalic, atraumatic, sclera non-icteric, no xanthomas, nares are without discharge.  Neck:  Negative for carotid bruits. JVP at angle of the mandible Lungs: Occasional inspiratory crackles. Heart: Tachycardic, irregular with normal S1 S2.  Systolic ejection murmur at the base abdomen: Soft, non-tender, non-distended with normoactive bowel sounds. No hepatomegaly. No rebound/guarding. No obvious abdominal masses. Msk:  Strength and tone appear normal for age. Extremities: No clubbing or cyanosis.  Trace lower extremity edema.  Distal pedal pulses are 2+ and equal bilaterally. Neuro: Alert and oriented X 3. No facial asymmetry. No focal deficit. Moves all extremities spontaneously. Psych:  Responds to questions appropriately with a normal affect.     Assessment and Plan  84 year old man with significant medical comorbidities, who presents with fatigue, nausea, and subacute worsening of chronic dyspnea on exertion.  Appears volume overloaded on exam.  1.  Acute on chronic diastolic heart failure: Would start diuresis with an additional 40 mg of IV Lasix.   From exam, does not appear that his aortic stenosis has significantly progressed, however given worsening dyspnea and volume overload would be prudent to recheck echocardiogram in the AM.  2.  Atrial fibrillation: This is a chronic issue for him.  Looking back at ECGs, his heart rate typically has been running above 100.  It is possible that there could be some component of tachycardia mediated cardiomyopathy as well.  Would continue home Bystolic for now, can consider uptitrating depending on heart rate trends.  Continue Coumadin for anticoagulation.  Will follow along with you.  Signed, Doylene Canning, MD 12/24/2017, 9:42 PM

## 2017-12-24 NOTE — H&P (Signed)
History and Physical    [name] PNT:[id number] DOB: [birth year] DOA: 12/24/2017  PCP: [name], MD  Patient coming from: Home.  Chief Complaint: Shortness of breath.  HPI: [name] is a 84 y.o. male with history of CAD status post CABG mitral valve repair, atrial fibrillation, interstitial lung disease, hypothyroidism presents to the ER with complaints of increasing shortness of breath.  Patient has just visited the pulmonology clinic and at that time plans were to get high-resolution CT to follow-up with his interstitial lung disease.  Patient states over the last 3 weeks he has become more short of breath on minimal exertion denies any chest pain has been having some dry cough.  ED Course: In the ER patient was found to be in A. fib with RVR and was started on Cardizem infusion after bolus.  Chest x-ray shows interstitial lung pattern with pleural effusion.  On-call cardiologist was consulted patient was given Lasix and admitted for further management of acute CHF and A. fib with RVR.  Patient's BNP was 927.  Troponin was negative.  Review of Systems: As per HPI, rest all negative.   Past Medical History:  Diagnosis Date  . Adenomatous colon polyp   . Aortic stenosis    a. mild by echo 2017.  Marland Kitchen CAD (coronary artery disease)    a. S/P CABG x 1 @ time of MV annuloplasty;  b. 02/2010 ETT: neg for ischemia.  . Carotid artery stenosis    1-39% by dopplers 10/2016  . Chronic diastolic CHF (congestive heart failure) ([place])   . CKD (chronic kidney disease), stage III ([place])   . Current use of long term anticoagulation    a. Coumadin in setting of afib.  . Diverticulosis    Colonoscopy  . Dyslipidemia   . Essential hypertension   . H/O mitral valve repair   . Hemorrhoids   . Hyperlipidemia   . Hypertension   . Permanent atrial fibrillation    a. CHA2DS2VASc = 6 -->chronic coumadin;  b. Prev on amio-->d/c 2/2 hypothyroidism. >> resumed 5/16. c. Notes from 2017  indicate interstitial lung disease by pulm appt, question amio toxicity, also increased liver attenuation. Rate control pursued and amiodarone discontinued.  Marland Kitchen TIA (transient ischemic attack)     Past Surgical History:  Procedure Laterality Date  . CORONARY ARTERY BYPASS GRAFT    . MITRAL VALVE REPAIR       reports that he quit smoking about 62 years ago. His smoking use included cigarettes. He has a 0.20 pack-year smoking history. He has never used smokeless tobacco. He reports that he does not drink alcohol or use drugs.  Allergies  Allergen Reactions  . Asa [Aspirin] Other (See Comments)    Bleeding- has internal hemorrhoids  . Flomax [Tamsulosin] Other (See Comments)    Made his heart go out of rhythm    Family History  Problem Relation Age of Onset  . Stroke Mother        Deceased  . Heart disease Father        Deceased  . Heart failure Brother        Deceased  . Stroke Brother     Prior to Admission medications   Medication Sig Start Date End Date Taking? Authorizing Provider  amoxicillin (AMOXIL) 500 MG capsule Take 2,000 mg by mouth See admin instructions. Take 2,000 mg by mouth one hour prior to dental visits every 6 months 12/17/17  Yes [provider]  Cholecalciferol (VITAMIN  D-3) 1000 units CAPS Take 3,000 Units by mouth daily.   Yes [provider]  hydrocortisone (ANUSOL-HC) 2.5 % rectal cream Place 1 application rectally 2 (two) times daily. Patient taking differently: Place 1 application rectally 2 (two) times daily as needed for hemorrhoids.  08/02/15  Yes Zehr, Laban Emperor, PA-C  levothyroxine (SYNTHROID, LEVOTHROID) 50 MCG tablet Take 50 mcg by mouth at bedtime.    Yes [provider]  naproxen sodium (ALEVE) 220 MG tablet Take 220 mg by mouth 2 (two) times daily as needed (for pain or headaches).   Yes [provider]  pravastatin (PRAVACHOL) 80 MG tablet Take 1 tablet (80 mg total) by mouth every evening. 02/28/17 02/28/18  Yes Turner, Eber Hong, MD  warfarin (COUMADIN) 4 MG tablet Take 4 mg by mouth every evening.   Yes [provider]  nebivolol (BYSTOLIC) 5 MG tablet Take 1 tablet (5 mg total) by mouth at bedtime. Patient not taking: Reported on 12/24/2017 04/26/17   Sherran Needs, NP    Physical Exam: Vitals:   12/24/17 2100 12/24/17 2137 12/24/17 2145 12/24/17 2200  BP: (!) 115/99 116/69 122/74 (!) 136/92  Pulse:  (!) 34 84 (!) 55  Resp: (!) 26 (!) 41 (!) 27 14  SpO2: 94% 96% 96% 93%      Constitutional: Moderately built and nourished. Vitals:   12/24/17 2100 12/24/17 2137 12/24/17 2145 12/24/17 2200  BP: (!) 115/99 116/69 122/74 (!) 136/92  Pulse:  (!) 34 84 (!) 55  Resp: (!) 26 (!) 41 (!) 27 14  SpO2: 94% 96% 96% 93%   Eyes: Anicteric no pallor. ENMT: No discharge from the ears eyes nose or mouth. Neck: No JVD appreciated no mass felt. Respiratory: No rhonchi or crepitations. Cardiovascular: S1-S2 heard no murmurs appreciated. Abdomen: Soft nontender bowel sounds present. Musculoskeletal: Mild edema of the both lower extremities. Skin: No rash. Neurologic: Alert awake oriented to time place and person.  Moves all extremities. Psychiatric: Appears normal.  Normal affect.   Labs on Admission: I have personally reviewed following labs and imaging studies  CBC: Recent Labs  Lab 12/24/17 1159 12/24/17 1756  WBC 6.4 5.6  HGB 13.6 13.3  HCT 41.1 41.3  MCV 90.6 92.6  PLT 126.0* 161*   Basic Metabolic Panel: Recent Labs  Lab 12/24/17 1159 12/24/17 1756  NA 137 137  K 5.0 4.2  CL 104 104  CO2 26 22  GLUCOSE 109* 105*  BUN 27* 24*  CREATININE 1.67* 1.66*  CALCIUM 9.6 9.2   GFR: Estimated Creatinine Clearance: 36.2 mL/min (A) (by C-G formula based on SCr of 1.66 mg/dL (H)). Liver Function Tests: No results for input(s): AST, ALT, ALKPHOS, BILITOT, PROT, ALBUMIN in the last 168 hours. No results for input(s): LIPASE, AMYLASE in the last 168 hours. No results for  input(s): AMMONIA in the last 168 hours. Coagulation Profile: No results for input(s): INR, PROTIME in the last 168 hours. Cardiac Enzymes: No results for input(s): CKTOTAL, CKMB, CKMBINDEX, TROPONINI in the last 168 hours. BNP (last 3 results) No results for input(s): PROBNP in the last 8760 hours. HbA1C: No results for input(s): HGBA1C in the last 72 hours. CBG: No results for input(s): GLUCAP in the last 168 hours. Lipid Profile: No results for input(s): CHOL, HDL, LDLCALC, TRIG, CHOLHDL, LDLDIRECT in the last 72 hours. Thyroid Function Tests: Recent Labs    12/24/17 1159  TSH 2.95   Anemia Panel: No results for input(s): VITAMINB12, FOLATE, FERRITIN, TIBC, IRON, RETICCTPCT  in the last 72 hours. Urine analysis:    Component Value Date/Time   COLORURINE YELLOW 12/24/2017 1939   APPEARANCEUR CLEAR 12/24/2017 1939   LABSPEC 1.016 12/24/2017 1939   PHURINE 5.0 12/24/2017 1939   GLUCOSEU NEGATIVE 12/24/2017 1939   HGBUR NEGATIVE 12/24/2017 1939   [place] NEGATIVE 12/24/2017 1939   KETONESUR NEGATIVE 12/24/2017 1939   PROTEINUR NEGATIVE 12/24/2017 1939   NITRITE NEGATIVE 12/24/2017 1939   LEUKOCYTESUR NEGATIVE 12/24/2017 1939   Sepsis Labs: @LABRCNTIP (procalcitonin:4,lacticidven:4) )No results found for this or any previous visit (from the past 240 hour(s)).   Radiological Exams on Admission: Dg Chest 2 View  Result Date: 12/24/2017 CLINICAL DATA:  Dyspnea on exertion EXAM: CHEST - 2 VIEW COMPARISON:  02/06/2014 and chest CT 05/16/2015 FINDINGS: Cardiomegaly with aortic atherosclerosis and post CABG change. Chronic interstitial prominence with basilar atelectasis left greater than right. Equivocal trace posterior pleural effusions. No acute nor suspicious osseous abnormalities. IMPRESSION: 1. Stable cardiomegaly with post CABG change and aortic atherosclerosis. 2. Chronic interstitial lung disease. 3. Bibasilar atelectasis. Equivocal trace posterior pleural effusions.  Electronically Signed   By: Ashley Royalty M.D.   On: 12/24/2017 18:37    EKG: Independently reviewed.  A. fib with RVR.  Assessment/Plan Principal Problem:   Acute on chronic diastolic CHF (congestive heart failure) ([facility]) Active Problems:   H/O mitral valve repair   Permanent atrial fibrillation   Essential hypertension   CAD (coronary artery disease), native coronary artery   ILD (interstitial lung disease) ([facility])   CHF (congestive heart failure) ([place])    1. Acute on chronic diastolic CHF with previous history of mitral valve repair -appreciate cardiology consult.  Last EF measured was 55 to 60% with grade 3 diastolic dysfunction and May 2017.  Patient was placed on Lasix closely follow intake output metabolic panel and daily weights.  Check 2D echo.  Cycle cardiac markers. 2. A. fib with RVR likely contributing to #1.  Patient is on Cardizem infusion and also started on Bystolic.  Patient does not recall taking Bystolic at home.  On Coumadin for anticoagulation.  Chads 2 vasc score is more than 2. 3. Interstitial lung disease been followed up by pulmonologist.  Likely contributing to patient's symptoms. 4. Hypertension on Bystolic. 5. History of chronic kidney disease stage III follow metabolic panel. 6. History of CAD status post CABG on statins and Coumadin.  Denies any chest pain. 7. Hypothyroidism on Synthroid.  Check TSH. 8. Hyperlipidemia on statins.   DVT prophylaxis: Coumadin. Code Status: Full code. Family Communication: No family at the bedside. Disposition Plan: Home. Consults called: Cardiology. Admission status: Observation.   Rise Patience MD [facility] Pager [phone].  If 7PM-7AM, please contact night-coverage [email or web address] Password [email or web address]  12/24/2017, 10:21 PM

## 2017-12-24 NOTE — Assessment & Plan Note (Signed)
Walk today in office on room air  We will order high-res CT to follow your NSIP and your interstitial lung disease  We will order spirometry with DLCO  Follow-up in 4 to 6 weeks with Dr. Chase Caller  Lab work >>>TSH, bmet, CBC, proBNP  Okay to get flu vaccine at [facility]

## 2017-12-24 NOTE — Telephone Encounter (Signed)
Referral notes received from [facility] at [facility], Dr. [name] office. Phone: [phone] Fax: [phone]  Notes sent to scheduling

## 2017-12-24 NOTE — ED Notes (Addendum)
This RN called lab to check on BNP status, per lab it is in process and should be done in the next 15 minutes. MD Jacubowitz updated.

## 2017-12-25 ENCOUNTER — Observation Stay (HOSPITAL_BASED_OUTPATIENT_CLINIC_OR_DEPARTMENT_OTHER): Payer: PPO

## 2017-12-25 DIAGNOSIS — Z79890 Hormone replacement therapy (postmenopausal): Secondary | ICD-10-CM | POA: Diagnosis not present

## 2017-12-25 DIAGNOSIS — I4821 Permanent atrial fibrillation: Secondary | ICD-10-CM

## 2017-12-25 DIAGNOSIS — Z7901 Long term (current) use of anticoagulants: Secondary | ICD-10-CM | POA: Diagnosis not present

## 2017-12-25 DIAGNOSIS — I5043 Acute on chronic combined systolic (congestive) and diastolic (congestive) heart failure: Secondary | ICD-10-CM | POA: Diagnosis present

## 2017-12-25 DIAGNOSIS — I350 Nonrheumatic aortic (valve) stenosis: Secondary | ICD-10-CM | POA: Diagnosis present

## 2017-12-25 DIAGNOSIS — I340 Nonrheumatic mitral (valve) insufficiency: Secondary | ICD-10-CM | POA: Diagnosis not present

## 2017-12-25 DIAGNOSIS — I4891 Unspecified atrial fibrillation: Secondary | ICD-10-CM | POA: Diagnosis not present

## 2017-12-25 DIAGNOSIS — Z6827 Body mass index (BMI) 27.0-27.9, adult: Secondary | ICD-10-CM | POA: Diagnosis not present

## 2017-12-25 DIAGNOSIS — E785 Hyperlipidemia, unspecified: Secondary | ICD-10-CM | POA: Diagnosis present

## 2017-12-25 DIAGNOSIS — Z8673 Personal history of transient ischemic attack (TIA), and cerebral infarction without residual deficits: Secondary | ICD-10-CM | POA: Diagnosis not present

## 2017-12-25 DIAGNOSIS — Z886 Allergy status to analgesic agent status: Secondary | ICD-10-CM | POA: Diagnosis not present

## 2017-12-25 DIAGNOSIS — E669 Obesity, unspecified: Secondary | ICD-10-CM | POA: Diagnosis present

## 2017-12-25 DIAGNOSIS — Z951 Presence of aortocoronary bypass graft: Secondary | ICD-10-CM | POA: Diagnosis not present

## 2017-12-25 DIAGNOSIS — Z87891 Personal history of nicotine dependence: Secondary | ICD-10-CM | POA: Diagnosis not present

## 2017-12-25 DIAGNOSIS — I509 Heart failure, unspecified: Secondary | ICD-10-CM | POA: Diagnosis not present

## 2017-12-25 DIAGNOSIS — Z86010 Personal history of colonic polyps: Secondary | ICD-10-CM | POA: Diagnosis not present

## 2017-12-25 DIAGNOSIS — E039 Hypothyroidism, unspecified: Secondary | ICD-10-CM | POA: Diagnosis present

## 2017-12-25 DIAGNOSIS — I5033 Acute on chronic diastolic (congestive) heart failure: Secondary | ICD-10-CM | POA: Diagnosis not present

## 2017-12-25 DIAGNOSIS — I5021 Acute systolic (congestive) heart failure: Secondary | ICD-10-CM | POA: Diagnosis not present

## 2017-12-25 DIAGNOSIS — N183 Chronic kidney disease, stage 3 (moderate): Secondary | ICD-10-CM | POA: Diagnosis present

## 2017-12-25 DIAGNOSIS — Z823 Family history of stroke: Secondary | ICD-10-CM | POA: Diagnosis not present

## 2017-12-25 DIAGNOSIS — I2510 Atherosclerotic heart disease of native coronary artery without angina pectoris: Secondary | ICD-10-CM | POA: Diagnosis present

## 2017-12-25 DIAGNOSIS — Z888 Allergy status to other drugs, medicaments and biological substances status: Secondary | ICD-10-CM | POA: Diagnosis not present

## 2017-12-25 DIAGNOSIS — Z9889 Other specified postprocedural states: Secondary | ICD-10-CM

## 2017-12-25 DIAGNOSIS — Z79899 Other long term (current) drug therapy: Secondary | ICD-10-CM | POA: Diagnosis not present

## 2017-12-25 DIAGNOSIS — D696 Thrombocytopenia, unspecified: Secondary | ICD-10-CM | POA: Diagnosis present

## 2017-12-25 DIAGNOSIS — I130 Hypertensive heart and chronic kidney disease with heart failure and stage 1 through stage 4 chronic kidney disease, or unspecified chronic kidney disease: Secondary | ICD-10-CM | POA: Diagnosis present

## 2017-12-25 DIAGNOSIS — J849 Interstitial pulmonary disease, unspecified: Secondary | ICD-10-CM | POA: Diagnosis present

## 2017-12-25 DIAGNOSIS — Z8249 Family history of ischemic heart disease and other diseases of the circulatory system: Secondary | ICD-10-CM | POA: Diagnosis not present

## 2017-12-25 DIAGNOSIS — I10 Essential (primary) hypertension: Secondary | ICD-10-CM

## 2017-12-25 LAB — CBC
HCT: 38.1 % — ABNORMAL LOW (ref 39.0–52.0)
Hemoglobin: 12.1 g/dL — ABNORMAL LOW (ref 13.0–17.0)
MCH: 28.9 pg (ref 26.0–34.0)
MCHC: 31.8 g/dL (ref 30.0–36.0)
MCV: 90.9 fL (ref 80.0–100.0)
Platelets: 132 10*3/uL — ABNORMAL LOW (ref 150–400)
RBC: 4.19 MIL/uL — ABNORMAL LOW (ref 4.22–5.81)
RDW: 15.1 % (ref 11.5–15.5)
WBC: 6.2 10*3/uL (ref 4.0–10.5)
nRBC: 0 % (ref 0.0–0.2)

## 2017-12-25 LAB — BASIC METABOLIC PANEL
Anion gap: 9 (ref 5–15)
BUN: 23 mg/dL (ref 8–23)
CO2: 22 mmol/L (ref 22–32)
CREATININE: 1.64 mg/dL — AB (ref 0.61–1.24)
Calcium: 8.8 mg/dL — ABNORMAL LOW (ref 8.9–10.3)
Chloride: 105 mmol/L (ref 98–111)
GFR calc Af Amer: 43 mL/min — ABNORMAL LOW (ref >60)
GFR, EST NON AFRICAN AMERICAN: 37 mL/min — AB (ref >60)
GLUCOSE: 93 mg/dL (ref 70–99)
Potassium: 3.7 mmol/L (ref 3.5–5.1)
SODIUM: 136 mmol/L (ref 135–145)

## 2017-12-25 LAB — PROTIME-INR
INR: 1.67
PROTHROMBIN TIME: 19.6 s — AB (ref 11.4–15.2)

## 2017-12-25 LAB — PRO B NATRIURETIC PEPTIDE: NT-Pro BNP: 10355 pg/mL — ABNORMAL HIGH (ref 0–486)

## 2017-12-25 LAB — MRSA PCR SCREENING: MRSA BY PCR: NEGATIVE

## 2017-12-25 LAB — ECHOCARDIOGRAM COMPLETE
Height: 68 in
Weight: 3012.8 oz

## 2017-12-25 MED ORDER — METOPROLOL SUCCINATE ER 25 MG PO TB24
12.5000 mg | ORAL_TABLET | Freq: Every day | ORAL | Status: DC
  Administered 2017-12-25 – 2017-12-27 (×3): 12.5 mg via ORAL
  Filled 2017-12-25 (×3): qty 1

## 2017-12-25 MED ORDER — FUROSEMIDE 10 MG/ML IJ SOLN
40.0000 mg | Freq: Two times a day (BID) | INTRAMUSCULAR | Status: DC
  Administered 2017-12-25 – 2017-12-26 (×3): 40 mg via INTRAVENOUS
  Filled 2017-12-25 (×3): qty 4

## 2017-12-25 MED ORDER — WARFARIN SODIUM 5 MG PO TABS
6.0000 mg | ORAL_TABLET | Freq: Once | ORAL | Status: AC
  Administered 2017-12-25: 6 mg via ORAL
  Filled 2017-12-25: qty 1

## 2017-12-25 NOTE — Progress Notes (Signed)
Progress Note  Patient Name: [name] Date of Encounter: 12/25/2017  Primary Cardiologist: Lauree Chandler, MD   Subjective   Feeling better today. No complaints today.   Inpatient Medications    Scheduled Meds: . furosemide  40 mg Intravenous Q12H  . levothyroxine  50 mcg Oral QHS  . metoprolol succinate  12.5 mg Oral Daily  . nitroGLYCERIN  1 inch Topical Once  . pravastatin  80 mg Oral QPM  . Warfarin - Pharmacist Dosing Inpatient   Does not apply q1800   Continuous Infusions:  PRN Meds: acetaminophen **OR** acetaminophen, ondansetron **OR** ondansetron (ZOFRAN) IV   Vital Signs    Vitals:   12/24/17 2145 12/24/17 2200 12/24/17 2234 12/25/17 0500  BP: 122/74 (!) 136/92 121/74 128/89  Pulse: 84 (!) 55 (!) 110 (!) 122  Resp: (!) 27 14 (!) 22 20  Temp:   97.6 F (36.4 C) 97.8 F (36.6 C)  TempSrc:   Oral Oral  SpO2: 96% 93% 96% 94%  Weight:   86.1 kg 85.4 kg  Height:   5\' 8"  (1.727 m)     Intake/Output Summary (Last 24 hours) at 12/25/2017 1022 Last data filed at 12/25/2017 0640 Gross per 24 hour  Intake 280.47 ml  Output 1635 ml  Net -1354.53 ml   Filed Weights   12/24/17 2234 12/25/17 0500  Weight: 86.1 kg 85.4 kg    Telemetry    afib 90s - Personally Reviewed  ECG    afib w/ RVR 124 bpm - Personally Reviewed  Physical Exam   GEN: No acute distress.   Neck: mild JVD, just above the clavicle  Cardiac: RRR, no murmurs, rubs, or gallops.  Respiratory: Clear to auscultation bilaterally. GI: Soft, nontender, non-distended  MS: trace bilateral LEE; No deformity. Neuro:  Nonfocal  Psych: Normal affect   Labs    Chemistry Recent Labs  Lab 12/24/17 1159 12/24/17 1756 12/25/17 0439  NA 137 137 136  K 5.0 4.2 3.7  CL 104 104 105  CO2 26 22 22   GLUCOSE 109* 105* 93  BUN 27* 24* 23  CREATININE 1.67* 1.66* 1.64*  CALCIUM 9.6 9.2 8.8*  GFRNONAA  --  37* 37*  GFRAA  --  42* 43*  ANIONGAP  --  11 9     Hematology Recent Labs    Lab 12/24/17 1159 12/24/17 1756 12/25/17 0439  WBC 6.4 5.6 6.2  RBC 4.54 4.46 4.19*  HGB 13.6 13.3 12.1*  HCT 41.1 41.3 38.1*  MCV 90.6 92.6 90.9  MCH  --  29.8 28.9  MCHC 33.1 32.2 31.8  RDW 15.5 15.2 15.1  PLT 126.0* 131* 132*    Cardiac Enzymes Recent Labs  Lab 12/24/17 2243  TROPONINI <0.03    Recent Labs  Lab 12/24/17 1812  TROPIPOC 0.00     BNP Recent Labs  Lab 12/24/17 1159 12/24/17 1756  BNP  --  927.0*  PROBNP 10,355*  --      DDimer No results for input(s): DDIMER in the last 168 hours.   Radiology    Dg Chest 2 View  Result Date: 12/24/2017 CLINICAL DATA:  Dyspnea on exertion EXAM: CHEST - 2 VIEW COMPARISON:  02/06/2014 and chest CT 05/16/2015 FINDINGS: Cardiomegaly with aortic atherosclerosis and post CABG change. Chronic interstitial prominence with basilar atelectasis left greater than right. Equivocal trace posterior pleural effusions. No acute nor suspicious osseous abnormalities. IMPRESSION: 1. Stable cardiomegaly with post CABG change and aortic atherosclerosis. 2. Chronic interstitial lung disease.  3. Bibasilar atelectasis. Equivocal trace posterior pleural effusions. Electronically Signed   By: Ashley Royalty M.D.   On: 12/24/2017 18:37    Cardiac Studies   2D Echo 07/21/2015  Study Conclusions  - Left ventricle: The cavity size was normal. There was mild focal   basal hypertrophy of the septum. Systolic function was normal.   The estimated ejection fraction was in the range of 55% to 60%.   Wall motion was normal; there were no regional wall motion   abnormalities. Doppler parameters are consistent with a   reversible restrictive pattern, indicative of decreased left   ventricular diastolic compliance and/or increased left atrial   pressure (grade 3 diastolic dysfunction). - Aortic valve: There was very mild stenosis. Peak velocity (S):   215 cm/s. Mean gradient (S): 13 mm Hg. - Mitral valve: Prior mitral valve repair intact. There was  mild   regurgitation. Mean gradient (D): 5 mm Hg. - Left atrium: The atrium was moderately dilated. - Right ventricle: The cavity size was mildly dilated. Wall   thickness was normal.  Impressions:  - Compared to the prior study, there has been no significant   interval change.  Repeat Echo ordered and pending   Patient Profile     [name] is a 84 y.o. male with a history of coronary artery disease status post remote CABG, mitral valve repair, diastolic heart failure, ILD, CKD, A. fib on Coumadin, hypertension, who presents with weakness and worsening of chronic shortness of breath. Admitted for acute on chronic diastolic HF and atrial fibrillation w/ RVR.   Assessment & Plan   1. Acute on chronic diastolic CHF: admit BNP 962. Started on IV Lasix, 40 mg. -1.3L out overnight. Renal function stable with no change in SCr. Electrolytes are WNL. Still with slight JVD just above the clavicle and trace bilateral LEE. Lung fields are clear. He will get another dose of IV Lasix this evening and will transition to PO tomorrow.   2. Atrial Fibrillation w/ RVR: rates in the 120s on admit. Electrolytes are WNL. TSH also normal. Per clinic notes, his afib is persistent. Previously on amiodarone but discontinued due to side effects. On rate control therapy although high doses of AV nodal blocking agents has been limited due to soft BP. He is followed in the Afib clinic. [facility] It was discovered that pt was not taking his Bystolic prior to admit, which likely contributed to his rapid ventricular response. Pt reports low BP at home with BB use. His HR has improved with IV Cardizem. Will plan to transition to low dose metoprolol succinate and will monitor BP and HR.   2. CAD: remote h/o CABG. Denies CP.   4. Mitral Regurgitation: h/o MV repair. Mild MR noted on last echo in 2017. Repeat echo pending.   5. Aortic Stenosis: mild AS noted on prior echo in 2017. Repeat echo  pending.   6. Chronic Anticoagulation: on coumadin for atrial fibrillation. INR is subtherapeutic at 1.67. Pharmacy assisting with dosing. Followed by [facility] as outpatient.   7. CKD: Scr 1.64 today, c/w baseline. Will need close monitoring while diuresing.   8. ILD: followed by outpatient pulmonology.   9. HTN: controlled on current regimen.   10. Thrombocytopenia: chronic.   For questions or updates, please contact [place] Please consult [email or web address] for contact info under        Signed, Lyda Jester, PA-C  12/25/2017, 10:22 AM

## 2017-12-25 NOTE — Progress Notes (Signed)
Please contact the patient and let them know that his proBNP which is a lab test to let us know they may be holding onto fluid was very elevated.  Will start patient on 40 mg of Lasix daily for the next 3 days.  Then take 20 mg of Lasix daily.  Please follow-up with the [facility] and let them know that we have had to start you on these medications.  I would like for them to chronically manage you as that is who you see for primary care.  Please place order for 20 mg of Lasix daily.  Instruct patient to take 2 tablets (40 mg total) for the next 3 days then start 1 tablet (20 mg total) daily until next office visit. >>> Take in the morning  I would like for you to get blood work completed in the next 1 to 2 weeks to check kidney functioning.  We can do that here without an appointment.   Wyn Quaker FNP

## 2017-12-25 NOTE — Progress Notes (Signed)
  Echocardiogram 2D Echocardiogram has been performed.  [name] 12/25/2017, 12:31 PM

## 2017-12-25 NOTE — Telephone Encounter (Signed)
Patient was admitted to [facility], he is being seen my Dr. Harrell Gave, they have follow up at [facility] with Dr. Harrell Gave.

## 2017-12-25 NOTE — Progress Notes (Signed)
PROGRESS NOTE  [name] JME:[id number] DOB: 10-13-1931 DOA: 12/24/2017 PCP: [name], MD   LOS: 0 days   Brief Narrative / Interim history: [name] is a 84 y.o. male with history of CAD status post CABG, mitral valve repair, atrial fibrillation, interstitial lung disease, hypothyroidism and diastolic CHF not on diuretics at home presents to the ER with shortness of breath due to CHF exacerbation and A. fib with RVR.  Started on IV Lasix and Cardizem drip with significant improvement in his breathing the next day.   Subjective: States feeling "200% better".  No chest pain, dyspnea or palpitation.  Assessment & Plan: Principal Problem:   Acute on chronic diastolic CHF (congestive heart failure) (HCC) Active Problems:   H/O mitral valve repair   Permanent atrial fibrillation   Essential hypertension   CAD (coronary artery disease), native coronary artery   ILD (interstitial lung disease) (HCC)   CHF (congestive heart failure) (HCC)  Acute on chronic dCHF with previous history of mitral valve repair:  EF 55 to 60% with G3DD in May 2017.  Not on diuretics at home.  Significant response with IV Lasix this morning.  About 1.6 L urine output overnight.  No signs of fluid overload this morning. -Cardiology following.  We may need to back off on his Lasix -Follow echocardiogram -Continue cycling troponin.  Negative so far. -Daily weight, input and output -INR subtherapeutic.  A. fib with RVR: RVR resolved this morning.  Likely due to the above.  On nebivolol and warfarin at home. -Cardiology following.  On metoprolol and warfarin.  INR subtherapeutic. -Off Cardizem drip this morning.  ILD: Followed by pulmonologist outpatient.  CKD 3: Stable. -Daily BMP  CAD status post CABG on statins and Coumadin.  No anginal symptoms -Continue beta-blocker and statin.  Only on warfarin. -Cardiology following.  Other chronic conditions including hypertension, hypothyroidism  and hyperlipidemia stable -Check TSH for hypothyroidism.  Scheduled Meds: . furosemide  40 mg Intravenous Q12H  . levothyroxine  50 mcg Oral QHS  . metoprolol succinate  12.5 mg Oral Daily  . nitroGLYCERIN  1 inch Topical Once  . pravastatin  80 mg Oral QPM  . warfarin  6 mg Oral ONCE-1800  . Warfarin - Pharmacist Dosing Inpatient   Does not apply q1800   Continuous Infusions: PRN Meds:.acetaminophen **OR** acetaminophen, ondansetron **OR** ondansetron (ZOFRAN) IV  DVT prophylaxis: On warfarin Code Status: Full code Family Communication: Wife and daughter at bedside.  Discussed plan Disposition Plan: Transfer to telemetry bed  Consultants:   Cardiology  Procedures:   Getting echocardiogram  Antimicrobials:  None  Objective: Vitals:   12/24/17 2145 12/24/17 2200 12/24/17 2234 12/25/17 0500  BP: 122/74 (!) 136/92 121/74 128/89  Pulse: 84 (!) 55 (!) 110 (!) 122  Resp: (!) 27 14 (!) 22 20  Temp:   97.6 F (36.4 C) 97.8 F (36.6 C)  TempSrc:   Oral Oral  SpO2: 96% 93% 96% 94%  Weight:   86.1 kg 85.4 kg  Height:   5\' 8"  (1.727 m)     Intake/Output Summary (Last 24 hours) at 12/25/2017 1201 Last data filed at 12/25/2017 0640 Gross per 24 hour  Intake 280.47 ml  Output 1635 ml  Net -1354.53 ml   Filed Weights   12/24/17 2234 12/25/17 0500  Weight: 86.1 kg 85.4 kg    Examination:  GENERAL: appears well, no ditress HEENT: PERRLA, MMM NECK: No JVD LUNGS:  No IWOB, good air movement, CTAB HEART:  RRR with no M/R/G ABD:  Morbidly obese, soft, NT with active BS EXT:   Trace edema bilaterally NEURO:  awake, alert and oriented . No gross deficit PSYCH: calm. Normal affect   CBC: Recent Labs  Lab 12/24/17 1159 12/24/17 1756 12/25/17 0439  WBC 6.4 5.6 6.2  HGB 13.6 13.3 12.1*  HCT 41.1 41.3 38.1*  MCV 90.6 92.6 90.9  PLT 126.0* 131* 353*   Basic Metabolic Panel: Recent Labs  Lab 12/24/17 1159 12/24/17 1756 12/24/17 2243 12/25/17 0439  NA 137 137   --  136  K 5.0 4.2  --  3.7  CL 104 104  --  105  CO2 26 22  --  22  GLUCOSE 109* 105*  --  93  BUN 27* 24*  --  23  CREATININE 1.67* 1.66*  --  1.64*  CALCIUM 9.6 9.2  --  8.8*  MG  --   --  2.0  --    GFR: Estimated Creatinine Clearance: 36.3 mL/min (A) (by C-G formula based on SCr of 1.64 mg/dL (H)). Liver Function Tests: No results for input(s): AST, ALT, ALKPHOS, BILITOT, PROT, ALBUMIN in the last 168 hours. No results for input(s): LIPASE, AMYLASE in the last 168 hours. No results for input(s): AMMONIA in the last 168 hours. Coagulation Profile: Recent Labs  Lab 12/24/17 2243 12/25/17 0439  INR 1.63 1.67   Cardiac Enzymes: Recent Labs  Lab 12/24/17 2243  TROPONINI <0.03   BNP (last 3 results) Recent Labs    12/24/17 1159  PROBNP 10,355*   HbA1C: No results for input(s): HGBA1C in the last 72 hours. CBG: No results for input(s): GLUCAP in the last 168 hours. Lipid Profile: No results for input(s): CHOL, HDL, LDLCALC, TRIG, CHOLHDL, LDLDIRECT in the last 72 hours. Thyroid Function Tests: Recent Labs    12/24/17 2243  TSH 3.558   Anemia Panel: No results for input(s): VITAMINB12, FOLATE, FERRITIN, TIBC, IRON, RETICCTPCT in the last 72 hours. Urine analysis:    Component Value Date/Time   COLORURINE YELLOW 12/24/2017 1939   APPEARANCEUR CLEAR 12/24/2017 1939   LABSPEC 1.016 12/24/2017 1939   PHURINE 5.0 12/24/2017 1939   GLUCOSEU NEGATIVE 12/24/2017 1939   [place] NEGATIVE 12/24/2017 1939   [place] NEGATIVE 12/24/2017 1939   [place] NEGATIVE 12/24/2017 1939   PROTEINUR NEGATIVE 12/24/2017 1939   NITRITE NEGATIVE 12/24/2017 1939   LEUKOCYTESUR NEGATIVE 12/24/2017 1939   Sepsis Labs: Invalid input(s): PROCALCITONIN, LACTICIDVEN  Recent Results (from the past 240 hour(s))  MRSA PCR Screening     Status: None   Collection Time: 12/24/17 10:42 PM  Result Value Ref Range Status   MRSA by PCR NEGATIVE NEGATIVE Final    Comment:        The  GeneXpert MRSA Assay (FDA approved for NASAL specimens only), is one component of a comprehensive MRSA colonization surveillance program. It is not intended to diagnose MRSA infection nor to guide or monitor treatment for MRSA infections. Performed at [facility], [place] [address], [place], [place] [address]       Radiology Studies: Dg Chest 2 View  Result Date: 12/24/2017 CLINICAL DATA:  Dyspnea on exertion EXAM: CHEST - 2 VIEW COMPARISON:  02/06/2014 and chest CT 05/16/2015 FINDINGS: Cardiomegaly with aortic atherosclerosis and post CABG change. Chronic interstitial prominence with basilar atelectasis left greater than right. Equivocal trace posterior pleural effusions. No acute nor suspicious osseous abnormalities. IMPRESSION: 1. Stable cardiomegaly with post CABG change and aortic atherosclerosis. 2. Chronic interstitial  lung disease. 3. Bibasilar atelectasis. Equivocal trace posterior pleural effusions. Electronically Signed   By: Ashley Royalty M.D.   On: 12/24/2017 18:37    Taye T. [facility] Pager [phone]  If 7PM-7AM, please contact night-coverage [email or web address] Password [email or web address] 12/25/2017, 12:01 PM

## 2017-12-25 NOTE — Progress Notes (Signed)
Pt noted to have multiple 2+ second pauses on telemetry with HR in 40s and 50s non-sustained. Cardizem gtt held per [place], MD.

## 2017-12-25 NOTE — Procedures (Deleted)
Echo attempted. Patient receiving respiratory therapy treatment in recliner. Will attempt again later.

## 2017-12-25 NOTE — Progress Notes (Signed)
[place] for Warfarin  Indication: atrial fibrillation  Allergies  Allergen Reactions  . Asa [Aspirin] Other (See Comments)    Bleeding- has internal hemorrhoids  . Flomax [Tamsulosin] Other (See Comments)    Made his heart go out of rhythm    Patient Measurements: Height: 5\' 8"  (172.7 cm) Weight: 188 lb 4.8 oz (85.4 kg) IBW/kg (Calculated) : 68.4  Vital Signs: Temp: 97.8 F (36.6 C) (10/09 0500) Temp Source: Oral (10/09 0500) BP: 128/89 (10/09 0500) Pulse Rate: 122 (10/09 0500)  Labs: Recent Labs    12/24/17 1159 12/24/17 1756 12/24/17 2243 12/25/17 0439  HGB 13.6 13.3  --  12.1*  HCT 41.1 41.3  --  38.1*  PLT 126.0* 131*  --  132*  LABPROT  --   --  19.2* 19.6*  INR  --   --  1.63 1.67  CREATININE 1.67* 1.66*  --  1.64*  TROPONINI  --   --  <0.03  --     Estimated Creatinine Clearance: 36.3 mL/min (A) (by C-G formula based on SCr of 1.64 mg/dL (H)).   Medical History: Past Medical History:  Diagnosis Date  . Adenomatous colon polyp   . Aortic stenosis    a. mild by echo 2017.  [place] Kitchen CAD (coronary artery disease)    a. S/P CABG x 1 @ time of MV annuloplasty;  b. 02/2010 ETT: neg for ischemia.  . Carotid artery stenosis    1-39% by dopplers 10/2016  . Chronic diastolic CHF (congestive heart failure) ([place])   . CKD (chronic kidney disease), stage III ([place])   . Current use of long term anticoagulation    a. Coumadin in setting of afib.  . Diverticulosis    Colonoscopy  . Dyslipidemia   . Essential hypertension   . H/O mitral valve repair   . Hemorrhoids   . Hyperlipidemia   . Hypertension   . Permanent atrial fibrillation    a. CHA2DS2VASc = 6 -->chronic coumadin;  b. Prev on amio-->d/c 2/2 hypothyroidism. >> resumed 5/16. c. Notes from 2017 indicate interstitial lung disease by pulm appt, question amio toxicity, also increased liver attenuation. Rate control pursued and amiodarone discontinued.  Marland Kitchen TIA (transient  ischemic attack)     Assessment: 84 y/o M on warfarin PTA for afib, now presents to the ED with worsening shortness of breath, INR subtherapeutic on admit at 1.63, now 1.67 stable (INR drawn shortly after boosted dose overnight). Last dose of warfarin on 10/7 PTA. Hg down to 12.1, plt low stable 132. Noted renal dysfunction. No bleed documented.  PTA warfarin dose - 4mg  daily per med rec  Goal of Therapy:  INR 2-3 Monitor platelets by anticoagulation protocol: Yes   Plan:  Warfarin 6 mg PO x 1 again tonight - likely resume home dose tomorrow Monitor daily INR, CBC, s/sx bleeding  Elicia Lamp, PharmD, BCPS Clinical Pharmacist Clinical phone [phone] Please check AMION for all [place] contact numbers 12/25/2017 10:27 AM

## 2017-12-25 NOTE — Progress Notes (Signed)
I am so sorry to hear this.  Please make sure the patient follows up with [place] after hospital discharge.    He can also contact the patient's wife to ensure that they have a pulmonary care consult while they are inpatient.  Wyn Quaker, FNP

## 2017-12-26 DIAGNOSIS — I509 Heart failure, unspecified: Secondary | ICD-10-CM

## 2017-12-26 DIAGNOSIS — I5043 Acute on chronic combined systolic (congestive) and diastolic (congestive) heart failure: Secondary | ICD-10-CM

## 2017-12-26 DIAGNOSIS — I4891 Unspecified atrial fibrillation: Secondary | ICD-10-CM

## 2017-12-26 DIAGNOSIS — I5021 Acute systolic (congestive) heart failure: Secondary | ICD-10-CM

## 2017-12-26 LAB — BASIC METABOLIC PANEL
Anion gap: 11 (ref 5–15)
BUN: 27 mg/dL — ABNORMAL HIGH (ref 8–23)
CALCIUM: 9.7 mg/dL (ref 8.9–10.3)
CO2: 25 mmol/L (ref 22–32)
CREATININE: 1.89 mg/dL — AB (ref 0.61–1.24)
Chloride: 102 mmol/L (ref 98–111)
GFR calc Af Amer: 36 mL/min — ABNORMAL LOW (ref >60)
GFR, EST NON AFRICAN AMERICAN: 31 mL/min — AB (ref >60)
Glucose, Bld: 106 mg/dL — ABNORMAL HIGH (ref 70–99)
Potassium: 3.8 mmol/L (ref 3.5–5.1)
Sodium: 138 mmol/L (ref 135–145)

## 2017-12-26 LAB — PROTIME-INR
INR: 1.78
Prothrombin Time: 20.5 seconds — ABNORMAL HIGH (ref 11.4–15.2)

## 2017-12-26 MED ORDER — WARFARIN SODIUM 4 MG PO TABS
4.0000 mg | ORAL_TABLET | Freq: Once | ORAL | Status: AC
  Administered 2017-12-26: 4 mg via ORAL
  Filled 2017-12-26: qty 1

## 2017-12-26 MED ORDER — FUROSEMIDE 40 MG PO TABS: 40.0000 mg | ORAL_TABLET | Freq: Every day | ORAL | Status: DC

## 2017-12-26 NOTE — Progress Notes (Signed)
[place] for Warfarin  Indication: atrial fibrillation  Allergies  Allergen Reactions  . Asa [Aspirin] Other (See Comments)    Bleeding- has internal hemorrhoids  . Flomax [Tamsulosin] Other (See Comments)    Made his heart go out of rhythm    Patient Measurements: Height: 5\' 8"  (172.7 cm) Weight: 178 lb 9.6 oz (81 kg) IBW/kg (Calculated) : 68.4  Vital Signs: Temp: 97.8 F (36.6 C) (10/10 0619) Temp Source: Oral (10/10 0619) BP: 102/69 (10/10 0838) Pulse Rate: 97 (10/10 0838)  Labs: Recent Labs    12/24/17 1159 12/24/17 1756 12/24/17 2243 12/25/17 0439 12/26/17 0420  HGB 13.6 13.3  --  12.1*  --   HCT 41.1 41.3  --  38.1*  --   PLT 126.0* 131*  --  132*  --   LABPROT  --   --  19.2* 19.6* 20.5*  INR  --   --  1.63 1.67 1.78  CREATININE 1.67* 1.66*  --  1.64* 1.89*  TROPONINI  --   --  <0.03  --   --     Estimated Creatinine Clearance: 28.7 mL/min (A) (by C-G formula based on SCr of 1.89 mg/dL (H)).   Medical History: Past Medical History:  Diagnosis Date  . Adenomatous colon polyp   . Aortic stenosis    a. mild by echo 2017.  [place] Kitchen CAD (coronary artery disease)    a. S/P CABG x 1 @ time of MV annuloplasty;  b. 02/2010 ETT: neg for ischemia.  . Carotid artery stenosis    1-39% by dopplers 10/2016  . Chronic diastolic CHF (congestive heart failure) ([place])   . CKD (chronic kidney disease), stage III ([place])   . Current use of long term anticoagulation    a. Coumadin in setting of afib.  . Diverticulosis    Colonoscopy  . Dyslipidemia   . Essential hypertension   . H/O mitral valve repair   . Hemorrhoids   . Hyperlipidemia   . Hypertension   . Permanent atrial fibrillation    a. CHA2DS2VASc = 6 -->chronic coumadin;  b. Prev on amio-->d/c 2/2 hypothyroidism. >> resumed 5/16. c. Notes from 2017 indicate interstitial lung disease by pulm appt, question amio toxicity, also increased liver attenuation. Rate control pursued and  amiodarone discontinued.  Mar[place] Kitchen TIA (transient ischemic attack)     Assessment: 84 y/o M on warfarin PTA for afib, now presents to the ED with worsening shortness of breath, INR subtherapeutic on admit at 1.63, now up to 1.78 after 2 boosted doses. Last dose of warfarin on 10/7 PTA. Hg down to 12.1, plt low stable 132. Noted renal dysfunction. No bleed documented.  PTA warfarin dose - 4mg  daily per med rec  Goal of Therapy:  INR 2-3 Monitor platelets by anticoagulation protocol: Yes   Plan:  Warfarin 4 mg PO x 1 tonight Monitor daily INR, CBC, s/sx bleeding  Elicia Lamp, PharmD, BCPS Clinical Pharmacist Clinical phone [phone] Please check AMION for all [place] contact numbers 12/26/2017 10:16 AM

## 2017-12-26 NOTE — Progress Notes (Addendum)
[facility]                                                                              Patient Demographics  [name], is a 84 y.o. male, DOB - June 27, 1931, FOY:[id number]  Admit date - 12/24/2017   Admitting Physician Rise Patience, MD  Outpatient Primary MD for the patient is No primary care provider on file.  Outpatient specialists:   LOS - 1  days   Medical records reviewed and are as summarized below:    Chief Complaint  Patient presents with  . Atrial Fibrillation       Brief summary   [name] a 85 y.o.malewithhistory of CAD status post CABG, mitral valve repair, atrial fibrillation, interstitial lung disease, hypothyroidism and diastolic CHF not on diuretics at home presents to the ER with shortness of breath due to CHF exacerbation and A. fib with RVR.  Started on IV Lasix and Cardizem drip with significant improvement in his breathing the next day.    Assessment & Plan    Principal problem Acute on chronic diastolic CHF, now acute systolic heart failure -BNP 927 at the time of admission, -Troponins negative, patient was placed on IV diuresis -2D echo showed EF of 20 to 25% with severe diffuse hypokinesis, new  -Continue strict I's and O's and daily weights -Cardiology transitioned Lasix to oral, creatinine bumped to 1.8 today  Active problems Atrial fibrillation with RVR -No Cardizem given new low EF.  Off Cardizem drip -Continue metoprolol, increase to 25 mg daily -Coumadin currently on hold, cardiology planning cardiac cath -Continue Coumadin per pharmacy  CAD  -Currently no chest pain, remote history of CABG   Mild acute on CKD stage III -Baseline ~1.6.  Creatinine bumped to 1.8 likely due to diuresis  -patient transitioned to oral Lasix   History of mitral valve repair -Continue Coumadin per pharmacy  Hyperlipidemia Continue pravastatin   Hypothyroidism Continue Synthroid 50 MCG daily  Code  Status: Full code DVT Prophylaxis: Coumadin Family Communication: Discussed in detail with the patient, all imaging results, lab results explained to the patient    Disposition Plan: Likely DC home in a.m.  Time Spent in minutes   35 minutes  Procedures:    Consultants:   Cardiology  Antimicrobials:      Medications  Scheduled Meds: . [START ON 12/27/2017] furosemide  40 mg Oral Daily  . levothyroxine  50 mcg Oral QHS  . metoprolol succinate  12.5 mg Oral Daily  . nitroGLYCERIN  1 inch Topical Once  . pravastatin  80 mg Oral QPM  . warfarin  4 mg Oral ONCE-1800  . Warfarin - Pharmacist Dosing Inpatient   Does not apply q1800   Continuous Infusions: PRN Meds:.acetaminophen **OR** acetaminophen, ondansetron **OR** ondansetron (ZOFRAN) IV   Antibiotics   Anti-infectives (From admission, onward)   None        Subjective:   [name] was seen and examined today. Patient denies dizziness, chest pain, shortness of breath, abdominal pain, N/V/D/C, new weakness, numbess, tingling. No acute events overnight.    Objective:   Vitals:   12/25/17 2153 12/26/17 7672  12/26/17 0619 12/26/17 0838  BP: 104/64  96/79 102/69  Pulse: 90  75 97  Resp:      Temp: 97.9 F (36.6 C)  97.8 F (36.6 C)   TempSrc: Oral  Oral   SpO2: 91%  99%   Weight:  81 kg    Height:        Intake/Output Summary (Last 24 hours) at 12/26/2017 1049 Last data filed at 12/26/2017 3557 Gross per 24 hour  Intake 582 ml  Output 3050 ml  Net -2468 ml     Wt Readings from Last 3 Encounters:  12/26/17 81 kg  12/24/17 87.5 kg  05/02/17 91.2 kg     Exam  General: Alert and oriented x 3, NAD  Eyes:   HEENT:  Atraumatic, normocephalic  Cardiovascular: S1 S2 auscultated,iregular  Respiratory: Clear to auscultation bilaterally, no wheezing, rales or rhonchi  Gastrointestinal: Soft, nontender, nondistended, + bowel sounds  Ext: trace pedal edema bilaterally  Neuro: no new  FND  Musculoskeletal: No digital cyanosis, clubbing  Skin: No rashes  Psych: Normal affect and demeanor, alert and oriented x3    Data Reviewed:  I have personally reviewed following labs and imaging studies  Micro Results Recent Results (from the past 240 hour(s))  MRSA PCR Screening     Status: None   Collection Time: 12/24/17 10:42 PM  Result Value Ref Range Status   MRSA by PCR NEGATIVE NEGATIVE Final    Comment:        The GeneXpert MRSA Assay (FDA approved for NASAL specimens only), is one component of a comprehensive MRSA colonization surveillance program. It is not intended to diagnose MRSA infection nor to guide or monitor treatment for MRSA infections. Performed at [facility], [place] [address] [place], [place] [address]     Radiology Reports Dg Chest 2 View  Result Date: 12/24/2017 CLINICAL DATA:  Dyspnea on exertion EXAM: CHEST - 2 VIEW COMPARISON:  02/06/2014 and chest CT 05/16/2015 FINDINGS: Cardiomegaly with aortic atherosclerosis and post CABG change. Chronic interstitial prominence with basilar atelectasis left greater than right. Equivocal trace posterior pleural effusions. No acute nor suspicious osseous abnormalities. IMPRESSION: 1. Stable cardiomegaly with post CABG change and aortic atherosclerosis. 2. Chronic interstitial lung disease. 3. Bibasilar atelectasis. Equivocal trace posterior pleural effusions. Electronically Signed   By: Ashley Royalty M.D.   On: 12/24/2017 18:37    Lab Data:  CBC: Recent Labs  Lab 12/24/17 1159 12/24/17 1756 12/25/17 0439  WBC 6.4 5.6 6.2  HGB 13.6 13.3 12.1*  HCT 41.1 41.3 38.1*  MCV 90.6 92.6 90.9  PLT 126.0* 131* 542*   Basic Metabolic Panel: Recent Labs  Lab 12/24/17 1159 12/24/17 1756 12/24/17 2243 12/25/17 0439 12/26/17 0420  NA 137 137  --  136 138  K 5.0 4.2  --  3.7 3.8  CL 104 104  --  105 102  CO2 26 22  --  22 25  GLUCOSE 109* 105*  --  93 106*  BUN 27* 24*  --  23 27*  CREATININE  1.67* 1.66*  --  1.64* 1.89*  CALCIUM 9.6 9.2  --  8.8* 9.7  MG  --   --  2.0  --   --    GFR: Estimated Creatinine Clearance: 28.7 mL/min (A) (by C-G formula based on SCr of 1.89 mg/dL (H)). Liver Function Tests: No results for input(s): AST, ALT, ALKPHOS, BILITOT, PROT, ALBUMIN in the last 168 hours. No results for input(s): LIPASE, AMYLASE  in the last 168 hours. No results for input(s): AMMONIA in the last 168 hours. Coagulation Profile: Recent Labs  Lab 12/24/17 2243 12/25/17 0439 12/26/17 0420  INR 1.63 1.67 1.78   Cardiac Enzymes: Recent Labs  Lab 12/24/17 2243  TROPONINI <0.03   BNP (last 3 results) Recent Labs    12/24/17 1159  PROBNP 10,355*   HbA1C: No results for input(s): HGBA1C in the last 72 hours. CBG: No results for input(s): GLUCAP in the last 168 hours. Lipid Profile: No results for input(s): CHOL, HDL, LDLCALC, TRIG, CHOLHDL, LDLDIRECT in the last 72 hours. Thyroid Function Tests: Recent Labs    12/24/17 2243  TSH 3.558   Anemia Panel: No results for input(s): VITAMINB12, FOLATE, FERRITIN, TIBC, IRON, RETICCTPCT in the last 72 hours. Urine analysis:    Component Value Date/Time   COLORURINE YELLOW 12/24/2017 1939   APPEARANCEUR CLEAR 12/24/2017 1939   LABSPEC 1.016 12/24/2017 1939   PHURINE 5.0 12/24/2017 1939   GLUCOSEU NEGATIVE 12/24/2017 1939   HGBUR NEGATIVE 12/24/2017 1939   BILIRUBINUR NEGATIVE 12/24/2017 1939   KETONESUR NEGATIVE 12/24/2017 1939   PROTEINUR NEGATIVE 12/24/2017 1939   NITRITE NEGATIVE 12/24/2017 1939   LEUKOCYTESUR NEGATIVE 12/24/2017 1939     Ripudeep Rai M.D. [facility] 12/26/2017, 10:49 AM  Pager: [phone] Between 7am to 7pm - call Pager - [phone]  After 7pm go to [email or web address] - password [email or web address]  Call night coverage person covering after 7pm

## 2017-12-26 NOTE — Progress Notes (Signed)
Progress Note  Patient Name: [name] Date of Encounter: 12/26/2017  Primary Cardiologist: Buford Dresser, MD  Subjective   Feels that breathing is much better. Tolerating low dose metoprolol, blood pressures borderline but denies dizzyness. Still has elevated HR when he ambulates.   Inpatient Medications    Scheduled Meds: . [START ON 12/27/2017] furosemide  40 mg Oral Daily  . levothyroxine  50 mcg Oral QHS  . metoprolol succinate  12.5 mg Oral Daily  . nitroGLYCERIN  1 inch Topical Once  . pravastatin  80 mg Oral QPM  . Warfarin - Pharmacist Dosing Inpatient   Does not apply q1800   Continuous Infusions:  PRN Meds: acetaminophen **OR** acetaminophen, ondansetron **OR** ondansetron (ZOFRAN) IV   Vital Signs    Vitals:   12/25/17 2153 12/26/17 0618 12/26/17 0619 12/26/17 0838  BP: 104/64  96/79 102/69  Pulse: 90  75 97  Resp:      Temp: 97.9 F (36.6 C)  97.8 F (36.6 C)   TempSrc: Oral  Oral   SpO2: 91%  99%   Weight:  81 kg    Height:        Intake/Output Summary (Last 24 hours) at 12/26/2017 0954 Last data filed at 12/26/2017 0618 Gross per 24 hour  Intake 582 ml  Output 3050 ml  Net -2468 ml   Filed Weights   12/24/17 2234 12/25/17 0500 12/26/17 0618  Weight: 86.1 kg 85.4 kg 81 kg    Telemetry    afib 90s-110s - Personally Reviewed  ECG    10/8 afib w/ RVR 124 bpm - Personally Reviewed  Physical Exam   GEN: No acute distress.   Neck: no JVD at 90 degrees Cardiac: irr irr S1/S2, no murmurs, rubs, or gallops.  Respiratory: Clear to auscultation bilaterally. GI: Soft, nontender, non-distended  MS: trace bilateral LEE; No deformity. Neuro:  Nonfocal  Psych: Normal affect   Labs    Chemistry Recent Labs  Lab 12/24/17 1756 12/25/17 0439 12/26/17 0420  NA 137 136 138  K 4.2 3.7 3.8  CL 104 105 102  CO2 22 22 25   GLUCOSE 105* 93 106*  BUN 24* 23 27*  CREATININE 1.66* 1.64* 1.89*  CALCIUM 9.2 8.8* 9.7  GFRNONAA 37*  37* 31*  GFRAA 42* 43* 36*  ANIONGAP 11 9 11      Hematology Recent Labs  Lab 12/24/17 1159 12/24/17 1756 12/25/17 0439  WBC 6.4 5.6 6.2  RBC 4.54 4.46 4.19*  HGB 13.6 13.3 12.1*  HCT 41.1 41.3 38.1*  MCV 90.6 92.6 90.9  MCH  --  29.8 28.9  MCHC 33.1 32.2 31.8  RDW 15.5 15.2 15.1  PLT 126.0* 131* 132*    Cardiac Enzymes Recent Labs  Lab 12/24/17 2243  TROPONINI <0.03    Recent Labs  Lab 12/24/17 1812  TROPIPOC 0.00     BNP Recent Labs  Lab 12/24/17 1159 12/24/17 1756  BNP  --  927.0*  PROBNP 10,355*  --      DDimer No results for input(s): DDIMER in the last 168 hours.   Radiology    Dg Chest 2 View  Result Date: 12/24/2017 CLINICAL DATA:  Dyspnea on exertion EXAM: CHEST - 2 VIEW COMPARISON:  02/06/2014 and chest CT 05/16/2015 FINDINGS: Cardiomegaly with aortic atherosclerosis and post CABG change. Chronic interstitial prominence with basilar atelectasis left greater than right. Equivocal trace posterior pleural effusions. No acute nor suspicious osseous abnormalities. IMPRESSION: 1. Stable cardiomegaly with post CABG change and  aortic atherosclerosis. 2. Chronic interstitial lung disease. 3. Bibasilar atelectasis. Equivocal trace posterior pleural effusions. Electronically Signed   By: Ashley Royalty M.D.   On: 12/24/2017 18:37    Cardiac Studies   Echo 12/25/17 Study Conclusions - Left ventricle: The cavity size was mildly dilated. Systolic   function was severely reduced. The estimated ejection fraction   was in the range of 20% to 25%. Severe diffuse hypokinesis. The   study was not technically sufficient to allow evaluation of LV   diastolic dysfunction due to atrial fibrillation. Doppler   parameters are consistent with high ventricular filling pressure. - Aortic valve: There was mild regurgitation. Valve area (VTI): 0.7   cm^2. Valve area (Vmax): 0.85 cm^2. Valve area (Vmean): 0.76   cm^2. - Mitral valve: S/P prior MV repair Moderate diffuse  thickening and   calcification of the anterior leaflet and posterior leaflet, with   mild involvement of chords. Mobility of the posterior leaflet was   restricted to the point of immobility. There was mild   regurgitation. - Left atrium: The atrium was mildly dilated. - Right ventricle: Systolic function was mildly reduced. - Tricuspid valve: There was trivial regurgitation. - Pulmonary arteries: PA peak pressure: 48 mm Hg (S).  Impressions:  - Mildly dilated LV with severe LV dysfunction. Cannot assess   diastolic function due to underlying atrial fibrillation but   there is increased LV filling pressures. S/P MV repair with mild   MR and moderately thickened MV leaflets. The posterior leaflet   appears fixed. The AV is poorly visualized but there is   calcification of the valve. Cannot assess degree of AS given poor   visualization and gradients may be falsely low in setting of LV   dysfunction. Consider TEE for further evaluation.  Patient Profile     [name] is a 84 y.o. male with a history of coronary artery disease status post remote CABG, mitral valve repair, diastolic heart failure, ILD, CKD, A. fib on Coumadin, hypertension, who presents with weakness and worsening of chronic shortness of breath. Admitted for acute on chronic diastolic HF and atrial fibrillation w/ RVR.   Assessment & Plan   1. Acute on chronic diastolic CHF with new acute systolic heart failure: admit BNP 927. Net negative 3.8L, weights from 86.1 to 81 kg today. Appears euvolemic.  -was receiving 40 mg IV lasix BID. Received a dose this AM. Given slight bump in Cr will change to oral dose tomorrow. -newly reduced EF. I suspect that it is due to the persist tachycardia he has had for an undetermined about of time as an outpatient. However, there is some concern for valvular changes as well, as his AS could not be well evaluated on chest wall echo. No chest pain, negative troponins. Will discuss  evaluation for this with patient. He follows at the [place], so will need to determine if he can have this evaluation with [place] or if it needs to be done through the [place]. -has a history of CABG, no cath in our system -heart failure education with daily weights, fluid restrictions, salt avoidance -his blood pressure cannot tolerate ACEI/ARB/ARNI/MRA at this point.  2. Atrial Fibrillation w/ RVR: rates in the 120s on admit. -AV nodal blocking agents has been limited due to soft BP. -[facility] -pt was not taking his Bystolic prior to admit, which likely contributed to his rapid ventricular response. -no cardizem given new low EF -titrating metoprolol--borderline BP with  12.5 mg metoprolol succinate daily. Will increase to 25 mg metoprolol succinate today, monitor for dizzyness/hypotension closely, especially with his significant diuresis  2. CAD: remote h/o CABG. Denies CP.   4. Mitral Regurgitation: h/o MV repair. Mild MR on echo yesterday.   5. Aortic Stenosis: mild AS noted on prior echo in 2017. Unclear level of stenosis on yesterday's evaluation.  6. Chronic Anticoagulation: on coumadin for atrial fibrillation.  -Pharmacy assisting with dosing. Followed by [facility] as outpatient.   7. CKD: up slightly today. Changing from IV to PO diuretic.  For questions or updates, please contact [place] Please consult [email or web address] for contact info under     Signed, Buford Dresser, MD  12/26/2017, 9:54 AM

## 2017-12-27 LAB — BASIC METABOLIC PANEL
Anion gap: 12 (ref 5–15)
BUN: 36 mg/dL — ABNORMAL HIGH (ref 8–23)
CALCIUM: 9.4 mg/dL (ref 8.9–10.3)
CO2: 26 mmol/L (ref 22–32)
Chloride: 100 mmol/L (ref 98–111)
Creatinine, Ser: 1.96 mg/dL — ABNORMAL HIGH (ref 0.61–1.24)
GFR, EST AFRICAN AMERICAN: 35 mL/min — AB (ref >60)
GFR, EST NON AFRICAN AMERICAN: 30 mL/min — AB (ref >60)
Glucose, Bld: 97 mg/dL (ref 70–99)
Potassium: 3.8 mmol/L (ref 3.5–5.1)
SODIUM: 138 mmol/L (ref 135–145)

## 2017-12-27 LAB — PROTIME-INR
INR: 1.77
PROTHROMBIN TIME: 20.4 s — AB (ref 11.4–15.2)

## 2017-12-27 MED ORDER — WARFARIN SODIUM 5 MG PO TABS: 6.0000 mg | ORAL_TABLET | Freq: Once | ORAL | Status: DC

## 2017-12-27 MED ORDER — FUROSEMIDE 20 MG PO TABS: 20.0000 mg | ORAL_TABLET | Freq: Every day | ORAL | 0 refills | Status: DC | PRN

## 2017-12-27 MED ORDER — METOPROLOL SUCCINATE ER 25 MG PO TB24: 12.5000 mg | ORAL_TABLET | Freq: Every day | ORAL | 3 refills | Status: DC

## 2017-12-27 NOTE — Progress Notes (Signed)
Progress Note  Patient Name: [name] Date of Encounter: 12/27/2017  Primary Cardiologist: Buford Dresser, MD  Subjective   Feeling well overall. No lightheadedness with low dose metoprolol. HR well controlled in bed but rises with minimal activity. Breathing is good.  Inpatient Medications    Scheduled Meds: . levothyroxine  50 mcg Oral QHS  . metoprolol succinate  12.5 mg Oral Daily  . nitroGLYCERIN  1 inch Topical Once  . pravastatin  80 mg Oral QPM  . Warfarin - Pharmacist Dosing Inpatient   Does not apply q1800   Continuous Infusions:  PRN Meds: acetaminophen **OR** acetaminophen, ondansetron **OR** ondansetron (ZOFRAN) IV   Vital Signs    Vitals:   12/27/17 0910 12/27/17 0912 12/27/17 0914 12/27/17 1019  BP: (!) 92/52 106/66 99/73   Pulse: (!) 105 (!) 112 99 95  Resp:      Temp:      TempSrc:      SpO2:      Weight:      Height:        Intake/Output Summary (Last 24 hours) at 12/27/2017 1115 Last data filed at 12/27/2017 0915 Gross per 24 hour  Intake 240 ml  Output 1150 ml  Net -910 ml   Filed Weights   12/25/17 0500 12/26/17 0618 12/27/17 0514  Weight: 85.4 kg 81 kg 81.2 kg    Telemetry    afib 90s-120s - Personally Reviewed  ECG    10/8 afib w/ RVR 124 bpm - Personally Reviewed  Physical Exam   GEN: No acute distress.   Neck: no JVD at 90 degrees Cardiac: irr irr S1/S2, no murmurs, rubs, or gallops.  Respiratory: Clear to auscultation bilaterally. GI: Soft, nontender, non-distended  MS: trace bilateral LEE; No deformity. Neuro:  Nonfocal  Psych: Normal affect   Labs    Chemistry Recent Labs  Lab 12/25/17 0439 12/26/17 0420 12/27/17 0532  NA 136 138 138  K 3.7 3.8 3.8  CL 105 102 100  CO2 22 25 26   GLUCOSE 93 106* 97  BUN 23 27* 36*  CREATININE 1.64* 1.89* 1.96*  CALCIUM 8.8* 9.7 9.4  GFRNONAA 37* 31* 30*  GFRAA 43* 36* 35*  ANIONGAP 9 11 12      Hematology Recent Labs  Lab 12/24/17 1159  12/24/17 1756 12/25/17 0439  WBC 6.4 5.6 6.2  RBC 4.54 4.46 4.19*  HGB 13.6 13.3 12.1*  HCT 41.1 41.3 38.1*  MCV 90.6 92.6 90.9  MCH  --  29.8 28.9  MCHC 33.1 32.2 31.8  RDW 15.5 15.2 15.1  PLT 126.0* 131* 132*    Cardiac Enzymes Recent Labs  Lab 12/24/17 2243  TROPONINI <0.03    Recent Labs  Lab 12/24/17 1812  TROPIPOC 0.00     BNP Recent Labs  Lab 12/24/17 1159 12/24/17 1756  BNP  --  927.0*  PROBNP 10,355*  --      DDimer No results for input(s): DDIMER in the last 168 hours.   Radiology    No results found.  Cardiac Studies   Echo 12/25/17 Study Conclusions - Left ventricle: The cavity size was mildly dilated. Systolic   function was severely reduced. The estimated ejection fraction   was in the range of 20% to 25%. Severe diffuse hypokinesis. The   study was not technically sufficient to allow evaluation of LV   diastolic dysfunction due to atrial fibrillation. Doppler   parameters are consistent with high ventricular filling pressure. - Aortic valve: There was  mild regurgitation. Valve area (VTI): 0.7   cm^2. Valve area (Vmax): 0.85 cm^2. Valve area (Vmean): 0.76   cm^2. - Mitral valve: S/P prior MV repair Moderate diffuse thickening and   calcification of the anterior leaflet and posterior leaflet, with   mild involvement of chords. Mobility of the posterior leaflet was   restricted to the point of immobility. There was mild   regurgitation. - Left atrium: The atrium was mildly dilated. - Right ventricle: Systolic function was mildly reduced. - Tricuspid valve: There was trivial regurgitation. - Pulmonary arteries: PA peak pressure: 48 mm Hg (S).  Impressions:  - Mildly dilated LV with severe LV dysfunction. Cannot assess   diastolic function due to underlying atrial fibrillation but   there is increased LV filling pressures. S/P MV repair with mild   MR and moderately thickened MV leaflets. The posterior leaflet   appears fixed. The AV  is poorly visualized but there is   calcification of the valve. Cannot assess degree of AS given poor   visualization and gradients may be falsely low in setting of LV   dysfunction. Consider TEE for further evaluation.  Patient Profile     [name] is a 84 y.o. male with a history of coronary artery disease status post remote CABG, mitral valve repair, diastolic heart failure, ILD, CKD, A. fib on Coumadin, hypertension, who presents with weakness and worsening of chronic shortness of breath. Admitted for acute on chronic diastolic HF and atrial fibrillation w/ RVR.   Assessment & Plan   1. Acute on chronic diastolic CHF with new acute systolic heart failure:  -Diuresed well. Planned for oral dose today, but with elevated creatinine will hold.  -newly reduced EF. I suspect that it is due to the persist tachycardia he has had for an undetermined about of time as an outpatient. However, there is some concern for valvular changes as well, as his AS could not be well evaluated on chest wall echo. No chest pain, negative troponins. Assess as an outpatient. -has a history of CABG, no cath in our system -heart failure education with daily weights, fluid restrictions, salt avoidance -his blood pressure cannot tolerate ACEI/ARB/ARNI/MRA at this point.  2. Atrial Fibrillation w/ RVR: rates in the 120s on admit. -AV nodal blocking agents has been limited due to soft BP. -[facility] -pt was not taking his Bystolic prior to admit, which likely contributed to his rapid ventricular response. -no cardizem given new low EF -titrating metoprolol--borderline BP with 12.5 mg metoprolol succinate daily.   Inactive issues: CAD: remote h/o CABG. Denies CP.  Mitral Regurgitation: h/o MV repair. Mild MR on echo yesterday.  Aortic Stenosis: mild AS noted on prior echo in 2017. Unclear level of stenosis on yesterday's evaluation. Chronic Anticoagulation: on coumadin for atrial  fibrillation.  -Pharmacy assisting with dosing. [facility]ed [facility] as outpatient.  CKD: up slightly today. Holding diuretic  [facility] will sign off in anticipation of discharge.   Medication Recommendations:  Metoprolol succinate 12.5 mg daily, warfarin Other recommendations (labs, testing, etc):  INR clinic, BMET in 1 week Follow up as an outpatient:  With me at [facility] office  For questions or updates, please contact [place] Please consult [email or web address] for contact info under     Signed, Buford Dresser, MD  12/27/2017, 11:15 AM

## 2017-12-27 NOTE — Progress Notes (Signed)
Patient's BP 86/71, assessing patient, he is asymptomatic, no lightheadedness or dizziness.  He was out of bed when I came in the room.  I will keep monitoring patient.

## 2017-12-27 NOTE — Progress Notes (Signed)
ANTICOAGULATION CONSULT NOTE - Follow Up Consult  Pharmacy Consult for Coumadin Indication: atrial fibrillation  Allergies  Allergen Reactions  . Asa [Aspirin] Other (See Comments)    Bleeding- has internal hemorrhoids  . Flomax [Tamsulosin] Other (See Comments)    Made his heart go out of rhythm    Patient Measurements: Height: 5\' 8"  (172.7 cm) Weight: 179 lb 1.6 oz (81.2 kg) IBW/kg (Calculated) : 68.4  Vital Signs: Temp: 97.8 F (36.6 C) (10/11 0514) Temp Source: Oral (10/11 0514) BP: 99/73 (10/11 0914) Pulse Rate: 95 (10/11 1019)  Labs: Recent Labs    12/24/17 1756  12/24/17 2243 12/25/17 0439 12/26/17 0420 12/27/17 0532  HGB 13.3  --   --  12.1*  --   --   HCT 41.3  --   --  38.1*  --   --   PLT 131*  --   --  132*  --   --   LABPROT  --    < > 19.2* 19.6* 20.5* 20.4*  INR  --    < > 1.63 1.67 1.78 1.77  CREATININE 1.66*  --   --  1.64* 1.89* 1.96*  TROPONINI  --   --  <0.03  --   --   --    < > = values in this interval not displayed.    Estimated Creatinine Clearance: 27.6 mL/min (A) (by C-G formula based on SCr of 1.96 mg/dL (H)).  Assessment:   84 yr old male continues on Coumadin as prior to admission for atrial fibrillation.  INR 1.63 on admit 10/8 and remains subtherapeutic (1.77) after Coumadin 6 mg x 2 days then 4 mg yesterday.  Platelet count low but stable. Last 10/9. No bleeding noted.   Home Coumadin regimen:  4 mg daily  Goal of Therapy:  INR 2-3 Monitor platelets by anticoagulation protocol: Yes   Plan:   Coumadin 6 mg x 1 today.  Daily PT/INR.  CBC in am.  Arty Baumgartner, [place] Pager: [phone] or phone: [phone] 12/27/2017,12:22 PM

## 2017-12-27 NOTE — Discharge Summary (Signed)
Physician Discharge Summary   Patient ID: [name] MRN: [id number] DOB/AGE: 1931/08/13 84 y.o.  Admit date: 12/24/2017 Discharge date: 12/27/2017  Primary Care Physician:  No primary care provider on file.   Recommendations for Outpatient Follow-up:  1. Follow up with PCP in 1-2 weeks 2. Please obtain BMP  in one week  For renal function   Home Health: None Equipment/Devices: None  Discharge Condition: stable  CODE STATUS: FULL   Diet recommendation: Heart healthy diet, low-sodium   Discharge Diagnoses:    . CAD (coronary artery disease), native coronary artery . Essential hypertension . ILD (interstitial lung disease) ([place]) . Permanent atrial fibrillation with RVR . Acute on chronic diastolic CHF (congestive heart failure) ([place])   Consults: Cardiology    Allergies:   Allergies  Allergen Reactions  . Asa [Aspirin] Other (See Comments)    Bleeding- has internal hemorrhoids  . Flomax [Tamsulosin] Other (See Comments)    Made his heart go out of rhythm     DISCHARGE MEDICATIONS: Allergies as of 12/27/2017      Reactions   Asa [aspirin] Other (See Comments)   Bleeding- has internal hemorrhoids   Flomax [tamsulosin] Other (See Comments)   Made his heart go out of rhythm      Medication List    STOP taking these medications   nebivolol 5 MG tablet Commonly known as:  BYSTOLIC     TAKE these medications   amoxicillin 500 MG capsule Commonly known as:  AMOXIL Take 2,000 mg by mouth See admin instructions. Take 2,000 mg by mouth one hour prior to dental visits every 6 months   furosemide 20 MG tablet Commonly known as:  LASIX Take 1 tablet (20 mg total) by mouth daily as needed for fluid or edema (shortness of breath).   hydrocortisone 2.5 % rectal cream Commonly known as:  ANUSOL-HC Place 1 application rectally 2 (two) times daily. What changed:    when to take this  reasons to take this   levothyroxine 50 MCG tablet Commonly known as:   SYNTHROID, LEVOTHROID Take 50 mcg by mouth at bedtime.   metoprolol succinate 25 MG 24 hr tablet Commonly known as:  TOPROL-XL Take 0.5 tablets (12.5 mg total) by mouth daily. Start taking on:  12/28/2017   naproxen sodium 220 MG tablet Commonly known as:  ALEVE Take 220 mg by mouth 2 (two) times daily as needed (for pain or headaches).   pravastatin 80 MG tablet Commonly known as:  PRAVACHOL Take 1 tablet (80 mg total) by mouth every evening.   Vitamin D-3 1000 units Caps Take 3,000 Units by mouth daily.   warfarin 4 MG tablet Commonly known as:  COUMADIN Take 4 mg by mouth every evening.        Brief H and P: For complete details please refer to admission H and P, but in brief [name] a 84 y.o.malewithhistory of CAD status post CABG,mitral valve repair, atrial fibrillation, interstitial lung disease, hypothyroidism and diastolic CHF not on diuretics at homepresents to the ER withshortness of breath due to CHF exacerbation and A. fib with RVR. Started on IV Lasix and Cardizem drip with significant improvement in his breathing the next day.  Hospital Course:  Acute on chronic diastolic CHF, now acute systolic heart failure -BNP 927 at the time of admission, -Troponins negative, patient was placed on IV diuresis, negative balance of 4.8 L -2D echo showed EF of 20 to 25% with severe diffuse hypokinesis, new  -Weight  down from 189.9lbs on admission to 179 lbs at discharge -Patient was transitioned to oral Lasix however due to low BP, Lasix dose was held.  Low EF 20 to 25%, new likely due to persistent tachycardia from atrial fibrillation.  He was not taking beta-blocker. -Due to soft BP he cannot tolerate ACE/ARB.  Currently diuresed well, placed on oral Lasix 20 mg as needed for edema or shortness of breath.  Patient was given instructions on low-sodium diet fluid restriction, daily weights   Atrial fibrillation with RVR, permanent -Patient was initially  placed on IV Cardizem drip.  Cardiology was consulted.  No Cardizem given new low EF.   -Patient was placed on beta-blocker however due to borderline BP, for now continue 12.5 mg metoprolol succinate daily.   -He will follow-up with cardiology outpatient -Continue Coumadin, per patient IN[facility]  -Currently no chest pain, remote history of CABG   Mild acute on CKD stage III -Baseline ~1.6.  Creatinine bumped to 1.8 likely due to diuresis  -patient transitioned to oral Lasix, however Lasix held due to creatinine bump   History of mitral valve repair -Continue Coumadin per outpatient dose  Hyperlipidemia Continue pravastatin   Hypothyroidism Continue Synthroid 50 MCG daily   Day of Discharge S: This morning was more dizzy on waking up but now feels great and wants to go home.  No chest pain or shortness of breath  BP 99/73 (BP Location: Left Arm)   Pulse 95   Temp 97.8 F (36.6 C) (Oral)   Resp 20   Ht 5\' 8"  (1.727 m)   Wt 81.2 kg   SpO2 92%   BMI 27.23 kg/m   Physical Exam: General: Alert and awake oriented x3 not in any acute distress. HEENT: anicteric sclera, pupils reactive to light and accommodation CVS: S1-S2 clear, ireg ireg  Chest: clear to auscultation bilaterally, no wheezing rales or rhonchi Abdomen: soft nontender, nondistended, normal bowel sounds Extremities: no cyanosis, clubbing or edema noted bilaterally Neuro: Cranial nerves II-XII intact, no focal neurological deficits   The results of significant diagnostics from this hospitalization (including imaging, microbiology, ancillary and laboratory) are listed below for reference.      Procedures/Studies:  Dg Chest 2 View  Result Date: 12/24/2017 CLINICAL DATA:  Dyspnea on exertion EXAM: CHEST - 2 VIEW COMPARISON:  02/06/2014 and chest CT 05/16/2015 FINDINGS: Cardiomegaly with aortic atherosclerosis and post CABG change. Chronic interstitial prominence with  basilar atelectasis left greater than right. Equivocal trace posterior pleural effusions. No acute nor suspicious osseous abnormalities. IMPRESSION: 1. Stable cardiomegaly with post CABG change and aortic atherosclerosis. 2. Chronic interstitial lung disease. 3. Bibasilar atelectasis. Equivocal trace posterior pleural effusions. Electronically Signed   By: Ashley Royalty M.D.   On: 12/24/2017 18:37       LAB RESULTS: Basic Metabolic Panel: Recent Labs  Lab 12/24/17 2243  12/26/17 0420 12/27/17 0532  NA  --    < > 138 138  K  --    < > 3.8 3.8  CL  --    < > 102 100  CO2  --    < > 25 26  GLUCOSE  --    < > 106* 97  BUN  --    < > 27* 36*  CREATININE  --    < > 1.89* 1.96*  CALCIUM  --    < > 9.7 9.4  MG 2.0  --   --   --    < > =  values in this interval not displayed.   Liver Function Tests: No results for input(s): AST, ALT, ALKPHOS, BILITOT, PROT, ALBUMIN in the last 168 hours. No results for input(s): LIPASE, AMYLASE in the last 168 hours. No results for input(s): AMMONIA in the last 168 hours. CBC: Recent Labs  Lab 12/24/17 1756 12/25/17 0439  WBC 5.6 6.2  HGB 13.3 12.1*  HCT 41.3 38.1*  MCV 92.6 90.9  PLT 131* 132*   Cardiac Enzymes: Recent Labs  Lab 12/24/17 2243  TROPONINI <0.03   BNP: Invalid input(s): POCBNP CBG: No results for input(s): GLUCAP in the last 168 hours.    Disposition and Follow-up: Discharge Instructions    (HEART FAILURE PATIENTS) Call MD:  Anytime you have any of the following symptoms: 1) 3 pound weight gain in 24 hours or 5 pounds in 1 week 2) shortness of breath, with or without a dry hacking cough 3) swelling in the hands, feet or stomach 4) if you have to sleep on extra pillows at night in order to breathe.   Complete by:  As directed    Diet - low sodium heart healthy   Complete by:  As directed    Increase activity slowly   Complete by:  As directed        DISPOSITION: home    [place]     Buford Dresser, MD. Schedule an appointment as soon as possible for a visit in 2 week(s).   Specialty:  Cardiology Why:  need labs BMET for renal function  Contact information: [address] [place] [address] [phone]            Time coordinating discharge:  35 mins   Signed:   Estill Cotta M.D. [facility] 12/27/2017, 1:55 PM Pager: [phone]

## 2017-12-27 NOTE — Care Management Note (Signed)
Case Management Note  Patient Details  Name: [name] MRN: [id number] Date of Birth: June 20, 1931  Subjective/Objective: Pt presented for Acute on Chronic CHF-PTA from home with wife.                    Action/Plan: CM did discuss [place] with patient. Pt declining services at this time. No home health needs identified.   Expected Discharge Date:                  Expected Discharge Plan:  Home/Self Care  In-House Referral:  NA  Discharge planning Services  CM Consult  Post Acute Care Choice:  NA Choice offered to:  NA  DME Arranged:  N/A DME Agency:  NA  HH Arranged:  NA HH Agency:  NA  Status of Service:  Completed, signed off  If discussed at [place] of Stay Meetings, dates discussed:    Additional Comments:  Bethena Roys, RN 12/27/2017, 10:01 AM

## 2017-12-27 NOTE — Discharge Instructions (Addendum)
Atrial Fibrillation Atrial fibrillation is a type of heartbeat that is irregular or fast (rapid). If you have this condition, your heart keeps quivering in a weird (chaotic) way. This condition can make it so your heart cannot pump blood normally. Having this condition gives a person more risk for stroke, heart failure, and other heart problems. There are different types of atrial fibrillation. Talk with your doctor to learn about the type that you have. Follow these instructions at home:  Take over-the-counter and prescription medicines only as told by your doctor.  If your doctor prescribed a blood-thinning medicine, take it exactly as told. Taking too much of it can cause bleeding. If you do not take enough of it, you will not have the protection that you need against stroke and other problems.  Do not use any tobacco products. These include cigarettes, chewing tobacco, and e-cigarettes. If you need help quitting, ask your doctor.  If you have apnea (obstructive sleep apnea), manage it as told by your doctor.  Do not drink alcohol.  Do not drink beverages that have caffeine. These include coffee, soda, and tea.  Maintain a healthy weight. Do not use diet pills unless your doctor says they are safe for you. Diet pills may make heart problems worse.  Follow diet instructions as told by your doctor.  Exercise regularly as told by your doctor.  Keep all follow-up visits as told by your doctor. This is important. Contact a doctor if:  You notice a change in the speed, rhythm, or strength of your heartbeat.  You are taking a blood-thinning medicine and you notice more bruising.  You get tired more easily when you move or exercise. Get help right away if:  You have pain in your chest or your belly (abdomen).  You have sweating or weakness.  You feel sick to your stomach (nauseous).  You notice blood in your throw up (vomit), poop (stool), or pee (urine).  You are short of  breath.  You suddenly have swollen feet and ankles.  You feel dizzy.  Your suddenly get weak or numb in your face, arms, or legs, especially if it happens on one side of your body.  You have trouble talking, trouble understanding, or both.  Your face or your eyelid droops on one side. These symptoms may be an emergency. Do not wait to see if the symptoms will go away. Get medical help right away. Call your local emergency services (911 in the [place] Do not drive yourself to the hospital. This information is not intended to replace advice given to you by your health care provider. Make sure you discuss any questions you have with your health care provider. Document Released: 12/13/2007 Document Revised: 08/11/2015 Document Reviewed: 06/30/2014 Elsevier Interactive Patient Education  2018 [place] on my medicine - Coumadin   (Warfarin)  Why was Coumadin prescribed for you? Coumadin was prescribed for you because you have a blood clot or a medical condition that can cause an increased risk of forming blood clots. Blood clots can cause serious health problems by blocking the flow of blood to the heart, lung, or brain. Coumadin can prevent harmful blood clots from forming. As a reminder your indication for Coumadin is:   Stroke Prevention Because Of Atrial Fibrillation  What test will check on my response to Coumadin? While on Coumadin (warfarin) you will need to have an INR test regularly to ensure that your dose is keeping you in the desired range.  The INR (international normalized ratio) number is calculated from the result of the laboratory test called prothrombin time (PT).  If an INR APPOINTMENT HAS NOT ALREADY BEEN MADE FOR YOU please schedule an appointment to have this lab work done by your health care provider within 7 days. Your INR goal is usually a number between:  2 to 3 or your provider may give you a more narrow range like 2-2.5.  Ask your health care provider  during an office visit what your goal INR is.  What  do you need to  know  About  COUMADIN? Take Coumadin (warfarin) exactly as prescribed by your healthcare provider about the same time each day.  DO NOT stop taking without talking to the doctor who prescribed the medication.  Stopping without other blood clot prevention medication to take the place of Coumadin may increase your risk of developing a new clot or stroke.  Get refills before you run out.  What do you do if you miss a dose? If you miss a dose, take it as soon as you remember on the same day then continue your regularly scheduled regimen the next day.  Do not take two doses of Coumadin at the same time.  Important Safety Information A possible side effect of Coumadin (Warfarin) is an increased risk of bleeding. You should call your healthcare provider right away if you experience any of the following: ? Bleeding from an injury or your nose that does not stop. ? Unusual colored urine (red or dark brown) or unusual colored stools (red or black). ? Unusual bruising for unknown reasons. ? A serious fall or if you hit your head (even if there is no bleeding).  Some foods or medicines interact with Coumadin (warfarin) and might alter your response to warfarin. To help avoid this: ? Eat a balanced diet, maintaining a consistent amount of Vitamin K. ? Notify your provider about major diet changes you plan to make. ? Avoid alcohol or limit your intake to 1 drink for women and 2 drinks for men per day. (1 drink is 5 oz. wine, 12 oz. beer, or 1.5 oz. liquor.)  Make sure that ANY health care provider who prescribes medication for you knows that you are taking Coumadin (warfarin).  Also make sure the healthcare provider who is monitoring your Coumadin knows when you have started a new medication including herbals and non-prescription products.  Coumadin (Warfarin)  Major Drug Interactions  Increased Warfarin Effect Decreased Warfarin Effect   Alcohol (large quantities) Antibiotics (esp. Septra/Bactrim, Flagyl, Cipro) Amiodarone (Cordarone) Aspirin (ASA) Cimetidine (Tagamet) Megestrol (Megace) NSAIDs (ibuprofen, naproxen, etc.) Piroxicam (Feldene) Propafenone (Rythmol SR) Propranolol (Inderal) Isoniazid (INH) Posaconazole (Noxafil) Barbiturates (Phenobarbital) Carbamazepine (Tegretol) Chlordiazepoxide (Librium) Cholestyramine (Questran) Griseofulvin Oral Contraceptives Rifampin Sucralfate (Carafate) Vitamin K   Coumadin (Warfarin) Major Herbal Interactions  Increased Warfarin Effect Decreased Warfarin Effect  Garlic Ginseng Ginkgo biloba Coenzyme Q10 Green tea [place]. Johns wort    Coumadin (Warfarin) FOOD Interactions  Eat a consistent number of servings per week of foods HIGH in Vitamin K (1 serving =  cup)  Collards (cooked, or boiled & drained) Kale (cooked, or boiled & drained) Mustard greens (cooked, or boiled & drained) Parsley *serving size only =  cup Spinach (cooked, or boiled & drained) Swiss chard (cooked, or boiled & drained) Turnip greens (cooked, or boiled & drained)  Eat a consistent number of servings per week of foods MEDIUM-HIGH in Vitamin K (1 serving = 1 cup)  Asparagus (cooked, or  boiled & drained) Broccoli (cooked, boiled & drained, or raw & chopped) Brussel sprouts (cooked, or boiled & drained) *serving size only =  cup Lettuce, raw (green leaf, endive, romaine) Spinach, raw Turnip greens, raw & chopped   These websites have more information on Coumadin (warfarin):  FailFactory.se; VeganReport.com.au;

## 2017-12-30 NOTE — Progress Notes (Signed)
Noted. Thank you for following up with him.   [name]

## 2017-12-30 NOTE — Consult Note (Signed)
            [facility] CM Primary Care Navigator  12/30/2017  [name] 1932-02-14 [id number]   Attempt to seepatient at the bedside to identify possible discharge needs buthe wasalreadydischargedhometowards the weekend.  Per MD note,patientpresented to the ER withshortness of breath due to congestive HF exacerbation and atrial fibrillation with rapid ventricular response, IV Lasix and Cardizem drip initiated with significant improvement in his breathing.   Patient has discharge instruction to follow-up withcardiology in 1- 2 weeks. Per chart review, patient has no primary care provider listed but he is being followed by provider from [facility] clinic.   For additional questions please contact:  Edwena Felty A. Yaritsa Savarino, BSN, RN-BC [facility] PRIMARY CARE Navigator Cell: [phone]

## 2017-12-31 DIAGNOSIS — I2510 Atherosclerotic heart disease of native coronary artery without angina pectoris: Secondary | ICD-10-CM | POA: Diagnosis not present

## 2017-12-31 DIAGNOSIS — N183 Chronic kidney disease, stage 3 (moderate): Secondary | ICD-10-CM | POA: Diagnosis not present

## 2017-12-31 DIAGNOSIS — I480 Paroxysmal atrial fibrillation: Secondary | ICD-10-CM | POA: Diagnosis not present

## 2017-12-31 DIAGNOSIS — E039 Hypothyroidism, unspecified: Secondary | ICD-10-CM | POA: Diagnosis not present

## 2017-12-31 DIAGNOSIS — I10 Essential (primary) hypertension: Secondary | ICD-10-CM | POA: Diagnosis not present

## 2017-12-31 DIAGNOSIS — E7800 Pure hypercholesterolemia, unspecified: Secondary | ICD-10-CM | POA: Diagnosis not present

## 2017-12-31 DIAGNOSIS — I509 Heart failure, unspecified: Secondary | ICD-10-CM | POA: Diagnosis not present

## 2018-01-01 ENCOUNTER — Telehealth: Payer: Self-pay

## 2018-01-01 NOTE — Telephone Encounter (Signed)
FAXED NOTES TO NL 

## 2018-01-02 ENCOUNTER — Ambulatory Visit (INDEPENDENT_AMBULATORY_CARE_PROVIDER_SITE_OTHER)
Admission: RE | Admit: 2018-01-02 | Discharge: 2018-01-02 | Disposition: A | Discharge status: Home / Self Care | Payer: PPO | Source: Ambulatory Visit | Attending: Pulmonary Disease | Admitting: Pulmonary Disease

## 2018-01-02 DIAGNOSIS — J849 Interstitial pulmonary disease, unspecified: Secondary | ICD-10-CM

## 2018-01-03 NOTE — Progress Notes (Signed)
Your CT results of come back.  When compared to February/2018 CT findings are stable.  This is good news.  Keep follow-up with our office.  Wyn Quaker FNP

## 2018-01-06 ENCOUNTER — Telehealth (HOSPITAL_COMMUNITY): Payer: Self-pay | Admitting: Cardiology

## 2018-01-06 NOTE — Telephone Encounter (Signed)
Returned call to patient's wife. She is concerned about his HR being elevated. HR runs 90s-120s. BP runs 110s/65-70. He is feeling better since hospitalization - wife states he has made good progress.    Advised would route message to MD/RN to review and advise and they would get a call back with recommendations

## 2018-01-06 NOTE — Telephone Encounter (Signed)
New message   Pt c/o medication issue:  1. Name of Medication:metoprolol succinate (TOPROL-XL) 25 MG 24 hr tablet  2. How are you currently taking this medication (dosage and times per day)? 1/2 tablet daily  3. Are you having a reaction (difficulty breathing--STAT)? No   4. What is your medication issue? Patient's wife states that his hr is still around 114. Today when he got up from sleep it was around 90. Patient's wife wants to know if he should take a whole pill. Please advise.

## 2018-01-07 NOTE — Telephone Encounter (Signed)
I am glad that the heart rates are not as high as they had been in the hospital. I'd like to increase the dose of metoprolol, but with the borderline blood pressures I don't want to overshoot. Any lightheadedness/dizzyness? If so I wouldn't increase the metoprolol yet. If he is feeling ok, and as long as blood pressures stay >840 systolic, we can go up very slightly on the dose of metoprolol. Glad he is feeling better!

## 2018-01-07 NOTE — Telephone Encounter (Signed)
Spoke with Wife and informed that per Dr. Harrell Gave, she would like to continue pt on current dose of metoprolol. Wife verbalized understanding.

## 2018-01-07 NOTE — Telephone Encounter (Signed)
Returned call to pt's wife. She states pt is feeling much better and denies any c/o of lightheadedness or dizziness. She states 5 days ago pt's HR was 124 but since has bee running in the 90's. This morning BP was 95/62 HR 78. Wife states they are really pleased with progress. Will route to MD for an update.

## 2018-01-22 ENCOUNTER — Encounter: Payer: Self-pay | Admitting: Internal Medicine

## 2018-01-22 ENCOUNTER — Ambulatory Visit: Payer: PPO | Admitting: Internal Medicine

## 2018-01-22 ENCOUNTER — Ambulatory Visit (INDEPENDENT_AMBULATORY_CARE_PROVIDER_SITE_OTHER): Payer: PPO | Admitting: Internal Medicine

## 2018-01-22 ENCOUNTER — Ambulatory Visit (INDEPENDENT_AMBULATORY_CARE_PROVIDER_SITE_OTHER): Payer: PPO | Admitting: Cardiology

## 2018-01-22 ENCOUNTER — Encounter: Payer: Self-pay | Admitting: Cardiology

## 2018-01-22 VITALS — BP 130/80 | Ht 68.0 in | Wt 188.0 lb

## 2018-01-22 VITALS — BP 100/70 | HR 108 | Ht 68.0 in | Wt 188.9 lb

## 2018-01-22 DIAGNOSIS — J849 Interstitial pulmonary disease, unspecified: Secondary | ICD-10-CM | POA: Diagnosis not present

## 2018-01-22 DIAGNOSIS — Z7901 Long term (current) use of anticoagulants: Secondary | ICD-10-CM | POA: Diagnosis not present

## 2018-01-22 DIAGNOSIS — I2510 Atherosclerotic heart disease of native coronary artery without angina pectoris: Secondary | ICD-10-CM | POA: Diagnosis not present

## 2018-01-22 DIAGNOSIS — R0609 Other forms of dyspnea: Secondary | ICD-10-CM

## 2018-01-22 DIAGNOSIS — I4891 Unspecified atrial fibrillation: Secondary | ICD-10-CM | POA: Diagnosis not present

## 2018-01-22 DIAGNOSIS — N183 Chronic kidney disease, stage 3 unspecified: Secondary | ICD-10-CM

## 2018-01-22 DIAGNOSIS — I350 Nonrheumatic aortic (valve) stenosis: Secondary | ICD-10-CM

## 2018-01-22 DIAGNOSIS — I5042 Chronic combined systolic (congestive) and diastolic (congestive) heart failure: Secondary | ICD-10-CM

## 2018-01-22 DIAGNOSIS — Z9889 Other specified postprocedural states: Secondary | ICD-10-CM

## 2018-01-22 DIAGNOSIS — J84112 Idiopathic pulmonary fibrosis: Secondary | ICD-10-CM | POA: Diagnosis not present

## 2018-01-22 LAB — PULMONARY FUNCTION TEST
DL/VA % pred: 105 %
DL/VA: 4.76 ml/min/mmHg/L
DLCO COR: 21.27 ml/min/mmHg
DLCO UNC % PRED: 63 %
DLCO UNC: 19.6 ml/min/mmHg
DLCO cor % pred: 68 %
FEF 25-75 PRE: 1.64 L/s
FEF2575-%Pred-Pre: 94 %
FEV1-%Pred-Pre: 76 %
FEV1-Pre: 1.99 L
FEV1FVC-%PRED-PRE: 110 %
FEV6-%PRED-PRE: 73 %
FEV6-Pre: 2.55 L
FEV6FVC-%Pred-Pre: 107 %
FVC-%PRED-PRE: 68 %
FVC-Pre: 2.55 L
PRE FEV6/FVC RATIO: 100 %
Pre FEV1/FVC ratio: 78 %

## 2018-01-22 NOTE — Patient Instructions (Signed)
Medication Instruction If both Heart Rate and Systolic is over 155 take a full dose of 25 mg Metoprolol. It either number is less than 100 take 1/2 pill 12.5 mg. If you need a refill on your cardiac medications before your next appointment, please call your pharmacy.   Lab work: None If you have labs (blood work) drawn today and your tests are completely normal, you will receive your results only by: Marland Kitchen MyChart Message (if you have MyChart) OR . A paper copy in the mail If you have any lab test that is abnormal or we need to change your treatment, we will call you to review the results.  Testing/Procedures: None  Follow-Up: At [facility], you and your health needs are our priority.  As part of our continuing mission to provide you with exceptional heart care, we have created designated Provider Care Teams.  These Care Teams include your primary Cardiologist (physician) and Advanced Practice Providers (APPs -  Physician Assistants and Nurse Practitioners) who all work together to provide you with the care you need, when you need it. You will need a follow up appointment in 4 weeks.  Please call our office 2 months in advance to schedule this appointment.  You may see Buford Dresser, MD or one of the following Advanced Practice Providers on your designated Care Team:   Rosaria Ferries, PA-C . Jory Sims, DNP, ANP  Any Other Special Instructions Will Be Listed Below (If Applicable).

## 2018-01-22 NOTE — Progress Notes (Signed)
Cardiology Office Note:    Date:  01/22/2018   ID:  [name], DOB [birth year], MRN [id number]  PCP:  [name], MD  Cardiologist:  Buford Dresser, MD PhD  Referring MD: [name], MD   CC: post hospital follow up  History of Present Illness:    [name] is a 84 y.o. male with a hx of CAD s/p CABG, mitral valve repair, atrial fibrillation on anticoagulation with coumadin (managed by [facility]), interstitial lung disease, chronic diastolic heart failure who is seen in follow up today for the evaluation and management of systolic and diastolic heart failure, atrial fibrillation.   I met [name] during his recent hospitalization. He was discharged on 12/27/17 after hospitalization for acute on chronic diastolic heart failure and atrial fibrillation with rapid ventricular response. During his hospitalization, he had a new drop in his ejection fraction, consistent with new acute systolic heart failure in addition to his diastolic heart failure. He was diuresed >5 L, with weight change from 190 lbs to 179 lbs at discharge. His hypotension had limited rate control agents in the past, and he was not on a beta blocker at the time of admission. He was titrated onto a low dose of beta blocker, limited by symptoms. His blood pressure was too low to add ACEI/ARB.   He brings with him a chart of his blood pressures and weights today. His weights have been very stable, with a range of 176-182. He took lasix once since discharge.  His blood pressure and heart rates are variable. Overall he feels well and can neither tell when his blood pressure is low or his heart rate is high.   His blood pressure averages 100-110/60s, but the lowest readings were 80/60 (HR 109) and 92/42 (HR 100). His heart rates average around 100 as well, with the lowest at 70 (BP 95/60) and highest at 125 (BP 109/75). He is able to walk, climbs stairs slowly. He does note that his heart rate elevates rapidly with  any exertion. He tries to get activity in daily, going back to the gym to increase his tolerance.  He was seen at the [facility] in [place], and they asked him to ask about digoxin. We discussed this medication at length, as well as the risks associated with long term use. He does not want to try this medication. He has also been on amiodarone in the past; we discussed given the permanence of his afib that he is unlikely to convert on this, and the rate control may be difficult. We also discussed DOACs vs. Coumadin. He wishes to discuss this with the [facility] since his coumadin is free.  Past Medical History:  Diagnosis Date  . Adenomatous colon polyp   . Aortic stenosis    a. mild by echo 2017.  Marland Kitchen CAD (coronary artery disease)    a. S/P CABG x 1 @ time of MV annuloplasty;  b. 02/2010 ETT: neg for ischemia.  . Carotid artery stenosis    1-39% by dopplers 10/2016  . Chronic diastolic CHF (congestive heart failure) ([place])   . CKD (chronic kidney disease), stage III ([place])   . Current use of long term anticoagulation    a. Coumadin in setting of afib.  . Diverticulosis    Colonoscopy  . Dyslipidemia   . Essential hypertension   . H/O mitral valve repair   . Hemorrhoids   . Hyperlipidemia   . Hypertension   . Permanent atrial fibrillation    a.  CHA2DS2VASc = 6 -->chronic coumadin;  b. Prev on amio-->d/c 2/2 hypothyroidism. >> resumed 5/16. c. Notes from 2017 indicate interstitial lung disease by pulm appt, question amio toxicity, also increased liver attenuation. Rate control pursued and amiodarone discontinued.  Marland Kitchen TIA (transient ischemic attack)     Past Surgical History:  Procedure Laterality Date  . CORONARY ARTERY BYPASS GRAFT    . MITRAL VALVE REPAIR      Current Medications: Current Outpatient Medications on File Prior to Visit  Medication Sig  . amoxicillin (AMOXIL) 500 MG capsule Take 2,000 mg by mouth See admin instructions. Take 2,000 mg by mouth one hour prior to dental visits every  6 months  . Cholecalciferol (VITAMIN D-3) 1000 units CAPS Take 3,000 Units by mouth daily.  . furosemide (LASIX) 20 MG tablet Take 1 tablet (20 mg total) by mouth daily as needed for fluid or edema (shortness of breath).  . hydrocortisone (ANUSOL-HC) 2.5 % rectal cream Place 1 application rectally 2 (two) times daily. (Patient taking differently: Place 1 application rectally 2 (two) times daily as needed for hemorrhoids. )  . levothyroxine (SYNTHROID, LEVOTHROID) 50 MCG tablet Take 50 mcg by mouth at bedtime.   . metoprolol succinate (TOPROL-XL) 25 MG 24 hr tablet Take 0.5 tablets (12.5 mg total) by mouth daily.  . naproxen sodium (ALEVE) 220 MG tablet Take 220 mg by mouth 2 (two) times daily as needed (for pain or headaches).  . pravastatin (PRAVACHOL) 80 MG tablet Take 1 tablet (80 mg total) by mouth every evening.  . warfarin (COUMADIN) 4 MG tablet Take 4 mg by mouth every evening.   No current facility-administered medications on file prior to visit.      Allergies:   Asa [aspirin] and Flomax [tamsulosin]   Social History   Socioeconomic History  . Marital status: Married    Spouse name: Not on file  . Number of children: Not on file  . Years of education: Not on file  . Highest education level: Not on file  Occupational History  . 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 Financial resource strain: Not on file  . Food insecurity:    Worry: Not on file    Inability: Not on file  . Transportation needs:    Medical: Not on file    Non-medical: Not on file  Tobacco Use  . Smoking status: Former Smoker    Packs/day: 0.10    Years: 2.00    Pack years: 0.20    Types: Cigarettes    Last attempt to quit: 03/20/1955    Years since quitting: 62.8  . Smokeless tobacco: Never Used  Substance and Sexual Activity  . Alcohol use: No    Alcohol/week: 0.0 standard drinks  . Drug use: No  . Sexual activity: Not Currently  Lifestyle  . Physical activity:    Days  per week: Not on file    Minutes per session: Not on file  . Stress: Not on file  Relationships  . Social connections:    Talks on phone: Not on file    Gets together: Not on file    Attends religious service: Not on file    Active member of club or organization: Not on file    Attends meetings of clubs or organizations: Not on file    Relationship status: Not on file  Other Topics Concern  . Not on file  Social History Narrative  . Not on file  Family History: The patient's family history includes Heart disease in his father; Heart failure in his brother; Stroke in his brother and mother.  ROS:   Please see the history of present illness.  Additional pertinent ROS:  Constitutional: Negative for chills, fever, night sweats, unintentional weight loss  HENT: Negative for ear pain and hearing loss.   Eyes: Negative for loss of vision and eye pain.  Respiratory: Positive for dyspnea on exertion. Negative for cough, sputum, shortness of breath at rest, wheezing.   Cardiovascular: Negative for chest pain, palpitations , PND, orthopnea, lower extremity edema and claudication.  Gastrointestinal: Negative for abdominal pain, melena, and hematochezia.  Genitourinary: Negative for dysuria and hematuria.  Musculoskeletal: Negative for falls and myalgias.  Skin: Negative for itching and rash.  Neurological: Negative for focal weakness, focal sensory changes and loss of consciousness.  Endo/Heme/Allergies: Does bruise/bleed easily.    EKGs/Labs/Other Studies Reviewed:    The following studies were reviewed today:  Echo 12/25/17 Study Conclusions - Left ventricle: The cavity size was mildly dilated. Systolic function was severely reduced. The estimated ejection fraction was in the range of 20% to 25%. Severe diffuse hypokinesis. The study was not technically sufficient to allow evaluation of LV diastolic dysfunction due to atrial fibrillation. Doppler parameters are  consistent with high ventricular filling pressure. - Aortic valve: There was mild regurgitation. Valve area (VTI): 0.7 cm^2. Valve area (Vmax): 0.85 cm^2. Valve area (Vmean): 0.76 cm^2. - Mitral valve: S/P prior MV repair Moderate diffuse thickening and calcification of the anterior leaflet and posterior leaflet, with mild involvement of chords. Mobility of the posterior leaflet was restricted to the point of immobility. There was mild regurgitation. - Left atrium: The atrium was mildly dilated. - Right ventricle: Systolic function was mildly reduced. - Tricuspid valve: There was trivial regurgitation. - Pulmonary arteries: PA peak pressure: 48 mm Hg (S).  Impressions: - Mildly dilated LV with severe LV dysfunction. Cannot assess diastolic function due to underlying atrial fibrillation but there is increased LV filling pressures. S/P MV repair with mild MR and moderately thickened MV leaflets. The posterior leaflet appears fixed. The AV is poorly visualized but there is calcification of the valve. Cannot assess degree of AS given poor visualization and gradients may be falsely low in setting of LV dysfunction. Consider TEE for further evaluation.  EKG:  Personally reviewed. The ekg ordered today demonstrates atrial fibrillation with RVR, rate 108 bpm.  Recent Labs: 06/17/2017: ALT 15 12/24/2017: B Natriuretic Peptide 927.0; Magnesium 2.0; NT-Pro BNP 10,355; TSH 3.558 12/25/2017: Hemoglobin 12.1; Platelets 132 12/27/2017: BUN 36; Creatinine, Ser 1.96; Potassium 3.8; Sodium 138  Recent Lipid Panel    Component Value Date/Time   CHOL 193 06/17/2017 0744   TRIG 114 06/17/2017 0744   HDL 35 (L) 06/17/2017 0744   CHOLHDL 5.5 (H) 06/17/2017 0744   CHOLHDL 2.6 03/16/2016 0903   VLDL 20 03/16/2016 0903   LDLCALC 135 (H) 06/17/2017 0744    Physical Exam:    VS:  BP 100/70   Pulse (!) 108   Ht 5' 8"  (1.727 m)   Wt 188 lb 14.1 oz (85.7 kg)   BMI 28.72  kg/m     Wt Readings from Last 3 Encounters:  01/22/18 188 lb (85.3 kg)  01/22/18 188 lb 14.1 oz (85.7 kg)  12/27/17 179 lb 1.6 oz (81.2 kg)     GEN: Well nourished, well developed in no acute distress HEENT: Normal NECK: JVD at clavicle at 90 degrees; No carotid bruits  LYMPHATICS: No lymphadenopathy CARDIAC: regular rhythm, normal S1 and S2, no rubs, gallops. 2/6 Early to mid peaking SEM with audible S2. Radial and DP pulses 2+ bilaterally. RESPIRATORY:  Largely clear bilaterally except for fine crackles throughout. No rales. ABDOMEN: Soft, non-tender, non-distended MUSCULOSKELETAL:  No edema; No deformity  SKIN: Warm and dry NEUROLOGIC:  Alert and oriented x 3 PSYCHIATRIC:  Normal affect   ASSESSMENT:    1. Atrial fibrillation with RVR ([place])   2. Coronary artery disease involving native coronary artery without angina pectoris, unspecified whether native or transplanted heart   3. Chronic combined systolic and diastolic congestive heart failure ([place])   4. ILD (interstitial lung disease) ([place])   5. Chronic kidney disease (CKD), stage III (moderate) ([facility])   6. H/O mitral valve repair   7. Long term (current) use of anticoagulants [Z79.01]   8. Dyspnea on exertion   9. Aortic stenosis, mild    PLAN:   Chronic diastolic CHF with recently diagnosed systolic heart failure:  -appears euvolemic today. Only required one dose of lasix since discharge. Weight here up from discharge, but he follows it very closely at home. -newly reduced EF. I suspect that it is due to the persist tachycardia he has had for an undetermined about of time as an outpatient. However, there is some concern for valvular changes as well, as his AS could not be well evaluated on chest wall echo. No chest pain, negative troponins. I will continue to try to optimize his heart rate and then repeat echo. If EF remains reduced, may need to discuss cath with measurement of aortic gradient, but with his CKD would like to  avoid contrast dye if possible. -has a history of CABG, no cath in our system -heart failure education with daily weights, fluid restrictions, salt avoidance -his blood pressure cannot tolerate ACEI/ARB/ARNI/MRA at this point.  Dyspnea on exertion: reviewed his chart. Followed by pulmonology as well. Concern for interstitial lung disease/ IPF, discussing treatment options with pulmonology.  Atrial Fibrillation w/ RVR: rates in the 120s on admission recently, with elevations much high with exertion. Post discharge, rates have ranged from 70-125 with most around 100. Goal would be for HR <90 bpm on a routine basis, but need to not drop his BP either. -AV nodal blocking agents has been limited due to soft BP. -[facility] -discussed risk/benefit of digoxin. He prefers not to try given long term concerns. -was on amiodarone previously. With ILD/IPF, would not retrial, and with permanent atrial fibrillation unlikely to rhythm control him -no cardizem given new low EF -titrating metoprolol--borderline BP with 12.5 mg metoprolol succinate daily. We discussed options for titrating at length with patient and his wife. They would like to be conservative with changes. Therefore, the plan will be:  -if both systolic BP and heart rate are >100, he will take a full pill (25 mg) of metoprolol succinate  -if EITHER systolic BP or heart rate <100, then he will take a half pill (12.5 mg) of metoprolol succinate -they will let me know if this plan isn't well tolerate.  CAD: remote h/o CABG. Denies CP.  Mitral Regurgitation: h/o MV repair. Mild MR on echo. Aortic Stenosis: mild AS noted on prior echo in 2017. Unclear level of stenosis on most recent echo. Chronic Anticoagulation: on coumadin for atrial fibrillation.  [facility] as [facility]. We discussed that he may be a candidate for reduced dose apixaban (given age and renal function); he would  be 2.5 mg BID apixaban. [facility]. CKD: has not required frequent diuretics. Monitored by the [facility], but if no labs available at the next visit, would recheck  Plan for follow up: 4 weeks  Medication Adjustments/Labs and Tests Ordered: Current medicines are reviewed at length with the patient today.  Concerns regarding medicines are outlined above.  Orders Placed This Encounter  Procedures  . EKG 12-Lead   No orders of the defined types were placed in this encounter.   Patient Instructions  Medication Instruction If both Heart Rate and Systolic is over 543 take a full dose of 25 mg Metoprolol. It either number is less than 100 take 1/2 pill 12.5 mg. If you need a refill on your cardiac medications before your next appointment, please call your pharmacy.   Lab work: None If you have labs (blood work) drawn today and your tests are completely normal, you will receive your results only by: Marland Kitchen MyChart Message (if you have MyChart) OR . A paper copy in the mail If you have any lab test that is abnormal or we need to change your treatment, we will call you to review the results.  Testing/Procedures: None  Follow-Up: At [facility], you and your health needs are our priority.  As part of our continuing mission to provide you with exceptional heart care, we have created designated Provider Care Teams.  These Care Teams include your primary Cardiologist (physician) and Advanced Practice Providers (APPs -  Physician Assistants and Nurse Practitioners) who all work together to provide you with the care you need, when you need it. You will need a follow up appointment in 4 weeks.  Please call our office 2 months in advance to schedule this appointment.  You may see Buford Dresser, MD or one of the following Advanced Practice Providers on your designated Care Team:   Rosaria Ferries, PA-C . Jory Sims, DNP, ANP  Any Other Special Instructions Will Be Listed Below (If  Applicable).       Signed, Buford Dresser, MD PhD 01/22/2018 8:04 AM    [place]

## 2018-01-22 NOTE — Progress Notes (Signed)
PCP [name], MD   HPI   IOV 09/09/2015  Chief Complaint  Patient presents with  . Advice Only    Referred by Dr. Radford Pax for abnormal PFT.  PFT from 08/31/15 in [place]. Pt has no breathing complaints.     84 year old male referred by Dr. Radford Pax in cardiology for abnormal pulmonary function test. He presents with his wife. At baseline he says he does not have any problems. Only thing was in 2015 he got admitted for weakness according to chart review. At that time he had a CT scan of the chest high resolution that was read by thoracic radiology and the possibility of bilateral bibasal atelectasis versus subtle interstitial lung disease was raised. However he is continued to remain asymptomatic. Sometime a year ago he was started on amiodarone for a few weeks but then most recently a few months ago went back on amiodarone because of atrial fibrillation according to him and chart review and his wife. He subsequently had pulmonary function test Pulmonary function test personally visualized and done on 08/31/2015 was normal except for mild restriction on total lung capacity 5.14 L/74% and slight reduction in DLCO 23.26/75%. Correlating with this is a 2015 CT chest read by thoracic radiologist suggesting possible ILD versus basal atelectasis  Therefore he has been referred here. He is puzzled by the referral because he feels he is asymptomatic. He works out regularly at fitness and feels well.   OV 05/01/2016  Chief Complaint  Patient presents with  . Follow-up    Pt states his SOB is unchanged since last OV. Pt states he is getting over a URI, c/o prod cough with clear mucus. Pt denies SOB and CP/tightness.    [name] seen in the summer of 2017. At that time we did a CT scan of the chest that suggested persistent but stable interstitial lung disease findings compared to November 2015 but did show amio  Liver depisotis. He was asymptomatic at that time. The pain of starting  amiodarone was in the interim. He tells me now that he continues to be asymptomatic. His wife also agrees with him. He works out at at [employer] doing [employer] and aerobics. Last week he did have a respiratory infection and was down in bed for 1 day but says he is back to baseline. However pulmonary function test documented below shows a decline in forced vital capacity and diffusion capacity. I personally reviewed this result and visualized graph. Walking desaturation test 185 feet 3 laps on room air in our office: lowest pulse ox 98% and asymptomatic HR 121   He is off amio for few months per wife and him  ACCP ILD questionnaire done by his wife on his behlf - cough: mild, occ, day time x months. Denies dyspnea - smoking: started cig at age 25 2 cig/da . Quit after 20 years. REports yes for rec drugs nos - family hx lung disease: denies - home exposure - none - ocupationa: farmm work, Administrator, dairy farm, [place] - meds: amio hx asbove +   OV 05/25/2016  Chief Complaint  Patient presents with  . Follow-up    pt states he is doing good, he exercises three times a week   Here to discuss ild workup PFT declined sinc June 2017 byut radioloigists feel no change in CT since June 2017 Autoimmune negative He and wife do not want biopsy though indicated    Results for [name], [facility]  [name] (MRN [id number]) as of 05/25/2016 11:02  Ref. Range 05/01/2016 10:59 05/01/2016 10:59  Sed Rate Latest Ref Range: 0 - 20 mm/hr 12   Anit Nuclear Antibody(ANA) Latest Ref Range: NEGATIVE  NEG NEG  Angiotensin-Converting Enzyme Latest Ref Range: 9 - 67 U/L 35   Cyclic Citrullin Peptide Ab Latest Units: Units <16 <16  ds DNA Ab Latest Units: IU/mL <1   RA Latex Turbid. Latest Ref Range: <14 IU/mL <14   Ribonucleic Protein(ENA) Antibody, IgG Latest Ref Range: <1.0 NEG AI  <1.0 NEG   SSA (Ro) (ENA) Antibody, IgG Latest Ref Range: <1.0 NEG AI  <1.0 NEG   SSB (La) (ENA) Antibody, IgG Latest Ref  Range: <1.0 NEG AI  <1.0 NEG   Scleroderma (Scl-70) (ENA) Antibody, IgG Latest Ref Range: <1.0 NEG AI  <1.0 NEG    IMPRESSION: 1. 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 2. Aortic atherosclerosis, in addition to left main and 3 vessel coronary artery disease. Status post median sternotomy for CABG. 3. There are calcifications of the aortic valve. Echocardiographic correlation for evaluation of potential valvular dysfunction may be warranted if clinically indicated. 4. The severity of disease appears essentially Unchanged (compareison June 2017(). Minimal cylindrical traction bronchiectasis is noted in the basal segments of the lower lobes of the lungs bilaterally. A small amount of peripheral bronchiolectasis is also noted. No honeycombing identifi  Electronically Signed   By: Vinnie Langton M.D.   On: 05/15/2016 16:33     OV 11/21/2016  Chief Complaint  Patient presents with  . Follow-up    Pt here after PFT. Pt states his breathing is unchanged since last OV. Pt denies cough, SOB, CP/tightness.    84 year old male with undifferentiated interstitial lung disease stable since 2015. Clinically deemed unrelated to amiodarone taken briefly 2017  Last visit was in March 2018. With his wife. He presents again with his wife. In the interim he continues to exercise daily at [employer]. He lifts weights. In fact is able to lift more weights than before. He does not have any dyspnea of exertion. He does not want to have lung biopsy. He is on basic supportive care with observation therapy. He again declined lung biopsy. In fact T-cell immunity does not want to do a 6 month follow-up and wants to keep an open-ended follow-up. His pulmonary function test today stable. Walking  desaturation test resting pulse ox 96% final pulse ox 97% after 185 feet 3 laps on room air. His heart rate jumped from 91/m 130/m.    OV 01/22/2018  Subjective:  Patient ID: [name], male , DOB: 05/22/1931 , age 26 y.o. , MRN: [id number] , ADDRESS: [address] [place] [address]   01/22/2018 -   Chief Complaint  Patient presents with  . Follow-up   84 year old male with undifferentiated interstitial lung disease stable since 2015. Clinically deemed unrelated to amiodarone taken briefly 2017  HPI Jen Eppinger 84 y.o. -presents for one-year follow-up of his ILD not otherwise specified.  I called him clinically undifferentiated interstitial lung disease with negative autoimmune profile although by age and male gender he should be IPF especially with negative serology.  He tells me for the last few months has had increased shortness of breath on exertion particularly with stairs.  He had an admission for heart failure  according to his history and confirmed on review of the chart in October 2019.  After that he is improved according to his wife but he tells me that he is still more short of breath than baseline.  It is relieved by rest.  There is no associated chest pain.  He had pulmonary function test today that shows a decline in lung function compared to one year showing progressive or suggestive of progressive ILD.  He did have a high-resolution CT chest that is read as indeterminate by 2018 ATS criteria.  According to the radiologist there is no progression.     Results for TRYSTYN, DOLLEY (MRN [id number]) as of 01/22/2018 11:33  Ref. Range 08/31/2015 12:32 04/02/2016 10:33 11/21/2016 09:53 01/22/2018 10:28  FVC-Pre Latest Units: L 3.24 3.05 3.08 2.55  FVC-%Pred-Pre Latest Units: % 85 80 82 68   Results for GRAVES, NIPP (MRN [id number]) as of 01/22/2018 11:33    Ref. Range 08/31/2015 12:32 04/02/2016 10:33 11/21/2016 09:53 01/22/2018 10:28  DLCO cor Latest Units: ml/min/mmHg  23.26 20.49 23.33 21.27  DLCO cor % pred Latest Units: % 75 66 75 68    IMPRESSION: 1. Spectrum of findings in the lungs remains compatible with interstitial lung disease, and appears stable compared to the prior examinations, again favored to represent nonspecific interstitial pneumonia (NSIP); technically classified as ATS indeterminate for usual interstitial pneumonia (UIP). 2. Aortic atherosclerosis, in addition to left main and 3 vessel coronary artery disease. Status post median sternotomy for CABG. 3. There are calcifications of the aortic valve, mitral annulus and mitral subvalvular apparatus. Echocardiographic correlation for evaluation of potential valvular dysfunction may be warranted if clinically indicated.  Aortic Atherosclerosis (ICD10-I70.0).   Electronically Signed   By: Vinnie Langton M.D.   On: 01/03/2018 10:45  ROS - per HPI     has a past medical history of Adenomatous colon polyp, Aortic stenosis, CAD (coronary artery disease), Carotid artery stenosis, Chronic diastolic CHF (congestive heart failure) ([place]), CKD (chronic kidney disease), stage III ([place]), Current use of long term anticoagulation, Diverticulosis, Dyslipidemia, Essential hypertension, H/O mitral valve repair, Hemorrhoids, Hyperlipidemia, Hypertension, Permanent atrial fibrillation, and TIA (transient ischemic attack).   reports that he quit smoking about 62 years ago. His smoking use included cigarettes. He has a 0.20 pack-year smoking history. He has never used smokeless tobacco.  Past Surgical History:  Procedure Laterality Date  . CORONARY ARTERY BYPASS GRAFT    . MITRAL VALVE REPAIR      Allergies  Allergen Reactions  . Asa [Aspirin] Other (See Comments)    Bleeding- has internal hemorrhoids  . Flomax [Tamsulosin] Other (See Comments)    Made his heart go out of rhythm    Immunization History  Administered Date(s) Administered  . Influenza Split 02/09/2015  . Influenza,  High Dose Seasonal PF 12/18/2015  . Pneumococcal Conjugate-13 03/20/2015    Family History  Problem Relation Age of Onset  . Stroke Mother        Deceased  . Heart disease Father        Deceased  . Heart failure Brother        Deceased  . Stroke Brother      Current Outpatient Medications:  .  amoxicillin (AMOXIL) 500 MG capsule, Take 2,000 mg by mouth See admin instructions. Take 2,000 mg by mouth one hour prior to dental visits every 6 months, Disp: , Rfl:  .  Cholecalciferol (VITAMIN D-3) 1000 units CAPS, Take 3,000 Units by mouth daily., Disp: ,  Rfl:  .  furosemide (LASIX) 20 MG tablet, Take 1 tablet (20 mg total) by mouth daily as needed for fluid or edema (shortness of breath)., Disp: 30 tablet, Rfl: 0 .  hydrocortisone (ANUSOL-HC) 2.5 % rectal cream, Place 1 application rectally 2 (two) times daily., Disp: 30 g, Rfl: 1 .  levothyroxine (SYNTHROID, LEVOTHROID) 50 MCG tablet, Take 50 mcg by mouth at bedtime. , Disp: , Rfl:  .  metoprolol succinate (TOPROL-XL) 25 MG 24 hr tablet, Take 0.5 tablets (12.5 mg total) by mouth daily., Disp: 30 tablet, Rfl: 3 .  pravastatin (PRAVACHOL) 80 MG tablet, Take 1 tablet (80 mg total) by mouth every evening., Disp: 90 tablet, Rfl: 3 .  warfarin (COUMADIN) 4 MG tablet, Take 4 mg by mouth every evening., Disp: , Rfl:       Objective:   Vitals:   01/22/18 1049  BP: 130/80  Weight: 188 lb (85.3 kg)  Height: _0  (1.727 m)    Estimated body mass index is 28.59 kg/m as calculated from the following:   Height as of this encounter: _1  (1.727 m).   Weight as of this encounter: 188 lb (85.3 kg).  _2 @  [employer]   01/22/18 1049  Weight: 188 lb (85.3 kg)     Physical Exam  General Appearance:    Alert, cooperative, no distress, appears stated age - ye , Deconditioned looking - no , OBESE  - no, Sitting on Wheelchair -  no  Head:    Normocephalic, without obvious abnormality, atraumatic  Eyes:    PERRL,  conjunctiva/corneas clear,  Ears:    Normal TM's and external ear canals, both ears  Nose:   Nares normal, septum midline, mucosa normal, no drainage    or sinus tenderness. OXYGEN ON  - no . Patient is @ ra   Throat:   Lips, mucosa, and tongue normal; teeth and gums normal. Cyanosis on lips - no  Neck:   Supple, symmetrical, trachea midline, no adenopathy;    thyroid:  no enlargement/tenderness/nodules; no carotid   bruit or JVD  Back:     Symmetric, no curvature, ROM normal, no CVA tenderness  Lungs:     Distress - no , Wheeze no, Barrell Chest - no, Purse lip breathing - no, Crackles -faint scattered crackles present  Chest Wall:    No tenderness or deformity.    Heart:    Regular rate and rhythm, S1 and S2 normal, no rub   or gallop, Murmur - no  Breast Exam:    NOT DONE  Abdomen:     Soft, non-tender, bowel sounds active all four quadrants,    no masses, no organomegaly. Visceral obesity - yes  Genitalia:   NOT DONE  Rectal:   NOT DONE  Extremities:   Extremities - normal, Has Cane - no, Clubbing - no, Edema - no  Pulses:   2+ and symmetric all extremities  Skin:   Stigmata of Connective Tissue Disease - no  Lymph nodes:   Cervical, supraclavicular, and axillary nodes normal  Psychiatric:  Neurologic:   Pleasant - yes, Anxious - no, Flat affect - yes  CAm-ICU - neg, Alert and Oriented x 3 - yes, Moves all 4s - yes, Speech - normal, Cognition - intact           Assessment:       ICD-10-CM   1. IPF (idiopathic pulmonary fibrosis) ([place]) J84.112   2. ILD (interstitial lung disease) ([place]) J84.9  Allowed to call his IPF even though he is indeterminate high-resolution CT.  This is because of possible progression and also mainly because of negative serology and age greater than 19 and male gender.  The best choice of a therapeutic agent would be nintedanib especially given new study showing broad spectrum of activity against all fibrotic varieties of interstitial lung disease.   However he is on Coumadin and given the theoretical bleeding risk and coronary artery disease risk with nintedanib I think pirfenidone would be a better choice.  Therefore we will put him on that.  He is agreeable to the plan.  We discussed the drug extensively.  He might try to get it at the [place].    Plan:     Patient Instructions     ICD-10-CM   1. IPF (idiopathic pulmonary fibrosis) ([place]) J84.112   2. ILD (interstitial lung disease) ([facility]) J84.9    There is likely progression of interstitial lung disease based on pulmonary function testing although on CT scan of the chest they calling it a stability.  I think a specific variety of interstitial lung disease you have is IPF  PLAN  -Nintedanib might be a good choice for you given the fact that there is new research showing that it can function across different varieties of interstitial lung disease  -However, it is second line agent in patients on blood thinners therefore we will start you on pirfenidone  -You are to take 3 pills 3 times daily with food  -You are to apply sunscreen  = Year to go through paperwork for this  -You can use [place] or through the [place] are to monitor for GI side effects  -Encouraged the pharmaceutical company support group - -You need monthly liver function tests for 6 months and then every 3 months thereafter  -This is a preventative drug to prevent his scar tissue from getting worse   Followup - 6 weels with APP to ensure tolerance to esbriet is good  - 12 weeks with DR Chase Caller in ILD clinic   - can consider bronchoscopy with BAL at followup to help narrow differential diagnosis    > 50% of this > 25 min visit spent in face to face counseling or coordination of care - by this undersigned MD - Dr Brand Males. This includes one or more of the following documented above: discussion of test results, diagnostic or treatment recommendations, prognosis, risks and benefits of management  options, instructions, education, compliance or risk-factor reduction   SIGNATURE    Dr. Brand Males, M.D., F.C.C.P,  Pulmonary and Critical Care Medicine Staff Physician, [place] Director - Interstitial Lung Disease  Program  Pulmonary [place] at [place], [place], [address]  Pager: [phone], If no answer or between  15:00h - 7:00h: call [phone] Telephone: [phone]  11:55 AM 01/22/2018

## 2018-01-22 NOTE — Progress Notes (Signed)
Patient completed pre spiro and dlco today. 

## 2018-01-22 NOTE — Patient Instructions (Signed)
ICD-10-CM   1. IPF (idiopathic pulmonary fibrosis) ([place]) J84.112   2. ILD (interstitial lung disease) ([facility]) J84.9    There is likely progression of interstitial lung disease based on pulmonary function testing although on CT scan of the chest they calling it a stability.  I think a specific variety of interstitial lung disease you have is IPF  PLAN  -Nintedanib might be a good choice for you given the fact that there is new research showing that it can function across different varieties of interstitial lung disease  -However, it is second line agent in patients on blood thinners therefore we will start you on pirfenidone  -You are to take 3 pills 3 times daily with food  -You are to apply sunscreen  = Year to go through paperwork for this  -You can use [place] or through the [place] are to monitor for GI side effects  -Encouraged the pharmaceutical company support group - -You need monthly liver function tests for 6 months and then every 3 months thereafter  -This is a preventative drug to prevent his scar tissue from getting worse   Followup - 6 weels with APP to ensure tolerance to esbriet is good  - 12 weeks with DR Chase Caller in ILD clinic   - can consider bronchoscopy with BAL at followup to help narrow differential diagnosis

## 2018-01-23 ENCOUNTER — Telehealth: Payer: Self-pay | Admitting: Internal Medicine

## 2018-01-23 ENCOUNTER — Encounter: Payer: Self-pay | Admitting: Cardiology

## 2018-01-23 NOTE — Progress Notes (Signed)
Results discussed with pt in OV with MR on 01/22/18. Pt started on esbriet.   Wyn Quaker FNP

## 2018-01-23 NOTE — Telephone Encounter (Signed)
I have faxed off the Esbriet to  [place] to [phone]. I will put this in MR folder and send to [facility] for follow up.

## 2018-01-29 ENCOUNTER — Telehealth: Payer: Self-pay | Admitting: Internal Medicine

## 2018-01-29 NOTE — Telephone Encounter (Signed)
Medication name and strength: Esbriet 267mg  tabs Provider: MR Pharmacy: [facility] Patient insurance ID: [id number] Phone:  Fax: [phone]  Was the PA started on CMM?  yes If yes, please enter the Key: [id number] PA Case ID: [id number]  Timeframe for approval/denial: takes around 3-5 business days  Routing to [place] to f/u with Patient.

## 2018-01-30 NOTE — Telephone Encounter (Signed)
Called pt and spoke with pt's wife [name] letting her know that a PA had been started for the Esbriet medication and stated to  Her once we knew the status of the PA we would be in touch with her and pt.  Margaret expressed understanding. Nothing further needed.

## 2018-01-30 NOTE — Telephone Encounter (Signed)
Pt is calling about Esbriet [phone]

## 2018-02-03 NOTE — Telephone Encounter (Signed)
Left message for patient's spouse to call back.  

## 2018-02-03 NOTE — Telephone Encounter (Signed)
Patient's wife states was notified that Jacklynn Bue was approved.  Wants to know what the next step is.  CB is [phone]

## 2018-02-03 NOTE — Telephone Encounter (Signed)
Patient spouse returned phone call

## 2018-02-03 NOTE — Telephone Encounter (Signed)
Spoke with patient's wife [name]. Advised her that the specialty pharmacy will then be in contact with them to get the medication scheduled for shipment. She verbalized understanding. Nothing further needed at time of call.

## 2018-02-04 NOTE — Telephone Encounter (Signed)
  CMM- Approved on November 13  PA Case: [id number], Status: Approved, Coverage Starts on: 01/29/2018 12:00:00 AM, Coverage Ends on: 01/29/2019 12:00:00 AM.  DrugEsbriet 267MG  tablets  Form[employer]visionRx Medicare 4-Part NCPDP Electronic PA Form Called and left message for Patient [place] PA approval.   Per 02/03/18 Telephone note- Patient has been notified of approval  Will route to [place] for [employer]

## 2018-02-14 ENCOUNTER — Emergency Department (HOSPITAL_COMMUNITY): Payer: PPO

## 2018-02-14 ENCOUNTER — Encounter (HOSPITAL_COMMUNITY): Payer: Self-pay | Admitting: Emergency Medicine

## 2018-02-14 ENCOUNTER — Emergency Department (HOSPITAL_COMMUNITY)
Admission: EM | Admit: 2018-02-14 | Discharge: 2018-02-14 | Disposition: A | Discharge status: Home / Self Care | Payer: PPO | Attending: Emergency Medicine | Admitting: Emergency Medicine

## 2018-02-14 ENCOUNTER — Other Ambulatory Visit: Payer: Self-pay

## 2018-02-14 DIAGNOSIS — Z87891 Personal history of nicotine dependence: Secondary | ICD-10-CM | POA: Insufficient documentation

## 2018-02-14 DIAGNOSIS — I110 Hypertensive heart disease with heart failure: Secondary | ICD-10-CM | POA: Diagnosis not present

## 2018-02-14 DIAGNOSIS — Z8673 Personal history of transient ischemic attack (TIA), and cerebral infarction without residual deficits: Secondary | ICD-10-CM | POA: Diagnosis not present

## 2018-02-14 DIAGNOSIS — N183 Chronic kidney disease, stage 3 (moderate): Secondary | ICD-10-CM | POA: Insufficient documentation

## 2018-02-14 DIAGNOSIS — I130 Hypertensive heart and chronic kidney disease with heart failure and stage 1 through stage 4 chronic kidney disease, or unspecified chronic kidney disease: Secondary | ICD-10-CM | POA: Diagnosis not present

## 2018-02-14 DIAGNOSIS — E785 Hyperlipidemia, unspecified: Secondary | ICD-10-CM | POA: Insufficient documentation

## 2018-02-14 DIAGNOSIS — Z7901 Long term (current) use of anticoagulants: Secondary | ICD-10-CM | POA: Diagnosis not present

## 2018-02-14 DIAGNOSIS — I2510 Atherosclerotic heart disease of native coronary artery without angina pectoris: Secondary | ICD-10-CM | POA: Insufficient documentation

## 2018-02-14 DIAGNOSIS — Z79899 Other long term (current) drug therapy: Secondary | ICD-10-CM | POA: Diagnosis not present

## 2018-02-14 DIAGNOSIS — I10 Essential (primary) hypertension: Secondary | ICD-10-CM | POA: Diagnosis not present

## 2018-02-14 DIAGNOSIS — I509 Heart failure, unspecified: Secondary | ICD-10-CM

## 2018-02-14 DIAGNOSIS — R531 Weakness: Secondary | ICD-10-CM | POA: Diagnosis not present

## 2018-02-14 DIAGNOSIS — I5033 Acute on chronic diastolic (congestive) heart failure: Secondary | ICD-10-CM | POA: Diagnosis not present

## 2018-02-14 DIAGNOSIS — R000 Tachycardia, unspecified: Secondary | ICD-10-CM | POA: Diagnosis not present

## 2018-02-14 DIAGNOSIS — I4821 Permanent atrial fibrillation: Secondary | ICD-10-CM | POA: Insufficient documentation

## 2018-02-14 DIAGNOSIS — R0602 Shortness of breath: Secondary | ICD-10-CM | POA: Diagnosis not present

## 2018-02-14 DIAGNOSIS — I4891 Unspecified atrial fibrillation: Secondary | ICD-10-CM | POA: Diagnosis not present

## 2018-02-14 DIAGNOSIS — R0902 Hypoxemia: Secondary | ICD-10-CM | POA: Diagnosis not present

## 2018-02-14 LAB — URINALYSIS, ROUTINE W REFLEX MICROSCOPIC
Bilirubin Urine: NEGATIVE
GLUCOSE, UA: NEGATIVE mg/dL
HGB URINE DIPSTICK: NEGATIVE
Ketones, ur: NEGATIVE mg/dL
Leukocytes, UA: NEGATIVE
Nitrite: NEGATIVE
Protein, ur: NEGATIVE mg/dL
Specific Gravity, Urine: 1.01 (ref 1.005–1.030)
pH: 5 (ref 5.0–8.0)

## 2018-02-14 LAB — COMPREHENSIVE METABOLIC PANEL
ALBUMIN: 4.4 g/dL (ref 3.5–5.0)
ALT: 28 U/L (ref 0–44)
AST: 31 U/L (ref 15–41)
Alkaline Phosphatase: 58 U/L (ref 38–126)
Anion gap: 11 (ref 5–15)
BUN: 35 mg/dL — ABNORMAL HIGH (ref 8–23)
CHLORIDE: 107 mmol/L (ref 98–111)
CO2: 23 mmol/L (ref 22–32)
Calcium: 9.4 mg/dL (ref 8.9–10.3)
Creatinine, Ser: 2.03 mg/dL — ABNORMAL HIGH (ref 0.61–1.24)
GFR calc non Af Amer: 29 mL/min — ABNORMAL LOW (ref >60)
GFR, EST AFRICAN AMERICAN: 34 mL/min — AB (ref >60)
Glucose, Bld: 92 mg/dL (ref 70–99)
Potassium: 4.2 mmol/L (ref 3.5–5.1)
SODIUM: 141 mmol/L (ref 135–145)
Total Bilirubin: 1.4 mg/dL — ABNORMAL HIGH (ref 0.3–1.2)
Total Protein: 7.5 g/dL (ref 6.5–8.1)

## 2018-02-14 LAB — CBC WITH DIFFERENTIAL/PLATELET
Abs Immature Granulocytes: 0.01 10*3/uL (ref 0.00–0.07)
Basophils Absolute: 0 10*3/uL (ref 0.0–0.1)
Basophils Relative: 1 %
Eosinophils Absolute: 0.1 10*3/uL (ref 0.0–0.5)
Eosinophils Relative: 1 %
HCT: 44.7 % (ref 39.0–52.0)
Hemoglobin: 13.7 g/dL (ref 13.0–17.0)
Immature Granulocytes: 0 %
LYMPHS PCT: 16 %
Lymphs Abs: 1 10*3/uL (ref 0.7–4.0)
MCH: 28.8 pg (ref 26.0–34.0)
MCHC: 30.6 g/dL (ref 30.0–36.0)
MCV: 94.1 fL (ref 80.0–100.0)
MONO ABS: 0.6 10*3/uL (ref 0.1–1.0)
Monocytes Relative: 10 %
Neutro Abs: 4.7 10*3/uL (ref 1.7–7.7)
Neutrophils Relative %: 72 %
Platelets: 112 10*3/uL — ABNORMAL LOW (ref 150–400)
RBC: 4.75 MIL/uL (ref 4.22–5.81)
RDW: 15.6 % — ABNORMAL HIGH (ref 11.5–15.5)
WBC: 6.6 10*3/uL (ref 4.0–10.5)
nRBC: 0 % (ref 0.0–0.2)

## 2018-02-14 LAB — BRAIN NATRIURETIC PEPTIDE: B Natriuretic Peptide: 1407 pg/mL — ABNORMAL HIGH (ref 0.0–100.0)

## 2018-02-14 LAB — PROTIME-INR
INR: 3.23
Prothrombin Time: 32.5 seconds — ABNORMAL HIGH (ref 11.4–15.2)

## 2018-02-14 LAB — I-STAT TROPONIN, ED: Troponin i, poc: 0 ng/mL (ref 0.00–0.08)

## 2018-02-14 MED ORDER — FUROSEMIDE 10 MG/ML IJ SOLN
80.0000 mg | Freq: Once | INTRAMUSCULAR | Status: AC
  Administered 2018-02-14: 80 mg via INTRAVENOUS
  Filled 2018-02-14: qty 8

## 2018-02-14 NOTE — Discharge Instructions (Addendum)
Your kidney function is slightly worse than it has been in the past.  This needs to be followed up by your physician.  Make sure that you limit your salt intake.  Follow-up with your cardiologist as scheduled on Friday or sooner if your symptoms are not improving.  Return here to the emergency department if you have any worsening shortness of breath or other worsening symptoms.

## 2018-02-14 NOTE — ED Provider Notes (Signed)
[facility] EMERGENCY DEPARTMENT Provider Note   CSN: [id number] Arrival date & time: 02/14/18  1743     History   Chief Complaint Chief Complaint  Patient presents with  . Shortness of Breath  . Weakness    HPI Devlyn Retter is a 84 y.o. male.  Patient is a 84 year old male with a history of atrial fibrillation, on Coumadin, interstitial lung disease, chronic kidney disease, hypertension, hyperlipidemia, CHF who presents with weakness and shortness of breath.  He states he has had some worsening weakness and shortness of breath over the last 2 weeks but he felt much weaker today.  He reports a cough which is mostly nonproductive.  No nasal congestion.  No fevers.  No chest pain or tightness.  No abdominal pain.  No nausea or vomiting or diarrhea.  No urinary symptoms.  He has had a little bit of increased swelling to his lower extremities bilaterally.  He typically has more swelling in his left leg than his right leg which is true today.  He had a 1 pound weight gain today and took an extra dose of Lasix.     Past Medical History:  Diagnosis Date  . Adenomatous colon polyp   . Aortic stenosis    a. mild by echo 2017.  Marland Kitchen CAD (coronary artery disease)    a. S/P CABG x 1 @ time of MV annuloplasty;  b. 02/2010 ETT: neg for ischemia.  . Carotid artery stenosis    1-39% by dopplers 10/2016  . Chronic diastolic CHF (congestive heart failure) ([place])   . CKD (chronic kidney disease), stage III ([place])   . Current use of long term anticoagulation    a. Coumadin in setting of afib.  . Diverticulosis    Colonoscopy  . Dyslipidemia   . Essential hypertension   . H/O mitral valve repair   . Hemorrhoids   . Hyperlipidemia   . Hypertension   . Permanent atrial fibrillation    a. CHA2DS2VASc = 6 -->chronic coumadin;  b. Prev on amio-->d/c 2/2 hypothyroidism. >> resumed 5/16. c. Notes from 2017 indicate interstitial lung disease by pulm appt, question amio toxicity, also  increased liver attenuation. Rate control pursued and amiodarone discontinued.  Marland Kitchen TIA (transient ischemic attack)     Patient Active Problem List   Diagnosis Date Noted  . Dyspnea on exertion and orthopnea 12/24/2017  . Acute CHF (congestive heart failure) ([place]) 12/24/2017  . Acute on chronic diastolic CHF (congestive heart failure) ([place]) 12/24/2017  . CHF (congestive heart failure) ([place]) 12/24/2017  . ILD (interstitial lung disease) ([place]) 05/25/2016  . External hemorrhoids 08/02/2015  . Aortic stenosis, mild 07/08/2015  . Long term (current) use of anticoagulants [Z79.01] 07/08/2015  . Carotid artery stenosis 11/22/2014  . Rectal bleeding 04/13/2014  . Chronic anticoagulation 04/13/2014  . Chronic kidney disease (CKD), stage III (moderate) ([place]) 03/02/2014  . CAD (coronary artery disease), native coronary artery 03/02/2014  . H/O mitral valve repair 02/07/2014  . Permanent atrial fibrillation 02/07/2014  . Chronic diastolic heart failure ([place]) 02/07/2014  . Essential hypertension 02/07/2014  . Dyslipidemia 02/07/2014  . Current use of long term anticoagulation 02/07/2014  . Generalized weakness 02/07/2014    Past Surgical History:  Procedure Laterality Date  . CORONARY ARTERY BYPASS GRAFT    . MITRAL VALVE REPAIR          Home Medications    Prior to Admission medications   Medication Sig Start Date End Date Taking? Authorizing Provider  amoxicillin (AMOXIL) 500 MG capsule Take 2,000 mg by mouth See admin instructions. Take 2,000 mg by mouth one hour prior to dental visits every 6 months 12/17/17  Yes [provider]  Cholecalciferol (VITAMIN D-3) 1000 units CAPS Take 3,000 Units by mouth daily.   Yes [provider]  ESBRIET 267 MG TABS Take 267 mg by mouth 3 (three) times daily. 02/03/18  Yes [provider]  furosemide (LASIX) 20 MG tablet Take 1 tablet (20 mg total) by mouth daily as needed for fluid or edema (shortness of breath). 12/27/17   Yes Rai, Ripudeep K, MD  levothyroxine (SYNTHROID, LEVOTHROID) 50 MCG tablet Take 50 mcg by mouth at bedtime.    Yes [provider]  metoprolol succinate (TOPROL-XL) 25 MG 24 hr tablet Take 0.5 tablets (12.5 mg total) by mouth daily. 12/28/17  Yes Rai, Ripudeep K, MD  pravastatin (PRAVACHOL) 80 MG tablet Take 1 tablet (80 mg total) by mouth every evening. Patient taking differently: Take 80 mg by mouth every Monday, Wednesday, and Friday.  02/28/17 02/28/18 Yes Turner, Eber Hong, MD  warfarin (COUMADIN) 4 MG tablet Take 4 mg by mouth every evening.   Yes [provider]  hydrocortisone (ANUSOL-HC) 2.5 % rectal cream Place 1 application rectally 2 (two) times daily. Patient not taking: Reported on 02/14/2018 08/02/15   Zehr, Laban Emperor, PA-C    Family History Family History  Problem Relation Age of Onset  . Stroke Mother        Deceased  . Heart disease Father        Deceased  . Heart failure Brother        Deceased  . Stroke Brother     Social History Social History   Tobacco Use  . Smoking status: Former Smoker    Packs/day: 0.10    Years: 2.00    Pack years: 0.20    Types: Cigarettes    Last attempt to quit: 03/20/1955    Years since quitting: 62.9  . Smokeless tobacco: Never Used  Substance Use Topics  . Alcohol use: No    Alcohol/week: 0.0 standard drinks  . Drug use: No     Allergies   Asa [aspirin] and Flomax [tamsulosin]   Review of Systems Review of Systems  Constitutional: Positive for fever. Negative for chills, diaphoresis and fatigue.  HENT: Negative for congestion, rhinorrhea and sneezing.   Eyes: Negative.   Respiratory: Positive for cough and shortness of breath. Negative for chest tightness.   Cardiovascular: Negative for chest pain and leg swelling.  Gastrointestinal: Negative for abdominal pain, blood in stool, diarrhea, nausea and vomiting.  Genitourinary: Negative for difficulty urinating, flank pain, frequency and hematuria.    Musculoskeletal: Negative for arthralgias and back pain.  Skin: Negative for rash.  Neurological: Positive for weakness (Generalized). Negative for dizziness, speech difficulty, numbness and headaches.     Physical Exam Updated Vital Signs BP 124/79 (BP Location: Right Arm)   Pulse (!) 113   Temp 97.6 F (36.4 C) (Oral)   Resp 18   Ht 5\' 8"  (1.727 m)   Wt 86.2 kg   SpO2 100%   BMI 28.89 kg/m   Physical Exam  Constitutional: He is oriented to person, place, and time. He appears well-developed and well-nourished.  HENT:  Head: Normocephalic and atraumatic.  Eyes: Pupils are equal, round, and reactive to light.  Neck: Normal range of motion. Neck supple.  Cardiovascular: Normal rate, regular rhythm and normal heart sounds.  Pulmonary/Chest: Effort  normal and breath sounds normal. No respiratory distress. He has no wheezes. He has no rales. He exhibits no tenderness.  Abdominal: Soft. Bowel sounds are normal. There is no tenderness. There is no rebound and no guarding.  Musculoskeletal: Normal range of motion.       Right lower leg: He exhibits edema.       Left lower leg: He exhibits edema.  Lymphadenopathy:    He has no cervical adenopathy.  Neurological: He is alert and oriented to person, place, and time.  Skin: Skin is warm and dry. No rash noted.  Psychiatric: He has a normal mood and affect.     ED Treatments / Results  Labs (all labs ordered are listed, but only abnormal results are displayed) Labs Reviewed  COMPREHENSIVE METABOLIC PANEL - Abnormal; Notable for the following components:      Result Value   BUN 35 (*)    Creatinine, Ser 2.03 (*)    Total Bilirubin 1.4 (*)    GFR calc non Af Amer 29 (*)    GFR calc Af Amer 34 (*)    All other components within normal limits  BRAIN NATRIURETIC PEPTIDE - Abnormal; Notable for the following components:   B Natriuretic Peptide 1,407.0 (*)    All other components within normal limits  CBC WITH  DIFFERENTIAL/PLATELET - Abnormal; Notable for the following components:   RDW 15.6 (*)    Platelets 112 (*)    All other components within normal limits  PROTIME-INR - Abnormal; Notable for the following components:   Prothrombin Time 32.5 (*)    All other components within normal limits  URINALYSIS, ROUTINE W REFLEX MICROSCOPIC  I-STAT TROPONIN, ED    EKG EKG Interpretation  Date/Time:  Friday February 14 2018 17:46:53 EST Ventricular Rate:  108 PR Interval:    QRS Duration: 121 QT Interval:  392 QTC Calculation: 526 R Axis:   97 Text Interpretation:  Atrial fibrillation Nonspecific intraventricular conduction delay since last tracing no significant change Confirmed by Malvin Johns [phone]) on 02/14/2018 5:54:32 PM   Radiology Dg Chest 2 View  Result Date: 02/14/2018 CLINICAL DATA:  Generalized weakness with shortness of breath. EXAM: CHEST - 2 VIEW COMPARISON:  01/12/2018 FINDINGS: The heart size is enlarged. Previous median sternotomy CABG procedure. Small bilateral pleural effusions and interstitial edema identified. No airspace opacities. Visualized osseous structures are unremarkable. IMPRESSION: 1. Mild congestive heart failure. Electronically Signed   By: Kerby Moors M.D.   On: 02/14/2018 18:45    Procedures Procedures (including critical care time)  Medications Ordered in ED Medications  furosemide (LASIX) injection 80 mg (80 mg Intravenous Given 02/14/18 1935)     Initial Impression / Assessment and Plan / ED Course  I have reviewed the triage vital signs and the nursing notes.  Pertinent labs & imaging results that were available during my care of the patient were reviewed by me and considered in my medical decision making (see chart for details).     Patient is a 84 year old male with a history of CHF who presents with shortness of breath and generalized weakness.  He has suggestions of fluid overload.  He has some mild pulmonary edema on chest x-ray.   His BNP is markedly elevated.  His creatinine is just minimally elevated as compared to his last values about a month ago but they have been creeping up over the last few months.  He was given a dose of Lasix in the ED and has diuresed and  is feeling much better.  He no longer has shortness of breath.  He is able to ambulate without hypoxia or shortness of breath.  There is no ischemic changes on EKG.  His troponin is negative.  He does not report any chest pain.  He was discharged home in good condition.  He was encouraged to have close follow-up with his cardiologist.  He was advised that his creatinine is slowly climbing needs to be followed by his PCP.  He was given strict return precautions.  His heart rate is remaining mostly in the 90s.  He is in A. fib and occasionally it will go up into the low 100s.  He states that this is typical for him.  Final Clinical Impressions(s) / ED Diagnoses   Final diagnoses:  Acute on chronic congestive heart failure, unspecified heart failure type [facility])    ED Discharge Orders    None       Malvin Johns, MD 02/14/18 2135

## 2018-02-14 NOTE — ED Notes (Signed)
Pt's oxygen stayed above 95% while ambulating. Pt states that he is feeling much better than when he first came in. Pt says that he is ready to go home.

## 2018-02-14 NOTE — ED Triage Notes (Signed)
Pt to ED with c/o generalized weakness and shortness of breath.  Also st's he has been coughing

## 2018-02-20 ENCOUNTER — Encounter: Payer: Self-pay | Admitting: Cardiology

## 2018-02-20 ENCOUNTER — Ambulatory Visit (INDEPENDENT_AMBULATORY_CARE_PROVIDER_SITE_OTHER): Payer: PPO | Admitting: Cardiology

## 2018-02-20 VITALS — BP 122/78 | HR 105 | Ht 68.0 in | Wt 187.0 lb

## 2018-02-20 DIAGNOSIS — Z7189 Other specified counseling: Secondary | ICD-10-CM

## 2018-02-20 DIAGNOSIS — Z7901 Long term (current) use of anticoagulants: Secondary | ICD-10-CM

## 2018-02-20 DIAGNOSIS — I10 Essential (primary) hypertension: Secondary | ICD-10-CM | POA: Diagnosis not present

## 2018-02-20 DIAGNOSIS — I4891 Unspecified atrial fibrillation: Secondary | ICD-10-CM

## 2018-02-20 DIAGNOSIS — I5032 Chronic diastolic (congestive) heart failure: Secondary | ICD-10-CM | POA: Diagnosis not present

## 2018-02-20 DIAGNOSIS — I4821 Permanent atrial fibrillation: Secondary | ICD-10-CM | POA: Diagnosis not present

## 2018-02-20 DIAGNOSIS — Z79899 Other long term (current) drug therapy: Secondary | ICD-10-CM | POA: Diagnosis not present

## 2018-02-20 DIAGNOSIS — I2510 Atherosclerotic heart disease of native coronary artery without angina pectoris: Secondary | ICD-10-CM | POA: Diagnosis not present

## 2018-02-20 LAB — BASIC METABOLIC PANEL
BUN/Creatinine Ratio: 17 (ref 10–24)
BUN: 28 mg/dL — ABNORMAL HIGH (ref 8–27)
CALCIUM: 9.5 mg/dL (ref 8.6–10.2)
CO2: 21 mmol/L (ref 20–29)
Chloride: 103 mmol/L (ref 96–106)
Creatinine, Ser: 1.66 mg/dL — ABNORMAL HIGH (ref 0.76–1.27)
GFR calc Af Amer: 43 mL/min/{1.73_m2} — ABNORMAL LOW (ref >59)
GFR calc non Af Amer: 38 mL/min/{1.73_m2} — ABNORMAL LOW (ref >59)
Glucose: 89 mg/dL (ref 65–99)
Potassium: 4 mmol/L (ref 3.5–5.2)
Sodium: 142 mmol/L (ref 134–144)

## 2018-02-20 MED ORDER — FUROSEMIDE 20 MG PO TABS: ORAL_TABLET | ORAL | 3 refills | Status: DC

## 2018-02-20 NOTE — Patient Instructions (Addendum)
Medication Instructions:  "Dry" weight is 182 lbs If your weight is 185 or greater:  -Take the regular lasix 20 mg in the morning  -Take an extra 20 mg at noon If the weight the next day is still 185 lbs or greater, repeat. If you have to take extra lasix for 3 or more days, call the office   Take- whole pill (25 mg) metoprolol daily   -call if you are dizzy   -Goal heart rate < 100 and top blood pressure > 100.  If you need a refill on your cardiac medications before your next appointment, please call your pharmacy.   Lab work: Your physician recommends that you return for lab work today (BMP)  If you have labs (blood work) drawn today and your tests are completely normal, you will receive your results only by: Marland Kitchen MyChart Message (if you have MyChart) OR . A paper copy in the mail If you have any lab test that is abnormal or we need to change your treatment, we will call you to review the results.  Testing/Procedures: None  Follow-Up: At [facility], you and your health needs are our priority.  As part of our continuing mission to provide you with exceptional heart care, we have created designated Provider Care Teams.  These Care Teams include your primary Cardiologist (physician) and Advanced Practice Providers (APPs -  Physician Assistants and Nurse Practitioners) who all work together to provide you with the care you need, when you need it. You will need a follow up appointment in 6 weeks.  Please call our office 2 months in advance to schedule this appointment.  You may see Buford Dresser, MD or one of the following Advanced Practice Providers on your designated Care Team:   Rosaria Ferries, PA-C . Jory Sims, DNP, ANP  Any Other Special Instructions Will Be Listed Below (If Applicable).

## 2018-02-20 NOTE — Progress Notes (Signed)
Cardiology Office Note:    Date:  02/20/2018   ID:  [name], DOB 06-Oct-1931, MRN [id number]  PCP:  [name], MD  Cardiologist:  Buford Dresser, MD PhD  Referring MD: [name], MD   CC: follow up  History of Present Illness:    [name] is a 84 y.o. male with a hx of CAD s/p CABG, mitral valve repair, atrial fibrillation on anticoagulation with coumadin (managed by [facility]), interstitial lung disease, chronic diastolic heart failure who is seen in follow up today for the evaluation and management of systolic and diastolic heart failure, atrial fibrillation. He follows at the [facility] in [place], and he receives his medication and warfarin monitoring there.    I met [name] during his recent hospitalization. He was discharged on 12/27/17 after hospitalization for acute on chronic diastolic heart failure and atrial fibrillation with rapid ventricular response. During his hospitalization, he had a new drop in his ejection fraction, consistent with new acute systolic heart failure in addition to his diastolic heart failure. He was diuresed >5 L, with weight change from 190 lbs to 179 lbs at discharge. His hypotension had limited rate control agents in the past, and he was not on a beta blocker at the time of admission. He was titrated onto a low dose of beta blocker, limited by symptoms. His blood pressure was too low to add ACEI/ARB.   His hospital follow up on 01/22/18 was notable for stable weights, but his blood pressures and heart rates were not ideal. is blood pressure averages 100-110/60s, but the lowest readings were 80/60 (HR 109) and 92/42 (HR 100). His heart rates average around 100 as well, with the lowest at 70 (BP 95/60) and highest at 125 (BP 109/75).  Updates today: He was seen in the ER for shortness of breath on 02/14/18 (the day after Thanksgiving).He was feeling weak and fatigued at that time as well. On review of his home weights, his baseline  weight is around 182, and he was up to 190 on 11/29. He received a dose of IV lasix and felt much better. The following morning, his home weight was back to 182. He is now taking lasix daily instead of PRN, and his weights have remained stable. He has felt "great" the last two days. He doesn't have a log of BP and HR with him, but per his wife, his HR has consistently been low 100s to 110s. Blood pressures have also been around 625 systolic. He is not sure if he is taking 1/2 dose of metoprolol or full pill. He denies any syncope, lightheadedness, chest pain. Breathing is stable. His wife is concerned as he seems to have moments of apnea when he sleeps. She states that he will take 8 regular breaths and then not breath again until the count of 20. She does not think he has ever had a sleep study.   Past Medical History:  Diagnosis Date  . Adenomatous colon polyp   . Aortic stenosis    a. mild by echo 2017.  Marland Kitchen CAD (coronary artery disease)    a. S/P CABG x 1 @ time of MV annuloplasty;  b. 02/2010 ETT: neg for ischemia.  . Carotid artery stenosis    1-39% by dopplers 10/2016  . Chronic diastolic CHF (congestive heart failure) ([place])   . CKD (chronic kidney disease), stage III ([place])   . Current use of long term anticoagulation    a. Coumadin in setting of afib.  Marland Kitchen  Diverticulosis    Colonoscopy  . Dyslipidemia   . Essential hypertension   . H/O mitral valve repair   . Hemorrhoids   . Hyperlipidemia   . Hypertension   . Permanent atrial fibrillation    a. CHA2DS2VASc = 6 -->chronic coumadin;  b. Prev on amio-->d/c 2/2 hypothyroidism. >> resumed 5/16. c. Notes from 2017 indicate interstitial lung disease by pulm appt, question amio toxicity, also increased liver attenuation. Rate control pursued and amiodarone discontinued.  Marland Kitchen TIA (transient ischemic attack)     Past Surgical History:  Procedure Laterality Date  . CORONARY ARTERY BYPASS GRAFT    . MITRAL VALVE REPAIR      Current  Medications: Current Outpatient Medications on File Prior to Visit  Medication Sig  . amoxicillin (AMOXIL) 500 MG tablet 500 mg. Take 2000 mg by mouth one hour prior to dental visits  . Cholecalciferol (VITAMIN D-3) 1000 units CAPS Take by mouth daily.   . ESBRIET 267 MG TABS Take 267 mg by mouth 3 (three) times daily.  . hydrocortisone (ANUSOL-HC) 2.5 % rectal cream Place 1 application rectally 2 (two) times daily.  Marland Kitchen levothyroxine (SYNTHROID, LEVOTHROID) 50 MCG tablet Take 50 mcg by mouth at bedtime.   . metoprolol succinate (TOPROL-XL) 25 MG 24 hr tablet Take 0.5 tablets (12.5 mg total) by mouth daily.  . pravastatin (PRAVACHOL) 80 MG tablet Take 1 tablet (80 mg total) by mouth every evening. (Patient taking differently: Take 80 mg by mouth every Monday, Wednesday, and Friday. )  . warfarin (COUMADIN) 4 MG tablet Take 4 mg by mouth every evening.   No current facility-administered medications on file prior to visit.      Allergies:   Asa [aspirin] and Flomax [tamsulosin]   Social History   Socioeconomic History  . Marital status: Married    Spouse name: Not on file  . Number of children: Not on file  . Years of education: Not on file  . Highest education level: Not on file  Occupational History  . 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 Financial resource strain: Not on file  . Food insecurity:    Worry: Not on file    Inability: Not on file  . Transportation needs:    Medical: Not on file    Non-medical: Not on file  Tobacco Use  . Smoking status: Former Smoker    Packs/day: 0.10    Years: 2.00    Pack years: 0.20    Types: Cigarettes    Last attempt to quit: 03/20/1955    Years since quitting: 62.9  . Smokeless tobacco: Never Used  Substance and Sexual Activity  . Alcohol use: No    Alcohol/week: 0.0 standard drinks  . Drug use: No  . Sexual activity: Not Currently  Lifestyle  . Physical activity:    Days per week: Not on file    Minutes  per session: Not on file  . Stress: Not on file  Relationships  . Social connections:    Talks on phone: Not on file    Gets together: Not on file    Attends religious service: Not on file    Active member of club or organization: Not on file    Attends meetings of clubs or organizations: Not on file    Relationship status: Not on file  Other Topics Concern  . Not on file  Social History Narrative  . Not on file  Family History: The patient's family history includes Heart disease in his father; Heart failure in his brother; Stroke in his brother and mother.  ROS:   Please see the history of present illness.  Additional pertinent ROS:  Constitutional: Negative for chills, fever, night sweats, unintentional weight loss  HENT: Negative for ear pain and hearing loss.   Eyes: Negative for loss of vision and eye pain.  Respiratory: Positive for dyspnea on exertion. Negative for cough, sputum, shortness of breath at rest, wheezing.   Cardiovascular: Negative for chest pain, palpitations, PND, orthopnea, and claudication. Trace LE edema. Gastrointestinal: Negative for abdominal pain, melena, and hematochezia.  Genitourinary: Negative for dysuria and hematuria.  Musculoskeletal: Negative for falls and myalgias.  Skin: Negative for itching and rash.  Neurological: Negative for focal weakness, focal sensory changes and loss of consciousness.  Endo/Heme/Allergies: Does bruise/bleed easily.    EKGs/Labs/Other Studies Reviewed:    The following studies were reviewed today:  Echo 12/25/17 Study Conclusions - Left ventricle: The cavity size was mildly dilated. Systolic function was severely reduced. The estimated ejection fraction was in the range of 20% to 25%. Severe diffuse hypokinesis. The study was not technically sufficient to allow evaluation of LV diastolic dysfunction due to atrial fibrillation. Doppler parameters are consistent with high ventricular filling  pressure. - Aortic valve: There was mild regurgitation. Valve area (VTI): 0.7 cm^2. Valve area (Vmax): 0.85 cm^2. Valve area (Vmean): 0.76 cm^2. - Mitral valve: S/P prior MV repair Moderate diffuse thickening and calcification of the anterior leaflet and posterior leaflet, with mild involvement of chords. Mobility of the posterior leaflet was restricted to the point of immobility. There was mild regurgitation. - Left atrium: The atrium was mildly dilated. - Right ventricle: Systolic function was mildly reduced. - Tricuspid valve: There was trivial regurgitation. - Pulmonary arteries: PA peak pressure: 48 mm Hg (S).  Impressions: - Mildly dilated LV with severe LV dysfunction. Cannot assess diastolic function due to underlying atrial fibrillation but there is increased LV filling pressures. S/P MV repair with mild MR and moderately thickened MV leaflets. The posterior leaflet appears fixed. The AV is poorly visualized but there is calcification of the valve. Cannot assess degree of AS given poor visualization and gradients may be falsely low in setting of LV dysfunction. Consider TEE for further evaluation.  EKG:  Personally reviewed. The ekg ordered today demonstrates atrial fibrillation with RVR, rate 105 bpm.  Recent Labs: 12/24/2017: Magnesium 2.0; NT-Pro BNP 10,355; TSH 3.558 02/14/2018: ALT 28; B Natriuretic Peptide 1,407.0; BUN 35; Creatinine, Ser 2.03; Hemoglobin 13.7; Platelets 112; Potassium 4.2; Sodium 141  Recent Lipid Panel    Component Value Date/Time   CHOL 193 06/17/2017 0744   TRIG 114 06/17/2017 0744   HDL 35 (L) 06/17/2017 0744   CHOLHDL 5.5 (H) 06/17/2017 0744   CHOLHDL 2.6 03/16/2016 0903   VLDL 20 03/16/2016 0903   LDLCALC 135 (H) 06/17/2017 0744    Physical Exam:    VS:  BP 122/78   Pulse (!) 105   Ht _0  (1.727 m)   Wt 187 lb (84.8 kg)   BMI 28.43 kg/m     Wt Readings from Last 3 Encounters:  02/20/18 187 lb (84.8  kg)  02/14/18 190 lb (86.2 kg)  01/22/18 188 lb (85.3 kg)     GEN: Well nourished, well developed in no acute distress HEENT: Normal NECK: JVD 8 cm; No carotid bruits LYMPHATICS: No lymphadenopathy CARDIAC: irregular rhythm, normal S1 and S2, no rubs, gallops.  2/6 harsh Early to mid peaking SEM with audible S2. Radial and DP pulses 2+ bilaterally. RESPIRATORY:  Largely clear bilaterally except for fine crackles throughout. No rales. ABDOMEN: Soft, non-tender, non-distended MUSCULOSKELETAL:  Trace LE edema; No deformity  SKIN: Warm and dry NEUROLOGIC:  Alert and oriented x 3 PSYCHIATRIC:  Normal affect   ASSESSMENT:    1. Atrial fibrillation with RVR ([place])   2. Medication management    PLAN:   Chronic diastolic CHF with recently diagnosed systolic heart failure:  -appears euvolemic today. Now taking daily lasix after recent shortness of breath/need for IV lasix in ER. Given extra weight based dosing giudelines -on metoprolol succinate -counseled on heart failure as below -working to optimize his heart rate prior to repeating echo. Ideally, would like HR below 100 while maintaining systolic BP >161 -his blood pressure cannot tolerate ACEI/ARB/ARNI/MRA at this point.  Do the following things EVERY DAY:  1) Weigh yourself EVERY morning after you go to the bathroom but before you eat or drink anything. Write this number down in a weight log/diary.   2) Take your medicines as prescribed. If you have concerns about your medications, please call us before you stop taking them.   3) Eat low salt foods-Limit salt (sodium) to 2000 mg per day. This will help prevent your body from holding onto fluid. Read food labels as many processed foods have a lot of sodium, especially canned goods and prepackaged meats. If you would like some assistance choosing low sodium foods, we would be happy to set you up with a nutritionist.  4) Stay as active as you can everyday. Staying active will give you  more energy and make your muscles stronger. Start with 5 minutes at a time and work your way up to 30 minutes a day. Break up your activities--do some in the morning and some in the afternoon. Start with 3 days per week and work your way up to 5 days as you can.  If you have chest pain, feel short of breath, dizzy, or lightheaded, STOP. If you don't feel better after a short rest, call 911. If you do feel better, call the office to let us know you have symptoms with exercise.  5) Limit all fluids for the day to less than 2 liters. Fluid includes all drinks, coffee, juice, ice chips, soup, jello, and all other liquids.  "Dry" weight is 182 lbs If your weight is 185 or greater:  -Take the regular lasix 20 mg in the morning  -Take an extra 20 mg at noon If the weight the next day is still 185 lbs or greater, repeat. If you have to take extra lasix for 3 or more days, call the office   Take- whole pill (25 mg) metoprolol daily   -call if you are dizzy   -Goal heart rate < 100 and top blood pressure > 100.  Atrial Fibrillation w/ RVR: Continues to be a challenge. Improved from hospitalization, when resting rates in the 120s and elevations >160 with exertion. However, it is a tight balance between heart rate control and blood pressure.  -he is not sure if he is taking full or 1/2 tab of 25 mg metoprolol regularly. I instructed him to try to take whole pill (25 mg) if not doing so already and see how he feels. We discussed rate/BP based dosing at the last visit. If he tolerates 25 mg, would increase to 37.5 mg at next visit. -not controlled on amiodarone in the  past, no diltiazem given low EF, declines digoxin after shared decision making.  -INR followed by the [facility]. We discussed mentioning change to DOAC at the last visit, he has not discussed with [facility] to see if this would be covered. He would be reduced dose apixaban if he decides to change.   CKD: given increased use of diuretics, checking BMP  today-->Cr improved from prior, K stable  CAD: remote h/o CABG. Denies CP.  Goal LDL <70, previously LDL 135 (not at goal). On pravastatin 80 mg. Would like to be on high intensity statin, but will make gradual medication changes. No aspirin given need for anticoagulation. No symptoms.   Reviewed briefly today: Mitral Regurgitation: h/o MV repair. Mild MR on echo. Aortic Stenosis: mild AS noted on prior echo in 2017. Unclear level of stenosis on most recent echo. Dyspnea on exertion: reviewed his chart. Followed by pulmonology as well. Concern for interstitial lung disease/ IPF, discussing treatment options with pulmonology.  Plan for follow up: 6 weeks  TIME SPENT WITH PATIENT: >25 minutes of direct patient care. More than 50% of that time was spent on coordination of care and counseling regarding atrial fibrillation with RVR, heart failure management.  Buford Dresser, MD, PhD [place]  [facility]   Medication Adjustments/Labs and Tests Ordered: Current medicines are reviewed at length with the patient today.  Concerns regarding medicines are outlined above.  Orders Placed This Encounter  Procedures  . Basic metabolic panel  . EKG 12-Lead   Meds ordered this encounter  Medications  . DISCONTD: furosemide (LASIX) 20 MG tablet    Sig: Take 1 table by mouth daily. If weight is greater than 185 take 1 extra tablet (20 mg) at noon. If you have to take extra tablet for 3 days or more, call office.    Dispense:  120 tablet    Refill:  3  . furosemide (LASIX) 20 MG tablet    Sig: Take 1 table by mouth daily. If weight 185 or greater take 1 extra tablet (20 mg) at noon. If you have to take for 3 days or more, call.    Dispense:  120 tablet    Refill:  3  . metoprolol succinate (TOPROL-XL) 25 MG 24 hr tablet    Sig: Take 1 tablet (25 mg total) by mouth daily.    Dispense:  30 tablet    Refill:  3    Patient Instructions  Medication Instructions:  "Dry" weight is 182  lbs If your weight is 185 or greater:  -Take the regular lasix 20 mg in the morning  -Take an extra 20 mg at noon If the weight the next day is still 185 lbs or greater, repeat. If you have to take extra lasix for 3 or more days, call the office   Take- whole pill (25 mg) metoprolol daily   -call if you are dizzy   -Goal heart rate < 100 and top blood pressure > 100.  If you need a refill on your cardiac medications before your next appointment, please call your pharmacy.   Lab work: Your physician recommends that you return for lab work today (BMP)  If you have labs (blood work) drawn today and your tests are completely normal, you will receive your results only by: Marland Kitchen MyChart Message (if you have MyChart) OR . A paper copy in the mail If you have any lab test that is abnormal or we need to change your treatment, we will call you  to review the results.  Testing/Procedures: None  Follow-Up: At [facility], you and your health needs are our priority.  As part of our continuing mission to provide you with exceptional heart care, we have created designated Provider Care Teams.  These Care Teams include your primary Cardiologist (physician) and Advanced Practice Providers (APPs -  Physician Assistants and Nurse Practitioners) who all work together to provide you with the care you need, when you need it. You will need a follow up appointment in 6 weeks.  Please call our office 2 months in advance to schedule this appointment.  You may see Buford Dresser, MD or one of the following Advanced Practice Providers on your designated Care Team:   Rosaria Ferries, PA-C . Jory Sims, DNP, ANP  Any Other Special Instructions Will Be Listed Below (If Applicable).       Signed, Buford Dresser, MD PhD 02/20/2018 9:18 AM    [place]

## 2018-02-21 ENCOUNTER — Encounter: Payer: Self-pay | Admitting: Cardiology

## 2018-02-21 DIAGNOSIS — J8489 Other specified interstitial pulmonary diseases: Secondary | ICD-10-CM | POA: Diagnosis not present

## 2018-02-21 DIAGNOSIS — N183 Chronic kidney disease, stage 3 (moderate): Secondary | ICD-10-CM | POA: Diagnosis not present

## 2018-02-21 DIAGNOSIS — I10 Essential (primary) hypertension: Secondary | ICD-10-CM | POA: Diagnosis not present

## 2018-02-21 DIAGNOSIS — I2510 Atherosclerotic heart disease of native coronary artery without angina pectoris: Secondary | ICD-10-CM | POA: Diagnosis not present

## 2018-02-21 DIAGNOSIS — I480 Paroxysmal atrial fibrillation: Secondary | ICD-10-CM | POA: Diagnosis not present

## 2018-02-21 DIAGNOSIS — I5030 Unspecified diastolic (congestive) heart failure: Secondary | ICD-10-CM | POA: Diagnosis not present

## 2018-02-21 DIAGNOSIS — E039 Hypothyroidism, unspecified: Secondary | ICD-10-CM | POA: Diagnosis not present

## 2018-02-21 DIAGNOSIS — E7800 Pure hypercholesterolemia, unspecified: Secondary | ICD-10-CM | POA: Diagnosis not present

## 2018-02-21 DIAGNOSIS — I350 Nonrheumatic aortic (valve) stenosis: Secondary | ICD-10-CM | POA: Diagnosis not present

## 2018-02-21 MED ORDER — METOPROLOL SUCCINATE ER 25 MG PO TB24: 25.0000 mg | ORAL_TABLET | Freq: Every day | ORAL | 3 refills | Status: DC

## 2018-03-04 NOTE — Progress Notes (Signed)
@Patient  ID: [name], male    DOB: Mar 07, 1932, 84 y.o.   MRN: [id number]  Chief Complaint  Patient presents with  . Follow-up    IPF follow up on esbriet     Referring provider: [name], MD  HPI:  84 year old male with undifferentiated interstitial lung disease stable since 2015.  Clinically deemed unrelated to amiodarone taken briefly in 2017. Suspected IPF due to patients age, gender, and negative autoimmune profile.   PMH: AFIb (on coumadin), HTN, CHF  Smoker/ Smoking History: Former Smoker. 0.20 pack year history.  Maintenance:  Esbriet  [name]  03/05/2018  - Visit   84 year old male patient presenting today for 6-week follow-up for Esbriet therapy.  Patient reports that he took Luther for about 3 to 4 weeks and had severe fatigue, weakness and loss of appetite.  Patient stopped taking aspirate at that time.  After discussions with his wife patient and wife both agree they do not want to proceed forward with other anti-fibrotic therapies at this time.  They will continue to think about this but as of right now they are declining OFEV.  Patient spouse reporting that he has been relatively stable since last office visit and has been taking a daily diuretic and weighing himself daily.  Patient has cardiology instructions regarding fluid management.  Patient has been doing well under this management.  Patient's wife does report that he has a cyclical breath pattern of about 8 hard breaths and then 20: Breaths while he is awake as well as when he sleeping.  Patient and spouse do not believe these are apneic episodes.    STOP BANG questionnaire  Snoring? no Tiredness? no Observed apneas? No  Elevated blood pressure? Yes  BMI greater than 35? No  Age greater than 50? yes Neck circumference greater than 40 cm? no Male gender? Yes   Scoring:  One-point is signed for each.    0-2 equals low risk, 3-4 equals intermediate risk, those with 5 or greater  high risk for having obstructive sleep apnea  Score: 3    Tests:   09/17/2015-CT chest high-res- basilar predominant patchy subpleural reticulation in both lungs no significant traction bronchiectasis or frank honeycombing.  No definite change since 02/07/2014.  Findings most consistent with NSIP.  05/15/2016-CT chest high-res- appearance of chest is compatible with ILD, strongly favored to reflect NSIP  01/02/2018-CT chest high-res-spectrum of findings and lungs remain compatible with interstitial lung disease, appears stable compared to prior examinations again favored to represent NSIP, technically classified as ATS indeterminate for UIP, aortic arthrosclerosis  01/22/2018-pulmonary function test-FVC 2.55 (68% predicted), ratio 71, FEV1 76, DLCO 63  12/25/2017-echocardiogram-LV ejection fraction 20 to 25%, diffuse hypokinesis, PAP pressure 48  07/21/2015-echocardiogram-LV ejection fraction 55 to 14%, grade 3 diastolic dysfunction  FENO:  No results found for: NITRICOXIDE  PFT: PFT Results Latest Ref Rng & Units 01/22/2018 11/21/2016 04/02/2016 08/31/2015  FVC-Pre L 2.55 3.08 3.05 3.24  FVC-Predicted Pre % 68 82 80 85  FVC-Post L - - 2.99 3.24  FVC-Predicted Post % - - 79 85  Pre FEV1/FVC % % 78 81 82 81  Post FEV1/FCV % % - - 86 84  FEV1-Pre L 1.99 2.49 2.50 2.61  FEV1-Predicted Pre % 76 94 94 97  FEV1-Post L - - 2.57 2.73  DLCO UNC% % 63 78 68 75  DLCO COR %Predicted % 105 103 90 100  TLC L - - - 5.14  TLC % Predicted % - - -  74  RV % Predicted % - - - 69    Imaging: Dg Chest 2 View  Result Date: 02/14/2018 CLINICAL DATA:  Generalized weakness with shortness of breath. EXAM: CHEST - 2 VIEW COMPARISON:  01/12/2018 FINDINGS: The heart size is enlarged. Previous median sternotomy CABG procedure. Small bilateral pleural effusions and interstitial edema identified. No airspace opacities. Visualized osseous structures are unremarkable. IMPRESSION: 1. Mild congestive heart failure.  Electronically Signed   By: Kerby Moors M.D.   On: 02/14/2018 18:45      Specialty Problems      Pulmonary Problems   ILD (interstitial lung disease) ([place])    09/17/2015-CT chest high-res- basilar predominant patchy subpleural reticulation in both lungs no significant traction bronchiectasis or frank honeycombing.  No definite change since 02/07/2014.  Findings most consistent with NSIP. 05/15/2016-CT chest high-res- appearance of chest is compatible with ILD, strongly favored to reflect NSIP  PFT Results Latest Ref Rng & Units 11/21/2016 04/02/2016 08/31/2015  FVC-Pre L 3.08 3.05 3.24  FVC-Predicted Pre % 82 80 85  FVC-Post L - 2.99 3.24  FVC-Predicted Post % - 79 85  Pre FEV1/FVC % % 81 82 81  Post FEV1/FCV % % - 86 84  FEV1-Pre L 2.49 2.50 2.61  FEV1-Predicted Pre % 94 94 97  DLCO UNC% % 78 68 75  DLCO COR %Predicted % 103 90 100   12/24/17>>> ordered high-res CT>>> 12/24/17>>> ordered pre bd spirometry with DLCO>>>      Dyspnea on exertion and orthopnea    09/17/2015-CT chest high-res- basilar predominant patchy subpleural reticulation in both lungs no significant traction bronchiectasis or frank honeycombing.  No definite change since 02/07/2014.  Findings most consistent with NSIP. 05/15/2016-CT chest high-res- appearance of chest is compatible with ILD, strongly favored to reflect NSIP  07/21/2015-echocardiogram-LV ejection fraction 55 to 71%, grade 3 diastolic dysfunction  69/6/78>>>LFYBOF, bMet, CBC, TSH>>> 12/24/17>>> ordered high-res CT>>> 12/24/17>>> ordered pre bd spirometry with DLCO>>>         Allergies  Allergen Reactions  . Asa [Aspirin] Other (See Comments)    Bleeding- has internal hemorrhoids  . Flomax [Tamsulosin] Other (See Comments)    Made his heart go out of rhythm    Immunization History  Administered Date(s) Administered  . Influenza Split 02/09/2015  . Influenza, High Dose Seasonal PF 12/18/2015, 12/31/2017  . Pneumococcal Conjugate-13 03/20/2015     Patient believes he has had the Pneumovax 23 at the [place], patient also due for tetanus vaccine Patient to follow-up with the [facility] regarding the status  Past Medical History:  Diagnosis Date  . Adenomatous colon polyp   . Aortic stenosis    a. mild by echo 2017.  Marland Kitchen CAD (coronary artery disease)    a. S/P CABG x 1 @ time of MV annuloplasty;  b. 02/2010 ETT: neg for ischemia.  . Carotid artery stenosis    1-39% by dopplers 10/2016  . Chronic diastolic CHF (congestive heart failure) ([place])   . CKD (chronic kidney disease), stage III ([place])   . Current use of long term anticoagulation    a. Coumadin in setting of afib.  . Diverticulosis    Colonoscopy  . Dyslipidemia   . Essential hypertension   . H/O mitral valve repair   . Hemorrhoids   . Hyperlipidemia   . Hypertension   . Permanent atrial fibrillation    a. CHA2DS2VASc = 6 -->chronic coumadin;  b. Prev on amio-->d/c 2/2 hypothyroidism. >> resumed 5/16. c. Notes from 2017 indicate  interstitial lung disease by pulm appt, question amio toxicity, also increased liver attenuation. Rate control pursued and amiodarone discontinued.  Marland Kitchen TIA (transient ischemic attack)     Tobacco History: Social History   Tobacco Use  Smoking Status Former Smoker  . Packs/day: 0.10  . Years: 2.00  . Pack years: 0.20  . Types: Cigarettes  . Last attempt to quit: 03/20/1955  . Years since quitting: 63.0  Smokeless Tobacco Never Used   Counseling given: Yes  Continue to not smoke  Outpatient Encounter Medications as of 03/05/2018  Medication Sig  . amoxicillin (AMOXIL) 500 MG tablet 500 mg. Take 2000 mg by mouth one hour prior to dental visits  . Cholecalciferol (VITAMIN D-3) 1000 units CAPS Take by mouth daily.   . furosemide (LASIX) 20 MG tablet Take 1 table by mouth daily. If weight 185 or greater take 1 extra tablet (20 mg) at noon. If you have to take for 3 days or more, call.  . hydrocortisone (ANUSOL-HC) 2.5 % rectal cream Place 1 application  rectally 2 (two) times daily.  Marland Kitchen levothyroxine (SYNTHROID, LEVOTHROID) 50 MCG tablet Take 50 mcg by mouth at bedtime.   . metoprolol succinate (TOPROL-XL) 25 MG 24 hr tablet Take 1 tablet (25 mg total) by mouth daily.  Marland Kitchen warfarin (COUMADIN) 4 MG tablet Take 4 mg by mouth every evening.  . ESBRIET 267 MG TABS Take 267 mg by mouth 3 (three) times daily.  . pravastatin (PRAVACHOL) 80 MG tablet Take 1 tablet (80 mg total) by mouth every evening. (Patient taking differently: Take 80 mg by mouth every Monday, Wednesday, and Friday. )   No facility-administered encounter medications on file as of 03/05/2018.      Review of Systems  Review of Systems  Constitutional: Positive for appetite change (On Esbriet, food did not taste good) and fatigue (Worse on esbriet,  Has improved since stopping). Negative for activity change, chills, fever and unexpected weight change.  HENT: Negative for postnasal drip, sneezing and sore throat.   Eyes: Negative.   Respiratory: Positive for shortness of breath (With exertion). Negative for cough and wheezing.   Cardiovascular: Negative for chest pain and palpitations.  Gastrointestinal: Negative for constipation, diarrhea, nausea and vomiting.  Endocrine: Negative.   Musculoskeletal: Negative.   Skin: Negative.   Neurological: Negative for dizziness and headaches.  Psychiatric/Behavioral: Negative.  Negative for dysphoric mood. The patient is not nervous/anxious.   All other systems reviewed and are negative.    Physical Exam  BP 102/62 (BP Location: Left Arm, Cuff Size: Normal)   Pulse 94   Ht 5\' 8"  (1.727 m)   Wt 184 lb (83.5 kg)   SpO2 97%   BMI 27.98 kg/m   Wt Readings from Last 5 Encounters:  03/05/18 184 lb (83.5 kg)  02/20/18 187 lb (84.8 kg)  02/14/18 190 lb (86.2 kg)  01/22/18 188 lb (85.3 kg)  01/22/18 188 lb 14.1 oz (85.7 kg)   >>> weight stable  Physical Exam  Constitutional: He is oriented to person, place, and time and  well-developed, well-nourished, and in no distress. Vital signs are normal. No distress.  HENT:  Head: Normocephalic and atraumatic.  Right Ear: Hearing, tympanic membrane, external ear and ear canal normal.  Left Ear: Hearing, tympanic membrane, external ear and ear canal normal.  Nose: Mucosal edema present.  Mouth/Throat: Uvula is midline and oropharynx is clear and moist. No oropharyngeal exudate.  + Hard of hearing, patient has hearing aids but they need to  be replaced, patient getting new hearing aids in spring/2020  + MP 1  Eyes: Pupils are equal, round, and reactive to light.  Neck: Normal range of motion. Neck supple. No JVD present.  Cardiovascular: Normal rate, regular rhythm and normal heart sounds.  Pulmonary/Chest: Effort normal and breath sounds normal. No accessory muscle usage. No respiratory distress. He has no decreased breath sounds. He has no wheezes. He has no rhonchi.  Abdominal: Soft. Bowel sounds are normal. There is no abdominal tenderness.  Musculoskeletal: Normal range of motion.        General: No edema.  Lymphadenopathy:    He has no cervical adenopathy.  Neurological: He is alert and oriented to person, place, and time. Gait normal.  Skin: He is not diaphoretic.  Psychiatric: Mood, memory, affect and judgment normal.  Nursing note and vitals reviewed.     Lab Results:  CBC    Component Value Date/Time   WBC 6.6 02/14/2018 1752   RBC 4.75 02/14/2018 1752   HGB 13.7 02/14/2018 1752   HCT 44.7 02/14/2018 1752   PLT 112 (L) 02/14/2018 1752   MCV 94.1 02/14/2018 1752   MCH 28.8 02/14/2018 1752   MCHC 30.6 02/14/2018 1752   RDW 15.6 (H) 02/14/2018 1752   LYMPHSABS 1.0 02/14/2018 1752   MONOABS 0.6 02/14/2018 1752   EOSABS 0.1 02/14/2018 1752   BASOSABS 0.0 02/14/2018 1752    BMET    Component Value Date/Time   NA 142 02/20/2018 0907   K 4.0 02/20/2018 0907   CL 103 02/20/2018 0907   CO2 21 02/20/2018 0907   GLUCOSE 89 02/20/2018 0907    GLUCOSE 92 02/14/2018 1752   BUN 28 (H) 02/20/2018 0907   CREATININE 1.66 (H) 02/20/2018 0907   CREATININE 1.80 (H) 12/23/2015 0742   CALCIUM 9.5 02/20/2018 0907   GFRNONAA 38 (L) 02/20/2018 0907   GFRAA 43 (L) 02/20/2018 0907    BNP    Component Value Date/Time   BNP 1,407.0 (H) 02/14/2018 1752    ProBNP    Component Value Date/Time   PROBNP 10,355 (H) 12/24/2017 1159   PROBNP 198.9 02/06/2014 1800      Assessment & Plan:   Pleasant 84 year old male patient completing follow-up with our office today.  Patient failed Esbriet therapy.  I discussed this patient's case with Dr. Chase Caller and patient may be a candidate for clinical trials for IV medication or inhaler medications for management of his interstitial lung disease and suspected IPF.  Patient declined Ofev therapy today.  Patient to keep follow-up with Dr. Chase Caller in January/2020 for further discussions and follow-up.  ILD (interstitial lung disease) ([place]) Okay to stop esbriet   Keep follow up with Dr. Chase Caller  Idiopathic Pulmonary Fibrosis Support Group  >>> [email or web address] >>>Contact Name: Marlane Mingle   [facility]  >>>check status of Pneumovax 23  >>>TDAP   CHF (congestive heart failure) [facility]) Patient Education for Congestive Heart Failure  Do the following things EVERY DAY:   1. Weigh yourself EVERY morning after you go to the bathroom but before you eat or drink anything. Write this number down in a weight log/diary.    2. Take your medicines as prescribed. If you have concerns about your medications, please call us before you stop taking them.    3. Eat low salt foods-Limit salt (sodium) to 2000 mg per day. This will help prevent your body from holding onto fluid. Read food labels as many processed foods have a lot of sodium,  especially canned goods and prepackaged meats. If you would like some assistance choosing low sodium foods, we would be happy to set you up with a nutritionist.   4. Stay  as active as you can everyday. Staying active will give you more energy and make your muscles stronger. Start with 5 minutes at a time and work your way up to 30 minutes a day. Break up your activities--do some in the morning and some in the afternoon. Start with 3 days per week and work your way up to 5 days as you can.  If you have chest pain, feel short of breath, dizzy, or lightheaded, STOP. If you don't feel better after a short rest, call 911. If you do feel better, call the office to let us know you have symptoms with exercise.   5. Limit all fluids for the day to less than 2 liters. Fluid includes all drinks, coffee, juice, ice chips, soup, jello, and all other liquids.      Lauraine Rinne, NP 03/05/2018   This appointment was 28 min long with over 50% of the time in direct face-to-face patient care, assessment, plan of care, and follow-up.

## 2018-03-05 ENCOUNTER — Ambulatory Visit (INDEPENDENT_AMBULATORY_CARE_PROVIDER_SITE_OTHER): Payer: PPO | Admitting: Pulmonary Disease

## 2018-03-05 ENCOUNTER — Ambulatory Visit: Payer: PPO | Admitting: Pulmonary Disease

## 2018-03-05 ENCOUNTER — Encounter: Payer: Self-pay | Admitting: Pulmonary Disease

## 2018-03-05 DIAGNOSIS — J849 Interstitial pulmonary disease, unspecified: Secondary | ICD-10-CM | POA: Diagnosis not present

## 2018-03-05 DIAGNOSIS — I5042 Chronic combined systolic (congestive) and diastolic (congestive) heart failure: Secondary | ICD-10-CM

## 2018-03-05 NOTE — Assessment & Plan Note (Signed)
Patient Education for Congestive Heart Failure  Do the following things EVERY DAY:   1. Weigh yourself EVERY morning after you go to the bathroom but before you eat or drink anything. Write this number down in a weight log/diary.    2. Take your medicines as prescribed. If you have concerns about your medications, please call us before you stop taking them.    3. Eat low salt foods-Limit salt (sodium) to 2000 mg per day. This will help prevent your body from holding onto fluid. Read food labels as many processed foods have a lot of sodium, especially canned goods and prepackaged meats. If you would like some assistance choosing low sodium foods, we would be happy to set you up with a nutritionist.   4. Stay as active as you can everyday. Staying active will give you more energy and make your muscles stronger. Start with 5 minutes at a time and work your way up to 30 minutes a day. Break up your activities--do some in the morning and some in the afternoon. Start with 3 days per week and work your way up to 5 days as you can.  If you have chest pain, feel short of breath, dizzy, or lightheaded, STOP. If you don't feel better after a short rest, call 911. If you do feel better, call the office to let us know you have symptoms with exercise.   5. Limit all fluids for the day to less than 2 liters. Fluid includes all drinks, coffee, juice, ice chips, soup, jello, and all other liquids.

## 2018-03-05 NOTE — Patient Instructions (Addendum)
Okay to stop esbriet   Keep follow up with Dr. Chase Caller  Idiopathic Pulmonary Fibrosis Support Group  >>> [email or web address] >>>Contact Name: Marlane Mingle   [facility]  >>>check status of Pneumovax 23  >>>TDAP     It is flu season:   >>>Remember to be washing your hands regularly, using hand sanitizer, be careful to use around herself with has contact with people who are sick will increase her chances of getting sick yourself. >>> Best ways to protect herself from the flu: Receive the yearly flu vaccine, practice good hand hygiene washing with soap and also using hand sanitizer when available, eat a nutritious meals, get adequate rest, hydrate appropriately   Please contact the office if your symptoms worsen or you have concerns that you are not improving.   Thank you for choosing [facility] for your healthcare, and for allowing [place] to partner with you on your healthcare journey. I am thankful to be able to provide care to you today.   Wyn Quaker FNP-C

## 2018-03-05 NOTE — Assessment & Plan Note (Signed)
Okay to stop esbriet   Keep follow up with Dr. Chase Caller  Idiopathic Pulmonary Fibrosis Support Group  >>> [email or web address] >>>Contact Name: Marlane Mingle   [facility]  >>>check status of Pneumovax 23  >>>TDAP

## 2018-03-10 ENCOUNTER — Other Ambulatory Visit: Payer: Self-pay | Admitting: Cardiology

## 2018-03-10 DIAGNOSIS — I4891 Unspecified atrial fibrillation: Secondary | ICD-10-CM

## 2018-03-10 DIAGNOSIS — I5032 Chronic diastolic (congestive) heart failure: Secondary | ICD-10-CM

## 2018-03-10 MED ORDER — METOPROLOL SUCCINATE ER 25 MG PO TB24: 25.0000 mg | ORAL_TABLET | Freq: Every day | ORAL | 5 refills | Status: DC

## 2018-03-10 NOTE — Telephone Encounter (Signed)
° ° °*  STAT* If patient is at the pharmacy, call can be transferred to refill team.   1. Which medications need to be refilled? (please list name of each medication and dose if known) metoprolol succinate (TOPROL-XL) 25 MG 24 hr tablet  2. Which pharmacy/location (including street and city if local pharmacy) is medication to be sent to? [facility] # [place], [place]  3. Do they need a 30 day or 90 day supply? [place]

## 2018-04-10 ENCOUNTER — Encounter: Payer: Self-pay | Admitting: Cardiology

## 2018-04-10 ENCOUNTER — Encounter (INDEPENDENT_AMBULATORY_CARE_PROVIDER_SITE_OTHER): Payer: Self-pay

## 2018-04-10 ENCOUNTER — Ambulatory Visit: Payer: PPO | Admitting: Cardiology

## 2018-04-10 VITALS — BP 141/76 | HR 106 | Ht 68.0 in | Wt 188.4 lb

## 2018-04-10 DIAGNOSIS — Z7901 Long term (current) use of anticoagulants: Secondary | ICD-10-CM | POA: Diagnosis not present

## 2018-04-10 DIAGNOSIS — I5042 Chronic combined systolic (congestive) and diastolic (congestive) heart failure: Secondary | ICD-10-CM | POA: Diagnosis not present

## 2018-04-10 DIAGNOSIS — R0609 Other forms of dyspnea: Secondary | ICD-10-CM

## 2018-04-10 DIAGNOSIS — G4730 Sleep apnea, unspecified: Secondary | ICD-10-CM

## 2018-04-10 DIAGNOSIS — I4891 Unspecified atrial fibrillation: Secondary | ICD-10-CM | POA: Diagnosis not present

## 2018-04-10 DIAGNOSIS — R531 Weakness: Secondary | ICD-10-CM

## 2018-04-10 MED ORDER — FUROSEMIDE 20 MG PO TABS: ORAL_TABLET | ORAL | 3 refills | Status: DC

## 2018-04-10 MED ORDER — METOPROLOL SUCCINATE ER 25 MG PO TB24: 37.5000 mg | ORAL_TABLET | Freq: Every day | ORAL | 5 refills | Status: DC

## 2018-04-10 NOTE — Progress Notes (Signed)
Cardiology Office Note:    Date:  04/10/2018   ID:  [name], DOB February 20, 1932, MRN [id number]  PCP:  [name], MD  Cardiologist:  Buford Dresser, MD PhD  Referring MD: [name], MD   CC: follow up  History of Present Illness:    [name] is a 85 y.o. male with a hx of CAD s/p CABG, mitral valve repair, atrial fibrillation on anticoagulation with coumadin (managed by [facility]), interstitial lung disease, chronic diastolic heart failure who is seen in follow up today for the evaluation and management of systolic and diastolic heart failure, atrial fibrillation. He follows at the [facility] in [place], and he receives his medication and warfarin monitoring there.   Cardiac history: I met Mr. Ledee during his recent hospitalization. He was discharged on 12/27/17 after hospitalization for acute on chronic diastolic heart failure and atrial fibrillation with rapid ventricular response. During his hospitalization, he had a new drop in his ejection fraction, consistent with new acute systolic heart failure in addition to his diastolic heart failure. He was diuresed >5 L, with weight change from 190 lbs to 179 lbs at discharge. His hypotension had limited rate control agents in the past, and he was not on a beta blocker at the time of admission. He was titrated onto a low dose of beta blocker, limited by symptoms. His blood pressure was too low to add ACEI/ARB.   Prior follow up visits have focused on titration of medications to balance blood pressure, heart rate, and fluid retention.   Today: feels weak, no energy. Has been slowly progressing since his hospitalization in the fall. Wife continues to endorse concerns re: heavy breathing and periods of apnea when he sleeps. Again notes that he may go for 12 seconds or more between breaths and then gasp for the next breath. He is tired all throughout the day, feels like he never has energy. We discussed this at last visit as well,  and they would like a sleep study through our office and not the [facility].   Blood pressures at home: lowest have been upper 80s/60s, a few in mid-90s/uppers 60s, most low 100s/ upper 60s. Has a few 120/70 in recent readings. Has been taking entire 25 mg metoprolol succinate daily without issues. No syncope or falls.   Weights have been stable. Taking lasix when he feels like he isn't urinating enough, around 3x/week.   Past Medical History:  Diagnosis Date  . Adenomatous colon polyp   . Aortic stenosis    a. mild by echo 2017.  Marland Kitchen CAD (coronary artery disease)    a. S/P CABG x 1 @ time of MV annuloplasty;  b. 02/2010 ETT: neg for ischemia.  . Carotid artery stenosis    1-39% by dopplers 10/2016  . Chronic diastolic CHF (congestive heart failure) ([place])   . CKD (chronic kidney disease), stage III ([place])   . Current use of long term anticoagulation    a. Coumadin in setting of afib.  . Diverticulosis    Colonoscopy  . Dyslipidemia   . Essential hypertension   . H/O mitral valve repair   . Hemorrhoids   . Hyperlipidemia   . Hypertension   . Permanent atrial fibrillation    a. CHA2DS2VASc = 6 -->chronic coumadin;  b. Prev on amio-->d/c 2/2 hypothyroidism. >> resumed 5/16. c. Notes from 2017 indicate interstitial lung disease by pulm appt, question amio toxicity, also increased liver attenuation. Rate control pursued and amiodarone discontinued.  Marland Kitchen TIA (transient  ischemic attack)     Past Surgical History:  Procedure Laterality Date  . CORONARY ARTERY BYPASS GRAFT    . MITRAL VALVE REPAIR      Current Medications: Current Outpatient Medications on File Prior to Visit  Medication Sig  . amoxicillin (AMOXIL) 500 MG tablet 500 mg. Take 2000 mg by mouth one hour prior to dental visits  . Cholecalciferol (VITAMIN D-3) 1000 units CAPS Take by mouth daily.   . hydrocortisone (ANUSOL-HC) 2.5 % rectal cream Place 1 application rectally 2 (two) times daily.  Marland Kitchen levothyroxine (SYNTHROID,  LEVOTHROID) 50 MCG tablet Take 50 mcg by mouth at bedtime.   . pravastatin (PRAVACHOL) 80 MG tablet Take 1 tablet (80 mg total) by mouth every evening. (Patient taking differently: Take 80 mg by mouth every Monday, Wednesday, and Friday. )  . warfarin (COUMADIN) 4 MG tablet Take 4 mg by mouth every evening.   No current facility-administered medications on file prior to visit.      Allergies:   Asa [aspirin] and Flomax [tamsulosin]   Social History   Socioeconomic History  . Marital status: Married    Spouse name: Not on file  . Number of children: Not on file  . Years of education: Not on file  . Highest education level: Not on file  Occupational History  . 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 Financial resource strain: Not on file  . Food insecurity:    Worry: Not on file    Inability: Not on file  . Transportation needs:    Medical: Not on file    Non-medical: Not on file  Tobacco Use  . Smoking status: Former Smoker    Packs/day: 0.10    Years: 2.00    Pack years: 0.20    Types: Cigarettes    Last attempt to quit: 03/20/1955    Years since quitting: 63.1  . Smokeless tobacco: Never Used  Substance and Sexual Activity  . Alcohol use: No    Alcohol/week: 0.0 standard drinks  . Drug use: No  . Sexual activity: Not Currently  Lifestyle  . Physical activity:    Days per week: Not on file    Minutes per session: Not on file  . Stress: Not on file  Relationships  . Social connections:    Talks on phone: Not on file    Gets together: Not on file    Attends religious service: Not on file    Active member of club or organization: Not on file    Attends meetings of clubs or organizations: Not on file    Relationship status: Not on file  Other Topics Concern  . Not on file  Social History Narrative  . Not on file     Family History: The patient's family history includes Heart disease in his father; Heart failure in his brother; Stroke in  his brother and mother.  ROS:   Please see the history of present illness.  Additional pertinent ROS:  Constitutional: Negative for chills, fever, night sweats, unintentional weight loss. Positive for general fatigue.  HENT: Negative for ear pain, has chronic hearing loss.   Eyes: Negative for loss of vision and eye pain.  Respiratory: Positive for dyspnea on exertion. Negative for cough, sputum, shortness of breath at rest, wheezing.   Cardiovascular: Negative for chest pain, palpitations, PND, orthopnea, and claudication. Trace LE edema. Gastrointestinal: Negative for abdominal pain, melena, and hematochezia.  Genitourinary: Negative for dysuria  and hematuria.  Musculoskeletal: Negative for falls and myalgias.  Skin: Negative for itching and rash.  Neurological: Negative for focal weakness, focal sensory changes and loss of consciousness.  Endo/Heme/Allergies: Does bruise/bleed easily.    EKGs/Labs/Other Studies Reviewed:    The following studies were reviewed today:  Echo 12/25/17 Study Conclusions - Left ventricle: The cavity size was mildly dilated. Systolic function was severely reduced. The estimated ejection fraction was in the range of 20% to 25%. Severe diffuse hypokinesis. The study was not technically sufficient to allow evaluation of LV diastolic dysfunction due to atrial fibrillation. Doppler parameters are consistent with high ventricular filling pressure. - Aortic valve: There was mild regurgitation. Valve area (VTI): 0.7 cm^2. Valve area (Vmax): 0.85 cm^2. Valve area (Vmean): 0.76 cm^2. - Mitral valve: S/P prior MV repair Moderate diffuse thickening and calcification of the anterior leaflet and posterior leaflet, with mild involvement of chords. Mobility of the posterior leaflet was restricted to the point of immobility. There was mild regurgitation. - Left atrium: The atrium was mildly dilated. - Right ventricle: Systolic function was  mildly reduced. - Tricuspid valve: There was trivial regurgitation. - Pulmonary arteries: PA peak pressure: 48 mm Hg (S).  Impressions: - Mildly dilated LV with severe LV dysfunction. Cannot assess diastolic function due to underlying atrial fibrillation but there is increased LV filling pressures. S/P MV repair with mild MR and moderately thickened MV leaflets. The posterior leaflet appears fixed. The AV is poorly visualized but there is calcification of the valve. Cannot assess degree of AS given poor visualization and gradients may be falsely low in setting of LV dysfunction. Consider TEE for further evaluation.  EKG:  Personally reviewed. The ekg ordered today demonstrates atrial fibrillation with RVR, rate 106 bpm.  Recent Labs: 12/24/2017: Magnesium 2.0; NT-Pro BNP 10,355; TSH 3.558 02/14/2018: ALT 28; B Natriuretic Peptide 1,407.0; Hemoglobin 13.7; Platelets 112 02/20/2018: BUN 28; Creatinine, Ser 1.66; Potassium 4.0; Sodium 142  Recent Lipid Panel    Component Value Date/Time   CHOL 193 06/17/2017 0744   TRIG 114 06/17/2017 0744   HDL 35 (L) 06/17/2017 0744   CHOLHDL 5.5 (H) 06/17/2017 0744   CHOLHDL 2.6 03/16/2016 0903   VLDL 20 03/16/2016 0903   LDLCALC 135 (H) 06/17/2017 0744    Physical Exam:    VS:  BP (!) 141/76   Pulse (!) 106   Ht 5' 8"  (1.727 m)   Wt 188 lb 6.4 oz (85.5 kg)   BMI 28.65 kg/m     Wt Readings from Last 3 Encounters:  04/10/18 188 lb 6.4 oz (85.5 kg)  03/05/18 184 lb (83.5 kg)  02/20/18 187 lb (84.8 kg)     GEN: Well nourished, well developed in no acute distress HEENT: Normal NECK: JVD 8 cm; No carotid bruits LYMPHATICS: No lymphadenopathy CARDIAC: irregular rhythm, normal S1 and S2, no rubs, gallops. 2/6 harsh Early to mid peaking SEM with audible S2. Radial and DP pulses 2+ bilaterally. RESPIRATORY:  Largely clear bilaterally except for fine crackles throughout. No rales. ABDOMEN: Soft, non-tender,  non-distended MUSCULOSKELETAL:  Trace LE edema; No deformity  SKIN: Warm and dry NEUROLOGIC:  Alert and oriented x 3 PSYCHIATRIC:  Normal affect   ASSESSMENT:    1. Atrial fibrillation with RVR ([place])   2. Sleep apnea, unspecified type   3. Generalized weakness   4. Current use of long term anticoagulation   5. Chronic combined systolic and diastolic heart failure ([place])   6. Dyspnea on exertion  PLAN:   Chronic systolic and diastolic heart failure: systolic failure diagnosed in 12/2017 -appears euvolemic today. Reviewed home weight logs, indications for furosemide dose. Did extensive HF education last visit, reviewed briefly.  -on metoprolol succinate -working to optimize his heart rate prior to repeating echo. Ideally, would like HR below 100 while maintaining systolic BP >941 -his blood pressure cannot tolerate ACEI/ARB/ARNI/MRA at this point.  "Dry" weight is 182 lbs If your weight is 185 or greater:  -Take the regular lasix 20 mg in the morning  -Take an extra 20 mg at noon If the weight the next day is still 185 lbs or greater, repeat. If you have to take extra lasix for 3 or more days, call the office   Atrial Fibrillation w/ RVR: Continues to be a challenge. Improved from hospitalization, when resting rates in the 120s and elevations >160 with exertion. However, it is a tight balance between heart rate control and blood pressure. Has the highest blood pressure today that he has ever had, but on his home readings does have some lows. No syncope or falls.  -as his BP is better today, will increase metoprolol to 37.5 mg daily. Instructed to go back to 25 mg daily dose if he feels dizzy or BP consistently <740 systolic.  -not controlled on amiodarone in the past, no diltiazem given low EF, declines digoxin after shared decision making.  -INR followed by the [facility]. Have discussed DOAC in the past.   Dyspnea on exertion, generalized weakness, concerns for sleep apnea events:   -follows with pulmonology, recently stopped Esbriet for ILD/IPF -appears euvolemic today -apneic episodes are concerning. With his difficult to control afib, sleep apnea or nocturnal hypoxia would exacerbate this. Has never had sleep study, discussed again, would like to have done through [place] and not [facility]. We will order today  Not discussed at length today: History of CKD.  CAD: remote h/o CABG. Denies CP.  Goal LDL <70, previously LDL 135 (not at goal). On pravastatin 80 mg. Would like to be on high intensity statin, but will make gradual medication changes. No aspirin given need for anticoagulation. No symptoms.  Mitral Regurgitation: h/o MV repair. Mild MR on echo. Aortic Stenosis: mild AS noted on prior echo in 2017. Unclear level of stenosis on most recent echo.   Plan for follow up: 3 mos or sooner PRN  TIME SPENT WITH PATIENT: >25 minutes of direct patient care. More than 50% of that time was spent on coordination of care and counseling regarding atrial fibrillation with RVR, heart failure management.  Buford Dresser, MD, PhD [place]  [facility]   Medication Adjustments/Labs and Tests Ordered: Current medicines are reviewed at length with the patient today.  Concerns regarding medicines are outlined above.  Orders Placed This Encounter  Procedures  . EKG 12-Lead  . Split night study   Meds ordered this encounter  Medications  . metoprolol succinate (TOPROL-XL) 25 MG 24 hr tablet    Sig: Take 1.5 tablets (37.5 mg total) by mouth daily.    Dispense:  45 tablet    Refill:  5  . furosemide (LASIX) 20 MG tablet    Sig: Take 1 table by mouth daily. If weight 185 or greater take 1 extra tablet (20 mg) at noon. If you have to take for 3 days or more, call.    Dispense:  90 tablet    Refill:  3    Patient Instructions  Medication Instructions:  Increase: Metoprolol 37.5 mg  daily If you need a refill on your cardiac medications before your next appointment, please  call your pharmacy.   Lab work: None  Testing/Procedures: Your physician has recommended that you have a sleep study. This test records several body functions during sleep, including: brain activity, eye movement, oxygen and carbon dioxide blood levels, heart rate and rhythm, breathing rate and rhythm, the flow of air through your mouth and nose, snoring, body muscle movements, and chest and belly movement. [facility]   Follow-Up: At [facility], you and your health needs are our priority.  As part of our continuing mission to provide you with exceptional heart care, we have created designated Provider Care Teams.  These Care Teams include your primary Cardiologist (physician) and Advanced Practice Providers (APPs -  Physician Assistants and Nurse Practitioners) who all work together to provide you with the care you need, when you need it. You will need a follow up appointment in 3 months.  Please call our office 2 months in advance to schedule this appointment.  You may see Buford Dresser, MD or one of the following Advanced Practice Providers on your designated Care Team:   Rosaria Ferries, PA-C . Jory Sims, DNP, ANP        Signed, Buford Dresser, MD PhD 04/10/2018 12:54 PM    [place]

## 2018-04-10 NOTE — Patient Instructions (Signed)
Medication Instructions:  Increase: Metoprolol 37.5 mg daily If you need a refill on your cardiac medications before your next appointment, please call your pharmacy.   Lab work: None  Testing/Procedures: Your physician has recommended that you have a sleep study. This test records several body functions during sleep, including: brain activity, eye movement, oxygen and carbon dioxide blood levels, heart rate and rhythm, breathing rate and rhythm, the flow of air through your mouth and nose, snoring, body muscle movements, and chest and belly movement. [facility]   Follow-Up: At [facility], you and your health needs are our priority.  As part of our continuing mission to provide you with exceptional heart care, we have created designated Provider Care Teams.  These Care Teams include your primary Cardiologist (physician) and Advanced Practice Providers (APPs -  Physician Assistants and Nurse Practitioners) who all work together to provide you with the care you need, when you need it. You will need a follow up appointment in 3 months.  Please call our office 2 months in advance to schedule this appointment.  You may see Buford Dresser, MD or one of the following Advanced Practice Providers on your designated Care Team:   Rosaria Ferries, PA-C . Jory Sims, DNP, ANP

## 2018-04-11 ENCOUNTER — Telehealth: Payer: Self-pay | Admitting: *Deleted

## 2018-04-11 NOTE — Telephone Encounter (Signed)
PA submitted via web portal to [facility] for in lab sleep study.

## 2018-04-11 NOTE — Telephone Encounter (Signed)
-----   Message from Meryl Crutch, RN sent at 04/10/2018  9:54 AM EST ----- Pt has an order for sleep study. Thanks

## 2018-04-16 ENCOUNTER — Telehealth: Payer: Self-pay | Admitting: *Deleted

## 2018-04-16 NOTE — Telephone Encounter (Signed)
Both patient and wife notified of 05/29/18 sleep study appointment.

## 2018-04-16 NOTE — Telephone Encounter (Signed)
-----   Message from Meryl Crutch, RN sent at 04/10/2018  9:54 AM EST ----- Pt has an order for sleep study. Thanks

## 2018-04-17 ENCOUNTER — Ambulatory Visit (INDEPENDENT_AMBULATORY_CARE_PROVIDER_SITE_OTHER): Payer: PPO | Admitting: Internal Medicine

## 2018-04-17 ENCOUNTER — Encounter: Payer: Self-pay | Admitting: Internal Medicine

## 2018-04-17 VITALS — BP 118/64 | HR 111 | Ht 68.0 in | Wt 187.0 lb

## 2018-04-17 DIAGNOSIS — J84112 Idiopathic pulmonary fibrosis: Secondary | ICD-10-CM

## 2018-04-17 NOTE — Progress Notes (Signed)
PCP [name], MD   HPI   IOV 09/09/2015  Chief Complaint  Patient presents with  . Advice Only    Referred by Dr. Radford Pax for abnormal PFT.  PFT from 08/31/15 in [place]. Pt has no breathing complaints.     85 year old male referred by Dr. Radford Pax in cardiology for abnormal pulmonary function test. He presents with his wife. At baseline he says he does not have any problems. Only thing was in 2015 he got admitted for weakness according to chart review. At that time he had a CT scan of the chest high resolution that was read by thoracic radiology and the possibility of bilateral bibasal atelectasis versus subtle interstitial lung disease was raised. However he is continued to remain asymptomatic. Sometime a year ago he was started on amiodarone for a few weeks but then most recently a few months ago went back on amiodarone because of atrial fibrillation according to him and chart review and his wife. He subsequently had pulmonary function test Pulmonary function test personally visualized and done on 08/31/2015 was normal except for mild restriction on total lung capacity 5.14 L/74% and slight reduction in DLCO 23.26/75%. Correlating with this is a 2015 CT chest read by thoracic radiologist suggesting possible ILD versus basal atelectasis  Therefore he has been referred here. He is puzzled by the referral because he feels he is asymptomatic. He works out regularly at fitness and feels well.   OV 05/01/2016  Chief Complaint  Patient presents with  . Follow-up    Pt states his SOB is unchanged since last OV. Pt states he is getting over a URI, c/o prod cough with clear mucus. Pt denies SOB and CP/tightness.    [name] seen in the summer of 2017. At that time we did a CT scan of the chest that suggested persistent but stable interstitial lung disease findings compared to November 2015 but did show amio  Liver depisotis. He was asymptomatic at that time. The pain of starting  amiodarone was in the interim. He tells me now that he continues to be asymptomatic. His wife also agrees with him. He works out at at [employer] doing [employer] and aerobics. Last week he did have a respiratory infection and was down in bed for 1 day but says he is back to baseline. However pulmonary function test documented below shows a decline in forced vital capacity and diffusion capacity. I personally reviewed this result and visualized graph. Walking desaturation test 185 feet 3 laps on room air in our office: lowest pulse ox 98% and asymptomatic HR 121   He is off amio for few months per wife and him  ACCP ILD questionnaire done by his wife on his behlf - cough: mild, occ, day time x months. Denies dyspnea - smoking: started cig at age 49 2 cig/da . Quit after 20 years. REports yes for rec drugs nos - family hx lung disease: denies - home exposure - none - ocupationa: farmm work, Administrator, dairy farm, [employer] - meds: amio hx asbove +   OV 05/25/2016  Chief Complaint  Patient presents with  . Follow-up    pt states he is doing good, he exercises three times a week   Here to discuss ild workup PFT declined sinc June 2017 byut radioloigists feel no change in CT since June 2017 Autoimmune negative He and wife do not want biopsy though indicated    Results for [facility], Alon B. (  MRN [id number]) as of 05/25/2016 11:02  Ref. Range 05/01/2016 10:59 05/01/2016 10:59  Sed Rate Latest Ref Range: 0 - 20 mm/hr 12   Anit Nuclear Antibody(ANA) Latest Ref Range: NEGATIVE  NEG NEG  Angiotensin-Converting Enzyme Latest Ref Range: 9 - 67 U/L 35   Cyclic Citrullin Peptide Ab Latest Units: Units <16 <16  ds DNA Ab Latest Units: IU/mL <1   RA Latex Turbid. Latest Ref Range: <14 IU/mL <14   Ribonucleic Protein(ENA) Antibody, IgG Latest Ref Range: <1.0 NEG AI  <1.0 NEG   SSA (Ro) (ENA) Antibody, IgG Latest Ref Range: <1.0 NEG AI  <1.0 NEG   SSB (La) (ENA) Antibody, IgG Latest Ref  Range: <1.0 NEG AI  <1.0 NEG   Scleroderma (Scl-70) (ENA) Antibody, IgG Latest Ref Range: <1.0 NEG AI  <1.0 NEG    IMPRESSION: 1. 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 2. Aortic atherosclerosis, in addition to left main and 3 vessel coronary artery disease. Status post median sternotomy for CABG. 3. There are calcifications of the aortic valve. Echocardiographic correlation for evaluation of potential valvular dysfunction may be warranted if clinically indicated. 4. The severity of disease appears essentially Unchanged (compareison June 2017(). Minimal cylindrical traction bronchiectasis is noted in the basal segments of the lower lobes of the lungs bilaterally. A small amount of peripheral bronchiolectasis is also noted. No honeycombing identifi  Electronically Signed   By: Vinnie Langton M.D.   On: 05/15/2016 16:33     OV 11/21/2016  Chief Complaint  Patient presents with  . Follow-up    Pt here after PFT. Pt states his breathing is unchanged since last OV. Pt denies cough, SOB, CP/tightness.    85 year old male with undifferentiated interstitial lung disease stable since 2015. Clinically deemed unrelated to amiodarone taken briefly 2017  Last visit was in March 2018. With his wife. He presents again with his wife. In the interim he continues to exercise daily at [employer]. He lifts weights. In fact is able to lift more weights than before. He does not have any dyspnea of exertion. He does not want to have lung biopsy. He is on basic supportive care with observation therapy. He again declined lung biopsy. In fact T-cell immunity does not want to do a 6 month follow-up and wants to keep an open-ended follow-up. His pulmonary function test today stable. Walking  desaturation test resting pulse ox 96% final pulse ox 97% after 185 feet 3 laps on room air. His heart rate jumped from 91/m 130/m.    OV 01/22/2018  Subjective:  Patient ID: [name], male , DOB: 07-Jun-1931 , age 80 y.o. , MRN: [id number] , ADDRESS: [address] [place] [address]   01/22/2018 -   Chief Complaint  Patient presents with  . Follow-up   85 year old male with undifferentiated interstitial lung disease stable since 2015. Clinically deemed unrelated to amiodarone taken briefly 2017  HPI Celso Granja 85 y.o. -presents for one-year follow-up of his ILD not otherwise specified.  I called him clinically undifferentiated interstitial lung disease with negative autoimmune profile although by age and male gender he should be IPF especially with negative serology.  He tells me for the last few months has had increased shortness of breath on exertion particularly with stairs.  He had an admission for heart failure according  to his history and confirmed on review of the chart in October 2019.  After that he is improved according to his wife but he tells me that he is still more short of breath than baseline.  It is relieved by rest.  There is no associated chest pain.  He had pulmonary function test today that shows a decline in lung function compared to one year showing progressive or suggestive of progressive ILD.  He did have a high-resolution CT chest that is read as indeterminate by 2018 ATS criteria.  According to the radiologist there is no progression.   IMPRESSION: 1. Spectrum of findings in the lungs remains compatible with interstitial lung disease, and appears stable compared to the prior examinations, again favored to represent nonspecific interstitial pneumonia (NSIP); technically classified as ATS indeterminate for usual interstitial pneumonia (UIP). 2. Aortic atherosclerosis, in addition to left main and 3 vessel coronary artery disease. Status post median  sternotomy for CABG. 3. There are calcifications of the aortic valve, mitral annulus and mitral subvalvular apparatus. Echocardiographic correlation for evaluation of potential valvular dysfunction may be warranted if clinically indicated.  Aortic Atherosclerosis (ICD10-I70.0).   Electronically Signed   By: Vinnie Langton M.D.   On: 01/03/2018 10:45  ROS - per HPI  OV 04/17/2018  Subjective:  Patient ID: [name], male , DOB: [birth year] , age 85 y.o. , MRN: [id number] , ADDRESS: [address] [place] [address]   85 year old male with undifferentiated interstitial lung disease stable since 2015 - progressed Nov 2019 on PFT (not on CT oct 2019- indetermniate per 2018 ATS criteria). Clinically deemed unrelated to amiodarone taken briefly 2017. IPF clinical dx given 01/22/18    04/17/2018 -   Chief Complaint  Patient presents with  . Follow-up    Pt stated he had problems with the Esbriet to where he became very weak and also lost the appetite and due to the side effects, pt stopped taking it and does not plan on starting back on it. Pt has not taking med in about a month. Pt states his breathing is about the same as last visit and does become SOB with exertion, has an occ cough. Denies any complaints of CP or chest tightness.     HPI [name] 85 y.o. -returns for follow-up of his IPF.  He presents with his wife.  November 2019 I gave him a formal diagnosis of IPF based on clinical grounds and then started him on pirfenidone.  But within a month he lost 14 pounds of weight.  According to his wife at least some of this was due to Lasix.  The weight loss from pirfenidone was due to low appetite.  He also became quite weak and fatigued.  Therefore he does not want to do this drug again.  His last liver function test on  Feb 14 2018 was reviewed and was normal especially the enzymes although bilirubin was 1.4 mg percent.  He has chronic kidney disease with a creatinine  of 2 mg percent and a GFR of 29.  He is currently on anticoagulation.  Because of all this he does not want to do nintedanib at all.  He and his wife wondered about the risk of progression but given the side effect profile and the safety profile they do not want to do any anti-fibrotic's.  However, they are willing to consider research trials but do not have GI side effects or have minimal side effects.  This will be in  contribution to science in the future and he seemed excited about this.  We discussed about 2 potential trials we offer especially in inhaler face to an IV infusion phase 3.  I did agree that we would pre-screen the patient for this in a few months time and discuss this more.       Results for RALSTON, VENUS (MRN [id number]) as of 01/22/2018 11:33  Ref. Range 08/31/2015 12:32 04/02/2016 10:33 11/21/2016 09:53 01/22/2018 10:28  FVC-Pre Latest Units: L 3.24 3.05 3.08 2.55  FVC-%Pred-Pre Latest Units: % 85 80 82 68   Results for JHEREMY, BOGER (MRN [id number]) as of 01/22/2018 11:33    Ref. Range 08/31/2015 12:32 04/02/2016 10:33 11/21/2016 09:53 01/22/2018 10:28  DLCO cor Latest Units: ml/min/mmHg 23.26 20.49 23.33 21.27  DLCO cor % pred Latest Units: % 75 66 75 68    Simple office walk 185 feet x  3 laps goal with forehead probe 04/17/2018   O2 used rooom air  Number laps completed 3  Comments about pace slow  Resting Pulse Ox/HR 100% and 111/min  Final Pulse Ox/HR 98% and 137/min  Desaturated </= 88% no  Desaturated <= 3% points no  Got Tachycardic >/= 90/min Ytes, even at baseline  Symptoms at end of test no  Miscellaneous comments x      ROS - per HPI     has a past medical history of Adenomatous colon polyp, Aortic stenosis, CAD (coronary artery disease), Carotid artery stenosis, Chronic diastolic CHF (congestive heart failure) ([place]), CKD (chronic kidney disease), stage III ([place]), Current use of long term anticoagulation, Diverticulosis, Dyslipidemia, Essential  hypertension, H/O mitral valve repair, Hemorrhoids, Hyperlipidemia, Hypertension, Permanent atrial fibrillation, and TIA (transient ischemic attack).   reports that he quit smoking about 63 years ago. His smoking use included cigarettes. He has a 0.20 pack-year smoking history. He has never used smokeless tobacco.  Past Surgical History:  Procedure Laterality Date  . CORONARY ARTERY BYPASS GRAFT    . MITRAL VALVE REPAIR      Allergies  Allergen Reactions  . Asa [Aspirin] Other (See Comments)    Bleeding- has internal hemorrhoids  . Flomax [Tamsulosin] Other (See Comments)    Made his heart go out of rhythm    Immunization History  Administered Date(s) Administered  . Influenza Split 02/09/2015  . Influenza, High Dose Seasonal PF 12/18/2015, 12/31/2017  . Pneumococcal Conjugate-13 03/20/2015  . Pneumococcal Polysaccharide-23 12/17/2000    Family History  Problem Relation Age of Onset  . Stroke Mother        Deceased  . Heart disease Father        Deceased  . Heart failure Brother        Deceased  . Stroke Brother      Current Outpatient Medications:  .  Cholecalciferol (VITAMIN D-3) 1000 units CAPS, Take by mouth daily. , Disp: , Rfl:  .  furosemide (LASIX) 20 MG tablet, Take 1 table by mouth daily. If weight 185 or greater take 1 extra tablet (20 mg) at noon. If you have to take for 3 days or more, call., Disp: 90 tablet, Rfl: 3 .  levothyroxine (SYNTHROID, LEVOTHROID) 50 MCG tablet, Take 50 mcg by mouth at bedtime. , Disp: , Rfl:  .  metoprolol succinate (TOPROL-XL) 25 MG 24 hr tablet, Take 1.5 tablets (37.5 mg total) by mouth daily., Disp: 45 tablet, Rfl: 5 .  pravastatin (PRAVACHOL) 80 MG tablet, Take 80 mg by mouth 3 (three) times a week.  Mon, Wed, Fri, Disp: , Rfl:  .  amoxicillin (AMOXIL) 500 MG tablet, 500 mg. Take 2000 mg by mouth one hour prior to dental visits, Disp: , Rfl:  .  hydrocortisone (ANUSOL-HC) 2.5 % rectal cream, Place 1 application rectally 2 (two)  times daily. (Patient not taking: Reported on 04/17/2018), Disp: 30 g, Rfl: 1 .  pravastatin (PRAVACHOL) 80 MG tablet, Take 1 tablet (80 mg total) by mouth every evening. (Patient taking differently: Take 80 mg by mouth every Monday, Wednesday, and Friday. ), Disp: 90 tablet, Rfl: 3 .  warfarin (COUMADIN) 4 MG tablet, Take 4 mg by mouth every evening., Disp: , Rfl:       Objective:   Vitals:   04/17/18 0858  BP: 118/64  Pulse: (!) 111  SpO2: 100%  Weight: 187 lb (84.8 kg)  Height: 5\' 8"  (1.727 m)    Estimated body mass index is 28.43 kg/m as calculated from the following:   Height as of this encounter: 5\' 8"  (1.727 m).   Weight as of this encounter: 187 lb (84.8 kg).  @WEIGHTCHANGE @  [employer]   04/17/18 0858  Weight: 187 lb (84.8 kg)     Physical Exam  General Appearance:    Alert, cooperative, no distress, appears stated age - yes , Deconditioned looking - no , OBESE  - no, lost weight, Sitting on Wheelchair -  no  Head:    Normocephalic, without obvious abnormality, atraumatic  Eyes:    PERRL, conjunctiva/corneas clear,  Ears:    Normal TM's and external ear canals, both ears  Nose:   Nares normal, septum midline, mucosa normal, no drainage    or sinus tenderness. OXYGEN ON  - no . Patient is @ ra   Throat:   Lips, mucosa, and tongue normal; teeth and gums normal. Cyanosis on lips - no  Neck:   Supple, symmetrical, trachea midline, no adenopathy;    thyroid:  no enlargement/tenderness/nodules; no carotid   bruit or JVD  Back:     Symmetric, no curvature, ROM normal, no CVA tenderness  Lungs:     Distress - no , Wheeze no, Barrell Chest - no, Purse lip breathing - no, Crackles -faint at the lung base  Chest Wall:    No tenderness or deformity.    Heart:    Regular rate and rhythm, S1 and S2 normal, no rub   or gallop, Murmur - no  Breast Exam:    NOT DONE  Abdomen:     Soft, non-tender, bowel sounds active all four quadrants,    no masses, no organomegaly.  Visceral obesity - less  Genitalia:   NOT DONE  Rectal:   NOT DONE  Extremities:   Extremities - normal, Has Cane - no, Clubbing - no, Edema - no  Pulses:   2+ and symmetric all extremities  Skin:   Stigmata of Connective Tissue Disease - no  Lymph nodes:   Cervical, supraclavicular, and axillary nodes normal  Psychiatric:  Neurologic:   Pleasant - yes, Anxious - no, Flat affect - no  CAm-ICU - neg, Alert and Oriented x 3 - yes, Moves all 4s - yes, Speech - normal, Cognition - intact           Assessment:       ICD-10-CM   1. IPF (idiopathic pulmonary fibrosis) ([place]) W29.937        Plan:     Patient Instructions     ICD-10-CM   1. IPF (idiopathic pulmonary  fibrosis) ([place]) J84.112      Compared to last visit the IPF appears stable With significant weight loss and low appetite because of Esbriet Glad you are feeling better without esbriet  PLAN - do lft blood work 04/17/2018  -CMA will mark Esbriet as an allergy -We discussed several options progressive disease potential progression for the future  -You declined nintedanib the other drug because of ongoing anticoagulation and similar risk of weight loss and low appetite and diarrhea.  I support this decision  -For now we will continue an expectant approach with basic supportive care fully understanding that disease is at risk for future progression  -We discussed clinical trial options of an inhaler study (Galecto) and infusion study (Fibrogen).  I appreciate you and your wife being interested in these research options and we can definitely address this in the future   -Visit our research website [email or web address]  Followup  -Spirometry and DLCO in 3 months -Return to interstitial lung disease clinic in 3 months    > 50% of this > 25 min visit spent in face to face counseling or coordination of care - by this undersigned MD - Dr Brand Males. This includes one or more of the following documented above: discussion  of test results, diagnostic or treatment recommendations, prognosis, risks and benefits of management options, instructions, education, compliance or risk-factor reduction   SIGNATURE    Dr. Brand Males, M.D., F.C.C.P,  Pulmonary and Critical Care Medicine Staff Physician, Power Director - Interstitial Lung Disease  Program  [facility] [place], [place], [address]  Pager: [phone], If no answer or between  15:00h - 7:00h: call [phone] Telephone: [phone]  9:37 AM 04/17/2018

## 2018-04-17 NOTE — Addendum Note (Signed)
Addended by: Joella Prince on: 04/17/2018 09:41 AM   Modules accepted: Orders

## 2018-04-17 NOTE — Patient Instructions (Addendum)
ICD-10-CM   1. IPF (idiopathic pulmonary fibrosis) ([place]) J84.112      Compared to last visit the IPF appears stable With significant weight loss and low appetite because of Esbriet Glad you are feeling better without esbriet  PLAN - do lft blood work 04/17/2018  -CMA will mark Esbriet as an allergy -We discussed several options progressive disease potential progression for the future  -You declined nintedanib the other drug because of ongoing anticoagulation and similar risk of weight loss and low appetite and diarrhea.  I support this decision  -For now we will continue an expectant approach with basic supportive care fully understanding that disease is at risk for future progression  -We discussed clinical trial options of an inhaler study (Galecto) and infusion study (Fibrogen).  I appreciate you and your wife being interested in these research options and we can definitely address this in the future   -Visit our research website [email or web address]  Followup  -Spirometry and DLCO in 3 months -Return to interstitial lung disease clinic in 3 months

## 2018-04-22 DIAGNOSIS — I2510 Atherosclerotic heart disease of native coronary artery without angina pectoris: Secondary | ICD-10-CM | POA: Diagnosis not present

## 2018-04-22 DIAGNOSIS — J8489 Other specified interstitial pulmonary diseases: Secondary | ICD-10-CM | POA: Diagnosis not present

## 2018-04-22 DIAGNOSIS — E7800 Pure hypercholesterolemia, unspecified: Secondary | ICD-10-CM | POA: Diagnosis not present

## 2018-04-22 DIAGNOSIS — I350 Nonrheumatic aortic (valve) stenosis: Secondary | ICD-10-CM | POA: Diagnosis not present

## 2018-04-22 DIAGNOSIS — Z0000 Encounter for general adult medical examination without abnormal findings: Secondary | ICD-10-CM | POA: Diagnosis not present

## 2018-04-22 DIAGNOSIS — I5022 Chronic systolic (congestive) heart failure: Secondary | ICD-10-CM | POA: Diagnosis not present

## 2018-04-22 DIAGNOSIS — I480 Paroxysmal atrial fibrillation: Secondary | ICD-10-CM | POA: Diagnosis not present

## 2018-04-22 DIAGNOSIS — I10 Essential (primary) hypertension: Secondary | ICD-10-CM | POA: Diagnosis not present

## 2018-04-22 DIAGNOSIS — N183 Chronic kidney disease, stage 3 (moderate): Secondary | ICD-10-CM | POA: Diagnosis not present

## 2018-04-22 DIAGNOSIS — E039 Hypothyroidism, unspecified: Secondary | ICD-10-CM | POA: Diagnosis not present

## 2018-05-26 ENCOUNTER — Telehealth: Payer: Self-pay | Admitting: Cardiology

## 2018-05-26 DIAGNOSIS — I5042 Chronic combined systolic (congestive) and diastolic (congestive) heart failure: Secondary | ICD-10-CM

## 2018-05-26 DIAGNOSIS — I4891 Unspecified atrial fibrillation: Secondary | ICD-10-CM

## 2018-05-26 NOTE — Telephone Encounter (Signed)
New Message   Pt c/o medication issue:  1. Name of Medication: metoprolol 25mg   2. How are you currently taking this medication (dosage and times per day)? 1 and a half daily   3. Are you having a reaction (difficulty breathing--STAT)? no  4. What is your medication issue? Patient has been taking two pills daily in stead of the one and a half like instructed and it's been working better so patient wants to know if it's ok to continue taking two.

## 2018-05-27 MED ORDER — METOPROLOL SUCCINATE ER 50 MG PO TB24: 50.0000 mg | ORAL_TABLET | Freq: Every day | ORAL | 3 refills | Status: DC

## 2018-05-27 NOTE — Telephone Encounter (Signed)
Wife updated with pt recommendations. New Rx sent to pharmacy.

## 2018-05-27 NOTE — Addendum Note (Signed)
Addended by: Meryl Crutch on: 05/27/2018 08:20 AM   Modules accepted: Orders

## 2018-05-27 NOTE — Telephone Encounter (Signed)
If he is tolerating two pills (50 mg total) without low pressures, I would like to keep the higher dose (continue the two pills). We can send this in for him, or I know he gets some of his medications through the [place]. Whichever is easiest for him. Thanks!

## 2018-05-29 ENCOUNTER — Ambulatory Visit (HOSPITAL_BASED_OUTPATIENT_CLINIC_OR_DEPARTMENT_OTHER): Payer: PPO | Attending: Cardiology | Admitting: Cardiovascular Disease

## 2018-05-29 VITALS — Ht 68.0 in | Wt 174.0 lb

## 2018-05-29 DIAGNOSIS — G4733 Obstructive sleep apnea (adult) (pediatric): Secondary | ICD-10-CM | POA: Diagnosis not present

## 2018-05-29 DIAGNOSIS — Z7901 Long term (current) use of anticoagulants: Secondary | ICD-10-CM | POA: Insufficient documentation

## 2018-05-29 DIAGNOSIS — I4891 Unspecified atrial fibrillation: Secondary | ICD-10-CM | POA: Diagnosis not present

## 2018-05-29 DIAGNOSIS — R063 Periodic breathing: Secondary | ICD-10-CM

## 2018-05-29 DIAGNOSIS — I493 Ventricular premature depolarization: Secondary | ICD-10-CM | POA: Diagnosis not present

## 2018-05-29 DIAGNOSIS — G4730 Sleep apnea, unspecified: Secondary | ICD-10-CM

## 2018-05-29 DIAGNOSIS — Z79899 Other long term (current) drug therapy: Secondary | ICD-10-CM | POA: Diagnosis not present

## 2018-05-29 DIAGNOSIS — G4731 Primary central sleep apnea: Secondary | ICD-10-CM | POA: Diagnosis not present

## 2018-05-30 ENCOUNTER — Other Ambulatory Visit: Payer: Self-pay

## 2018-06-01 ENCOUNTER — Encounter (HOSPITAL_BASED_OUTPATIENT_CLINIC_OR_DEPARTMENT_OTHER): Payer: Self-pay | Admitting: Cardiovascular Disease

## 2018-06-01 NOTE — Procedures (Signed)
Patient Name: [name], [name] Date: 05/29/2018 Gender: Male D.O.B: 1931/11/22 Age (years): 89 Referring Provider: Buford Dresser Height (inches): 68 Interpreting Physician: Shelva Majestic MD, ABSM Weight (lbs): 174 RPSGT: Baxter Flattery BMI: 26 MRN: [id number] Neck Size: 16.00  CLINICAL INFORMATION The patient is referred for a split night study with BPAP.  MEDICATIONS     amoxicillin (AMOXIL) 500 MG tablet             Cholecalciferol (VITAMIN D-3) 1000 units CAPS         furosemide (LASIX) 20 MG tablet         hydrocortisone (ANUSOL-HC) 2.5 % rectal cream         levothyroxine (SYNTHROID, LEVOTHROID) 50 MCG tablet         metoprolol succinate (TOPROL-XL) 50 MG 24 hr tablet         pravastatin (PRAVACHOL) 80 MG tablet (Expired)         pravastatin (PRAVACHOL) 80 MG tablet         warfarin (COUMADIN) 4 MG tablet      Medications self-administered by patient taken the night of the study : N/A  SLEEP STUDY TECHNIQUE As per the AASM Manual for the Scoring of Sleep and Associated Events v2.3 (April 2016) with a hypopnea requiring 4% desaturations.  The channels recorded and monitored were frontal, central and occipital EEG, electrooculogram (EOG), submentalis EMG (chin), nasal and oral airflow, thoracic and abdominal wall motion, anterior tibialis EMG, snore microphone, electrocardiogram, and pulse oximetry. Bi-level positive airway pressure (BiPAP) was initiated when the patient met split night criteria and was titrated according to treat sleep-disordered breathing.  RESPIRATORY PARAMETERS Diagnostic Total AHI (/hr): 49.4 RDI (/hr): 49.4 OA Index (/hr): - CA Index (/hr): 48.4 REM AHI (/hr): N/A NREM AHI (/hr): 49.4 Supine AHI (/hr): 56.8 Non-supine AHI (/hr): 48.53 Min O2 Sat (%): 80.0 Mean O2 (%): 91.5 Time below 88% (min): 49.5   Titration Optimal IPAP Pressure (cm): 18 Optimal EPAP Pressure (cm): 14 AHI at Optimal Pressure (/hr): 12.5 Min O2 at Optimal  Pressure (%): 92.0 Sleep % at Optimal (%): 100 Supine % at Optimal (%): 100   SLEEP ARCHITECTURE The study was initiated at 9:59:41 PM and terminated at 4:53:12 AM. The total recorded time was 413.5 minutes. EEG confirmed total sleep time was 251.8 minutes yielding a sleep efficiency of 60.9%%. Sleep onset after lights out was 0.2 minutes with a REM latency of N/A minutes. The patient spent 1.4%% of the night in stage N1 sleep, 98.6%% in stage N2 sleep, 0.0%% in stage N3 and 0% in REM. Wake after sleep onset (WASO) was 161.5 minutes. The Arousal Index was 1.0/hour.  LEG MOVEMENT DATA The total Periodic Limb Movements of Sleep (PLMS) were 0. The PLMS index was 0.0 .  CARDIAC DATA The 2 lead EKG demonstrated sinus rhythm. The mean heart rate was 100.0 beats per minute. Other EKG findings include: Atrial Fibrillation, PVCs.  IMPRESSIONS - Severe obstructive sleep apnea occurred during the diagnostic portion of the study (AHI 49.4 /hour). There is a Cheynes -Stokes respiration breathing pattern throughout the study.  - Severe central sleep apnea occurred during the diagnostic portion of the study (CAI 48.4/hour). - Moderate oxygen desaturation was noted during the diagnostic portion of the study to a nadir of  80.0%. - No snoring was audible during this study. - EKG findings include Atrial Fibrillation, PVCs. - Clinically significant periodic limb movements of sleep did not occur during the study.  DIAGNOSIS - Obstructive Sleep Apnea (327.23 [G47.33 ICD-10]) - Central Sleep Apnea  RECOMMENDATIONS - In this patient with severe LV dysfunction (EF 20 - 25%), at present he is not a candidate for ASV titration based on old studies with old ASV technology; however, await upcoming data with the new ASV modalities.  With severe central apnea recommend an initial trial of BiPAP Auto ST with EPAP min of 14, IPAP max of 25, PS of 4 and and default breath rate of 12 with heated humidification. A Medium size  [employer] Full Face Mask Simplus mask was used for the titration. - With Cheyne-Stokes respiration pattern, recommend optimization of volume status and diuresis. - Effort should be made to optimize nasal and oropharyngeal patency.  - Avoid alcohol, sedatives and other CNS depressants that may worsen sleep apnea and disrupt normal sleep architecture. - Sleep hygiene should be reviewed to assess factors that may improve sleep quality. - Weight management and regular exercise should be initiated or continued. - Recommend a download in 30 days and sleep clinic evaluation after 4 weeks of therapy.   [Electronically signed] 06/01/2018 12:10 PM  Shelva Majestic MD, [facility], [place], American Board of Sleep Medicine   NPI: [id number] [place] PH: [phone]   FX: [phone] [place]

## 2018-06-06 ENCOUNTER — Ambulatory Visit (HOSPITAL_BASED_OUTPATIENT_CLINIC_OR_DEPARTMENT_OTHER): Payer: PPO | Attending: Cardiovascular Disease | Admitting: Cardiovascular Disease

## 2018-06-06 ENCOUNTER — Other Ambulatory Visit: Payer: Self-pay | Admitting: Cardiovascular Disease

## 2018-06-06 VITALS — Ht 68.0 in | Wt 173.0 lb

## 2018-06-06 DIAGNOSIS — G4733 Obstructive sleep apnea (adult) (pediatric): Secondary | ICD-10-CM | POA: Diagnosis not present

## 2018-06-06 DIAGNOSIS — G4731 Primary central sleep apnea: Secondary | ICD-10-CM | POA: Insufficient documentation

## 2018-06-06 NOTE — Progress Notes (Signed)
Both patient and wife notified of sleep study results and recommendations. BIPAP/ST appointment scheduled for tonight.

## 2018-06-09 ENCOUNTER — Other Ambulatory Visit: Payer: Self-pay

## 2018-06-09 NOTE — Procedures (Deleted)
       NAME: [name] DATE OF BIRTH:  11-23-1931 MEDICAL RECORD NUMBER [id number]  [facility]  PHYSICIAN: Shelva Majestic  DATE OF STUDY: 06/06/2018  SLEEP STUDY TYPE: Out of Center Sleep Test                REFERRING PHYSICIAN: Troy Sine, MD  INDICATION FOR STUDY: ***  EPWORTH SLEEPINESS SCORE:   HEIGHT: 5\' 8"  (172.7 cm)  WEIGHT: 173 lb (78.5 kg)    Body mass index is 26.3 kg/m.  NECK SIZE: 16 in.  MEDICATIONS: ***  IMPRESSION:  ***    RECOMMENDATION:  ***   [place], American Board of Sleep Medicine  ELECTRONICALLY SIGNED ON:  06/09/2018, 2:06 PM [place] PH: [phone]   FX: [phone] [place]

## 2018-06-10 ENCOUNTER — Other Ambulatory Visit: Payer: Self-pay | Admitting: Cardiovascular Disease

## 2018-06-10 DIAGNOSIS — G4733 Obstructive sleep apnea (adult) (pediatric): Secondary | ICD-10-CM

## 2018-06-10 DIAGNOSIS — G4731 Primary central sleep apnea: Secondary | ICD-10-CM

## 2018-06-12 NOTE — Procedures (Signed)
     Patient Name: [name], [name] Date: 06/06/2018 Gender: Male D.O.B: 10-22-1931 Age (years): 77 Referring Provider: Shelva Majestic MD, ABSM Height (inches): 68 Interpreting Physician: Shelva Majestic MD, ABSM Weight (lbs): 174 RPSGT: Zadie Rhine BMI: 26 MRN: [id number] Neck Size: 16.00  CLINICAL INFORMATION The patient is referred for a BiPAP titration to treat sleep apnea.  Date of Split Night:  05/29/2018  SLEEP STUDY TECHNIQUE As per the AASM Manual for the Scoring of Sleep and Associated Events v2.3 (April 2016) with a hypopnea requiring 4% desaturations.  The channels recorded and monitored were frontal, central and occipital EEG, electrooculogram (EOG), submentalis EMG (chin), nasal and oral airflow, thoracic and abdominal wall motion, anterior tibialis EMG, snore microphone, electrocardiogram, and pulse oximetry. Bilevel positive airway pressure (BPAP) was initiated at the beginning of the study and titrated to treat sleep-disordered breathing.  MEDICATIONS Medications self-administered by patient taken the night of the study : DIPHENHYDRAMINE  RESPIRATORY PARAMETERS Optimal IPAP Pressure (cm): 22 AHI at Optimal Pressure (/hr) 5.0 Optimal EPAP Pressure (cm): 18   Overall Minimal O2 (%): 82.00 Minimal O2 at Optimal Pressure (%): 91.0  SLEEP ARCHITECTURE Start Time: 9:25:44 PM Stop Time: 4:59:56 AM Total Time (min): 454.2 Total Sleep Time (min): 396.3 Sleep Latency (min): 0.4 Sleep Efficiency (%): 87.2 REM Latency (min): 405.5 WASO (min): 57.5 Stage N1 (%): 24.17 Stage N2 (%): 74.94 Stage N3 (%): 0.63 Stage R (%): 0.3 Supine (%): 43.71 Arousal Index (/hr): 47.7   CARDIAC DATA The 2 lead EKG demonstrated sinus rhythm. The mean heart rate was 106.08 beats per minute. Other EKG findings include: None.  LEG MOVEMENT DATA The total Periodic Limb Movements of Sleep (PLMS) were 7. The PLMS index was 1.06. A PLMS index of <15 is considered normal in adults.   IMPRESSIONS - An optimal PAP pressure was selected for this patient ( 22 / cm of water) - Mild Central Sleep Apnea was noted during this titration (CAI = 13.6/h). - Moderete oxygen desaturations were observed during this titration (min O2 = 82.00%). - No snoring was audible during this study. - No cardiac abnormalities were observed during this study. - Clinically significant periodic limb movements were not noted during this study. Arousals associated with PLMs were rare.  DIAGNOSIS - Obstructive Sleep Apnea (327.23 [G47.33 ICD-10]) - Central sleep apnea  RECOMMENDATIONS - This study was ordered as a BiPAP ST evaluation due to the significant central sleep apnea and Cheyne Stokes respiration noted on the 05/29/2018 study.  The patient has significant LV dysfunction and at present is therefore not a candidate for ASV titration.   Unfortunately the ST was never activated on this study and the study was similar to the prior BiPAP titraton.  Due to the erroneous study, this will not be charged and the patient will be rescheduled for the BiPAP ST titration   [Electronically signed] 06/12/2018 03:06 PM  Shelva Majestic MD, [place], American Board of Sleep Medicine   NPI: [id number] [place] PH: [phone]   FX: [phone] [place]

## 2018-06-16 ENCOUNTER — Ambulatory Visit (HOSPITAL_BASED_OUTPATIENT_CLINIC_OR_DEPARTMENT_OTHER): Payer: PPO | Attending: Cardiovascular Disease | Admitting: Cardiovascular Disease

## 2018-06-16 ENCOUNTER — Other Ambulatory Visit: Payer: Self-pay

## 2018-06-16 VITALS — Ht 69.0 in | Wt 172.0 lb

## 2018-06-16 DIAGNOSIS — G4733 Obstructive sleep apnea (adult) (pediatric): Secondary | ICD-10-CM | POA: Diagnosis not present

## 2018-06-16 DIAGNOSIS — G4731 Primary central sleep apnea: Secondary | ICD-10-CM

## 2018-06-19 ENCOUNTER — Telehealth: Payer: Self-pay | Admitting: *Deleted

## 2018-06-19 ENCOUNTER — Encounter (HOSPITAL_BASED_OUTPATIENT_CLINIC_OR_DEPARTMENT_OTHER): Payer: Self-pay | Admitting: Cardiovascular Disease

## 2018-06-19 NOTE — Telephone Encounter (Signed)
Patient's wife notified sleep study completed. BIPAP referral will be sent to [facility]. If they don't hear from them I 1 week they are to call me so I can check the status.

## 2018-06-19 NOTE — Procedures (Signed)
Patient Name: [name], [name] Date: 06/16/2018 Gender: Male D.O.B: 02-Aug-1931 Age (years): 76 Referring Provider: Shelva Majestic MD, ABSM Height (inches): 69 Interpreting Physician: Shelva Majestic MD, ABSM Weight (lbs): 172 RPSGT: Carolin Coy BMI: 25 MRN: [id number] Neck Size: 16.00  CLINICAL INFORMATION The patient is referred for a BiPAP ST titration to treat sleep apnea.  Date of Split Night: 05/29/2018:  AHI 49.4/h; Central AHI 48.4 with Cheyne-Stokes respiration.  With LV dysfunction patient currently is not a candidate for ASV titration and now is referfred for BiPAP ST titration.  SLEEP STUDY TECHNIQUE As per the AASM Manual for the Scoring of Sleep and Associated Events v2.3 (April 2016) with a hypopnea requiring 4% desaturations.  The channels recorded and monitored were frontal, central and occipital EEG, electrooculogram (EOG), submentalis EMG (chin), nasal and oral airflow, thoracic and abdominal wall motion, anterior tibialis EMG, snore microphone, electrocardiogram, and pulse oximetry. Bilevel positive airway pressure (BPAP) was initiated at the beginning of the study and titrated to treat sleep-disordered breathing.  MEDICATIONS     amoxicillin (AMOXIL) 500 MG tablet             Cholecalciferol (VITAMIN D-3) 1000 units CAPS         furosemide (LASIX) 20 MG tablet         hydrocortisone (ANUSOL-HC) 2.5 % rectal cream         levothyroxine (SYNTHROID, LEVOTHROID) 50 MCG tablet         metoprolol succinate (TOPROL-XL) 50 MG 24 hr tablet         pravastatin (PRAVACHOL) 80 MG tablet (Expired)         pravastatin (PRAVACHOL) 80 MG tablet         warfarin (COUMADIN) 4 MG tablet      Medications self-administered by patient taken the night of the study : DIPHENHYDRAMINE  RESPIRATORY PARAMETERS Optimal IPAP Pressure (cm): 27 AHI at Optimal Pressure (/hr) 29.0 Optimal EPAP Pressure (cm): 22   Overall Minimal O2 (%): 83.0 Minimal O2 at Optimal Pressure (%):  89.0  SLEEP ARCHITECTURE Start Time: 9:08:22 PM Stop Time: 4:40:18 AM Total Time (min): 451.9 Total Sleep Time (min): 425 Sleep Latency (min): 0.2 Sleep Efficiency (%): 94.0% REM Latency (min): 59.5 WASO (min): 26.7 Stage N1 (%): 10.7% Stage N2 (%): 88.0% Stage N3 (%): 0.0% Stage R (%): 1.3 Supine (%): 27.50 Arousal Index (/hr): 21.0   CARDIAC DATA The 2 lead EKG demonstrated atrial fibrillation. The mean heart rate was 110.5 beats per minute. Other EKG findings include: PVCs.  LEG MOVEMENT DATA The total Periodic Limb Movements of Sleep (PLMS) were 0. The PLMS index was 0.0. A PLMS index of <15 is considered normal in adults.  IMPRESSIONS - BiPAP ST was initiated at 8/4 with a back up rate of 18 and with therapy settings of TI max 1.5, TI min 0.8, trigger at high and cycle at low.  He was titrate to 27/22 cm water with a back up rate of 22. There was resolution af any central events at 22/18. AHI at 27/22 was 26.3.  - Central sleep apnea was not noted during this titration (CAI 1.4/h) and markedly improved from the initial BiPAP titration. - Moderete oxygen desaturations were observed during this titration to a nadir of 83% at 12/8. - The patient snored with soft snoring volume; with resolution of snoring at 27/22. - 2-lead EKG demonstrated: Atrial fibrillation with PVCs - Clinically significant periodic limb movements were not noted during this study.  Arousals associated with PLMs were rare.  DIAGNOSIS - Obstructive Sleep Apnea (327.23 [G47.33 ICD-10])  RECOMMENDATIONS - Recommend an initial trial of BiPAP Auto therapy with EPAP min of 20, PS of 5, and IPAP max of 30 with a back-up RR of 22 with heated humidification. A medium size Fisher[employer]Paykel Full Face Mask Simplus mask was used for this titration. - Eforts should be made to optimize nasal and oropharyngeal patency. - With previous documentation of cheyne-stokes respiration on the initial study, optimization of volume status is  recommended. - Avoid alcohol, sedatives and other CNS depressants that may worsen sleep apnea and disrupt normal sleep architecture. - Sleep hygiene should be reviewed to assess factors that may improve sleep quality. - Weight management and regular exercise should be initiated or continued. - Recommend a download in 30 days and sleep clinic evaluation after 4 weeks of therapy  [Electronically signed] 06/19/2018 10:23 AM  Shelva Majestic MD, [facility], ABSM Diplomate, American Board of Sleep Medicine   NPI: [id number] [place] PH: [phone]   FX: [phone] [place]

## 2018-06-24 ENCOUNTER — Telehealth: Payer: Self-pay | Admitting: Cardiology

## 2018-06-24 NOTE — Telephone Encounter (Signed)
Called patient, LVM advising of more information regarding the difficulty breathing.   Left call back number.

## 2018-06-24 NOTE — Telephone Encounter (Signed)
Patient's wife returned call.  

## 2018-06-24 NOTE — Telephone Encounter (Signed)
New Message   Patients wife is calling on behalf her spouse. She has several things that she would like to discuss.  1. He takes Lasix during the day but at night he has a hard time breathing so she wants to know can the lasix be increased.  2. Also do you know when they will be able to get the CPAP machine because she thinks he needs it.  3. She also wants to know can he get an inhaler to help him breath at night.    Please call to discuss.

## 2018-06-24 NOTE — Telephone Encounter (Signed)
Called patient, spoke with wife.  She states that patient breaths fine during the day, but at nighttime he does not sleep well at all, because he has difficulty breathing.  She states he may need an inhaler and would like to hear from Dr. Harrell Gave regarding this, she also wants her to know patient is now only taking one metoprolol as BP is down to a normal level, his weight is also at 172lbs and maintaining.   I will route a message to [place] regarding machine- I did advised patient that once he receives that machine it may help him sleep better and breath better.   Patient states they have not heard yet regarding any information on that machine.

## 2018-06-25 NOTE — Telephone Encounter (Signed)
I would start with the breathing machine at night to see if that helps. Inhalers like albuterol tend to only be short acting, so it would be better to get the machine to keep him treated all night long. Glad to hear that his weight and blood pressure is stable. How is his heart rate doing? We have a virtual visit coming up on 4/24, and I look forward to getting more details then. Thanks!

## 2018-06-25 NOTE — Telephone Encounter (Signed)
Pt's wife updated with Dr. Judeth Cornfield recommendation. Wife voiced understanding and HR listed below. 4/1-124 4/2-129 4/3-123 4/4-104 4/5-108 4/6-101 4/7-108 4/8-118  Wife reports she has been giving pt 20 mg of lasix BID. She state pt seems to feel better and have a little more energy. She report, BP has been within normal range and only dropped below 100 when she gave extra dose of metoprolol. Wife also state, pt's [place] provider wanted them to consult with Dr. Harrell Gave, to see if pt could be switched to Eliquis vs Coumadin. She report he is having to have his levels drawn more frequent, with each time either being too high or too low. Will route to MD

## 2018-06-27 NOTE — Telephone Encounter (Signed)
Thank you for all of the information. We will review this all on 4/24.

## 2018-06-30 NOTE — Telephone Encounter (Signed)
Wife states for the past week, she has been giving pt 20 mg of lasix BID and he feels much better with more energy. She is requesting if MD is ok with med change.   Message routed to MD.

## 2018-07-01 NOTE — Telephone Encounter (Signed)
That medication regimen is ok for now, but we will need to review all the medications, vital signs, recent labs, etc when we follow up via phone or video. Thank you.

## 2018-07-02 ENCOUNTER — Telehealth: Payer: Self-pay | Admitting: Cardiology

## 2018-07-02 NOTE — Telephone Encounter (Signed)
Wife updated and voiced understanding.

## 2018-07-02 NOTE — Telephone Encounter (Signed)
Follow Up:    Returning a call from today, but does not know who called.

## 2018-07-02 NOTE — Telephone Encounter (Signed)
Spoke with pt's wife and advised her that we had already spoken this morning. Wife states she thought someone had called her again.

## 2018-07-04 ENCOUNTER — Telehealth: Payer: Self-pay | Admitting: Cardiology

## 2018-07-04 NOTE — Telephone Encounter (Signed)
New Message           Patient returned the call, would like a call back.

## 2018-07-04 NOTE — Telephone Encounter (Signed)
Virtual Visit Pre-Appointment Phone Call  Steps For Call:  1. Confirm consent - "In the setting of the current Covid19 crisis, you are scheduled for a (phone or video) visit with your provider on (date) at (time).  Just as we do with many in-office visits, in order for you to participate in this visit, we must obtain consent.  If you'd like, I can send this to your mychart (if signed up) or email for you to review.  Otherwise, I can obtain your verbal consent now.  All virtual visits are billed to your insurance company just like a normal visit would be.  By agreeing to a virtual visit, we'd like you to understand that the technology does not allow for your provider to perform an examination, and thus may limit your provider's ability to fully assess your condition.  Finally, though the technology is pretty good, we cannot assure that it will always work on either your or our end, and in the setting of a video visit, we may have to convert it to a phone-only visit.  In either situation, we cannot ensure that we have a secure connection.  Are you willing to proceed?" STAFF: Did the patient verbally acknowledge consent to telehealth visit? Document YES/NO here: Yes  2. Confirm the BEST phone number to call the day of the visit by including in appointment notes  3. Give patient instructions for WebEx/MyChart download to smartphone as below or Doximity/Doxy.me if video visit (depending on what platform provider is using)  4. Advise patient to be prepared with their blood pressure, heart rate, weight, any heart rhythm information, their current medicines, and a piece of paper and pen handy for any instructions they may receive the day of their visit  5. Inform patient they will receive a phone call 15 minutes prior to their appointment time (may be from unknown caller ID) so they should be prepared to answer  6. Confirm that appointment type is correct in Epic appointment notes (VIDEO vs PHONE)      TELEPHONE CALL NOTE  [name] has been deemed a candidate for a follow-up tele-health visit to limit community exposure during the Covid-19 pandemic. I spoke with the patient via phone to ensure availability of phone/video source, confirm preferred email & phone number, and discuss instructions and expectations.  I reminded [name] to be prepared with any vital sign and/or heart rhythm information that could potentially be obtained via home monitoring, at the time of his visit. I reminded [name] to expect a phone call at the time of his visit if his visit.  Meryl Crutch, RN 07/04/2018 10:15 AM   INSTRUCTIONS FOR DOWNLOADING THE WEBEX APP TO SMARTPHONE  - If Apple, ask patient to go to App Store and type in WebEx in the search bar. [place] [employer], the blue/green circle. If Android, go to [employer] and type in [employer] in the search bar. The app is free but as with any other app downloads, their phone may require them to verify saved payment information or Apple/Android password.  - The patient does NOT have to create an account. - On the day of the visit, the assist will walk the patient through joining the meeting with the meeting number/password.  INSTRUCTIONS FOR DOWNLOADING THE MYCHART APP TO SMARTPHONE  - The patient must first make sure to have activated MyChart and know their login information - If Apple, go to [employer] and type  in [place] in the search bar and download the app. If Android, ask patient to go to [employer] and type in [place] in the search bar and download the app. The app is free but as with any other app downloads, their phone may require them to verify saved payment information or Apple/Android password.  - The patient will need to then log into the app with their MyChart username and password, and select [facility] as their healthcare provider to link the account. When it is time for your visit, go to the MyChart app,  find appointments, and click Begin Video Visit. Be sure to Select Allow for your device to access the Microphone and Camera for your visit. You will then be connected, and your provider will be with you shortly.  **If they have any issues connecting, or need assistance please contact MyChart service desk [phone])**  **If using a computer, in order to ensure the best quality for their visit they will need to use either of the following Internet Browsers: [employer], or Google Chrome**  IF USING DOXIMITY or DOXY.ME - The patient will receive a link just prior to their visit, either by text or email (to be determined day of appointment depending on if it's doxy.me or Doximity).     FULL LENGTH CONSENT FOR TELE-HEALTH VISIT   I hereby voluntarily request, consent and authorize [facility] and its employed or contracted physicians, physician assistants, nurse practitioners or other licensed health care professionals (the Practitioner), to provide me with telemedicine health care services (the "Services") as deemed necessary by the treating Practitioner. I acknowledge and consent to receive the Services by the Practitioner via telemedicine. I understand that the telemedicine visit will involve communicating with the Practitioner through live audiovisual communication technology and the disclosure of certain medical information by electronic transmission. I acknowledge that I have been given the opportunity to request an in-person assessment or other available alternative prior to the telemedicine visit and am voluntarily participating in the telemedicine visit.  I understand that I have the right to withhold or withdraw my consent to the use of telemedicine in the course of my care at any time, without affecting my right to future care or treatment, and that the Practitioner or I may terminate the telemedicine visit at any time. I understand that I have the right to inspect all  information obtained and/or recorded in the course of the telemedicine visit and may receive copies of available information for a reasonable fee.  I understand that some of the potential risks of receiving the Services via telemedicine include:  Marland Kitchen Delay or interruption in medical evaluation due to technological equipment failure or disruption; . Information transmitted may not be sufficient (e.g. poor resolution of images) to allow for appropriate medical decision making by the Practitioner; and/or  . In rare instances, security protocols could fail, causing a breach of personal health information.  Furthermore, I acknowledge that it is my responsibility to provide information about my medical history, conditions and care that is complete and accurate to the best of my ability. I acknowledge that Practitioner's advice, recommendations, and/or decision may be based on factors not within their control, such as incomplete or inaccurate data provided by me or distortions of diagnostic images or specimens that may result from electronic transmissions. I understand that the practice of medicine is not an exact science and that Practitioner makes no warranties or guarantees regarding treatment outcomes. I acknowledge that I will receive a  copy of this consent concurrently upon execution via email to the email address I last provided but may also request a printed copy by calling the office of [facility].    I understand that my insurance will be billed for this visit.   I have read or had this consent read to me. . I understand the contents of this consent, which adequately explains the benefits and risks of the Services being provided via telemedicine.  . I have been provided ample opportunity to ask questions regarding this consent and the Services and have had my questions answered to my satisfaction. . I give my informed consent for the services to be provided through the use of telemedicine in my  medical care  By participating in this telemedicine visit I agree to the above.

## 2018-07-04 NOTE — Telephone Encounter (Signed)
Mychart pending, smartphone, pre reg complete 07/04/18 AF

## 2018-07-07 ENCOUNTER — Encounter: Payer: Self-pay | Admitting: Cardiology

## 2018-07-07 ENCOUNTER — Telehealth (INDEPENDENT_AMBULATORY_CARE_PROVIDER_SITE_OTHER): Payer: PPO | Admitting: Cardiology

## 2018-07-07 VITALS — BP 98/66 | HR 96 | Ht 68.0 in | Wt 172.0 lb

## 2018-07-07 DIAGNOSIS — I2510 Atherosclerotic heart disease of native coronary artery without angina pectoris: Secondary | ICD-10-CM | POA: Diagnosis not present

## 2018-07-07 DIAGNOSIS — I4821 Permanent atrial fibrillation: Secondary | ICD-10-CM

## 2018-07-07 DIAGNOSIS — I5042 Chronic combined systolic (congestive) and diastolic (congestive) heart failure: Secondary | ICD-10-CM | POA: Diagnosis not present

## 2018-07-07 DIAGNOSIS — Z7901 Long term (current) use of anticoagulants: Secondary | ICD-10-CM

## 2018-07-07 NOTE — Patient Instructions (Signed)

## 2018-07-07 NOTE — Progress Notes (Signed)
Virtual Visit via Telephone Note   This visit type was conducted due to national recommendations for restrictions regarding the COVID-19 Pandemic (e.g. social distancing) in an effort to limit this patient's exposure and mitigate transmission in our community.  Due to his co-morbid illnesses, this patient is at least at moderate risk for complications without adequate follow up.  This format is felt to be most appropriate for this patient at this time.  The patient did not have access to video technology/had technical difficulties with video requiring transitioning to audio format only (telephone).  All issues noted in this document were discussed and addressed.  No physical exam could be performed with this format.  Please refer to the patient's chart for his  consent to telehealth for [facility].   Evaluation Performed:  Follow-up visit  Date:  07/07/2018   ID:  [name], DOB 1931/06/11, MRN [id number]  Patient Location: Home Provider Location: Home  PCP:  [name], MD  Cardiologist:  Buford Dresser, MD  Electrophysiologist:  None   Chief Complaint:  Follow up  History of Present Illness:    [name] is a 85 y.o. male with CAD s/p CABG, mitral valve repair, atrial fibrillation on anticoagulation with coumadin (managed by [facility]), interstitial lung disease, chronic diastolic heart failure.   The patient does not have symptoms concerning for COVID-19 infection (fever, chills, cough, or new shortness of breath).   Today: Discussed with patient's wife and patient by phone.  He had been feeling weak, though the last few days have been better. Social distancing, avoiding risk of exposure to COVID. Daughter is shopping for them,they are trying to eat healthy. Breathing and sleeping a little better. Still waiting on BiPap from sleep study, on backorder. We had discussed anticoagulation, and per patient and wife the [place] is ok with apixaban. We discussed that I do not  have updated renal function, but based on age and prior trajectory, he may be reduced dose apixaban. [facility].   Reviewed weights, vital signs. Wt 172 and has been holding steady, rare intermittent LE edema. He has been doing better since taking the lasix twice daily. Heart rates had been just over 100, but today HR is 96. BP hovering just around 974 systolic. His BP drops lower with higher dose of metoprolol, but he is tolerating once daily dose of current metoprolol.   Denies chest pain, shortness of breath at rest or with normal exertion. No PND, orthopnea, or unexpected weight gain. No syncope or palpitations.  Past Medical History:  Diagnosis Date  . Adenomatous colon polyp   . Aortic stenosis    a. mild by echo 2017.  Marland Kitchen CAD (coronary artery disease)    a. S/P CABG x 1 @ time of MV annuloplasty;  b. 02/2010 ETT: neg for ischemia.  . Carotid artery stenosis    1-39% by dopplers 10/2016  . Chronic diastolic CHF (congestive heart failure) ([place])   . CKD (chronic kidney disease), stage III ([place])   . Current use of long term anticoagulation    a. Coumadin in setting of afib.  . Diverticulosis    Colonoscopy  . Dyslipidemia   . Essential hypertension   . H/O mitral valve repair   . Hemorrhoids   . Hyperlipidemia   . Hypertension   . Permanent atrial fibrillation    a. CHA2DS2VASc = 6 -->chronic coumadin;  b. Prev on amio-->d/c 2/2 hypothyroidism. >> resumed 5/16. c. Notes  from 2017 indicate interstitial lung disease by pulm appt, question amio toxicity, also increased liver attenuation. Rate control pursued and amiodarone discontinued.  Marland Kitchen TIA (transient ischemic attack)    Past Surgical History:  Procedure Laterality Date  . CORONARY ARTERY BYPASS GRAFT    . MITRAL VALVE REPAIR       Current Meds  Medication Sig  . amoxicillin (AMOXIL) 500 MG tablet 500 mg. Take 2000 mg by mouth one hour prior to dental visits  . Cholecalciferol (VITAMIN D-3)  1000 units CAPS Take by mouth daily.   . hydrocortisone (ANUSOL-HC) 2.5 % rectal cream Place 1 application rectally 2 (two) times daily. (Patient taking differently: Place 1 application rectally as needed. )  . levothyroxine (SYNTHROID, LEVOTHROID) 50 MCG tablet Take 50 mcg by mouth at bedtime.   . metoprolol succinate (TOPROL-XL) 50 MG 24 hr tablet Take 1 tablet (50 mg total) by mouth daily.  . pravastatin (PRAVACHOL) 80 MG tablet Take 80 mg by mouth 3 (three) times a week. Mon, Wed, Fri  . warfarin (COUMADIN) 4 MG tablet Take 4 mg by mouth every evening.  . [DISCONTINUED] pravastatin (PRAVACHOL) 80 MG tablet Take 1 tablet (80 mg total) by mouth every evening. (Patient taking differently: Take 80 mg by mouth every Monday, Wednesday, and Friday. )     Allergies:   Asa [aspirin]; Flomax [tamsulosin]; and Pirfenidone   Social History   Tobacco Use  . Smoking status: Former Smoker    Packs/day: 0.10    Years: 2.00    Pack years: 0.20    Types: Cigarettes    Last attempt to quit: 03/20/1955    Years since quitting: 63.3  . Smokeless tobacco: Never Used  Substance Use Topics  . Alcohol use: No    Alcohol/week: 0.0 standard drinks  . Drug use: No     Family Hx: The patient's family history includes Heart disease in his father; Heart failure in his brother; Stroke in his brother and mother.  ROS:   Please see the history of present illness.    All other systems reviewed and are negative.   Prior CV studies:   The following studies were reviewed today: Echo 12/2017  Labs/Other Tests and Data Reviewed:    EKG:  An ECG dated 04/10/18 was personally reviewed today and demonstrated:  afib RVR with IVCD at 106 bpm  Recent Labs: 12/24/2017: Magnesium 2.0; NT-Pro BNP 10,355; TSH 3.558 02/14/2018: ALT 28; B Natriuretic Peptide 1,407.0; Hemoglobin 13.7; Platelets 112 02/20/2018: BUN 28; Creatinine, Ser 1.66; Potassium 4.0; Sodium 142   Recent Lipid Panel Lab Results  Component Value  Date/Time   CHOL 193 06/17/2017 07:44 AM   TRIG 114 06/17/2017 07:44 AM   HDL 35 (L) 06/17/2017 07:44 AM   CHOLHDL 5.5 (H) 06/17/2017 07:44 AM   CHOLHDL 2.6 03/16/2016 09:03 AM   LDLCALC 135 (H) 06/17/2017 07:44 AM    Wt Readings from Last 3 Encounters:  07/07/18 172 lb (78 kg)  06/16/18 172 lb (78 kg)  06/06/18 173 lb (78.5 kg)     Objective:    Vital Signs:  BP 98/66   Pulse 96   Ht 5' 8"  (1.727 m)   Wt 172 lb (78 kg)   BMI 26.15 kg/m     ASSESSMENT & PLAN:    Chronic systolic and diastolic heart failure: systolic failure diagnosed in 12/2017 -previously, his dry weight was used at 182 lbs. However, he feels much better at his current weight of 172 lbs. He  is taking lasix BID to maintain his weight, whereas previously he was taking lasix daily with PRN additional dosing. Cautioned that we need to monitor kidney function; this is done through the [place].  -on metoprolol succinate, tolerating current dose but has hypotension with higher doses. HR finally below 100 (had been difficulty to keep it <160 bpm with exertion when I first met him).  -working to optimize his heart rate prior to repeating echo. Ideally, would like HR below 100 while maintaining systolic BP >170. Nearing this goal. -his blood pressure cannot tolerate ACEI/ARB/ARNI/MRA at this point. -IF HR remains around 100, consider repeat echo at follow up  Atrial Fibrillation w/ RVR: Gradual improvement; during hospitalization, resting rates in the 120s and elevations >160 with exertion. -tolerating metoprolol succinate 50 mg daily, increased dose leads to worsening hypotension -not controlled on amiodarone in the past, no diltiazem given low EF, declines digoxin after shared decision making.  -INR followed by the [facility]. Have discussed DOAC in the past. Discussed again today, and they noted that their [place] provider is ok with apixaban. Again discussed that I don't have updated renal function. Option is to come to the office for  a blood draw so we can update labs, or he can talk to his [place] provider and they can dose based on his most recent labs. Suspect given age and trend of renal function, he will likely need 2.5 mg BID apixaban CHA2DS2/VAS Stroke Risk Points = 5   sleep apnea events, pulm disease:  -follows with pulmonology, recently stopped Esbriet for ILD/IPF -awaiting BiPAP shipment based on sleep study results  Not discussed at length today: History of CKD.  CAD: remote h/o CABG. Denies CP.  Goal LDL <70, previously LDL 135 (not at goal). On pravastatin 80 mg. Would like to be on high intensity statin, but will make gradual medication changes. No aspirin given need for anticoagulation. No symptoms.  Mitral Regurgitation: h/o MV repair. Mild MR on echo. Aortic Stenosis: mild AS noted on prior echo in 2017. Unclear level of stenosis on most recent echo.  COVID-19 Education: The signs and symptoms of COVID-19 were discussed with the patient and how to seek care for testing (follow up with PCP or arrange E-visit).  The importance of social distancing was discussed today.  Time:   Today, I have spent 28 minutes with the patient with telehealth technology discussing the above problems.    Patient Instructions  Medication Instructions:  Your Physician recommend you continue on your current medication as directed.    If you need a refill on your cardiac medications before your next appointment, please call your pharmacy.   Lab work: None  Testing/Procedures: None  Follow-Up: At [facility], you and your health needs are our priority.  As part of our continuing mission to provide you with exceptional heart care, we have created designated Provider Care Teams.  These Care Teams include your primary Cardiologist (physician) and Advanced Practice Providers (APPs -  Physician Assistants and Nurse Practitioners) who all work together to provide you with the care you need, when you need it. You will need a  follow up appointment in 3 months.  Please call our office 2 months in advance to schedule this appointment.  You may see Buford Dresser, MD or one of the following Advanced Practice Providers on your designated Care Team:   Rosaria Ferries, PA-C . Jory Sims, DNP, ANP       Medication Adjustments/Labs and Tests Ordered: Current medicines are reviewed  at length with the patient today.  Concerns regarding medicines are outlined above.   Tests Ordered: No orders of the defined types were placed in this encounter.   Medication Changes: No orders of the defined types were placed in this encounter.   Disposition:  Follow up 3 months  Signed, Buford Dresser, MD  07/07/2018 1:20 PM    [facility]

## 2018-07-11 ENCOUNTER — Ambulatory Visit: Payer: PPO | Admitting: Cardiology

## 2018-07-14 DIAGNOSIS — G4731 Primary central sleep apnea: Secondary | ICD-10-CM | POA: Diagnosis not present

## 2018-07-18 ENCOUNTER — Ambulatory Visit: Payer: PPO | Admitting: Internal Medicine

## 2018-08-13 DIAGNOSIS — G4731 Primary central sleep apnea: Secondary | ICD-10-CM | POA: Diagnosis not present

## 2018-08-20 DIAGNOSIS — G4731 Primary central sleep apnea: Secondary | ICD-10-CM | POA: Diagnosis not present

## 2018-08-25 DIAGNOSIS — I340 Nonrheumatic mitral (valve) insufficiency: Secondary | ICD-10-CM | POA: Diagnosis not present

## 2018-08-25 DIAGNOSIS — I480 Paroxysmal atrial fibrillation: Secondary | ICD-10-CM | POA: Diagnosis not present

## 2018-08-25 DIAGNOSIS — I10 Essential (primary) hypertension: Secondary | ICD-10-CM | POA: Diagnosis not present

## 2018-08-25 DIAGNOSIS — E7800 Pure hypercholesterolemia, unspecified: Secondary | ICD-10-CM | POA: Diagnosis not present

## 2018-08-25 DIAGNOSIS — I2510 Atherosclerotic heart disease of native coronary artery without angina pectoris: Secondary | ICD-10-CM | POA: Diagnosis not present

## 2018-08-25 DIAGNOSIS — R5383 Other fatigue: Secondary | ICD-10-CM | POA: Diagnosis not present

## 2018-08-25 DIAGNOSIS — I5022 Chronic systolic (congestive) heart failure: Secondary | ICD-10-CM | POA: Diagnosis not present

## 2018-08-25 DIAGNOSIS — N183 Chronic kidney disease, stage 3 (moderate): Secondary | ICD-10-CM | POA: Diagnosis not present

## 2018-08-25 DIAGNOSIS — J8489 Other specified interstitial pulmonary diseases: Secondary | ICD-10-CM | POA: Diagnosis not present

## 2018-08-25 DIAGNOSIS — E039 Hypothyroidism, unspecified: Secondary | ICD-10-CM | POA: Diagnosis not present

## 2018-08-26 ENCOUNTER — Other Ambulatory Visit: Payer: Self-pay | Admitting: Internal Medicine

## 2018-09-01 ENCOUNTER — Other Ambulatory Visit (HOSPITAL_COMMUNITY)
Admission: RE | Admit: 2018-09-01 | Discharge: 2018-09-01 | Disposition: A | Discharge status: Home / Self Care | Payer: PPO | Source: Ambulatory Visit | Attending: Internal Medicine | Admitting: Internal Medicine

## 2018-09-01 DIAGNOSIS — Z1159 Encounter for screening for other viral diseases: Secondary | ICD-10-CM | POA: Insufficient documentation

## 2018-09-02 LAB — NOVEL CORONAVIRUS, NAA (HOSP ORDER, SEND-OUT TO REF LAB; TAT 18-24 HRS): SARS-CoV-2, NAA: NOT DETECTED

## 2018-09-02 NOTE — Progress Notes (Signed)
Spoke with the pt's spouse [name] and notified of results per MW.

## 2018-09-04 ENCOUNTER — Other Ambulatory Visit: Payer: Self-pay

## 2018-09-04 ENCOUNTER — Encounter: Payer: Self-pay | Admitting: Nurse Practitioner

## 2018-09-04 ENCOUNTER — Ambulatory Visit: Payer: PPO | Admitting: Nurse Practitioner

## 2018-09-04 ENCOUNTER — Ambulatory Visit (INDEPENDENT_AMBULATORY_CARE_PROVIDER_SITE_OTHER): Payer: PPO | Admitting: Internal Medicine

## 2018-09-04 DIAGNOSIS — J849 Interstitial pulmonary disease, unspecified: Secondary | ICD-10-CM

## 2018-09-04 DIAGNOSIS — J84112 Idiopathic pulmonary fibrosis: Secondary | ICD-10-CM | POA: Diagnosis not present

## 2018-09-04 LAB — PULMONARY FUNCTION TEST
DL/VA % pred: 114 %
DL/VA: 4.42 ml/min/mmHg/L
DLCO unc % pred: 79 %
DLCO unc: 18.47 ml/min/mmHg
FEF 25-75 Pre: 1.74 L/sec
FEF2575-%Pred-Pre: 103 %
FEV1-%Pred-Pre: 76 %
FEV1-Pre: 1.97 L
FEV1FVC-%Pred-Pre: 112 %
FEV6-%Pred-Pre: 72 %
FEV6-Pre: 2.47 L
FEV6FVC-%Pred-Pre: 107 %
FVC-%Pred-Pre: 67 %
FVC-Pre: 2.48 L
Pre FEV1/FVC ratio: 79 %
Pre FEV6/FVC Ratio: 100 %

## 2018-09-04 NOTE — Progress Notes (Signed)
PFT B&A performed today. Patient was not able to successfully meet criteria for DLCO.

## 2018-09-04 NOTE — Assessment & Plan Note (Signed)
IPF:  Patient was diagnosed with IPF in November 2019.  He was originally started on Esbriet but could not tolerate medication and this was discontinued in January 2020.  At his last visit with Dr. Chase Caller on 04/17/2018 patient and his wife did discuss 2 potential trials that they were interested in.  Patient states that this is been a stable interval for him.  He has any significant shortness of breath.  Patient is active.  He states that he still does all of his yard work and mows his yard.  He does not complain of any significant shortness of breath when performing activities of daily living.  Patient did have amatory with DLCO in office today.  This was stable from last spirometry 1 year ago.   Patient Instructions  Glad you are doing well Continue current medications PFT was stable from previous PFT  Follow up: Follow up in ILD clinic as soon as possible or sooner if needed

## 2018-09-04 NOTE — Progress Notes (Signed)
@Patient  ID: [name], male    DOB: [birth year], 85 y.o.   MRN: [id number]  Chief Complaint  Patient presents with  . Results    PFT    Referring provider: [name], MD  HPI  85 year old male with undifferentiated interstitial lung disease stable since 2015 - progressed Nov 2019 on PFT (not on CT oct 2019- indetermniate per 2018 ATS criteria). Clinically deemed unrelated to amiodarone taken briefly 2017. IPF clinical dx given 01/22/18. Patient could not tolerate Esbriet and this was discontinued on 04/17/18.  Tests:  09/17/2015-CT chest high-res- basilar predominant patchy subpleural reticulation in both lungs no significant traction bronchiectasis or frank honeycombing.  No definite change since 02/07/2014.  Findings most consistent with NSIP. 05/15/2016-CT chest high-res- appearance of chest is compatible with ILD, strongly favored to reflect NSIP   CXR 02/14/18 -mild congestive heart failure  HRCT 01/02/18 - Spectrum of findings in the lungs remains compatible with interstitial lung disease, and appears stable compared to the prior examinations, again favored to represent nonspecific interstitial pneumonia (NSIP); technically classified as ATS indeterminate for usual interstitial pneumonia (UIP). Aortic atherosclerosis, in addition to left main and 3 vessel coronary artery disease. Status post median sternotomy for CABG. There are calcifications of the aortic valve, mitral annulus and mitral subvalvular apparatus. Echocardiographic correlation for evaluation of potential valvular dysfunction may be warranted if clinically indicated.  PFT:  PFT Results Latest Ref Rng & Units 09/04/2018 01/22/2018 11/21/2016 04/02/2016 08/31/2015  FVC-Pre L 2.48 2.55 3.08 3.05 3.24  FVC-Predicted Pre % 67 68 82 80 85  FVC-Post L - - - 2.99 3.24  FVC-Predicted Post % - - - 79 85  Pre FEV1/FVC % % 79 78 81 82 81  Post FEV1/FCV % % - - - 86 84  FEV1-Pre L 1.97 1.99 2.49 2.50 2.61  FEV1-Predicted  Pre % 76 76 94 94 97  FEV1-Post L - - - 2.57 2.73  DLCO UNC% % 79 63 78 68 75  DLCO COR %Predicted % 114 105 103 90 100  TLC L - - - - 5.14  TLC % Predicted % - - - - 74  RV % Predicted % - - - - 69     OV 09/04/18 - Follow up IPF  Presents today for follow-up for IPF.  Patient was diagnosed with IPF in November 2019.  He was originally started on Esbriet but could not tolerate medication and this was discontinued in January 2020.  At his last visit with Dr. Chase Caller on 04/17/2018 patient and his wife did discuss 2 potential trials that they were interested in.  Patient states that this is been a stable interval for him.  He has any significant shortness of breath.  Patient is active.  He states that he still does all of his yard work and mows his yard.  He does not complain of any significant shortness of breath when performing activities of daily living.  Patient did have amatory with DLCO in office today.  This was stable from last spirometry 1 year ago.  Denies f/c/s, n/v/d, hemoptysis, PND, leg swelling.     Allergies  Allergen Reactions  . Asa [Aspirin] Other (See Comments)    Bleeding- has internal hemorrhoids  . Flomax [Tamsulosin] Other (See Comments)    Made his heart go out of rhythm  . Pirfenidone     Weight loss, loss of appetite.    Immunization History  Administered Date(s) Administered  . Influenza Split 02/09/2015  .  Influenza, High Dose Seasonal PF 12/18/2015, 12/31/2017  . Pneumococcal Conjugate-13 03/20/2015  . Pneumococcal Polysaccharide-23 12/17/2000    Past Medical History:  Diagnosis Date  . Adenomatous colon polyp   . Aortic stenosis    a. mild by echo 2017.  Marland Kitchen CAD (coronary artery disease)    a. S/P CABG x 1 @ time of MV annuloplasty;  b. 02/2010 ETT: neg for ischemia.  . Carotid artery stenosis    1-39% by dopplers 10/2016  . Chronic diastolic CHF (congestive heart failure) ([place])   . CKD (chronic kidney disease), stage III ([place])   . Current use  of long term anticoagulation    a. Coumadin in setting of afib.  . Diverticulosis    Colonoscopy  . Dyslipidemia   . Essential hypertension   . H/O mitral valve repair   . Hemorrhoids   . Hyperlipidemia   . Hypertension   . Permanent atrial fibrillation    a. CHA2DS2VASc = 6 -->chronic coumadin;  b. Prev on amio-->d/c 2/2 hypothyroidism. >> resumed 5/16. c. Notes from 2017 indicate interstitial lung disease by pulm appt, question amio toxicity, also increased liver attenuation. Rate control pursued and amiodarone discontinued.  Marland Kitchen TIA (transient ischemic attack)     Tobacco History: Social History   Tobacco Use  Smoking Status Former Smoker  . Packs/day: 0.10  . Years: 2.00  . Pack years: 0.20  . Types: Cigarettes  . Quit date: 03/20/1955  . Years since quitting: 63.5  Smokeless Tobacco Never Used   Counseling given: Yes   Outpatient Encounter Medications as of 09/04/2018  Medication Sig  . amoxicillin (AMOXIL) 500 MG tablet 500 mg. Take 2000 mg by mouth one hour prior to dental visits  . Cholecalciferol (VITAMIN D-3) 1000 units CAPS Take by mouth daily.   . furosemide (LASIX) 20 MG tablet Take 1 table by mouth daily. If weight 185 or greater take 1 extra tablet (20 mg) at noon. If you have to take for 3 days or more, call. (Patient taking differently: 20 mg 2 (two) times daily. )  . hydrocortisone (ANUSOL-HC) 2.5 % rectal cream Place 1 application rectally 2 (two) times daily. (Patient taking differently: Place 1 application rectally as needed. )  . levothyroxine (SYNTHROID, LEVOTHROID) 50 MCG tablet Take 50 mcg by mouth at bedtime.   . metoprolol succinate (TOPROL-XL) 50 MG 24 hr tablet Take 1 tablet (50 mg total) by mouth daily.  . pravastatin (PRAVACHOL) 80 MG tablet Take 80 mg by mouth 3 (three) times a week. Mon, Wed, Fri  . mirtazapine (REMERON) 15 MG tablet Take 0.5 tablets by mouth daily. 0.5 tablet in AM and PM  . [DISCONTINUED] warfarin (COUMADIN) 4 MG tablet Take 4 mg  by mouth every evening.   No facility-administered encounter medications on file as of 09/04/2018.      Review of Systems  Review of Systems  Constitutional: Negative.  Negative for chills and fever.  HENT: Negative.   Respiratory: Negative for cough, shortness of breath and wheezing.   Cardiovascular: Negative.  Negative for chest pain, palpitations and leg swelling.  Gastrointestinal: Negative.   Allergic/Immunologic: Negative.   Neurological: Negative.   Psychiatric/Behavioral: Negative.        Physical Exam  BP 102/64 (BP Location: Left Arm, Patient Position: Sitting, Cuff Size: Normal)   Pulse 77   Temp 97.7 F (36.5 C)   Ht 5\' 10"  (1.778 m)   Wt 184 lb (83.5 kg)   SpO2 97%   BMI 26.40  kg/m   Wt Readings from Last 5 Encounters:  09/04/18 184 lb (83.5 kg)  07/07/18 172 lb (78 kg)  06/16/18 172 lb (78 kg)  06/06/18 173 lb (78.5 kg)  05/29/18 174 lb (78.9 kg)     Physical Exam Vitals signs and nursing note reviewed.  Constitutional:      General: He is not in acute distress.    Appearance: He is well-developed.  Cardiovascular:     Rate and Rhythm: Normal rate and regular rhythm.  Pulmonary:     Effort: Pulmonary effort is normal. No respiratory distress.     Breath sounds: Normal breath sounds. No wheezing or rhonchi.  Musculoskeletal:        General: No swelling.  Skin:    General: Skin is warm and dry.  Neurological:     Mental Status: He is alert and oriented to person, place, and time.        Assessment & Plan:   ILD (interstitial lung disease) ([place]) IPF:  Patient was diagnosed with IPF in November 2019.  He was originally started on Esbriet but could not tolerate medication and this was discontinued in January 2020.  At his last visit with Dr. Chase Caller on 04/17/2018 patient and his wife did discuss 2 potential trials that they were interested in.  Patient states that this is been a stable interval for him.  He has any significant shortness  of breath.  Patient is active.  He states that he still does all of his yard work and mows his yard.  He does not complain of any significant shortness of breath when performing activities of daily living.  Patient did have amatory with DLCO in office today.  This was stable from last spirometry 1 year ago.   Patient Instructions  Glad you are doing well Continue current medications PFT was stable from previous PFT  Follow up: Follow up in ILD clinic as soon as possible or sooner if needed       Fenton Foy, NP 09/04/2018

## 2018-09-04 NOTE — Patient Instructions (Addendum)
Glad you are doing well Continue current medications PFT was stable from previous PFT  Follow up: Follow up in ILD clinic as soon as possible or sooner if needed

## 2018-09-11 ENCOUNTER — Telehealth: Payer: Self-pay | Admitting: Internal Medicine

## 2018-09-11 ENCOUNTER — Ambulatory Visit: Payer: PPO | Admitting: Internal Medicine

## 2018-09-11 ENCOUNTER — Other Ambulatory Visit: Payer: Self-pay

## 2018-09-11 ENCOUNTER — Encounter: Payer: Self-pay | Admitting: Internal Medicine

## 2018-09-11 VITALS — BP 114/70 | HR 81 | Temp 98.2°F | Ht 68.0 in | Wt 183.2 lb

## 2018-09-11 DIAGNOSIS — J84112 Idiopathic pulmonary fibrosis: Secondary | ICD-10-CM | POA: Diagnosis not present

## 2018-09-11 DIAGNOSIS — J849 Interstitial pulmonary disease, unspecified: Secondary | ICD-10-CM

## 2018-09-11 NOTE — Telephone Encounter (Signed)
Hi Dr. Claiborne Billings,  [name] was seen in pulmonary fibrosis clinic today.  He is complaining his CPAP mask is too tight and he is finding it difficult to tolerate this.  Could you please address thank you    SIGNATURE    Dr. Brand Males, M.D., F.C.C.P,  Pulmonary and Critical Care Medicine Staff Physician, [place] Director - Interstitial Lung Disease  Program  [facility] [place] [facility] [place], [place], [address]  Pager: [phone], If no answer or between  15:00h - 7:00h: call [phone] Telephone: [phone]  4:37 PM 09/11/2018

## 2018-09-11 NOTE — Patient Instructions (Signed)
ICD-10-CM   1. IPF (idiopathic pulmonary fibrosis) (H[facility])  J84.112     You are stable compared to last week  Plan  - do HRCT in 3 months  - do spiromtery and dlco in 3 months  - I have sent a message to Dr Claiborne Billings about your CPAP mask issues - continue basic supportive cae  Followup 3 months in ILD clinic

## 2018-09-11 NOTE — Addendum Note (Signed)
Addended by: Valerie Salts on: 09/11/2018 05:28 PM   Modules accepted: Orders

## 2018-09-11 NOTE — Progress Notes (Signed)
PCP [name], MD   HPI   IOV 09/09/2015  Chief Complaint  Patient presents with   Advice Only    Referred by Dr. Radford Pax for abnormal PFT.  PFT from 08/31/15 in [place]. Pt has no breathing complaints.     85 year old male referred by Dr. Radford Pax in cardiology for abnormal pulmonary function test. He presents with his wife. At baseline he says he does not have any problems. Only thing was in 2015 he got admitted for weakness according to chart review. At that time he had a CT scan of the chest high resolution that was read by thoracic radiology and the possibility of bilateral bibasal atelectasis versus subtle interstitial lung disease was raised. However he is continued to remain asymptomatic. Sometime a year ago he was started on amiodarone for a few weeks but then most recently a few months ago went back on amiodarone because of atrial fibrillation according to him and chart review and his wife. He subsequently had pulmonary function test Pulmonary function test personally visualized and done on 08/31/2015 was normal except for mild restriction on total lung capacity 5.14 L/74% and slight reduction in DLCO 23.26/75%. Correlating with this is a 2015 CT chest read by thoracic radiologist suggesting possible ILD versus basal atelectasis  Therefore he has been referred here. He is puzzled by the referral because he feels he is asymptomatic. He works out regularly at fitness and feels well.   OV 05/01/2016  Chief Complaint  Patient presents with   Follow-up    Pt states his SOB is unchanged since last OV. Pt states he is getting over a URI, c/o prod cough with clear mucus. Pt denies SOB and CP/tightness.    [name] seen in the summer of 2017. At that time we did a CT scan of the chest that suggested persistent but stable interstitial lung disease findings compared to November 2015 but did show amio  Liver depisotis. He was asymptomatic at that time. The pain of starting  amiodarone was in the interim. He tells me now that he continues to be asymptomatic. His wife also agrees with him. He works out at at [employer] doing [employer] and aerobics. Last week he did have a respiratory infection and was down in bed for 1 day but says he is back to baseline. However pulmonary function test documented below shows a decline in forced vital capacity and diffusion capacity. I personally reviewed this result and visualized graph. Walking desaturation test 185 feet 3 laps on room air in our office: lowest pulse ox 98% and asymptomatic HR 121   He is off amio for few months per wife and him  ACCP ILD questionnaire done by his wife on his behlf - cough: mild, occ, day time x months. Denies dyspnea - smoking: started cig at age 89 2 cig/da . Quit after 20 years. REports yes for rec drugs nos - family hx lung disease: denies - home exposure - none - ocupationa: farmm work, Administrator, dairy farm, walk[place] - meds: amio hx asbove +   OV 05/25/2016  Chief Complaint  Patient presents with   Follow-up    pt states he is doing good, he exercises three times a week   Here to discuss ild workup PFT declined sinc June 2017 byut radioloigists feel no change in CT since June 2017 Autoimmune negative He and wife do not want biopsy though indicated    Results for [name], Micheal B. (  MRN [id number]) as of 05/25/2016 11:02  Ref. Range 05/01/2016 10:59 05/01/2016 10:59  Sed Rate Latest Ref Range: 0 - 20 mm/hr 12   Anit Nuclear Antibody(ANA) Latest Ref Range: NEGATIVE  NEG NEG  Angiotensin-Converting Enzyme Latest Ref Range: 9 - 67 U/L 35   Cyclic Citrullin Peptide Ab Latest Units: Units <16 <16  ds DNA Ab Latest Units: IU/mL <1   RA Latex Turbid. Latest Ref Range: <14 IU/mL <14   Ribonucleic Protein(ENA) Antibody, IgG Latest Ref Range: <1.0 NEG AI  <1.0 NEG   SSA (Ro) (ENA) Antibody, IgG Latest Ref Range: <1.0 NEG AI  <1.0 NEG   SSB (La) (ENA) Antibody, IgG Latest Ref  Range: <1.0 NEG AI  <1.0 NEG   Scleroderma (Scl-70) (ENA) Antibody, IgG Latest Ref Range: <1.0 NEG AI  <1.0 NEG    IMPRESSION: 1. 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 2. Aortic atherosclerosis, in addition to left main and 3 vessel coronary artery disease. Status post median sternotomy for CABG. 3. There are calcifications of the aortic valve. Echocardiographic correlation for evaluation of potential valvular dysfunction may be warranted if clinically indicated. 4. The severity of disease appears essentially Unchanged (compareison June 2017(). Minimal cylindrical traction bronchiectasis is noted in the basal segments of the lower lobes of the lungs bilaterally. A small amount of peripheral bronchiolectasis is also noted. No honeycombing identifi  Electronically Signed   By: Vinnie Langton M.D.   On: 05/15/2016 16:33     OV 11/21/2016  Chief Complaint  Patient presents with   Follow-up    Pt here after PFT. Pt states his breathing is unchanged since last OV. Pt denies cough, SOB, CP/tightness.    85 year old male with undifferentiated interstitial lung disease stable since 2015. Clinically deemed unrelated to amiodarone taken briefly 2017  Last visit was in March 2018. With his wife. He presents again with his wife. In the interim he continues to exercise daily at [employer]. He lifts weights. In fact is able to lift more weights than before. He does not have any dyspnea of exertion. He does not want to have lung biopsy. He is on basic supportive care with observation therapy. He again declined lung biopsy. In fact T-cell immunity does not want to do a 6 month follow-up and wants to keep an open-ended follow-up. His pulmonary function test today stable. Walking  desaturation test resting pulse ox 96% final pulse ox 97% after 185 feet 3 laps on room air. His heart rate jumped from 91/m 130/m.    OV 01/22/2018  Subjective:  Patient ID: [name], male , DOB: 22-Feb-1932 , age 71 y.o. , MRN: [id number] , ADDRESS: [address] [place] [address]   01/22/2018 -   Chief Complaint  Patient presents with   Follow-up   85 year old male with undifferentiated interstitial lung disease stable since 2015. Clinically deemed unrelated to amiodarone taken briefly 2017  HPI [name] 85 y.o. -presents for one-year follow-up of his ILD not otherwise specified.  I called him clinically undifferentiated interstitial lung disease with negative autoimmune profile although by age and male gender he should be IPF especially with negative serology.  He tells me for the last few months has had increased shortness of breath on exertion particularly with stairs.  He had an admission for heart failure according  to his history and confirmed on review of the chart in October 2019.  After that he is improved according to his wife but he tells me that he is still more short of breath than baseline.  It is relieved by rest.  There is no associated chest pain.  He had pulmonary function test today that shows a decline in lung function compared to one year showing progressive or suggestive of progressive ILD.  He did have a high-resolution CT chest that is read as indeterminate by 2018 ATS criteria.  According to the radiologist there is no progression.   IMPRESSION: 1. Spectrum of findings in the lungs remains compatible with interstitial lung disease, and appears stable compared to the prior examinations, again favored to represent nonspecific interstitial pneumonia (NSIP); technically classified as ATS indeterminate for usual interstitial pneumonia (UIP). 2. Aortic atherosclerosis, in addition to left main and 3 vessel coronary artery disease. Status post median  sternotomy for CABG. 3. There are calcifications of the aortic valve, mitral annulus and mitral subvalvular apparatus. Echocardiographic correlation for evaluation of potential valvular dysfunction may be warranted if clinically indicated.  Aortic Atherosclerosis (ICD10-I70.0).   Electronically Signed   By: Vinnie Langton M.D.   On: 01/03/2018 10:45  ROS - per HPI  OV 04/17/2018  Subjective:  Patient ID: [name], male , DOB: 01/26/1932 , age 35 y.o. , MRN: [id number] , ADDRESS: [address] [place] [address]   85 year old male with undifferentiated interstitial lung disease stable since 2015 - progressed Nov 2019 on PFT (not on CT oct 2019- indetermniate per 2018 ATS criteria). Clinically deemed unrelated to amiodarone taken briefly 2017. IPF clinical dx given 01/22/18    04/17/2018 -   Chief Complaint  Patient presents with   Follow-up    Pt stated he had problems with the Esbriet to where he became very weak and also lost the appetite and due to the side effects, pt stopped taking it and does not plan on starting back on it. Pt has not taking med in about a month. Pt states his breathing is about the same as last visit and does become SOB with exertion, has an occ cough. Denies any complaints of CP or chest tightness.     HPI [name] 85 y.o. -returns for follow-up of his IPF.  He presents with his wife.  November 2019 I gave him a formal diagnosis of IPF based on clinical grounds and then started him on pirfenidone.  But within a month he lost 14 pounds of weight.  According to his wife at least some of this was due to Lasix.  The weight loss from pirfenidone was due to low appetite.  He also became quite weak and fatigued.  Therefore he does not want to do this drug again.  His last liver function test on  Feb 14 2018 was reviewed and was normal especially the enzymes although bilirubin was 1.4 mg percent.  He has chronic kidney disease with a creatinine  of 2 mg percent and a GFR of 29.  He is currently on anticoagulation.  Because of all this he does not want to do nintedanib at all.  He and his wife wondered about the risk of progression but given the side effect profile and the safety profile they do not want to do any anti-fibrotic's.  However, they are willing to consider research trials but do not have GI side effects or have minimal side effects.  This will be in  contribution to science in the future and he seemed excited about this.  We discussed about 2 potential trials we offer especially in inhaler face to an IV infusion phase 3.  I did agree that we would pre-screen the patient for this in a few months time and discuss this more.       OV 09/11/2018  Subjective:  Patient ID: [name], male , DOB: Feb 26, 1932 , age 85 y.o. , MRN: [id number] , ADDRESS: [address] [place] [address]   09/11/2018 -  IPF followup on basic supportive care because of intolerance to Esbriet and reluctance to do nintedanib    HPI [name] 85 y.o. -presents for pulmonary fibrosis follow-up.  He has IPF.  He is on basic supportive care.  He was seen last week by nurse practitioner and deemed to be stable.  Nurse practitioner arrange for a follow-up visit when first available which was today a week after the prior visit so he is here.  No interim complaints.  He is frustrated by the quick return in appointment.  He is telling me that his CPAP mask is not working well for him.  I reviewed the chart and found that Dr. Ellouise Newer is the one who prescribes his CPAP.  I have sent a message to Dr. Ellouise Newer.  At this point in time because of the pandemic no research trial of fully recruiting and therefore we will have to reassess this in the future.  His last CT scan was in the fall 2019.    Results for [name], [name] (MRN [id number]) as of 09/11/2018 16:27  Ref. Range 08/31/2015 12:32 04/02/2016 10:33 11/21/2016 09:53 01/22/2018 10:28 09/04/2018 08:51    FVC-Pre Latest Units: L 3.24 3.05 3.08 2.55 2.48  FVC-%Pred-Pre Latest Units: % 85 80 82 68 67  Results for [name], [name] (MRN [id number]) as of 09/11/2018 16:27  Ref. Range 08/31/2015 12:32 04/02/2016 10:33 11/21/2016 09:53 01/22/2018 10:28 09/04/2018 08:51  DLCO cor Latest Units: ml/min/mmHg 23.26 20.49 23.33 21.27   DLCO cor % pred Latest Units: % 75 66 75 68     Simple office walk 185 feet x  3 laps goal with forehead probe 04/17/2018   O2 used rooom air  Number laps completed 3  Comments about pace slow  Resting Pulse Ox/HR 100% and 111/min  Final Pulse Ox/HR 98% and 137/min  Desaturated </= 88% no  Desaturated <= 3% points no  Got Tachycardic >/= 90/min Ytes, even at baseline  Symptoms at end of test no  Miscellaneous comments x      ROS - per HPI     has a past medical history of Adenomatous colon polyp, Aortic stenosis, CAD (coronary artery disease), Carotid artery stenosis, Chronic diastolic CHF (congestive heart failure) ([place]), CKD (chronic kidney disease), stage III ([place]), Current use of long term anticoagulation, Diverticulosis, Dyslipidemia, Essential hypertension, H/O mitral valve repair, Hemorrhoids, Hyperlipidemia, Hypertension, Permanent atrial fibrillation, and TIA (transient ischemic attack).   reports that he quit smoking about 63 years ago. His smoking use included cigarettes. He has a 0.20 pack-year smoking history. He has never used smokeless tobacco.  Past Surgical History:  Procedure Laterality Date   CORONARY ARTERY BYPASS GRAFT     MITRAL VALVE REPAIR      Allergies  Allergen Reactions   Asa [Aspirin] Other (See Comments)    Bleeding- has internal hemorrhoids   Flomax [Tamsulosin] Other (See Comments)    Made his heart go out of  rhythm   Pirfenidone     Weight loss, loss of appetite.    Immunization History  Administered Date(s) Administered   Influenza Split 02/09/2015   Influenza, High Dose Seasonal PF 12/18/2015, 12/31/2017    Pneumococcal Conjugate-13 03/20/2015   Pneumococcal Polysaccharide-23 12/17/2000    Family History  Problem Relation Age of Onset   Stroke Mother        Deceased   Heart disease Father        Deceased   Heart failure Brother        Deceased   Stroke Brother      Current Outpatient Medications:    amoxicillin (AMOXIL) 500 MG tablet, 500 mg. Take 2000 mg by mouth one hour prior to dental visits, Disp: , Rfl:    Cholecalciferol (VITAMIN D-3) 1000 units CAPS, Take by mouth daily. , Disp: , Rfl:    furosemide (LASIX) 20 MG tablet, Take 1 table by mouth daily. If weight 185 or greater take 1 extra tablet (20 mg) at noon. If you have to take for 3 days or more, call. (Patient taking differently: 20 mg 2 (two) times daily. ), Disp: 90 tablet, Rfl: 3   hydrocortisone (ANUSOL-HC) 2.5 % rectal cream, Place 1 application rectally 2 (two) times daily. (Patient taking differently: Place 1 application rectally as needed. ), Disp: 30 g, Rfl: 1   levothyroxine (SYNTHROID, LEVOTHROID) 50 MCG tablet, Take 50 mcg by mouth at bedtime. , Disp: , Rfl:    metoprolol succinate (TOPROL-XL) 50 MG 24 hr tablet, Take 1 tablet (50 mg total) by mouth daily., Disp: 90 tablet, Rfl: 3   mirtazapine (REMERON) 15 MG tablet, Take 0.5 tablets by mouth daily. 0.5 tablet in AM and PM, Disp: , Rfl:    pravastatin (PRAVACHOL) 80 MG tablet, Take 80 mg by mouth 3 (three) times a week. Mon, Wed, Fri, Disp: , Rfl:       Objective:   Vitals:   09/11/18 1523  BP: 114/70  Pulse: 81  Temp: 98.2 F (36.8 C)  TempSrc: Oral  SpO2: 99%  Weight: 83.1 kg  Height: 5\' 8"  (1.727 m)    Estimated body mass index is 27.86 kg/m as calculated from the following:   Height as of this encounter: 5\' 8"  (1.727 m).   Weight as of this encounter: 83.1 kg.  @WEIGHTCHANGE @  Filed Weights   09/11/18 1523  Weight: 83.1 kg     Physical Exam  General Appearance:    Alert, cooperative, no distress, appears stated age - yes ,  Deconditioned looking - no , OBESE  - yes, Sitting on Wheelchair -  no  Head:    Normocephalic, without obvious abnormality, atraumatic  Eyes:    PERRL, conjunctiva/corneas clear,  Ears:    Normal TM's and external ear canals, both ears  Nose:   Nares normal, septum midline, mucosa normal, no drainage    or sinus tenderness. OXYGEN ON  - no . Patient is @ ra   Throat:   Lips, mucosa, and tongue normal; teeth and gums normal. Cyanosis on lips - no  Neck:   Supple, symmetrical, trachea midline, no adenopathy;    thyroid:  no enlargement/tenderness/nodules; no carotid   bruit or JVD  Back:     Symmetric, no curvature, ROM normal, no CVA tenderness  Lungs:     Distress - no , Wheeze no, Barrell Chest - no, Purse lip breathing - no, Crackles - yes   Chest Wall:    No tenderness  or deformity.    Heart:    Regular rate and rhythm, S1 and S2 normal, no rub   or gallop, Murmur - no  Breast Exam:    NOT DONE  Abdomen:     Soft, non-tender, bowel sounds active all four quadrants,    no masses, no organomegaly. Visceral obesity - yes  Genitalia:   NOT DONE  Rectal:   NOT DONE  Extremities:   Extremities - normal, Has Cane - no, Clubbing - no, Edema - no  Pulses:   2+ and symmetric all extremities  Skin:   Stigmata of Connective Tissue Disease - no  Lymph nodes:   Cervical, supraclavicular, and axillary nodes normal  Psychiatric:  Neurologic:   Pleasant - yes, Anxious - no, Flat affect - no  CAm-ICU - neg, Alert and Oriented x 3 - yes, Moves all 4s - yes, Speech - normal, Cognition - intact           Assessment:       ICD-10-CM   1. IPF (idiopathic pulmonary fibrosis) ([place])  P79.480        Plan:     Patient Instructions     ICD-10-CM   1. IPF (idiopathic pulmonary fibrosis) ([place])  J84.112     You are stable compared to last week  Plan  - do HRCT in 3 months  - do spiromtery and dlco in 3 months  - I have sent a message to Dr Claiborne Billings about your CPAP mask issues - continue basic  supportive cae  Followup 3 months in ILD clinic     SIGNATURE    Dr. Brand Males, M.D., F.C.C.P,  Pulmonary and Critical Care Medicine Staff Physician, [facility] Director - Interstitial Lung Disease  Program  [facility] [place], [place], [address]  Pager: [phone], If no answer or between  15:00h - 7:00h: call [phone] Telephone: [phone]  4:45 PM 09/11/2018

## 2018-09-13 DIAGNOSIS — G4731 Primary central sleep apnea: Secondary | ICD-10-CM | POA: Diagnosis not present

## 2018-10-01 DIAGNOSIS — I480 Paroxysmal atrial fibrillation: Secondary | ICD-10-CM | POA: Diagnosis not present

## 2018-10-01 DIAGNOSIS — I2510 Atherosclerotic heart disease of native coronary artery without angina pectoris: Secondary | ICD-10-CM | POA: Diagnosis not present

## 2018-10-01 DIAGNOSIS — I5030 Unspecified diastolic (congestive) heart failure: Secondary | ICD-10-CM | POA: Diagnosis not present

## 2018-10-01 DIAGNOSIS — N183 Chronic kidney disease, stage 3 (moderate): Secondary | ICD-10-CM | POA: Diagnosis not present

## 2018-10-01 DIAGNOSIS — I10 Essential (primary) hypertension: Secondary | ICD-10-CM | POA: Diagnosis not present

## 2018-10-01 DIAGNOSIS — E7800 Pure hypercholesterolemia, unspecified: Secondary | ICD-10-CM | POA: Diagnosis not present

## 2018-10-01 DIAGNOSIS — I5022 Chronic systolic (congestive) heart failure: Secondary | ICD-10-CM | POA: Diagnosis not present

## 2018-10-01 DIAGNOSIS — I501 Left ventricular failure: Secondary | ICD-10-CM | POA: Diagnosis not present

## 2018-10-01 DIAGNOSIS — E039 Hypothyroidism, unspecified: Secondary | ICD-10-CM | POA: Diagnosis not present

## 2018-10-03 ENCOUNTER — Encounter: Payer: Self-pay | Admitting: Cardiology

## 2018-10-03 ENCOUNTER — Telehealth (INDEPENDENT_AMBULATORY_CARE_PROVIDER_SITE_OTHER): Payer: PPO | Admitting: Cardiology

## 2018-10-03 VITALS — BP 100/66 | HR 76

## 2018-10-03 DIAGNOSIS — R0609 Other forms of dyspnea: Secondary | ICD-10-CM

## 2018-10-03 DIAGNOSIS — Z7189 Other specified counseling: Secondary | ICD-10-CM

## 2018-10-03 DIAGNOSIS — I2510 Atherosclerotic heart disease of native coronary artery without angina pectoris: Secondary | ICD-10-CM | POA: Diagnosis not present

## 2018-10-03 DIAGNOSIS — I5042 Chronic combined systolic (congestive) and diastolic (congestive) heart failure: Secondary | ICD-10-CM

## 2018-10-03 DIAGNOSIS — R531 Weakness: Secondary | ICD-10-CM

## 2018-10-03 DIAGNOSIS — I4821 Permanent atrial fibrillation: Secondary | ICD-10-CM

## 2018-10-03 NOTE — Patient Instructions (Signed)

## 2018-10-03 NOTE — Progress Notes (Signed)
Virtual Visit via Telephone Note   This visit type was conducted due to national recommendations for restrictions regarding the COVID-19 Pandemic (e.g. social distancing) in an effort to limit this patient's exposure and mitigate transmission in our community.  Due to his co-morbid illnesses, this patient is at least at moderate risk for complications without adequate follow up.  This format is felt to be most appropriate for this patient at this time.  The patient did not have access to video technology/had technical difficulties with video requiring transitioning to audio format only (telephone).  All issues noted in this document were discussed and addressed.  No physical exam could be performed with this format.  Please refer to the patient's chart for his  consent to telehealth for [facility].   Date:  10/03/2018   ID:  [name], DOB September 02, 1931, MRN [id number]  Patient Location: Home Provider Location: Office  PCP:  [name], MD  Cardiologist:  Buford Dresser, MD  Electrophysiologist:  None   Evaluation Performed:  Follow-Up Visit  Chief Complaint:  General follow up/malaise  History of Present Illness:    [name] is a 85 y.o. male with CAD s/p CABG, mitral valve repair, atrial fibrillation on anticoagulation with [facility]mad[facility]), interstitial lung disease, chronic diastolic heart failure.   The patient does not have symptoms concerning for COVID-19 infection (fever, chills, cough, or new shortness of breath).   Concerns today: Having trouble with his strength, feels like he has no energy. Short of breath with exertion, which has been gradual but progressive. Weight is stable at 176 lbs. Small amount of swelling in his feet, reports as mild. BP 100/66, HR 88. HR has been much better controlled than prior, BP stable. Had been walking at the gym before coronavirus, now walks to the mailbox and back. Out of breath when he gets back to the house.  No clear PND/orthopnea. No change in diet.  Tolerating medications without issue. No chest pain, no syncope.   Past Medical History:  Diagnosis Date  . Adenomatous colon polyp   . Aortic stenosis    a. mild by echo 2017.  Marland Kitchen CAD (coronary artery disease)    a. S/P CABG x 1 @ time of MV annuloplasty;  b. 02/2010 ETT: neg for ischemia.  . Carotid artery stenosis    1-39% by dopplers 10/2016  . Chronic diastolic CHF (congestive heart failure) ([place])   . CKD (chronic kidney disease), stage III ([place])   . Current use of long term anticoagulation    a. Coumadin in setting of afib.  . Diverticulosis    Colonoscopy  . Dyslipidemia   . Essential hypertension   . H/O mitral valve repair   . Hemorrhoids   . Hyperlipidemia   . Hypertension   . Permanent atrial fibrillation    a. CHA2DS2VASc = 6 -->chronic coumadin;  b. Prev on amio-->d/c 2/2 hypothyroidism. >> resumed 5/16. c. Notes from 2017 indicate interstitial lung disease by pulm appt, question amio toxicity, also increased liver attenuation. Rate control pursued and amiodarone discontinued.  Marland Kitchen TIA (transient ischemic attack)    Past Surgical History:  Procedure Laterality Date  . CORONARY ARTERY BYPASS GRAFT    . MITRAL VALVE REPAIR       Current Meds  Medication Sig  . amoxicillin (AMOXIL) 500 MG tablet 500 mg. Take 2000 mg by mouth one hour prior to dental visits  . Cholecalciferol (VITAMIN D-3) 1000 units CAPS Take by mouth daily.   Marland Kitchen  furosemide (LASIX) 20 MG tablet Take 1 table by mouth daily. If weight 185 or greater take 1 extra tablet (20 mg) at noon. If you have to take for 3 days or more, call. (Patient taking differently: 20 mg 2 (two) times daily. )  . hydrocortisone (ANUSOL-HC) 2.5 % rectal cream Place 1 application rectally 2 (two) times daily. (Patient taking differently: Place 1 application rectally as needed. )  . levothyroxine (SYNTHROID, LEVOTHROID) 50 MCG tablet Take 50 mcg by mouth at bedtime.   . metoprolol  succinate (TOPROL-XL) 50 MG 24 hr tablet Take 1 tablet (50 mg total) by mouth daily.  . mirtazapine (REMERON) 15 MG tablet Take 0.5 tablets by mouth daily. 0.5 tablet in AM and PM  . pravastatin (PRAVACHOL) 80 MG tablet Take 80 mg by mouth 3 (three) times a week. Mon, Wed, Fri   and warfarin per [place]  Allergies:   Asa [aspirin], Flomax [tamsulosin], and Pirfenidone   Social History   Tobacco Use  . Smoking status: Former Smoker    Packs/day: 0.10    Years: 2.00    Pack years: 0.20    Types: Cigarettes    Quit date: 03/20/1955    Years since quitting: 63.5  . Smokeless tobacco: Never Used  Substance Use Topics  . Alcohol use: No    Alcohol/week: 0.0 standard drinks  . Drug use: No     Family Hx: The patient's family history includes Heart disease in his father; Heart failure in his brother; Stroke in his brother and mother.  ROS:   Please see the history of present illness.    Constitutional: Negative for chills, fever, night sweats, unintentional weight loss. Positive for decreased endurance HENT: Negative for ear pain and hearing loss.   Eyes: Negative for loss of vision and eye pain.  Respiratory: Negative for cough, sputum, wheezing.   Cardiovascular: See HPI. Gastrointestinal: Negative for abdominal pain, melena, and hematochezia.  Genitourinary: Negative for dysuria and hematuria.  Musculoskeletal: Negative for falls and myalgias.  Skin: Negative for itching and rash.  Neurological: Negative for focal weakness, focal sensory changes and loss of consciousness.  Endo/Heme/Allergies: Does not bruise/bleed easily.  All other systems reviewed and are negative.   Prior CV studies:   The following studies were reviewed today: Echo 12/2017  Labs/Other Tests and Data Reviewed:    EKG:  An ECG dated 04/10/18 was personally reviewed today and demonstrated:  afib rvr with IVCD at 106 bpm.  Recent Labs: 12/24/2017: Magnesium 2.0; NT-Pro BNP 10,355; TSH 3.558 02/14/2018: ALT 28;  B Natriuretic Peptide 1,407.0; Hemoglobin 13.7; Platelets 112 02/20/2018: BUN 28; Creatinine, Ser 1.66; Potassium 4.0; Sodium 142   Recent Lipid Panel Lab Results  Component Value Date/Time   CHOL 193 06/17/2017 07:44 AM   TRIG 114 06/17/2017 07:44 AM   HDL 35 (L) 06/17/2017 07:44 AM   CHOLHDL 5.5 (H) 06/17/2017 07:44 AM   CHOLHDL 2.6 03/16/2016 09:03 AM   LDLCALC 135 (H) 06/17/2017 07:44 AM    Wt Readings from Last 3 Encounters:  09/11/18 183 lb 3.2 oz (83.1 kg)  09/04/18 184 lb (83.5 kg)  07/07/18 172 lb (78 kg)     Objective:    Vital Signs:  BP 100/66   Pulse 76    Speaking comfortably on the phone, very hard of hearing.  ASSESSMENT & PLAN:    Chronic systolicand diastolicheart failure:systolic failure diagnosed in 12/2017 -weight has been stable on current regimen -on metoprolol succinate. Appears to be finally tolerating--initially  was very hypotensive with higher doses and could not control HR. Now BP largely 376 systolic or higher with HR less than 100.  -his fatigue/lack of exercise tolerance is vague but concerning. If it continues, would repeat echo now that his HR is better controlled. Per Dr. Golden Pop note 09/11/18, PFT did show decline in lung function on recent testing. -his blood pressure cannot tolerate ACEI/ARB/ARNI/MRA -I instructed him to call us if symptoms worsen between now and next scheduled visit, and we will expedite workup.  Atrial Fibrillation w/ RVR: Went from 120 at rest, 160 with exertion to now 70s-80s at rest. -tolerating metoprolol succinate 50 mg daily, increased dose leads to worsening hypotension -not controlled on amiodarone in the past, no diltiazem given low EF, declines digoxin after shared decision making.  -INR followed by the [facility]. Have discussed DOAC in the past -CHA2DS2/VAS Stroke Risk Points = 5   sleep apnea events, pulm disease: -follows with pulmonology, recently stopped Esbriet for ILD/IPF -issues with CPAP mask, has  had message sent to clinic to refit.  Not discussed at length today: History of CKD. CAD: remote h/o CABG. Denies CP. Goal LDL <70, previously LDL 135 (not at goal). On pravastatin 80 mg. Would like to be on high intensity statin, but will not make changes given comorbid issues. No aspirin given need for anticoagulation. No symptoms.  Mitral Regurgitation: h/o MV repair. Mild MR on echo. Aortic Stenosis: mild AS noted on prior echo in 2017. Unclear level of stenosis on most recent echo.  COVID-19 Education: The signs and symptoms of COVID-19 were discussed with the patient and how to seek care for testing (follow up with PCP or arrange E-visit).  The importance of social distancing was discussed today.  Time:   Today, I have spent 15 minutes with the patient with telehealth technology discussing the above problems.     Medication Adjustments/Labs and Tests Ordered: Current medicines are reviewed at length with the patient today.  Concerns regarding medicines are outlined above.   Patient Instructions  Medication Instructions:  Your Physician recommend you continue on your current medication as directed.    If you need a refill on your cardiac medications before your next appointment, please call your pharmacy.   Lab work: None  Testing/Procedures: None  Follow-Up: At [facility], you and your health needs are our priority.  As part of our continuing mission to provide you with exceptional heart care, we have created designated Provider Care Teams.  These Care Teams include your primary Cardiologist (physician) and Advanced Practice Providers (APPs -  Physician Assistants and Nurse Practitioners) who all work together to provide you with the care you need, when you need it. You will need a follow up appointment in 3 months.  Please call our office 2 months in advance to schedule this appointment.  You may see Buford Dresser, MD or one of the following Advanced Practice  Providers on your designated Care Team:   Rosaria Ferries, PA-C . Jory Sims, DNP, ANP       Signed, Buford Dresser, MD  10/03/2018 3:53 PM    [facility]

## 2018-10-10 ENCOUNTER — Encounter: Payer: Self-pay | Admitting: Cardiology

## 2018-10-13 DIAGNOSIS — G4731 Primary central sleep apnea: Secondary | ICD-10-CM | POA: Diagnosis not present

## 2018-10-14 ENCOUNTER — Telehealth: Payer: Self-pay | Admitting: Cardiology

## 2018-10-14 NOTE — Telephone Encounter (Signed)
Phoned and spoke with patient's wife [name]. Explained that BiPap is prescribed and care managed by pulmonologist rather than cardiologist. She verbalizes understanding and will follow-up with pulmonologist.

## 2018-10-14 NOTE — Telephone Encounter (Signed)
New Message           Patient's wife is calling in about bi/pap that her husband is using, she states it only works about 2 good hours and then it fills his stomach with air. So the  Machine is not working properly.

## 2018-10-15 ENCOUNTER — Telehealth: Payer: Self-pay | Admitting: Internal Medicine

## 2018-10-15 DIAGNOSIS — E039 Hypothyroidism, unspecified: Secondary | ICD-10-CM | POA: Diagnosis not present

## 2018-10-15 DIAGNOSIS — N183 Chronic kidney disease, stage 3 (moderate): Secondary | ICD-10-CM | POA: Diagnosis not present

## 2018-10-15 DIAGNOSIS — R5383 Other fatigue: Secondary | ICD-10-CM | POA: Diagnosis not present

## 2018-10-15 DIAGNOSIS — I5022 Chronic systolic (congestive) heart failure: Secondary | ICD-10-CM | POA: Diagnosis not present

## 2018-10-15 DIAGNOSIS — I480 Paroxysmal atrial fibrillation: Secondary | ICD-10-CM | POA: Diagnosis not present

## 2018-10-15 DIAGNOSIS — J8489 Other specified interstitial pulmonary diseases: Secondary | ICD-10-CM | POA: Diagnosis not present

## 2018-10-15 DIAGNOSIS — I2510 Atherosclerotic heart disease of native coronary artery without angina pectoris: Secondary | ICD-10-CM | POA: Diagnosis not present

## 2018-10-15 DIAGNOSIS — G4700 Insomnia, unspecified: Secondary | ICD-10-CM | POA: Diagnosis not present

## 2018-10-15 DIAGNOSIS — I10 Essential (primary) hypertension: Secondary | ICD-10-CM | POA: Diagnosis not present

## 2018-10-15 NOTE — Telephone Encounter (Signed)
Left message for patient's wife to call back.   Attempted to get download from [employer] or [employer] but patient was not enrolled.

## 2018-10-16 NOTE — Telephone Encounter (Signed)
LMTCB x2  

## 2018-10-17 ENCOUNTER — Telehealth: Payer: Self-pay | Admitting: Cardiovascular Disease

## 2018-10-17 NOTE — Telephone Encounter (Signed)
Called and spoke with wife of patient. She states the patient gets his equipment from [facility]. Per last OV by MR, Dr. Shelva Majestic manages the CPAP machine.  Called Dr. Evette Georges office let nurse know patient is calling our office in relation to his machine and if anyone could reach out to patient regarding the pressure issues he is having with machine. Nurse verified patient did see Dr. Claiborne Billings in March and she would leave a message for someone to contact the patient.  Nothing further needed at this time

## 2018-10-17 NOTE — Telephone Encounter (Signed)
Telephone call to patient. Left message to return call. 

## 2018-10-17 NOTE — Telephone Encounter (Signed)
°  Wife called [facility] about the patient's cpap machine. [facility] contacted [place], and wants [place] to call the wife in regards to the issue.  The wife states he uses [place].  Please advise

## 2018-10-21 NOTE — Telephone Encounter (Signed)
Please advise. Thanks.  

## 2018-10-21 NOTE — Telephone Encounter (Signed)
Follow Up:    Returnig your call from Friday

## 2018-10-28 ENCOUNTER — Telehealth: Payer: Self-pay | Admitting: Cardiology

## 2018-10-28 NOTE — Telephone Encounter (Signed)
Returned call to pt's wife (DPR) she states that pt's foot is dark and dusky from his toes to his ankle. It is not cold to the touch but this colo concerns me I have directed her to take him to the ER to be evaluated. Verbalizes understanding. She will call daughter and have her take him/them.

## 2018-10-28 NOTE — Telephone Encounter (Signed)
New message  Patient's wife states that his left foot is turning purple due to lack of circulation. Please call to discuss.

## 2018-10-30 ENCOUNTER — Other Ambulatory Visit: Payer: Self-pay | Admitting: Family Medicine

## 2018-10-30 DIAGNOSIS — L819 Disorder of pigmentation, unspecified: Secondary | ICD-10-CM

## 2018-11-06 NOTE — Telephone Encounter (Signed)
Reached out to patient and he states his stomach gets filled with air during the night and has to burp a lot during the night and it is painful and he sits up for awhile until he gets rid of all the air.  Please advise

## 2018-11-07 ENCOUNTER — Ambulatory Visit
Admission: RE | Admit: 2018-11-07 | Discharge: 2018-11-07 | Disposition: A | Discharge status: Home / Self Care | Payer: PPO | Source: Ambulatory Visit | Attending: Family Medicine | Admitting: Family Medicine

## 2018-11-07 ENCOUNTER — Telehealth: Payer: Self-pay | Admitting: Cardiovascular Disease

## 2018-11-07 DIAGNOSIS — R209 Unspecified disturbances of skin sensation: Secondary | ICD-10-CM | POA: Diagnosis not present

## 2018-11-07 DIAGNOSIS — L819 Disorder of pigmentation, unspecified: Secondary | ICD-10-CM

## 2018-11-07 NOTE — Telephone Encounter (Signed)
Called and spoke to wife- they suggest that the pressures are high on the BIPAP machine and he is not able to use it. I advised that both the sleep coordinator and Dr.Kelly were on vacation this week but would return Monday to assist. Patient verbalized understanding  Dr.Kelly- I have emailed you the resmed download to advise of changes. Will route Mariann Laster on this as well as I am off Monday and if changes need to be made she can advise them too.  Thank you!

## 2018-11-07 NOTE — Telephone Encounter (Signed)
New Message  Patient's wife calling in for an order to have the pressure turned down on the patient's BiPAP. States that patient is only able to wear the BiPAP for 2 hours at night because it is filling his stomach with air and he wakes up burping. Please give patient's wife a call back.

## 2018-11-13 DIAGNOSIS — E039 Hypothyroidism, unspecified: Secondary | ICD-10-CM | POA: Diagnosis not present

## 2018-11-13 DIAGNOSIS — I5022 Chronic systolic (congestive) heart failure: Secondary | ICD-10-CM | POA: Diagnosis not present

## 2018-11-13 DIAGNOSIS — I501 Left ventricular failure: Secondary | ICD-10-CM | POA: Diagnosis not present

## 2018-11-13 DIAGNOSIS — I480 Paroxysmal atrial fibrillation: Secondary | ICD-10-CM | POA: Diagnosis not present

## 2018-11-13 DIAGNOSIS — I2510 Atherosclerotic heart disease of native coronary artery without angina pectoris: Secondary | ICD-10-CM | POA: Diagnosis not present

## 2018-11-13 DIAGNOSIS — I5030 Unspecified diastolic (congestive) heart failure: Secondary | ICD-10-CM | POA: Diagnosis not present

## 2018-11-13 DIAGNOSIS — I10 Essential (primary) hypertension: Secondary | ICD-10-CM | POA: Diagnosis not present

## 2018-11-13 DIAGNOSIS — G4731 Primary central sleep apnea: Secondary | ICD-10-CM | POA: Diagnosis not present

## 2018-11-13 DIAGNOSIS — E7800 Pure hypercholesterolemia, unspecified: Secondary | ICD-10-CM | POA: Diagnosis not present

## 2018-11-13 DIAGNOSIS — N183 Chronic kidney disease, stage 3 (moderate): Secondary | ICD-10-CM | POA: Diagnosis not present

## 2018-11-13 NOTE — Telephone Encounter (Signed)
Should probably check with DME company to make certain he has the proper mask size and mask fit.  If he is securing the mask too tight this also affect his cushion movement.  We will also have Mariann Laster reach out to him to determine the specifics of his mask.  Sounds like he may also be swallowing a lot of air.

## 2018-12-09 ENCOUNTER — Ambulatory Visit (INDEPENDENT_AMBULATORY_CARE_PROVIDER_SITE_OTHER)
Admission: RE | Admit: 2018-12-09 | Discharge: 2018-12-09 | Disposition: A | Discharge status: Home / Self Care | Payer: PPO | Source: Ambulatory Visit | Attending: Internal Medicine | Admitting: Internal Medicine

## 2018-12-09 ENCOUNTER — Other Ambulatory Visit: Payer: Self-pay

## 2018-12-09 ENCOUNTER — Encounter (INDEPENDENT_AMBULATORY_CARE_PROVIDER_SITE_OTHER): Payer: Self-pay

## 2018-12-09 DIAGNOSIS — J984 Other disorders of lung: Secondary | ICD-10-CM | POA: Diagnosis not present

## 2018-12-09 DIAGNOSIS — J849 Interstitial pulmonary disease, unspecified: Secondary | ICD-10-CM | POA: Diagnosis not present

## 2018-12-11 DIAGNOSIS — G4733 Obstructive sleep apnea (adult) (pediatric): Secondary | ICD-10-CM | POA: Diagnosis not present

## 2018-12-11 DIAGNOSIS — I2510 Atherosclerotic heart disease of native coronary artery without angina pectoris: Secondary | ICD-10-CM | POA: Diagnosis not present

## 2018-12-11 DIAGNOSIS — I480 Paroxysmal atrial fibrillation: Secondary | ICD-10-CM | POA: Diagnosis not present

## 2018-12-11 DIAGNOSIS — E7800 Pure hypercholesterolemia, unspecified: Secondary | ICD-10-CM | POA: Diagnosis not present

## 2018-12-11 DIAGNOSIS — I10 Essential (primary) hypertension: Secondary | ICD-10-CM | POA: Diagnosis not present

## 2018-12-11 DIAGNOSIS — D6869 Other thrombophilia: Secondary | ICD-10-CM | POA: Diagnosis not present

## 2018-12-11 DIAGNOSIS — E039 Hypothyroidism, unspecified: Secondary | ICD-10-CM | POA: Diagnosis not present

## 2018-12-11 DIAGNOSIS — N183 Chronic kidney disease, stage 3 (moderate): Secondary | ICD-10-CM | POA: Diagnosis not present

## 2018-12-11 DIAGNOSIS — I5022 Chronic systolic (congestive) heart failure: Secondary | ICD-10-CM | POA: Diagnosis not present

## 2018-12-14 DIAGNOSIS — G4731 Primary central sleep apnea: Secondary | ICD-10-CM | POA: Diagnosis not present

## 2018-12-17 DIAGNOSIS — E7800 Pure hypercholesterolemia, unspecified: Secondary | ICD-10-CM | POA: Diagnosis not present

## 2018-12-17 DIAGNOSIS — I5022 Chronic systolic (congestive) heart failure: Secondary | ICD-10-CM | POA: Diagnosis not present

## 2018-12-17 DIAGNOSIS — I501 Left ventricular failure: Secondary | ICD-10-CM | POA: Diagnosis not present

## 2018-12-17 DIAGNOSIS — N183 Chronic kidney disease, stage 3 (moderate): Secondary | ICD-10-CM | POA: Diagnosis not present

## 2018-12-17 DIAGNOSIS — I5030 Unspecified diastolic (congestive) heart failure: Secondary | ICD-10-CM | POA: Diagnosis not present

## 2018-12-17 DIAGNOSIS — E039 Hypothyroidism, unspecified: Secondary | ICD-10-CM | POA: Diagnosis not present

## 2018-12-17 DIAGNOSIS — I10 Essential (primary) hypertension: Secondary | ICD-10-CM | POA: Diagnosis not present

## 2018-12-17 DIAGNOSIS — I2510 Atherosclerotic heart disease of native coronary artery without angina pectoris: Secondary | ICD-10-CM | POA: Diagnosis not present

## 2018-12-18 DIAGNOSIS — G4730 Sleep apnea, unspecified: Secondary | ICD-10-CM

## 2018-12-18 HISTORY — DX: Sleep apnea, unspecified: G47.30

## 2018-12-28 ENCOUNTER — Other Ambulatory Visit: Payer: Self-pay | Admitting: Cardiology

## 2018-12-28 DIAGNOSIS — I5042 Chronic combined systolic (congestive) and diastolic (congestive) heart failure: Secondary | ICD-10-CM

## 2018-12-28 DIAGNOSIS — I4891 Unspecified atrial fibrillation: Secondary | ICD-10-CM

## 2019-01-05 ENCOUNTER — Encounter: Payer: Self-pay | Admitting: Cardiology

## 2019-01-05 ENCOUNTER — Ambulatory Visit: Payer: PPO | Admitting: Cardiology

## 2019-01-05 ENCOUNTER — Other Ambulatory Visit: Payer: Self-pay

## 2019-01-05 VITALS — BP 118/88 | HR 75 | Temp 96.8°F | Ht 68.0 in | Wt 175.6 lb

## 2019-01-05 DIAGNOSIS — Z9889 Other specified postprocedural states: Secondary | ICD-10-CM | POA: Diagnosis not present

## 2019-01-05 DIAGNOSIS — Z79899 Other long term (current) drug therapy: Secondary | ICD-10-CM | POA: Diagnosis not present

## 2019-01-05 DIAGNOSIS — R5382 Chronic fatigue, unspecified: Secondary | ICD-10-CM | POA: Diagnosis not present

## 2019-01-05 DIAGNOSIS — R600 Localized edema: Secondary | ICD-10-CM | POA: Diagnosis not present

## 2019-01-05 DIAGNOSIS — N184 Chronic kidney disease, stage 4 (severe): Secondary | ICD-10-CM

## 2019-01-05 DIAGNOSIS — I2510 Atherosclerotic heart disease of native coronary artery without angina pectoris: Secondary | ICD-10-CM | POA: Diagnosis not present

## 2019-01-05 DIAGNOSIS — Z7901 Long term (current) use of anticoagulants: Secondary | ICD-10-CM | POA: Diagnosis not present

## 2019-01-05 DIAGNOSIS — I482 Chronic atrial fibrillation, unspecified: Secondary | ICD-10-CM

## 2019-01-05 DIAGNOSIS — I5042 Chronic combined systolic (congestive) and diastolic (congestive) heart failure: Secondary | ICD-10-CM | POA: Diagnosis not present

## 2019-01-05 DIAGNOSIS — I350 Nonrheumatic aortic (valve) stenosis: Secondary | ICD-10-CM | POA: Diagnosis not present

## 2019-01-05 DIAGNOSIS — R011 Cardiac murmur, unspecified: Secondary | ICD-10-CM | POA: Diagnosis not present

## 2019-01-05 MED ORDER — FUROSEMIDE 20 MG PO TABS: ORAL_TABLET | ORAL | 0 refills | Status: DC

## 2019-01-05 NOTE — Patient Instructions (Signed)
Medication Instructions:  Your Physician recommend you continue on your current medication as directed.    *If you need a refill on your cardiac medications before your next appointment, please call your pharmacy*  Lab Work: Your physician recommends that you return for lab work today (BMP0  If you have labs (blood work) drawn today and your tests are completely normal, you will receive your results only by: Marland Kitchen MyChart Message (if you have MyChart) OR . A paper copy in the mail If you have any lab test that is abnormal or we need to change your treatment, we will call you to review the results.  Testing/Procedures: Your physician has requested that you have an echocardiogram. Echocardiography is a painless test that uses sound waves to create images of your heart. It provides your doctor with information about the size and shape of your heart and how well your heart's chambers and valves are working. This procedure takes approximately one hour. There are no restrictions for this procedure. [address]   Follow-Up: At [facility], you and your health needs are our priority.  As part of our continuing mission to provide you with exceptional heart care, we have created designated Provider Care Teams.  These Care Teams include your primary Cardiologist (physician) and Advanced Practice Providers (APPs -  Physician Assistants and Nurse Practitioners) who all work together to provide you with the care you need, when you need it.  Your next appointment:   3 months  The format for your next appointment:   Virtual Visit   Provider:   Buford Dresser, MD

## 2019-01-05 NOTE — Progress Notes (Signed)
Cardiology Office Note:    Date:  01/05/2019   ID:  [name], DOB April 01, 1931, MRN [id number]  PCP:  Micheal Frees, MD  Cardiologist:  Buford Dresser, MD PhD  Referring MD: Micheal Frees, MD   CC: follow up  History of Present Illness:    [name] is a 85 y.o. male with a hx of CAD s/p CABG, mitral valve repair, atrial fibrillation on anticoagulation with coumadin (managed by [facility]), interstitial lung disease, chronic diastolic heart failure who is seen in follow up today for the evaluation and management of systolic and diastolic heart failure, atrial fibrillation. He follows at the [facility] in [place].   Cardiac history: He was discharged on 12/27/17 after hospitalization for acute on chronic diastolic heart failure and atrial fibrillation with rapid ventricular response. During his hospitalization, he had a new drop in his ejection fraction, consistent with new acute systolic heart failure in addition to his diastolic heart failure. He was diuresed >5 L, with weight change from 190 lbs to 179 lbs at discharge. His hypotension had limited rate control agents in the past, and he was not on a beta blocker at the time of admission. He was titrated onto a low dose of beta blocker, limited by symptoms. His blood pressure was too low to add ACEI/ARB.   Today: Continues to be very weak. Some days has a hard time working in his shop, climbing stairs. Not every time, but sometimes. Had a liver scan at the [facility], told mild scarring, nothing than needs done. Went to [employer], recommended to use milk thistle to help his liver.   Taking lasix BID, if missed PM dose has fluid retention, feels more fatigue.   BP log from last 1-2 weeks. All before he takes his AM medications. HR improves after meds. Tolerating all meds. No syncope, no lightheadedness. Using bipap at night. Weights are now stable on BID lasix. 100/60, HR 107 94/66, HR 107 95/68, HR 109 97/57, HR 86 106/78, HR  117 113/70, HR 107 90/63, HR 102 107/71, HR 105 102/68, HR 98 112/70, HR 105 114/69, HR 118  Denies chest pain, shortness of breath at rest or with normal exertion. No PND, orthopnea, or unexpected weight gain. No syncope or palpitations.  Past Medical History:  Diagnosis Date  . Adenomatous colon polyp   . Aortic stenosis    a. mild by echo 2017.  Marland Kitchen CAD (coronary artery disease)    a. S/P CABG x 1 @ time of MV annuloplasty;  b. 02/2010 ETT: neg for ischemia.  . Carotid artery stenosis    1-39% by dopplers 10/2016  . Chronic diastolic CHF (congestive heart failure) ([place])   . CKD (chronic kidney disease), stage III   . Current use of long term anticoagulation    a. Coumadin in setting of afib.  . Diverticulosis    Colonoscopy  . Dyslipidemia   . Essential hypertension   . H/O mitral valve repair   . Hemorrhoids   . Hyperlipidemia   . Hypertension   . Permanent atrial fibrillation (HCC)    a. CHA2DS2VASc = 6 -->chronic coumadin;  b. Prev on amio-->d/c 2/2 hypothyroidism. >> resumed 5/16. c. Notes from 2017 indicate interstitial lung disease by pulm appt, question amio toxicity, also increased liver attenuation. Rate control pursued and amiodarone discontinued.  Marland Kitchen TIA (transient ischemic attack)     Past Surgical History:  Procedure Laterality Date  . CORONARY ARTERY BYPASS GRAFT    . MITRAL VALVE  REPAIR      Current Medications: Current Outpatient Medications on File Prior to Visit  Medication Sig  . amoxicillin (AMOXIL) 500 MG tablet 500 mg. Take 2000 mg by mouth one hour prior to dental visits  . apixaban (ELIQUIS) 2.5 MG TABS tablet Take 2.5 mg by mouth 2 (two) times daily.  . Cholecalciferol (VITAMIN D-3) 1000 units CAPS Take by mouth daily.   . hydrocortisone (ANUSOL-HC) 2.5 % rectal cream Place 1 application rectally 2 (two) times daily. (Patient taking differently: Place 1 application rectally as needed. )  . levothyroxine (SYNTHROID, LEVOTHROID) 50 MCG tablet  Take 50 mcg by mouth at bedtime.   . metoprolol succinate (TOPROL-XL) 50 MG 24 hr tablet Take 1 tablet (50 mg total) by mouth daily.  . mirtazapine (REMERON) 15 MG tablet Take 0.5 tablets by mouth daily. 0.5 tablet in AM and PM  . pravastatin (PRAVACHOL) 80 MG tablet Take 80 mg by mouth 3 (three) times a week. Mon, Wed, Fri   No current facility-administered medications on file prior to visit.      Allergies:   Asa [aspirin], Flomax [tamsulosin], and Pirfenidone   Social History   Socioeconomic History  . Marital status: Married    Spouse name: Not on file  . Number of children: Not on file  . Years of education: Not on file  . Highest education level: Not on file  Occupational History  . 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 Financial resource strain: Not on file  . Food insecurity    Worry: Not on file    Inability: Not on file  . Transportation needs    Medical: Not on file    Non-medical: Not on file  Tobacco Use  . Smoking status: Former Smoker    Packs/day: 0.10    Years: 2.00    Pack years: 0.20    Types: Cigarettes    Quit date: 03/20/1955    Years since quitting: 63.8  . Smokeless tobacco: Never Used  Substance and Sexual Activity  . Alcohol use: No    Alcohol/week: 0.0 standard drinks  . Drug use: No  . Sexual activity: Not Currently  Lifestyle  . Physical activity    Days per week: Not on file    Minutes per session: Not on file  . Stress: Not on file  Relationships  . Social Herbalist on phone: Not on file    Gets together: Not on file    Attends religious service: Not on file    Active member of club or organization: Not on file    Attends meetings of clubs or organizations: Not on file    Relationship status: Not on file  Other Topics Concern  . Not on file  Social History Narrative  . Not on file     Family History: The patient's family history includes Heart disease in his father; Heart failure in his  brother; Stroke in his brother and mother.  ROS:   Please see the history of present illness.  Additional pertinent ROS: Constitutional: Negative for chills, fever, night sweats, unintentional weight loss. Positive for fatigue. HENT: Negative for ear pain, has chronic hearing loss.   Eyes: Negative for loss of vision and eye pain.  Respiratory: Negative for cough, sputum, wheezing.   Cardiovascular: See HPI. Gastrointestinal: Negative for abdominal pain, melena, and hematochezia.  Genitourinary: Negative for dysuria and hematuria.  Musculoskeletal: Negative for falls and myalgias.  Skin: Negative for itching and rash.  Neurological: Negative for focal weakness, focal sensory changes and loss of consciousness.  Endo/Heme/Allergies: Does bruise/bleed easily.   EKGs/Labs/Other Studies Reviewed:    The following studies were reviewed today:  Echo 12/25/17 Study Conclusions - Left ventricle: The cavity size was mildly dilated. Systolic function was severely reduced. The estimated ejection fraction was in the range of 20% to 25%. Severe diffuse hypokinesis. The study was not technically sufficient to allow evaluation of LV diastolic dysfunction due to atrial fibrillation. Doppler parameters are consistent with high ventricular filling pressure. - Aortic valve: There was mild regurgitation. Valve area (VTI): 0.7 cm^2. Valve area (Vmax): 0.85 cm^2. Valve area (Vmean): 0.76 cm^2. - Mitral valve: S/P prior MV repair Moderate diffuse thickening and calcification of the anterior leaflet and posterior leaflet, with mild involvement of chords. Mobility of the posterior leaflet was restricted to the point of immobility. There was mild regurgitation. - Left atrium: The atrium was mildly dilated. - Right ventricle: Systolic function was mildly reduced. - Tricuspid valve: There was trivial regurgitation. - Pulmonary arteries: PA peak pressure: 48 mm Hg (S).   Impressions: - Mildly dilated LV with severe LV dysfunction. Cannot assess diastolic function due to underlying atrial fibrillation but there is increased LV filling pressures. S/P MV repair with mild MR and moderately thickened MV leaflets. The posterior leaflet appears fixed. The AV is poorly visualized but there is calcification of the valve. Cannot assess degree of AS given poor visualization and gradients may be falsely low in setting of LV dysfunction. Consider TEE for further evaluation.  EKG:  Personally reviewed. The ekg ordered today demonstrates atrial fibrillation, rate controlled at 75 bpm  Recent Labs: 02/14/2018: ALT 28; B Natriuretic Peptide 1,407.0; Hemoglobin 13.7; Platelets 112 02/20/2018: BUN 28; Creatinine, Ser 1.66; Potassium 4.0; Sodium 142  Recent Lipid Panel    Component Value Date/Time   CHOL 193 06/17/2017 0744   TRIG 114 06/17/2017 0744   HDL 35 (L) 06/17/2017 0744   CHOLHDL 5.5 (H) 06/17/2017 0744   CHOLHDL 2.6 03/16/2016 0903   VLDL 20 03/16/2016 0903   LDLCALC 135 (H) 06/17/2017 0744    Physical Exam:    VS:  BP 118/88   Pulse 75   Temp (!) 96.8 F (36 C)   Ht _0  (1.727 m)   Wt 175 lb 9.6 oz (79.7 kg)   SpO2 95%   BMI 26.70 kg/m     Wt Readings from Last 3 Encounters:  01/05/19 175 lb 9.6 oz (79.7 kg)  09/11/18 183 lb 3.2 oz (83.1 kg)  09/04/18 184 lb (83.5 kg)    GEN: Well nourished, well developed in no acute distress HEENT: Normal, moist mucous membranes NECK: No JVD at 90 degrees, prominent carotid pulsation CARDIAC: regular rhythm, normal S1 and S2, no rubs or gallops. 3/6 harsh mid peaking SEM with S2 (though less appreciable than prior) VASCULAR: Radial and DP pulses 2+ bilaterally. No carotid bruits RESPIRATORY:  Clear to auscultation without rales, wheezing or rhonchi  ABDOMEN: Soft, non-tender, non-distended MUSCULOSKELETAL:  Ambulates independently SKIN: Warm and dry, trace to 1+ bilateral LE edema  NEUROLOGIC:  Alert and oriented x 3. No focal neuro deficits noted. PSYCHIATRIC:  Normal affect   ASSESSMENT:    1. Chronic combined systolic and diastolic heart failure ([place])   2. Murmur   3. Lower extremity edema   4. Medication management   5. Nonrheumatic aortic valve stenosis   6. Chronic atrial fibrillation (HCC)  7. Long term (current) use of anticoagulants [Z79.01]   8. H/O mitral valve repair   9. CKD (chronic kidney disease) stage 4, GFR 15-29 ml/min (HCC)   10. Chronic fatigue   11. Coronary artery disease involving native coronary artery of native heart without angina pectoris    PLAN:   Chronic systolicand diastolicheart failuure:systolic failure diagnosed in 12/2017 -weight has been stable on current regimen -had to use BID furosemide to keep up on the swelling. Given the degree of CKD, I would not be surprised if he needs a higher dose of furosemide to continue good urine output. Checking kidney function today -continue metoprolol succinate -no room for ACEi/ARB/ARNI/MRA  Fatigue: unclear etiology, intermittent. May be due to heart failure, afib, lung disease, or other -continue to monitor  Atrial Fibrillation: history of difficult to control RVR, was going 160 bpm with minimal exertion when I met him. Today's heart rate is the best I have seen -tolerating metoprolol succinate 50 mg daily, increased dose leads to worsening hypotension -not controlled on amiodarone in the past (and with liver/lung pathology, would avoid), no diltiazem given low EF, declines digoxin after shared decision making.  -tolerating apixaban 2.5 mg BID -CHA2DS2/VAS Stroke Risk Points=5   sleep apnea events, pulm disease: -follows with pulmonology, recently stopped Esbriet for ILD/IPF -uses Bipap  History of CKD: last stage 4, recheck today -suspect he will need increasing doses of furosemide given his CKD  CAD: remote h/o CABG. -no chest pain -on pravastatin (see below), no  aspirin given apixaban for afib  Hyperlipidemia: -Goal LDL <70, previously LDL 135 (not at goal). May have been recently checked at [facility]. -On pravastatin 80 mg. Would like to be on high intensity statin, but given comorbidities will hold on intensifying.  Mitral Regurgitation: h/o MV repair. Mild MR on echo. No appreciated on exam (may be masked by AS)  Aortic Stenosis: mild AS noted on prior echo in 2017. However, murmur sounds slightly worse to me today than prior. With worsening LE edema, will make sure this has not progressed. Need to consider low flow low gradient AS if his EF is still reduced.   Plan for follow up: 3 mos or sooner PRN  Buford Dresser, MD, PhD   [facility]   Medication Adjustments/Labs and Tests Ordered: Current medicines are reviewed at length with the patient today.  Concerns regarding medicines are outlined above.  Orders Placed This Encounter  Procedures  . Basic metabolic panel  . EKG 12-Lead  . ECHOCARDIOGRAM COMPLETE   Meds ordered this encounter  Medications  . furosemide (LASIX) 20 MG tablet    Sig: Take one 20 mg tab twice daily    Dispense:  90 tablet    Refill:  0    Patient Instructions  Medication Instructions:  Your Physician recommend you continue on your current medication as directed.    *If you need a refill on your cardiac medications before your next appointment, please call your pharmacy*  Lab Work: Your physician recommends that you return for lab work today (BMP0  If you have labs (blood work) drawn today and your tests are completely normal, you will receive your results only by: Marland Kitchen MyChart Message (if you have MyChart) OR . A paper copy in the mail If you have any lab test that is abnormal or we need to change your treatment, we will call you to review the results.  Testing/Procedures: Your physician has requested that you have an echocardiogram. Echocardiography is a  painless test that uses sound waves  to create images of your heart. It provides your doctor with information about the size and shape of your heart and how well your heart's chambers and valves are working. This procedure takes approximately one hour. There are no restrictions for this procedure. [address]   Follow-Up: At [facility], you and your health needs are our priority.  As part of our continuing mission to provide you with exceptional heart care, we have created designated Provider Care Teams.  These Care Teams include your primary Cardiologist (physician) and Advanced Practice Providers (APPs -  Physician Assistants and Nurse Practitioners) who all work together to provide you with the care you need, when you need it.  Your next appointment:   3 months  The format for your next appointment:   Virtual Visit   Provider:   Buford Dresser, MD      Signed, Buford Dresser, MD PhD 01/05/2019 12:58 PM    [place]

## 2019-01-06 LAB — BASIC METABOLIC PANEL
BUN/Creatinine Ratio: 19 (ref 10–24)
BUN: 33 mg/dL — ABNORMAL HIGH (ref 8–27)
CO2: 23 mmol/L (ref 20–29)
Calcium: 9.8 mg/dL (ref 8.6–10.2)
Chloride: 103 mmol/L (ref 96–106)
Creatinine, Ser: 1.78 mg/dL — ABNORMAL HIGH (ref 0.76–1.27)
GFR calc Af Amer: 40 mL/min/{1.73_m2} — ABNORMAL LOW (ref >59)
GFR calc non Af Amer: 34 mL/min/{1.73_m2} — ABNORMAL LOW (ref >59)
Glucose: 98 mg/dL (ref 65–99)
Potassium: 5.1 mmol/L (ref 3.5–5.2)
Sodium: 141 mmol/L (ref 134–144)

## 2019-01-08 DIAGNOSIS — G4731 Primary central sleep apnea: Secondary | ICD-10-CM | POA: Diagnosis not present

## 2019-01-13 ENCOUNTER — Ambulatory Visit (HOSPITAL_COMMUNITY): Payer: PPO | Attending: Cardiology

## 2019-01-13 ENCOUNTER — Other Ambulatory Visit: Payer: Self-pay

## 2019-01-13 DIAGNOSIS — I501 Left ventricular failure: Secondary | ICD-10-CM | POA: Diagnosis not present

## 2019-01-13 DIAGNOSIS — I10 Essential (primary) hypertension: Secondary | ICD-10-CM | POA: Diagnosis not present

## 2019-01-13 DIAGNOSIS — E039 Hypothyroidism, unspecified: Secondary | ICD-10-CM | POA: Diagnosis not present

## 2019-01-13 DIAGNOSIS — N183 Chronic kidney disease, stage 3 unspecified: Secondary | ICD-10-CM | POA: Diagnosis not present

## 2019-01-13 DIAGNOSIS — R600 Localized edema: Secondary | ICD-10-CM

## 2019-01-13 DIAGNOSIS — I5030 Unspecified diastolic (congestive) heart failure: Secondary | ICD-10-CM | POA: Diagnosis not present

## 2019-01-13 DIAGNOSIS — I2510 Atherosclerotic heart disease of native coronary artery without angina pectoris: Secondary | ICD-10-CM | POA: Diagnosis not present

## 2019-01-13 DIAGNOSIS — G4731 Primary central sleep apnea: Secondary | ICD-10-CM | POA: Diagnosis not present

## 2019-01-13 DIAGNOSIS — E7800 Pure hypercholesterolemia, unspecified: Secondary | ICD-10-CM | POA: Diagnosis not present

## 2019-01-13 DIAGNOSIS — R011 Cardiac murmur, unspecified: Secondary | ICD-10-CM | POA: Diagnosis not present

## 2019-01-13 DIAGNOSIS — I480 Paroxysmal atrial fibrillation: Secondary | ICD-10-CM | POA: Diagnosis not present

## 2019-01-16 ENCOUNTER — Telehealth: Payer: Self-pay | Admitting: Cardiology

## 2019-01-16 ENCOUNTER — Encounter: Payer: Self-pay | Admitting: Cardiology

## 2019-01-16 DIAGNOSIS — I350 Nonrheumatic aortic (valve) stenosis: Secondary | ICD-10-CM

## 2019-01-16 NOTE — Telephone Encounter (Signed)
Called with results of echo, spoke to patient's wife. Length of call 10 minutes.  Reviewed results of echocardiogram. Function not improved despite better heart rate control. Wife reports good days and bad days; today is a good day, but yesterday was a bad day with his energy level.  Discussed that I am concerned for possible severe aortic stenosis. Valve looks very tight to me, but due to low EF gradients are not significant.   I discussed with wife that if aortic valve is severely tight, it can cause symptoms. I also offered referral to one of my colleagues Nell Range, Dr. Burt Knack, and/or Dr. Angelena Form) to discuss possible TAVR. She would appreciate this referral.  He has a new CPAP mask, with improved fitting most nights but still some nights where the mask doesn't fit well. She has been working with the medical supply company on this.  All questions answered. Will refer for TAVR evaluation.  Buford Dresser, MD, PhD [facility]  [address], [place], [place] [address] [phone]

## 2019-02-13 DIAGNOSIS — G4731 Primary central sleep apnea: Secondary | ICD-10-CM | POA: Diagnosis not present

## 2019-02-16 ENCOUNTER — Other Ambulatory Visit: Payer: Self-pay | Admitting: Cardiology

## 2019-02-16 DIAGNOSIS — I5042 Chronic combined systolic (congestive) and diastolic (congestive) heart failure: Secondary | ICD-10-CM

## 2019-02-19 ENCOUNTER — Other Ambulatory Visit: Payer: Self-pay | Admitting: Cardiology

## 2019-02-19 DIAGNOSIS — Z961 Presence of intraocular lens: Secondary | ICD-10-CM | POA: Diagnosis not present

## 2019-02-19 DIAGNOSIS — I5042 Chronic combined systolic (congestive) and diastolic (congestive) heart failure: Secondary | ICD-10-CM

## 2019-02-19 MED ORDER — FUROSEMIDE 20 MG PO TABS: ORAL_TABLET | ORAL | 0 refills | Status: DC

## 2019-02-19 NOTE — Telephone Encounter (Signed)
New Message ° ° ° °*STAT* If patient is at the pharmacy, call can be transferred to refill team. ° ° °1. Which medications need to be refilled? (please list name of each medication and dose if known) furosemide (LASIX) 20 MG tablet  ° ° °2. Which pharmacy/location (including street and city if local pharmacy) is medication to be sent to? [id number] - [place], [place] - [address] ° °3. Do they need a 30 day or 90 day supply? 90 ° ° °

## 2019-02-19 NOTE — Telephone Encounter (Signed)
Requested Prescriptions   Signed Prescriptions Disp Refills  . furosemide (LASIX) 20 MG tablet 90 tablet 0    Sig: Take one 20 mg tab twice daily    Authorizing Provider: Buford Dresser    Ordering User: Raelene Bott, Tasneem Cormier L

## 2019-02-23 ENCOUNTER — Other Ambulatory Visit: Payer: Self-pay | Admitting: *Deleted

## 2019-02-23 DIAGNOSIS — J84112 Idiopathic pulmonary fibrosis: Secondary | ICD-10-CM

## 2019-02-26 ENCOUNTER — Telehealth: Payer: Self-pay | Admitting: Cardiology

## 2019-02-26 NOTE — Telephone Encounter (Signed)
° °  Pt c/o Shortness Of Breath: STAT if SOB developed within the last 24 hours or pt is noticeably SOB on the phone  1. Are you currently SOB (can you hear that pt is SOB on the phone)? no  2. How long have you been experiencing SOB? Since yesterday  3. Are you SOB when sitting or when up moving around? up and moving around  4. Are you currently experiencing any other symptoms? Weakness, Lightheaded, strong, pounding heartbeat, lack of appetite  Wife states that the patient started having the strong , pounding sensation yesterday. He was unable to get comfortable in bed, so he went to sit in his recliner. He has been in his recliner since. He is not SOB right now because he is using his BiPap machine. When he stops using the machine or gets up to walk across the room he gets SOB.   The wife wanted to know what she needs to do

## 2019-02-26 NOTE — Telephone Encounter (Signed)
Wife reports weight today is 179 lbs - yesterday was 174 lbs. He takes lasix 20 mg BID and has not taken any extra.  BP 92/73 ( wife says is normal for him), HR 102      Please advise, thanks

## 2019-02-27 NOTE — Telephone Encounter (Signed)
If he is short of breath or has swelling, I would take the lasix three times daily today instead of twice daily. If however he feels well, I would just monitor today and recheck weights tomorrow. He is always hypotensive, and I do not want to overdiurese him and make him dizzy/lightheaded from lowering his BP further. If his weight continues to rise, or if he feels worse, he should call the answering service over the weekend to see if he needs to come into the hospital. Thanks.

## 2019-02-27 NOTE — Telephone Encounter (Signed)
I spoke with wife and she states she is giving him three doses of lasix today and will re-weigh him tomorrow.She understands about overdiuresing him. She thanked everyone for caring for her husband.

## 2019-03-07 ENCOUNTER — Other Ambulatory Visit: Payer: Self-pay | Admitting: Cardiology

## 2019-03-07 DIAGNOSIS — I5042 Chronic combined systolic (congestive) and diastolic (congestive) heart failure: Secondary | ICD-10-CM

## 2019-03-10 DIAGNOSIS — I501 Left ventricular failure: Secondary | ICD-10-CM | POA: Diagnosis not present

## 2019-03-10 DIAGNOSIS — I5030 Unspecified diastolic (congestive) heart failure: Secondary | ICD-10-CM | POA: Diagnosis not present

## 2019-03-10 DIAGNOSIS — N183 Chronic kidney disease, stage 3 unspecified: Secondary | ICD-10-CM | POA: Diagnosis not present

## 2019-03-10 DIAGNOSIS — E7800 Pure hypercholesterolemia, unspecified: Secondary | ICD-10-CM | POA: Diagnosis not present

## 2019-03-10 DIAGNOSIS — I480 Paroxysmal atrial fibrillation: Secondary | ICD-10-CM | POA: Diagnosis not present

## 2019-03-10 DIAGNOSIS — E039 Hypothyroidism, unspecified: Secondary | ICD-10-CM | POA: Diagnosis not present

## 2019-03-10 DIAGNOSIS — I2510 Atherosclerotic heart disease of native coronary artery without angina pectoris: Secondary | ICD-10-CM | POA: Diagnosis not present

## 2019-03-10 DIAGNOSIS — I10 Essential (primary) hypertension: Secondary | ICD-10-CM | POA: Diagnosis not present

## 2019-03-10 DIAGNOSIS — I5022 Chronic systolic (congestive) heart failure: Secondary | ICD-10-CM | POA: Diagnosis not present

## 2019-03-15 DIAGNOSIS — G4731 Primary central sleep apnea: Secondary | ICD-10-CM | POA: Diagnosis not present

## 2019-03-19 ENCOUNTER — Encounter: Payer: Self-pay | Admitting: Cardiology

## 2019-03-19 DIAGNOSIS — I5022 Chronic systolic (congestive) heart failure: Secondary | ICD-10-CM | POA: Diagnosis not present

## 2019-03-19 DIAGNOSIS — I10 Essential (primary) hypertension: Secondary | ICD-10-CM | POA: Diagnosis not present

## 2019-03-19 DIAGNOSIS — R600 Localized edema: Secondary | ICD-10-CM | POA: Diagnosis not present

## 2019-03-19 DIAGNOSIS — E7800 Pure hypercholesterolemia, unspecified: Secondary | ICD-10-CM | POA: Diagnosis not present

## 2019-03-19 DIAGNOSIS — E039 Hypothyroidism, unspecified: Secondary | ICD-10-CM | POA: Diagnosis not present

## 2019-03-19 DIAGNOSIS — I480 Paroxysmal atrial fibrillation: Secondary | ICD-10-CM | POA: Diagnosis not present

## 2019-03-23 ENCOUNTER — Telehealth: Payer: Self-pay | Admitting: Internal Medicine

## 2019-03-23 NOTE — Telephone Encounter (Signed)
[name]  Last saw [name] in June 2020. Radiology thought sept 2020 CT had more CHF. So we set up PFT in feb 2021. He needs to see me after that. Pls give him followup in March 2021 - . 30 min ILD visit

## 2019-03-23 NOTE — Telephone Encounter (Signed)
MR's schedule is not available for March 2021. Recall has been placed. Nothing further needed.

## 2019-04-03 DIAGNOSIS — I5022 Chronic systolic (congestive) heart failure: Secondary | ICD-10-CM | POA: Diagnosis not present

## 2019-04-03 DIAGNOSIS — I480 Paroxysmal atrial fibrillation: Secondary | ICD-10-CM | POA: Diagnosis not present

## 2019-04-03 DIAGNOSIS — I501 Left ventricular failure: Secondary | ICD-10-CM | POA: Diagnosis not present

## 2019-04-03 DIAGNOSIS — I5030 Unspecified diastolic (congestive) heart failure: Secondary | ICD-10-CM | POA: Diagnosis not present

## 2019-04-03 DIAGNOSIS — I2510 Atherosclerotic heart disease of native coronary artery without angina pectoris: Secondary | ICD-10-CM | POA: Diagnosis not present

## 2019-04-03 DIAGNOSIS — E7800 Pure hypercholesterolemia, unspecified: Secondary | ICD-10-CM | POA: Diagnosis not present

## 2019-04-03 DIAGNOSIS — N183 Chronic kidney disease, stage 3 unspecified: Secondary | ICD-10-CM | POA: Diagnosis not present

## 2019-04-03 DIAGNOSIS — I10 Essential (primary) hypertension: Secondary | ICD-10-CM | POA: Diagnosis not present

## 2019-04-03 DIAGNOSIS — E039 Hypothyroidism, unspecified: Secondary | ICD-10-CM | POA: Diagnosis not present

## 2019-04-07 ENCOUNTER — Telehealth: Payer: PPO | Admitting: Cardiology

## 2019-04-08 ENCOUNTER — Telehealth (INDEPENDENT_AMBULATORY_CARE_PROVIDER_SITE_OTHER): Payer: PPO | Admitting: Cardiology

## 2019-04-08 ENCOUNTER — Encounter: Payer: Self-pay | Admitting: Cardiology

## 2019-04-08 ENCOUNTER — Telehealth: Payer: Self-pay | Admitting: *Deleted

## 2019-04-08 VITALS — BP 91/66 | HR 102 | Ht 68.0 in | Wt 180.0 lb

## 2019-04-08 DIAGNOSIS — I4821 Permanent atrial fibrillation: Secondary | ICD-10-CM

## 2019-04-08 DIAGNOSIS — I350 Nonrheumatic aortic (valve) stenosis: Secondary | ICD-10-CM

## 2019-04-08 DIAGNOSIS — I5042 Chronic combined systolic (congestive) and diastolic (congestive) heart failure: Secondary | ICD-10-CM

## 2019-04-08 DIAGNOSIS — Z712 Person consulting for explanation of examination or test findings: Secondary | ICD-10-CM

## 2019-04-08 DIAGNOSIS — Z7901 Long term (current) use of anticoagulants: Secondary | ICD-10-CM

## 2019-04-08 DIAGNOSIS — I2510 Atherosclerotic heart disease of native coronary artery without angina pectoris: Secondary | ICD-10-CM

## 2019-04-08 NOTE — Progress Notes (Signed)
Virtual Visit via Telephone Note   This visit type was conducted due to national recommendations for restrictions regarding the COVID-19 Pandemic (e.g. social distancing) in an effort to limit this patient's exposure and mitigate transmission in our community.  Due to his co-morbid illnesses, this patient is at least at moderate risk for complications without adequate follow up.  This format is felt to be most appropriate for this patient at this time.  The patient did not have access to video technology/had technical difficulties with video requiring transitioning to audio format only (telephone).  All issues noted in this document were discussed and addressed.  No physical exam could be performed with this format.  Please refer to the patient's chart for his  consent to telehealth for [facility].   Date:  04/08/2019   ID:  [name], DOB 03-Dec-1931, MRN [id number]  Patient Location: Home Provider Location: Office  PCP:  [name], MD  Cardiologist:  Buford Dresser, MD  Electrophysiologist:  None   Evaluation Performed:  Follow-Up Visit  Chief Complaint:  Follow up  History of Present Illness:    [name] is a 86 y.o. male with a hx of CAD s/p CABG, mitral valve repair, atrial fibrillation on anticoagulation with [facility]madin ([facility]), interstitial lung disease, chronic diastolic heart failure who is seen in follow up today for the evaluation and management of systolic and diastolic heart failure, atrial fibrillation. He follows at the [facility] in [place].   Cardiac history: He was discharged on 12/27/17 after hospitalization for acute on chronic diastolic heart failure and atrial fibrillation with rapid ventricular response. During his hospitalization, he had a new drop in his ejection fraction, consistent with new acute systolic heart failure in addition to his diastolic heart failure. He was diuresed >5 L, with weight change from 190 lbs to 179 lbs at  discharge. His hypotension had limited rate control agents in the past, and he was not on a beta blocker at the time of admission. He was titrated onto a low dose of beta blocker, limited by symptoms. His blood pressure was too low to add ACEI/ARB.   The patient does not have symptoms concerning for COVID-19 infection (fever, chills, cough, or new shortness of breath).   Today: Patient is hard of hearing, wife assisted with phone call today.  Fluid is much better. He had other issues as well, issues with walking, etc. Now taking 45m torsemide daily. Doing much better, not using wheelchair any more, ambulating more. Blood pressure is good, heart rate is stable for him.  Discussed tavr again for his severe aortic stenosis. They are amenable to referral, we had attempted this prior but does not appear it went through.  Had GI camera study, but pill lodged somewhere and ran out of battery before it passed. Has upcoming imaging study to image from esophagus to intestines. Reported indication is trouble swallowing/eating. Denies melena/hematochezia. Eating is better than before.  Sleep is good, using bipap.   Past Medical History:  Diagnosis Date  . Adenomatous colon polyp   . Aortic stenosis    a. mild by echo 2017.  .Marland KitchenCAD (coronary artery disease)    a. S/P CABG x 1 @ time of MV annuloplasty;  b. 02/2010 ETT: neg for ischemia.  . Carotid artery stenosis    1-39% by dopplers 10/2016  . Chronic diastolic CHF (congestive heart failure) ([place]   . CKD (chronic kidney disease), stage III   . Current use of  long term anticoagulation    a. Coumadin in setting of afib.  . Diverticulosis    Colonoscopy  . Dyslipidemia   . Essential hypertension   . H/O mitral valve repair   . Hemorrhoids   . Hyperlipidemia   . Hypertension   . Permanent atrial fibrillation (HCC)    a. CHA2DS2VASc = 6 -->chronic coumadin;  b. Prev on amio-->d/c 2/2 hypothyroidism. >> resumed 5/16. c. Notes from 2017 indicate  interstitial lung disease by pulm appt, question amio toxicity, also increased liver attenuation. Rate control pursued and amiodarone discontinued.  Marland Kitchen TIA (transient ischemic attack)    Past Surgical History:  Procedure Laterality Date  . CORONARY ARTERY BYPASS GRAFT    . MITRAL VALVE REPAIR       Current Meds  Medication Sig  . amoxicillin (AMOXIL) 500 MG tablet 500 mg. Take 2000 mg by mouth one hour prior to dental visits  . apixaban (ELIQUIS) 2.5 MG TABS tablet Take 2.5 mg by mouth 2 (two) times daily.  . Cholecalciferol (VITAMIN D-3) 1000 units CAPS Take by mouth daily.   . Cyanocobalamin (VITAMIN B 12 PO) Take by mouth.  . furosemide (LASIX) 20 MG tablet TAKE ONE TABLET BY MOUTH TWICE DAILY  . hydrocortisone (ANUSOL-HC) 2.5 % rectal cream Place 1 application rectally 2 (two) times daily. (Patient taking differently: Place 1 application rectally as needed. )  . levothyroxine (SYNTHROID) 75 MCG tablet Take 75 mcg by mouth at bedtime.   . metoprolol succinate (TOPROL-XL) 50 MG 24 hr tablet Take 1 tablet (50 mg total) by mouth daily.  . mirtazapine (REMERON) 15 MG tablet Take 0.5 tablets by mouth daily. 0.5 tablet at PM  . pravastatin (PRAVACHOL) 80 MG tablet Take 80 mg by mouth 3 (three) times a week. Mon, Wed, Fri     Allergies:   Asa [aspirin], Flomax [tamsulosin], and Pirfenidone   Social History   Tobacco Use  . Smoking status: Former Smoker    Packs/day: 0.10    Years: 2.00    Pack years: 0.20    Types: Cigarettes    Quit date: 03/20/1955    Years since quitting: 64.0  . Smokeless tobacco: Never Used  Substance Use Topics  . Alcohol use: No    Alcohol/week: 0.0 standard drinks  . Drug use: No     Family Hx: The patient's family history includes Heart disease in his father; Heart failure in his brother; Stroke in his brother and mother.  ROS:   Please see the history of present illness.    All other systems reviewed and are negative.   Prior CV studies:   The  following studies were reviewed today: Echo 01/13/19  1. Left ventricular ejection fraction, by visual estimation, is 20 to 25%. The left ventricle has severely decreased function. Moderate to severely increased left ventricular size. There is no left ventricular hypertrophy.  2. Left ventricular diastolic function could not be evaluated pattern of LV diastolic filling.  3. Global right ventricle has normal systolic function.The right ventricular size is mildly enlarged. No increase in right ventricular wall thickness.  4. Left atrial size was moderately dilated.  5. Right atrial size was moderately dilated.  6. Moderate calcification of the mitral valve leaflet(s).  7. Moderate thickening of the mitral valve leaflet(s).  8. Severely decreased mobility of the mitral valve leaflets.  9. The mitral valve has been repaired/replaced. Moderate to severe mitral valve regurgitation. 10. The tricuspid valve is normal in structure. Tricuspid valve regurgitation is  mild. 11. The aortic valve is abnormal Aortic valve regurgitation is mild by color flow Doppler. 12. There is Severe calcifcation of the aortic valve. 13. There is Severely thickening of the aortic valve. 14. Suspect low flow-low gradient AS given visual limited movement of aortic valve and severely reduced LV EF. 15. The pulmonic valve was grossly normal. Pulmonic valve regurgitation is mild to moderate by color flow Doppler. 16. Moderately elevated pulmonary artery systolic pressure. 17. The inferior vena cava is normal in size with <50% respiratory variability, suggesting right atrial pressure of 8 mmHg. 18. While heart rate much improved on this study, side by side comparison shows dilation of the LV and stable severely reduced LV EF. The aortic valve is severely thickened/calcified and visually appears to minimally open. Suspect low flow low gradient  severe AS, as dimensionless index 0.18. Prior MV repair with immobile posterior mitral  valve leaflet.  Labs/Other Tests and Data Reviewed:    EKG:  An ECG dated 01/05/19 was personally reviewed today and demonstrated:  afib  Recent Labs: 01/05/2019: BUN 33; Creatinine, Ser 1.78; Potassium 5.1; Sodium 141   Recent Lipid Panel Lab Results  Component Value Date/Time   CHOL 193 06/17/2017 07:44 AM   TRIG 114 06/17/2017 07:44 AM   HDL 35 (L) 06/17/2017 07:44 AM   CHOLHDL 5.5 (H) 06/17/2017 07:44 AM   CHOLHDL 2.6 03/16/2016 09:03 AM   LDLCALC 135 (H) 06/17/2017 07:44 AM    Wt Readings from Last 3 Encounters:  04/08/19 180 lb (81.6 kg)  01/05/19 175 lb 9.6 oz (79.7 kg)  09/11/18 183 lb 3.2 oz (83.1 kg)     Objective:    Vital Signs:  BP 91/66   Pulse (!) 102   Ht 5' 8"  (1.727 m)   Wt 180 lb (81.6 kg)   BMI 27.37 kg/m    No physical exam over the phone.  ASSESSMENT & PLAN:    Chronic systolicand diastolicheart failuure:systolic failure diagnosed in 12/2017 -weight has been stable on current regimen -reports doing well on torsemide 80 mg dose -continue metoprolol succinate -no room for ACEi/ARB/ARNI/MRA  Fatigue: unclear etiology, intermittent. May be due to heart failure, afib, lung disease, or other -continue to monitor, undergoing GI evaluation as well  Atrial Fibrillation: history of difficult to control RVR, was going 160 bpm with minimal exertion when I met him. We have aimed for HR to be <562 and systolic BP to be >563, but this has been intermittently difficult -tolerating metoprolol succinate 50 mg daily, increased dose leads to worsening hypotension -not controlled on amiodarone in the past (and with liver/lung pathology, would avoid), no diltiazem given low EF, declines digoxin after shared decision making.  -tolerating apixaban 2.5 mg BID (reduced for age, Cr) -CHA2DS2/VAS Stroke Risk Points=5   sleep apnea events, pulm disease: -follows with pulmonology, stopped Esbriet for ILD/IPF -uses Bipap  History of CKD, stage 4  CAD:  remote h/o CABG. -no chest pain -on pravastatin (see below), no aspirin given apixaban for afib  Hyperlipidemia: -Goal LDL <70, previously LDL 135 in 2019 (not at goal). Followed through [facility] as well -On pravastatin 80 mg. We have discussed intensifying in the past, but given age/comorbidities/lack of symptoms have left alone.  Mitral Regurgitation: h/o MV repair. Now moderate to severe, had been mild on last echo.  Aortic Stenosis: mild AS noted on prior echo in 2017. However,  most recent echo with high suspicion for low-flow low-gradient AS. Given his multiple comorbidities, I do not think  he is a candidate for SAVR. I am uncertain as to whether TAVR would be an option for him. I will ask my colleagues in the structural heart clinic for their assessment. Reviewed the echo over the phone today.  COVID-19 Education: The signs and symptoms of COVID-19 were discussed with the patient and how to seek care for testing (follow up with PCP or arrange E-visit).  The importance of social distancing was discussed today.  Follow up in 3 mos  Time:   Today, I have spent 21 minutes with the patient with telehealth technology discussing the above problems. Total visit time 31 minutes today, including chart review and documentation.   Patient Instructions   Medication Instructions:  NO CHANGES, CONTINUE CURRENT MEDICATIONS *If you need a refill on your cardiac medications before your next appointment, please call your pharmacy*  Lab Work: NONE If you have labs (blood work) drawn today and your tests are completely normal, you will receive your results only by: Marland Kitchen MyChart Message (if you have MyChart) OR . A paper copy in the mail If you have any lab test that is abnormal or we need to change your treatment, we will call you to review the results.   Follow-Up: At [facility], you and your health needs are our priority.  As part of our continuing mission to provide you with exceptional heart  care, we have created designated Provider Care Teams.  These Care Teams include your primary Cardiologist (physician) and Advanced Practice Providers (APPs -  Physician Assistants and Nurse Practitioners) who all work together to provide you with the care you need, when you need it.  Your next appointment:   3 month(s) CALL THE [place] OFFICE TO SCHEDULE THIS   The format for your next appointment:   In Person  Provider:   Buford Dresser, MD  Other Instructions [place] AN APPOINTMENT    Signed, Buford Dresser, MD  04/08/2019  [facility]

## 2019-04-08 NOTE — Patient Instructions (Signed)
  Medication Instructions:  NO CHANGES, CONTINUE CURRENT MEDICATIONS *If you need a refill on your cardiac medications before your next appointment, please call your pharmacy*  Lab Work: NONE If you have labs (blood work) drawn today and your tests are completely normal, you will receive your results only by: Marland Kitchen MyChart Message (if you have MyChart) OR . A paper copy in the mail If you have any lab test that is abnormal or we need to change your treatment, we will call you to review the results.   Follow-Up: At [facility], you and your health needs are our priority.  As part of our continuing mission to provide you with exceptional heart care, we have created designated Provider Care Teams.  These Care Teams include your primary Cardiologist (physician) and Advanced Practice Providers (APPs -  Physician Assistants and Nurse Practitioners) who all work together to provide you with the care you need, when you need it.  Your next appointment:   3 month(s) [place] OFFICE TO SCHEDULE THIS   The format for your next appointment:   In Person  Provider:   Buford Dresser, MD  Other Instructions REFERRAL PLACED FOR TAVER EVAL THEY WILL CALL YOU TO SCHEDULE AN APPOINTMENT

## 2019-04-08 NOTE — Telephone Encounter (Signed)
Left message for patient to call and schedule 3 months follow up appointment with Dr. Harrell Gave

## 2019-04-09 ENCOUNTER — Encounter: Payer: Self-pay | Admitting: Cardiothoracic Surgery

## 2019-04-12 ENCOUNTER — Encounter: Payer: Self-pay | Admitting: Cardiology

## 2019-04-15 DIAGNOSIS — G4731 Primary central sleep apnea: Secondary | ICD-10-CM | POA: Diagnosis not present

## 2019-04-17 ENCOUNTER — Other Ambulatory Visit (HOSPITAL_COMMUNITY)
Admission: RE | Admit: 2019-04-17 | Discharge: 2019-04-17 | Disposition: A | Discharge status: Home / Self Care | Payer: PPO | Source: Ambulatory Visit | Attending: Internal Medicine | Admitting: Internal Medicine

## 2019-04-17 DIAGNOSIS — Z01812 Encounter for preprocedural laboratory examination: Secondary | ICD-10-CM | POA: Insufficient documentation

## 2019-04-17 DIAGNOSIS — Z20822 Contact with and (suspected) exposure to covid-19: Secondary | ICD-10-CM | POA: Insufficient documentation

## 2019-04-17 LAB — SARS CORONAVIRUS 2 (TAT 6-24 HRS): SARS Coronavirus 2: NEGATIVE

## 2019-04-21 ENCOUNTER — Ambulatory Visit (INDEPENDENT_AMBULATORY_CARE_PROVIDER_SITE_OTHER): Payer: PPO | Admitting: Internal Medicine

## 2019-04-21 ENCOUNTER — Other Ambulatory Visit: Payer: Self-pay

## 2019-04-21 DIAGNOSIS — J84112 Idiopathic pulmonary fibrosis: Secondary | ICD-10-CM

## 2019-04-21 LAB — PULMONARY FUNCTION TEST
DL/VA % pred: 91 %
DL/VA: 3.49 ml/min/mmHg/L
DLCO unc % pred: 66 %
DLCO unc: 15.32 ml/min/mmHg
FEF 25-75 Pre: 2.55 L/sec
FEF2575-%Pred-Pre: 155 %
FEV1-%Pred-Pre: 95 %
FEV1-Pre: 2.43 L
FEV1FVC-%Pred-Pre: 117 %
FEV6-%Pred-Pre: 87 %
FEV6-Pre: 2.93 L
FEV6FVC-%Pred-Pre: 108 %
FVC-%Pred-Pre: 80 %
FVC-Pre: 2.93 L
Pre FEV1/FVC ratio: 83 %
Pre FEV6/FVC Ratio: 100 %

## 2019-04-21 NOTE — Progress Notes (Signed)
Spiro/DLCO performed today. 

## 2019-04-24 ENCOUNTER — Encounter: Payer: Self-pay | Admitting: Cardiovascular Disease

## 2019-04-24 ENCOUNTER — Other Ambulatory Visit: Payer: Self-pay

## 2019-04-24 ENCOUNTER — Other Ambulatory Visit (HOSPITAL_COMMUNITY)
Admission: RE | Admit: 2019-04-24 | Discharge: 2019-04-24 | Disposition: A | Discharge status: Home / Self Care | Payer: PPO | Source: Ambulatory Visit | Attending: Cardiovascular Disease | Admitting: Cardiovascular Disease

## 2019-04-24 ENCOUNTER — Ambulatory Visit: Payer: PPO | Admitting: Cardiovascular Disease

## 2019-04-24 VITALS — BP 98/62 | HR 84 | Ht 68.0 in | Wt 162.2 lb

## 2019-04-24 DIAGNOSIS — Z20822 Contact with and (suspected) exposure to covid-19: Secondary | ICD-10-CM | POA: Insufficient documentation

## 2019-04-24 DIAGNOSIS — I350 Nonrheumatic aortic (valve) stenosis: Secondary | ICD-10-CM | POA: Diagnosis not present

## 2019-04-24 DIAGNOSIS — Z01812 Encounter for preprocedural laboratory examination: Secondary | ICD-10-CM | POA: Insufficient documentation

## 2019-04-24 LAB — SARS CORONAVIRUS 2 (TAT 6-24 HRS): SARS Coronavirus 2: NEGATIVE

## 2019-04-24 NOTE — Progress Notes (Signed)
[facility]   MULTIDISCIPLINARY HEART VALVE TEAM  Date:  04/26/2019   ID:  [name], DOB [birth year], MRN [id number]  PCP:  [name], MD   Chief Complaint  Patient presents with  . Shortness of Breath     HISTORY OF PRESENT ILLNESS: [name] is a 86 y.o. male who presents for evaluation of aortic stenosis and mitral regurgitation, referred by Dr Harrell Gave.  The patient is here with his wife today.  He has a fairly complex history with remote CABG, mitral valve repair, and chronic anticoagulation with warfarin for permanent atrial fibrillation.  The patient had done fairly well until he was hospitalized in 2019 with rapid atrial fibrillation and acute on chronic heart failure.  At that time he was noted to have new LV systolic dysfunction.  His heart rate has been difficult to control over time.  He had previously been treated with amiodarone but developed hypothyroidism and later developed symptoms and radiographic findings concerning for interstitial lung disease with amiodarone toxicity.  Amiodarone was discontinued in 2017.  The patient has been noted to have mild aortic stenosis in the past.  More recent echo studies have suggested severe low-flow low gradient aortic stenosis as visually the aortic valve is severely calcified and restricted with minimal leaflet mobility.  Peak and mean transaortic gradients were only 21 and 12 mmHg, respectively.  However, the aortic valve dimensionless index was 0.18 and calculated aortic valve area 0.8 cm.  LV function remains severely depressed with LVEF 25% and there is moderate to severe mitral regurgitation, appearing more significant than past studies.  From a symptomatic perspective, the patient reports fatigue and exertional dyspnea.  He currently denies orthopnea, PND, lightheadedness, or syncope.  He has occasional chest pain but no consistent symptoms with exertion.  He is more sedentary than in the past.  The  patient's diuretics were increased and he actually is feeling much better than in the past.  His wife also notes that his heart rate has been better controlled.  He is not limited by problems with arthritis.  The patient frequently runs a blood pressure less than 100 mmHg and has not been a candidate for escalation of his heart failure medical therapy or rate controlling drugs because of low blood pressure.  Past Medical History:  Diagnosis Date  . Adenomatous colon polyp   . Aortic stenosis    a. mild by echo 2017.  Marland Kitchen CAD (coronary artery disease)    a. S/P CABG x 1 @ time of MV annuloplasty;  b. 02/2010 ETT: neg for ischemia.  . Carotid artery stenosis    1-39% by dopplers 10/2016  . Chronic diastolic CHF (congestive heart failure) ([place])   . CKD (chronic kidney disease), stage III   . Current use of long term anticoagulation    a. Coumadin in setting of afib.  . Diverticulosis    Colonoscopy  . Dyslipidemia   . Essential hypertension   . H/O mitral valve repair   . Hemorrhoids   . Hyperlipidemia   . Hypertension   . Permanent atrial fibrillation (HCC)    a. CHA2DS2VASc = 6 -->chronic coumadin;  b. Prev on amio-->d/c 2/2 hypothyroidism. >> resumed 5/16. c. Notes from 2017 indicate interstitial lung disease by pulm appt, question amio toxicity, also increased liver attenuation. Rate control pursued and amiodarone discontinued.  Marland Kitchen TIA (transient ischemic attack)     Current Outpatient Medications  Medication Sig Dispense Refill  . amoxicillin (AMOXIL) 500  MG tablet 500 mg. Take 2000 mg by mouth one hour prior to dental visits    . apixaban (ELIQUIS) 2.5 MG TABS tablet Take 2.5 mg by mouth 2 (two) times daily.    . Cholecalciferol (VITAMIN D-3) 1000 units CAPS Take by mouth daily.     . Cyanocobalamin (VITAMIN B 12 PO) Take by mouth.    . docusate sodium (COLACE) 100 MG capsule Take 100 mg by mouth 2 (two) times daily.    . hydrocortisone (ANUSOL-HC) 2.5 % rectal cream Place 1  application rectally 2 (two) times daily. 30 g 1  . levothyroxine (SYNTHROID) 75 MCG tablet Take 75 mcg by mouth at bedtime.     . metoprolol succinate (TOPROL-XL) 50 MG 24 hr tablet Take 1 tablet (50 mg total) by mouth daily. 90 tablet 3  . mirtazapine (REMERON) 15 MG tablet Take 0.5 tablets by mouth daily. 0.5 tablet at PM    . pravastatin (PRAVACHOL) 80 MG tablet Take 80 mg by mouth 3 (three) times a week. Mon, Wed, Fri    . torsemide (DEMADEX) 20 MG tablet Take 80 mg by mouth daily.     No current facility-administered medications for this visit.    ALLERGIES:   Asa [aspirin], Flomax [tamsulosin], and Pirfenidone   SOCIAL HISTORY:  The patient  reports that he quit smoking about 64 years ago. His smoking use included cigarettes. He has a 0.20 pack-year smoking history. He has never used smokeless tobacco. He reports that he does not drink alcohol or use drugs.   FAMILY HISTORY:  The patient's family history includes Heart disease in his father; Heart failure in his brother; Stroke in his brother and mother.   REVIEW OF SYSTEMS:  Positive for nocturia, urinary frequency.   All other systems are reviewed and negative.   PHYSICAL EXAM: VS:  BP 98/62   Pulse 84   Ht 5\' 8"  (1.727 m)   Wt 162 lb 3.2 oz (73.6 kg)   SpO2 99%   BMI 24.66 kg/m  , BMI Body mass index is 24.66 kg/m. GEN: Pleasant elderly male, in no acute distress HEENT: normal Neck: No JVD. carotids 2+ without bruits or masses Cardiac: The heart is RRR with somewhat distant heart sounds, 3/6 harsh systolic murmur at the RUSB. 1+ bilateral ankle edema. Respiratory:  clear to auscultation bilaterally GI: soft, nontender, nondistended, + BS MS: no deformity or atrophy Skin: warm and dry, no rash Neuro:  Strength and sensation are intact Psych: euthymic mood, full affect  EKG:  EKG from 01-05-2019 reviewed and demonstrates atrial fibrillation with nonspecific intraventricular conduction delay  RECENT LABS: 04/24/2019: BUN  44; Creatinine, Ser 2.10; Hemoglobin 13.6; Platelets 88; Potassium 3.4; Sodium 141  No results found for requested labs within last 8760 hours.   Estimated Creatinine Clearance: 24.9 mL/min (A) (by C-G formula based on SCr of 2.1 mg/dL (H)).   Wt Readings from Last 3 Encounters:  04/24/19 162 lb 3.2 oz (73.6 kg)  04/08/19 180 lb (81.6 kg)  01/05/19 175 lb 9.6 oz (79.7 kg)     CARDIAC STUDIES: Echo 01-13-2019: IMPRESSIONS    1. Left ventricular ejection fraction, by visual estimation, is 20 to  25%. The left ventricle has severely decreased function. Moderate to  severely increased left ventricular size. There is no left ventricular  hypertrophy.  2. Left ventricular diastolic function could not be evaluated pattern of  LV diastolic filling.  3. Global right ventricle has normal systolic function.The right  ventricular size  is mildly enlarged. No increase in right ventricular wall  thickness.  4. Left atrial size was moderately dilated.  5. Right atrial size was moderately dilated.  6. Moderate calcification of the mitral valve leaflet(s).  7. Moderate thickening of the mitral valve leaflet(s).  8. Severely decreased mobility of the mitral valve leaflets.  9. The mitral valve has been repaired/replaced. Moderate to severe mitral  valve regurgitation.  10. The tricuspid valve is normal in structure. Tricuspid valve  regurgitation is mild.  11. The aortic valve is abnormal Aortic valve regurgitation is mild by  color flow Doppler.  12. There is Severe calcifcation of the aortic valve.  13. There is Severely thickening of the aortic valve.  14. Suspect low flow-low gradient AS given visual limited movement of  aortic valve and severely reduced LV EF.  15. The pulmonic valve was grossly normal. Pulmonic valve regurgitation is  mild to moderate by color flow Doppler.  16. Moderately elevated pulmonary artery systolic pressure.  17. The inferior vena cava is normal in  size with <50% respiratory  variability, suggesting right atrial pressure of 8 mmHg.  18. While heart rate much improved on this study, side by side comparison  shows dilation of the LV and stable severely reduced LV EF. The aortic  valve is severely thickened/calcified and visually appears to minimally  open. Suspect low flow low gradient  severe AS, as dimensionless index 0.18. Prior MV repair with immobile  posterior mitral valve leaflet.   FINDINGS  Left Ventricle: Left ventricular ejection fraction, by visual estimation,  is 20 to 25%. The left ventricle has severely decreased function. There is  no left ventricular hypertrophy. Moderate to severely increased left  ventricular size. Spectral Doppler  shows Left ventricular diastolic function could not be evaluated pattern  of LV diastolic filling.   Right Ventricle: The right ventricular size is mildly enlarged. No  increase in right ventricular wall thickness. Global RV systolic function  is has normal systolic function. The tricuspid regurgitant velocity is  3.04 m/s, and with an assumed right atrial  pressure of 8 mmHg, the estimated right ventricular systolic pressure is  moderately elevated at 44.9 mmHg.   Left Atrium: Left atrial size was moderately dilated.   Right Atrium: Right atrial size was moderately dilated   Pericardium: There is no evidence of pericardial effusion.   Mitral Valve: The mitral valve has been repaired/replaced. There is  moderate thickening of the mitral valve leaflet(s). There is moderate  calcification of the mitral valve leaflet(s). Severely decreased mobility  of the mitral valve leaflets. Moderate to  severe mitral valve regurgitation. Fixed posterior MV leaflet.   Tricuspid Valve: The tricuspid valve is normal in structure. Tricuspid  valve regurgitation is mild by color flow Doppler.   Aortic Valve: The aortic valve is abnormal. There is Severely thickening  and Severe calcifcation of  the aortic valve. Aortic valve regurgitation is  mild by color flow Doppler. There is Severely thickening of the aortic  valve. Severe calcifcation. Aortic  valve mean gradient measures 12.2 mmHg. Aortic valve peak gradient  measures 21.7 mmHg. Aortic valve area, by VTI measures 0.83 cm. Suspect  low flow-low gradient AS given visual limited movement of aortic valve and  severely reduced LV EF.   Pulmonic Valve: The pulmonic valve was grossly normal. Pulmonic valve  regurgitation is mild to moderate by color flow Doppler.   Aorta: The aortic root and ascending aorta are structurally normal, with  no evidence of  dilitation.   Venous: The inferior vena cava is normal in size with less than 50%  respiratory variability, suggesting right atrial pressure of 8 mmHg.   IAS/Shunts: No atrial level shunt detected by color flow Doppler.      LEFT VENTRICLE  PLAX 2D  LVIDd:     6.10 cm  LVIDs:     5.20 cm  LV PW:     1.10 cm  LV IVS:    0.90 cm  LVOT diam:   2.40 cm  LV SV:     57 ml  LV SV Index:  29.16  LVOT Area:   4.52 cm     RIGHT VENTRICLE      IVC  RVSP:      39.9 mmHg IVC diam: 2.00 cm   LEFT ATRIUM       Index    RIGHT ATRIUM      Index  LA diam:    4.50 cm 2.33 cm/m RA Pressure: 3.00 mmHg  LA Vol (A2C):  85.3 ml 44.11 ml/m RA Area:   21.20 cm  LA Vol (A4C):  91.5 ml 47.32 ml/m RA Volume:  71.60 ml 37.03 ml/m  LA Biplane Vol: 96.1 ml 49.69 ml/m  AORTIC VALVE  AV Area (Vmax):  0.77 cm  AV Area (Vmean):  0.78 cm  AV Area (VTI):   0.83 cm  AV Vmax:      232.80 cm/s  AV Vmean:     161.000 cm/s  AV VTI:      0.419 m  AV Peak Grad:   21.7 mmHg  AV Mean Grad:   12.2 mmHg  LVOT Vmax:     39.66 cm/s  LVOT Vmean:    27.780 cm/s  LVOT VTI:     0.077 m  LVOT/AV VTI ratio: 0.18    AORTA  Ao Root diam: 3.10 cm  Ao Asc diam: 3.20 cm   MITRAL VALVE             TRICUSPID VALVE  MV Area (PHT):           TR Peak grad:  36.9 mmHg                   TR Vmax:    329.00 cm/s  MV Decel Time: 190 msec       Estimated RAP: 3.00 mmHg  MV E velocity: 168.20 cm/s 103 cm/s RVSP:      39.9 mmHg                     SHUNTS                   Systemic VTI: 0.08 m                   Systemic Diam: 2.40 cm   STS RISK CALCULATOR: Not able to calculate for aortic/mitral valve replacement  ASSESSMENT AND PLAN: 1.  Severe, Stage D2 (low flow low gradient), aortic stenosis.   I have reviewed the natural history of aortic stenosis with the patient and their family members who are present today. We have discussed the limitations of medical therapy and the poor prognosis associated with symptomatic aortic stenosis. We have reviewed potential treatment options, including palliative medical therapy, conventional surgical aortic valve replacement, and transcatheter aortic valve replacement. We discussed treatment options in the context of the patient's specific comorbid medical conditions.  I have personally reviewed extensive records from the inpatient and outpatient  setting.  He has not been hospitalized since 2019 when he was first noted to have severe LV dysfunction.  Potential causes of LV dysfunction include valvular heart disease, progressive ischemic disease, and most likely tachycardia mediated with difficult to control atrial fibrillation.  I have personally reviewed the patient's echo images which are discussed in the HPI.  I agree the patient has severe LV dysfunction, severe calcification and restriction of the aortic valve leaflets with a low dimensionless index of 0.18, suspicious for low-flow low gradient aortic stenosis especially in the setting of moderately severe mitral insufficiency.  The patient has significant comorbid conditions including advanced  age, stage IIIb chronic kidney disease, thrombocytopenia, interstitial lung disease, and chronic systolic heart failure.  It seems appropriate to evaluate him for TAVR.  This would include right and left heart catheterization with limited contrast in the setting of his chronic kidney disease.  He would need CTA studies as well.  Once his evaluation is completed, he will be referred for cardiac surgical evaluation as part of a multidisciplinary approach to his care.  I do not think he would be a candidate for conventional surgery under any circumstances because of comorbid conditions outlined above.  The question would be whether he would be a candidate for TAVR to treat low flow low gradient aortic stenosis.  If so, it might be reasonable to follow his mitral regurgitation and only consider transcatheter mitral valve therapies such as valve in ring if he continues to exhibit symptoms of heart failure and does not show improvement in mitral regurgitation following TAVR.  I have reviewed the risks, indications, and alternatives to cardiac catheterization, possible angioplasty, and stenting with the patient. Risks include but are not limited to bleeding, infection, vascular injury, stroke, myocardial infection, arrhythmia, kidney injury, radiation-related injury in the case of prolonged fluoroscopy use, emergency cardiac surgery, and death. The patient understands the risks of serious complication is 1-2 in 1287 with diagnostic cardiac cath and 1-2% or less with angioplasty/stenting.   Deatra James 04/26/2019 10:05 AM     [facility] [address] [place] [address]  [phone] (office) [phone] (fax)

## 2019-04-24 NOTE — Patient Instructions (Addendum)
COVID SCREENING INFORMATION (04/24/2019): You are scheduled for your COVID screening TODAY! [facility]) [address] Stay in the RIGHT lane and proceed under the brick awning and tell them you are there for pre-procedure testing Do NOT bring any pets with you to the testing site   CATHETERIZATION INSTRUCTIONS (04/28/2019): You are scheduled for a Cardiac Catheterization on: 04/28/2019  1. Please arrive at the [facility] (Main Entrance A) at [facility]: [address] [place], [place] [address] at: 5:30AM You are allowed ONE visitor in the waiting room during your procedure. Both you and your visitor must wear masks. Free valet parking service is available.  Special note: Every effort is made to have your procedure done on time. Please understand that emergencies sometimes delay scheduled procedures.  2. Diet: Do not eat solid foods after midnight.  You may have clear liquids until 5am upon the day of the procedure.  3. Labs: Pre-procedure labs will be drawn TODAY.  4. Medication instructions in preparation for your procedure:  1) HOLD ELIQUIS 48 HOURS prior to your procedure (last dose is 2/6 PM tablet)  2) HOLD DEMADEX the night before and morning of your cath  3) TAKE ASPIRIN 81 mg the morning of your procedure  4) You may take your other medications as directed with sips of water.  5. Plan for one night stay--bring personal belongings. 6. Bring a current list of your medications and current insurance cards. 7. You MUST have a responsible person to drive you home. 8. Someone MUST be with you the first 24 hours after you arrive home or your discharge will be delayed. 9. Please wear clothes that are easy to get on and off and wear slip-on shoes.  Thank you for allowing [place] to care for you!   -- [facility]

## 2019-04-24 NOTE — H&P (View-Only) (Signed)
[facility]   MULTIDISCIPLINARY HEART VALVE TEAM  Date:  04/26/2019   ID:  [name], DOB [birth year], MRN [id number]  PCP:  [name], MD   Chief Complaint  Patient presents with  . Shortness of Breath     HISTORY OF PRESENT ILLNESS: [name] is a 86 y.o. male who presents for evaluation of aortic stenosis and mitral regurgitation, referred by Dr Harrell Gave.  The patient is here with his wife today.  He has a fairly complex history with remote CABG, mitral valve repair, and chronic anticoagulation with warfarin for permanent atrial fibrillation.  The patient had done fairly well until he was hospitalized in 2019 with rapid atrial fibrillation and acute on chronic heart failure.  At that time he was noted to have new LV systolic dysfunction.  His heart rate has been difficult to control over time.  He had previously been treated with amiodarone but developed hypothyroidism and later developed symptoms and radiographic findings concerning for interstitial lung disease with amiodarone toxicity.  Amiodarone was discontinued in 2017.  The patient has been noted to have mild aortic stenosis in the past.  More recent echo studies have suggested severe low-flow low gradient aortic stenosis as visually the aortic valve is severely calcified and restricted with minimal leaflet mobility.  Peak and mean transaortic gradients were only 21 and 12 mmHg, respectively.  However, the aortic valve dimensionless index was 0.18 and calculated aortic valve area 0.8 cm.  LV function remains severely depressed with LVEF 25% and there is moderate to severe mitral regurgitation, appearing more significant than past studies.  From a symptomatic perspective, the patient reports fatigue and exertional dyspnea.  He currently denies orthopnea, PND, lightheadedness, or syncope.  He has occasional chest pain but no consistent symptoms with exertion.  He is more sedentary than in the past.  The  patient's diuretics were increased and he actually is feeling much better than in the past.  His wife also notes that his heart rate has been better controlled.  He is not limited by problems with arthritis.  The patient frequently runs a blood pressure less than 100 mmHg and has not been a candidate for escalation of his heart failure medical therapy or rate controlling drugs because of low blood pressure.  Past Medical History:  Diagnosis Date  . Adenomatous colon polyp   . Aortic stenosis    a. mild by echo 2017.  Marland Kitchen CAD (coronary artery disease)    a. S/P CABG x 1 @ time of MV annuloplasty;  b. 02/2010 ETT: neg for ischemia.  . Carotid artery stenosis    1-39% by dopplers 10/2016  . Chronic diastolic CHF (congestive heart failure) ([place])   . CKD (chronic kidney disease), stage III   . Current use of long term anticoagulation    a. Coumadin in setting of afib.  . Diverticulosis    Colonoscopy  . Dyslipidemia   . Essential hypertension   . H/O mitral valve repair   . Hemorrhoids   . Hyperlipidemia   . Hypertension   . Permanent atrial fibrillation (HCC)    a. CHA2DS2VASc = 6 -->chronic coumadin;  b. Prev on amio-->d/c 2/2 hypothyroidism. >> resumed 5/16. c. Notes from 2017 indicate interstitial lung disease by pulm appt, question amio toxicity, also increased liver attenuation. Rate control pursued and amiodarone discontinued.  Marland Kitchen TIA (transient ischemic attack)     Current Outpatient Medications  Medication Sig Dispense Refill  . amoxicillin (AMOXIL) 500  MG tablet 500 mg. Take 2000 mg by mouth one hour prior to dental visits    . apixaban (ELIQUIS) 2.5 MG TABS tablet Take 2.5 mg by mouth 2 (two) times daily.    . Cholecalciferol (VITAMIN D-3) 1000 units CAPS Take by mouth daily.     . Cyanocobalamin (VITAMIN B 12 PO) Take by mouth.    . docusate sodium (COLACE) 100 MG capsule Take 100 mg by mouth 2 (two) times daily.    . hydrocortisone (ANUSOL-HC) 2.5 % rectal cream Place 1  application rectally 2 (two) times daily. 30 g 1  . levothyroxine (SYNTHROID) 75 MCG tablet Take 75 mcg by mouth at bedtime.     . metoprolol succinate (TOPROL-XL) 50 MG 24 hr tablet Take 1 tablet (50 mg total) by mouth daily. 90 tablet 3  . mirtazapine (REMERON) 15 MG tablet Take 0.5 tablets by mouth daily. 0.5 tablet at PM    . pravastatin (PRAVACHOL) 80 MG tablet Take 80 mg by mouth 3 (three) times a week. Mon, Wed, Fri    . torsemide (DEMADEX) 20 MG tablet Take 80 mg by mouth daily.     No current facility-administered medications for this visit.    ALLERGIES:   Asa [aspirin], Flomax [tamsulosin], and Pirfenidone   SOCIAL HISTORY:  The patient  reports that he quit smoking about 64 years ago. His smoking use included cigarettes. He has a 0.20 pack-year smoking history. He has never used smokeless tobacco. He reports that he does not drink alcohol or use drugs.   FAMILY HISTORY:  The patient's family history includes Heart disease in his father; Heart failure in his brother; Stroke in his brother and mother.   REVIEW OF SYSTEMS:  Positive for nocturia, urinary frequency.   All other systems are reviewed and negative.   PHYSICAL EXAM: VS:  BP 98/62   Pulse 84   Ht 5\' 8"  (1.727 m)   Wt 162 lb 3.2 oz (73.6 kg)   SpO2 99%   BMI 24.66 kg/m  , BMI Body mass index is 24.66 kg/m. GEN: Pleasant elderly male, in no acute distress HEENT: normal Neck: No JVD. carotids 2+ without bruits or masses Cardiac: The heart is RRR with somewhat distant heart sounds, 3/6 harsh systolic murmur at the RUSB. 1+ bilateral ankle edema. Respiratory:  clear to auscultation bilaterally GI: soft, nontender, nondistended, + BS MS: no deformity or atrophy Skin: warm and dry, no rash Neuro:  Strength and sensation are intact Psych: euthymic mood, full affect  EKG:  EKG from 01-05-2019 reviewed and demonstrates atrial fibrillation with nonspecific intraventricular conduction delay  RECENT LABS: 04/24/2019: BUN  44; Creatinine, Ser 2.10; Hemoglobin 13.6; Platelets 88; Potassium 3.4; Sodium 141  No results found for requested labs within last 8760 hours.   Estimated Creatinine Clearance: 24.9 mL/min (A) (by C-G formula based on SCr of 2.1 mg/dL (H)).   Wt Readings from Last 3 Encounters:  04/24/19 162 lb 3.2 oz (73.6 kg)  04/08/19 180 lb (81.6 kg)  01/05/19 175 lb 9.6 oz (79.7 kg)     CARDIAC STUDIES: Echo 01-13-2019: IMPRESSIONS    1. Left ventricular ejection fraction, by visual estimation, is 20 to  25%. The left ventricle has severely decreased function. Moderate to  severely increased left ventricular size. There is no left ventricular  hypertrophy.  2. Left ventricular diastolic function could not be evaluated pattern of  LV diastolic filling.  3. Global right ventricle has normal systolic function.The right  ventricular size  is mildly enlarged. No increase in right ventricular wall  thickness.  4. Left atrial size was moderately dilated.  5. Right atrial size was moderately dilated.  6. Moderate calcification of the mitral valve leaflet(s).  7. Moderate thickening of the mitral valve leaflet(s).  8. Severely decreased mobility of the mitral valve leaflets.  9. The mitral valve has been repaired/replaced. Moderate to severe mitral  valve regurgitation.  10. The tricuspid valve is normal in structure. Tricuspid valve  regurgitation is mild.  11. The aortic valve is abnormal Aortic valve regurgitation is mild by  color flow Doppler.  12. There is Severe calcifcation of the aortic valve.  13. There is Severely thickening of the aortic valve.  14. Suspect low flow-low gradient AS given visual limited movement of  aortic valve and severely reduced LV EF.  15. The pulmonic valve was grossly normal. Pulmonic valve regurgitation is  mild to moderate by color flow Doppler.  16. Moderately elevated pulmonary artery systolic pressure.  17. The inferior vena cava is normal in  size with <50% respiratory  variability, suggesting right atrial pressure of 8 mmHg.  18. While heart rate much improved on this study, side by side comparison  shows dilation of the LV and stable severely reduced LV EF. The aortic  valve is severely thickened/calcified and visually appears to minimally  open. Suspect low flow low gradient  severe AS, as dimensionless index 0.18. Prior MV repair with immobile  posterior mitral valve leaflet.   FINDINGS  Left Ventricle: Left ventricular ejection fraction, by visual estimation,  is 20 to 25%. The left ventricle has severely decreased function. There is  no left ventricular hypertrophy. Moderate to severely increased left  ventricular size. Spectral Doppler  shows Left ventricular diastolic function could not be evaluated pattern  of LV diastolic filling.   Right Ventricle: The right ventricular size is mildly enlarged. No  increase in right ventricular wall thickness. Global RV systolic function  is has normal systolic function. The tricuspid regurgitant velocity is  3.04 m/s, and with an assumed right atrial  pressure of 8 mmHg, the estimated right ventricular systolic pressure is  moderately elevated at 44.9 mmHg.   Left Atrium: Left atrial size was moderately dilated.   Right Atrium: Right atrial size was moderately dilated   Pericardium: There is no evidence of pericardial effusion.   Mitral Valve: The mitral valve has been repaired/replaced. There is  moderate thickening of the mitral valve leaflet(s). There is moderate  calcification of the mitral valve leaflet(s). Severely decreased mobility  of the mitral valve leaflets. Moderate to  severe mitral valve regurgitation. Fixed posterior MV leaflet.   Tricuspid Valve: The tricuspid valve is normal in structure. Tricuspid  valve regurgitation is mild by color flow Doppler.   Aortic Valve: The aortic valve is abnormal. There is Severely thickening  and Severe calcifcation of  the aortic valve. Aortic valve regurgitation is  mild by color flow Doppler. There is Severely thickening of the aortic  valve. Severe calcifcation. Aortic  valve mean gradient measures 12.2 mmHg. Aortic valve peak gradient  measures 21.7 mmHg. Aortic valve area, by VTI measures 0.83 cm. Suspect  low flow-low gradient AS given visual limited movement of aortic valve and  severely reduced LV EF.   Pulmonic Valve: The pulmonic valve was grossly normal. Pulmonic valve  regurgitation is mild to moderate by color flow Doppler.   Aorta: The aortic root and ascending aorta are structurally normal, with  no evidence of  dilitation.   Venous: The inferior vena cava is normal in size with less than 50%  respiratory variability, suggesting right atrial pressure of 8 mmHg.   IAS/Shunts: No atrial level shunt detected by color flow Doppler.      LEFT VENTRICLE  PLAX 2D  LVIDd:     6.10 cm  LVIDs:     5.20 cm  LV PW:     1.10 cm  LV IVS:    0.90 cm  LVOT diam:   2.40 cm  LV SV:     57 ml  LV SV Index:  29.16  LVOT Area:   4.52 cm     RIGHT VENTRICLE      IVC  RVSP:      39.9 mmHg IVC diam: 2.00 cm   LEFT ATRIUM       Index    RIGHT ATRIUM      Index  LA diam:    4.50 cm 2.33 cm/m RA Pressure: 3.00 mmHg  LA Vol (A2C):  85.3 ml 44.11 ml/m RA Area:   21.20 cm  LA Vol (A4C):  91.5 ml 47.32 ml/m RA Volume:  71.60 ml 37.03 ml/m  LA Biplane Vol: 96.1 ml 49.69 ml/m  AORTIC VALVE  AV Area (Vmax):  0.77 cm  AV Area (Vmean):  0.78 cm  AV Area (VTI):   0.83 cm  AV Vmax:      232.80 cm/s  AV Vmean:     161.000 cm/s  AV VTI:      0.419 m  AV Peak Grad:   21.7 mmHg  AV Mean Grad:   12.2 mmHg  LVOT Vmax:     39.66 cm/s  LVOT Vmean:    27.780 cm/s  LVOT VTI:     0.077 m  LVOT/AV VTI ratio: 0.18    AORTA  Ao Root diam: 3.10 cm  Ao Asc diam: 3.20 cm   MITRAL VALVE             TRICUSPID VALVE  MV Area (PHT):           TR Peak grad:  36.9 mmHg                   TR Vmax:    329.00 cm/s  MV Decel Time: 190 msec       Estimated RAP: 3.00 mmHg  MV E velocity: 168.20 cm/s 103 cm/s RVSP:      39.9 mmHg                     SHUNTS                   Systemic VTI: 0.08 m                   Systemic Diam: 2.40 cm   STS RISK CALCULATOR: Not able to calculate for aortic/mitral valve replacement  ASSESSMENT AND PLAN: 1.  Severe, Stage D2 (low flow low gradient), aortic stenosis.   I have reviewed the natural history of aortic stenosis with the patient and their family members who are present today. We have discussed the limitations of medical therapy and the poor prognosis associated with symptomatic aortic stenosis. We have reviewed potential treatment options, including palliative medical therapy, conventional surgical aortic valve replacement, and transcatheter aortic valve replacement. We discussed treatment options in the context of the patient's specific comorbid medical conditions.  I have personally reviewed extensive records from the inpatient and outpatient  setting.  He has not been hospitalized since 2019 when he was first noted to have severe LV dysfunction.  Potential causes of LV dysfunction include valvular heart disease, progressive ischemic disease, and most likely tachycardia mediated with difficult to control atrial fibrillation.  I have personally reviewed the patient's echo images which are discussed in the HPI.  I agree the patient has severe LV dysfunction, severe calcification and restriction of the aortic valve leaflets with a low dimensionless index of 0.18, suspicious for low-flow low gradient aortic stenosis especially in the setting of moderately severe mitral insufficiency.  The patient has significant comorbid conditions including advanced  age, stage IIIb chronic kidney disease, thrombocytopenia, interstitial lung disease, and chronic systolic heart failure.  It seems appropriate to evaluate him for TAVR.  This would include right and left heart catheterization with limited contrast in the setting of his chronic kidney disease.  He would need CTA studies as well.  Once his evaluation is completed, he will be referred for cardiac surgical evaluation as part of a multidisciplinary approach to his care.  I do not think he would be a candidate for conventional surgery under any circumstances because of comorbid conditions outlined above.  The question would be whether he would be a candidate for TAVR to treat low flow low gradient aortic stenosis.  If so, it might be reasonable to follow his mitral regurgitation and only consider transcatheter mitral valve therapies such as valve in ring if he continues to exhibit symptoms of heart failure and does not show improvement in mitral regurgitation following TAVR.  I have reviewed the risks, indications, and alternatives to cardiac catheterization, possible angioplasty, and stenting with the patient. Risks include but are not limited to bleeding, infection, vascular injury, stroke, myocardial infection, arrhythmia, kidney injury, radiation-related injury in the case of prolonged fluoroscopy use, emergency cardiac surgery, and death. The patient understands the risks of serious complication is 1-2 in 2080 with diagnostic cardiac cath and 1-2% or less with angioplasty/stenting.   Deatra James 04/26/2019 10:05 AM     [facility] [address] [place] [address]  [phone] (office) [phone] (fax)

## 2019-04-25 LAB — CBC WITH DIFFERENTIAL/PLATELET
Basophils Absolute: 0 10*3/uL (ref 0.0–0.2)
Basos: 1 %
EOS (ABSOLUTE): 0.2 10*3/uL (ref 0.0–0.4)
Eos: 4 %
Hematocrit: 40.1 % (ref 37.5–51.0)
Hemoglobin: 13.6 g/dL (ref 13.0–17.7)
Immature Grans (Abs): 0 10*3/uL (ref 0.0–0.1)
Immature Granulocytes: 0 %
Lymphocytes Absolute: 0.7 10*3/uL (ref 0.7–3.1)
Lymphs: 13 %
MCH: 30.6 pg (ref 26.6–33.0)
MCHC: 33.9 g/dL (ref 31.5–35.7)
MCV: 90 fL (ref 79–97)
Monocytes Absolute: 0.6 10*3/uL (ref 0.1–0.9)
Monocytes: 11 %
Neutrophils Absolute: 3.6 10*3/uL (ref 1.4–7.0)
Neutrophils: 71 %
Platelets: 88 10*3/uL — CL (ref 150–450)
RBC: 4.44 x10E6/uL (ref 4.14–5.80)
RDW: 14.4 % (ref 11.6–15.4)
WBC: 5 10*3/uL (ref 3.4–10.8)

## 2019-04-25 LAB — BASIC METABOLIC PANEL
BUN/Creatinine Ratio: 21 (ref 10–24)
BUN: 44 mg/dL — ABNORMAL HIGH (ref 8–27)
CO2: 25 mmol/L (ref 20–29)
Calcium: 9.9 mg/dL (ref 8.6–10.2)
Chloride: 94 mmol/L — ABNORMAL LOW (ref 96–106)
Creatinine, Ser: 2.1 mg/dL — ABNORMAL HIGH (ref 0.76–1.27)
GFR calc Af Amer: 32 mL/min/{1.73_m2} — ABNORMAL LOW (ref >59)
GFR calc non Af Amer: 28 mL/min/{1.73_m2} — ABNORMAL LOW (ref >59)
Glucose: 113 mg/dL — ABNORMAL HIGH (ref 65–99)
Potassium: 3.4 mmol/L — ABNORMAL LOW (ref 3.5–5.2)
Sodium: 141 mmol/L (ref 134–144)

## 2019-04-26 ENCOUNTER — Encounter: Payer: Self-pay | Admitting: Cardiovascular Disease

## 2019-04-27 ENCOUNTER — Telehealth: Payer: Self-pay | Admitting: *Deleted

## 2019-04-27 NOTE — Telephone Encounter (Signed)
Per Dr Apolonio Schneiders flow rate 4 hours at 1 ml/kg without the 3 ml/kg bolus

## 2019-04-27 NOTE — Telephone Encounter (Signed)
Per Dr Cooper--04/24/19 K 3.4 -No need to repeat the BMET morning of procedure.

## 2019-04-27 NOTE — Telephone Encounter (Signed)
Pt contacted pre-catheterization scheduled at [facility] for: Tuesday April 28, 2019 10:30 AM Verified arrival time and place: [facility] [place]) at: 5:30 AM-pre procedure hydration    No solid food after midnight prior to cath, clear liquids until 5 AM day of procedure. Contrast allergy: no  Hold: Eliquis-none 04/26/19 until post procedure. Torsemide-day before and AM of procedure.  Except hold medications AM meds can be  taken pre-cath with sip of water including: ASA 81 mg   Confirmed patient has responsible adult to drive home post procedure and observe 24 hours after arriving home: yes  Currently, due to Covid-19 pandemic, only one person will be allowed with patient. Must be the same person for patient's entire stay and will be required to wear a mask. They will be asked to wait in the waiting room for the duration of the patient's stay.  Patients are required to wear a mask when they enter the hospital.      COVID-19 Pre-Screening Questions:  . In the past 7 to 10 days have you had a cough,  shortness of breath, headache, congestion, fever (100 or greater) body aches, chills, sore throat, or sudden loss of taste or sense of smell? no . Have you been around anyone with known Covid 19 in the past 7-10 days? no . Have you been around anyone who is awaiting Covid 19 test results in the past 7 to 10 days? no . Have you been around anyone who has been exposed to Covid 19, or has mentioned symptoms of Covid 19 within the past 7 to 10 days? No  I reviewed procedure/mask/visitor instructions, Covid-19 screening questions with patient's wife( with patient's verbal permission), she verbalized understanding, thanked me for call.

## 2019-04-28 ENCOUNTER — Other Ambulatory Visit: Payer: Self-pay

## 2019-04-28 ENCOUNTER — Ambulatory Visit (HOSPITAL_COMMUNITY)
Admission: RE | Admit: 2019-04-28 | Discharge: 2019-04-28 | Disposition: A | Discharge status: Home / Self Care | Payer: PPO | Attending: Cardiovascular Disease | Admitting: Cardiovascular Disease

## 2019-04-28 ENCOUNTER — Other Ambulatory Visit: Payer: Self-pay | Admitting: Physician Assistant

## 2019-04-28 ENCOUNTER — Encounter (HOSPITAL_COMMUNITY)
Admission: RE | Disposition: A | Discharge status: Home / Self Care | Payer: Self-pay | Source: Home / Self Care | Attending: Cardiovascular Disease

## 2019-04-28 DIAGNOSIS — E039 Hypothyroidism, unspecified: Secondary | ICD-10-CM | POA: Insufficient documentation

## 2019-04-28 DIAGNOSIS — I2510 Atherosclerotic heart disease of native coronary artery without angina pectoris: Secondary | ICD-10-CM | POA: Insufficient documentation

## 2019-04-28 DIAGNOSIS — I350 Nonrheumatic aortic (valve) stenosis: Secondary | ICD-10-CM | POA: Diagnosis not present

## 2019-04-28 DIAGNOSIS — N183 Chronic kidney disease, stage 3 unspecified: Secondary | ICD-10-CM | POA: Diagnosis not present

## 2019-04-28 DIAGNOSIS — I2582 Chronic total occlusion of coronary artery: Secondary | ICD-10-CM | POA: Insufficient documentation

## 2019-04-28 DIAGNOSIS — I130 Hypertensive heart and chronic kidney disease with heart failure and stage 1 through stage 4 chronic kidney disease, or unspecified chronic kidney disease: Secondary | ICD-10-CM | POA: Insufficient documentation

## 2019-04-28 DIAGNOSIS — I083 Combined rheumatic disorders of mitral, aortic and tricuspid valves: Secondary | ICD-10-CM | POA: Diagnosis not present

## 2019-04-28 DIAGNOSIS — I4821 Permanent atrial fibrillation: Secondary | ICD-10-CM | POA: Diagnosis not present

## 2019-04-28 DIAGNOSIS — Z79899 Other long term (current) drug therapy: Secondary | ICD-10-CM | POA: Diagnosis not present

## 2019-04-28 DIAGNOSIS — Z79890 Hormone replacement therapy (postmenopausal): Secondary | ICD-10-CM | POA: Insufficient documentation

## 2019-04-28 DIAGNOSIS — Z951 Presence of aortocoronary bypass graft: Secondary | ICD-10-CM | POA: Diagnosis not present

## 2019-04-28 DIAGNOSIS — Z7901 Long term (current) use of anticoagulants: Secondary | ICD-10-CM | POA: Diagnosis not present

## 2019-04-28 DIAGNOSIS — Z886 Allergy status to analgesic agent status: Secondary | ICD-10-CM | POA: Diagnosis not present

## 2019-04-28 DIAGNOSIS — Z8673 Personal history of transient ischemic attack (TIA), and cerebral infarction without residual deficits: Secondary | ICD-10-CM | POA: Diagnosis not present

## 2019-04-28 DIAGNOSIS — R0602 Shortness of breath: Secondary | ICD-10-CM | POA: Insufficient documentation

## 2019-04-28 DIAGNOSIS — Z87891 Personal history of nicotine dependence: Secondary | ICD-10-CM | POA: Insufficient documentation

## 2019-04-28 DIAGNOSIS — E785 Hyperlipidemia, unspecified: Secondary | ICD-10-CM | POA: Diagnosis not present

## 2019-04-28 DIAGNOSIS — I5042 Chronic combined systolic (congestive) and diastolic (congestive) heart failure: Secondary | ICD-10-CM | POA: Diagnosis not present

## 2019-04-28 HISTORY — PX: RIGHT/LEFT HEART CATH AND CORONARY/GRAFT ANGIOGRAPHY: CATH118267

## 2019-04-28 LAB — POCT I-STAT EG7
Acid-Base Excess: 4 mmol/L — ABNORMAL HIGH (ref 0.0–2.0)
Bicarbonate: 28.4 mmol/L — ABNORMAL HIGH (ref 20.0–28.0)
Calcium, Ion: 1.14 mmol/L — ABNORMAL LOW (ref 1.15–1.40)
HCT: 37 % — ABNORMAL LOW (ref 39.0–52.0)
Hemoglobin: 12.6 g/dL — ABNORMAL LOW (ref 13.0–17.0)
O2 Saturation: 64 %
Potassium: 2.4 mmol/L — CL (ref 3.5–5.1)
Sodium: 143 mmol/L (ref 135–145)
TCO2: 30 mmol/L (ref 22–32)
pCO2, Ven: 42.1 mmHg — ABNORMAL LOW (ref 44.0–60.0)
pH, Ven: 7.438 — ABNORMAL HIGH (ref 7.250–7.430)
pO2, Ven: 32 mmHg (ref 32.0–45.0)

## 2019-04-28 SURGERY — RIGHT/LEFT HEART CATH AND CORONARY/GRAFT ANGIOGRAPHY
Anesthesia: LOCAL

## 2019-04-28 MED ORDER — SODIUM CHLORIDE 0.9 % IV SOLN: 250.0000 mL | INTRAVENOUS | Status: DC | PRN

## 2019-04-28 MED ORDER — LABETALOL HCL 5 MG/ML IV SOLN: 10.0000 mg | INTRAVENOUS | Status: DC | PRN

## 2019-04-28 MED ORDER — SODIUM CHLORIDE 0.9% FLUSH: 3.0000 mL | Freq: Two times a day (BID) | INTRAVENOUS | Status: DC

## 2019-04-28 MED ORDER — MIDAZOLAM HCL 5 MG/5ML IJ SOLN
INTRAMUSCULAR | Status: AC
  Filled 2019-04-28: qty 5

## 2019-04-28 MED ORDER — ONDANSETRON HCL 4 MG/2ML IJ SOLN: 4.0000 mg | Freq: Four times a day (QID) | INTRAMUSCULAR | Status: DC | PRN

## 2019-04-28 MED ORDER — FENTANYL CITRATE (PF) 100 MCG/2ML IJ SOLN
INTRAMUSCULAR | Status: DC | PRN
  Administered 2019-04-28: 25 ug via INTRAVENOUS

## 2019-04-28 MED ORDER — FENTANYL CITRATE (PF) 100 MCG/2ML IJ SOLN
INTRAMUSCULAR | Status: AC
  Filled 2019-04-28: qty 2

## 2019-04-28 MED ORDER — SODIUM CHLORIDE 0.9% FLUSH: 3.0000 mL | INTRAVENOUS | Status: DC | PRN

## 2019-04-28 MED ORDER — VERAPAMIL HCL 2.5 MG/ML IV SOLN
INTRAVENOUS | Status: DC | PRN
  Administered 2019-04-28: 10 mL via INTRA_ARTERIAL

## 2019-04-28 MED ORDER — SODIUM CHLORIDE 0.9 % WEIGHT BASED INFUSION
1.0000 mL/kg/h | INTRAVENOUS | Status: AC
  Administered 2019-04-28: 06:00:00 1 mL/kg/h via INTRAVENOUS

## 2019-04-28 MED ORDER — HEPARIN (PORCINE) IN NACL 1000-0.9 UT/500ML-% IV SOLN
INTRAVENOUS | Status: AC
  Filled 2019-04-28: qty 1000

## 2019-04-28 MED ORDER — LIDOCAINE HCL (PF) 1 % IJ SOLN
INTRAMUSCULAR | Status: DC | PRN
  Administered 2019-04-28: 5 mL

## 2019-04-28 MED ORDER — SODIUM CHLORIDE 0.9 % WEIGHT BASED INFUSION: 1.0000 mL/kg/h | INTRAVENOUS | Status: DC

## 2019-04-28 MED ORDER — HEPARIN SODIUM (PORCINE) 1000 UNIT/ML IJ SOLN
INTRAMUSCULAR | Status: AC
  Filled 2019-04-28: qty 1

## 2019-04-28 MED ORDER — HYDRALAZINE HCL 20 MG/ML IJ SOLN: 10.0000 mg | INTRAMUSCULAR | Status: DC | PRN

## 2019-04-28 MED ORDER — IOHEXOL 350 MG/ML SOLN
INTRAVENOUS | Status: DC | PRN
  Administered 2019-04-28: 40 mL via INTRA_ARTERIAL

## 2019-04-28 MED ORDER — LIDOCAINE HCL 1 % IJ SOLN
INTRAMUSCULAR | Status: AC
  Filled 2019-04-28: qty 20

## 2019-04-28 MED ORDER — HEPARIN (PORCINE) IN NACL 1000-0.9 UT/500ML-% IV SOLN
INTRAVENOUS | Status: DC | PRN
  Administered 2019-04-28 (×2): 500 mL

## 2019-04-28 MED ORDER — ASPIRIN 81 MG PO CHEW: 81.0000 mg | CHEWABLE_TABLET | ORAL | Status: DC

## 2019-04-28 MED ORDER — HEPARIN SODIUM (PORCINE) 1000 UNIT/ML IJ SOLN
INTRAMUSCULAR | Status: DC | PRN
  Administered 2019-04-28: 3500 [IU] via INTRAVENOUS

## 2019-04-28 MED ORDER — ACETAMINOPHEN 325 MG PO TABS: 650.0000 mg | ORAL_TABLET | ORAL | Status: DC | PRN

## 2019-04-28 MED ORDER — VERAPAMIL HCL 2.5 MG/ML IV SOLN
INTRAVENOUS | Status: AC
  Filled 2019-04-28: qty 2

## 2019-04-28 MED ORDER — MIDAZOLAM HCL 2 MG/2ML IJ SOLN
INTRAMUSCULAR | Status: DC | PRN
  Administered 2019-04-28: 1 mg via INTRAVENOUS

## 2019-04-28 SURGICAL SUPPLY — 14 items
CATH BALLN WEDGE 5F 110CM (CATHETERS) ×1 IMPLANT
CATH DXT MULTI JL4 JR4 ANG PIG (CATHETERS) ×1 IMPLANT
CATH INFINITI 5FR AL1 (CATHETERS) ×1 IMPLANT
DEVICE RAD COMP TR BAND LRG (VASCULAR PRODUCTS) ×1 IMPLANT
GLIDESHEATH SLEND SS 6F .021 (SHEATH) ×1 IMPLANT
GUIDEWIRE INQWIRE 1.5J.035X260 (WIRE) IMPLANT
INQWIRE 1.5J .035X260CM (WIRE) ×2
KIT HEART LEFT (KITS) ×2 IMPLANT
PACK CARDIAC CATHETERIZATION (CUSTOM PROCEDURE TRAY) ×2 IMPLANT
SHEATH GLIDE SLENDER 4/5FR (SHEATH) ×1 IMPLANT
TRANSDUCER W/STOPCOCK (MISCELLANEOUS) ×2 IMPLANT
TUBING CIL FLEX 10 FLL-RA (TUBING) ×2 IMPLANT
WIRE EMERALD ST .035X260CM (WIRE) ×1 IMPLANT
WIRE HI TORQ VERSACORE-J 145CM (WIRE) ×1 IMPLANT

## 2019-04-28 NOTE — Discharge Instructions (Signed)
Radial Site Care  This sheet gives you information about how to care for yourself after your procedure. Your health care provider may also give you more specific instructions. If you have problems or questions, contact your health care provider. What can I expect after the procedure? After the procedure, it is common to have:  Bruising and tenderness at the catheter insertion area. Follow these instructions at home: Medicines  Take over-the-counter and prescription medicines only as told by your health care provider. Insertion site care  Follow instructions from your health care provider about how to take care of your insertion site. Make sure you: ? Wash your hands with soap and water before you change your bandage (dressing). If soap and water are not available, use hand sanitizer. ? Change your dressing as told by your health care provider. ? Leave stitches (sutures), skin glue, or adhesive strips in place. These skin closures may need to stay in place for 2 weeks or longer. If adhesive strip edges start to loosen and curl up, you may trim the loose edges. Do not remove adhesive strips completely unless your health care provider tells you to do that.  Check your insertion site every day for signs of infection. Check for: ? Redness, swelling, or pain. ? Fluid or blood. ? Pus or a bad smell. ? Warmth.  Do not take baths, swim, or use a hot tub until your health care provider approves.  You may shower 24-48 hours after the procedure, or as directed by your health care provider. ? Remove the dressing and gently wash the site with plain soap and water. ? Pat the area dry with a clean towel. ? Do not rub the site. That could cause bleeding.  Do not apply powder or lotion to the site. Activity   For 24 hours after the procedure, or as directed by your health care provider: ? Do not flex or bend the affected arm. ? Do not push or pull heavy objects with the affected arm. ? Do not  drive yourself home from the hospital or clinic. You may drive 24 hours after the procedure unless your health care provider tells you not to. ? Do not operate machinery or power tools.  Do not lift anything that is heavier than 10 lb (4.5 kg), or the limit that you are told, until your health care provider says that it is safe.  Ask your health care provider when it is okay to: ? Return to work or school. ? Resume usual physical activities or sports. ? Resume sexual activity. General instructions  If the catheter site starts to bleed, raise your arm and put firm pressure on the site. If the bleeding does not stop, get help right away. This is a medical emergency.  If you went home on the same day as your procedure, a responsible adult should be with you for the first 24 hours after you arrive home.  Keep all follow-up visits as told by your health care provider. This is important. Contact a health care provider if:  You have a fever.  You have redness, swelling, or yellow drainage around your insertion site. Get help right away if:  You have unusual pain at the radial site.  The catheter insertion area swells very fast.  The insertion area is bleeding, and the bleeding does not stop when you hold steady pressure on the area.  Your arm or hand becomes pale, cool, tingly, or numb. These symptoms may represent a serious problem   that is an emergency. Do not wait to see if the symptoms will go away. Get medical help right away. Call your local emergency services (911 in the [place] Do not drive yourself to the hospital. Summary  After the procedure, it is common to have bruising and tenderness at the site.  Follow instructions from your health care provider about how to take care of your radial site wound. Check the wound every day for signs of infection.  Do not lift anything that is heavier than 10 lb (4.5 kg), or the limit that you are told, until your health care provider says  that it is safe. This information is not intended to replace advice given to you by your health care provider. Make sure you discuss any questions you have with your health care provider. Document Revised: 04/10/2017 Document Reviewed: 04/10/2017 Elsevier Patient Education  2020 [employer]  

## 2019-04-28 NOTE — Interval H&P Note (Signed)
History and Physical Interval Note:  04/28/2019 9:46 AM  [name]  has presented today for surgery, with the diagnosis of aortic stenosis.  The various methods of treatment have been discussed with the patient and family. After consideration of risks, benefits and other options for treatment, the patient has consented to  Procedure(s): RIGHT/LEFT HEART CATH AND CORONARY/GRAFT ANGIOGRAPHY (N/A) as a surgical intervention.  The patient's history has been reviewed, patient examined, no change in status, stable for surgery.  I have reviewed the patient's chart and labs.  Questions were answered to the patient's satisfaction.     Sherren Mocha

## 2019-05-05 DIAGNOSIS — I2510 Atherosclerotic heart disease of native coronary artery without angina pectoris: Secondary | ICD-10-CM | POA: Diagnosis not present

## 2019-05-05 DIAGNOSIS — E039 Hypothyroidism, unspecified: Secondary | ICD-10-CM | POA: Diagnosis not present

## 2019-05-05 DIAGNOSIS — N183 Chronic kidney disease, stage 3 unspecified: Secondary | ICD-10-CM | POA: Diagnosis not present

## 2019-05-05 DIAGNOSIS — E7800 Pure hypercholesterolemia, unspecified: Secondary | ICD-10-CM | POA: Diagnosis not present

## 2019-05-05 DIAGNOSIS — I10 Essential (primary) hypertension: Secondary | ICD-10-CM | POA: Diagnosis not present

## 2019-05-05 DIAGNOSIS — I480 Paroxysmal atrial fibrillation: Secondary | ICD-10-CM | POA: Diagnosis not present

## 2019-05-05 DIAGNOSIS — I5022 Chronic systolic (congestive) heart failure: Secondary | ICD-10-CM | POA: Diagnosis not present

## 2019-05-05 DIAGNOSIS — I501 Left ventricular failure: Secondary | ICD-10-CM | POA: Diagnosis not present

## 2019-05-05 DIAGNOSIS — I5030 Unspecified diastolic (congestive) heart failure: Secondary | ICD-10-CM | POA: Diagnosis not present

## 2019-05-12 ENCOUNTER — Other Ambulatory Visit: Payer: PPO

## 2019-05-12 ENCOUNTER — Other Ambulatory Visit: Payer: Self-pay

## 2019-05-12 ENCOUNTER — Encounter (INDEPENDENT_AMBULATORY_CARE_PROVIDER_SITE_OTHER): Payer: Self-pay

## 2019-05-12 DIAGNOSIS — I350 Nonrheumatic aortic (valve) stenosis: Secondary | ICD-10-CM

## 2019-05-12 LAB — BASIC METABOLIC PANEL
BUN/Creatinine Ratio: 20 (ref 10–24)
BUN: 39 mg/dL — ABNORMAL HIGH (ref 8–27)
CO2: 31 mmol/L — ABNORMAL HIGH (ref 20–29)
Calcium: 9.5 mg/dL (ref 8.6–10.2)
Chloride: 97 mmol/L (ref 96–106)
Creatinine, Ser: 1.92 mg/dL — ABNORMAL HIGH (ref 0.76–1.27)
GFR calc Af Amer: 36 mL/min/{1.73_m2} — ABNORMAL LOW (ref >59)
GFR calc non Af Amer: 31 mL/min/{1.73_m2} — ABNORMAL LOW (ref >59)
Glucose: 176 mg/dL — ABNORMAL HIGH (ref 65–99)
Potassium: 3.3 mmol/L — ABNORMAL LOW (ref 3.5–5.2)
Sodium: 141 mmol/L (ref 134–144)

## 2019-05-13 ENCOUNTER — Ambulatory Visit: Payer: PPO | Attending: Physician Assistant | Admitting: Physical Therapy

## 2019-05-13 ENCOUNTER — Encounter: Payer: Self-pay | Admitting: Physical Therapy

## 2019-05-13 ENCOUNTER — Other Ambulatory Visit: Payer: Self-pay

## 2019-05-13 ENCOUNTER — Encounter (HOSPITAL_COMMUNITY)
Admission: RE | Admit: 2019-05-13 | Discharge: 2019-05-13 | Disposition: A | Discharge status: Home / Self Care | Payer: PPO | Source: Ambulatory Visit | Attending: Cardiovascular Disease | Admitting: Cardiovascular Disease

## 2019-05-13 ENCOUNTER — Ambulatory Visit (HOSPITAL_BASED_OUTPATIENT_CLINIC_OR_DEPARTMENT_OTHER)
Admit: 2019-05-13 | Discharge: 2019-05-13 | Disposition: A | Discharge status: Home / Self Care | Payer: PPO | Attending: Cardiovascular Disease | Admitting: Cardiovascular Disease

## 2019-05-13 ENCOUNTER — Ambulatory Visit (HOSPITAL_COMMUNITY)
Admission: RE | Admit: 2019-05-13 | Discharge: 2019-05-13 | Disposition: A | Discharge status: Home / Self Care | Payer: PPO | Source: Ambulatory Visit | Attending: Cardiovascular Disease | Admitting: Cardiovascular Disease

## 2019-05-13 DIAGNOSIS — R2689 Other abnormalities of gait and mobility: Secondary | ICD-10-CM | POA: Diagnosis not present

## 2019-05-13 DIAGNOSIS — I350 Nonrheumatic aortic (valve) stenosis: Secondary | ICD-10-CM

## 2019-05-13 DIAGNOSIS — Z01818 Encounter for other preprocedural examination: Secondary | ICD-10-CM | POA: Diagnosis not present

## 2019-05-13 DIAGNOSIS — K7689 Other specified diseases of liver: Secondary | ICD-10-CM | POA: Diagnosis not present

## 2019-05-13 DIAGNOSIS — R911 Solitary pulmonary nodule: Secondary | ICD-10-CM | POA: Diagnosis not present

## 2019-05-13 MED ORDER — SODIUM CHLORIDE 0.9 % WEIGHT BASED INFUSION: 1.0000 mL/kg/h | INTRAVENOUS | Status: DC

## 2019-05-13 MED ORDER — SODIUM CHLORIDE 0.9 % WEIGHT BASED INFUSION
3.0000 mL/kg/h | INTRAVENOUS | Status: AC
  Administered 2019-05-13: 3 mL/kg/h via INTRAVENOUS

## 2019-05-13 MED ORDER — IOHEXOL 350 MG/ML SOLN
80.0000 mL | Freq: Once | INTRAVENOUS | Status: AC | PRN
  Administered 2019-05-13: 80 mL via INTRAVENOUS

## 2019-05-13 NOTE — Progress Notes (Signed)
Called CT and let [name] know the patient would be finished with his hour of fluids at 0930

## 2019-05-13 NOTE — Therapy (Signed)
[place], [place], [address] Phone: [phone]   Fax:  [phone]  Physical Therapy Evaluation  Patient Details  Name: [name] MRN: [id number] Date of Birth: [birth year] Referring Provider (PT): Eileen Stanford, [place]   Encounter Date: 05/13/2019  PT End of Session - 05/13/19 1313    Visit Number  1    Number of Visits  1    Date for PT Re-Evaluation  05/13/19    PT Start Time  1696    PT Stop Time  1345    PT Time Calculation (min)  32 min    Activity Tolerance  Patient tolerated treatment well    Behavior During Therapy  [facility] for tasks assessed/performed       Past Medical History:  Diagnosis Date  . Adenomatous colon polyp   . Aortic stenosis    a. mild by echo 2017.  Marland Kitchen CAD (coronary artery disease)    a. S/P CABG x 1 @ time of MV annuloplasty;  b. 02/2010 ETT: neg for ischemia.  . Carotid artery stenosis    1-39% by dopplers 10/2016  . Chronic diastolic CHF (congestive heart failure) ([place])   . CKD (chronic kidney disease), stage III   . Current use of long term anticoagulation    a. Coumadin in setting of afib.  . Diverticulosis    Colonoscopy  . Dyslipidemia   . Essential hypertension   . H/O mitral valve repair   . Hemorrhoids   . Hyperlipidemia   . Hypertension   . Permanent atrial fibrillation (HCC)    a. CHA2DS2VASc = 6 -->chronic coumadin;  b. Prev on amio-->d/c 2/2 hypothyroidism. >> resumed 5/16. c. Notes from 2017 indicate interstitial lung disease by pulm appt, question amio toxicity, also increased liver attenuation. Rate control pursued and amiodarone discontinued.  Marland Kitchen TIA (transient ischemic attack)     Past Surgical History:  Procedure Laterality Date  . CORONARY ARTERY BYPASS GRAFT    . MITRAL VALVE REPAIR    . RIGHT/LEFT HEART CATH AND CORONARY/GRAFT ANGIOGRAPHY N/A 04/28/2019   Procedure: RIGHT/LEFT HEART CATH AND CORONARY/GRAFT ANGIOGRAPHY;  Surgeon: Sherren Mocha, MD;  Location: [facility];  Service: Cardiovascular;  Laterality: N/A;    There were no vitals filed for this visit.   Subjective Assessment - 05/13/19 1319    Subjective  pt is a 86 y.o M with inital  CC of fatigue  that had been going on for several months per pt's wife but reports he got much better after he got his mitral Valve repaired, he is doing much better and reports no other issues. He denies any pain or issues today.    Patient Stated Goals  to get heart better    Currently in Pain?  No/denies         [facility] PT Assessment - 05/13/19 1312      Assessment   Medical Diagnosis  Severe Aortic Stenosis    Referring Provider (PT)  Eileen Stanford, PA-C    Onset Date/Surgical Date  --   several months ago   Hand Dominance  Right      Precautions   Precautions  None      Restrictions   Weight Bearing Restrictions  No      Balance Screen   Has the patient fallen in the past 6 months  No      [place] residence  Living Arrangements  Spouse/significant other    Available Help at Discharge  Family    Type of [place] Access  Level entry    [place]  One level      Prior Function   Level of [place] with basic ADLs      ROM / Strength   AROM / PROM / Strength  AROM;Strength      AROM   Overall AROM   Within functional limits for tasks performed      Strength   Overall Strength Comments  4/5 in bil hip flexors otherwise all muscles are [facility]    Strength Assessment Site  Hand    Right Hand Grip (lbs)  71    Left Hand Grip (lbs)  70      Ambulation/Gait   Gait Pattern  Step-through pattern;Decreased stride length;Trendelenburg;Antalgic       [facility] Pre-Surgical Assessment - 05/13/19 0001    5 Meter Walk Test- trial 1  4 sec    5 Meter Walk Test- trial 2  4 sec.     5 Meter Walk Test- trial 3  4 sec.    5 meter walk test average  4 sec    4 Stage Balance Test tolerated  for:   2 sec.    4 Stage Balance Test Position  4    Comment  unable to perform     ADL/IADL Independent with:  Bathing;Dressing;Meal prep    ADL/IADL Needs Assistance with:  Finances;Yard work    ADL/IADL Therapist, sports Index  Midly frail    6 Minute Walk- Baseline  yes    BP (mmHg)  90/70    HR (bpm)  70    02 Sat (%RA)  96 %    Modified Borg Scale for Dyspnea  0- Nothing at all    Perceived Rate of Exertion (Borg)  6-    6 Minute Walk Post Test  yes    BP (mmHg)  97/62    HR (bpm)  98    02 Sat (%RA)  99 %    Modified Borg Scale for Dyspnea  1- Very mild shortness of breath    Perceived Rate of Exertion (Borg)  11- Fairly light    Aerobic Endurance Distance Walked  1038    Endurance additional comments  pt is 24.12% limited compared to age related norm              Objective measurements completed on examination: See above findings.                           Plan - 05/13/19 1313    Clinical Impression Statement  see assesment in note    Stability/Clinical Decision Making  Stable/Uncomplicated    Clinical Decision Making  Low    PT Frequency  One time visit    PT Next Visit Plan  Pre-TAVR        Clinical Impression Statement: Pt is a 86 yo M presenting to OP PT for evaluation prior to possible TAVR surgery due to severe aortic stenosis. Pt reports onset of SOB/ fatigue approximately several months ago but reports no issues since his Mitral valve repair. Symptoms are limiting fatigue. Pt presents with good ROM and strength, fair balance and is assessed as low at high fall risk 4 stage balance test, good walking speed and limited aerobic endurance per 6 minute walk test. Pt  ambulated 1038 feet without requiring rest break. At end of testing, patient's HR was 98 bpm and O2 was 99% on room air. Pt reported 1/10 shortness of breath on modified scale for dyspnea.  Pt ambulated a total of 1038 feet in 6 minute walk. SOB and HR increased with 6 minute walk test.  Based on the Short Physical Performance Battery, patient has a frailty rating of 8/12 with </= 5/12 considered frail.    Patient demonstrated the following deficits and impairments:     Visit Diagnosis: Other abnormalities of gait and mobility     Problem List Patient Active Problem List   Diagnosis Date Noted  . Dyspnea on exertion and orthopnea 12/24/2017  . ILD (interstitial lung disease) ([place]) 05/25/2016  . External hemorrhoids 08/02/2015  . Severe aortic stenosis 07/08/2015  . Long term (current) use of anticoagulants [Z79.01] 07/08/2015  . Carotid artery stenosis 11/22/2014  . Rectal bleeding 04/13/2014  . Chronic anticoagulation 04/13/2014  . Chronic kidney disease (CKD), stage III (moderate) 03/02/2014  . Coronary artery disease involving native coronary artery of native heart without angina pectoris 03/02/2014  . H/O mitral valve repair 02/07/2014  . Permanent atrial fibrillation 02/07/2014  . Chronic combined systolic and diastolic heart failure ([place]) 02/07/2014  . Dyslipidemia 02/07/2014  . Current use of long term anticoagulation 02/07/2014  . Generalized weakness 02/07/2014   Starr Lake PT, DPT, LAT, ATC  05/13/19  1:56 PM      [facility] [address] [place], [place], [address] Phone: [phone]   Fax:  [phone]  Name: Rashan Rounsaville MRN: [id number] Date of Birth: 1931/04/26

## 2019-05-16 ENCOUNTER — Encounter: Payer: Self-pay | Admitting: Physician Assistant

## 2019-05-16 DIAGNOSIS — G4731 Primary central sleep apnea: Secondary | ICD-10-CM | POA: Diagnosis not present

## 2019-05-18 ENCOUNTER — Other Ambulatory Visit: Payer: Self-pay

## 2019-05-18 ENCOUNTER — Encounter: Payer: Self-pay | Admitting: Thoracic Surgery (Cardiothoracic Vascular Surgery)

## 2019-05-18 ENCOUNTER — Institutional Professional Consult (permissible substitution): Payer: PPO | Admitting: Thoracic Surgery (Cardiothoracic Vascular Surgery)

## 2019-05-18 VITALS — BP 109/76 | HR 86 | Temp 97.9°F | Resp 16 | Ht 68.0 in | Wt 157.0 lb

## 2019-05-18 DIAGNOSIS — I4821 Permanent atrial fibrillation: Secondary | ICD-10-CM

## 2019-05-18 DIAGNOSIS — I5042 Chronic combined systolic (congestive) and diastolic (congestive) heart failure: Secondary | ICD-10-CM | POA: Diagnosis not present

## 2019-05-18 DIAGNOSIS — I2510 Atherosclerotic heart disease of native coronary artery without angina pectoris: Secondary | ICD-10-CM

## 2019-05-18 DIAGNOSIS — I350 Nonrheumatic aortic (valve) stenosis: Secondary | ICD-10-CM | POA: Diagnosis not present

## 2019-05-18 DIAGNOSIS — Z952 Presence of prosthetic heart valve: Secondary | ICD-10-CM | POA: Diagnosis not present

## 2019-05-18 DIAGNOSIS — Z951 Presence of aortocoronary bypass graft: Secondary | ICD-10-CM

## 2019-05-18 DIAGNOSIS — Z9889 Other specified postprocedural states: Secondary | ICD-10-CM

## 2019-05-18 NOTE — Patient Instructions (Signed)
Continue all previous medications without any changes at this time  

## 2019-05-18 NOTE — Progress Notes (Addendum)
[facility] CONSULTATION REPORT  Primary Cardiologist is Buford Dresser, MD PCP is [name], MD  Chief Complaint  Patient presents with  . Aortic Stenosis    Surgical eval for TAVR, review all testing/studies    HPI:  Patient is an 86 year old male with complex past medical history who has been referred for surgical consultation to discuss treatment options for management of suspected low flow low gradient severe aortic stenosis.  Patient's cardiac history dates back to 2004 when he underwent mitral valve repair and coronary artery bypass grafting x1 by Dr. Servando Snare.  By report the patient had mitral valve prolapse and underwent quadrangular resection of the posterior leaflet with artificial Gore-Tex neocords placement and ring annuloplasty using a 28 mm Seguin semirigid ring.  At the time he underwent coronary artery bypass grafting using a reverse greater saphenous vein graft to the obtuse marginal branch of the left circumflex.  The patient states that he recovered from his surgery uneventfully.  The patient developed persistent atrial fibrillation for which she has remained on long-term anticoagulation, initially using warfarin and more recently using Eliquis.  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 Radford Pax.  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  Patient was hospitalized in October 2019 with acute exacerbation of shortness of breath and congestive heart failure.  He required intensive inpatient diuresis with readjustment of his medications, and since that time he has been followed regularly by Dr. Harrell Gave.  The patient has remained in longstanding persistent atrial  fibrillation and both heart rate and symptoms of congestive heart failure have been difficult to control.  Previous echocardiograms have revealed what was felt to be mild aortic stenosis, but more recent echocardiograms have suggested significant progression with the possibility of severe low-flow low gradient aortic stenosis.  Most recent transthoracic echocardiogram performed January 13, 2019 revealed severe global left ventricular systolic dysfunction with ejection fraction estimated only 20 to 25% in the setting of possibly severe mitral regurgitation.  There was moderate to severe left ventricular chamber enlargement but no significant left ventricular hypertrophy.  The aortic valve leaflets appeared severely calcified.  Peak velocity across aortic valve measured 2.3 m/s corresponding to mean transvalvular gradient estimated 12.2 mmHg but the aortic valve area was notably estimated only 0.77 cm with DVI reported 0.18 and stroke-volume index reported 29.16.  Patient was referred to the multidisciplinary heart valve clinic and has been evaluated previously by Dr. Burt Knack.  Diagnostic cardiac catheterization was performed April 28, 2019.  Patient was found to have a mild coronary artery disease with chronic occlusion of the first obtuse marginal branch of the left circumflex but continued patency of the vein graft to the same branch.  Otherwise there was mild diffuse disease in the distal right coronary artery and nonobstructive disease in the left coronary system.  Catheterization confirmed presence of aortic stenosis with mean transvalvular gradient measured 33 mmHg at catheterization corresponding to aortic valve area calculated 0.8 cm.  There was moderate to severe pulmonary hypertension with PA pressures measured 54/17 despite relatively low systemic arterial pressure.  There were large V waves on pulmonary capillary wedge pressure consistent with severe mitral regurgitation.  Mean central venous pressure  was 11 mmHg.  CT angiography was performed and the patient referred  for surgical consultation.  Patient is married and lives locally with his wife.  Both he and his wife came to his office visit today convinced that the patient had already undergone transcatheter aortic valve replacement at the time he underwent catheterization by Dr. Burt Knack last month.  They were surprised to find out that his valve had not been fixed and the patient states that he feels much better since his diagnostic catheterization.  He admits that he gets short of breath with exertion.  He reports that his weight has been stable recently and he has not had recent exacerbation of lower extremity edema.  He denies any chest pain or chest tightness either with activity or at rest.  He admits to some fatigue and decreased energy.  Denies any palpitations, dizzy spells, or syncope.  He reports that his appetite is okay.  Patient and his wife both seem to have trouble with historical recollection.  Past Medical History:  Diagnosis Date  . Adenomatous colon polyp   . Aortic stenosis    a. mild by echo 2017.  Marland Kitchen CAD (coronary artery disease)    a. S/P CABG x 1 @ time of MV annuloplasty;  b. 02/2010 ETT: neg for ischemia.  . Carotid artery stenosis    1-39% by dopplers 10/2016  . Chronic combined systolic (congestive) and diastolic (congestive) heart failure ([place])   . CKD (chronic kidney disease), stage III   . Current use of long term anticoagulation    a. Coumadin in setting of afib.  . Diverticulosis    Colonoscopy  . Dyslipidemia   . Essential hypertension   . H/O mitral valve repair   . H/O mitral valve repair 09/16/2002   Complex valvuloplasty including quadrangular resection of posterior leaflet with artificial Gore-tex neochords and 28 mm Seguin ring annuloplasty - Dr Servando Snare  . Hemorrhoids   . Hyperlipidemia   . Hypertension   . Permanent atrial fibrillation (HCC)    a. CHA2DS2VASc = 6 -->chronic coumadin;  b. Prev on  amio-->d/c 2/2 hypothyroidism. >> resumed 5/16. c. Notes from 2017 indicate interstitial lung disease by pulm appt, question amio toxicity, also increased liver attenuation. Rate control pursued and amiodarone discontinued.  . S/P CABG x 1 09/16/2002   SVG to OM  . TIA (transient ischemic attack)     Past Surgical History:  Procedure Laterality Date  . CORONARY ARTERY BYPASS GRAFT    . MITRAL VALVE REPAIR    . RIGHT/LEFT HEART CATH AND CORONARY/GRAFT ANGIOGRAPHY N/A 04/28/2019   Procedure: RIGHT/LEFT HEART CATH AND CORONARY/GRAFT ANGIOGRAPHY;  Surgeon: Sherren Mocha, MD;  Location: [facility];  Service: Cardiovascular;  Laterality: N/A;    Family History  Problem Relation Age of Onset  . Stroke Mother        Deceased  . Heart disease Father        Deceased  . Heart failure Brother        Deceased  . Stroke Brother     Social History   Socioeconomic History  . Marital status: Married    Spouse name: Not on file  . Number of children: Not on file  . Years of education: Not on file  . Highest education level: Not on file  Occupational History  . 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 Smoking status: Former Smoker    Packs/day: 0.10    Years: 2.00    Pack years: 0.20    Types: Cigarettes  Quit date: 03/20/1955    Years since quitting: 64.2  . Smokeless tobacco: Never Used  Substance and Sexual Activity  . Alcohol use: No    Alcohol/week: 0.0 standard drinks  . Drug use: No  . Sexual activity: Not Currently  Other Topics Concern  . Not on file  Social History Narrative  . Not on file   Social Determinants of Health   Financial Resource Strain:   . Difficulty of Paying Living Expenses: Not on file  Food Insecurity:   . Worried About Charity fundraiser in the Last Year: Not on file  . Ran Out of Food in the Last Year: Not on file  Transportation Needs:   . Lack of Transportation (Medical): Not on file  . Lack of Transportation  (Non-Medical): Not on file  Physical Activity:   . Days of Exercise per Week: Not on file  . Minutes of Exercise per Session: Not on file  Stress:   . Feeling of Stress : Not on file  Social Connections:   . Frequency of Communication with Friends and Family: Not on file  . Frequency of Social Gatherings with Friends and Family: Not on file  . Attends Religious Services: Not on file  . Active Member of Clubs or Organizations: Not on file  . Attends Archivist Meetings: Not on file  . Marital Status: Not on file  Intimate Partner Violence:   . Fear of Current or Ex-Partner: Not on file  . Emotionally Abused: Not on file  . Physically Abused: Not on file  . Sexually Abused: Not on file    Current Outpatient Medications  Medication Sig Dispense Refill  . amoxicillin (AMOXIL) 500 MG tablet Take 2,000 mg by mouth See admin instructions. Take 2000 mg by mouth one hour prior to dental visits     . apixaban (ELIQUIS) 5 MG TABS tablet Take 2.5 mg by mouth 2 (two) times daily.    . Cholecalciferol (VITAMIN D-3) 1000 units CAPS Take 1,000 Units by mouth daily.     . hydrocortisone (ANUSOL-HC) 2.5 % rectal cream Place 1 application rectally 2 (two) times daily. 30 g 1  . levothyroxine (SYNTHROID) 75 MCG tablet Take 75 mcg by mouth daily before breakfast.     . metoprolol succinate (TOPROL-XL) 50 MG 24 hr tablet Take 1 tablet (50 mg total) by mouth daily. 90 tablet 3  . mirtazapine (REMERON) 15 MG tablet Take 7.5 tablets by mouth daily.     Vladimir Faster Glycol-Propyl Glycol (SYSTANE OP) Place 1 drop into both eyes daily as needed (dry eyes).    . pravastatin (PRAVACHOL) 80 MG tablet Take 80 mg by mouth every Monday, Wednesday, and Friday.     . torsemide (DEMADEX) 20 MG tablet Take 40 mg by mouth 2 (two) times daily.     . vitamin B-12 (CYANOCOBALAMIN) 1000 MCG tablet Take 1,000 mcg by mouth daily.     No current facility-administered medications for this visit.    Allergies    Allergen Reactions  . Asa [Aspirin] Other (See Comments)    Bleeding- has internal hemorrhoids  . Flomax [Tamsulosin] Other (See Comments)    Made his heart go out of rhythm  . Pirfenidone     Weight loss, loss of appetite.      Review of Systems:   General:  fair appetite, decreased energy, no weight gain, no weight loss, no fever  Cardiac:  no chest pain with exertion, no chest pain at rest, +  SOB with exertion, no resting SOB, no PND, no orthopnea, no palpitations, + arrhythmia, + atrial fibrillation, + LE edema, no dizzy spells, no syncope  Respiratory:  + shortness of breath, no home oxygen, no productive cough, no dry cough, no bronchitis, no wheezing, no hemoptysis, no asthma, no pain with inspiration or cough, no sleep apnea, no CPAP at night  GI:   no difficulty swallowing, no reflux, no frequent heartburn, no hiatal hernia, no abdominal pain, no constipation, no diarrhea, no hematochezia, no hematemesis, no melena  GU:   no dysuria,  no frequency, no urinary tract infection, no hematuria, no enlarged prostate, no kidney stones, + kidney disease  Vascular:  no pain suggestive of claudication, no pain in feet, no leg cramps, no varicose veins, no DVT, no non-healing foot ulcer  Neuro:   no stroke, no TIA's, no seizures, no headaches, no temporary blindness one eye,  no slurred speech, no peripheral neuropathy, no chronic pain, no instability of gait, no memory/cognitive dysfunction  Musculoskeletal: + arthritis, no joint swelling, no myalgias, no difficulty walking, normal mobility   Skin:   no rash, no itching, no skin infections, no pressure sores or ulcerations  Psych:   no anxiety, no depression, no nervousness, no unusual recent stress  Eyes:   no blurry vision, no floaters, no recent vision changes, + wears glasses or contacts  ENT:   + hearing loss, no loose or painful teeth, no dentures, last saw dentist 1.5 years ago  Hematologic:  no easy bruising, no abnormal bleeding, no  clotting disorder, no frequent epistaxis  Endocrine:  no diabetes, does not check CBG's at home     Physical Exam:   BP 109/76 (BP Location: Left Arm, Patient Position: Sitting, Cuff Size: Normal)   Pulse 86   Temp 97.9 F (36.6 C)   Resp 16   Ht 5\' 8"  (1.727 m)   Wt 157 lb (71.2 kg)   SpO2 99% Comment: RA  BMI 23.87 kg/m   General:  Elderly male NAD  we  HEENT:  Unremarkable   Neck:   no JVD, no bruits, no adenopathy   Chest:   clear to auscultation, symmetrical breath sounds, no wheezes, no rhonchi   CV:   RRR, grade III/VI systolic murmur   Abdomen:  soft, non-tender, no masses   Extremities:  warm, well-perfused, pulses diminished, mild LE edema  Rectal/GU  Deferred  Neuro:   Grossly non-focal and symmetrical throughout  Skin:   Clean and dry, no rashes, no breakdown   Diagnostic Tests:  ECHOCARDIOGRAM REPORT       Patient Name:  [name] Date of Exam: 01/13/2019  Medical Rec #: [id number]   Height:    68.0 in  Accession #:  [id number]   Weight:    175.6 lb  Date of Birth: 12-Dec-1931   BSA:     1.93 m  Patient Age:  35 years    BP:      118/88 mmHg  Patient Gender: M       HR:      80 bpm.  Exam Location: [address]   Procedure: 2D Echo, Cardiac Doppler and Color Doppler   Indications:  R01.1    History:    Patient has prior history of Echocardiogram examinations,  most         recent 12/25/2017. CHF, CAD, Prior CABG, MV repair;         Arrythmias:Atrial Fibrillation Signs/Symptoms:Murmur Risk  Factors:Hypertension and Dyslipidemia. Mitral Valve: A  repair         valve is present in the mitral position.    Sonographer:  Coralyn Helling RDCS  Referring Phys: [id number] [place]    1. Left ventricular ejection fraction, by visual estimation, is 20 to  25%. The left ventricle has severely decreased function. Moderate to    severely increased left ventricular size. There is no left ventricular  hypertrophy.  2. Left ventricular diastolic function could not be evaluated pattern of  LV diastolic filling.  3. Global right ventricle has normal systolic function.The right  ventricular size is mildly enlarged. No increase in right ventricular wall  thickness.  4. Left atrial size was moderately dilated.  5. Right atrial size was moderately dilated.  6. Moderate calcification of the mitral valve leaflet(s).  7. Moderate thickening of the mitral valve leaflet(s).  8. Severely decreased mobility of the mitral valve leaflets.  9. The mitral valve has been repaired/replaced. Moderate to severe mitral  valve regurgitation.  10. The tricuspid valve is normal in structure. Tricuspid valve  regurgitation is mild.  11. The aortic valve is abnormal Aortic valve regurgitation is mild by  color flow Doppler.  12. There is Severe calcifcation of the aortic valve.  13. There is Severely thickening of the aortic valve.  14. Suspect low flow-low gradient AS given visual limited movement of  aortic valve and severely reduced LV EF.  15. The pulmonic valve was grossly normal. Pulmonic valve regurgitation is  mild to moderate by color flow Doppler.  16. Moderately elevated pulmonary artery systolic pressure.  17. The inferior vena cava is normal in size with <50% respiratory  variability, suggesting right atrial pressure of 8 mmHg.  18. While heart rate much improved on this study, side by side comparison  shows dilation of the LV and stable severely reduced LV EF. The aortic  valve is severely thickened/calcified and visually appears to minimally  open. Suspect low flow low gradient  severe AS, as dimensionless index 0.18. Prior MV repair with immobile  posterior mitral valve leaflet.   FINDINGS  Left Ventricle: Left ventricular ejection fraction, by visual estimation,  is 20 to 25%. The left ventricle has  severely decreased function. There is  no left ventricular hypertrophy. Moderate to severely increased left  ventricular size. Spectral Doppler  shows Left ventricular diastolic function could not be evaluated pattern  of LV diastolic filling.   Right Ventricle: The right ventricular size is mildly enlarged. No  increase in right ventricular wall thickness. Global RV systolic function  is has normal systolic function. The tricuspid regurgitant velocity is  3.04 m/s, and with an assumed right atrial  pressure of 8 mmHg, the estimated right ventricular systolic pressure is  moderately elevated at 44.9 mmHg.   Left Atrium: Left atrial size was moderately dilated.   Right Atrium: Right atrial size was moderately dilated   Pericardium: There is no evidence of pericardial effusion.   Mitral Valve: The mitral valve has been repaired/replaced. There is  moderate thickening of the mitral valve leaflet(s). There is moderate  calcification of the mitral valve leaflet(s). Severely decreased mobility  of the mitral valve leaflets. Moderate to  severe mitral valve regurgitation. Fixed posterior MV leaflet.   Tricuspid Valve: The tricuspid valve is normal in structure. Tricuspid  valve regurgitation is mild by color flow Doppler.   Aortic Valve: The aortic valve is abnormal. There is Severely thickening  and Severe  calcifcation of the aortic valve. Aortic valve regurgitation is  mild by color flow Doppler. There is Severely thickening of the aortic  valve. Severe calcifcation. Aortic  valve mean gradient measures 12.2 mmHg. Aortic valve peak gradient  measures 21.7 mmHg. Aortic valve area, by VTI measures 0.83 cm. Suspect  low flow-low gradient AS given visual limited movement of aortic valve and  severely reduced LV EF.   Pulmonic Valve: The pulmonic valve was grossly normal. Pulmonic valve  regurgitation is mild to moderate by color flow Doppler.   Aorta: The aortic root and ascending  aorta are structurally normal, with  no evidence of dilitation.   Venous: The inferior vena cava is normal in size with less than 50%  respiratory variability, suggesting right atrial pressure of 8 mmHg.   IAS/Shunts: No atrial level shunt detected by color flow Doppler.      LEFT VENTRICLE  PLAX 2D  LVIDd:     6.10 cm  LVIDs:     5.20 cm  LV PW:     1.10 cm  LV IVS:    0.90 cm  LVOT diam:   2.40 cm  LV SV:     57 ml  LV SV Index:  29.16  LVOT Area:   4.52 cm     RIGHT VENTRICLE      IVC  RVSP:      39.9 mmHg IVC diam: 2.00 cm   LEFT ATRIUM       Index    RIGHT ATRIUM      Index  LA diam:    4.50 cm 2.33 cm/m RA Pressure: 3.00 mmHg  LA Vol (A2C):  85.3 ml 44.11 ml/m RA Area:   21.20 cm  LA Vol (A4C):  91.5 ml 47.32 ml/m RA Volume:  71.60 ml 37.03 ml/m  LA Biplane Vol: 96.1 ml 49.69 ml/m  AORTIC VALVE  AV Area (Vmax):  0.77 cm  AV Area (Vmean):  0.78 cm  AV Area (VTI):   0.83 cm  AV Vmax:      232.80 cm/s  AV Vmean:     161.000 cm/s  AV VTI:      0.419 m  AV Peak Grad:   21.7 mmHg  AV Mean Grad:   12.2 mmHg  LVOT Vmax:     39.66 cm/s  LVOT Vmean:    27.780 cm/s  LVOT VTI:     0.077 m  LVOT/AV VTI ratio: 0.18    AORTA  Ao Root diam: 3.10 cm  Ao Asc diam: 3.20 cm   MITRAL VALVE            TRICUSPID VALVE  MV Area (PHT):           TR Peak grad:  36.9 mmHg                   TR Vmax:    329.00 cm/s  MV Decel Time: 190 msec       Estimated RAP: 3.00 mmHg  MV E velocity: 168.20 cm/s 103 cm/s RVSP:      39.9 mmHg                     SHUNTS                   Systemic VTI: 0.08 m                   Systemic Diam: 2.40 cm     [place]  Harrell Gave MD  Electronically signed by Buford Dresser MD  Signature Date/Time:  01/13/2019/1:19:49 PM       RIGHT/LEFT HEART CATH AND CORONARY/GRAFT ANGIOGRAPHY  Conclusion  1.  Nonobstructive coronary disease with mild diffuse stenosis of the LAD, left circumflex, and RCA 2.  Total occlusion of the first OM branch of the circumflex with continued patency of the saphenous vein graft to OM 3.  Severe aortic stenosis with mean gradient 33 mmHg and calculated valve area 0.8 cm 4.  Large V waves consistent with severe mitral regurgitation  Recommendation: Continue multidisciplinary evaluation for this patient with severe low-flow low gradient aortic stenosis, probable severe mitral regurgitation with previous mitral repair, and progressive heart failure symptoms  Indications  Severe aortic stenosis [I35.0 (ICD-10-CM)]  Procedural Details  Technical Details INDICATION: 86 year old gentleman with progressive heart failure and worsening LV function.  He has developed severe calcific aortic stenosis felt to be low flow low gradient aortic stenosis.  He has a history of single-vessel CABG and mitral valve repair.  He is also developed moderately severe mitral regurgitation.  He presents for hemodynamic assessment and assessment of progressive ischemic disease as a potential etiology of his worsening heart failure.  PROCEDURAL DETAILS: There was an indwelling IV in a right antecubital vein. Using normal sterile technique, the IV was changed out for a 5 Fr brachial sheath over a 0.018 inch wire. The left wrist was then prepped, draped, and anesthetized with 1% lidocaine. Using the modified Seldinger technique a 5/6 French Slender sheath was placed in the left radial artery. Intra-arterial verapamil was administered through the radial artery sheath. IV heparin was administered after a JR4 catheter was advanced into the central aorta. A Swan-Ganz catheter was used for the right heart catheterization. Standard protocol was followed for recording of right heart pressures and sampling  of oxygen saturations. Fick cardiac output was calculated. Standard Judkins catheters were used for selective coronary angiography and saphenous vein graft angiography.  The aortic valve was crossed with an AL-1 catheter and straight tip wire.  LV pressure is recorded and an aortic valve pullback is performed. There were no immediate procedural complications. The patient was transferred to the post catheterization recovery area for further monitoring.    Estimated blood loss <50 mL.   During this procedure medications were administered to achieve and maintain moderate conscious sedation while the patient's heart rate, blood pressure, and oxygen saturation were continuously monitored and I was present face-to-face 100% of this time.  Medications (Filter: Administrations occurring from 04/28/19 1010 to 04/28/19 1130) (important)  Continuous medications are totaled by the amount administered until 04/28/19 1130.  fentaNYL (SUBLIMAZE) injection (mcg) Total dose:  25 mcg Date/Time  Rate/Dose/Volume Action  04/28/19 1038  25 mcg Given    midazolam (VERSED) injection (mg) Total dose:  1 mg Date/Time  Rate/Dose/Volume Action  04/28/19 1038  1 mg Given    Heparin (Porcine) in NaCl 1000-0.9 UT/500ML-% SOLN (mL) Total volume:  1,000 mL Date/Time  Rate/Dose/Volume Action  04/28/19 1038  500 mL Given  1038  500 mL Given    lidocaine (PF) (XYLOCAINE) 1 % injection (mL) Total volume:  5 mL Date/Time  Rate/Dose/Volume Action  04/28/19 1039  5 mL Given    Radial Cocktail/Verapamil only (mL) Total volume:  10 mL Date/Time  Rate/Dose/Volume Action  04/28/19 1039  10 mL Given    heparin injection (Units) Total dose:  3,500 Units Date/Time  Rate/Dose/Volume Action  04/28/19 1058  3,500 Units Given  iohexol (OMNIPAQUE) 350 MG/ML injection (mL) Total volume:  40 mL Date/Time  Rate/Dose/Volume Action  04/28/19 1118  40 mL Given    Sedation Time  Sedation Time Physician-1: 35 minutes 23  seconds  Contrast  Medication Name Total Dose  iohexol (OMNIPAQUE) 350 MG/ML injection 40 mL    Radiation/Fluoro  Fluoro time: 6.3 (min) DAP: 23.8 (Gycm2) Cumulative Air Kerma: 289 (mGy)  Coronary Findings  Diagnostic Dominance: Right Left Anterior Descending  There is mild diffuse disease throughout the vessel. The LAD is patent throughout. There is mild nonobstructive disease present. The vessel reaches the LV apex. The diagonal branches are small in caliber with mild diffuse disease.  Left Circumflex  There is mild diffuse disease throughout the vessel. The circumflex is a large vessel. There is a large second OM branch with mild diffuse nonobstructive disease noted. The first OM is occluded and fills from the graft.  First Obtuse Marginal Branch  1st Mrg lesion 100% stenosed  1st Mrg lesion is 100% stenosed.  Right Coronary Artery  There is mild diffuse disease throughout the vessel. Large, dominant vessel. There is diffuse nonobstructive stenosis less than 50% present. PDA and PLA branches are supplied by the RCA.  Mid RCA lesion 50% stenosed  Mid RCA lesion is 50% stenosed.  Saphenous Graft To 1st Mrg  SVG. The saphenous vein graft to first OM is patent without stenosis.  Intervention  No interventions have been documented. Right Heart  Right Heart Pressures Hemodynamic findings consistent with mitral valve regurgitation.  Left Heart  Aortic Valve There is severe aortic valve stenosis. The aortic valve is calcified. There is restricted aortic valve motion. Mean gradient 33 mmHg, calculated aortic valve area 0.8 cm  Coronary Diagrams  Diagnostic Dominance: Right  Intervention  Implants   No implant documentation for this case.  Syngo Images  Show images for CARDIAC CATHETERIZATION  Images on [employer]  Show images for Jahlil, Ziller to Procedure Log  Procedure Log    Hemo Data   Most Recent Value  Fick Cardiac Output 3.99 L/min   Fick Cardiac Output Index 2.18 (L/min)/BSA  Aortic Mean Gradient 32.47 mmHg  Aortic Peak Gradient 32 mmHg  Aortic Valve Area 0.83  Aortic Value Area Index 0.45 cm2/BSA  RA A Wave 10 mmHg  RA V Wave 13 mmHg  RA Mean 11 mmHg  RV Systolic Pressure 54 mmHg  RV Diastolic Pressure 6 mmHg  RV EDP 11 mmHg  PA Systolic Pressure 54 mmHg  PA Diastolic Pressure 17 mmHg  PA Mean 32 mmHg  PW A Wave 16 mmHg  PW V Wave 32 mmHg  PW Mean 19 mmHg  AO Systolic Pressure 99 mmHg  AO Diastolic Pressure 47 mmHg  AO Mean 66 mmHg  LV Systolic Pressure 989 mmHg  LV Diastolic Pressure 7 mmHg  LV EDP 13 mmHg  AOp Systolic Pressure 211 mmHg  AOp Diastolic Pressure 43 mmHg  AOp Mean Pressure 65 mmHg  LVp Systolic Pressure 941 mmHg  LVp Diastolic Pressure 9 mmHg  LVp EDP Pressure 16 mmHg  QP/QS 1  TPVR Index 14.69 HRUI  TSVR Index 30.29 HRUI  PVR SVR Ratio 0.24  TPVR/TSVR Ratio 0.48    Cardiac TAVR CT  TECHNIQUE: The patient was scanned on a [employer]. A 120 kV retrospective scan was triggered in the descending thoracic aorta at 111 HU's. Gantry rotation speed was 250 msecs and collimation was .6 mm. No beta blockade or nitro  were given. The 3D data set was reconstructed in 5% intervals of the R-R cycle. Systolic and diastolic phases were analyzed on a dedicated work station using MPR, MIP and VRT modes. The patient received 80 cc of contrast.  FINDINGS: Aortic Valve: Trileaflet aortic valve with moderately calcified leaflets, severely reduced leaflets opening and no calcifications extending into the LVOT.  Aorta: Normal size with mild diffuse calcifications and no dissection.  Sinotubular Junction: 30 x 28 mm  Ascending Thoracic Aorta: 31 x 31 mm  Aortic Arch: 28 x 27 mm  Descending Thoracic Aorta: 21 x 20 mm  Sinus of Valsalva Measurements:  Non-coronary: 37 mm  Right -coronary: 33 mm  Left -coronary: 34 mm  Virtual Basal Annulus  Measurements:  Maximum/Minimum Diameter: 30.0 x 36.5 mm  Mean Diameter: 27.3 mm  Perimeter: 88.6 mm  Area: 586 mm2  Coronary Artery Height above Annulus:  Left Main: 17 mm  Right Coronary: 22 mm  Optimum Fluoroscopic Angle for Delivery: RAO 2 CRA 2  IMPRESSION: 1. Trileaflet aortic valve with moderately calcified leaflets, severely reduced leaflets opening and no calcifications extending into the LVOT. Aortic valve calcium score 2655 consistent with severe aortic stenosis. Annular measurements suitable for delivery of a 29 mm Edwrads-SAPIEN 3 Ultra valve.  2. Sufficient coronary to annulus distance.  3. Optimum Fluoroscopic Angle for Delivery: RAO 2 CRA 2.  4. No thrombus in the left atrial appendage.  5. Dilated pulmonary artery measuring 34 mm suggestive of pulmonary hypertension.   Electronically Signed   By: Ena Dawley   On: 05/16/2019 00:32       CT ANGIOGRAPHY CHEST, ABDOMEN AND PELVIS  TECHNIQUE: Multidetector CT imaging through the chest, abdomen and pelvis was performed using the standard protocol during bolus administration of intravenous contrast. Multiplanar reconstructed images and MIPs were obtained and reviewed to evaluate the vascular anatomy.  CONTRAST:  44mL OMNIPAQUE IOHEXOL 350 MG/ML SOLN  COMPARISON:  Chest CT 12/09/2018.  FINDINGS: CTA CHEST FINDINGS  Cardiovascular: Heart size is mildly enlarged. There is no significant pericardial fluid, thickening or pericardial calcification. There is aortic atherosclerosis, as well as atherosclerosis of the great vessels of the mediastinum and the coronary arteries, including calcified atherosclerotic plaque in the left main, left anterior descending, left circumflex and right coronary arteries. Status post median sternotomy for CABG. Severe thickening calcification of the aortic valve.  Mediastinum/Lymph Nodes: No pathologically enlarged mediastinal or hilar  lymph nodes. Esophagus is unremarkable in appearance. No axillary lymphadenopathy.  Lungs/Pleura: Slight enlargement of a small right upper lobe pulmonary nodule (axial image 40 of series 16) which currently measures 9 x 5 mm (mean diameter of 7 mm), previously only a mean diameter of 4 mm. No other larger more suspicious appearing pulmonary nodules or masses are noted. Moderate bilateral pleural effusions lying dependently. This is associated with minimal subsegmental atelectasis in the lung bases bilaterally. No acute consolidative airspace disease.  Musculoskeletal/Soft Tissues: Median sternotomy wires. There are no aggressive appearing lytic or blastic lesions noted in the visualized portions of the skeleton.  CTA ABDOMEN AND PELVIS FINDINGS  Hepatobiliary: Subcentimeter low-attenuation lesion in segment 5 of the liver, too small to characterize, but statistically likely to represent a tiny cyst. Liver has a shrunken appearance and nodular contour, indicative of underlying cirrhosis. No definite suspicious cystic or solid hepatic lesions. No intra or extrahepatic biliary ductal dilatation. Gallbladder is normal in appearance.  Pancreas: No pancreatic mass. No pancreatic ductal dilatation. No pancreatic or peripancreatic fluid collections or inflammatory  changes.  Spleen: Unremarkable.  Adrenals/Urinary Tract: Bilateral kidneys and adrenal glands are normal in appearance. No hydroureteronephrosis. Urinary bladder is normal in appearance.  Stomach/Bowel: Normal appearance of the stomach. No pathologic dilatation of small bowel or colon. Normal appendix.  Vascular/Lymphatic: Aortic atherosclerosis, with vascular findings and measurements pertinent to potential TAVR procedure, as detailed below. No aneurysm or dissection noted in the abdominal or pelvic vasculature. No lymphadenopathy noted in the abdomen or pelvis.  Reproductive: Prostate gland and seminal  vesicles are unremarkable in appearance.  Other: Trace volume of ascites.  No pneumoperitoneum.  Musculoskeletal: There are no aggressive appearing lytic or blastic lesions noted in the visualized portions of the skeleton.  VASCULAR MEASUREMENTS PERTINENT TO TAVR:  AORTA:  Minimal Aortic Diameter-14 x 10 mm  Severity of Aortic Calcification-moderate  RIGHT PELVIS:  Right Common Iliac Artery -  Minimal Diameter-10 x 8.6 mm  Tortuosity-mild  Calcification-moderate  Right External Iliac Artery -  Minimal Diameter-8.3 x 7.6 mm  Tortuosity-moderate to severe  Calcification-mild  Right Common Femoral Artery -  Minimal Diameter-8.2 x 8.6 mm  Tortuosity-mild  Calcification-mild  LEFT PELVIS:  Left Common Iliac Artery -  Minimal Diameter-8.0 x 8.1 mm  Tortuosity-mild-to-moderate  Calcification-moderate  Left External Iliac Artery -  Minimal Diameter-9.0 x 8.5 mm  Tortuosity-moderate  Calcification-none  Left Common Femoral Artery -  Minimal Diameter-8.0 x 8.2 mm  Tortuosity-mild  Calcification-mild  Review of the MIP images confirms the above findings.  IMPRESSION: 1. Vascular findings and measurements pertinent to potential TAVR procedure, as detailed above. 2. Severe thickening calcification of the aortic valve, compatible with reported clinical history of severe aortic stenosis. 3. Enlarging right upper lobe pulmonary nodule measuring 9 x 5 mm (mean diameter of 7 mm), which previously had a mean diameter of only 4 mm on 12/09/2018. This nodule is concerning, but is currently likely below the level of PET-CT resolution. Close attention on repeat noncontrast chest CT is recommended in 3-6 months to reassess this lesion. 4. Moderate bilateral pleural effusions lying dependently with mild dependent subsegmental atelectasis in the lower lobes of the lungs bilaterally. 5. Morphologic changes in the liver  indicative of underlying cirrhosis. Trace volume of ascites. 6. Additional incidental findings, as above.   Electronically Signed   By: Vinnie Langton M.D.   On: 05/13/2019 11:58      STS SCORE Procedure: Isolated AVR   Risk of Mortality: 5.843%  Renal Failure: 9.558%  Permanent Stroke: 1.887%  Prolonged Ventilation: 24.673%  DSW Infection: 0.208%  Reoperation: 8.225%  Morbidity or Mortality: 39.633%  Short Length of Stay: 6.701%  Long Length of Stay: 29.172%     Impression:  Patient has severe global left ventricular systolic dysfunction with dilated nonischemic cardiomyopathy.  He has suffered from chronic combined systolic and diastolic congestive heart failure with longstanding persistent atrial fibrillation for the last several years, although he apparently has been doing better recently on medical therapy as guided by Dr. Harrell Gave.  He currently describes stable symptoms of exertional shortness of breath and fatigue consistent with chronic combined systolic and diastolic congestive heart failure, NYHA functional class II.  I have personally reviewed the patient's most recent transthoracic echocardiogram performed last October, diagnostic cardiac catheterization performed last month, and recent CT angiograms.  Echocardiogram reveals profound left ventricular systolic dysfunction with ejection fraction estimated only 20 to 25% in the setting of probably severe mitral regurgitation.  The left ventricle is dilated.  Aortic valve is trileaflet and calcified.  There is decreased  leaflet mobility.  Mean transvalvular gradient across aortic valve was measured only 2.3 m/s corresponding to mean transvalvular gradient estimated only 12 mmHg but aortic valve area calculated only 0.83 cm and the DVI was notably quite low at 0.18.  This could be consistent with severe low-flow low gradient aortic stenosis.  Echocardiogram also reveals severely restricted mitral valve leaflet mobility  with at least moderate and probably severe mitral regurgitation.   There is type IIIa dysfunction with an eccentric jet of regurgitation directed posteriorly.  The right ventricle appears mildly dilated.  There was mild tricuspid regurgitation.  Recent diagnostic cardiac catheterization confirmed the presence of significant aortic stenosis as well as pulmonary hypertension and large V waves on pulmonary capillary wedge tracing.  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  Cardiac-gated CTA of the heart reveals anatomical characteristics consistent with aortic stenosis suitable for treatment by transcatheter aortic valve replacement without any significant complicating features and CTA of the aorta and iliac vessels demonstrate what appears to be adequate pelvic vascular access to facilitate a transfemoral approach.    Plan:  I attempted to discuss the results of the patient's previous diagnostic tests at length with the patient and his wife in the office today.  Both were surprised to find out that the patient's aortic valve has not already been fixed.  The patient does admit to continued symptoms of exertional shortness of breath and fatigue.  We discussed the possibility that the patient might benefit from transcatheter aortic valve replacement but that I remain very concerned about the severity of the patient's global left ventricular systolic dysfunction and mitral regurgitation.  Both the patient and his wife understand that he would not be considered a candidate for conventional surgery under any circumstances.  At this point I favor proceeding with transesophageal echocardiogram to further characterize the severity of aortic stenosis and mitral regurgitation in an effort to determine whether or not the patient might gain significant benefit from  transcatheter aortic valve replacement.  The patient's case will be further reviewed by a multidisciplinary team of specialists before final recommendations are made.  All questions answered.   I spent in excess of 90 minutes during the conduct of this office consultation and >50% of this time involved direct face-to-face encounter with the patient for counseling and/or coordination of their care.    Valentina Gu. Roxy Manns, MD 05/18/2019 3:38 PM

## 2019-05-18 NOTE — H&P (View-Only) (Signed)
HE[facility]GERY CONSULTATION REPORT  Primary Cardiologist is Buford Dresser, MD PCP is [name], MD  Chief Complaint  Patient presents with  . Aortic Stenosis    Surgical eval for TAVR, review all testing/studies    HPI:  Patient is an 86 year old male with complex past medical history who has been referred for surgical consultation to discuss treatment options for management of suspected low flow low gradient severe aortic stenosis.  Patient's cardiac history dates back to 2004 when he underwent mitral valve repair and coronary artery bypass grafting x1 by Dr. Servando Snare.  By report the patient had mitral valve prolapse and underwent quadrangular resection of the posterior leaflet with artificial Gore-Tex neocords placement and ring annuloplasty using a 28 mm Seguin semirigid ring.  At the time he underwent coronary artery bypass grafting using a reverse greater saphenous vein graft to the obtuse marginal branch of the left circumflex.  The patient states that he recovered from his surgery uneventfully.  The patient developed persistent atrial fibrillation for which she has remained on long-term anticoagulation, initially using warfarin and more recently using Eliquis.  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 Radford Pax.  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  Patient was hospitalized in October 2019 with acute exacerbation of shortness of breath and congestive heart failure.  He required intensive inpatient diuresis with readjustment of his medications, and since that time he has been followed regularly by Dr. Harrell Gave.  The patient has remained in longstanding persistent atrial  fibrillation and both heart rate and symptoms of congestive heart failure have been difficult to control.  Previous echocardiograms have revealed what was felt to be mild aortic stenosis, but more recent echocardiograms have suggested significant progression with the possibility of severe low-flow low gradient aortic stenosis.  Most recent transthoracic echocardiogram performed January 13, 2019 revealed severe global left ventricular systolic dysfunction with ejection fraction estimated only 20 to 25% in the setting of possibly severe mitral regurgitation.  There was moderate to severe left ventricular chamber enlargement but no significant left ventricular hypertrophy.  The aortic valve leaflets appeared severely calcified.  Peak velocity across aortic valve measured 2.3 m/s corresponding to mean transvalvular gradient estimated 12.2 mmHg but the aortic valve area was notably estimated only 0.77 cm with DVI reported 0.18 and stroke-volume index reported 29.16.  Patient was referred to the multidisciplinary heart valve clinic and has been evaluated previously by Dr. Burt Knack.  Diagnostic cardiac catheterization was performed April 28, 2019.  Patient was found to have a mild coronary artery disease with chronic occlusion of the first obtuse marginal branch of the left circumflex but continued patency of the vein graft to the same branch.  Otherwise there was mild diffuse disease in the distal right coronary artery and nonobstructive disease in the left coronary system.  Catheterization confirmed presence of aortic stenosis with mean transvalvular gradient measured 33 mmHg at catheterization corresponding to aortic valve area calculated 0.8 cm.  There was moderate to severe pulmonary hypertension with PA pressures measured 54/17 despite relatively low systemic arterial pressure.  There were large V waves on pulmonary capillary wedge pressure consistent with severe mitral regurgitation.  Mean central venous pressure  was 11 mmHg.  CT angiography was performed and the patient referred  for surgical consultation.  Patient is married and lives locally with his wife.  Both he and his wife came to his office visit today convinced that the patient had already undergone transcatheter aortic valve replacement at the time he underwent catheterization by Dr. Burt Knack last month.  They were surprised to find out that his valve had not been fixed and the patient states that he feels much better since his diagnostic catheterization.  He admits that he gets short of breath with exertion.  He reports that his weight has been stable recently and he has not had recent exacerbation of lower extremity edema.  He denies any chest pain or chest tightness either with activity or at rest.  He admits to some fatigue and decreased energy.  Denies any palpitations, dizzy spells, or syncope.  He reports that his appetite is okay.  Patient and his wife both seem to have trouble with historical recollection.  Past Medical History:  Diagnosis Date  . Adenomatous colon polyp   . Aortic stenosis    a. mild by echo 2017.  [place] CAD (coronary artery disease)    a. S/P CABG x 1 @ time of MV annuloplasty;  b. 02/2010 ETT: neg for ischemia.  . Carotid artery stenosis    1-39% by dopplers 10/2016  . Chronic combined systolic (congestive) and diastolic (congestive) heart failure ([place])   . CKD (chronic kidney disease), stage III   . Current use of long term anticoagulation    a. Coumadin in setting of afib.  . Diverticulosis    Colonoscopy  . Dyslipidemia   . Essential hypertension   . H/O mitral valve repair   . H/O mitral valve repair 09/16/2002   Complex valvuloplasty including quadrangular resection of posterior leaflet with artificial Gore-tex neochords and 28 mm Seguin ring annuloplasty - Dr Servando Snare  . Hemorrhoids   . Hyperlipidemia   . Hypertension   . Permanent atrial fibrillation (HCC)    a. CHA2DS2VASc = 6 -->chronic coumadin;  b. Prev on  amio-->d/c 2/2 hypothyroidism. >> resumed 5/16. c. Notes from 2017 indicate interstitial lung disease by pulm appt, question amio toxicity, also increased liver attenuation. Rate control pursued and amiodarone discontinued.  . S/P CABG x 1 09/16/2002   SVG to OM  . TIA (transient ischemic attack)     Past Surgical History:  Procedure Laterality Date  . CORONARY ARTERY BYPASS GRAFT    . MITRAL VALVE REPAIR    . RIGHT/LEFT HEART CATH AND CORONARY/GRAFT ANGIOGRAPHY N/A 04/28/2019   Procedure: RIGHT/LEFT HEART CATH AND CORONARY/GRAFT ANGIOGRAPHY;  Surgeon: Sherren Mocha, MD;  Location: [facility];  Service: Cardiovascular;  Laterality: N/A;    Family History  Problem Relation Age of Onset  . Stroke Mother        Deceased  . Heart disease Father        Deceased  . Heart failure Brother        Deceased  . Stroke Brother     Social History   Socioeconomic History  . Marital status: Married    Spouse name: Not on file  . Number of children: Not on file  . Years of education: Not on file  . Highest education level: Not on file  Occupational History  . 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 Smoking status: Former Smoker    Packs/day: 0.10    Years: 2.00    Pack years: 0.20    Types: Cigarettes  Quit date: 03/20/1955    Years since quitting: 64.2  . Smokeless tobacco: Never Used  Substance and Sexual Activity  . Alcohol use: No    Alcohol/week: 0.0 standard drinks  . Drug use: No  . Sexual activity: Not Currently  Other Topics Concern  . Not on file  Social History Narrative  . Not on file   Social Determinants of Health   Financial Resource Strain:   . Difficulty of Paying Living Expenses: Not on file  Food Insecurity:   . Worried About Charity fundraiser in the Last Year: Not on file  . Ran Out of Food in the Last Year: Not on file  Transportation Needs:   . Lack of Transportation (Medical): Not on file  . Lack of Transportation  (Non-Medical): Not on file  Physical Activity:   . Days of Exercise per Week: Not on file  . Minutes of Exercise per Session: Not on file  Stress:   . Feeling of Stress : Not on file  Social Connections:   . Frequency of Communication with Friends and Family: Not on file  . Frequency of Social Gatherings with Friends and Family: Not on file  . Attends Religious Services: Not on file  . Active Member of Clubs or Organizations: Not on file  . Attends Archivist Meetings: Not on file  . Marital Status: Not on file  Intimate Partner Violence:   . Fear of Current or Ex-Partner: Not on file  . Emotionally Abused: Not on file  . Physically Abused: Not on file  . Sexually Abused: Not on file    Current Outpatient Medications  Medication Sig Dispense Refill  . amoxicillin (AMOXIL) 500 MG tablet Take 2,000 mg by mouth See admin instructions. Take 2000 mg by mouth one hour prior to dental visits     . apixaban (ELIQUIS) 5 MG TABS tablet Take 2.5 mg by mouth 2 (two) times daily.    . Cholecalciferol (VITAMIN D-3) 1000 units CAPS Take 1,000 Units by mouth daily.     . hydrocortisone (ANUSOL-HC) 2.5 % rectal cream Place 1 application rectally 2 (two) times daily. 30 g 1  . levothyroxine (SYNTHROID) 75 MCG tablet Take 75 mcg by mouth daily before breakfast.     . metoprolol succinate (TOPROL-XL) 50 MG 24 hr tablet Take 1 tablet (50 mg total) by mouth daily. 90 tablet 3  . mirtazapine (REMERON) 15 MG tablet Take 7.5 tablets by mouth daily.     Vladimir Faster Glycol-Propyl Glycol (SYSTANE OP) Place 1 drop into both eyes daily as needed (dry eyes).    . pravastatin (PRAVACHOL) 80 MG tablet Take 80 mg by mouth every Monday, Wednesday, and Friday.     . torsemide (DEMADEX) 20 MG tablet Take 40 mg by mouth 2 (two) times daily.     . vitamin B-12 (CYANOCOBALAMIN) 1000 MCG tablet Take 1,000 mcg by mouth daily.     No current facility-administered medications for this visit.    Allergies    Allergen Reactions  . Asa [Aspirin] Other (See Comments)    Bleeding- has internal hemorrhoids  . Flomax [Tamsulosin] Other (See Comments)    Made his heart go out of rhythm  . Pirfenidone     Weight loss, loss of appetite.      Review of Systems:   General:  fair appetite, decreased energy, no weight gain, no weight loss, no fever  Cardiac:  no chest pain with exertion, no chest pain at rest, +  SOB with exertion, no resting SOB, no PND, no orthopnea, no palpitations, + arrhythmia, + atrial fibrillation, + LE edema, no dizzy spells, no syncope  Respiratory:  + shortness of breath, no home oxygen, no productive cough, no dry cough, no bronchitis, no wheezing, no hemoptysis, no asthma, no pain with inspiration or cough, no sleep apnea, no CPAP at night  GI:   no difficulty swallowing, no reflux, no frequent heartburn, no hiatal hernia, no abdominal pain, no constipation, no diarrhea, no hematochezia, no hematemesis, no melena  GU:   no dysuria,  no frequency, no urinary tract infection, no hematuria, no enlarged prostate, no kidney stones, + kidney disease  Vascular:  no pain suggestive of claudication, no pain in feet, no leg cramps, no varicose veins, no DVT, no non-healing foot ulcer  Neuro:   no stroke, no TIA's, no seizures, no headaches, no temporary blindness one eye,  no slurred speech, no peripheral neuropathy, no chronic pain, no instability of gait, no memory/cognitive dysfunction  Musculoskeletal: + arthritis, no joint swelling, no myalgias, no difficulty walking, normal mobility   Skin:   no rash, no itching, no skin infections, no pressure sores or ulcerations  Psych:   no anxiety, no depression, no nervousness, no unusual recent stress  Eyes:   no blurry vision, no floaters, no recent vision changes, + wears glasses or contacts  ENT:   + hearing loss, no loose or painful teeth, no dentures, last saw dentist 1.5 years ago  Hematologic:  no easy bruising, no abnormal bleeding, no  clotting disorder, no frequent epistaxis  Endocrine:  no diabetes, does not check CBG's at home     Physical Exam:   BP 109/76 (BP Location: Left Arm, Patient Position: Sitting, Cuff Size: Normal)   Pulse 86   Temp 97.9 F (36.6 C)   Resp 16   Ht 5\' 8"  (1.727 m)   Wt 157 lb (71.2 kg)   SpO2 99% Comment: RA  BMI 23.87 kg/m   General:  Elderly male NAD  we  HEENT:  Unremarkable   Neck:   no JVD, no bruits, no adenopathy   Chest:   clear to auscultation, symmetrical breath sounds, no wheezes, no rhonchi   CV:   RRR, grade III/VI systolic murmur   Abdomen:  soft, non-tender, no masses   Extremities:  warm, well-perfused, pulses diminished, mild LE edema  Rectal/GU  Deferred  Neuro:   Grossly non-focal and symmetrical throughout  Skin:   Clean and dry, no rashes, no breakdown   Diagnostic Tests:  ECHOCARDIOGRAM REPORT       Patient Name:  [name] Date of Exam: 01/13/2019  Medical Rec #: [id number]   Height:    68.0 in  Accession #:  [id number]   Weight:    175.6 lb  Date of Birth: Dec 05, 1931   BSA:     1.93 m  Patient Age:  73 years    BP:      118/88 mmHg  Patient Gender: M       HR:      80 bpm.  Exam Location: [address]   Procedure: 2D Echo, Cardiac Doppler and Color Doppler   Indications:  R01.1    History:    Patient has prior history of Echocardiogram examinations,  most         recent 12/25/2017. CHF, CAD, Prior CABG, MV repair;         Arrythmias:Atrial Fibrillation Signs/Symptoms:Murmur Risk  Factors:Hypertension and Dyslipidemia. Mitral Valve: A  repair         valve is present in the mitral position.    Sonographer:  Coralyn Helling RDCS  Referring Phys: [id number] [place]    1. Left ventricular ejection fraction, by visual estimation, is 20 to  25%. The left ventricle has severely decreased function. Moderate to    severely increased left ventricular size. There is no left ventricular  hypertrophy.  2. Left ventricular diastolic function could not be evaluated pattern of  LV diastolic filling.  3. Global right ventricle has normal systolic function.The right  ventricular size is mildly enlarged. No increase in right ventricular wall  thickness.  4. Left atrial size was moderately dilated.  5. Right atrial size was moderately dilated.  6. Moderate calcification of the mitral valve leaflet(s).  7. Moderate thickening of the mitral valve leaflet(s).  8. Severely decreased mobility of the mitral valve leaflets.  9. The mitral valve has been repaired/replaced. Moderate to severe mitral  valve regurgitation.  10. The tricuspid valve is normal in structure. Tricuspid valve  regurgitation is mild.  11. The aortic valve is abnormal Aortic valve regurgitation is mild by  color flow Doppler.  12. There is Severe calcifcation of the aortic valve.  13. There is Severely thickening of the aortic valve.  14. Suspect low flow-low gradient AS given visual limited movement of  aortic valve and severely reduced LV EF.  15. The pulmonic valve was grossly normal. Pulmonic valve regurgitation is  mild to moderate by color flow Doppler.  16. Moderately elevated pulmonary artery systolic pressure.  17. The inferior vena cava is normal in size with <50% respiratory  variability, suggesting right atrial pressure of 8 mmHg.  18. While heart rate much improved on this study, side by side comparison  shows dilation of the LV and stable severely reduced LV EF. The aortic  valve is severely thickened/calcified and visually appears to minimally  open. Suspect low flow low gradient  severe AS, as dimensionless index 0.18. Prior MV repair with immobile  posterior mitral valve leaflet.   FINDINGS  Left Ventricle: Left ventricular ejection fraction, by visual estimation,  is 20 to 25%. The left ventricle has  severely decreased function. There is  no left ventricular hypertrophy. Moderate to severely increased left  ventricular size. Spectral Doppler  shows Left ventricular diastolic function could not be evaluated pattern  of LV diastolic filling.   Right Ventricle: The right ventricular size is mildly enlarged. No  increase in right ventricular wall thickness. Global RV systolic function  is has normal systolic function. The tricuspid regurgitant velocity is  3.04 m/s, and with an assumed right atrial  pressure of 8 mmHg, the estimated right ventricular systolic pressure is  moderately elevated at 44.9 mmHg.   Left Atrium: Left atrial size was moderately dilated.   Right Atrium: Right atrial size was moderately dilated   Pericardium: There is no evidence of pericardial effusion.   Mitral Valve: The mitral valve has been repaired/replaced. There is  moderate thickening of the mitral valve leaflet(s). There is moderate  calcification of the mitral valve leaflet(s). Severely decreased mobility  of the mitral valve leaflets. Moderate to  severe mitral valve regurgitation. Fixed posterior MV leaflet.   Tricuspid Valve: The tricuspid valve is normal in structure. Tricuspid  valve regurgitation is mild by color flow Doppler.   Aortic Valve: The aortic valve is abnormal. There is Severely thickening  and Severe  calcifcation of the aortic valve. Aortic valve regurgitation is  mild by color flow Doppler. There is Severely thickening of the aortic  valve. Severe calcifcation. Aortic  valve mean gradient measures 12.2 mmHg. Aortic valve peak gradient  measures 21.7 mmHg. Aortic valve area, by VTI measures 0.83 cm. Suspect  low flow-low gradient AS given visual limited movement of aortic valve and  severely reduced LV EF.   Pulmonic Valve: The pulmonic valve was grossly normal. Pulmonic valve  regurgitation is mild to moderate by color flow Doppler.   Aorta: The aortic root and ascending  aorta are structurally normal, with  no evidence of dilitation.   Venous: The inferior vena cava is normal in size with less than 50%  respiratory variability, suggesting right atrial pressure of 8 mmHg.   IAS/Shunts: No atrial level shunt detected by color flow Doppler.      LEFT VENTRICLE  PLAX 2D  LVIDd:     6.10 cm  LVIDs:     5.20 cm  LV PW:     1.10 cm  LV IVS:    0.90 cm  LVOT diam:   2.40 cm  LV SV:     57 ml  LV SV Index:  29.16  LVOT Area:   4.52 cm     RIGHT VENTRICLE      IVC  RVSP:      39.9 mmHg IVC diam: 2.00 cm   LEFT ATRIUM       Index    RIGHT ATRIUM      Index  LA diam:    4.50 cm 2.33 cm/m RA Pressure: 3.00 mmHg  LA Vol (A2C):  85.3 ml 44.11 ml/m RA Area:   21.20 cm  LA Vol (A4C):  91.5 ml 47.32 ml/m RA Volume:  71.60 ml 37.03 ml/m  LA Biplane Vol: 96.1 ml 49.69 ml/m  AORTIC VALVE  AV Area (Vmax):  0.77 cm  AV Area (Vmean):  0.78 cm  AV Area (VTI):   0.83 cm  AV Vmax:      232.80 cm/s  AV Vmean:     161.000 cm/s  AV VTI:      0.419 m  AV Peak Grad:   21.7 mmHg  AV Mean Grad:   12.2 mmHg  LVOT Vmax:     39.66 cm/s  LVOT Vmean:    27.780 cm/s  LVOT VTI:     0.077 m  LVOT/AV VTI ratio: 0.18    AORTA  Ao Root diam: 3.10 cm  Ao Asc diam: 3.20 cm   MITRAL VALVE            TRICUSPID VALVE  MV Area (PHT):           TR Peak grad:  36.9 mmHg                   TR Vmax:    329.00 cm/s  MV Decel Time: 190 msec       Estimated RAP: 3.00 mmHg  MV E velocity: 168.20 cm/s 103 cm/s RVSP:      39.9 mmHg                     SHUNTS                   Systemic VTI: 0.08 m                   Systemic Diam: 2.40 cm     [place]  Harrell Gave MD  Electronically signed by Buford Dresser MD  Signature Date/Time:  01/13/2019/1:19:49 PM       RIGHT/LEFT HEART CATH AND CORONARY/GRAFT ANGIOGRAPHY  Conclusion  1.  Nonobstructive coronary disease with mild diffuse stenosis of the LAD, left circumflex, and RCA 2.  Total occlusion of the first OM branch of the circumflex with continued patency of the saphenous vein graft to OM 3.  Severe aortic stenosis with mean gradient 33 mmHg and calculated valve area 0.8 cm 4.  Large V waves consistent with severe mitral regurgitation  Recommendation: Continue multidisciplinary evaluation for this patient with severe low-flow low gradient aortic stenosis, probable severe mitral regurgitation with previous mitral repair, and progressive heart failure symptoms  Indications  Severe aortic stenosis [I35.0 (ICD-10-CM)]  Procedural Details  Technical Details INDICATION: 86 year old gentleman with progressive heart failure and worsening LV function.  He has developed severe calcific aortic stenosis felt to be low flow low gradient aortic stenosis.  He has a history of single-vessel CABG and mitral valve repair.  He is also developed moderately severe mitral regurgitation.  He presents for hemodynamic assessment and assessment of progressive ischemic disease as a potential etiology of his worsening heart failure.  PROCEDURAL DETAILS: There was an indwelling IV in a right antecubital vein. Using normal sterile technique, the IV was changed out for a 5 Fr brachial sheath over a 0.018 inch wire. The left wrist was then prepped, draped, and anesthetized with 1% lidocaine. Using the modified Seldinger technique a 5/6 French Slender sheath was placed in the left radial artery. Intra-arterial verapamil was administered through the radial artery sheath. IV heparin was administered after a JR4 catheter was advanced into the central aorta. A Swan-Ganz catheter was used for the right heart catheterization. Standard protocol was followed for recording of right heart pressures and sampling  of oxygen saturations. Fick cardiac output was calculated. Standard Judkins catheters were used for selective coronary angiography and saphenous vein graft angiography.  The aortic valve was crossed with an AL-1 catheter and straight tip wire.  LV pressure is recorded and an aortic valve pullback is performed. There were no immediate procedural complications. The patient was transferred to the post catheterization recovery area for further monitoring.    Estimated blood loss <50 mL.   During this procedure medications were administered to achieve and maintain moderate conscious sedation while the patient's heart rate, blood pressure, and oxygen saturation were continuously monitored and I was present face-to-face 100% of this time.  Medications (Filter: Administrations occurring from 04/28/19 1010 to 04/28/19 1130) (important)  Continuous medications are totaled by the amount administered until 04/28/19 1130.  fentaNYL (SUBLIMAZE) injection (mcg) Total dose:  25 mcg Date/Time  Rate/Dose/Volume Action  04/28/19 1038  25 mcg Given    midazolam (VERSED) injection (mg) Total dose:  1 mg Date/Time  Rate/Dose/Volume Action  04/28/19 1038  1 mg Given    Heparin (Porcine) in NaCl 1000-0.9 UT/500ML-% SOLN (mL) Total volume:  1,000 mL Date/Time  Rate/Dose/Volume Action  04/28/19 1038  500 mL Given  1038  500 mL Given    lidocaine (PF) (XYLOCAINE) 1 % injection (mL) Total volume:  5 mL Date/Time  Rate/Dose/Volume Action  04/28/19 1039  5 mL Given    Radial Cocktail/Verapamil only (mL) Total volume:  10 mL Date/Time  Rate/Dose/Volume Action  04/28/19 1039  10 mL Given    heparin injection (Units) Total dose:  3,500 Units Date/Time  Rate/Dose/Volume Action  04/28/19 1058  3,500 Units Given  iohexol (OMNIPAQUE) 350 MG/ML injection (mL) Total volume:  40 mL Date/Time  Rate/Dose/Volume Action  04/28/19 1118  40 mL Given    Sedation Time  Sedation Time Physician-1: 35 minutes 23  seconds  Contrast  Medication Name Total Dose  iohexol (OMNIPAQUE) 350 MG/ML injection 40 mL    Radiation/Fluoro  Fluoro time: 6.3 (min) DAP: 23.8 (Gycm2) Cumulative Air Kerma: 289 (mGy)  Coronary Findings  Diagnostic Dominance: Right Left Anterior Descending  There is mild diffuse disease throughout the vessel. The LAD is patent throughout. There is mild nonobstructive disease present. The vessel reaches the LV apex. The diagonal branches are small in caliber with mild diffuse disease.  Left Circumflex  There is mild diffuse disease throughout the vessel. The circumflex is a large vessel. There is a large second OM branch with mild diffuse nonobstructive disease noted. The first OM is occluded and fills from the graft.  First Obtuse Marginal Branch  1st Mrg lesion 100% stenosed  1st Mrg lesion is 100% stenosed.  Right Coronary Artery  There is mild diffuse disease throughout the vessel. Large, dominant vessel. There is diffuse nonobstructive stenosis less than 50% present. PDA and PLA branches are supplied by the RCA.  Mid RCA lesion 50% stenosed  Mid RCA lesion is 50% stenosed.  Saphenous Graft To 1st Mrg  SVG. The saphenous vein graft to first OM is patent without stenosis.  Intervention  No interventions have been documented. Right Heart  Right Heart Pressures Hemodynamic findings consistent with mitral valve regurgitation.  Left Heart  Aortic Valve There is severe aortic valve stenosis. The aortic valve is calcified. There is restricted aortic valve motion. Mean gradient 33 mmHg, calculated aortic valve area 0.8 cm  Coronary Diagrams  Diagnostic Dominance: Right  Intervention  Implants   No implant documentation for this case.  Syngo Images  Show images for CARDIAC CATHETERIZATION  Images on [employer]  Show images for Deston, Bilyeu to Procedure Log  Procedure Log    Hemo Data   Most Recent Value  Fick Cardiac Output 3.99 L/min   Fick Cardiac Output Index 2.18 (L/min)/BSA  Aortic Mean Gradient 32.47 mmHg  Aortic Peak Gradient 32 mmHg  Aortic Valve Area 0.83  Aortic Value Area Index 0.45 cm2/BSA  RA A Wave 10 mmHg  RA V Wave 13 mmHg  RA Mean 11 mmHg  RV Systolic Pressure 54 mmHg  RV Diastolic Pressure 6 mmHg  RV EDP 11 mmHg  PA Systolic Pressure 54 mmHg  PA Diastolic Pressure 17 mmHg  PA Mean 32 mmHg  PW A Wave 16 mmHg  PW V Wave 32 mmHg  PW Mean 19 mmHg  AO Systolic Pressure 99 mmHg  AO Diastolic Pressure 47 mmHg  AO Mean 66 mmHg  LV Systolic Pressure 841 mmHg  LV Diastolic Pressure 7 mmHg  LV EDP 13 mmHg  AOp Systolic Pressure 660 mmHg  AOp Diastolic Pressure 43 mmHg  AOp Mean Pressure 65 mmHg  LVp Systolic Pressure 630 mmHg  LVp Diastolic Pressure 9 mmHg  LVp EDP Pressure 16 mmHg  QP/QS 1  TPVR Index 14.69 HRUI  TSVR Index 30.29 HRUI  PVR SVR Ratio 0.24  TPVR/TSVR Ratio 0.48    Cardiac TAVR CT  TECHNIQUE: The patient was scanned on a [employer]. A 120 kV retrospective scan was triggered in the descending thoracic aorta at 111 HU's. Gantry rotation speed was 250 msecs and collimation was .6 mm. No beta blockade or nitro  were given. The 3D data set was reconstructed in 5% intervals of the R-R cycle. Systolic and diastolic phases were analyzed on a dedicated work station using MPR, MIP and VRT modes. The patient received 80 cc of contrast.  FINDINGS: Aortic Valve: Trileaflet aortic valve with moderately calcified leaflets, severely reduced leaflets opening and no calcifications extending into the LVOT.  Aorta: Normal size with mild diffuse calcifications and no dissection.  Sinotubular Junction: 30 x 28 mm  Ascending Thoracic Aorta: 31 x 31 mm  Aortic Arch: 28 x 27 mm  Descending Thoracic Aorta: 21 x 20 mm  Sinus of Valsalva Measurements:  Non-coronary: 37 mm  Right -coronary: 33 mm  Left -coronary: 34 mm  Virtual Basal Annulus  Measurements:  Maximum/Minimum Diameter: 30.0 x 36.5 mm  Mean Diameter: 27.3 mm  Perimeter: 88.6 mm  Area: 586 mm2  Coronary Artery Height above Annulus:  Left Main: 17 mm  Right Coronary: 22 mm  Optimum Fluoroscopic Angle for Delivery: RAO 2 CRA 2  IMPRESSION: 1. Trileaflet aortic valve with moderately calcified leaflets, severely reduced leaflets opening and no calcifications extending into the LVOT. Aortic valve calcium score 2655 consistent with severe aortic stenosis. Annular measurements suitable for delivery of a 29 mm Edwrads-SAPIEN 3 Ultra valve.  2. Sufficient coronary to annulus distance.  3. Optimum Fluoroscopic Angle for Delivery: RAO 2 CRA 2.  4. No thrombus in the left atrial appendage.  5. Dilated pulmonary artery measuring 34 mm suggestive of pulmonary hypertension.   Electronically Signed   By: Ena Dawley   On: 05/16/2019 00:32       CT ANGIOGRAPHY CHEST, ABDOMEN AND PELVIS  TECHNIQUE: Multidetector CT imaging through the chest, abdomen and pelvis was performed using the standard protocol during bolus administration of intravenous contrast. Multiplanar reconstructed images and MIPs were obtained and reviewed to evaluate the vascular anatomy.  CONTRAST:  55mL OMNIPAQUE IOHEXOL 350 MG/ML SOLN  COMPARISON:  Chest CT 12/09/2018.  FINDINGS: CTA CHEST FINDINGS  Cardiovascular: Heart size is mildly enlarged. There is no significant pericardial fluid, thickening or pericardial calcification. There is aortic atherosclerosis, as well as atherosclerosis of the great vessels of the mediastinum and the coronary arteries, including calcified atherosclerotic plaque in the left main, left anterior descending, left circumflex and right coronary arteries. Status post median sternotomy for CABG. Severe thickening calcification of the aortic valve.  Mediastinum/Lymph Nodes: No pathologically enlarged mediastinal or hilar  lymph nodes. Esophagus is unremarkable in appearance. No axillary lymphadenopathy.  Lungs/Pleura: Slight enlargement of a small right upper lobe pulmonary nodule (axial image 40 of series 16) which currently measures 9 x 5 mm (mean diameter of 7 mm), previously only a mean diameter of 4 mm. No other larger more suspicious appearing pulmonary nodules or masses are noted. Moderate bilateral pleural effusions lying dependently. This is associated with minimal subsegmental atelectasis in the lung bases bilaterally. No acute consolidative airspace disease.  Musculoskeletal/Soft Tissues: Median sternotomy wires. There are no aggressive appearing lytic or blastic lesions noted in the visualized portions of the skeleton.  CTA ABDOMEN AND PELVIS FINDINGS  Hepatobiliary: Subcentimeter low-attenuation lesion in segment 5 of the liver, too small to characterize, but statistically likely to represent a tiny cyst. Liver has a shrunken appearance and nodular contour, indicative of underlying cirrhosis. No definite suspicious cystic or solid hepatic lesions. No intra or extrahepatic biliary ductal dilatation. Gallbladder is normal in appearance.  Pancreas: No pancreatic mass. No pancreatic ductal dilatation. No pancreatic or peripancreatic fluid collections or inflammatory  changes.  Spleen: Unremarkable.  Adrenals/Urinary Tract: Bilateral kidneys and adrenal glands are normal in appearance. No hydroureteronephrosis. Urinary bladder is normal in appearance.  Stomach/Bowel: Normal appearance of the stomach. No pathologic dilatation of small bowel or colon. Normal appendix.  Vascular/Lymphatic: Aortic atherosclerosis, with vascular findings and measurements pertinent to potential TAVR procedure, as detailed below. No aneurysm or dissection noted in the abdominal or pelvic vasculature. No lymphadenopathy noted in the abdomen or pelvis.  Reproductive: Prostate gland and seminal  vesicles are unremarkable in appearance.  Other: Trace volume of ascites.  No pneumoperitoneum.  Musculoskeletal: There are no aggressive appearing lytic or blastic lesions noted in the visualized portions of the skeleton.  VASCULAR MEASUREMENTS PERTINENT TO TAVR:  AORTA:  Minimal Aortic Diameter-14 x 10 mm  Severity of Aortic Calcification-moderate  RIGHT PELVIS:  Right Common Iliac Artery -  Minimal Diameter-10 x 8.6 mm  Tortuosity-mild  Calcification-moderate  Right External Iliac Artery -  Minimal Diameter-8.3 x 7.6 mm  Tortuosity-moderate to severe  Calcification-mild  Right Common Femoral Artery -  Minimal Diameter-8.2 x 8.6 mm  Tortuosity-mild  Calcification-mild  LEFT PELVIS:  Left Common Iliac Artery -  Minimal Diameter-8.0 x 8.1 mm  Tortuosity-mild-to-moderate  Calcification-moderate  Left External Iliac Artery -  Minimal Diameter-9.0 x 8.5 mm  Tortuosity-moderate  Calcification-none  Left Common Femoral Artery -  Minimal Diameter-8.0 x 8.2 mm  Tortuosity-mild  Calcification-mild  Review of the MIP images confirms the above findings.  IMPRESSION: 1. Vascular findings and measurements pertinent to potential TAVR procedure, as detailed above. 2. Severe thickening calcification of the aortic valve, compatible with reported clinical history of severe aortic stenosis. 3. Enlarging right upper lobe pulmonary nodule measuring 9 x 5 mm (mean diameter of 7 mm), which previously had a mean diameter of only 4 mm on 12/09/2018. This nodule is concerning, but is currently likely below the level of PET-CT resolution. Close attention on repeat noncontrast chest CT is recommended in 3-6 months to reassess this lesion. 4. Moderate bilateral pleural effusions lying dependently with mild dependent subsegmental atelectasis in the lower lobes of the lungs bilaterally. 5. Morphologic changes in the liver  indicative of underlying cirrhosis. Trace volume of ascites. 6. Additional incidental findings, as above.   Electronically Signed   By: Vinnie Langton M.D.   On: 05/13/2019 11:58      Impression:  Patient has severe global left ventricular systolic dysfunction with dilated nonischemic cardiomyopathy.  He has suffered from chronic combined systolic and diastolic congestive heart failure with longstanding persistent atrial fibrillation for the last several years, although he apparently has been doing better recently on medical therapy as guided by Dr. Harrell Gave.  I have personally reviewed the patient's most recent transthoracic echocardiogram performed last October, diagnostic cardiac catheterization performed last month, and recent CT angiograms.  Echocardiogram reveals profound left ventricular systolic dysfunction with ejection fraction estimated only 20 to 25% in the setting of probably severe mitral regurgitation.  The left ventricle is dilated.  Aortic valve is trileaflet and calcified.  There is decreased leaflet mobility.  Mean transvalvular gradient across aortic valve was measured only 2.3 m/s corresponding to mean transvalvular gradient estimated only 12 mmHg but aortic valve area calculated only 0.83 cm and the DVI was notably quite low at 0.18.  This could be consistent with severe low-flow low gradient aortic stenosis.  Echocardiogram also reveals severely restricted mitral valve leaflet mobility with at least moderate and probably severe mitral regurgitation.   There is type IIIa dysfunction  with an eccentric jet of regurgitation directed posteriorly.  The right ventricle appears mildly dilated.  There was mild tricuspid regurgitation.  Recent diagnostic cardiac catheterization confirmed the presence of significant aortic stenosis as well as pulmonary hypertension and large V waves on pulmonary capillary wedge tracing.  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  Cardiac-gated CTA of the heart reveals anatomical characteristics consistent with aortic stenosis suitable for treatment by transcatheter aortic valve replacement without any significant complicating features and CTA of the aorta and iliac vessels demonstrate what appears to be adequate pelvic vascular access to facilitate a transfemoral approach.    Plan:  I attempted to discuss the results of the patient's previous diagnostic tests at length with the patient and his wife in the office today.  Both were surprised to find out that the patient's aortic valve has not already been fixed.  We discussed the possibility that the patient might benefit from transcatheter aortic valve replacement but that I remain very concerned about the severity of the patient's global left ventricular systolic dysfunction and mitral regurgitation.  Both the patient and his wife understand that he would not be considered a candidate for conventional surgery under any circumstances.  At this point I favor proceeding with transesophageal echocardiogram to further characterize the severity of aortic stenosis and mitral regurgitation in an effort to determine whether or not the patient might gain significant benefit from transcatheter aortic valve replacement.  The patient's case will be further reviewed by a multidisciplinary team of specialists before final recommendations are made.  All questions answered.   I spent in excess of 90 minutes during the conduct of this office consultation and >50% of this time involved direct face-to-face encounter with the patient for counseling and/or coordination of their care.    Valentina Gu. Roxy Manns, MD 05/18/2019 3:38 PM

## 2019-05-18 NOTE — H&P (View-Only) (Signed)
[facility] CONSULTATION REPORT  Primary Cardiologist is Buford Dresser, MD PCP is [name], MD  Chief Complaint  Patient presents with  . Aortic Stenosis    Surgical eval for TAVR, review all testing/studies    HPI:  Patient is an 87 year old male with complex past medical history who has been referred for surgical consultation to discuss treatment options for management of suspected low flow low gradient severe aortic stenosis.  Patient's cardiac history dates back to 2004 when he underwent mitral valve repair and coronary artery bypass grafting x1 by Dr. Servando Snare.  By report the patient had mitral valve prolapse and underwent quadrangular resection of the posterior leaflet with artificial Gore-Tex neocords placement and ring annuloplasty using a 28 mm Seguin semirigid ring.  At the time he underwent coronary artery bypass grafting using a reverse greater saphenous vein graft to the obtuse marginal branch of the left circumflex.  The patient states that he recovered from his surgery uneventfully.  The patient developed persistent atrial fibrillation for which she has remained on long-term anticoagulation, initially using warfarin and more recently using Eliquis.  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 Radford Pax.  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  Patient was hospitalized in October 2019 with acute exacerbation of shortness of breath and congestive heart failure.  He required intensive inpatient diuresis with readjustment of his medications, and since that time he has been followed regularly by Dr. Harrell Gave.  The patient has remained in longstanding persistent atrial  fibrillation and both heart rate and symptoms of congestive heart failure have been difficult to control.  Previous echocardiograms have revealed what was felt to be mild aortic stenosis, but more recent echocardiograms have suggested significant progression with the possibility of severe low-flow low gradient aortic stenosis.  Most recent transthoracic echocardiogram performed January 13, 2019 revealed severe global left ventricular systolic dysfunction with ejection fraction estimated only 20 to 25% in the setting of possibly severe mitral regurgitation.  There was moderate to severe left ventricular chamber enlargement but no significant left ventricular hypertrophy.  The aortic valve leaflets appeared severely calcified.  Peak velocity across aortic valve measured 2.3 m/s corresponding to mean transvalvular gradient estimated 12.2 mmHg but the aortic valve area was notably estimated only 0.77 cm with DVI reported 0.18 and stroke-volume index reported 29.16.  Patient was referred to the multidisciplinary heart valve clinic and has been evaluated previously by Dr. Burt Knack.  Diagnostic cardiac catheterization was performed April 28, 2019.  Patient was found to have a mild coronary artery disease with chronic occlusion of the first obtuse marginal branch of the left circumflex but continued patency of the vein graft to the same branch.  Otherwise there was mild diffuse disease in the distal right coronary artery and nonobstructive disease in the left coronary system.  Catheterization confirmed presence of aortic stenosis with mean transvalvular gradient measured 33 mmHg at catheterization corresponding to aortic valve area calculated 0.8 cm.  There was moderate to severe pulmonary hypertension with PA pressures measured 54/17 despite relatively low systemic arterial pressure.  There were large V waves on pulmonary capillary wedge pressure consistent with severe mitral regurgitation.  Mean central venous pressure  was 11 mmHg.  CT angiography was performed and the patient referred  for surgical consultation.  Patient is married and lives locally with his wife.  Both he and his wife came to his office visit today convinced that the patient had already undergone transcatheter aortic valve replacement at the time he underwent catheterization by Dr. Burt Knack last month.  They were surprised to find out that his valve had not been fixed and the patient states that he feels much better since his diagnostic catheterization.  He admits that he gets short of breath with exertion.  He reports that his weight has been stable recently and he has not had recent exacerbation of lower extremity edema.  He denies any chest pain or chest tightness either with activity or at rest.  He admits to some fatigue and decreased energy.  Denies any palpitations, dizzy spells, or syncope.  He reports that his appetite is okay.  Patient and his wife both seem to have trouble with historical recollection.  Past Medical History:  Diagnosis Date  . Adenomatous colon polyp   . Aortic stenosis    a. mild by echo 2017.  Marland Kitchen CAD (coronary artery disease)    a. S/P CABG x 1 @ time of MV annuloplasty;  b. 02/2010 ETT: neg for ischemia.  . Carotid artery stenosis    1-39% by dopplers 10/2016  . Chronic combined systolic (congestive) and diastolic (congestive) heart failure ([place])   . CKD (chronic kidney disease), stage III   . Current use of long term anticoagulation    a. Coumadin in setting of afib.  . Diverticulosis    Colonoscopy  . Dyslipidemia   . Essential hypertension   . H/O mitral valve repair   . H/O mitral valve repair 09/16/2002   Complex valvuloplasty including quadrangular resection of posterior leaflet with artificial Gore-tex neochords and 28 mm Seguin ring annuloplasty - Dr Servando Snare  . Hemorrhoids   . Hyperlipidemia   . Hypertension   . Permanent atrial fibrillation (HCC)    a. CHA2DS2VASc = 6 -->chronic coumadin;  b. Prev on  amio-->d/c 2/2 hypothyroidism. >> resumed 5/16. c. Notes from 2017 indicate interstitial lung disease by pulm appt, question amio toxicity, also increased liver attenuation. Rate control pursued and amiodarone discontinued.  . S/P CABG x 1 09/16/2002   SVG to OM  . TIA (transient ischemic attack)     Past Surgical History:  Procedure Laterality Date  . CORONARY ARTERY BYPASS GRAFT    . MITRAL VALVE REPAIR    . RIGHT/LEFT HEART CATH AND CORONARY/GRAFT ANGIOGRAPHY N/A 04/28/2019   Procedure: RIGHT/LEFT HEART CATH AND CORONARY/GRAFT ANGIOGRAPHY;  Surgeon: Sherren Mocha, MD;  Location: [facility];  Service: Cardiovascular;  Laterality: N/A;    Family History  Problem Relation Age of Onset  . Stroke Mother        Deceased  . Heart disease Father        Deceased  . Heart failure Brother        Deceased  . Stroke Brother     Social History   Socioeconomic History  . Marital status: Married    Spouse name: Not on file  . Number of children: Not on file  . Years of education: Not on file  . Highest education level: Not on file  Occupational History  . 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 Smoking status: Former Smoker    Packs/day: 0.10    Years: 2.00    Pack years: 0.20    Types: Cigarettes  Quit date: 03/20/1955    Years since quitting: 64.2  . Smokeless tobacco: Never Used  Substance and Sexual Activity  . Alcohol use: No    Alcohol/week: 0.0 standard drinks  . Drug use: No  . Sexual activity: Not Currently  Other Topics Concern  . Not on file  Social History Narrative  . Not on file   Social Determinants of Health   Financial Resource Strain:   . Difficulty of Paying Living Expenses: Not on file  Food Insecurity:   . Worried About Charity fundraiser in the Last Year: Not on file  . Ran Out of Food in the Last Year: Not on file  Transportation Needs:   . Lack of Transportation (Medical): Not on file  . Lack of Transportation  (Non-Medical): Not on file  Physical Activity:   . Days of Exercise per Week: Not on file  . Minutes of Exercise per Session: Not on file  Stress:   . Feeling of Stress : Not on file  Social Connections:   . Frequency of Communication with Friends and Family: Not on file  . Frequency of Social Gatherings with Friends and Family: Not on file  . Attends Religious Services: Not on file  . Active Member of Clubs or Organizations: Not on file  . Attends Archivist Meetings: Not on file  . Marital Status: Not on file  Intimate Partner Violence:   . Fear of Current or Ex-Partner: Not on file  . Emotionally Abused: Not on file  . Physically Abused: Not on file  . Sexually Abused: Not on file    Current Outpatient Medications  Medication Sig Dispense Refill  . amoxicillin (AMOXIL) 500 MG tablet Take 2,000 mg by mouth See admin instructions. Take 2000 mg by mouth one hour prior to dental visits     . apixaban (ELIQUIS) 5 MG TABS tablet Take 2.5 mg by mouth 2 (two) times daily.    . Cholecalciferol (VITAMIN D-3) 1000 units CAPS Take 1,000 Units by mouth daily.     . hydrocortisone (ANUSOL-HC) 2.5 % rectal cream Place 1 application rectally 2 (two) times daily. 30 g 1  . levothyroxine (SYNTHROID) 75 MCG tablet Take 75 mcg by mouth daily before breakfast.     . metoprolol succinate (TOPROL-XL) 50 MG 24 hr tablet Take 1 tablet (50 mg total) by mouth daily. 90 tablet 3  . mirtazapine (REMERON) 15 MG tablet Take 7.5 tablets by mouth daily.     Vladimir Faster Glycol-Propyl Glycol (SYSTANE OP) Place 1 drop into both eyes daily as needed (dry eyes).    . pravastatin (PRAVACHOL) 80 MG tablet Take 80 mg by mouth every Monday, Wednesday, and Friday.     . torsemide (DEMADEX) 20 MG tablet Take 40 mg by mouth 2 (two) times daily.     . vitamin B-12 (CYANOCOBALAMIN) 1000 MCG tablet Take 1,000 mcg by mouth daily.     No current facility-administered medications for this visit.    Allergies    Allergen Reactions  . Asa [Aspirin] Other (See Comments)    Bleeding- has internal hemorrhoids  . Flomax [Tamsulosin] Other (See Comments)    Made his heart go out of rhythm  . Pirfenidone     Weight loss, loss of appetite.      Review of Systems:   General:  fair appetite, decreased energy, no weight gain, no weight loss, no fever  Cardiac:  no chest pain with exertion, no chest pain at rest, +  SOB with exertion, no resting SOB, no PND, no orthopnea, no palpitations, + arrhythmia, + atrial fibrillation, + LE edema, no dizzy spells, no syncope  Respiratory:  + shortness of breath, no home oxygen, no productive cough, no dry cough, no bronchitis, no wheezing, no hemoptysis, no asthma, no pain with inspiration or cough, no sleep apnea, no CPAP at night  GI:   no difficulty swallowing, no reflux, no frequent heartburn, no hiatal hernia, no abdominal pain, no constipation, no diarrhea, no hematochezia, no hematemesis, no melena  GU:   no dysuria,  no frequency, no urinary tract infection, no hematuria, no enlarged prostate, no kidney stones, + kidney disease  Vascular:  no pain suggestive of claudication, no pain in feet, no leg cramps, no varicose veins, no DVT, no non-healing foot ulcer  Neuro:   no stroke, no TIA's, no seizures, no headaches, no temporary blindness one eye,  no slurred speech, no peripheral neuropathy, no chronic pain, no instability of gait, no memory/cognitive dysfunction  Musculoskeletal: + arthritis, no joint swelling, no myalgias, no difficulty walking, normal mobility   Skin:   no rash, no itching, no skin infections, no pressure sores or ulcerations  Psych:   no anxiety, no depression, no nervousness, no unusual recent stress  Eyes:   no blurry vision, no floaters, no recent vision changes, + wears glasses or contacts  ENT:   + hearing loss, no loose or painful teeth, no dentures, last saw dentist 1.5 years ago  Hematologic:  no easy bruising, no abnormal bleeding, no  clotting disorder, no frequent epistaxis  Endocrine:  no diabetes, does not check CBG's at home     Physical Exam:   BP 109/76 (BP Location: Left Arm, Patient Position: Sitting, Cuff Size: Normal)   Pulse 86   Temp 97.9 F (36.6 C)   Resp 16   Ht 5\' 8"  (1.727 m)   Wt 157 lb (71.2 kg)   SpO2 99% Comment: RA  BMI 23.87 kg/m   General:  Elderly male NAD  we  HEENT:  Unremarkable   Neck:   no JVD, no bruits, no adenopathy   Chest:   clear to auscultation, symmetrical breath sounds, no wheezes, no rhonchi   CV:   RRR, grade III/VI systolic murmur   Abdomen:  soft, non-tender, no masses   Extremities:  warm, well-perfused, pulses diminished, mild LE edema  Rectal/GU  Deferred  Neuro:   Grossly non-focal and symmetrical throughout  Skin:   Clean and dry, no rashes, no breakdown   Diagnostic Tests:  ECHOCARDIOGRAM REPORT       Patient Name:  [name] Date of Exam: 01/13/2019  Medical Rec #: [id number]   Height:    68.0 in  Accession #:  [id number]   Weight:    175.6 lb  Date of Birth: [birth year]   BSA:     1.93 m  Patient Age:  49 years    BP:      118/88 mmHg  Patient Gender: M       HR:      80 bpm.  Exam Location: [address]   Procedure: 2D Echo, Cardiac Doppler and Color Doppler   Indications:  R01.1    History:    Patient has prior history of Echocardiogram examinations,  most         recent 12/25/2017. CHF, CAD, Prior CABG, MV repair;         Arrythmias:Atrial Fibrillation Signs/Symptoms:Murmur Risk  Factors:Hypertension and Dyslipidemia. Mitral Valve: A  repair         valve is present in the mitral position.    Sonographer:  Coralyn Helling RDCS  Referring Phys: [id number] [place]    1. Left ventricular ejection fraction, by visual estimation, is 20 to  25%. The left ventricle has severely decreased function. Moderate to    severely increased left ventricular size. There is no left ventricular  hypertrophy.  2. Left ventricular diastolic function could not be evaluated pattern of  LV diastolic filling.  3. Global right ventricle has normal systolic function.The right  ventricular size is mildly enlarged. No increase in right ventricular wall  thickness.  4. Left atrial size was moderately dilated.  5. Right atrial size was moderately dilated.  6. Moderate calcification of the mitral valve leaflet(s).  7. Moderate thickening of the mitral valve leaflet(s).  8. Severely decreased mobility of the mitral valve leaflets.  9. The mitral valve has been repaired/replaced. Moderate to severe mitral  valve regurgitation.  10. The tricuspid valve is normal in structure. Tricuspid valve  regurgitation is mild.  11. The aortic valve is abnormal Aortic valve regurgitation is mild by  color flow Doppler.  12. There is Severe calcifcation of the aortic valve.  13. There is Severely thickening of the aortic valve.  14. Suspect low flow-low gradient AS given visual limited movement of  aortic valve and severely reduced LV EF.  15. The pulmonic valve was grossly normal. Pulmonic valve regurgitation is  mild to moderate by color flow Doppler.  16. Moderately elevated pulmonary artery systolic pressure.  17. The inferior vena cava is normal in size with <50% respiratory  variability, suggesting right atrial pressure of 8 mmHg.  18. While heart rate much improved on this study, side by side comparison  shows dilation of the LV and stable severely reduced LV EF. The aortic  valve is severely thickened/calcified and visually appears to minimally  open. Suspect low flow low gradient  severe AS, as dimensionless index 0.18. Prior MV repair with immobile  posterior mitral valve leaflet.   FINDINGS  Left Ventricle: Left ventricular ejection fraction, by visual estimation,  is 20 to 25%. The left ventricle has  severely decreased function. There is  no left ventricular hypertrophy. Moderate to severely increased left  ventricular size. Spectral Doppler  shows Left ventricular diastolic function could not be evaluated pattern  of LV diastolic filling.   Right Ventricle: The right ventricular size is mildly enlarged. No  increase in right ventricular wall thickness. Global RV systolic function  is has normal systolic function. The tricuspid regurgitant velocity is  3.04 m/s, and with an assumed right atrial  pressure of 8 mmHg, the estimated right ventricular systolic pressure is  moderately elevated at 44.9 mmHg.   Left Atrium: Left atrial size was moderately dilated.   Right Atrium: Right atrial size was moderately dilated   Pericardium: There is no evidence of pericardial effusion.   Mitral Valve: The mitral valve has been repaired/replaced. There is  moderate thickening of the mitral valve leaflet(s). There is moderate  calcification of the mitral valve leaflet(s). Severely decreased mobility  of the mitral valve leaflets. Moderate to  severe mitral valve regurgitation. Fixed posterior MV leaflet.   Tricuspid Valve: The tricuspid valve is normal in structure. Tricuspid  valve regurgitation is mild by color flow Doppler.   Aortic Valve: The aortic valve is abnormal. There is Severely thickening  and Severe  calcifcation of the aortic valve. Aortic valve regurgitation is  mild by color flow Doppler. There is Severely thickening of the aortic  valve. Severe calcifcation. Aortic  valve mean gradient measures 12.2 mmHg. Aortic valve peak gradient  measures 21.7 mmHg. Aortic valve area, by VTI measures 0.83 cm. Suspect  low flow-low gradient AS given visual limited movement of aortic valve and  severely reduced LV EF.   Pulmonic Valve: The pulmonic valve was grossly normal. Pulmonic valve  regurgitation is mild to moderate by color flow Doppler.   Aorta: The aortic root and ascending  aorta are structurally normal, with  no evidence of dilitation.   Venous: The inferior vena cava is normal in size with less than 50%  respiratory variability, suggesting right atrial pressure of 8 mmHg.   IAS/Shunts: No atrial level shunt detected by color flow Doppler.      LEFT VENTRICLE  PLAX 2D  LVIDd:     6.10 cm  LVIDs:     5.20 cm  LV PW:     1.10 cm  LV IVS:    0.90 cm  LVOT diam:   2.40 cm  LV SV:     57 ml  LV SV Index:  29.16  LVOT Area:   4.52 cm     RIGHT VENTRICLE      IVC  RVSP:      39.9 mmHg IVC diam: 2.00 cm   LEFT ATRIUM       Index    RIGHT ATRIUM      Index  LA diam:    4.50 cm 2.33 cm/m RA Pressure: 3.00 mmHg  LA Vol (A2C):  85.3 ml 44.11 ml/m RA Area:   21.20 cm  LA Vol (A4C):  91.5 ml 47.32 ml/m RA Volume:  71.60 ml 37.03 ml/m  LA Biplane Vol: 96.1 ml 49.69 ml/m  AORTIC VALVE  AV Area (Vmax):  0.77 cm  AV Area (Vmean):  0.78 cm  AV Area (VTI):   0.83 cm  AV Vmax:      232.80 cm/s  AV Vmean:     161.000 cm/s  AV VTI:      0.419 m  AV Peak Grad:   21.7 mmHg  AV Mean Grad:   12.2 mmHg  LVOT Vmax:     39.66 cm/s  LVOT Vmean:    27.780 cm/s  LVOT VTI:     0.077 m  LVOT/AV VTI ratio: 0.18    AORTA  Ao Root diam: 3.10 cm  Ao Asc diam: 3.20 cm   MITRAL VALVE            TRICUSPID VALVE  MV Area (PHT):           TR Peak grad:  36.9 mmHg                   TR Vmax:    329.00 cm/s  MV Decel Time: 190 msec       Estimated RAP: 3.00 mmHg  MV E velocity: 168.20 cm/s 103 cm/s RVSP:      39.9 mmHg                     SHUNTS                   Systemic VTI: 0.08 m                   Systemic Diam: 2.40 cm     [place]  Harrell Gave MD  Electronically signed by Buford Dresser MD  Signature Date/Time:  01/13/2019/1:19:49 PM       RIGHT/LEFT HEART CATH AND CORONARY/GRAFT ANGIOGRAPHY  Conclusion  1.  Nonobstructive coronary disease with mild diffuse stenosis of the LAD, left circumflex, and RCA 2.  Total occlusion of the first OM branch of the circumflex with continued patency of the saphenous vein graft to OM 3.  Severe aortic stenosis with mean gradient 33 mmHg and calculated valve area 0.8 cm 4.  Large V waves consistent with severe mitral regurgitation  Recommendation: Continue multidisciplinary evaluation for this patient with severe low-flow low gradient aortic stenosis, probable severe mitral regurgitation with previous mitral repair, and progressive heart failure symptoms  Indications  Severe aortic stenosis [I35.0 (ICD-10-CM)]  Procedural Details  Technical Details INDICATION: 86 year old gentleman with progressive heart failure and worsening LV function.  He has developed severe calcific aortic stenosis felt to be low flow low gradient aortic stenosis.  He has a history of single-vessel CABG and mitral valve repair.  He is also developed moderately severe mitral regurgitation.  He presents for hemodynamic assessment and assessment of progressive ischemic disease as a potential etiology of his worsening heart failure.  PROCEDURAL DETAILS: There was an indwelling IV in a right antecubital vein. Using normal sterile technique, the IV was changed out for a 5 Fr brachial sheath over a 0.018 inch wire. The left wrist was then prepped, draped, and anesthetized with 1% lidocaine. Using the modified Seldinger technique a 5/6 French Slender sheath was placed in the left radial artery. Intra-arterial verapamil was administered through the radial artery sheath. IV heparin was administered after a JR4 catheter was advanced into the central aorta. A Swan-Ganz catheter was used for the right heart catheterization. Standard protocol was followed for recording of right heart pressures and sampling  of oxygen saturations. Fick cardiac output was calculated. Standard Judkins catheters were used for selective coronary angiography and saphenous vein graft angiography.  The aortic valve was crossed with an AL-1 catheter and straight tip wire.  LV pressure is recorded and an aortic valve pullback is performed. There were no immediate procedural complications. The patient was transferred to the post catheterization recovery area for further monitoring.    Estimated blood loss <50 mL.   During this procedure medications were administered to achieve and maintain moderate conscious sedation while the patient's heart rate, blood pressure, and oxygen saturation were continuously monitored and I was present face-to-face 100% of this time.  Medications (Filter: Administrations occurring from 04/28/19 1010 to 04/28/19 1130) (important)  Continuous medications are totaled by the amount administered until 04/28/19 1130.  fentaNYL (SUBLIMAZE) injection (mcg) Total dose:  25 mcg Date/Time  Rate/Dose/Volume Action  04/28/19 1038  25 mcg Given    midazolam (VERSED) injection (mg) Total dose:  1 mg Date/Time  Rate/Dose/Volume Action  04/28/19 1038  1 mg Given    Heparin (Porcine) in NaCl 1000-0.9 UT/500ML-% SOLN (mL) Total volume:  1,000 mL Date/Time  Rate/Dose/Volume Action  04/28/19 1038  500 mL Given  1038  500 mL Given    lidocaine (PF) (XYLOCAINE) 1 % injection (mL) Total volume:  5 mL Date/Time  Rate/Dose/Volume Action  04/28/19 1039  5 mL Given    Radial Cocktail/Verapamil only (mL) Total volume:  10 mL Date/Time  Rate/Dose/Volume Action  04/28/19 1039  10 mL Given    heparin injection (Units) Total dose:  3,500 Units Date/Time  Rate/Dose/Volume Action  04/28/19 1058  3,500 Units Given  iohexol (OMNIPAQUE) 350 MG/ML injection (mL) Total volume:  40 mL Date/Time  Rate/Dose/Volume Action  04/28/19 1118  40 mL Given    Sedation Time  Sedation Time Physician-1: 35 minutes 23  seconds  Contrast  Medication Name Total Dose  iohexol (OMNIPAQUE) 350 MG/ML injection 40 mL    Radiation/Fluoro  Fluoro time: 6.3 (min) DAP: 23.8 (Gycm2) Cumulative Air Kerma: 289 (mGy)  Coronary Findings  Diagnostic Dominance: Right Left Anterior Descending  There is mild diffuse disease throughout the vessel. The LAD is patent throughout. There is mild nonobstructive disease present. The vessel reaches the LV apex. The diagonal branches are small in caliber with mild diffuse disease.  Left Circumflex  There is mild diffuse disease throughout the vessel. The circumflex is a large vessel. There is a large second OM branch with mild diffuse nonobstructive disease noted. The first OM is occluded and fills from the graft.  First Obtuse Marginal Branch  1st Mrg lesion 100% stenosed  1st Mrg lesion is 100% stenosed.  Right Coronary Artery  There is mild diffuse disease throughout the vessel. Large, dominant vessel. There is diffuse nonobstructive stenosis less than 50% present. PDA and PLA branches are supplied by the RCA.  Mid RCA lesion 50% stenosed  Mid RCA lesion is 50% stenosed.  Saphenous Graft To 1st Mrg  SVG. The saphenous vein graft to first OM is patent without stenosis.  Intervention  No interventions have been documented. Right Heart  Right Heart Pressures Hemodynamic findings consistent with mitral valve regurgitation.  Left Heart  Aortic Valve There is severe aortic valve stenosis. The aortic valve is calcified. There is restricted aortic valve motion. Mean gradient 33 mmHg, calculated aortic valve area 0.8 cm  Coronary Diagrams  Diagnostic Dominance: Right  Intervention  Implants   No implant documentation for this case.  Syngo Images  Show images for CARDIAC CATHETERIZATION  Images on Long Term Storage  Show images for [place], [place] to Procedure Log  Procedure Log    Hemo Data   Most Recent Value  Fick Cardiac Output 3.99 L/min   Fick Cardiac Output Index 2.18 (L/min)/BSA  Aortic Mean Gradient 32.47 mmHg  Aortic Peak Gradient 32 mmHg  Aortic Valve Area 0.83  Aortic Value Area Index 0.45 cm2/BSA  RA A Wave 10 mmHg  RA V Wave 13 mmHg  RA Mean 11 mmHg  RV Systolic Pressure 54 mmHg  RV Diastolic Pressure 6 mmHg  RV EDP 11 mmHg  PA Systolic Pressure 54 mmHg  PA Diastolic Pressure 17 mmHg  PA Mean 32 mmHg  PW A Wave 16 mmHg  PW V Wave 32 mmHg  PW Mean 19 mmHg  AO Systolic Pressure 99 mmHg  AO Diastolic Pressure 47 mmHg  AO Mean 66 mmHg  LV Systolic Pressure 454 mmHg  LV Diastolic Pressure 7 mmHg  LV EDP 13 mmHg  AOp Systolic Pressure 098 mmHg  AOp Diastolic Pressure 43 mmHg  AOp Mean Pressure 65 mmHg  LVp Systolic Pressure 119 mmHg  LVp Diastolic Pressure 9 mmHg  LVp EDP Pressure 16 mmHg  QP/QS 1  TPVR Index 14.69 HRUI  TSVR Index 30.29 HRUI  PVR SVR Ratio 0.24  TPVR/TSVR Ratio 0.48    Cardiac TAVR CT  TECHNIQUE: The patient was scanned on a [employer]. A 120 kV retrospective scan was triggered in the descending thoracic aorta at 111 HU's. Gantry rotation speed was 250 msecs and collimation was .6 mm. No beta blockade or nitro  were given. The 3D data set was reconstructed in 5% intervals of the R-R cycle. Systolic and diastolic phases were analyzed on a dedicated work station using MPR, MIP and VRT modes. The patient received 80 cc of contrast.  FINDINGS: Aortic Valve: Trileaflet aortic valve with moderately calcified leaflets, severely reduced leaflets opening and no calcifications extending into the LVOT.  Aorta: Normal size with mild diffuse calcifications and no dissection.  Sinotubular Junction: 30 x 28 mm  Ascending Thoracic Aorta: 31 x 31 mm  Aortic Arch: 28 x 27 mm  Descending Thoracic Aorta: 21 x 20 mm  Sinus of Valsalva Measurements:  Non-coronary: 37 mm  Right -coronary: 33 mm  Left -coronary: 34 mm  Virtual Basal Annulus  Measurements:  Maximum/Minimum Diameter: 30.0 x 36.5 mm  Mean Diameter: 27.3 mm  Perimeter: 88.6 mm  Area: 586 mm2  Coronary Artery Height above Annulus:  Left Main: 17 mm  Right Coronary: 22 mm  Optimum Fluoroscopic Angle for Delivery: RAO 2 CRA 2  IMPRESSION: 1. Trileaflet aortic valve with moderately calcified leaflets, severely reduced leaflets opening and no calcifications extending into the LVOT. Aortic valve calcium score 2655 consistent with severe aortic stenosis. Annular measurements suitable for delivery of a 29 mm Edwrads-SAPIEN 3 Ultra valve.  2. Sufficient coronary to annulus distance.  3. Optimum Fluoroscopic Angle for Delivery: RAO 2 CRA 2.  4. No thrombus in the left atrial appendage.  5. Dilated pulmonary artery measuring 34 mm suggestive of pulmonary hypertension.   Electronically Signed   By: Ena Dawley   On: 05/16/2019 00:32       CT ANGIOGRAPHY CHEST, ABDOMEN AND PELVIS  TECHNIQUE: Multidetector CT imaging through the chest, abdomen and pelvis was performed using the standard protocol during bolus administration of intravenous contrast. Multiplanar reconstructed images and MIPs were obtained and reviewed to evaluate the vascular anatomy.  CONTRAST:  58mL OMNIPAQUE IOHEXOL 350 MG/ML SOLN  COMPARISON:  Chest CT 12/09/2018.  FINDINGS: CTA CHEST FINDINGS  Cardiovascular: Heart size is mildly enlarged. There is no significant pericardial fluid, thickening or pericardial calcification. There is aortic atherosclerosis, as well as atherosclerosis of the great vessels of the mediastinum and the coronary arteries, including calcified atherosclerotic plaque in the left main, left anterior descending, left circumflex and right coronary arteries. Status post median sternotomy for CABG. Severe thickening calcification of the aortic valve.  Mediastinum/Lymph Nodes: No pathologically enlarged mediastinal or hilar  lymph nodes. Esophagus is unremarkable in appearance. No axillary lymphadenopathy.  Lungs/Pleura: Slight enlargement of a small right upper lobe pulmonary nodule (axial image 40 of series 16) which currently measures 9 x 5 mm (mean diameter of 7 mm), previously only a mean diameter of 4 mm. No other larger more suspicious appearing pulmonary nodules or masses are noted. Moderate bilateral pleural effusions lying dependently. This is associated with minimal subsegmental atelectasis in the lung bases bilaterally. No acute consolidative airspace disease.  Musculoskeletal/Soft Tissues: Median sternotomy wires. There are no aggressive appearing lytic or blastic lesions noted in the visualized portions of the skeleton.  CTA ABDOMEN AND PELVIS FINDINGS  Hepatobiliary: Subcentimeter low-attenuation lesion in segment 5 of the liver, too small to characterize, but statistically likely to represent a tiny cyst. Liver has a shrunken appearance and nodular contour, indicative of underlying cirrhosis. No definite suspicious cystic or solid hepatic lesions. No intra or extrahepatic biliary ductal dilatation. Gallbladder is normal in appearance.  Pancreas: No pancreatic mass. No pancreatic ductal dilatation. No pancreatic or peripancreatic fluid collections or inflammatory  changes.  Spleen: Unremarkable.  Adrenals/Urinary Tract: Bilateral kidneys and adrenal glands are normal in appearance. No hydroureteronephrosis. Urinary bladder is normal in appearance.  Stomach/Bowel: Normal appearance of the stomach. No pathologic dilatation of small bowel or colon. Normal appendix.  Vascular/Lymphatic: Aortic atherosclerosis, with vascular findings and measurements pertinent to potential TAVR procedure, as detailed below. No aneurysm or dissection noted in the abdominal or pelvic vasculature. No lymphadenopathy noted in the abdomen or pelvis.  Reproductive: Prostate gland and seminal  vesicles are unremarkable in appearance.  Other: Trace volume of ascites.  No pneumoperitoneum.  Musculoskeletal: There are no aggressive appearing lytic or blastic lesions noted in the visualized portions of the skeleton.  VASCULAR MEASUREMENTS PERTINENT TO TAVR:  AORTA:  Minimal Aortic Diameter-14 x 10 mm  Severity of Aortic Calcification-moderate  RIGHT PELVIS:  Right Common Iliac Artery -  Minimal Diameter-10 x 8.6 mm  Tortuosity-mild  Calcification-moderate  Right External Iliac Artery -  Minimal Diameter-8.3 x 7.6 mm  Tortuosity-moderate to severe  Calcification-mild  Right Common Femoral Artery -  Minimal Diameter-8.2 x 8.6 mm  Tortuosity-mild  Calcification-mild  LEFT PELVIS:  Left Common Iliac Artery -  Minimal Diameter-8.0 x 8.1 mm  Tortuosity-mild-to-moderate  Calcification-moderate  Left External Iliac Artery -  Minimal Diameter-9.0 x 8.5 mm  Tortuosity-moderate  Calcification-none  Left Common Femoral Artery -  Minimal Diameter-8.0 x 8.2 mm  Tortuosity-mild  Calcification-mild  Review of the MIP images confirms the above findings.  IMPRESSION: 1. Vascular findings and measurements pertinent to potential TAVR procedure, as detailed above. 2. Severe thickening calcification of the aortic valve, compatible with reported clinical history of severe aortic stenosis. 3. Enlarging right upper lobe pulmonary nodule measuring 9 x 5 mm (mean diameter of 7 mm), which previously had a mean diameter of only 4 mm on 12/09/2018. This nodule is concerning, but is currently likely below the level of PET-CT resolution. Close attention on repeat noncontrast chest CT is recommended in 3-6 months to reassess this lesion. 4. Moderate bilateral pleural effusions lying dependently with mild dependent subsegmental atelectasis in the lower lobes of the lungs bilaterally. 5. Morphologic changes in the liver  indicative of underlying cirrhosis. Trace volume of ascites. 6. Additional incidental findings, as above.   Electronically Signed   By: Vinnie Langton M.D.   On: 05/13/2019 11:58      STS SCORE Procedure: Isolated AVR   Risk of Mortality: 5.843%  Renal Failure: 9.558%  Permanent Stroke: 1.887%  Prolonged Ventilation: 24.673%  DSW Infection: 0.208%  Reoperation: 8.225%  Morbidity or Mortality: 39.633%  Short Length of Stay: 6.701%  Long Length of Stay: 29.172%     Impression:  Patient has severe global left ventricular systolic dysfunction with dilated nonischemic cardiomyopathy.  He has suffered from chronic combined systolic and diastolic congestive heart failure with longstanding persistent atrial fibrillation for the last several years, although he apparently has been doing better recently on medical therapy as guided by Dr. Harrell Gave.  He currently describes stable symptoms of exertional shortness of breath and fatigue consistent with chronic combined systolic and diastolic congestive heart failure, NYHA functional class II.  I have personally reviewed the patient's most recent transthoracic echocardiogram performed last October, diagnostic cardiac catheterization performed last month, and recent CT angiograms.  Echocardiogram reveals profound left ventricular systolic dysfunction with ejection fraction estimated only 20 to 25% in the setting of probably severe mitral regurgitation.  The left ventricle is dilated.  Aortic valve is trileaflet and calcified.  There is decreased  leaflet mobility.  Mean transvalvular gradient across aortic valve was measured only 2.3 m/s corresponding to mean transvalvular gradient estimated only 12 mmHg but aortic valve area calculated only 0.83 cm and the DVI was notably quite low at 0.18.  This could be consistent with severe low-flow low gradient aortic stenosis.  Echocardiogram also reveals severely restricted mitral valve leaflet mobility  with at least moderate and probably severe mitral regurgitation.   There is type IIIa dysfunction with an eccentric jet of regurgitation directed posteriorly.  The right ventricle appears mildly dilated.  There was mild tricuspid regurgitation.  Recent diagnostic cardiac catheterization confirmed the presence of significant aortic stenosis as well as pulmonary hypertension and large V waves on pulmonary capillary wedge tracing.  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  Cardiac-gated CTA of the heart reveals anatomical characteristics consistent with aortic stenosis suitable for treatment by transcatheter aortic valve replacement without any significant complicating features and CTA of the aorta and iliac vessels demonstrate what appears to be adequate pelvic vascular access to facilitate a transfemoral approach.    Plan:  I attempted to discuss the results of the patient's previous diagnostic tests at length with the patient and his wife in the office today.  Both were surprised to find out that the patient's aortic valve has not already been fixed.  The patient does admit to continued symptoms of exertional shortness of breath and fatigue.  We discussed the possibility that the patient might benefit from transcatheter aortic valve replacement but that I remain very concerned about the severity of the patient's global left ventricular systolic dysfunction and mitral regurgitation.  Both the patient and his wife understand that he would not be considered a candidate for conventional surgery under any circumstances.  At this point I favor proceeding with transesophageal echocardiogram to further characterize the severity of aortic stenosis and mitral regurgitation in an effort to determine whether or not the patient might gain significant benefit from  transcatheter aortic valve replacement.  The patient's case will be further reviewed by a multidisciplinary team of specialists before final recommendations are made.  All questions answered.   I spent in excess of 90 minutes during the conduct of this office consultation and >50% of this time involved direct face-to-face encounter with the patient for counseling and/or coordination of their care.    Valentina Gu. Roxy Manns, MD 05/18/2019 3:38 PM

## 2019-05-19 ENCOUNTER — Encounter: Payer: Self-pay | Admitting: Cardiothoracic Surgery

## 2019-05-19 ENCOUNTER — Other Ambulatory Visit (HOSPITAL_COMMUNITY)
Admission: RE | Admit: 2019-05-19 | Discharge: 2019-05-19 | Disposition: A | Discharge status: Home / Self Care | Payer: PPO | Source: Ambulatory Visit | Attending: Cardiovascular Disease | Admitting: Cardiovascular Disease

## 2019-05-19 ENCOUNTER — Other Ambulatory Visit: Payer: Self-pay | Admitting: Physician Assistant

## 2019-05-19 DIAGNOSIS — I350 Nonrheumatic aortic (valve) stenosis: Secondary | ICD-10-CM

## 2019-05-19 DIAGNOSIS — Z01812 Encounter for preprocedural laboratory examination: Secondary | ICD-10-CM | POA: Diagnosis not present

## 2019-05-19 DIAGNOSIS — Z20822 Contact with and (suspected) exposure to covid-19: Secondary | ICD-10-CM | POA: Diagnosis not present

## 2019-05-19 DIAGNOSIS — I340 Nonrheumatic mitral (valve) insufficiency: Secondary | ICD-10-CM

## 2019-05-19 LAB — SARS CORONAVIRUS 2 (TAT 6-24 HRS): SARS Coronavirus 2: NEGATIVE

## 2019-05-20 ENCOUNTER — Other Ambulatory Visit: Payer: Self-pay

## 2019-05-20 ENCOUNTER — Ambulatory Visit (HOSPITAL_COMMUNITY): Payer: PPO | Admitting: Certified Registered Nurse Anesthetist

## 2019-05-20 ENCOUNTER — Encounter (HOSPITAL_COMMUNITY)
Admission: RE | Disposition: A | Discharge status: Home / Self Care | Payer: Self-pay | Source: Home / Self Care | Attending: Cardiology

## 2019-05-20 ENCOUNTER — Ambulatory Visit (HOSPITAL_BASED_OUTPATIENT_CLINIC_OR_DEPARTMENT_OTHER): Payer: PPO

## 2019-05-20 ENCOUNTER — Encounter (HOSPITAL_COMMUNITY): Payer: Self-pay | Admitting: Cardiology

## 2019-05-20 ENCOUNTER — Ambulatory Visit (HOSPITAL_COMMUNITY)
Admission: RE | Admit: 2019-05-20 | Discharge: 2019-05-20 | Disposition: A | Discharge status: Home / Self Care | Payer: PPO | Attending: Cardiology | Admitting: Cardiology

## 2019-05-20 DIAGNOSIS — I4821 Permanent atrial fibrillation: Secondary | ICD-10-CM | POA: Insufficient documentation

## 2019-05-20 DIAGNOSIS — N183 Chronic kidney disease, stage 3 unspecified: Secondary | ICD-10-CM | POA: Insufficient documentation

## 2019-05-20 DIAGNOSIS — I340 Nonrheumatic mitral (valve) insufficiency: Secondary | ICD-10-CM

## 2019-05-20 DIAGNOSIS — I2510 Atherosclerotic heart disease of native coronary artery without angina pectoris: Secondary | ICD-10-CM | POA: Diagnosis not present

## 2019-05-20 DIAGNOSIS — I420 Dilated cardiomyopathy: Secondary | ICD-10-CM | POA: Diagnosis not present

## 2019-05-20 DIAGNOSIS — E785 Hyperlipidemia, unspecified: Secondary | ICD-10-CM | POA: Diagnosis not present

## 2019-05-20 DIAGNOSIS — I130 Hypertensive heart and chronic kidney disease with heart failure and stage 1 through stage 4 chronic kidney disease, or unspecified chronic kidney disease: Secondary | ICD-10-CM | POA: Diagnosis not present

## 2019-05-20 DIAGNOSIS — I350 Nonrheumatic aortic (valve) stenosis: Secondary | ICD-10-CM

## 2019-05-20 DIAGNOSIS — Z8673 Personal history of transient ischemic attack (TIA), and cerebral infarction without residual deficits: Secondary | ICD-10-CM | POA: Insufficient documentation

## 2019-05-20 DIAGNOSIS — Z87891 Personal history of nicotine dependence: Secondary | ICD-10-CM | POA: Insufficient documentation

## 2019-05-20 DIAGNOSIS — Z79899 Other long term (current) drug therapy: Secondary | ICD-10-CM | POA: Insufficient documentation

## 2019-05-20 DIAGNOSIS — I6523 Occlusion and stenosis of bilateral carotid arteries: Secondary | ICD-10-CM | POA: Diagnosis not present

## 2019-05-20 DIAGNOSIS — Z7901 Long term (current) use of anticoagulants: Secondary | ICD-10-CM | POA: Diagnosis not present

## 2019-05-20 DIAGNOSIS — I080 Rheumatic disorders of both mitral and aortic valves: Secondary | ICD-10-CM | POA: Diagnosis not present

## 2019-05-20 DIAGNOSIS — Z951 Presence of aortocoronary bypass graft: Secondary | ICD-10-CM | POA: Diagnosis not present

## 2019-05-20 DIAGNOSIS — I5042 Chronic combined systolic (congestive) and diastolic (congestive) heart failure: Secondary | ICD-10-CM | POA: Diagnosis not present

## 2019-05-20 DIAGNOSIS — I083 Combined rheumatic disorders of mitral, aortic and tricuspid valves: Secondary | ICD-10-CM | POA: Diagnosis not present

## 2019-05-20 DIAGNOSIS — Z79890 Hormone replacement therapy (postmenopausal): Secondary | ICD-10-CM | POA: Insufficient documentation

## 2019-05-20 HISTORY — PX: TEE WITHOUT CARDIOVERSION: SHX5443

## 2019-05-20 SURGERY — ECHOCARDIOGRAM, TRANSESOPHAGEAL
Anesthesia: General

## 2019-05-20 MED ORDER — PROPOFOL 500 MG/50ML IV EMUL
INTRAVENOUS | Status: DC | PRN
  Administered 2019-05-20: 50 ug/kg/min via INTRAVENOUS

## 2019-05-20 MED ORDER — PHENYLEPHRINE HCL-NACL 10-0.9 MG/250ML-% IV SOLN
INTRAVENOUS | Status: DC | PRN
  Administered 2019-05-20: 25 ug/min via INTRAVENOUS

## 2019-05-20 MED ORDER — SODIUM CHLORIDE 0.9 % IV SOLN
INTRAVENOUS | Status: DC
  Administered 2019-05-20: 500 mL via INTRAVENOUS

## 2019-05-20 MED ORDER — PROPOFOL 10 MG/ML IV BOLUS
INTRAVENOUS | Status: DC | PRN
  Administered 2019-05-20: 20 mg via INTRAVENOUS
  Administered 2019-05-20: 10 mg via INTRAVENOUS

## 2019-05-20 MED ORDER — LIDOCAINE 2% (20 MG/ML) 5 ML SYRINGE
INTRAMUSCULAR | Status: DC | PRN
  Administered 2019-05-20: 50 mg via INTRAVENOUS

## 2019-05-20 NOTE — Anesthesia Procedure Notes (Signed)
Procedure Name: MAC Date/Time: 05/20/2019 2:02 PM Performed by: Colin Benton, CRNA Pre-anesthesia Checklist: Patient identified, Emergency Drugs available, Suction available and Patient being monitored Patient Re-evaluated:Patient Re-evaluated prior to induction Oxygen Delivery Method: Nasal cannula Induction Type: IV induction Placement Confirmation: positive ETCO2 Dental Injury: Teeth and Oropharynx as per pre-operative assessment

## 2019-05-20 NOTE — Interval H&P Note (Signed)
History and Physical Interval Note:  05/20/2019 1:49 PM  [name]  has presented today for surgery, with the diagnosis of AORTIC STENOSIS AND REGURGITATION.  The various methods of treatment have been discussed with the patient and family. After consideration of risks, benefits and other options for treatment, the patient has consented to  Procedure(s): TRANSESOPHAGEAL ECHOCARDIOGRAM (TEE) (N/A) as a surgical intervention.  The patient's history has been reviewed, patient examined, no change in status, stable for surgery.  I have reviewed the patient's chart and labs.  Questions were answered to the patient's satisfaction.     Donato Heinz

## 2019-05-20 NOTE — Progress Notes (Signed)
  Echocardiogram Echocardiogram Transesophageal has been performed.  [name] A Yahayra Geis 05/20/2019, 3:05 PM

## 2019-05-20 NOTE — Anesthesia Postprocedure Evaluation (Signed)
Anesthesia Post Note  Patient: [name]  Procedure(s) Performed: TRANSESOPHAGEAL ECHOCARDIOGRAM (TEE) (N/A )     Patient location during evaluation: Endoscopy Anesthesia Type: General Level of consciousness: awake Pain management: pain level controlled Vital Signs Assessment: post-procedure vital signs reviewed and stable Respiratory status: spontaneous breathing Cardiovascular status: stable Postop Assessment: no apparent nausea or vomiting Anesthetic complications: no    Last Vitals:  Vitals:   05/20/19 1439 05/20/19 1450  BP: (!) 145/91 (!) 105/45  Pulse: 72 71  Resp: (!) 22 18  Temp: (!) 36.3 C   SpO2: 98% 100%    Last Pain:  Vitals:   05/20/19 1439  TempSrc: Temporal  PainSc: 0-No pain                 Willson Lipa

## 2019-05-20 NOTE — Transfer of Care (Signed)
Immediate Anesthesia Transfer of Care Note  Patient: [name]  Procedure(s) Performed: TRANSESOPHAGEAL ECHOCARDIOGRAM (TEE) (N/A )  Patient Location: Endoscopy Unit  Anesthesia Type:MAC  Level of Consciousness: drowsy  Airway & Oxygen Therapy: Patient Spontanous Breathing and Patient connected to nasal cannula oxygen  Post-op Assessment: Report given to RN and Post -op Vital signs reviewed and stable  Post vital signs: Reviewed and stable  Last Vitals:  Vitals Value Taken Time  BP 145/91 05/20/19 1439  Temp    Pulse 79 05/20/19 1440  Resp 20 05/20/19 1440  SpO2 96 % 05/20/19 1440  Vitals shown include unvalidated device data.  Last Pain:  Vitals:   05/20/19 1304  TempSrc: Oral  PainSc: 0-No pain         Complications: No apparent anesthesia complications

## 2019-05-20 NOTE — Progress Notes (Signed)
  Echocardiogram Echocardiogram Transesophageal has been performed.  Zarin Hagmann A Sylvain Hasten 05/20/2019, 3:06 PM

## 2019-05-20 NOTE — CV Procedure (Signed)
     TRANSESOPHAGEAL ECHOCARDIOGRAM   NAME:  [name]   MRN: [id number] DOB:  [birth year]   ADMIT DATE: 05/20/2019  INDICATIONS: Evaluate MR, AS  PROCEDURE:   Informed consent was obtained prior to the procedure. The risks, benefits and alternatives for the procedure were discussed and the patient comprehended these risks.  Risks include, but are not limited to, cough, sore throat, vomiting, nausea, somnolence, esophageal and stomach trauma or perforation, bleeding, low blood pressure, aspiration, pneumonia, infection, trauma to the teeth and death.    After a procedural time-out, the oropharynx was anesthetized and the patient was sedated by the anesthesia service. The transesophageal probe was inserted in the esophagus and stomach without difficulty and multiple views were obtained. Anesthesia was monitored by Dr.Green and Everlean Cherry, CRNA.   COMPLICATIONS:    There were no immediate complications.  FINDINGS:  LEFT VENTRICLE: EF = 20-25%.  Global hypokinesis.  RIGHT VENTRICLE: Moderate systolic dysfunction.  LEFT ATRIUM: No thrombus/mass.  RIGHT ATRIUM: No thrombus/mass.  AORTIC VALVE:  Severely calcified. No regurgitation.  Low flow, low gradient severe AS.  MITRAL VALVE:    S/p repair.  Mild-moderate regurgitation.  TRICUSPID VALVE: Normal structure. Mild regurgitation.   PULMONIC VALVE: Grossly normal structure. Mild regurgitation.   INTERATRIAL SEPTUM: No PFO or ASD seen by color Doppler.  PERICARDIUM: No effusion noted.  DESCENDING AORTA: Normal size   CONCLUSION: Mild-moderate mitral regurgitation.  Severe aortic stenosis.  Oswaldo Milian MD [facility]  [address] [place], [place] [address] [phone]   5:00 PM

## 2019-05-20 NOTE — Anesthesia Preprocedure Evaluation (Addendum)
Anesthesia Evaluation  Patient identified by MRN, date of birth, ID band Patient awake    Airway Mallampati: II  TM Distance: >3 FB     Dental   Pulmonary former smoker,    breath sounds clear to auscultation       Cardiovascular hypertension, + CAD and +CHF   Rhythm:Irregular     Neuro/Psych TIA   GI/Hepatic negative GI ROS, Neg liver ROS,   Endo/Other    Renal/GU Renal disease     Musculoskeletal   Abdominal   Peds  Hematology   Anesthesia Other Findings   Reproductive/Obstetrics                             Anesthesia Physical Anesthesia Plan  ASA: III  Anesthesia Plan: General   Post-op Pain Management:    Induction: Intravenous  PONV Risk Score and Plan: 2 and Propofol infusion  Airway Management Planned: Nasal Cannula and Simple Face Mask  Additional Equipment:   Intra-op Plan:   Post-operative Plan:   Informed Consent:   Plan Discussed with: Anesthesiologist and CRNA  Anesthesia Plan Comments:        Anesthesia Quick Evaluation

## 2019-05-21 ENCOUNTER — Other Ambulatory Visit: Payer: Self-pay

## 2019-05-21 ENCOUNTER — Encounter: Payer: Self-pay | Admitting: Thoracic Surgery (Cardiothoracic Vascular Surgery)

## 2019-05-21 ENCOUNTER — Telehealth: Payer: Self-pay | Admitting: Thoracic Surgery (Cardiothoracic Vascular Surgery)

## 2019-05-21 DIAGNOSIS — I350 Nonrheumatic aortic (valve) stenosis: Secondary | ICD-10-CM

## 2019-05-21 NOTE — Pre-Procedure Instructions (Signed)
[facility] # [place], [place] [address] Phone: [phone] Fax: [phone]  [facility] - [place], [place] - [address] [place] [address] Phone: [phone] Fax: [phone]     Your procedure is scheduled on Tuesday, March 9th.   Report to [facility] Main Entrance "A" at 08:45 A.M., and check in at the Admitting office.   Call this number if you have problems the morning of surgery:  [phone]  Call [phone] if you have any questions prior to your surgery date Monday-Friday 8am-4pm.    Remember:  Do not eat or drink after midnight the night before your surgery.    Stop apixaban Arne Cleveland) 05/21/19.   Continue taking all current medications without change through the day before surgery.   On the morning of surgery do not take any medications.   The Morning of Surgery  Do not wear jewelry.  Do not wear lotions, powders, colognes, or deodorant.  Men may shave face and neck.  Do not bring valuables to the hospital.  [facility] is not responsible for any belongings or valuables.  If you are a smoker, DO NOT Smoke 24 hours prior to surgery.  If you wear a CPAP at night please bring your mask the morning of surgery .  Remember that you must have someone to transport you home after your surgery, and remain with you for 24 hours if you are discharged the same day.   Please bring cases for contacts, glasses, hearing aids, dentures or bridgework because it cannot be worn into surgery.    Leave your suitcase in the car.  After surgery it may be brought to your room.  For patients admitted to the hospital, discharge time will be determined by your treatment team.  Patients discharged the day of surgery will not be allowed to drive home.    Special instructions:   [facility]- Preparing For Surgery  Before surgery, you can play an  important role. Because skin is not sterile, your skin needs to be as free of germs as possible. You can reduce the number of germs on your skin by washing with CHG (chlorahexidine gluconate) Soap before surgery.  CHG is an antiseptic cleaner which kills germs and bonds with the skin to continue killing germs even after washing.    Oral Hygiene is also important to reduce your risk of infection.  Remember - BRUSH YOUR TEETH THE MORNING OF SURGERY WITH YOUR REGULAR TOOTHPASTE  Please do not use if you have an allergy to CHG or antibacterial soaps. If your skin becomes reddened/irritated stop using the CHG.  Do not shave (including legs and underarms) for at least 48 hours prior to first CHG shower. It is OK to shave your face.  Please follow these instructions carefully.   1. Shower the NIGHT BEFORE SURGERY and the MORNING OF SURGERY with CHG Soap.   2. If you chose to wash your hair, wash your hair first as usual with your normal shampoo.  3. After you shampoo, rinse your hair and body thoroughly to remove the shampoo.  4. Use CHG as you would any other liquid soap. You can apply CHG directly to the skin and wash gently with a scrungie or a clean washcloth.   5. Apply the CHG Soap to your body ONLY FROM THE NECK DOWN.  Do not use on open wounds or open sores.  Avoid contact with your eyes, ears, mouth and genitals (private parts). Wash Face and genitals (private parts)  with your normal soap.   6. Wash thoroughly, paying special attention to the area where your surgery will be performed.  7. Thoroughly rinse your body with warm water from the neck down.  8. DO NOT shower/wash with your normal soap after using and rinsing off the CHG Soap.  9. Pat yourself dry with a CLEAN TOWEL.  10. Wear CLEAN PAJAMAS to bed the night before surgery, wear comfortable clothes the morning of surgery  11. Place CLEAN SHEETS on your bed the night of your first shower and DO NOT SLEEP WITH PETS.    Day of  Surgery:  Please shower the morning of surgery with the CHG soap Do not apply any deodorants/lotions. Please wear clean clothes to the hospital/surgery center.   Remember to brush your teeth WITH YOUR REGULAR TOOTHPASTE.   Please read over the following fact sheets that you were given.

## 2019-05-21 NOTE — Telephone Encounter (Signed)
      [address]       [place] [address]             [phone]     CARDIOTHORACIC SURGERY TELEPHONE VIRTUAL OFFICE NOTE   Primary Cardiologist is Buford Dresser, MD PCP is [name], MD   I spoke with [name] (DOB [birth year] ) via telephone on 05/21/2019 at 4:59 PM and verified that I was speaking with the correct person using more than one form of identification.  His wife [name] subsequently spoke with me and explained to her husband because [name] did not have his hearing aids and could not hear well enough over the telephone.    I explained that our entire team had reviewed the patient's transesophageal echocardiogram which was performed yesterday.  We were all impressed that the there was convincing evidence for the presence of severe low flow low gradient aortic stenosis and the severity of mitral regurgitation was not as bad as we had been concerned about previously.  Under the circumstances we are hopeful that transcatheter aortic valve replacement may provide substantial symptomatic improvement and potentially prolong his life.  We reviewed the risks associated with transcatheter aortic valve replacement and plan to proceed on Tuesday May 26, 2019.  Following the decision to proceed with transcatheter aortic valve replacement, a discussion has been held regarding what types of management strategies would be attempted intraoperatively in the event of life-threatening complications, including whether or not the patient would be considered a candidate for the use of cardiopulmonary bypass and/or conversion to open sternotomy for attempted surgical intervention.  The patient understands that should a potentially life-threatening complication develop we would not attempt emergency median sternotomy and/or other aggressive surgical procedures.  We discussed a variety of complications that might develop including but not limited to risks of death,  stroke, paravalvular leak, aortic dissection or other major vascular complications, aortic annulus rupture, device embolization, cardiac rupture or perforation, mitral regurgitation, acute myocardial infarction, arrhythmia, heart block or bradycardia requiring permanent pacemaker placement, congestive heart failure, respiratory failure, renal failure, pneumonia, infection, other late complications related to structural valve deterioration or migration, or other complications that might ultimately cause a temporary or permanent loss of functional independence or other long term morbidity.  All questions were answered.   I discussed limitations of evaluation and management via telephone.  The patient was advised to call back for repeat telephone consultation or to seek an in-person evaluation if questions arise or the patient's clinical condition changes in any significant manner.  I spent in excess of 10 minutes of non-face-to-face time during the conduct of this telephone virtual office consultation.     Valentina Gu. Roxy Manns, MD 05/21/2019 4:59 PM

## 2019-05-22 ENCOUNTER — Other Ambulatory Visit: Payer: Self-pay

## 2019-05-22 ENCOUNTER — Encounter (HOSPITAL_COMMUNITY): Payer: Self-pay

## 2019-05-22 ENCOUNTER — Other Ambulatory Visit: Payer: Self-pay | Admitting: Physician Assistant

## 2019-05-22 ENCOUNTER — Ambulatory Visit (HOSPITAL_COMMUNITY)
Admission: RE | Admit: 2019-05-22 | Discharge: 2019-05-22 | Disposition: A | Discharge status: Home / Self Care | Payer: PPO | Source: Ambulatory Visit | Attending: Cardiovascular Disease | Admitting: Cardiovascular Disease

## 2019-05-22 ENCOUNTER — Other Ambulatory Visit (HOSPITAL_COMMUNITY)
Admission: RE | Admit: 2019-05-22 | Discharge: 2019-05-22 | Disposition: A | Discharge status: Home / Self Care | Payer: PPO | Source: Ambulatory Visit | Attending: Cardiovascular Disease | Admitting: Cardiovascular Disease

## 2019-05-22 ENCOUNTER — Encounter (HOSPITAL_COMMUNITY)
Admission: RE | Admit: 2019-05-22 | Discharge: 2019-05-22 | Disposition: A | Discharge status: Home / Self Care | Payer: PPO | Source: Ambulatory Visit | Attending: Cardiovascular Disease | Admitting: Cardiovascular Disease

## 2019-05-22 DIAGNOSIS — J90 Pleural effusion, not elsewhere classified: Secondary | ICD-10-CM | POA: Diagnosis not present

## 2019-05-22 DIAGNOSIS — I454 Nonspecific intraventricular block: Secondary | ICD-10-CM | POA: Diagnosis not present

## 2019-05-22 DIAGNOSIS — I350 Nonrheumatic aortic (valve) stenosis: Secondary | ICD-10-CM | POA: Insufficient documentation

## 2019-05-22 DIAGNOSIS — Z20822 Contact with and (suspected) exposure to covid-19: Secondary | ICD-10-CM | POA: Diagnosis not present

## 2019-05-22 DIAGNOSIS — I4891 Unspecified atrial fibrillation: Secondary | ICD-10-CM | POA: Diagnosis not present

## 2019-05-22 HISTORY — DX: Thrombocytopenia, unspecified: D69.6

## 2019-05-22 LAB — COMPREHENSIVE METABOLIC PANEL
ALT: 17 U/L (ref 0–44)
AST: 25 U/L (ref 15–41)
Albumin: 4.2 g/dL (ref 3.5–5.0)
Alkaline Phosphatase: 97 U/L (ref 38–126)
Anion gap: 14 (ref 5–15)
BUN: 39 mg/dL — ABNORMAL HIGH (ref 8–23)
CO2: 25 mmol/L (ref 22–32)
Calcium: 9.2 mg/dL (ref 8.9–10.3)
Chloride: 99 mmol/L (ref 98–111)
Creatinine, Ser: 2.33 mg/dL — ABNORMAL HIGH (ref 0.61–1.24)
GFR calc Af Amer: 28 mL/min — ABNORMAL LOW (ref >60)
GFR calc non Af Amer: 25 mL/min — ABNORMAL LOW (ref >60)
Glucose, Bld: 103 mg/dL — ABNORMAL HIGH (ref 70–99)
Potassium: 3 mmol/L — ABNORMAL LOW (ref 3.5–5.1)
Sodium: 138 mmol/L (ref 135–145)
Total Bilirubin: 2.4 mg/dL — ABNORMAL HIGH (ref 0.3–1.2)
Total Protein: 7.4 g/dL (ref 6.5–8.1)

## 2019-05-22 LAB — URINALYSIS, ROUTINE W REFLEX MICROSCOPIC
Bilirubin Urine: NEGATIVE
Glucose, UA: NEGATIVE mg/dL
Hgb urine dipstick: NEGATIVE
Ketones, ur: NEGATIVE mg/dL
Leukocytes,Ua: NEGATIVE
Nitrite: NEGATIVE
Protein, ur: NEGATIVE mg/dL
Specific Gravity, Urine: 1.009 (ref 1.005–1.030)
pH: 6 (ref 5.0–8.0)

## 2019-05-22 LAB — TYPE AND SCREEN
ABO/RH(D): O POS
Antibody Screen: NEGATIVE

## 2019-05-22 LAB — CBC
HCT: 40.3 % (ref 39.0–52.0)
Hemoglobin: 13.1 g/dL (ref 13.0–17.0)
MCH: 31.1 pg (ref 26.0–34.0)
MCHC: 32.5 g/dL (ref 30.0–36.0)
MCV: 95.7 fL (ref 80.0–100.0)
Platelets: 74 10*3/uL — ABNORMAL LOW (ref 150–400)
RBC: 4.21 MIL/uL — ABNORMAL LOW (ref 4.22–5.81)
RDW: 16.6 % — ABNORMAL HIGH (ref 11.5–15.5)
WBC: 5.2 10*3/uL (ref 4.0–10.5)
nRBC: 0 % (ref 0.0–0.2)

## 2019-05-22 LAB — SURGICAL PCR SCREEN
MRSA, PCR: NEGATIVE
Staphylococcus aureus: NEGATIVE

## 2019-05-22 LAB — PROTIME-INR
INR: 1.4 — ABNORMAL HIGH (ref 0.8–1.2)
Prothrombin Time: 17.2 seconds — ABNORMAL HIGH (ref 11.4–15.2)

## 2019-05-22 LAB — ABO/RH: ABO/RH(D): O POS

## 2019-05-22 LAB — APTT: aPTT: 31 seconds (ref 24–36)

## 2019-05-22 LAB — HEMOGLOBIN A1C
Hgb A1c MFr Bld: 5.5 % (ref 4.8–5.6)
Mean Plasma Glucose: 111.15 mg/dL

## 2019-05-22 LAB — SARS CORONAVIRUS 2 (TAT 6-24 HRS): SARS Coronavirus 2: NEGATIVE

## 2019-05-22 LAB — BRAIN NATRIURETIC PEPTIDE: B Natriuretic Peptide: 3448 pg/mL — ABNORMAL HIGH (ref 0.0–100.0)

## 2019-05-22 MED ORDER — MUPIROCIN 2 % EX OINT: TOPICAL_OINTMENT | Freq: Two times a day (BID) | CUTANEOUS | 0 refills | Status: DC

## 2019-05-22 MED ORDER — POTASSIUM CHLORIDE ER 20 MEQ PO TBCR: 20.0000 meq | EXTENDED_RELEASE_TABLET | Freq: Two times a day (BID) | ORAL | 1 refills | Status: DC

## 2019-05-22 NOTE — Progress Notes (Signed)
Abnormal labs resulted. Dr. Burt Knack notified via [place].

## 2019-05-22 NOTE — Progress Notes (Signed)
PCP - Dr. [name] Cardiologist - Dr. Buford Dresser Pulmonologist - Dr. Brand Males  PPM/ICD - denies  Chest x-ray - 05/22/2019 EKG - 05/22/2019 Stress Test - 02/28/10 ECHO TEE - 05/20/2019  Cardiac Cath - 04/28/2019  Sleep Study - 2020 BIPAP - per patient's wife "wears it every other night or so"  Blood Thinner Instructions: Eliquis: last dose 05/20/2019 per patient's wife Aspirin Instructions: N/A  ERAS Protcol - No  COVID TEST- Scheduled for today 05/22/2019 after PAT appointment. Patient and patient's wife verbalized understanding of self-quarantine instructions, appointment time and place.  Anesthesia review: YES, cardiac hx  Patient denies shortness of breath, fever, cough and chest pain at PAT appointment  All instructions explained to the patient, with a verbal understanding of the material. Patient agrees to go over the instructions while at home for a better understanding. Patient also instructed to self quarantine after being tested for COVID-19. The opportunity to ask questions was provided.

## 2019-05-25 ENCOUNTER — Encounter (HOSPITAL_COMMUNITY): Payer: Self-pay

## 2019-05-25 MED ORDER — MAGNESIUM SULFATE 50 % IJ SOLN
40.0000 meq | INTRAMUSCULAR | Status: DC
  Filled 2019-05-25: qty 9.85

## 2019-05-25 MED ORDER — SODIUM CHLORIDE 0.9 % IV SOLN
INTRAVENOUS | Status: DC
  Filled 2019-05-25: qty 30

## 2019-05-25 MED ORDER — POTASSIUM CHLORIDE 2 MEQ/ML IV SOLN
80.0000 meq | INTRAVENOUS | Status: DC
  Filled 2019-05-25: qty 40

## 2019-05-25 MED ORDER — VANCOMYCIN HCL 1250 MG/250ML IV SOLN
1250.0000 mg | INTRAVENOUS | Status: AC
  Administered 2019-05-26: 11:00:00 1250 mg via INTRAVENOUS
  Filled 2019-05-25: qty 250

## 2019-05-25 MED ORDER — NOREPINEPHRINE 4 MG/250ML-% IV SOLN
0.0000 ug/min | INTRAVENOUS | Status: DC
  Filled 2019-05-25: qty 250

## 2019-05-25 MED ORDER — DEXMEDETOMIDINE HCL IN NACL 400 MCG/100ML IV SOLN
0.1000 ug/kg/h | INTRAVENOUS | Status: AC
  Administered 2019-05-26: 1 ug/kg/h via INTRAVENOUS
  Filled 2019-05-25: qty 100

## 2019-05-25 MED ORDER — SODIUM CHLORIDE 0.9 % IV SOLN
1.5000 g | INTRAVENOUS | Status: AC
  Administered 2019-05-26: 1.5 g via INTRAVENOUS
  Filled 2019-05-25: qty 1.5

## 2019-05-25 NOTE — Anesthesia Preprocedure Evaluation (Addendum)
Anesthesia Evaluation  Patient identified by MRN, date of birth, ID band Patient awake    Reviewed: Allergy & Precautions, NPO status , Patient's Chart, lab work & pertinent test results  Airway Mallampati: I  TM Distance: >3 FB Neck ROM: Full    Dental   Pulmonary sleep apnea , former smoker,    Pulmonary exam normal        Cardiovascular hypertension, Pt. on medications + CAD and + CABG  Normal cardiovascular exam     Neuro/Psych TIA   GI/Hepatic   Endo/Other    Renal/GU Renal InsufficiencyRenal disease     Musculoskeletal   Abdominal   Peds  Hematology   Anesthesia Other Findings   Reproductive/Obstetrics                             Anesthesia Physical Anesthesia Plan  ASA: III  Anesthesia Plan: MAC   Post-op Pain Management:    Induction: Intravenous  PONV Risk Score and Plan: 1 and Treatment may vary due to age or medical condition  Airway Management Planned: Simple Face Mask  Additional Equipment: Arterial line  Intra-op Plan:   Post-operative Plan:   Informed Consent: I have reviewed the patients History and Physical, chart, labs and discussed the procedure including the risks, benefits and alternatives for the proposed anesthesia with the patient or authorized representative who has indicated his/her understanding and acceptance.       Plan Discussed with: CRNA and Surgeon  Anesthesia Plan Comments: (See PAT note written 05/25/2019 by Myra Gianotti, PA-C. )       Anesthesia Quick Evaluation

## 2019-05-25 NOTE — Progress Notes (Signed)
Anesthesia Chart Review:  Case: [id number] Date/Time: 05/26/19 1030   Procedures:      TRANSCATHETER AORTIC VALVE REPLACEMENT, TRANSFEMORAL (N/A Chest)     TRANSESOPHAGEAL ECHOCARDIOGRAM (TEE) (N/A )   Anesthesia type: General   Pre-op diagnosis: Severe Aortic Stenosis   Location: [facility] OR ROOM 16 / [place] [facility]   Surgeons: Sherren Mocha, MD    CT Surgeon: Darylene Price, MD  DISCUSSION: Patient is an 86 year old Phillips scheduled for the above procedure.  History includes former smoker (quit 1957), CAD and MVP/MV disease (s/p CABG/MV repair: SVG-OM, quadrangular resection of the posterior leaflet with artificial Gore-Tex neocords placement and ring annuloplasty using a Micheal mm Seguin semirigid ring 09/16/02; mild-moderate MR 05/20/19 TEE), severe AS, afib, chronic combined CHF, CKD (stage III), dyslipidemia, HTN, TIA (> 10 years ago), carotid artery stenosis (mild 1-39% 05/13/19), OSA (intermittent BiPAP use), thrombocytopenia.  - RUL lung nodule 05/13/19 CTA, repeat in 3-6 months recommended. CTA also showed morphologic changes in the liver indicative of cirrhosis with trace volume ascites.  According to PCP Dr. Kenton Kingfisher notes (03/19/19, scanned under Media tab), patient's thrombocytopenia is being followed at the [facility]. 05/22/19 PLT count was 74K, slightly down from 88K on 04/24/19. Will attempt to get records from [facility] request faxed 05/25/19.    Creatinine 2.33 (previousy 1.78-2.10 since 12/2018), K 3.0, supplemented. Dr. Burt Knack has requested repeat BMET for the day of surgery.   PT 17.2, INR 1.4--Last Eliquis 05/20/19. Presurgical COVID-19 test negative on 05/22/19. He needs an ABG and repeat BMET on the day of surgery. If additional pertinent records received from the [facility] then I will plan to update my note, otherwise anesthesia team to evaluate on the day of surgery.    VS: BP 120/80   Pulse 93   Temp (!) 36.3 C   Resp 17   Ht 5\' 9"  (1.753 m)   Wt 77.5 kg   SpO2 91%   BMI 25.24 kg/m     PROVIDERS: [name], MD is PCP. He is also followed at the [facility]. Buford Dresser, MD is primary cardiologist Brand Males, MD is pulmonologist. He has had CT findings suggestive of ILD (no definite imaging findings to suggest ILD on 12/09/18 CT, findings more suggestive of CHF). March follow-up recommended following treatment of cardiac issues.   LABS: Preoperative labs noted. K 3.0. Cr 2.33. PLT count 74K, PT 17.2, INR 1.4. BNP 3448. Known CKD with Creatinine trends (1.78-2.10 since 01/05/19). PLT count 74K, previously 88K on 04/24/19. PAT RN sent a message to Dr. Burt Knack regarding abnormal labs--he also marked as reviewed.  (all labs ordered are listed, but only abnormal results are displayed)  Labs Reviewed  BRAIN NATRIURETIC PEPTIDE - Abnormal; Notable for the following components:      Result Value   B Natriuretic Peptide 3,448.0 (*)    All other components within normal limits  CBC - Abnormal; Notable for the following components:   RBC 4.21 (*)    RDW 16.6 (*)    Platelets 74 (*)    All other components within normal limits  COMPREHENSIVE METABOLIC PANEL - Abnormal; Notable for the following components:   Potassium 3.0 (*)    Glucose, Bld 103 (*)    BUN 39 (*)    Creatinine, Ser 2.33 (*)    Total Bilirubin 2.4 (*)    GFR calc non Af Amer 25 (*)    GFR calc Af Amer Micheal (*)    All other components within normal limits  PROTIME-INR -  Abnormal; Notable for the following components:   Prothrombin Time 17.2 (*)    INR 1.4 (*)    All other components within normal limits  SURGICAL PCR SCREEN  APTT  HEMOGLOBIN A1C  URINALYSIS, ROUTINE W REFLEX MICROSCOPIC  TYPE AND SCREEN    PFTs 04/21/19: FVC 2.98 (80%). FEV1 2.43 (95%). DLCO unc 15.32 (66%).   IMAGES: CXR 05/22/19: IMPRESSION: No acute cardiopulmonary disease.  Chronic bilateral effusions. Stable cardiomegaly. Postsurgical changes related to prior CABG. Aortic Atherosclerosis  (ICD10-I70.0).  CTA chest/abd/pelvis 05/13/19: IMPRESSION: 1. Vascular findings and measurements pertinent to potential TAVR procedure, as detailed above. 2. Severe thickening calcification of the aortic valve, compatible with reported clinical history of severe aortic stenosis. 3. Enlarging right upper lobe pulmonary nodule measuring 9 x 5 mm (mean diameter of 7 mm), which previously had a mean diameter of only 4 mm on 12/09/2018. This nodule is concerning, but is currently likely below the level of PET-CT resolution. Close attention on repeat noncontrast chest CT is recommended in 3-6 months to reassess this lesion. 4. Moderate bilateral pleural effusions lying dependently with mild dependent subsegmental atelectasis in the lower lobes of the lungs bilaterally. 5. Morphologic changes in the liver indicative of underlying cirrhosis. Trace volume of ascites. 6. Additional incidental findings, as above. [See full report]   EKG: 05/22/19: Atrial fibrillation at 92 bpm Non-specific intra-ventricular conduction block Abnormal ECG Since last tracing rate slower Confirmed by Charolette Forward ([id number]) on 05/22/2019 9:17:56 PM   CV: TEE 05/20/19: IMPRESSIONS  1. Left ventricular ejection fraction, by estimation, is 20 to 25%. The  left ventricle has severely decreased function. The left ventricle  demonstrates global hypokinesis. The left ventricular internal cavity size  was moderately dilated.  2. Right ventricular systolic function is moderately reduced. The right  ventricular size is moderately enlarged. There is moderately elevated  pulmonary artery systolic pressure.  3. Left atrial size was moderately dilated.  4. Right atrial size was moderately dilated.  5. The mitral valve has been repaired. There is a prosthetic annuloplasty  ring present in the mitral position. Posterior leaflet restriction causes  coaptation defect at P3. Mild to moderate mitral valve regurgitation. 2D   ERO 0.3 cm^2, RV 33cc. 3D ERO  0.2 cm^2.  6. The aortic valve is abnormal. Aortic valve regurgitation is not  visualized. Severe aortic valve stenosis. Vmax 2.7 m/s, MG 85mmHg. Valve  appears severely calcified and 3D planimetry valve area was 0.4 cm^2.  Findings consistent with low flow, low  gradient severe AS.    CT coronary 05/13/19: IMPRESSION: 1. Trileaflet aortic valve with moderately calcified leaflets, severely reduced leaflets opening and no calcifications extending into the LVOT. Aortic valve calcium score 2655 consistent with severe aortic stenosis. Annular measurements suitable for delivery of a 29 mm Edwrads-SAPIEN 3 Ultra valve. 2. Sufficient coronary to annulus distance. 3. Optimum Fluoroscopic Angle for Delivery: RAO 2 CRA 2. 4. No thrombus in the left atrial appendage. 5. Dilated pulmonary artery measuring 34 mm suggestive of pulmonary hypertension.   Carotid US 05/13/19: Summary:  - Right Carotid: Velocities in the right ICA are consistent with a 1-39% stenosis.  - Left Carotid: Velocities in the left ICA are consistent with a 1-39% stenosis.  - Vertebrals: Bilateral vertebral arteries demonstrate antegrade flow.  - Subclavians: Normal flow hemodynamics were seen in bilateral subclavian arteries.    Cardiac cath 04/28/19: Left Anterior Descending  There is mild diffuse disease throughout the vessel. The LAD is patent throughout. There is mild  nonobstructive disease present. The vessel reaches the LV apex. The diagonal branches are small in caliber with mild diffuse disease.  Left Circumflex  There is mild diffuse disease throughout the vessel. The circumflex is a large vessel. There is a large second OM branch with mild diffuse nonobstructive disease noted. The first OM is occluded and fills from the graft.  First Obtuse Marginal Branch  1st Mrg lesion 100% stenosed  1st Mrg lesion is 100% stenosed.  Right Coronary Artery  There is mild diffuse disease  throughout the vessel. Large, dominant vessel. There is diffuse nonobstructive stenosis less than 50% present. PDA and PLA branches are supplied by the RCA.  Mid RCA lesion 50% stenosed  Mid RCA lesion is 50% stenosed.  Saphenous Graft To 1st Mrg  SVG. The saphenous vein graft to first OM is patent without stenosis.  CONCLUSION: 1.  Nonobstructive coronary disease with mild diffuse stenosis of the LAD, left circumflex, and RCA 2.  Total occlusion of the first OM branch of the circumflex with continued patency of the saphenous vein graft to OM 3.  Severe aortic stenosis with mean gradient 33 mmHg and calculated valve area 0.8 cm 4.  Large V waves consistent with severe mitral regurgitation Recommendation: Continue multidisciplinary evaluation for this patient with severe low-flow low gradient aortic stenosis, probable severe mitral regurgitation with previous mitral repair, and progressive heart failure symptoms    Past Medical History:  Diagnosis Date  . Adenomatous colon polyp   . Aortic stenosis    a. mild by echo 2017.  Marland Kitchen CAD (coronary artery disease)    a. S/P CABG x 1 @ time of MV annuloplasty;  b. 02/2010 ETT: neg for ischemia.  . Carotid artery stenosis    1-39% by dopplers 10/2016  . Chronic combined systolic (congestive) and diastolic (congestive) heart failure ([facility])   . CKD (chronic kidney disease), stage III   . Current use of long term anticoagulation    a. Coumadin in setting of afib.  . Diverticulosis    Colonoscopy  . Dyslipidemia   . Essential hypertension   . H/O mitral valve repair   . H/O mitral valve repair 09/16/2002   Complex valvuloplasty including quadrangular resection of posterior leaflet with artificial Gore-tex neochords and Micheal mm Seguin ring annuloplasty - Dr Servando Snare  . Hemorrhoids   . Hyperlipidemia   . Hypertension   . Permanent atrial fibrillation (HCC)    a. CHA2DS2VASc = 6 -->chronic coumadin;  b. Prev on amio-->d/c 2/2 hypothyroidism. >>  resumed 5/16. c. Notes from 2017 indicate interstitial lung disease by pulm appt, question amio toxicity, also increased liver attenuation. Rate control pursued and amiodarone discontinued.  . S/P CABG x 1 09/16/2002   SVG to OM  . Sleep apnea 12/2018   per patient's spouse wears BIPAP "every other night or so"  . TIA (transient ischemic attack)     Past Surgical History:  Procedure Laterality Date  . CATARACT EXTRACTION W/ INTRAOCULAR LENS  IMPLANT, BILATERAL     per patient about 4 years ago  . CORONARY ARTERY BYPASS GRAFT    . MITRAL VALVE REPAIR    . RIGHT/LEFT HEART CATH AND CORONARY/GRAFT ANGIOGRAPHY N/A 04/28/2019   Procedure: RIGHT/LEFT HEART CATH AND CORONARY/GRAFT ANGIOGRAPHY;  Surgeon: Sherren Mocha, MD;  Location: [place] [facility];  Service: Cardiovascular;  Laterality: N/A;  . TEE WITHOUT CARDIOVERSION N/A 05/20/2019   Procedure: TRANSESOPHAGEAL ECHOCARDIOGRAM (TEE);  Surgeon: Donato Heinz, MD;  Location: [facility];  Service:  Cardiovascular;  Laterality: N/A;    MEDICATIONS: . amoxicillin (AMOXIL) 500 MG tablet  . apixaban (ELIQUIS) 5 MG TABS tablet  . Cholecalciferol (VITAMIN D-3) 1000 units CAPS  . hydrocortisone (ANUSOL-HC) 2.5 % rectal cream  . levothyroxine (SYNTHROID) 75 MCG tablet  . metoprolol succinate (TOPROL-XL) 50 MG 24 hr tablet  . mirtazapine (REMERON) 15 MG tablet  . mupirocin ointment (BACTROBAN) 2 %  . Polyethyl Glycol-Propyl Glycol (SYSTANE OP)  . potassium chloride 20 MEQ TBCR  . pravastatin (PRAVACHOL) 80 MG tablet  . torsemide (DEMADEX) 20 MG tablet  . vitamin B-12 (CYANOCOBALAMIN) 1000 MCG tablet   No current facility-administered medications for this encounter.   Derrill Memo ON 05/26/2019] cefUROXime (ZINACEF) 1.5 g in sodium chloride 0.9 % 100 mL IVPB  . [START ON 05/26/2019] dexmedetomidine (PRECEDEX) 400 MCG/100ML (4 mcg/mL) infusion  . [START ON 05/26/2019] heparin 30,000 units/NS 1000 mL solution for CELLSAVER  . [START ON 05/26/2019]  magnesium sulfate (IV Push/IM) injection 40 mEq  . [START ON 05/26/2019] norepinephrine (LEVOPHED) 4mg  in 258mL premix infusion  . [START ON 05/26/2019] potassium chloride injection 80 mEq  . [START ON 05/26/2019] vancomycin (VANCOREADY) IVPB 1250 mg/250 mL     Myra Gianotti, PA-C Surgical Short Stay/Anesthesiology [facility] Phone [phone] [facility] Phone [phone] 05/25/2019 11:45 AM

## 2019-05-26 ENCOUNTER — Other Ambulatory Visit: Payer: Self-pay | Admitting: Physician Assistant

## 2019-05-26 ENCOUNTER — Inpatient Hospital Stay (HOSPITAL_COMMUNITY)
Admission: RE | Admit: 2019-05-26 | Discharge: 2019-05-28 | DRG: 266 | Disposition: A | Discharge status: Home / Self Care | Payer: PPO | Attending: Cardiovascular Disease | Admitting: Cardiovascular Disease

## 2019-05-26 ENCOUNTER — Other Ambulatory Visit: Payer: Self-pay

## 2019-05-26 ENCOUNTER — Inpatient Hospital Stay (HOSPITAL_COMMUNITY)
Admission: RE | Admit: 2019-05-26 | Discharge: 2019-05-26 | Disposition: A | Discharge status: Home / Self Care | Payer: PPO | Source: Ambulatory Visit | Attending: Cardiovascular Disease | Admitting: Cardiovascular Disease

## 2019-05-26 ENCOUNTER — Inpatient Hospital Stay (HOSPITAL_COMMUNITY): Payer: PPO | Admitting: Vascular Surgery

## 2019-05-26 ENCOUNTER — Encounter (HOSPITAL_COMMUNITY)
Admission: RE | Disposition: A | Discharge status: Home / Self Care | Payer: Self-pay | Source: Home / Self Care | Attending: Cardiovascular Disease

## 2019-05-26 ENCOUNTER — Encounter (HOSPITAL_COMMUNITY): Payer: Self-pay | Admitting: Cardiovascular Disease

## 2019-05-26 ENCOUNTER — Inpatient Hospital Stay (HOSPITAL_COMMUNITY): Payer: PPO | Admitting: Anesthesiology

## 2019-05-26 DIAGNOSIS — I700 Atherosclerosis of aorta: Secondary | ICD-10-CM | POA: Diagnosis not present

## 2019-05-26 DIAGNOSIS — E785 Hyperlipidemia, unspecified: Secondary | ICD-10-CM | POA: Diagnosis not present

## 2019-05-26 DIAGNOSIS — G4730 Sleep apnea, unspecified: Secondary | ICD-10-CM | POA: Diagnosis not present

## 2019-05-26 DIAGNOSIS — I2582 Chronic total occlusion of coronary artery: Secondary | ICD-10-CM | POA: Diagnosis present

## 2019-05-26 DIAGNOSIS — E876 Hypokalemia: Secondary | ICD-10-CM | POA: Diagnosis not present

## 2019-05-26 DIAGNOSIS — I420 Dilated cardiomyopathy: Secondary | ICD-10-CM | POA: Diagnosis present

## 2019-05-26 DIAGNOSIS — E039 Hypothyroidism, unspecified: Secondary | ICD-10-CM | POA: Diagnosis not present

## 2019-05-26 DIAGNOSIS — I2510 Atherosclerotic heart disease of native coronary artery without angina pectoris: Secondary | ICD-10-CM | POA: Diagnosis present

## 2019-05-26 DIAGNOSIS — I350 Nonrheumatic aortic (valve) stenosis: Secondary | ICD-10-CM

## 2019-05-26 DIAGNOSIS — Z6823 Body mass index (BMI) 23.0-23.9, adult: Secondary | ICD-10-CM

## 2019-05-26 DIAGNOSIS — Z86010 Personal history of colonic polyps: Secondary | ICD-10-CM

## 2019-05-26 DIAGNOSIS — Z8249 Family history of ischemic heart disease and other diseases of the circulatory system: Secondary | ICD-10-CM | POA: Diagnosis not present

## 2019-05-26 DIAGNOSIS — I5043 Acute on chronic combined systolic (congestive) and diastolic (congestive) heart failure: Secondary | ICD-10-CM | POA: Diagnosis not present

## 2019-05-26 DIAGNOSIS — Z951 Presence of aortocoronary bypass graft: Secondary | ICD-10-CM

## 2019-05-26 DIAGNOSIS — I272 Pulmonary hypertension, unspecified: Secondary | ICD-10-CM | POA: Diagnosis not present

## 2019-05-26 DIAGNOSIS — Z79890 Hormone replacement therapy (postmenopausal): Secondary | ICD-10-CM

## 2019-05-26 DIAGNOSIS — Z952 Presence of prosthetic heart valve: Secondary | ICD-10-CM

## 2019-05-26 DIAGNOSIS — N184 Chronic kidney disease, stage 4 (severe): Secondary | ICD-10-CM | POA: Diagnosis present

## 2019-05-26 DIAGNOSIS — N183 Chronic kidney disease, stage 3 unspecified: Secondary | ICD-10-CM | POA: Diagnosis not present

## 2019-05-26 DIAGNOSIS — I6529 Occlusion and stenosis of unspecified carotid artery: Secondary | ICD-10-CM | POA: Diagnosis not present

## 2019-05-26 DIAGNOSIS — I4821 Permanent atrial fibrillation: Secondary | ICD-10-CM | POA: Diagnosis present

## 2019-05-26 DIAGNOSIS — E440 Moderate protein-calorie malnutrition: Secondary | ICD-10-CM | POA: Insufficient documentation

## 2019-05-26 DIAGNOSIS — Z823 Family history of stroke: Secondary | ICD-10-CM | POA: Diagnosis not present

## 2019-05-26 DIAGNOSIS — D696 Thrombocytopenia, unspecified: Secondary | ICD-10-CM | POA: Diagnosis not present

## 2019-05-26 DIAGNOSIS — I25810 Atherosclerosis of coronary artery bypass graft(s) without angina pectoris: Secondary | ICD-10-CM | POA: Diagnosis not present

## 2019-05-26 DIAGNOSIS — Z79899 Other long term (current) drug therapy: Secondary | ICD-10-CM

## 2019-05-26 DIAGNOSIS — I080 Rheumatic disorders of both mitral and aortic valves: Secondary | ICD-10-CM | POA: Diagnosis not present

## 2019-05-26 DIAGNOSIS — Z87891 Personal history of nicotine dependence: Secondary | ICD-10-CM

## 2019-05-26 DIAGNOSIS — I130 Hypertensive heart and chronic kidney disease with heart failure and stage 1 through stage 4 chronic kidney disease, or unspecified chronic kidney disease: Secondary | ICD-10-CM | POA: Diagnosis present

## 2019-05-26 DIAGNOSIS — J849 Interstitial pulmonary disease, unspecified: Secondary | ICD-10-CM | POA: Diagnosis not present

## 2019-05-26 DIAGNOSIS — Z006 Encounter for examination for normal comparison and control in clinical research program: Secondary | ICD-10-CM | POA: Diagnosis not present

## 2019-05-26 DIAGNOSIS — Z7901 Long term (current) use of anticoagulants: Secondary | ICD-10-CM

## 2019-05-26 DIAGNOSIS — Z8673 Personal history of transient ischemic attack (TIA), and cerebral infarction without residual deficits: Secondary | ICD-10-CM

## 2019-05-26 DIAGNOSIS — Z9889 Other specified postprocedural states: Secondary | ICD-10-CM

## 2019-05-26 DIAGNOSIS — Z888 Allergy status to other drugs, medicaments and biological substances status: Secondary | ICD-10-CM

## 2019-05-26 HISTORY — PX: TRANSCATHETER AORTIC VALVE REPLACEMENT, TRANSFEMORAL: SHX6400

## 2019-05-26 HISTORY — DX: Presence of prosthetic heart valve: Z95.2

## 2019-05-26 HISTORY — PX: INTRAOPERATIVE TRANSTHORACIC ECHOCARDIOGRAM: SHX6523

## 2019-05-26 LAB — POCT I-STAT, CHEM 8
BUN: 36 mg/dL — ABNORMAL HIGH (ref 8–23)
BUN: 35 mg/dL — ABNORMAL HIGH (ref 8–23)
BUN: 35 mg/dL — ABNORMAL HIGH (ref 8–23)
BUN: 33 mg/dL — ABNORMAL HIGH (ref 8–23)
Calcium, Ion: 1.16 mmol/L (ref 1.15–1.40)
Calcium, Ion: 1.15 mmol/L (ref 1.15–1.40)
Calcium, Ion: 1.18 mmol/L (ref 1.15–1.40)
Calcium, Ion: 1.14 mmol/L — ABNORMAL LOW (ref 1.15–1.40)
Chloride: 101 mmol/L (ref 98–111)
Chloride: 101 mmol/L (ref 98–111)
Chloride: 99 mmol/L (ref 98–111)
Chloride: 102 mmol/L (ref 98–111)
Creatinine, Ser: 2 mg/dL — ABNORMAL HIGH (ref 0.61–1.24)
Creatinine, Ser: 1.8 mg/dL — ABNORMAL HIGH (ref 0.61–1.24)
Creatinine, Ser: 1.9 mg/dL — ABNORMAL HIGH (ref 0.61–1.24)
Creatinine, Ser: 1.8 mg/dL — ABNORMAL HIGH (ref 0.61–1.24)
Glucose, Bld: 98 mg/dL (ref 70–99)
Glucose, Bld: 114 mg/dL — ABNORMAL HIGH (ref 70–99)
Glucose, Bld: 99 mg/dL (ref 70–99)
Glucose, Bld: 108 mg/dL — ABNORMAL HIGH (ref 70–99)
HCT: 38 % — ABNORMAL LOW (ref 39.0–52.0)
HCT: 33 % — ABNORMAL LOW (ref 39.0–52.0)
HCT: 36 % — ABNORMAL LOW (ref 39.0–52.0)
HCT: 43 % (ref 39.0–52.0)
Hemoglobin: 12.9 g/dL — ABNORMAL LOW (ref 13.0–17.0)
Hemoglobin: 11.2 g/dL — ABNORMAL LOW (ref 13.0–17.0)
Hemoglobin: 12.2 g/dL — ABNORMAL LOW (ref 13.0–17.0)
Hemoglobin: 14.6 g/dL (ref 13.0–17.0)
Potassium: 2.9 mmol/L — ABNORMAL LOW (ref 3.5–5.1)
Potassium: 2.6 mmol/L — CL (ref 3.5–5.1)
Potassium: 2.9 mmol/L — ABNORMAL LOW (ref 3.5–5.1)
Potassium: 2.9 mmol/L — ABNORMAL LOW (ref 3.5–5.1)
Sodium: 140 mmol/L (ref 135–145)
Sodium: 140 mmol/L (ref 135–145)
Sodium: 141 mmol/L (ref 135–145)
Sodium: 141 mmol/L (ref 135–145)
TCO2: 28 mmol/L (ref 22–32)
TCO2: 26 mmol/L (ref 22–32)
TCO2: 28 mmol/L (ref 22–32)
TCO2: 25 mmol/L (ref 22–32)

## 2019-05-26 LAB — BLOOD GAS, ARTERIAL
Acid-Base Excess: 1.9 mmol/L (ref 0.0–2.0)
Bicarbonate: 24.9 mmol/L (ref 20.0–28.0)
FIO2: 28
O2 Saturation: 98.7 %
Patient temperature: 37
pCO2 arterial: 32.1 mmHg (ref 32.0–48.0)
pH, Arterial: 7.502 — ABNORMAL HIGH (ref 7.350–7.450)
pO2, Arterial: 116 mmHg — ABNORMAL HIGH (ref 83.0–108.0)

## 2019-05-26 LAB — BASIC METABOLIC PANEL
Anion gap: 16 — ABNORMAL HIGH (ref 5–15)
BUN: 37 mg/dL — ABNORMAL HIGH (ref 8–23)
CO2: 22 mmol/L (ref 22–32)
Calcium: 9.1 mg/dL (ref 8.9–10.3)
Chloride: 100 mmol/L (ref 98–111)
Creatinine, Ser: 2.01 mg/dL — ABNORMAL HIGH (ref 0.61–1.24)
GFR calc Af Amer: 34 mL/min — ABNORMAL LOW (ref >60)
GFR calc non Af Amer: 29 mL/min — ABNORMAL LOW (ref >60)
Glucose, Bld: 100 mg/dL — ABNORMAL HIGH (ref 70–99)
Potassium: 2.8 mmol/L — ABNORMAL LOW (ref 3.5–5.1)
Sodium: 138 mmol/L (ref 135–145)

## 2019-05-26 SURGERY — IMPLANTATION, AORTIC VALVE, TRANSCATHETER, FEMORAL APPROACH
Anesthesia: Monitor Anesthesia Care | Site: Chest

## 2019-05-26 MED ORDER — LIDOCAINE HCL 1 % IJ SOLN
INTRAMUSCULAR | Status: DC | PRN
  Administered 2019-05-26: 10 mL

## 2019-05-26 MED ORDER — SODIUM CHLORIDE 0.9 % IV SOLN: INTRAVENOUS | Status: DC

## 2019-05-26 MED ORDER — ACETAMINOPHEN 650 MG RE SUPP: 650.0000 mg | Freq: Four times a day (QID) | RECTAL | Status: DC | PRN

## 2019-05-26 MED ORDER — FENTANYL CITRATE (PF) 100 MCG/2ML IJ SOLN
INTRAMUSCULAR | Status: AC
  Filled 2019-05-26: qty 2

## 2019-05-26 MED ORDER — PHENYLEPHRINE 40 MCG/ML (10ML) SYRINGE FOR IV PUSH (FOR BLOOD PRESSURE SUPPORT)
PREFILLED_SYRINGE | INTRAVENOUS | Status: AC
  Filled 2019-05-26: qty 10

## 2019-05-26 MED ORDER — POTASSIUM CHLORIDE 10 MEQ/100ML IV SOLN
10.0000 meq | INTRAVENOUS | Status: AC
  Administered 2019-05-26 (×4): 10 meq via INTRAVENOUS
  Filled 2019-05-26 (×4): qty 100

## 2019-05-26 MED ORDER — LEVOTHYROXINE SODIUM 75 MCG PO TABS
75.0000 ug | ORAL_TABLET | Freq: Every day | ORAL | Status: DC
  Administered 2019-05-27 – 2019-05-28 (×2): 75 ug via ORAL
  Filled 2019-05-26 (×2): qty 1

## 2019-05-26 MED ORDER — SODIUM CHLORIDE 0.9 % IV SOLN
INTRAVENOUS | Status: AC
  Filled 2019-05-26 (×3): qty 1.2

## 2019-05-26 MED ORDER — POTASSIUM CHLORIDE 10 MEQ/100ML IV SOLN
INTRAVENOUS | Status: DC | PRN
  Administered 2019-05-26: 10 meq via INTRAVENOUS

## 2019-05-26 MED ORDER — CHLORHEXIDINE GLUCONATE CLOTH 2 % EX PADS
6.0000 | MEDICATED_PAD | Freq: Every day | CUTANEOUS | Status: DC
  Administered 2019-05-26 – 2019-05-27 (×2): 6 via TOPICAL

## 2019-05-26 MED ORDER — SODIUM CHLORIDE 0.9% FLUSH: 3.0000 mL | INTRAVENOUS | Status: DC | PRN

## 2019-05-26 MED ORDER — OXYCODONE HCL 5 MG PO TABS: 5.0000 mg | ORAL_TABLET | ORAL | Status: DC | PRN

## 2019-05-26 MED ORDER — IODIXANOL 320 MG/ML IV SOLN
INTRAVENOUS | Status: DC | PRN
  Administered 2019-05-26: 40 mL via INTRA_ARTERIAL

## 2019-05-26 MED ORDER — ONDANSETRON HCL 4 MG/2ML IJ SOLN
INTRAMUSCULAR | Status: DC | PRN
  Administered 2019-05-26: 4 mg via INTRAVENOUS

## 2019-05-26 MED ORDER — SODIUM CHLORIDE 0.9% FLUSH
3.0000 mL | Freq: Two times a day (BID) | INTRAVENOUS | Status: DC
  Administered 2019-05-27 (×2): 3 mL via INTRAVENOUS

## 2019-05-26 MED ORDER — PROTAMINE SULFATE 10 MG/ML IV SOLN
INTRAVENOUS | Status: DC | PRN
  Administered 2019-05-26: 70 mg via INTRAVENOUS
  Administered 2019-05-26: 10 mg via INTRAVENOUS

## 2019-05-26 MED ORDER — EPHEDRINE 5 MG/ML INJ
INTRAVENOUS | Status: AC
  Filled 2019-05-26: qty 10

## 2019-05-26 MED ORDER — PRAVASTATIN SODIUM 40 MG PO TABS
80.0000 mg | ORAL_TABLET | ORAL | Status: DC
  Administered 2019-05-27: 80 mg via ORAL
  Filled 2019-05-26 (×2): qty 2

## 2019-05-26 MED ORDER — VANCOMYCIN HCL IN DEXTROSE 1-5 GM/200ML-% IV SOLN
1000.0000 mg | Freq: Once | INTRAVENOUS | Status: AC
  Administered 2019-05-26: 1000 mg via INTRAVENOUS
  Filled 2019-05-26: qty 200

## 2019-05-26 MED ORDER — PHENYLEPHRINE HCL-NACL 20-0.9 MG/250ML-% IV SOLN: 0.0000 ug/min | INTRAVENOUS | Status: DC

## 2019-05-26 MED ORDER — POTASSIUM CHLORIDE CRYS ER 20 MEQ PO TBCR: 80.0000 meq | EXTENDED_RELEASE_TABLET | Freq: Once | ORAL | Status: DC

## 2019-05-26 MED ORDER — POTASSIUM CHLORIDE CRYS ER 10 MEQ PO TBCR
40.0000 meq | EXTENDED_RELEASE_TABLET | Freq: Once | ORAL | Status: AC
  Administered 2019-05-26: 40 meq via ORAL
  Filled 2019-05-26 (×2): qty 4

## 2019-05-26 MED ORDER — HEPARIN SODIUM (PORCINE) 1000 UNIT/ML IJ SOLN
INTRAMUSCULAR | Status: DC | PRN
  Administered 2019-05-26: 8000 [IU] via INTRAVENOUS

## 2019-05-26 MED ORDER — PROPOFOL 10 MG/ML IV BOLUS
INTRAVENOUS | Status: DC | PRN
  Administered 2019-05-26: 20 mg via INTRAVENOUS

## 2019-05-26 MED ORDER — CHLORHEXIDINE GLUCONATE 0.12 % MT SOLN: 15.0000 mL | Freq: Once | OROMUCOSAL | Status: AC

## 2019-05-26 MED ORDER — SODIUM CHLORIDE 0.9 % IV SOLN
INTRAVENOUS | Status: AC
  Administered 2019-05-26: 50 mL/h via INTRAVENOUS

## 2019-05-26 MED ORDER — ACETAMINOPHEN 325 MG PO TABS: 650.0000 mg | ORAL_TABLET | Freq: Four times a day (QID) | ORAL | Status: DC | PRN

## 2019-05-26 MED ORDER — DEXMEDETOMIDINE HCL IN NACL 400 MCG/100ML IV SOLN
INTRAVENOUS | Status: DC | PRN
  Administered 2019-05-26: 77.5 ug via INTRAVENOUS

## 2019-05-26 MED ORDER — EPHEDRINE SULFATE-NACL 50-0.9 MG/10ML-% IV SOSY
PREFILLED_SYRINGE | INTRAVENOUS | Status: DC | PRN
  Administered 2019-05-26: 10 mg via INTRAVENOUS

## 2019-05-26 MED ORDER — PHENYLEPHRINE HCL-NACL 10-0.9 MG/250ML-% IV SOLN
INTRAVENOUS | Status: DC | PRN
  Administered 2019-05-26: 40 ug/min via INTRAVENOUS

## 2019-05-26 MED ORDER — CHLORHEXIDINE GLUCONATE 0.12 % MT SOLN
OROMUCOSAL | Status: AC
  Administered 2019-05-26: 15 mL via OROMUCOSAL
  Filled 2019-05-26: qty 15

## 2019-05-26 MED ORDER — SODIUM CHLORIDE 0.9 % IV SOLN
INTRAVENOUS | Status: DC | PRN
  Administered 2019-05-26: 2000 mL via INTRAMUSCULAR

## 2019-05-26 MED ORDER — LIDOCAINE HCL (PF) 1 % IJ SOLN
INTRAMUSCULAR | Status: AC
  Filled 2019-05-26: qty 30

## 2019-05-26 MED ORDER — CHLORHEXIDINE GLUCONATE 4 % EX LIQD: 60.0000 mL | Freq: Once | CUTANEOUS | Status: DC

## 2019-05-26 MED ORDER — CHLORHEXIDINE GLUCONATE 4 % EX LIQD: 30.0000 mL | CUTANEOUS | Status: DC

## 2019-05-26 MED ORDER — NITROGLYCERIN IN D5W 200-5 MCG/ML-% IV SOLN: 0.0000 ug/min | INTRAVENOUS | Status: DC

## 2019-05-26 MED ORDER — SODIUM CHLORIDE 0.9 % IV SOLN: 250.0000 mL | INTRAVENOUS | Status: DC | PRN

## 2019-05-26 MED ORDER — MORPHINE SULFATE (PF) 2 MG/ML IV SOLN: 1.0000 mg | INTRAVENOUS | Status: DC | PRN

## 2019-05-26 MED ORDER — ONDANSETRON HCL 4 MG/2ML IJ SOLN
4.0000 mg | Freq: Four times a day (QID) | INTRAMUSCULAR | Status: DC | PRN
  Administered 2019-05-27: 4 mg via INTRAVENOUS
  Filled 2019-05-26: qty 2

## 2019-05-26 MED ORDER — ENSURE ENLIVE PO LIQD
237.0000 mL | Freq: Two times a day (BID) | ORAL | Status: DC
  Administered 2019-05-27: 237 mL via ORAL

## 2019-05-26 MED ORDER — MIRTAZAPINE 15 MG PO TABS
7.5000 mg | ORAL_TABLET | Freq: Every day | ORAL | Status: DC
  Administered 2019-05-27: 7.5 mg via ORAL
  Filled 2019-05-26: qty 1

## 2019-05-26 MED ORDER — LACTATED RINGERS IV SOLN: INTRAVENOUS | Status: DC | PRN

## 2019-05-26 MED ORDER — SODIUM CHLORIDE 0.9 % IV SOLN
1.5000 g | Freq: Two times a day (BID) | INTRAVENOUS | Status: AC
  Administered 2019-05-26 – 2019-05-28 (×4): 1.5 g via INTRAVENOUS
  Filled 2019-05-26 (×5): qty 1.5

## 2019-05-26 MED ORDER — TRAMADOL HCL 50 MG PO TABS: 50.0000 mg | ORAL_TABLET | ORAL | Status: DC | PRN

## 2019-05-26 MED ORDER — PHENYLEPHRINE 40 MCG/ML (10ML) SYRINGE FOR IV PUSH (FOR BLOOD PRESSURE SUPPORT)
PREFILLED_SYRINGE | INTRAVENOUS | Status: DC | PRN
  Administered 2019-05-26: 100 ug via INTRAVENOUS

## 2019-05-26 SURGICAL SUPPLY — 86 items
ADH SKN CLS APL DERMABOND .7 (GAUZE/BANDAGES/DRESSINGS) ×4
APL PRP STRL LF DISP 70% ISPRP (MISCELLANEOUS) ×2
BAG DECANTER FOR FLEXI CONT (MISCELLANEOUS) IMPLANT
BAG SNAP BAND KOVER 36X36 (MISCELLANEOUS) ×3 IMPLANT
BLADE CLIPPER SURG (BLADE) IMPLANT
BLADE STERNUM SYSTEM 6 (BLADE) IMPLANT
BLADE SURG 10 STRL SS (BLADE) IMPLANT
CABLE ADAPT CONN TEMP 6FT (ADAPTER) ×3 IMPLANT
CANISTER SUCT 3000ML PPV (MISCELLANEOUS) IMPLANT
CATH DIAG EXPO 6F AL1 (CATHETERS) IMPLANT
CATH DIAG EXPO 6F VENT PIG 145 (CATHETERS) ×6 IMPLANT
CATH EXTERNAL FEMALE PUREWICK (CATHETERS) IMPLANT
CATH INFINITI 6F AL2 (CATHETERS) IMPLANT
CATH S G BIP PACING (CATHETERS) ×3 IMPLANT
CHLORAPREP W/TINT 26 (MISCELLANEOUS) ×3 IMPLANT
CLIP VESOCCLUDE MED 24/CT (CLIP) IMPLANT
CLIP VESOCCLUDE SM WIDE 24/CT (CLIP) IMPLANT
CLOSURE MYNX CONTROL 6F/7F (Vascular Products) ×1 IMPLANT
CNTNR URN SCR LID CUP LEK RST (MISCELLANEOUS) ×4 IMPLANT
CONT SPEC 4OZ STRL OR WHT (MISCELLANEOUS) ×6
COVER BACK TABLE 80X110 HD (DRAPES) IMPLANT
COVER WAND RF STERILE (DRAPES) ×3 IMPLANT
DECANTER SPIKE VIAL GLASS SM (MISCELLANEOUS) ×3 IMPLANT
DERMABOND ADVANCED (GAUZE/BANDAGES/DRESSINGS) ×2
DERMABOND ADVANCED .7 DNX12 (GAUZE/BANDAGES/DRESSINGS) ×2 IMPLANT
DEVICE CLOSURE PERCLS PRGLD 6F (VASCULAR PRODUCTS) ×4 IMPLANT
DRAPE INCISE IOBAN 66X45 STRL (DRAPES) IMPLANT
DRSG TEGADERM 4X4.75 (GAUZE/BANDAGES/DRESSINGS) ×4 IMPLANT
ELECT CAUTERY BLADE 6.4 (BLADE) IMPLANT
ELECT REM PT RETURN 9FT ADLT (ELECTROSURGICAL) ×6
ELECTRODE REM PT RTRN 9FT ADLT (ELECTROSURGICAL) ×4 IMPLANT
FELT TEFLON 6X6 (MISCELLANEOUS) ×2 IMPLANT
GAUZE SPONGE 4X4 12PLY STRL (GAUZE/BANDAGES/DRESSINGS) ×3 IMPLANT
GLOVE BIO SURGEON STRL SZ7.5 (GLOVE) IMPLANT
GLOVE BIO SURGEON STRL SZ8 (GLOVE) IMPLANT
GLOVE EUDERMIC 7 POWDERFREE (GLOVE) IMPLANT
GLOVE ORTHO TXT STRL SZ7.5 (GLOVE) IMPLANT
GOWN STRL REUS W/ TWL LRG LVL3 (GOWN DISPOSABLE) IMPLANT
GOWN STRL REUS W/ TWL XL LVL3 (GOWN DISPOSABLE) ×2 IMPLANT
GOWN STRL REUS W/TWL LRG LVL3 (GOWN DISPOSABLE)
GOWN STRL REUS W/TWL XL LVL3 (GOWN DISPOSABLE) ×3
GUIDEWIRE SAF TJ AMPL .035X180 (WIRE) ×3 IMPLANT
GUIDEWIRE SAFE TJ AMPLATZ EXST (WIRE) ×3 IMPLANT
INSERT FOGARTY SM (MISCELLANEOUS) IMPLANT
KIT BASIN OR (CUSTOM PROCEDURE TRAY) ×3 IMPLANT
KIT HEART LEFT (KITS) ×3 IMPLANT
KIT SUCTION CATH 14FR (SUCTIONS) IMPLANT
KIT TURNOVER KIT B (KITS) ×3 IMPLANT
LOOP VESSEL MAXI BLUE (MISCELLANEOUS) IMPLANT
LOOP VESSEL MINI RED (MISCELLANEOUS) IMPLANT
NS IRRIG 1000ML POUR BTL (IV SOLUTION) ×3 IMPLANT
PACK ENDO MINOR (CUSTOM PROCEDURE TRAY) ×3 IMPLANT
PAD ARMBOARD 7.5X6 YLW CONV (MISCELLANEOUS) ×6 IMPLANT
PAD ELECT DEFIB RADIOL ZOLL (MISCELLANEOUS) ×3 IMPLANT
PENCIL BUTTON HOLSTER BLD 10FT (ELECTRODE) IMPLANT
PERCLOSE PROGLIDE 6F (VASCULAR PRODUCTS) ×6
POSITIONER HEAD DONUT 9IN (MISCELLANEOUS) ×3 IMPLANT
SET MICROPUNCTURE 5F STIFF (MISCELLANEOUS) ×3 IMPLANT
SHEATH BRITE TIP 7FR 35CM (SHEATH) ×3 IMPLANT
SHEATH PINNACLE 6F 10CM (SHEATH) ×3 IMPLANT
SHEATH PINNACLE 8F 10CM (SHEATH) ×3 IMPLANT
SLEEVE REPOSITIONING LENGTH 30 (MISCELLANEOUS) ×3 IMPLANT
SPONGE LAP 18X18 RF (DISPOSABLE) ×1 IMPLANT
STOPCOCK MORSE 400PSI 3WAY (MISCELLANEOUS) ×6 IMPLANT
SUT ETHIBOND X763 2 0 SH 1 (SUTURE) IMPLANT
SUT GORETEX CV 4 TH 22 36 (SUTURE) IMPLANT
SUT GORETEX CV4 TH-18 (SUTURE) IMPLANT
SUT MNCRL AB 3-0 PS2 18 (SUTURE) IMPLANT
SUT PROLENE 5 0 C 1 36 (SUTURE) IMPLANT
SUT PROLENE 6 0 C 1 30 (SUTURE) IMPLANT
SUT SILK  1 MH (SUTURE) ×3
SUT SILK 1 MH (SUTURE) ×2 IMPLANT
SUT VIC AB 2-0 CT1 27 (SUTURE)
SUT VIC AB 2-0 CT1 TAPERPNT 27 (SUTURE) IMPLANT
SUT VIC AB 2-0 CTX 36 (SUTURE) IMPLANT
SUT VIC AB 3-0 SH 8-18 (SUTURE) IMPLANT
SYR 50ML LL SCALE MARK (SYRINGE) ×3 IMPLANT
SYR BULB IRRIGATION 50ML (SYRINGE) IMPLANT
SYR MEDRAD MARK V 150ML (SYRINGE) ×3 IMPLANT
TOWEL GREEN STERILE (TOWEL DISPOSABLE) ×6 IMPLANT
TRANSDUCER W/STOPCOCK (MISCELLANEOUS) ×6 IMPLANT
TRAY FOLEY SLVR 14FR TEMP STAT (SET/KITS/TRAYS/PACK) IMPLANT
TUBE SUCT INTRACARD DLP 20F (MISCELLANEOUS) IMPLANT
VALVE HEART TRANSCATH SZ3 29MM (Valve) ×1 IMPLANT
WIRE EMERALD 3MM-J .035X150CM (WIRE) ×3 IMPLANT
WIRE EMERALD 3MM-J .035X260CM (WIRE) ×3 IMPLANT

## 2019-05-26 NOTE — Progress Notes (Signed)
  Echocardiogram 2D Echocardiogram has been performed.  [name] 05/26/2019, 1:01 PM

## 2019-05-26 NOTE — Anesthesia Procedure Notes (Signed)
Arterial Line Insertion Start/End3/11/2019 9:45 AM, 05/26/2019 10:00 AM Performed by: Leonor Liv, CRNA, CRNA  Patient location: Pre-op. Preanesthetic checklist: patient identified, IV checked, site marked, risks and benefits discussed, surgical consent, monitors and equipment checked, pre-op evaluation, timeout performed and anesthesia consent Right, radial was placed Catheter size: 20 G Hand hygiene performed  and maximum sterile barriers used  Allen's test indicative of satisfactory collateral circulation Attempts: 1 Procedure performed without using ultrasound guided technique. Following insertion, dressing applied and Biopatch. Post procedure assessment: normal

## 2019-05-26 NOTE — Op Note (Signed)
[facility]   MULTIDISCIPLINARY HEART VALVE TEAM   TAVR OPERATIVE NOTE   Date of Procedure:  05/26/2019  Preoperative Diagnosis: Severe Low-Flow, Low-Gradient Aortic Stenosis   Postoperative Diagnosis: Same   Procedure:    Transcatheter Aortic Valve Replacement - Percutaneous Right Transfemoral Approach  Edwards Sapien 3 Ultra THV (size 29 mm, model # 9600TFX, serial # [id number])   Co-Surgeons:  Valentina Gu. Roxy Manns, MD and Sherren Mocha, MD  Anesthesiologist:  Lillia Abed, MD  Echocardiographer:  Sanda Klein, MD  Pre-operative Echo Findings:  Severe aortic stenosis  Severe left ventricular systolic dysfunction  Post-operative Echo Findings:  No paravalvular leak  Unchanged left ventricular systolic function   BRIEF CLINICAL NOTE AND INDICATIONS FOR SURGERY  Patient is an 86 year old male with complex past medical history who has been referred for surgical consultation to discuss treatment options for management of suspected low flow low gradient severe aortic stenosis.  Patient's cardiac history dates back to 2004 when he underwent mitral valve repair and coronary artery bypass grafting x1 by Dr. Servando Snare.  By report the patient had mitral valve prolapse and underwent quadrangular resection of the posterior leaflet with artificial Gore-Tex neocords placement and ring annuloplasty using a 28 mm Seguin semirigid ring.  At the time he underwent coronary artery bypass grafting using a reverse greater saphenous vein graft to the obtuse marginal branch of the left circumflex.  The patient states that he recovered from his surgery uneventfully.  The patient developed persistent atrial fibrillation for which she has remained on long-term anticoagulation, initially using warfarin and more recently using Eliquis.  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 Radford Pax.  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  Patient was hospitalized in October 2019 with acute exacerbation of shortness of breath and congestive heart failure.  He required intensive inpatient diuresis with readjustment of his medications, and since that time he has been followed regularly by Dr. Harrell Gave.  The patient has remained in longstanding persistent atrial fibrillation and both heart rate and symptoms of congestive heart failure have been difficult to control.  Previous echocardiograms have revealed what was felt to be mild aortic stenosis, but more recent echocardiograms have suggested significant progression with the possibility of severe low-flow low gradient aortic stenosis.  Most recent transthoracic echocardiogram performed January 13, 2019 revealed severe global left ventricular systolic dysfunction with ejection fraction estimated only 20 to 25% in the setting of possibly severe mitral regurgitation.  There was moderate to severe left ventricular chamber enlargement but no significant left ventricular hypertrophy.  The aortic valve leaflets appeared severely calcified.  Peak velocity across aortic valve measured 2.3 m/s corresponding to mean transvalvular gradient estimated 12.2 mmHg but the aortic valve area was notably estimated only 0.77 cm with DVI reported 0.18 and stroke-volume index reported 29.16.  Patient was referred to the multidisciplinary heart valve clinic and has been evaluated previously by Dr. Burt Knack.  Diagnostic cardiac catheterization was performed April 28, 2019.  Patient was found to have a mild coronary artery disease with chronic occlusion of the first obtuse marginal branch of the left circumflex but continued patency of the vein graft to the same branch.  Otherwise there was mild diffuse disease in the distal right coronary artery and  nonobstructive disease in the left coronary system.  Catheterization confirmed presence of aortic stenosis with mean transvalvular gradient measured 33 mmHg at catheterization corresponding to aortic valve area calculated 0.8 cm.  There was moderate to severe pulmonary hypertension with PA pressures measured 54/17 despite relatively low systemic arterial pressure.  There were large V waves on pulmonary capillary wedge pressure consistent with severe mitral regurgitation.  Mean central venous pressure was 11 mmHg.  CT angiography was performed and the patient referred for surgical consultation  During the course of the patient's preoperative work up they have been evaluated comprehensively by a multidisciplinary team of specialists coordinated through the [facility] in the [facility].  They have been demonstrated to suffer from symptomatic severe low flow, low gradient aortic stenosis as noted above. The patient has been counseled extensively as to the relative risks and benefits of all options for the treatment of severe aortic stenosis including long term medical therapy, conventional surgery for aortic valve replacement, and transcatheter aortic valve replacement.  All questions have been answered, and the patient provides full informed consent for the operation as described.   DETAILS OF THE OPERATIVE PROCEDURE  PREPARATION:    The patient is brought to the operating room on the above mentioned date and appropriate monitoring was established by the anesthesia team. The patient is placed in the supine position on the operating table.  Intravenous antibiotics are administered. The patient is monitored closely throughout the procedure under conscious sedation.    Baseline transthoracic echocardiogram was performed. The patient's chest, abdomen, both groins, and both lower extremities are prepared and draped in a sterile manner. A time out procedure is  performed.   PERIPHERAL ACCESS:    Using the modified Seldinger technique, femoral arterial and venous access was obtained with placement of 6 Fr sheaths on the left side.  A pigtail diagnostic catheter was passed through the left arterial sheath under fluoroscopic guidance into the aortic root.  A temporary transvenous pacemaker catheter was passed through the left femoral venous sheath under fluoroscopic guidance into the right ventricle.  The pacemaker was tested to ensure stable lead placement and pacemaker capture. Aortic root angiography was performed in order to determine the optimal angiographic angle for valve deployment.   TRANSFEMORAL ACCESS:   Percutaneous transfemoral access and sheath placement was performed using ultrasound guidance.  The right common femoral artery was cannulated using a micropuncture needle and appropriate location was verified using hand injection angiogram.  A pair of Abbott Perclose percutaneous closure devices were placed and a 6 French sheath replaced into the femoral artery.  The patient was heparinized systemically and ACT verified > 250 seconds.    A 16 Fr transfemoral E-sheath was introduced into the right common femoral artery after progressively dilating over an Amplatz superstiff wire. An AL-2 catheter was used to direct a straight-tip exchange length wire across the native aortic valve into the left ventricle. This was exchanged out for a pigtail catheter and position was confirmed in the LV apex. Simultaneous LV and Ao pressures were recorded.  The pigtail catheter was exchanged for an Amplatz Extra-stiff wire in the LV apex.  Echocardiography was utilized to confirm appropriate wire position and no sign of entanglement in the mitral subvalvular apparatus.   TRANSCATHETER HEART VALVE DEPLOYMENT:   An Edwards Sapien 3 Ultra transcatheter heart valve (size 29 mm, model #9600TFX, serial #[id number]) was prepared and crimped per manufacturer's guidelines, and  the proper orientation of the valve is confirmed on the [employer]  delivery system. The valve was advanced through the introducer sheath using normal technique until in an appropriate position in the abdominal aorta beyond the sheath tip. The balloon was then retracted and using the fine-tuning wheel was centered on the valve. The valve was then advanced across the aortic arch using appropriate flexion of the catheter. The valve was carefully positioned across the aortic valve annulus. The Commander catheter was retracted using normal technique. Once final position of the valve has been confirmed by angiographic assessment, the valve is deployed while temporarily holding ventilation and during rapid ventricular pacing to maintain systolic blood pressure < 50 mmHg and pulse pressure < 10 mmHg. The balloon inflation is held for >3 seconds after reaching full deployment volume. Once the balloon has fully deflated the balloon is retracted into the ascending aorta and valve function is assessed using echocardiography. There is felt to be no paravalvular leak and no central aortic insufficiency.  The patient's hemodynamic recovery following valve deployment is good.  The deployment balloon and guidewire are both removed.    PROCEDURE COMPLETION:   The sheath was removed and femoral artery closure performed.  Protamine was administered once femoral arterial repair was complete. The temporary pacemaker, pigtail catheters and femoral sheaths were removed with manual pressure used for hemostasis.  A Mynx femoral closure device was utilized following removal of the diagnostic sheath in the left femoral artery.  The patient tolerated the procedure well and is transported to the surgical intensive care in stable condition. There were no immediate intraoperative complications. All sponge instrument and needle counts are verified correct at completion of the operation.   No blood products were administered during  the operation.  The patient received a total of 40 mL of intravenous contrast during the procedure.   Rexene Alberts, MD 05/26/2019 1:07 PM

## 2019-05-26 NOTE — Op Note (Signed)
HEART AND VASCULAR [facility]ENTER   MULTIDISCIPLINARY HEART VALVE TEAM   TAVR OPERATIVE NOTE   Date of Procedure:  05/26/2019  Preoperative Diagnosis: Severe Aortic Stenosis   Postoperative Diagnosis: Same   Procedure:    Transcatheter Aortic Valve Replacement - Percutaneous Transfemoral Approach  Edwards Sapien 3 THV (size 29 mm, model # 9600TFX, serial # [id number]) Co-Surgeons:  Valentina Gu. Roxy Manns, MD and Sherren Mocha, MD  Anesthesiologist:  Lillia Abed, MD  Echocardiographer:  Sanda Klein, MD  Pre-operative Echo Findings:  Severe aortic stenosis  Severe left ventricular systolic dysfunction  Moderate mitral regurgitation  Post-operative Echo Findings:  No paravalvular leak  Severe left ventricular systolic dysfunction  BRIEF CLINICAL NOTE AND INDICATIONS FOR SURGERY  86 year old gentleman with multiple medical problems presents today for TAVR.  His cardiac history dates back to 2004 when he underwent single-vessel CABG and mitral valve repair.  He has developed persistent atrial fibrillation with difficult ventricular rate control.  He has been on long-term oral anticoagulation, transitioning from warfarin to apixaban.  He has developed progressive combined systolic and diastolic heart failure with worsening aortic stenosis, now demonstrated to suffer from severe, stage D2, low-flow low gradient aortic stenosis.  LVEF is now 20 to 25%.  Mean transvalvular gradient is 12 mmHg but calculated aortic valve area 0.77 and aortic valve dimensionless index is 0.18 with a low stroke-volume index of 29.  Cardiac catheterization demonstrated nonobstructive CAD with the exception of an occluded obtuse marginal branch and patent vein graft to that territory.  Mean transaortic valve gradient was 33 mmHg corresponded to a calculated aortic valve area of 0.8 cm.  He was noted to have moderate to severe pulmonary hypertension and large V waves on his pulmonary capillary wedge tracing.  He  underwent multidisciplinary heart team review and was felt to be an appropriate, albeit high risk candidate for transfemoral TAVR.  During the course of the patient's preoperative work up they have been evaluated comprehensively by a multidisciplinary team of specialists coordinated through the [facility] in the [facility].  They have been demonstrated to suffer from symptomatic severe aortic stenosis as noted above. The patient has been counseled extensively as to the relative risks and benefits of all options for the treatment of severe aortic stenosis including long term medical therapy, conventional surgery for aortic valve replacement, and transcatheter aortic valve replacement.  The patient has been independently evaluated in formal cardiac surgical consultation by Dr Roxy Manns, who deemed the patient appropriate for TAVR. Based upon review of all of the patient's preoperative diagnostic tests they are felt to be candidate for transcatheter aortic valve replacement using the transfemoral approach as an alternative to conventional surgery.    Following the decision to proceed with transcatheter aortic valve replacement, a discussion has been held regarding what types of management strategies would be attempted intraoperatively in the event of life-threatening complications, including whether or not the patient would be considered a candidate for the use of cardiopulmonary bypass and/or conversion to open sternotomy for attempted surgical intervention.  The patient has been advised of a variety of complications that might develop peculiar to this approach including but not limited to risks of death, stroke, paravalvular leak, aortic dissection or other major vascular complications, aortic annulus rupture, device embolization, cardiac rupture or perforation, acute myocardial infarction, arrhythmia, heart block or bradycardia requiring permanent pacemaker  placement, congestive heart failure, respiratory failure, renal failure, pneumonia, infection, other late complications related to structural valve deterioration  or migration, or other complications that might ultimately cause a temporary or permanent loss of functional independence or other long term morbidity.  The patient provides full informed consent for the procedure as described and all questions were answered preoperatively.  DETAILS OF THE OPERATIVE PROCEDURE  PREPARATION:   The patient is brought to the operating room on the above mentioned date and central monitoring was established by the anesthesia team including placement of a central venous catheter and radial arterial line. The patient is placed in the supine position on the operating table.  Intravenous antibiotics are administered. The patient is monitored closely throughout the procedure under conscious sedation.  Baseline transthoracic echocardiogram is performed. The patient's chest, abdomen, both groins, and both lower extremities are prepared and draped in a sterile manner. A time out procedure is performed.   PERIPHERAL ACCESS:   Using ultrasound guidance, femoral arterial and venous access is obtained with placement of 6 Fr sheaths on the left side.  A pigtail diagnostic catheter was passed through the femoral arterial sheath under fluoroscopic guidance into the aortic root.  A temporary transvenous pacemaker catheter was passed through the femoral venous sheath under fluoroscopic guidance into the right ventricle.  The pacemaker was tested to ensure stable lead placement and pacemaker capture. Aortic root angiography was performed in order to determine the optimal angiographic angle for valve deployment.  TRANSFEMORAL ACCESS:  A micropuncture technique is used to access the right femoral artery under fluoroscopic and ultrasound guidance.  2 Perclose devices are deployed at 10' and 2' positions to 'PreClose' the femoral artery.  An 8 French sheath is placed and then an Amplatz Superstiff wire is advanced through the sheath. This is changed out for a 16 French transfemoral E-Sheath after progressively dilating over the Superstiff wire.  An AL-2 catheter was used to direct a straight-tip exchange length wire across the native aortic valve into the left ventricle. This was exchanged out for a pigtail catheter and position was confirmed in the LV apex. Simultaneous LV and Ao pressures were recorded.  The pigtail catheter was exchanged for an Amplatz Extra-stiff wire in the LV apex.    BALLOON AORTIC VALVULOPLASTY:  Not performed  TRANSCATHETER HEART VALVE DEPLOYMENT:  An Edwards Sapien 3 transcatheter heart valve (size 29 mm) was prepared and crimped per manufacturer's guidelines, and the proper orientation of the valve is confirmed on the [employer] delivery system. The valve was advanced through the introducer sheath using normal technique until in an appropriate position in the abdominal aorta beyond the sheath tip. The balloon was then retracted and using the fine-tuning wheel was centered on the valve. The valve was then advanced across the aortic arch using appropriate flexion of the catheter. The valve was carefully positioned across the aortic valve annulus. The Commander catheter was retracted using normal technique. Once final position of the valve has been confirmed by angiographic assessment, the valve is deployed while temporarily holding ventilation and during rapid ventricular pacing to maintain systolic blood pressure < 50 mmHg and pulse pressure < 10 mmHg. The balloon inflation is held for >3 seconds after reaching full deployment volume. Once the balloon has fully deflated the balloon is retracted into the ascending aorta and valve function is assessed using echocardiography. The patient's hemodynamic recovery following valve deployment is good.  The deployment balloon and guidewire are both removed. Echo  demostrated acceptable post-procedural gradients, stable mitral valve function, and no aortic insufficiency.    PROCEDURE COMPLETION:  The sheath was removed  and femoral artery closure is performed using the 2 previously deployed Perclose devices.  Protamine is administered once femoral arterial repair was complete. The site is clear with no evidence of bleeding or hematoma after the sutures are tightened and 20 minutes of manual pressure are applied. The temporary pacemaker and pigtail catheters are removed. Mynx closure is used for contralateral femoral arterial hemostasis for the 6 Fr sheath.  The patient tolerated the procedure well and is transported to the surgical intensive care in stable condition. There were no immediate intraoperative complications. All sponge instrument and needle counts are verified correct at completion of the operation.   The patient received a total of 30 mL of intravenous contrast during the procedure.   Sherren Mocha, MD 05/26/2019 1:58 PM

## 2019-05-26 NOTE — Anesthesia Postprocedure Evaluation (Signed)
Anesthesia Post Note  Patient: [name]  Procedure(s) Performed: TRANSCATHETER AORTIC VALVE REPLACEMENT, TRANSFEMORAL (N/A Chest) INTRAOPERATIVE TRANSTHORACIC ECHOCARDIOGRAM (N/A )     Patient location during evaluation: PACU Anesthesia Type: MAC Level of consciousness: awake and alert Pain management: pain level controlled Vital Signs Assessment: post-procedure vital signs reviewed and stable Respiratory status: spontaneous breathing, nonlabored ventilation, respiratory function stable and patient connected to nasal cannula oxygen Cardiovascular status: stable and blood pressure returned to baseline Postop Assessment: no apparent nausea or vomiting Anesthetic complications: no    Last Vitals:  Vitals:   05/26/19 1450 05/26/19 1600  BP: (!) 98/39 114/68  Pulse: 62 (!) 55  Resp: 15 14  Temp:    SpO2: 95% 99%    Last Pain:  Vitals:   05/26/19 1307  TempSrc:   PainSc: 0-No pain                 Kristina Mcnorton DAVID

## 2019-05-26 NOTE — Progress Notes (Signed)
Started to eat dinner, then all of sudden tstarted throwing up 150 ml bile with partially digested food.  Held head straight, up  Bed in reverse tburg position. Legs kept straight. Suctioned patient with yaunker. No coughing after lungs remain clear.

## 2019-05-26 NOTE — Transfer of Care (Signed)
Immediate Anesthesia Transfer of Care Note  Patient: [name]  Procedure(s) Performed: TRANSCATHETER AORTIC VALVE REPLACEMENT, TRANSFEMORAL (N/A Chest) INTRAOPERATIVE TRANSTHORACIC ECHOCARDIOGRAM (N/A )  Patient Location: PACU  Anesthesia Type:MAC  Level of Consciousness: awake, alert  and oriented  Airway & Oxygen Therapy: Patient Spontanous Breathing and Patient connected to nasal cannula oxygen  Post-op Assessment: Report given to RN, Post -op Vital signs reviewed and stable and Patient moving all extremities  Post vital signs: Reviewed and stable  Last Vitals:  Vitals Value Taken Time  BP 114/55 05/26/19 1312  Temp    Pulse 63 05/26/19 1314  Resp 19 05/26/19 1314  SpO2 97 % 05/26/19 1314  Vitals shown include unvalidated device data.  Last Pain:  Vitals:   05/26/19 1307  TempSrc:   PainSc: 0-No pain         Complications: No apparent anesthesia complications

## 2019-05-26 NOTE — Progress Notes (Signed)
Patient and wife stated they have lost weight partially due to loss of apatite and change in eating habits, but also patient has lost wieght due to taking of diuretic.

## 2019-05-26 NOTE — Interval H&P Note (Signed)
History and Physical Interval Note:  05/26/2019 2:36 PM  [name]  has presented today for surgery, with the diagnosis of Severe Aortic Stenosis.  The various methods of treatment have been discussed with the patient and family. After consideration of risks, benefits and other options for treatment, the patient has consented to  Procedure(s): TRANSCATHETER AORTIC VALVE REPLACEMENT, TRANSFEMORAL (N/A) INTRAOPERATIVE TRANSTHORACIC ECHOCARDIOGRAM (N/A) as a surgical intervention.  The patient's history has been reviewed, patient examined, no change in status, stable for surgery.  I have reviewed the patient's chart and labs.  Questions were answered to the patient's satisfaction.     Sherren Mocha

## 2019-05-26 NOTE — Anesthesia Procedure Notes (Addendum)
Procedure Name: MAC Date/Time: 05/26/2019 11:15 AM Performed by: Leonor Liv, CRNA Pre-anesthesia Checklist: Patient identified, Emergency Drugs available, Suction available, Patient being monitored and Timeout performed Patient Re-evaluated:Patient Re-evaluated prior to induction Oxygen Delivery Method: Simple face mask Preoxygenation: Pre-oxygenation with 100% oxygen Placement Confirmation: positive ETCO2 Dental Injury: Teeth and Oropharynx as per pre-operative assessment  Comments: MAC case with local injected. Simple facemask with end-tidal monitoring. No oral airway utilized. No device in mouth. Dental exam remains same as pre-procedure evaluation. Martinique Ingram SRNA

## 2019-05-26 NOTE — Progress Notes (Addendum)
  [place] VALVE TEAM  Patient doing well s/p TAVR. He is hemodynamically stable. Groin sites stable. ECG with chronic afib and no high grade block. Will send to [place]. Potassium low. Will supplement now with 80 mEq Kdur. Plan for early ambulation after bedrest completed and hopeful discharge over the next 48 hours.   Angelena Form PA-C  [facility]  Pager [phone]

## 2019-05-26 NOTE — Plan of Care (Signed)
TAVR patient stated color improved already.  Plan discharge home on 3/11- no home needs

## 2019-05-27 ENCOUNTER — Encounter: Payer: Self-pay | Admitting: *Deleted

## 2019-05-27 ENCOUNTER — Inpatient Hospital Stay (HOSPITAL_COMMUNITY): Payer: PPO

## 2019-05-27 DIAGNOSIS — Z952 Presence of prosthetic heart valve: Secondary | ICD-10-CM

## 2019-05-27 DIAGNOSIS — I5043 Acute on chronic combined systolic (congestive) and diastolic (congestive) heart failure: Secondary | ICD-10-CM

## 2019-05-27 LAB — CBC
HCT: 37.5 % — ABNORMAL LOW (ref 39.0–52.0)
Hemoglobin: 12.3 g/dL — ABNORMAL LOW (ref 13.0–17.0)
MCH: 31.1 pg (ref 26.0–34.0)
MCHC: 32.8 g/dL (ref 30.0–36.0)
MCV: 94.9 fL (ref 80.0–100.0)
Platelets: 53 10*3/uL — ABNORMAL LOW (ref 150–400)
RBC: 3.95 MIL/uL — ABNORMAL LOW (ref 4.22–5.81)
RDW: 16.3 % — ABNORMAL HIGH (ref 11.5–15.5)
WBC: 5.5 10*3/uL (ref 4.0–10.5)
nRBC: 0 % (ref 0.0–0.2)

## 2019-05-27 LAB — BASIC METABOLIC PANEL
Anion gap: 14 (ref 5–15)
BUN: 37 mg/dL — ABNORMAL HIGH (ref 8–23)
CO2: 25 mmol/L (ref 22–32)
Calcium: 9.3 mg/dL (ref 8.9–10.3)
Chloride: 103 mmol/L (ref 98–111)
Creatinine, Ser: 2.08 mg/dL — ABNORMAL HIGH (ref 0.61–1.24)
GFR calc Af Amer: 33 mL/min — ABNORMAL LOW (ref >60)
GFR calc non Af Amer: 28 mL/min — ABNORMAL LOW (ref >60)
Glucose, Bld: 115 mg/dL — ABNORMAL HIGH (ref 70–99)
Potassium: 3.8 mmol/L (ref 3.5–5.1)
Sodium: 142 mmol/L (ref 135–145)

## 2019-05-27 LAB — ECHOCARDIOGRAM LIMITED
Height: 69 in
Weight: 2668.8 oz

## 2019-05-27 LAB — MAGNESIUM: Magnesium: 1.7 mg/dL (ref 1.7–2.4)

## 2019-05-27 MED ORDER — APIXABAN 2.5 MG PO TABS
2.5000 mg | ORAL_TABLET | Freq: Two times a day (BID) | ORAL | Status: DC
  Administered 2019-05-27 – 2019-05-28 (×2): 2.5 mg via ORAL
  Filled 2019-05-27 (×2): qty 1

## 2019-05-27 MED ORDER — TORSEMIDE 20 MG PO TABS
40.0000 mg | ORAL_TABLET | Freq: Two times a day (BID) | ORAL | Status: DC
  Administered 2019-05-27: 40 mg via ORAL
  Filled 2019-05-27: qty 2

## 2019-05-27 MED ORDER — METOPROLOL SUCCINATE ER 50 MG PO TB24
50.0000 mg | ORAL_TABLET | Freq: Every day | ORAL | Status: DC
  Administered 2019-05-27 – 2019-05-28 (×2): 50 mg via ORAL
  Filled 2019-05-27 (×2): qty 1

## 2019-05-27 MED ORDER — POTASSIUM CHLORIDE CRYS ER 20 MEQ PO TBCR
20.0000 meq | EXTENDED_RELEASE_TABLET | Freq: Two times a day (BID) | ORAL | Status: DC
  Administered 2019-05-27 – 2019-05-28 (×3): 20 meq via ORAL
  Filled 2019-05-27 (×3): qty 1

## 2019-05-27 MED ORDER — METOPROLOL TARTRATE 5 MG/5ML IV SOLN: 5.0000 mg | Freq: Once | INTRAVENOUS | Status: AC

## 2019-05-27 MED ORDER — METOPROLOL TARTRATE 5 MG/5ML IV SOLN
INTRAVENOUS | Status: AC
  Administered 2019-05-27: 5 mg via INTRAVENOUS
  Filled 2019-05-27: qty 5

## 2019-05-27 MED FILL — Potassium Chloride Inj 2 mEq/ML: INTRAVENOUS | Qty: 40 | Status: AC

## 2019-05-27 MED FILL — Magnesium Sulfate Inj 50%: INTRAMUSCULAR | Qty: 10 | Status: AC

## 2019-05-27 MED FILL — Heparin Sodium (Porcine) Inj 1000 Unit/ML: INTRAMUSCULAR | Qty: 30 | Status: AC

## 2019-05-27 NOTE — Progress Notes (Signed)
Initial Nutrition Assessment  DOCUMENTATION CODES:   Non-severe (moderate) malnutrition in context of chronic illness  INTERVENTION:    Ensure Enlive po BID, each supplement provides 350 kcal and 20 grams of protein  MVI daily   NUTRITION DIAGNOSIS:   Moderate Malnutrition related to chronic illness(CHF/CKD) as evidenced by mild fat depletion, moderate muscle depletion.  GOAL:   Patient will meet greater than or equal to 90% of their needs  MONITOR:   PO intake, Supplement acceptance, Weight trends, Labs, I & O's  REASON FOR ASSESSMENT:   Malnutrition Screening Tool    ASSESSMENT:   Patient with PMH significant for CAD s/p CABG, CHF, CKD III, essential HTN, s/p MVR, HLD, and TIA. Presents this admission with aortic stenosis and regurgitation.   3/9- s/p TAVR  Pt denies loss of appetite PTA. Typically eats two meals daily that consist of B- eggs, cereal and D- meat, vegetable, protein. Used to drink two Ensures daily but was told by PCP he should decrease to one as two is "too much protein." Meal completions charted as 100% for his last two meals. Discussed the importance of protein intake to promote post-op healing. Wants to continues Ensure.   Endorses a UBW of 170 lb and denies recent weight loss. Records indicate pt weighed 180 lb on 04/08/19 and 167 lb this admission. Suspect pt has lost dry weight but unable to determine how much given history of CHF.   I/O: +1,424 since admit  UOP: 750 ml x 24 hrs   Medications: remeron, 20 mEq KCl BID, demadex Labs: Cr 2.08-trending up  CBG 108-115  NUTRITION - FOCUSED PHYSICAL EXAM:    Most Recent Value  Orbital Region  Mild depletion  Upper Arm Region  Moderate depletion  Thoracic and Lumbar Region  Unable to assess  Buccal Region  Mild depletion  Temple Region  Moderate depletion  Clavicle Bone Region  Moderate depletion  Clavicle and Acromion Bone Region  Moderate depletion  Scapular Bone Region  Unable to assess   Dorsal Hand  Mild depletion  Patellar Region  Moderate depletion  Anterior Thigh Region  Moderate depletion  Posterior Calf Region  Moderate depletion  Edema (RD Assessment)  Mild  Hair  Reviewed  Eyes  Reviewed  Mouth  Reviewed  Skin  Reviewed  Nails  Reviewed     Diet Order:   Diet Order            Diet Heart Room service appropriate? Yes; Fluid consistency: Thin  Diet effective now              EDUCATION NEEDS:   Education needs have been addressed  Skin:  Skin Assessment: Skin Integrity Issues: Skin Integrity Issues:: Incisions Incisions: R/L groin  Last BM:  PTA  Height:   Ht Readings from Last 1 Encounters:  05/26/19 5\' 9"  (1.753 m)    Weight:   Wt Readings from Last 1 Encounters:  05/27/19 75.7 kg    BMI:  Body mass index is 24.63 kg/m.  Estimated Nutritional Needs:   Kcal:  2250-2450 kcal  Protein:  110-125 grams  Fluid:  >/= 2 L/day   Mariana Single RD, LDN Clinical Nutrition Pager listed in [place]

## 2019-05-27 NOTE — Progress Notes (Signed)
The patient went into rapid a-fib in the 130s. Plan was to restart home lopressor at noon. Dose was given early.  PA was paged. While awaiting return call, Dr. Burt Knack rounded on patient and gave RN order for 5mg  lopressor IV. HR improved to 90s- low 100s after IV lopressor. Will continue to monitor closely.

## 2019-05-27 NOTE — Progress Notes (Signed)
  Echocardiogram 2D Echocardiogram limited has been performed.  Darlina Sicilian M 05/27/2019, 9:00 AM

## 2019-05-27 NOTE — Progress Notes (Signed)
ANTICOAGULATION CONSULT NOTE - Initial Consult  Pharmacy Consult for apixaban Indication: atrial fibrillation  Allergies  Allergen Reactions  . Asa [Aspirin] Other (See Comments)    Bleeding- has internal hemorrhoids  . Flomax [Tamsulosin] Palpitations and Other (See Comments)    Made his heart go out of rhythm  . Pirfenidone     Weight loss, loss of appetite.    Patient Measurements: Height: 5\' 9"  (175.3 cm) Weight: 166 lb 12.8 oz (75.7 kg) IBW/kg (Calculated) : 70.7  Vital Signs: Temp: 98.7 F (37.1 C) (03/10 1159) Temp Source: Oral (03/10 1159) BP: 115/67 (03/10 1200) Pulse Rate: 96 (03/10 1200)  Labs: Recent Labs    05/26/19 1228 05/26/19 1228 05/26/19 1352 05/27/19 0242  HGB 12.2*   < > 14.6 12.3*  HCT 36.0*  --  43.0 37.5*  PLT  --   --   --  53*  CREATININE 1.80*  --  1.80* 2.08*   < > = values in this interval not displayed.    Estimated Creatinine Clearance: 26 mL/min (A) (by C-G formula based on SCr of 2.08 mg/dL (H)).   Medical History: Past Medical History:  Diagnosis Date  . Adenomatous colon polyp   . CAD (coronary artery disease)    a. S/P CABG x 1 @ time of MV annuloplasty;  b. 02/2010 ETT: neg for ischemia.  . Carotid artery stenosis    1-39% by dopplers 10/2016  . Chronic combined systolic (congestive) and diastolic (congestive) heart failure ([facility])   . CKD (chronic kidney disease), stage III   . Current use of long term anticoagulation    a. Coumadin in setting of afib.  . Diverticulosis   . Dyslipidemia   . Essential hypertension   . H/O mitral valve repair 09/16/2002   Complex valvuloplasty including quadrangular resection of posterior leaflet with artificial Gore-tex neochords and 28 mm Seguin ring annuloplasty - Dr Servando Snare  . Hemorrhoids   . Hyperlipidemia   . Hypertension   . Permanent atrial fibrillation (HCC)    a. CHA2DS2VASc = 6 -->chronic coumadin;  b. Prev on amio-->d/c 2/2 hypothyroidism. >> resumed 5/16. c. Notes from 2017  indicate interstitial lung disease by pulm appt, question amio toxicity, also increased liver attenuation. Rate control pursued and amiodarone discontinued.  . S/P CABG x 1 09/16/2002   SVG to OM  . 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 Sleep apnea 12/2018   per patient's spouse wears BIPAP "every other night or so"  . Thrombocytopenia ([place])   . TIA (transient ischemic attack)      Assessment: 86 year old male s/p TAVR this admission. Patient with history of afib on apixaban prior to admit.   He currently meets criteria for reduced dose apixaban, 2.5mg  bid d/t age>80 and scr ~1.8-2 which appears to be his baseline. Hgb appears stable post-op.   Goal of Therapy:  Monitor platelets by anticoagulation protocol: Yes   Plan:  Apixaban 2.5mg  bid Follow up cbc in am  Will communicate dose change with structural team  Erin Hearing PharmD., BCPS Clinical Pharmacist 05/27/2019 1:07 PM

## 2019-05-27 NOTE — Progress Notes (Addendum)
[facility] VALVE TEAM  Patient Name: [name] Date of Encounter: 05/27/2019  Primary Cardiologist: Dr. Harrell Gave / Dr. Burt Knack & Dr. Roxy Manns (TAVR)  Hospital Problem List     Principal Problem:   S/P TAVR (transcatheter aortic valve replacement) Active Problems:   H/O mitral valve repair   Permanent atrial fibrillation   Acute on chronic combined systolic and diastolic CHF (congestive heart failure) (HCC)   Dyslipidemia   Chronic kidney disease (CKD), stage III (moderate)   Coronary artery disease involving native coronary artery of native heart without angina pectoris   Chronic anticoagulation   Carotid artery stenosis   Severe aortic stenosis   ILD (interstitial lung disease) (HCC)   S/P CABG x 1   Thrombocytopenia (HCC)   Sleep apnea     Subjective   Feeling great. No complaints.   Inpatient Medications    Scheduled Meds: . Chlorhexidine Gluconate Cloth  6 each Topical Daily  . feeding supplement (ENSURE ENLIVE)  237 mL Oral BID BM  . levothyroxine  75 mcg Oral QAC breakfast  . metoprolol succinate  50 mg Oral Daily  . mirtazapine  7.5 mg Oral QHS  . Potassium Chloride ER  20 mEq Oral BID  . pravastatin  80 mg Oral Q M,W,F  . sodium chloride flush  3 mL Intravenous Q12H  . torsemide  40 mg Oral BID   Continuous Infusions: . sodium chloride    . cefUROXime (ZINACEF)  IV Stopped (05/27/19 3762)  . nitroGLYCERIN    . phenylephrine (NEO-SYNEPHRINE) Adult infusion     PRN Meds: sodium chloride, acetaminophen **OR** acetaminophen, morphine injection, ondansetron (ZOFRAN) IV, oxyCODONE, sodium chloride flush, traMADol   Vital Signs    Vitals:   05/27/19 0600 05/27/19 0700 05/27/19 0800 05/27/19 0900  BP: (!) 102/53 116/65 (!) 113/58 (!) 113/58  Pulse: 84 (!) 104 95 98  Resp: (!) 25 (!) 23 20 (!) 21  Temp:      TempSrc:      SpO2: 100% 100% 95% 96%  Weight:      Height:        Intake/Output Summary (Last 24  hours) at 05/27/2019 1040 Last data filed at 05/27/2019 0900 Gross per 24 hour  Intake 2247.12 ml  Output 900 ml  Net 1347.12 ml   Filed Weights   05/26/19 0838 05/27/19 0500  Weight: 77.5 kg 75.7 kg    Physical Exam   GEN: Well nourished, well developed, in no acute distress. Appears much better than yesteday HEENT: Grossly normal.  Neck: Supple, no JVD, carotid bruits, or masses. Cardiac: RRR, soft flow murmur. No rubs, or gallops. No clubbing, cyanosis. 1+ bilateral LE edema  Respiratory:  Respirations regular and unlabored, clear to auscultation bilaterally. GI: Soft, nontender, nondistended, BS + x 4. MS: no deformity or atrophy. Skin: warm and dry, no rash.  Groin sites clear without hematoma or ecchymosis  Neuro:  Strength and sensation are intact. Psych: AAOx3.  Normal affect.  Labs    CBC Recent Labs    05/26/19 1352 05/27/19 0242  WBC  --  5.5  HGB 14.6 12.3*  HCT 43.0 37.5*  MCV  --  94.9  PLT  --  53*   Basic Metabolic Panel Recent Labs    05/26/19 1004 05/26/19 1125 05/26/19 1352 05/27/19 0242  NA 138   < > 141 142  K 2.8*   < > 2.9* 3.8  CL 100   < >  102 103  CO2 22  --   --  25  GLUCOSE 100*   < > 108* 115*  BUN 37*   < > 33* 37*  CREATININE 2.01*   < > 1.80* 2.08*  CALCIUM 9.1  --   --  9.3  MG  --   --   --  1.7   < > = values in this interval not displayed.   Liver Function Tests No results for input(s): AST, ALT, ALKPHOS, BILITOT, PROT, ALBUMIN in the last 72 hours. No results for input(s): LIPASE, AMYLASE in the last 72 hours. Cardiac Enzymes No results for input(s): CKTOTAL, CKMB, CKMBINDEX, TROPONINI in the last 72 hours. BNP Invalid input(s): POCBNP D-Dimer No results for input(s): DDIMER in the last 72 hours. Hemoglobin A1C No results for input(s): HGBA1C in the last 72 hours. Fasting Lipid Panel No results for input(s): CHOL, HDL, LDLCALC, TRIG, CHOLHDL, LDLDIRECT in the last 72 hours. Thyroid Function Tests No results for  input(s): TSH, T4TOTAL, T3FREE, THYROIDAB in the last 72 hours.  Invalid input(s): FREET3  Telemetry    afib with IVCD, HR 98- Personally Reviewed  ECG    afib with some RVR - Personally Reviewed  Radiology    ECHOCARDIOGRAM LIMITED  Result Date: 05/26/2019    ECHOCARDIOGRAM LIMITED REPORT   Patient Name:   [name] Date of Exam: 05/26/2019 Medical Rec #:  [id number]      Height:       69.0 in Accession #:    [id number]     Weight:       170.9 lb Date of Birth:  03/11/1932     BSA:          1.932 m Patient Age:    38 years       BP:           116/82 mmHg Patient Gender: M              HR:           88 bpm. Exam Location:  Inpatient Procedure: Limited Echo, Cardiac Doppler and Color Doppler Indications:     Aortic stenosis  History:         Patient has prior history of Echocardiogram examinations, most                  recent 05/20/2019. CHF, CAD, Prior CABG, TIA, Mitral Valve                  Disease, Aortic Valve Disease and MV repair, Arrythmias:Atrial                  Fibrillation; Risk Factors:Hypertension and Dyslipidemia.                  Aortic Valve: Edwards Medtronic, stented (TAVR) valve is                  present in the aortic position. Procedure Date: 05/26/2019.  Sonographer:     Dustin Flock Referring Phys:  [phone] Jazline Cumbee Diagnosing Phys: Sanda Klein, MD                   PRE-PROCEDURAL FINDINGS:                   Moderately dilated left ventricle with global hypokinesis and                  severely reduced systolic function. Estimated LVEF  20-25%.                  There are no regional wall motion abnormalities.                  Dilated right ventricle with severely depressed systolic                  function.                  Severe calcific aortic stenosis. Trileaflet aortic valve.                  Peak aortic valve gradient 31 mm Hg, mean gradient 19 mm Hg.                  Dimensionless obstructive index 0.18, calculated aortic valve                  area is 0.8  cm sq.                  Mild-moderate aortic insufficiency.                  No pericardial effusion.                   POST-PROCEDURAL FINDINGS:                  Moderately dilated left ventricle with global hypokinesis and                  severely reduced systolic function. Estimated LVEF 20-25%.                  There are no regional wall motion abnormalities.                  Dilated right ventricle with severely depressed systolic                  function.                  Well deployed 29 mm Edwards Sapien3 stent-valve                  Peak aortic valve gradient 3 mm Hg, mean gradient 2 mm Hg.                  Dimensionless obstructive index 0.74, calculated aortic valve                  area is 4.1 cm.                  There is no aortic insufficiency and no perivalvular leak.                  Moderate mitral insufficiency. S/P annuloplsaty repair and                  neo-chordae.                  No pericardial effusion. IMPRESSIONS  1. Left ventricular ejection fraction, by estimation, is 20 to 25%. The left ventricle has severely decreased function. The left ventricle demonstrates global hypokinesis. The left ventricular internal cavity size was moderately dilated.  2. Left atrial size was moderately dilated.  3. The mitral valve has been repaired/replaced. Mild to moderate mitral valve regurgitation.  4. The aortic valve has an indeterminant number of cusps. Aortic valve regurgitation is  not visualized. Severe aortic valve stenosis. There is a Acupuncturist, stented (TAVR) valve present in the aortic position. Procedure Date: 05/26/2019.  5. Tricuspid regurgitation signal is inadequate for assessing PA pressure. FINDINGS  Left Ventricle: Left ventricular ejection fraction, by estimation, is 20 to 25%. The left ventricle has severely decreased function. The left ventricle demonstrates global hypokinesis. The left ventricular internal cavity size was moderately dilated. Right Ventricle: Tricuspid  regurgitation signal is inadequate for assessing PA pressure. Left Atrium: Left atrial size was moderately dilated. Right Atrium: Right atrial size was not well visualized. Pericardium: There is no evidence of pericardial effusion. Mitral Valve: The mitral valve has been repaired/replaced. There is moderate thickening of the mitral valve leaflet(s). Mild to moderate mitral valve regurgitation. There is a prosthetic annuloplasty ring present in the mitral position. Tricuspid Valve: The tricuspid valve is normal in structure. Tricuspid valve regurgitation is not demonstrated. Aortic Valve: The aortic valve has an indeterminant number of cusps. . There is severe thickening and severe calcifcation of the aortic valve. Aortic valve regurgitation is not visualized. Severe aortic stenosis is present. There is severe thickening of the aortic valve. There is severe calcifcation of the aortic valve. Aortic valve mean gradient measures 19.0 mmHg. Aortic valve peak gradient measures 30.9 mmHg. Aortic valve area, by VTI measures 0.81 cm. There is a Acupuncturist, stented (TAVR) valve present in the aortic position. Procedure Date: 05/26/2019. Pulmonic Valve: The pulmonic valve was grossly normal. Pulmonic valve regurgitation is mild to moderate. Aorta: The aortic root is normal in size and structure.  LEFT VENTRICLE PLAX 2D LVOT diam:     2.50 cm LV SV:         48 LV SV Index:   25 LVOT Area:     4.91 cm  AORTIC VALVE AV Area (Vmax):    0.96 cm AV Area (Vmean):   0.86 cm AV Area (VTI):     0.81 cm AV Vmax:           278.00 cm/s AV Vmean:          210.000 cm/s AV VTI:            0.593 m AV Peak Grad:      30.9 mmHg AV Mean Grad:      19.0 mmHg LVOT Vmax:         54.50 cm/s LVOT Vmean:        36.933 cm/s LVOT VTI:          0.098 m LVOT/AV VTI ratio: 0.17  SHUNTS Systemic VTI:  0.10 m Systemic Diam: 2.50 cm Sanda Klein MD Electronically signed by Sanda Klein MD Signature Date/Time: 05/26/2019/1:15:30 PM    Final     Structural Heart Procedure  Result Date: 05/26/2019 See surgical note for result.   Cardiac Studies    TAVR OPERATIVE NOTE   Date of Procedure:                05/26/2019  Preoperative Diagnosis:      Severe Low-Flow, Low-Gradient Aortic Stenosis   Postoperative Diagnosis:    Same   Procedure:        Transcatheter Aortic Valve Replacement - Percutaneous Right Transfemoral Approach             Edwards Sapien 3 Ultra THV (size 29 mm, model # 9600TFX, serial # [id number])              Co-Surgeons:  Valentina Gu. Roxy Manns, MD and Sherren Mocha, MD  Anesthesiologist:                  Lillia Abed, MD  Echocardiographer:              Sanda Klein, MD  Pre-operative Echo Findings: ? Severe aortic stenosis ? Severe left ventricular systolic dysfunction  Post-operative Echo Findings: ? No paravalvular leak ? Unchanged left ventricular systolic function  ____________________   Echo 05/27/19: complete but pending formal read at the time of discharge    Patient Profile     Amit Leece is a 86 y.o. male with a history of TIA, CAD s/p remote CABG and mitral valve repair, permanent atrial fibrillation on Eliquis (hx of amiodarone toxicity), chronic combined S/D CHF, NICM, CKD stage IIIb, thrombocytopenia, mod-severe MR and severe LFLG AS who presented to [facility] on 05/26/19 for planned TAVR.  Assessment & Plan    Severe AS: s/p successful TAVR with a 29 mm Edwards Sapien 3 THV via the TF approach on 05/26/19. Post operative echo pending. Groin sites are stable. ECG with sinus and IVCD but no high grade heart block. Will resume home Eliquis tonight. Not on aspirin given a history of rectal bleeding on aspirin. Transfer to tele on 4E  Acute on chronic diastolic CHF: as evidenced by an elevated BNP (> 3000) on pre admission lab work and mild pleural effusions and cardiomegaly on CXR. This has been treated with TAVR. Torsemide was held prior to TAVR. Will start  him back on his home Torsemide 40mg  BID and see how he does.  Hypokalemia: this has been supplemented. Resume Kdur 20 Meq BID.   CKD stage IV: creat up to 2.08 (review of renal function shows creat is labile and this may be around his baseline). Will watch cloesly back on home diuretic  Chronic afib: resume home Toprol Xl now and Eliquis tonight  HTN: BP well controlled  Thrombocytopenia: platelets down to 52. Continue to monitor  CAD s/p CABG: pre TAVR cath showed nonobstructive CAD with mild diffuse stenosis of the LAD, left circumflex, and RCA. Total occlusion of the first OM branch of the circumflex with continued patency of the saphenous vein graft to OM. Continue medical therapy.  Pulmonary nodule: pre TAVR CT showed an enlarging right upper lobe pulmonary nodule measuring 9 x 5 mm (mean diameter of 7 mm), which previously had a mean diameter of only 4 mm on 12/09/2018. This nodule is concerning, but is currently likely below the level of PET-CT resolution. Close attention on repeat noncontrast chest CT is recommended in 3-6 months to reassess this lesion. I will have this arranged as an outpatient.  SignedAngelena Form, PA-C  05/27/2019, 10:40 AM  Pager [phone]  Patient seen, examined. Available data reviewed. Agree with findings, assessment, and plan as outlined by Nell Range, PA-C.  The patient is independently interviewed and examined.  He is alert, oriented, no distress.  Lungs are clear, heart is tachycardic and irregular with a soft systolic ejection murmur at the right upper sternal border.  Abdomen is soft and nontender.  Bilateral groin sites are clear.  There is trace bilateral pretibial edema.  The patient has permanent atrial fibrillation and this morning his heart rate is elevated.  He is otherwise doing very well.  His metoprolol succinate has been restarted and I will give him a single dose of IV metoprolol 5 mg.  His home medicines are otherwise restarted.  I  reviewed  his echocardiogram which shows stable but severe LV dysfunction with normal function of his transcatheter aortic valve and no pericardial effusion.  Plan to transfer him to telemetry and observe another 24 hours.  Sherren Mocha, M.D. 05/27/2019 12:08 PM

## 2019-05-27 NOTE — Progress Notes (Signed)
CARDIAC REHAB PHASE I   PRE:  Rate/Rhythm: 105 afib    BP: sitting 99/63    SaO2: 97 RA (just took 2L off)  MODE:  Ambulation: 370 ft   POST:  Rate/Rhythm: 123 afib    BP: sitting 110/82     SaO2: 96 RA  Pt in bed on O2, getting ready to tx. Able to get up independently and walk. Monitor not working and pulse ox not registering but no c/o. Once back in room HR 123 afib, SaO2 96 RA. Feels well. Encouraged more walking, will see tomorrow. [place], ACSM 05/27/2019 3:21 PM

## 2019-05-28 ENCOUNTER — Other Ambulatory Visit: Payer: Self-pay | Admitting: Physician Assistant

## 2019-05-28 DIAGNOSIS — N184 Chronic kidney disease, stage 4 (severe): Secondary | ICD-10-CM

## 2019-05-28 DIAGNOSIS — E440 Moderate protein-calorie malnutrition: Secondary | ICD-10-CM | POA: Insufficient documentation

## 2019-05-28 LAB — CBC
HCT: 38.4 % — ABNORMAL LOW (ref 39.0–52.0)
Hemoglobin: 12.6 g/dL — ABNORMAL LOW (ref 13.0–17.0)
MCH: 31.2 pg (ref 26.0–34.0)
MCHC: 32.8 g/dL (ref 30.0–36.0)
MCV: 95 fL (ref 80.0–100.0)
Platelets: 58 10*3/uL — ABNORMAL LOW (ref 150–400)
RBC: 4.04 MIL/uL — ABNORMAL LOW (ref 4.22–5.81)
RDW: 16.5 % — ABNORMAL HIGH (ref 11.5–15.5)
WBC: 7 10*3/uL (ref 4.0–10.5)
nRBC: 0 % (ref 0.0–0.2)

## 2019-05-28 LAB — BASIC METABOLIC PANEL
Anion gap: 12 (ref 5–15)
BUN: 42 mg/dL — ABNORMAL HIGH (ref 8–23)
CO2: 25 mmol/L (ref 22–32)
Calcium: 9.5 mg/dL (ref 8.9–10.3)
Chloride: 101 mmol/L (ref 98–111)
Creatinine, Ser: 2.66 mg/dL — ABNORMAL HIGH (ref 0.61–1.24)
GFR calc Af Amer: 24 mL/min — ABNORMAL LOW (ref >60)
GFR calc non Af Amer: 21 mL/min — ABNORMAL LOW (ref >60)
Glucose, Bld: 118 mg/dL — ABNORMAL HIGH (ref 70–99)
Potassium: 4.6 mmol/L (ref 3.5–5.1)
Sodium: 138 mmol/L (ref 135–145)

## 2019-05-28 MED ORDER — MIRTAZAPINE 15 MG PO TABS: 7.5000 mg | ORAL_TABLET | Freq: Every day | ORAL | Status: DC

## 2019-05-28 MED ORDER — POTASSIUM CHLORIDE ER 20 MEQ PO TBCR: 20.0000 meq | EXTENDED_RELEASE_TABLET | Freq: Every day | ORAL | 1 refills | Status: DC

## 2019-05-28 MED ORDER — TORSEMIDE 20 MG PO TABS: 40.0000 mg | ORAL_TABLET | Freq: Every day | ORAL | Status: DC

## 2019-05-28 MED ORDER — ENSURE ENLIVE PO LIQD: 237.0000 mL | Freq: Two times a day (BID) | ORAL | 12 refills | Status: DC

## 2019-05-28 NOTE — Progress Notes (Signed)
CARDIAC REHAB PHASE I   PRE:  Rate/Rhythm: 95 afib    BP: sitting 111/60    SaO2: 97 RA  MODE:  Ambulation: 450 ft   POST:  Rate/Rhythm: 128 afib    BP: sitting 117/69     SaO2: 98 RA  Tolerated well, no c/o. To recliner. Discussed walking, restrictions, daily wts, low sodium, and CRPII. Declined CRPII. He weighs daily and seems to watch his sodium.  [place], ACSM 05/28/2019 9:36 AM

## 2019-05-28 NOTE — Progress Notes (Signed)
MOBILITY TEAM - Progress Note   05/28/19 1000  Mobility  Activity Ambulated in room  Level of Assistance Independent  Assistive Device None  Mobility Response Tolerated well     Patient mobilizing throughout room well, independent with ADL tasks although increased SOB with dressing. Preparing to d/c home.   Mabeline Caras, PT, DPT Mobility Team Pager [phone]

## 2019-05-28 NOTE — Discharge Instructions (Signed)
Please HOLD your potassium and Torsemide until Saturday 05/30/19. Then please restart Torsemide and potassium at half your normal dose. 40mg  Torsemide once a day and 20 Meq KDur once a day. You will need lab work on Monday 06/01/19.   ACTIVITY AND EXERCISE . Daily activity and exercise are an important part of your recovery. People recover at different rates depending on their general health and type of valve procedure. . Most people recovering from TAVR feel better relatively quickly  . No lifting, pushing, pulling more than 10 pounds (examples to avoid: groceries, vacuuming, gardening, golfing):             - For one week with a procedure through the groin.             - For six weeks for procedures through the chest wall or neck. NOTE: You will typically see one of our providers 7-14 days after your procedure to discuss [place] the above activities.      DRIVING . Do not drive until you are seen for follow up and cleared by a provider. Generally, we ask patient to not drive for 1 week after their procedure. . If you have been told by your doctor in the past that you may not drive, you must talk with him/her before you begin driving again.   DRESSING . Groin site: you may leave the clear dressing over the site for up to one week or until it falls off.   HYGIENE . If you had a femoral (leg) procedure, you may take a shower when you return home. After the shower, pat the site dry. Do NOT use powder, oils or lotions in your groin area until the site has completely healed. . If you had a chest procedure, you may shower when you return home unless specifically instructed not to by your discharging practitioner.             - DO NOT scrub incision; pat dry with a towel.             - DO NOT apply any lotions, oils, powders to the incision.             - No tub baths / swimming for at least 2 weeks. . If you notice any fevers, chills, increased pain, swelling, bleeding or pus, please contact  your doctor.   ADDITIONAL INFORMATION . If you are going to have an upcoming dental procedure, please contact our office as you will require antibiotics ahead of time to prevent infection on your heart valve.    If you have any questions or concerns you can call the structural heart phone during normal business hours 8am-4pm. If you have an urgent need after hours or weekends please call [phone] to talk to the on call provider for general cardiology. If you have an emergency that requires immediate attention, please call 911.    After TAVR Checklist  Check  Test Description   Follow up appointment in 1-2 weeks  You will see our structural heart physician assistant, Nell Range. Your incision sites will be checked and you will be cleared to drive and resume all normal activities if you are doing well.     1 month echo and follow up  You will have an echo to check on your new heart valve and be seen back in the office by Nell Range. Many times the echo is not read by your appointment time, but Joellen Jersey will call you  later that day or the following day to report your results.   Follow up with your primary cardiologist You will need to be seen by your primary cardiologist in the following 3-6 months after your 1 month appointment in the valve clinic. Often times your Plavix or Aspirin will be discontinued during this time, but this is decided on a case by case basis.    1 year echo and follow up You will have another echo to check on your heart valve after 1 year and be seen back in the office by Nell Range. This your last structural heart visit.   Bacterial endocarditis prophylaxis  You will have to take antibiotics for the rest of your life before all dental procedures (even teeth cleanings) to protect your heart valve. Antibiotics are also required before some surgeries. Please check with your cardiologist before scheduling any surgeries. Also, please make sure to tell us if you have a  penicillin allergy as you will require an alternative antibiotic.

## 2019-05-28 NOTE — Progress Notes (Signed)
IV and telemetry discontinued at this time. CCMD notified. Discharge instructions reviewed with patient. All questions answered.  

## 2019-05-28 NOTE — Discharge Summary (Addendum)
[facility] TEAM  Discharge Summary    Patient ID: [name] MRN: [id number]; DOB: 1931/08/15  Admit date: 05/26/2019 Discharge date: 05/28/2019  Primary Care Provider: [name], MD  Primary Cardiologist: Buford Dresser, MD / Dr. Burt Knack & Dr. Roxy Manns (TAVR)  Discharge Diagnoses    Principal Problem:   S/P TAVR (transcatheter aortic valve replacement) Active Problems:   H/O mitral valve repair   Permanent atrial fibrillation   Acute on chronic combined systolic and diastolic CHF (congestive heart failure) (HCC)   Dyslipidemia   Chronic kidney disease (CKD), stage III (moderate)   Coronary artery disease involving native coronary artery of native heart without angina pectoris   Chronic anticoagulation   Carotid artery stenosis   Severe aortic stenosis   ILD (interstitial lung disease) (HCC)   S/P CABG x 1   Thrombocytopenia (HCC)   Sleep apnea   Malnutrition of moderate degree   Allergies Allergies  Allergen Reactions  . Asa [Aspirin] Other (See Comments)    Bleeding- has internal hemorrhoids  . Flomax [Tamsulosin] Palpitations and Other (See Comments)    Made his heart go out of rhythm  . Pirfenidone     Weight loss, loss of appetite.    Diagnostic Studies/Procedures     TAVR OPERATIVE NOTE   Date of Procedure:05/26/2019  Preoperative Diagnosis:SevereLow-Flow, Low-GradientAortic Stenosis   Postoperative Diagnosis:Same   Procedure:   Transcatheter Aortic Valve Replacement - PercutaneousRightTransfemoral Approach Edwards Sapien 3 Ultra THV (size 45mm, model # 9600TFX, serial [id number])  Co-Surgeons:Clarence H. Roxy Manns, MD and Sherren Mocha, MD  Anesthesiologist:Kevin Conrad Ogden, MD  Echocardiographer:Mihai Croitoru, MD  Pre-operative Echo Findings: ? Severe aortic  stenosis ? Severeleft ventricular systolic dysfunction  Post-operative Echo Findings: ? Noparavalvular leak ? Unchangedleft ventricular systolic function  ____________________   Echo 05/27/19: IMPRESSIONS  1. Left ventricular ejection fraction, by estimation, is 20 to 25%. The  left ventricle has severely decreased function. The left ventricle  demonstrates global hypokinesis. The left ventricular internal cavity size  was moderately dilated. Left ventricular diastolic function could not be evaluated. Left ventricular  diastolic function could not be evaluated.  2. Right ventricular systolic function is moderately reduced. The right  ventricular size is severely enlarged. There is moderately elevated  pulmonary artery systolic pressure.  3. Left atrial size was moderately dilated.  4. Right atrial size was moderately dilated.  5. The mitral valve has been repaired/replaced. Mild to moderate mitral  valve regurgitation. There is a 28 mm Seguin semirigid ring prosthetic  annuloplasty ring present in the mitral position. Procedure Date:  09/16/2002.  6. Tricuspid valve regurgitation is moderate.  7. The aortic valve has been repaired/replaced. Aortic valve  regurgitation is not visualized. There is a 29 mm Edwards Sapien  prosthetic (TAVR) valve present in the aortic position. Procedure Date:  05/26/2019. Echo findings are consistent with normal structure and function of the aortic valve prosthesis. Aortic valve area, by VTI measures 4.15 cm. Aortic valve mean gradient measures 4.3 mmHg.  Aortic valve Vmax measures 1.42 m/s.  8. The inferior vena cava is dilated in size with <50% respiratory  variability, suggesting right atrial pressure of 15 mmHg.    History of Present Illness     [name] is a 86 y.o. male with a history of TIA, CAD s/p remote CABG and mitral valve repair, permanent atrial fibrillation on Eliquis (hx of amiodarone toxicity), chronic combined  S/D CHF, NICM, CKD stage IIIb, thrombocytopenia, mod-severe MR  and severe LFLG AS who presented to [facility] on 05/26/19 for planned TAVR.  Patient's cardiac history dates back to 2004 when he underwent mitral valve repair and coronary artery bypass grafting x1 by Dr. Servando Snare.  By report the patient had mitral valve prolapse and underwent quadrangular resection of the posterior leaflet with artificial Gore-Tex neocords placement and ring annuloplasty using a 28 mm Seguin semirigid ring.  At the time he underwent coronary artery bypass grafting using a reverse greater saphenous vein graft to the obtuse marginal branch of the left circumflex.  The patient states that he recovered from his surgery uneventfully.  The patient developed persistent atrial fibrillation for which she has remained on long-term anticoagulation, initially using warfarin and more recently using Eliquis. 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 Radford Pax. 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  Patient was hospitalized in October 2019 with acute exacerbation of shortness of breath and congestive heart failure.  He required intensive inpatient diuresis with readjustment of his medications, and since that time he has been followed regularly by Dr. Harrell Gave. The patient has remained in longstanding persistent atrial fibrillation and both heart rate and symptoms of congestive heart failure have been difficult to control.  Previous echocardiograms have revealed what was felt to be mild aortic stenosis, but more recent echocardiograms have suggested significant progression with the possibility of severe low-flow low gradient aortic stenosis.  Most recent transthoracic echocardiogram performed January 13, 2019 revealed  severe global left ventricular systolic dysfunction with ejection fraction estimated only 20 to 25% in the setting of possibly severe mitral regurgitation.  There was moderate to severe left ventricular chamber enlargement but no significant left ventricular hypertrophy.  The aortic valve leaflets appeared severely calcified. Peak velocity across aortic valve measured 2.3 m/s corresponding to mean transvalvular gradient estimated 12.2 mmHg but the aortic valve area was notably estimated only 0.77 cm with DVI reported 0.18 and stroke-volume index reported 29.16.  Patient was referred to the multidisciplinary heart valve clinic and has been evaluated previously by Dr. Burt Knack.  Diagnostic cardiac catheterization was performed April 28, 2019.  Patient was found to have a mild coronary artery disease with chronic occlusion of the first obtuse marginal branch of the left circumflex but continued patency of the vein graft to the same branch.  Otherwise there was mild diffuse disease in the distal right coronary artery and nonobstructive disease in the left coronary system.  Catheterization confirmed presence of aortic stenosis with mean transvalvular gradient measured 33 mmHg at catheterization corresponding to aortic valve area calculated 0.8 cm.  There was moderate to severe pulmonary hypertension with PA pressures measured 54/17 despite relatively low systemic arterial pressure. There were large V waves on pulmonary capillary wedge pressure consistent with severe mitral regurgitation. Mean central venous pressure was 11 mmHg.  The patient has been evaluated by the multidisciplinary valve team and felt to have severe, symptomatic aortic stenosis and to be a suitable candidate for TAVR, which was set up for 05/26/19.    Hospital Course     Consultants: none  Severe AS:s/p successful TAVR with a 29 mm Edwards Sapien 3 THV via the TF approach on 05/26/19. Post operative echo showed EF 20-25%, normally functioning  TAVR with a mean gradient of 4.3 mm HG and no PVL. Groin sites are  stable. ECG with sinus and IVCD but no high grade heart block. Resumed on home Eliquis. Not on aspirin given a history of rectal bleeding on aspirin. Plan for DC home today with close follow up in the office next week.  Acute on chronic diastolic CHF: as evidenced by an elevated BNP (> 3000) on pre admission lab work and mild pleural effusions and cardiomegaly on CXR. This has been treated with TAVR. Torsemide was held prior to TAVR. He was started back on home dose of Torsemide 40mg  BID. Creat bumped up further to 2.66 and now back on hold. Plan to hold all diuretic and potassium at discharge and resume at 40mg  Torsemide daily on 3/13.  Hypokalemia: this has been supplemented. Resumed Kdur 20 Meq BID. Now K WNL. Will hold Kdur while holding diuretic and resume at 20 Meq daily on 3/13  CKD stage IV: creat up to 2.66 today (review of renal function shows creat baseline 1.6-2.0). Will hold diuretics and then resume at half the dose as above. Repeat BMET on Monday 3/15.  Chronic afib: resumed on home Toprol Xl now and Eliquis 2.5mg  BID  HTN: BP well controlled   Thrombocytopenia: platelets down to 52--> 58. Continue to monitor  CAD s/p CABG: pre TAVR cath showed nonobstructive CAD with mild diffuse stenosis of the LAD, left circumflex, and RCA. Total occlusion of the first OM branch of the circumflex with continued patency of the saphenous vein graft to OM. Continue medical therapy.  Pulmonary nodule: pre TAVR CT showed an enlarging right upper lobe pulmonary nodule measuring 9 x 5 mm (mean diameter of 7 mm), which previously had a mean diameter of only 4 mm on 12/09/2018. This nodule is concerning, but is currently likely below the level of PET-CT resolution. Close attention on repeat noncontrast chest CT is recommended in 3-6 months to reassess this lesion. I will have this arranged as an outpatient.  Protein calorie  malnutrition: seen by nutrition and Ensure Rx'd _____________  Discharge Vitals Blood pressure 94/64, pulse 99, temperature 97.7 F (36.5 C), temperature source Oral, resp. rate 16, height 5\' 9"  (1.753 m), weight 75.2 kg, SpO2 98 %.  Filed Weights   05/26/19 0838 05/27/19 0500 05/28/19 0433  Weight: 77.5 kg 75.7 kg 75.2 kg    GEN: Well nourished, well developed, in no acute distress HEENT: normal Neck: no JVD or masses Cardiac: irreg irreg; soft SEM @ RUSB. No rubs, or gallops. 1+ bilateral pretibial edema. Respiratory:  clear to auscultation bilaterally, normal work of breathing GI: soft, nontender, nondistended, + BS MS: no deformity or atrophy Skin: warm and dry, no rash.  Groin sites clear without hematoma or ecchymosis  Neuro:  Alert and Oriented x 3, Strength and sensation are intact Psych: euthymic mood, full affect    Labs & Radiologic Studies    CBC Recent Labs    05/27/19 0242 05/28/19 0303  WBC 5.5 7.0  HGB 12.3* 12.6*  HCT 37.5* 38.4*  MCV 94.9 95.0  PLT 53* 58*   Basic Metabolic Panel Recent Labs    05/27/19 0242 05/28/19 0303  NA 142 138  K 3.8 4.6  CL 103 101  CO2 25 25  GLUCOSE 115* 118*  BUN 37* 42*  CREATININE 2.08* 2.66*  CALCIUM 9.3 9.5  MG 1.7  --    Liver Function Tests No results for input(s): AST, ALT, ALKPHOS, BILITOT, PROT, ALBUMIN in the last 72 hours. No results for input(s): LIPASE, AMYLASE in the last 72 hours. Cardiac  Enzymes No results for input(s): CKTOTAL, CKMB, CKMBINDEX, TROPONINI in the last 72 hours. BNP Invalid input(s): POCBNP D-Dimer No results for input(s): DDIMER in the last 72 hours. Hemoglobin A1C No results for input(s): HGBA1C in the last 72 hours. Fasting Lipid Panel No results for input(s): CHOL, HDL, LDLCALC, TRIG, CHOLHDL, LDLDIRECT in the last 72 hours. Thyroid Function Tests No results for input(s): TSH, T4TOTAL, T3FREE, THYROIDAB in the last 72 hours.  Invalid input(s): FREET3 _____________  DG  Chest 2 View  Result Date: 05/22/2019 CLINICAL DATA:  Preop for TAVR on May 26, 2019 EXAM: CHEST - 2 VIEW COMPARISON:  CT chest 12/09/2018, radiograph 02/14/2018 FINDINGS: Postsurgical changes related to prior CABG including intact and aligned sternotomy wires and multiple surgical clips projecting over the mediastinum. Mild cardiomegaly is similar to comparison radiograph and CT. The aorta is calcified. The remaining cardiomediastinal contours are unremarkable. Bilateral effusions are present. No pneumothorax. No consolidation or convincing features of edema. No acute osseous or soft tissue abnormality. Degenerative changes are present in the imaged spine and shoulders. Battery pack overlies the right chest wall. IMPRESSION: No acute cardiopulmonary disease.  Chronic bilateral effusions. Stable cardiomegaly. Postsurgical changes related to prior CABG. Aortic Atherosclerosis (ICD10-I70.0). Electronically Signed   By: Lovena Le M.D.   On: 05/22/2019 21:22   CARDIAC CATHETERIZATION  Result Date: 04/29/2019 1.  Nonobstructive coronary disease with mild diffuse stenosis of the LAD, left circumflex, and RCA 2.  Total occlusion of the first OM branch of the circumflex with continued patency of the saphenous vein graft to OM 3.  Severe aortic stenosis with mean gradient 33 mmHg and calculated valve area 0.8 cm 4.  Large V waves consistent with severe mitral regurgitation Recommendation: Continue multidisciplinary evaluation for this patient with severe low-flow low gradient aortic stenosis, probable severe mitral regurgitation with previous mitral repair, and progressive heart failure symptoms  CT CORONARY MORPH W/CTA COR W/SCORE W/CA W/CM &/OR WO/CM  Addendum Date: 05/16/2019   ADDENDUM REPORT: 05/16/2019 00:32 CLINICAL DATA:  86 year old male with severe aortic stenosis being evaluated for a TAVR procedure. EXAM: Cardiac TAVR CT TECHNIQUE: The patient was scanned on a [employer]. A 120 kV  retrospective scan was triggered in the descending thoracic aorta at 111 HU's. Gantry rotation speed was 250 msecs and collimation was .6 mm. No beta blockade or nitro were given. The 3D data set was reconstructed in 5% intervals of the R-R cycle. Systolic and diastolic phases were analyzed on a dedicated work station using MPR, MIP and VRT modes. The patient received 80 cc of contrast. FINDINGS: Aortic Valve: Trileaflet aortic valve with moderately calcified leaflets, severely reduced leaflets opening and no calcifications extending into the LVOT. Aorta: Normal size with mild diffuse calcifications and no dissection. Sinotubular Junction: 30 x 28 mm Ascending Thoracic Aorta: 31 x 31 mm Aortic Arch: 28 x 27 mm Descending Thoracic Aorta: 21 x 20 mm Sinus of Valsalva Measurements: Non-coronary: 37 mm Right -coronary: 33 mm Left -coronary: 34 mm Virtual Basal Annulus Measurements: Maximum/Minimum Diameter: 30.0 x 36.5 mm Mean Diameter: 27.3 mm Perimeter: 88.6 mm Area: 586 mm2 Coronary Artery Height above Annulus: Left Main: 17 mm Right Coronary: 22 mm Optimum Fluoroscopic Angle for Delivery: RAO 2 CRA 2 IMPRESSION: 1. Trileaflet aortic valve with moderately calcified leaflets, severely reduced leaflets opening and no calcifications extending into the LVOT. Aortic valve calcium score 2655 consistent with severe aortic stenosis. Annular measurements suitable for delivery of a 29 mm Edwrads-SAPIEN 3 Ultra valve.  2. Sufficient coronary to annulus distance. 3. Optimum Fluoroscopic Angle for Delivery: RAO 2 CRA 2. 4. No thrombus in the left atrial appendage. 5. Dilated pulmonary artery measuring 34 mm suggestive of pulmonary hypertension. Electronically Signed   By: Ena Dawley   On: 05/16/2019 00:32   Result Date: 05/16/2019 EXAM: OVER-READ INTERPRETATION  CT CHEST The following report is an over-read performed by radiologist Dr. Vinnie Langton of [facility], [place] on 05/13/2019. This over-read does not  include interpretation of cardiac or coronary anatomy or pathology. The coronary calcium score/coronary CTA interpretation by the cardiologist is attached. COMPARISON:  Chest CT 12/09/2018. FINDINGS: Extracardiac findings will be described separately under dictation for contemporaneously obtained CTA chest, abdomen and pelvis. IMPRESSION: Please see separate dictation for contemporaneously obtained CTA chest, abdomen and pelvis dated 05/13/2019 for full description of relevant extracardiac findings. Electronically Signed: By: Vinnie Langton M.D. On: 05/13/2019 11:18   CT ANGIO CHEST AORTA W/CM & OR WO/CM  Result Date: 05/13/2019 CLINICAL DATA:  86 year old male with history of severe aortic stenosis. Preprocedural study prior to potential transcatheter aortic valve replacement (TAVR) procedure. EXAM: CT ANGIOGRAPHY CHEST, ABDOMEN AND PELVIS TECHNIQUE: Multidetector CT imaging through the chest, abdomen and pelvis was performed using the standard protocol during bolus administration of intravenous contrast. Multiplanar reconstructed images and MIPs were obtained and reviewed to evaluate the vascular anatomy. CONTRAST:  93mL OMNIPAQUE IOHEXOL 350 MG/ML SOLN COMPARISON:  Chest CT 12/09/2018. FINDINGS: CTA CHEST FINDINGS Cardiovascular: Heart size is mildly enlarged. There is no significant pericardial fluid, thickening or pericardial calcification. There is aortic atherosclerosis, as well as atherosclerosis of the great vessels of the mediastinum and the coronary arteries, including calcified atherosclerotic plaque in the left main, left anterior descending, left circumflex and right coronary arteries. Status post median sternotomy for CABG. Severe thickening calcification of the aortic valve. Mediastinum/Lymph Nodes: No pathologically enlarged mediastinal or hilar lymph nodes. Esophagus is unremarkable in appearance. No axillary lymphadenopathy. Lungs/Pleura: Slight enlargement of a small right upper lobe  pulmonary nodule (axial image 40 of series 16) which currently measures 9 x 5 mm (mean diameter of 7 mm), previously only a mean diameter of 4 mm. No other larger more suspicious appearing pulmonary nodules or masses are noted. Moderate bilateral pleural effusions lying dependently. This is associated with minimal subsegmental atelectasis in the lung bases bilaterally. No acute consolidative airspace disease. Musculoskeletal/Soft Tissues: Median sternotomy wires. There are no aggressive appearing lytic or blastic lesions noted in the visualized portions of the skeleton. CTA ABDOMEN AND PELVIS FINDINGS Hepatobiliary: Subcentimeter low-attenuation lesion in segment 5 of the liver, too small to characterize, but statistically likely to represent a tiny cyst. Liver has a shrunken appearance and nodular contour, indicative of underlying cirrhosis. No definite suspicious cystic or solid hepatic lesions. No intra or extrahepatic biliary ductal dilatation. Gallbladder is normal in appearance. Pancreas: No pancreatic mass. No pancreatic ductal dilatation. No pancreatic or peripancreatic fluid collections or inflammatory changes. Spleen: Unremarkable. Adrenals/Urinary Tract: Bilateral kidneys and adrenal glands are normal in appearance. No hydroureteronephrosis. Urinary bladder is normal in appearance. Stomach/Bowel: Normal appearance of the stomach. No pathologic dilatation of small bowel or colon. Normal appendix. Vascular/Lymphatic: Aortic atherosclerosis, with vascular findings and measurements pertinent to potential TAVR procedure, as detailed below. No aneurysm or dissection noted in the abdominal or pelvic vasculature. No lymphadenopathy noted in the abdomen or pelvis. Reproductive: Prostate gland and seminal vesicles are unremarkable in appearance. Other: Trace volume of ascites.  No pneumoperitoneum. Musculoskeletal: There are no  aggressive appearing lytic or blastic lesions noted in the visualized portions of the  skeleton. VASCULAR MEASUREMENTS PERTINENT TO TAVR: AORTA: Minimal Aortic Diameter-14 x 10 mm Severity of Aortic Calcification-moderate RIGHT PELVIS: Right Common Iliac Artery - Minimal Diameter-10 x 8.6 mm Tortuosity-mild Calcification-moderate Right External Iliac Artery - Minimal Diameter-8.3 x 7.6 mm Tortuosity-moderate to severe Calcification-mild Right Common Femoral Artery - Minimal Diameter-8.2 x 8.6 mm Tortuosity-mild Calcification-mild LEFT PELVIS: Left Common Iliac Artery - Minimal Diameter-8.0 x 8.1 mm Tortuosity-mild-to-moderate Calcification-moderate Left External Iliac Artery - Minimal Diameter-9.0 x 8.5 mm Tortuosity-moderate Calcification-none Left Common Femoral Artery - Minimal Diameter-8.0 x 8.2 mm Tortuosity-mild Calcification-mild Review of the MIP images confirms the above findings. IMPRESSION: 1. Vascular findings and measurements pertinent to potential TAVR procedure, as detailed above. 2. Severe thickening calcification of the aortic valve, compatible with reported clinical history of severe aortic stenosis. 3. Enlarging right upper lobe pulmonary nodule measuring 9 x 5 mm (mean diameter of 7 mm), which previously had a mean diameter of only 4 mm on 12/09/2018. This nodule is concerning, but is currently likely below the level of PET-CT resolution. Close attention on repeat noncontrast chest CT is recommended in 3-6 months to reassess this lesion. 4. Moderate bilateral pleural effusions lying dependently with mild dependent subsegmental atelectasis in the lower lobes of the lungs bilaterally. 5. Morphologic changes in the liver indicative of underlying cirrhosis. Trace volume of ascites. 6. Additional incidental findings, as above. Electronically Signed   By: Vinnie Langton M.D.   On: 05/13/2019 11:58   ECHO TEE  Result Date: 05/20/2019    TRANSESOPHOGEAL ECHO REPORT   Patient Name:   [name] Date of Exam: 05/20/2019 Medical Rec #:  [id number]      Height:       68.0 in Accession #:     [id number]     Weight:       163.0 lb Date of Birth:  1931-12-23     BSA:          1.874 m Patient Age:    30 years       BP:           105/45 mmHg Patient Gender: M              HR:           71 bpm. Exam Location:  Inpatient Procedure: Transesophageal Echo and Color Doppler Indications:     Severe mitral regurgitation                  Aortic stenosis  History:         Patient has prior history of Echocardiogram examinations, most                  recent 01/13/2019. Prior CABG, Aortic stenosis,                  Arrythmias:Atrial Fibrillation, Signs/Symptoms:Dyspnea; Risk                  Factors:Dyslipidemia. History mitral valve repair                  Chronic kidney disease.  Sonographer:     Vikki Ports Turrentine Referring Phys:  [id number] Eileen Stanford Diagnosing Phys: Oswaldo Milian MD PROCEDURE: The transesophogeal probe was passed without difficulty through the esophogus of the patient. Sedation performed by different physician. The patient developed no complications during the procedure. IMPRESSIONS  1. Left ventricular ejection fraction, by  estimation, is 20 to 25%. The left ventricle has severely decreased function. The left ventricle demonstrates global hypokinesis. The left ventricular internal cavity size was moderately dilated.  2. Right ventricular systolic function is moderately reduced. The right ventricular size is moderately enlarged. There is moderately elevated pulmonary artery systolic pressure.  3. Left atrial size was moderately dilated.  4. Right atrial size was moderately dilated.  5. The mitral valve has been repaired. There is a prosthetic annuloplasty ring present in the mitral position. Posterior leaflet restriction causes coaptation defect at P3. Mild to moderate mitral valve regurgitation. 2D ERO 0.3 cm^2, RV 33cc. 3D ERO 0.2 cm^2.  6. The aortic valve is abnormal. Aortic valve regurgitation is not visualized. Severe aortic valve stenosis. Vmax 2.7 m/s, MG 29mmHg. Valve  appears severely calcified and 3D planimetry valve area was 0.4 cm^2. Findings consistent with low flow, low gradient severe AS. FINDINGS  Left Ventricle: Left ventricular ejection fraction, by estimation, is 20 to 25%. The left ventricle has severely decreased function. The left ventricle demonstrates global hypokinesis. The left ventricular internal cavity size was moderately dilated. There is no left ventricular hypertrophy. Right Ventricle: The right ventricular size is moderately enlarged. No increase in right ventricular wall thickness. Right ventricular systolic function is moderately reduced. There is moderately elevated pulmonary artery systolic pressure. The tricuspid  regurgitant velocity is 2.79 m/s, and with an assumed right atrial pressure of 10 mmHg, the estimated right ventricular systolic pressure is 25.0 mmHg. Left Atrium: Left atrial size was moderately dilated. Right Atrium: Right atrial size was moderately dilated. Pericardium: There is no evidence of pericardial effusion. Mitral Valve: The mitral valve has been repaired/replaced. Mild to moderate mitral valve regurgitation. There is a prosthetic annuloplasty ring present in the mitral position. Tricuspid Valve: The tricuspid valve is normal in structure. Tricuspid valve regurgitation is mild. Aortic Valve: The aortic valve is abnormal. Aortic valve regurgitation is not visualized. Severe aortic stenosis is present. There is severe calcifcation of the aortic valve. Aortic valve mean gradient measures 17.0 mmHg. Aortic valve peak gradient measures 28.1 mmHg. Pulmonic Valve: The pulmonic valve was not well visualized. Pulmonic valve regurgitation is mild. Aorta: The aortic root is normal in size and structure. IAS/Shunts: No atrial level shunt detected by color flow Doppler.  AORTIC VALVE AV Vmax:      265.00 cm/s AV Vmean:     196.000 cm/s AV VTI:       0.566 m AV Peak Grad: 28.1 mmHg AV Mean Grad: 17.0 mmHg MR PISA:        3.08 cm TRICUSPID  VALVE MR PISA Radius: 0.70 cm  TR Peak grad:   31.1 mmHg                          TR Vmax:        279.00 cm/s Oswaldo Milian MD Electronically signed by Oswaldo Milian MD Signature Date/Time: 05/20/2019/4:59:30 PM    Final    VAS US CAROTID  Result Date: 05/13/2019 Carotid Arterial Duplex Study Indications:       AS/TAVR work up. Risk Factors:      Hypertension, hyperlipidemia, coronary artery disease. Other Factors:     History of S/P CABG x 1, chronic kidney disease. Comparison Study:  Last Carotid duplex on 11/07/16 - 1/39%. Today, stable. Performing Technologist: Oda Cogan RDMS, RVT  Examination Guidelines: A complete evaluation includes B-mode imaging, spectral Doppler, color Doppler, and power Doppler as needed of all  accessible portions of each vessel. Bilateral testing is considered an integral part of a complete examination. Limited examinations for reoccurring indications may be performed as noted.  Right Carotid Findings: +----------+--------+--------+--------+------------------+------------------+           PSV cm/sEDV cm/sStenosisPlaque DescriptionComments           +----------+--------+--------+--------+------------------+------------------+ CCA Prox  45      12                                                   +----------+--------+--------+--------+------------------+------------------+ CCA Distal53      14                                intimal thickening +----------+--------+--------+--------+------------------+------------------+ ICA Prox  44      15      1-39%   calcific                             +----------+--------+--------+--------+------------------+------------------+ ICA Distal60      21                                                   +----------+--------+--------+--------+------------------+------------------+ ECA       45      8                                                     +----------+--------+--------+--------+------------------+------------------+ +----------+--------+-------+----------------+-------------------+           PSV cm/sEDV cmsDescribe        Arm Pressure (mmHG) +----------+--------+-------+----------------+-------------------+ [id number]             Multiphasic, WNL                    +----------+--------+-------+----------------+-------------------+ +---------+--------+--+--------+--+---------+ VertebralPSV cm/s45EDV cm/s11Antegrade +---------+--------+--+--------+--+---------+  Left Carotid Findings: +----------+--------+--------+--------+------------------+------------------+           PSV cm/sEDV cm/sStenosisPlaque DescriptionComments           +----------+--------+--------+--------+------------------+------------------+ CCA Prox  52      17                                                   +----------+--------+--------+--------+------------------+------------------+ CCA Distal67      18                                intimal thickening +----------+--------+--------+--------+------------------+------------------+ ICA Prox  46      22      1-39%   calcific                             +----------+--------+--------+--------+------------------+------------------+ ICA Distal57      20                                                   +----------+--------+--------+--------+------------------+------------------+  ECA       48      11                                                   +----------+--------+--------+--------+------------------+------------------+ +----------+--------+--------+----------------+-------------------+           PSV cm/sEDV cm/sDescribe        Arm Pressure (mmHG) +----------+--------+--------+----------------+-------------------+ [id number]              Multiphasic, WNL                    +----------+--------+--------+----------------+-------------------+  +---------+--------+--+--------+--+---------+ VertebralPSV cm/s40EDV cm/s14Antegrade +---------+--------+--+--------+--+---------+   Summary: Right Carotid: Velocities in the right ICA are consistent with a 1-39% stenosis. Left Carotid: Velocities in the left ICA are consistent with a 1-39% stenosis. Vertebrals:  Bilateral vertebral arteries demonstrate antegrade flow. Subclavians: Normal flow hemodynamics were seen in bilateral subclavian              arteries. *See table(s) above for measurements and observations.  Electronically signed by Harold Barban MD on 05/13/2019 at 10:31:16 PM.    Final    ECHOCARDIOGRAM LIMITED  Result Date: 05/27/2019    ECHOCARDIOGRAM REPORT   Patient Name:   [name] Date of Exam: 05/27/2019 Medical Rec #:  [id number]      Height:       69.0 in Accession #:    [id number]     Weight:       166.8 lb Date of Birth:  03/27/1931     BSA:          1.913 m Patient Age:    73 years       BP:           116/65 mmHg Patient Gender: M              HR:           104 bpm. Exam Location:  Inpatient Procedure: Limited Echo, Limited Color Doppler and Cardiac Doppler Indications:    Post TAVR evaluation V43.3 / Z95.2  History:        Patient has prior history of Echocardiogram examinations, most                 recent 05/26/2019. CHF, CAD, Prior CABG, TIA, Arrythmias:Atrial                 Fibrillation; Risk Factors:Hypertension, Dyslipidemia and Sleep                 Apnea. Chronic kidney disease. Carotid artery disease.                 Aortic Valve: 29 mm Edwards Sapien prosthetic, stented (TAVR)                 valve is present in the aortic position. Procedure Date:                 05/26/2019.                 Mitral Valve: 28 mm Seguin semirigid ring prosthetic                 annuloplasty ring valve is present in the mitral position.                 Procedure Date: 09/16/2002.  Sonographer:    Darlina Sicilian RDCS Referring Phys: [id number] [place]  1. Left  ventricular ejection fraction, by estimation, is 20 to 25%. The left ventricle has severely decreased function. The left ventricle demonstrates global hypokinesis. The left ventricular internal cavity size was moderately dilated. Left ventricular diastolic function could not be evaluated. Left ventricular diastolic function could not be evaluated.  2. Right ventricular systolic function is moderately reduced. The right ventricular size is severely enlarged. There is moderately elevated pulmonary artery systolic pressure.  3. Left atrial size was moderately dilated.  4. Right atrial size was moderately dilated.  5. The mitral valve has been repaired/replaced. Mild to moderate mitral valve regurgitation. There is a 28 mm Seguin semirigid ring prosthetic annuloplasty ring present in the mitral position. Procedure Date: 09/16/2002.  6. Tricuspid valve regurgitation is moderate.  7. The aortic valve has been repaired/replaced. Aortic valve regurgitation is not visualized. There is a 29 mm Edwards Sapien prosthetic (TAVR) valve present in the aortic position. Procedure Date: 05/26/2019. Echo findings are consistent with normal structure and function of the aortic valve prosthesis. Aortic valve area, by VTI measures 4.15 cm. Aortic valve mean gradient measures 4.3 mmHg. Aortic valve Vmax measures 1.42 m/s.  8. The inferior vena cava is dilated in size with <50% respiratory variability, suggesting right atrial pressure of 15 mmHg. FINDINGS  Left Ventricle: Left ventricular ejection fraction, by estimation, is 20 to 25%. The left ventricle has severely decreased function. The left ventricle demonstrates global hypokinesis. The left ventricular internal cavity size was moderately dilated. There is no left ventricular hypertrophy. Abnormal (paradoxical) septal motion consistent with post-operative status. Left ventricular diastolic function could not be evaluated due to mitral valve repair. Left ventricular diastolic function  could not be evaluated. Right Ventricle: The right ventricular size is severely enlarged. No increase in right ventricular wall thickness. Right ventricular systolic function is moderately reduced. There is moderately elevated pulmonary artery systolic pressure. The tricuspid regurgitant velocity is 2.53 m/s, and with an assumed right atrial pressure of 15 mmHg, the estimated right ventricular systolic pressure is 44.0 mmHg. Left Atrium: Left atrial size was moderately dilated. Right Atrium: Right atrial size was moderately dilated. Pericardium: There is no evidence of pericardial effusion. Mitral Valve: The mitral valve has been repaired/replaced. Mild to moderate mitral valve regurgitation. There is a 28 mm Seguin semirigid ring prosthetic annuloplasty ring present in the mitral position. Procedure Date: 09/16/2002. MV peak gradient, 7.6 mmHg. The mean mitral valve gradient is 3.5 mmHg. Tricuspid Valve: The tricuspid valve is normal in structure. Tricuspid valve regurgitation is moderate. Aortic Valve: The aortic valve has been repaired/replaced. Aortic valve regurgitation is not visualized. Aortic valve mean gradient measures 4.3 mmHg. Aortic valve peak gradient measures 8.1 mmHg. Aortic valve area, by VTI measures 4.15 cm. There is a 29 mm Edwards Sapien prosthetic, stented (TAVR) valve present in the aortic position. Procedure Date: 05/26/2019. Echo findings are consistent with normal structure and function of the aortic valve prosthesis. Pulmonic Valve: The pulmonic valve was not well visualized. Pulmonic valve regurgitation is not visualized. Aorta: The aortic root is normal in size and structure. Venous: The inferior vena cava is dilated in size with less than 50% respiratory variability, suggesting right atrial pressure of 15 mmHg. IAS/Shunts: No atrial level shunt detected by color flow Doppler.  LEFT VENTRICLE PLAX 2D LVOT diam:     2.70 cm      Diastology LV SV:  92           LV e' medial:   2.87  cm/s LV SV Index:   48           LV E/e' medial: 48.6 LVOT Area:     5.73 cm  LV Volumes (MOD) LV vol d, MOD A2C: 220.0 ml LV vol d, MOD A4C: 223.0 ml LV vol s, MOD A2C: 176.0 ml LV vol s, MOD A4C: 130.0 ml LV SV MOD A2C:     44.0 ml LV SV MOD A4C:     223.0 ml LV SV MOD BP:      71.8 ml AORTIC VALVE AV Area (Vmax):    4.68 cm AV Area (Vmean):   4.26 cm AV Area (VTI):     4.15 cm AV Vmax:           142.00 cm/s AV Vmean:          98.567 cm/s AV VTI:            0.222 m AV Peak Grad:      8.1 mmHg AV Mean Grad:      4.3 mmHg LVOT Vmax:         116.00 cm/s LVOT Vmean:        73.300 cm/s LVOT VTI:          0.161 m LVOT/AV VTI ratio: 0.73 MITRAL VALVE                TRICUSPID VALVE MV Area (PHT): 3.65 cm     TV Mean grad:   0.6 mmHg MV Peak grad:  7.6 mmHg     TR Peak grad:   25.6 mmHg MV Mean grad:  3.5 mmHg     TR Vmax:        253.00 cm/s MV Vmax:       1.38 m/s MV Vmean:      79.4 cm/s    SHUNTS MV Decel Time: 208 msec     Systemic VTI:  0.16 m MR Peak grad: 38.7 mmHg     Systemic Diam: 2.70 cm MR Mean grad: 29.0 mmHg MR Vmax:      311.00 cm/s MR Vmean:     259.0 cm/s MV E velocity: 139.50 cm/s Sanda Klein MD Electronically signed by Sanda Klein MD Signature Date/Time: 05/27/2019/8:52:08 PM    Final    ECHOCARDIOGRAM LIMITED  Result Date: 05/26/2019    ECHOCARDIOGRAM LIMITED REPORT   Patient Name:   [name] Date of Exam: 05/26/2019 Medical Rec #:  [id number]      Height:       69.0 in Accession #:    [id number]     Weight:       170.9 lb Date of Birth:  April 19, 1931     BSA:          1.932 m Patient Age:    89 years       BP:           116/82 mmHg Patient Gender: M              HR:           88 bpm. Exam Location:  Inpatient Procedure: Limited Echo, Cardiac Doppler and Color Doppler Indications:     Aortic stenosis  History:         Patient has prior history of Echocardiogram examinations, most  recent 05/20/2019. CHF, CAD, Prior CABG, TIA, Mitral Valve                  Disease, Aortic  Valve Disease and MV repair, Arrythmias:Atrial                  Fibrillation; Risk Factors:Hypertension and Dyslipidemia.                  Aortic Valve: Edwards Medtronic, stented (TAVR) valve is                  present in the aortic position. Procedure Date: 05/26/2019.  Sonographer:     Dustin Flock Referring Phys:  [phone] MICHAEL COOPER Diagnosing Phys: Sanda Klein, MD                   PRE-PROCEDURAL FINDINGS:                   Moderately dilated left ventricle with global hypokinesis and                  severely reduced systolic function. Estimated LVEF 20-25%.                  There are no regional wall motion abnormalities.                  Dilated right ventricle with severely depressed systolic                  function.                  Severe calcific aortic stenosis. Trileaflet aortic valve.                  Peak aortic valve gradient 31 mm Hg, mean gradient 19 mm Hg.                  Dimensionless obstructive index 0.18, calculated aortic valve                  area is 0.8 cm sq.                  Mild-moderate aortic insufficiency.                  No pericardial effusion.                   POST-PROCEDURAL FINDINGS:                  Moderately dilated left ventricle with global hypokinesis and                  severely reduced systolic function. Estimated LVEF 20-25%.                  There are no regional wall motion abnormalities.                  Dilated right ventricle with severely depressed systolic                  function.                  Well deployed 29 mm Edwards Sapien3 stent-valve                  Peak aortic valve gradient 3 mm Hg, mean gradient 2 mm Hg.  Dimensionless obstructive index 0.74, calculated aortic valve                  area is 4.1 cm.                  There is no aortic insufficiency and no perivalvular leak.                  Moderate mitral insufficiency. S/P annuloplsaty repair and                  neo-chordae.                  No pericardial  effusion. IMPRESSIONS  1. Left ventricular ejection fraction, by estimation, is 20 to 25%. The left ventricle has severely decreased function. The left ventricle demonstrates global hypokinesis. The left ventricular internal cavity size was moderately dilated.  2. Left atrial size was moderately dilated.  3. The mitral valve has been repaired/replaced. Mild to moderate mitral valve regurgitation.  4. The aortic valve has an indeterminant number of cusps. Aortic valve regurgitation is not visualized. Severe aortic valve stenosis. There is a Acupuncturist, stented (TAVR) valve present in the aortic position. Procedure Date: 05/26/2019.  5. Tricuspid regurgitation signal is inadequate for assessing PA pressure. FINDINGS  Left Ventricle: Left ventricular ejection fraction, by estimation, is 20 to 25%. The left ventricle has severely decreased function. The left ventricle demonstrates global hypokinesis. The left ventricular internal cavity size was moderately dilated. Right Ventricle: Tricuspid regurgitation signal is inadequate for assessing PA pressure. Left Atrium: Left atrial size was moderately dilated. Right Atrium: Right atrial size was not well visualized. Pericardium: There is no evidence of pericardial effusion. Mitral Valve: The mitral valve has been repaired/replaced. There is moderate thickening of the mitral valve leaflet(s). Mild to moderate mitral valve regurgitation. There is a prosthetic annuloplasty ring present in the mitral position. Tricuspid Valve: The tricuspid valve is normal in structure. Tricuspid valve regurgitation is not demonstrated. Aortic Valve: The aortic valve has an indeterminant number of cusps. . There is severe thickening and severe calcifcation of the aortic valve. Aortic valve regurgitation is not visualized. Severe aortic stenosis is present. There is severe thickening of the aortic valve. There is severe calcifcation of the aortic valve. Aortic valve mean gradient measures  19.0 mmHg. Aortic valve peak gradient measures 30.9 mmHg. Aortic valve area, by VTI measures 0.81 cm. There is a Acupuncturist, stented (TAVR) valve present in the aortic position. Procedure Date: 05/26/2019. Pulmonic Valve: The pulmonic valve was grossly normal. Pulmonic valve regurgitation is mild to moderate. Aorta: The aortic root is normal in size and structure.  LEFT VENTRICLE PLAX 2D LVOT diam:     2.50 cm LV SV:         48 LV SV Index:   25 LVOT Area:     4.91 cm  AORTIC VALVE AV Area (Vmax):    0.96 cm AV Area (Vmean):   0.86 cm AV Area (VTI):     0.81 cm AV Vmax:           278.00 cm/s AV Vmean:          210.000 cm/s AV VTI:            0.593 m AV Peak Grad:      30.9 mmHg AV Mean Grad:      19.0 mmHg LVOT Vmax:         54.50 cm/s LVOT Vmean:  36.933 cm/s LVOT VTI:          0.098 m LVOT/AV VTI ratio: 0.17  SHUNTS Systemic VTI:  0.10 m Systemic Diam: 2.50 cm Sanda Klein MD Electronically signed by Sanda Klein MD Signature Date/Time: 05/26/2019/1:15:30 PM    Final    Structural Heart Procedure  Result Date: 05/26/2019 See surgical note for result.  CT ANGIO ABDOMEN PELVIS  W &/OR WO CONTRAST  Result Date: 05/13/2019 CLINICAL DATA:  86 year old male with history of severe aortic stenosis. Preprocedural study prior to potential transcatheter aortic valve replacement (TAVR) procedure. EXAM: CT ANGIOGRAPHY CHEST, ABDOMEN AND PELVIS TECHNIQUE: Multidetector CT imaging through the chest, abdomen and pelvis was performed using the standard protocol during bolus administration of intravenous contrast. Multiplanar reconstructed images and MIPs were obtained and reviewed to evaluate the vascular anatomy. CONTRAST:  38mL OMNIPAQUE IOHEXOL 350 MG/ML SOLN COMPARISON:  Chest CT 12/09/2018. FINDINGS: CTA CHEST FINDINGS Cardiovascular: Heart size is mildly enlarged. There is no significant pericardial fluid, thickening or pericardial calcification. There is aortic atherosclerosis, as well as  atherosclerosis of the great vessels of the mediastinum and the coronary arteries, including calcified atherosclerotic plaque in the left main, left anterior descending, left circumflex and right coronary arteries. Status post median sternotomy for CABG. Severe thickening calcification of the aortic valve. Mediastinum/Lymph Nodes: No pathologically enlarged mediastinal or hilar lymph nodes. Esophagus is unremarkable in appearance. No axillary lymphadenopathy. Lungs/Pleura: Slight enlargement of a small right upper lobe pulmonary nodule (axial image 40 of series 16) which currently measures 9 x 5 mm (mean diameter of 7 mm), previously only a mean diameter of 4 mm. No other larger more suspicious appearing pulmonary nodules or masses are noted. Moderate bilateral pleural effusions lying dependently. This is associated with minimal subsegmental atelectasis in the lung bases bilaterally. No acute consolidative airspace disease. Musculoskeletal/Soft Tissues: Median sternotomy wires. There are no aggressive appearing lytic or blastic lesions noted in the visualized portions of the skeleton. CTA ABDOMEN AND PELVIS FINDINGS Hepatobiliary: Subcentimeter low-attenuation lesion in segment 5 of the liver, too small to characterize, but statistically likely to represent a tiny cyst. Liver has a shrunken appearance and nodular contour, indicative of underlying cirrhosis. No definite suspicious cystic or solid hepatic lesions. No intra or extrahepatic biliary ductal dilatation. Gallbladder is normal in appearance. Pancreas: No pancreatic mass. No pancreatic ductal dilatation. No pancreatic or peripancreatic fluid collections or inflammatory changes. Spleen: Unremarkable. Adrenals/Urinary Tract: Bilateral kidneys and adrenal glands are normal in appearance. No hydroureteronephrosis. Urinary bladder is normal in appearance. Stomach/Bowel: Normal appearance of the stomach. No pathologic dilatation of small bowel or colon. Normal  appendix. Vascular/Lymphatic: Aortic atherosclerosis, with vascular findings and measurements pertinent to potential TAVR procedure, as detailed below. No aneurysm or dissection noted in the abdominal or pelvic vasculature. No lymphadenopathy noted in the abdomen or pelvis. Reproductive: Prostate gland and seminal vesicles are unremarkable in appearance. Other: Trace volume of ascites.  No pneumoperitoneum. Musculoskeletal: There are no aggressive appearing lytic or blastic lesions noted in the visualized portions of the skeleton. VASCULAR MEASUREMENTS PERTINENT TO TAVR: AORTA: Minimal Aortic Diameter-14 x 10 mm Severity of Aortic Calcification-moderate RIGHT PELVIS: Right Common Iliac Artery - Minimal Diameter-10 x 8.6 mm Tortuosity-mild Calcification-moderate Right External Iliac Artery - Minimal Diameter-8.3 x 7.6 mm Tortuosity-moderate to severe Calcification-mild Right Common Femoral Artery - Minimal Diameter-8.2 x 8.6 mm Tortuosity-mild Calcification-mild LEFT PELVIS: Left Common Iliac Artery - Minimal Diameter-8.0 x 8.1 mm Tortuosity-mild-to-moderate Calcification-moderate Left External Iliac Artery - Minimal Diameter-9.0 x 8.5  mm Tortuosity-moderate Calcification-none Left Common Femoral Artery - Minimal Diameter-8.0 x 8.2 mm Tortuosity-mild Calcification-mild Review of the MIP images confirms the above findings. IMPRESSION: 1. Vascular findings and measurements pertinent to potential TAVR procedure, as detailed above. 2. Severe thickening calcification of the aortic valve, compatible with reported clinical history of severe aortic stenosis. 3. Enlarging right upper lobe pulmonary nodule measuring 9 x 5 mm (mean diameter of 7 mm), which previously had a mean diameter of only 4 mm on 12/09/2018. This nodule is concerning, but is currently likely below the level of PET-CT resolution. Close attention on repeat noncontrast chest CT is recommended in 3-6 months to reassess this lesion. 4. Moderate bilateral  pleural effusions lying dependently with mild dependent subsegmental atelectasis in the lower lobes of the lungs bilaterally. 5. Morphologic changes in the liver indicative of underlying cirrhosis. Trace volume of ascites. 6. Additional incidental findings, as above. Electronically Signed   By: Vinnie Langton M.D.   On: 05/13/2019 11:58   Disposition   Pt is being discharged home today in good condition.  Follow-up Plans & Appointments    Follow-up Information    Eileen Stanford, PA-C. Go on 06/04/2019.   Specialties: Cardiology, Radiology Why: @ 1:30pm, please arrive at least 10 minutes early.  Contact information: [address] [place] [address] [phone]        [facility]. Go on 06/01/2019.   Why: Please come anytime between 8am and 4pm for labs.  Contact information: [place] [address] [phone]           Discharge Medications   Allergies as of 05/28/2019      Reactions   Asa [aspirin] Other (See Comments)   Bleeding- has internal hemorrhoids   Flomax [tamsulosin] Palpitations, Other (See Comments)   Made his heart go out of rhythm   Pirfenidone    Weight loss, loss of appetite.      Medication List    STOP taking these medications   mupirocin ointment 2 % Commonly known as: BACTROBAN     TAKE these medications   amoxicillin 500 MG tablet Commonly known as: AMOXIL Take 2,000 mg by mouth See admin instructions. Take 2000 mg by mouth one hour prior to dental visits   apixaban 5 MG Tabs tablet Commonly known as: ELIQUIS Take 2.5 mg by mouth 2 (two) times daily.   feeding supplement (ENSURE ENLIVE) Liqd Take 237 mLs by mouth 2 (two) times daily between meals.   hydrocortisone 2.5 % rectal cream Commonly known as: ANUSOL-HC Place 1 application rectally 2 (two) times daily. What changed:   when to take this  reasons to take  this   levothyroxine 75 MCG tablet Commonly known as: SYNTHROID Take 75 mcg by mouth daily before breakfast.   metoprolol succinate 50 MG 24 hr tablet Commonly known as: TOPROL-XL Take 1 tablet (50 mg total) by mouth daily.   mirtazapine 15 MG tablet Commonly known as: REMERON Take 0.5 tablets (7.5 mg total) by mouth at bedtime. What changed: how much to take   Potassium Chloride ER 20 MEQ Tbcr Take 20 mEq by mouth daily. Start taking on: May 30, 2019 What changed:   when to take this  These instructions start on May 30, 2019. If you are unsure what to do until then, ask your doctor or other care provider.   pravastatin 80 MG tablet Commonly known as: PRAVACHOL Take 80 mg by  mouth every Monday, Wednesday, and Friday.   SYSTANE OP Place 1 drop into both eyes daily as needed (dry eyes).   torsemide 20 MG tablet Commonly known as: DEMADEX Take 2 tablets (40 mg total) by mouth daily. Start taking on: May 30, 2019 What changed:   when to take this  These instructions start on May 30, 2019. If you are unsure what to do until then, ask your doctor or other care provider.   vitamin B-12 1000 MCG tablet Commonly known as: CYANOCOBALAMIN Take 1,000 mcg by mouth daily.   Vitamin D-3 25 MCG (1000 UT) Caps Take 1,000 Units by mouth daily.           Outstanding Labs/Studies   BMET 3/15  Duration of Discharge Encounter   Greater than 30 minutes including physician time.  SignedAngelena Form, PA-C 05/28/2019, 8:28 AM [phone]

## 2019-05-28 NOTE — Plan of Care (Signed)
°  Problem: Clinical Measurements: °Goal: Respiratory complications will improve °Outcome: Progressing °Goal: Cardiovascular complication will be avoided °Outcome: Progressing °  °Problem: Activity: °Goal: Risk for activity intolerance will decrease °Outcome: Progressing °  °

## 2019-05-28 NOTE — Plan of Care (Signed)
Continue to monitor

## 2019-05-28 NOTE — Plan of Care (Signed)
Discharge to home °

## 2019-05-29 ENCOUNTER — Telehealth: Payer: Self-pay | Admitting: Physician Assistant

## 2019-05-29 NOTE — Telephone Encounter (Signed)
  [place] VALVE TEAM   Patient contacted regarding discharge from [facility] on 05/28/10  Patient understands to follow up with provider Nell Range on 3/18 at [facility].  Patient understands discharge instructions? yes Patient understands medications and regimen? yes Patient understands to bring all medications to this visit? yes  Angelena Form PA-C  [facility]

## 2019-06-01 ENCOUNTER — Telehealth: Payer: Self-pay | Admitting: Physician Assistant

## 2019-06-01 NOTE — Telephone Encounter (Signed)
° °  Pt's wife requesting to come in with the pt on his appt on 06/04/19. She said pt is weak and needs assistance   Please advise

## 2019-06-01 NOTE — Telephone Encounter (Signed)
Informed [name] she may come up with [name] to his visit. She was grateful for assistance.

## 2019-06-03 NOTE — Progress Notes (Addendum)
[facility] [place]                                       Cardiology Office Note    Date:  06/04/2019   ID:  [name], DOB December 17, 1931, MRN [id number]  PCP:  [name], MD  Cardiologist: Buford Dresser, MD / Dr. Burt Knack & Dr. Roxy Manns (TAVR)  CC: TOC s/p TAVR  History of Present Illness:  [name] is a 86 y.o. 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   Patient's cardiac history dates back to 2004 when he underwent mitral valve repair and coronary artery bypass grafting x1 by Dr. Servando Snare. By report the patient had mitral valve prolapse and underwent quadrangular resection of the posterior leaflet with artificial Gore-Tex neocords placement and ring annuloplasty using a 28 mm Seguinsemirigid ring. At the time he underwent coronary artery bypass grafting using a reverse greater saphenous vein graft to the obtuse marginal branch of the left circumflex.   The patient developed persistent atrial fibrillation for which she has remained on long-term anticoagulation, initially using warfarin and more recently using Eliquis. 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 Patient was hospitalized in October 2019 with acute exacerbation of shortness of breath and congestive heart failure. He required intensive inpatient diuresis with readjustment of his medications, and since that time he has been followed regularly  by Dr. Harrell Gave.The patient has remained in longstanding persistent atrial fibrillation and both heart rate and symptoms of congestive heart failure have been difficult to control. Previous echocardiograms have revealed what was felt to be mild aortic stenosis, but more recent echocardiograms have suggested significant progression with the possibility of severe low-flow low gradient aortic stenosis. Most recent transthoracic echocardiogram performed January 13, 2019 revealed severe global left ventricular systolic dysfunction with ejection fraction estimated only 20 to 25% in the setting of possibly severe mitral regurgitation. There was moderate to severe left ventricular chamber enlargement but no significant left ventricular hypertrophy. The aortic valve leaflets appeared severely calcified. Peak velocity across aortic valve measured 2.3 m/s corresponding to mean transvalvular gradient estimated 12.2 mmHg but the aortic valve area was notably estimated only 0.77 cm with DVI reported 0.18 and stroke-volume index reported 29.16. Diagnostic cardiac catheterization was performed April 28, 2019. Patient was found to have a mild coronary artery disease with chronic occlusion of the first obtuse marginal branch of the left circumflex but continued patency of the vein graft to the same branch. Otherwise there was mild diffuse disease in the distal right coronary artery and nonobstructive disease in the left coronary system. Catheterization confirmed presence of aortic stenosis with mean transvalvular gradient measured 33 mmHg at catheterization corresponding to aortic valve area calculated 0.8 cm. There was moderate to severe pulmonary hypertension with PA pressures measured 54/17 despite relatively low systemic arterial pressure. There were large V waves on pulmonary capillary wedge pressure consistent with severe  mitral regurgitation.Mean central venous pressure was 11 mmHg.  The was evaluated by the  multidisciplinary valve team and underwent successful TAVR with a31mm Edwards Sapien 3 THV via the TF approach on 05/26/19. Post operative echo showed EF 20-25%, normally functioning TAVR with a mean gradient of 4.3 mm HG and no PVL. Creat bumped to 2.66 and torsemide was held x 2 days at discharge. He was discharged on Eliquis alone given history of rectal bleeding on aspirin. Plans were made for follow up BMET on 3/15, but this was never done.  Today he presents to clinic for follow up. He is here with his wife today. Doing excellent. He says he feels tremendously better. They feel like his improvement has been a miracle. No CP or SOB. No LE edema, orthopnea or PND. No dizziness or syncope. No blood in stool or urine. No palpitations. Has been walking everyday with a big improvement in his breathing stamina.    Past Medical History:  Diagnosis Date  . Adenomatous colon polyp   . CAD (coronary artery disease)    a. S/P CABG x 1 @ time of MV annuloplasty;  b. 02/2010 ETT: neg for ischemia.  . Carotid artery stenosis    1-39% by dopplers 10/2016  . Chronic combined systolic (congestive) and diastolic (congestive) heart failure ([place])   . CKD (chronic kidney disease), stage III   . Current use of long term anticoagulation    a. Coumadin in setting of afib.  . Diverticulosis   . Dyslipidemia   . Essential hypertension   . H/O mitral valve repair 09/16/2002   Complex valvuloplasty including quadrangular resection of posterior leaflet with artificial Gore-tex neochords and 28 mm Seguin ring annuloplasty - Dr Servando Snare  . Hemorrhoids   . Hyperlipidemia   . Hypertension   . Permanent atrial fibrillation (HCC)    a. CHA2DS2VASc = 6 -->chronic coumadin;  b. Prev on amio-->d/c 2/2 hypothyroidism. >> resumed 5/16. c. Notes from 2017 indicate interstitial lung disease by pulm appt, question amio toxicity, also increased liver attenuation. Rate control pursued and amiodarone discontinued.  . S/P CABG x 1  09/16/2002   SVG to OM  . 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 Sleep apnea 12/2018   per patient's spouse wears BIPAP "every other night or so"  . Thrombocytopenia ([place])   . TIA (transient ischemic attack)     Past Surgical History:  Procedure Laterality Date  . CATARACT EXTRACTION W/ INTRAOCULAR LENS  IMPLANT, BILATERAL     per patient about 4 years ago  . CORONARY ARTERY BYPASS GRAFT    . INTRAOPERATIVE TRANSTHORACIC ECHOCARDIOGRAM N/A 05/26/2019   Procedure: INTRAOPERATIVE TRANSTHORACIC ECHOCARDIOGRAM;  Surgeon: Sherren Mocha, MD;  Location: [facility];  Service: Open Heart Surgery;  Laterality: N/A;  . MITRAL VALVE REPAIR    . RIGHT/LEFT HEART CATH AND CORONARY/GRAFT ANGIOGRAPHY N/A 04/28/2019   Procedure: RIGHT/LEFT HEART CATH AND CORONARY/GRAFT ANGIOGRAPHY;  Surgeon: Sherren Mocha, MD;  Location: [facility];  Service: Cardiovascular;  Laterality: N/A;  . TEE WITHOUT CARDIOVERSION N/A 05/20/2019   Procedure: TRANSESOPHAGEAL ECHOCARDIOGRAM (TEE);  Surgeon: Donato Heinz, MD;  Location: [facility] ENDOSCOPY;  Service: Cardiovascular;  Laterality: N/A;  . TRANSCATHETER AORTIC VALVE REPLACEMENT, TRANSFEMORAL N/A 05/26/2019   Procedure: TRANSCATHETER AORTIC VALVE REPLACEMENT, TRANSFEMORAL;  Surgeon: Sherren Mocha, MD;  Location: [facility];  Service: Open Heart Surgery;  Laterality: N/A;    Current Medications: Outpatient Medications Prior to Visit  Medication Sig Dispense Refill  . amoxicillin (AMOXIL) 500 MG tablet Take 2,000 mg by mouth See admin instructions. Take 2000 mg by mouth one hour prior to dental visits     . apixaban (ELIQUIS) 5 MG TABS tablet Take 2.5 mg by mouth 2 (two) times daily.    . Cholecalciferol (VITAMIN D-3) 1000 units CAPS Take 1,000 Units by mouth daily.     Marland Kitchen docusate sodium (COLACE) 100 MG capsule Take 100 mg by mouth 2 (two) times daily.    .  feeding supplement, ENSURE ENLIVE, (ENSURE ENLIVE) LIQD Take 237 mLs by mouth 2 (two) times daily between meals. 237 mL 12  . hydrocortisone 2.5 % cream Apply topically 2 (two) times daily.    Marland Kitchen levothyroxine (SYNTHROID) 75 MCG tablet Take 75 mcg by mouth daily before breakfast.     . metoprolol succinate (TOPROL-XL) 50 MG 24 hr tablet Take 1 tablet (50 mg total) by mouth daily. 90 tablet 3  . mirtazapine (REMERON) 15 MG tablet Take 0.5 tablets (7.5 mg total) by mouth at bedtime.    Vladimir Faster Glycol-Propyl Glycol (SYSTANE OP) Place 1 drop into both eyes daily as needed (dry eyes).    . potassium chloride SA (KLOR-CON) 20 MEQ tablet Take 20 mEq by mouth 2 (two) times daily.    . pravastatin (PRAVACHOL) 80 MG tablet Take 80 mg by mouth every Monday, Wednesday, and Friday.     . torsemide (DEMADEX) 20 MG tablet Take 2 tablets (40 mg total) by mouth daily.    . vitamin B-12 (CYANOCOBALAMIN) 1000 MCG tablet Take 1,000 mcg by mouth daily.    . hydrocortisone (ANUSOL-HC) 2.5 % rectal cream Place 1 application rectally 2 (two) times daily. (Patient taking differently: Place 1 application rectally 2 (two) times daily as needed for hemorrhoids. ) 30 g 1  . Potassium Chloride ER 20 MEQ TBCR Take 20 mEq by mouth daily. (Patient not taking: Reported on 06/04/2019) 60 tablet 1   No facility-administered medications prior to visit.     Allergies:   Asa [aspirin], Flomax [tamsulosin], and Pirfenidone   Social History   Socioeconomic History  . Marital status: Married    Spouse name: Not on file  . Number of children: 2  . Years of education: Not on file  . Highest education level: Not on file  Occupational History  . 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 Smoking status: Former Smoker    Packs/day: 0.10    Years: 2.00    Pack years: 0.20    Types: Cigarettes    Quit date: 03/20/1955    Years since quitting: 64.2  . Smokeless tobacco: Never Used  Substance and Sexual  Activity  . Alcohol use: No    Alcohol/week: 0.0 standard drinks  . Drug use: No  . Sexual activity: Not Currently  Other Topics Concern  . Not on file  Social History Narrative  . Not on file   Social Determinants of Health   Financial Resource Strain:   . Difficulty of Paying Living Expenses:   Food Insecurity:   . Worried About Charity fundraiser in the Last Year:   . Arboriculturist in the Last Year:   Transportation Needs:   . Film/video editor (Medical):   Marland Kitchen Lack of Transportation (Non-Medical):   Physical Activity:   . Days of Exercise per  Week:   . Minutes of Exercise per Session:   Stress:   . Feeling of Stress :   Social Connections:   . Frequency of Communication with Friends and Family:   . Frequency of Social Gatherings with Friends and Family:   . Attends Religious Services:   . Active Member of Clubs or Organizations:   . Attends Archivist Meetings:   Marland Kitchen Marital Status:      Family History:  The patient's family history includes Heart disease in his father; Heart failure in his brother; Stroke in his brother and mother.     ROS:   Please see the history of present illness.    ROS All other systems reviewed and are negative.   PHYSICAL EXAM:   VS:  BP 100/60   Pulse 81   Ht 5\' 9"  (1.753 m)   Wt 163 lb 6.4 oz (74.1 kg)   SpO2 94%   BMI 24.13 kg/m    GEN: Well nourished, well developed, in no acute distress HEENT: normal Neck: no JVD or masses Cardiac: irreg irreg.; no murmurs, rubs, or gallops,no edema  Respiratory:  clear to auscultation bilaterally, normal work of breathing GI: soft, nontender, nondistended, + BS MS: no deformity or atrophy Skin: warm and dry, no rash.  Groin sites clear without hematoma or ecchymosis  Neuro:  Alert and Oriented x 3, Strength and sensation are intact Psych: euthymic mood, full affect   Wt Readings from Last 3 Encounters:  06/04/19 163 lb 6.4 oz (74.1 kg)  05/28/19 165 lb 12.6 oz (75.2 kg)    05/22/19 170 lb 14.4 oz (77.5 kg)      Studies/Labs Reviewed:   EKG:  EKG is ordered today.  The ekg ordered today demonstrates afib HR 81  Recent Labs: 05/22/2019: ALT 17; B Natriuretic Peptide 3,448.0 05/27/2019: Magnesium 1.7 05/28/2019: BUN 42; Creatinine, Ser 2.66; Hemoglobin 12.6; Platelets 58; Potassium 4.6; Sodium 138   Lipid Panel    Component Value Date/Time   CHOL 193 06/17/2017 0744   TRIG 114 06/17/2017 0744   HDL 35 (L) 06/17/2017 0744   CHOLHDL 5.5 (H) 06/17/2017 0744   CHOLHDL 2.6 03/16/2016 0903   VLDL 20 03/16/2016 0903   LDLCALC 135 (H) 06/17/2017 0744    Additional studies/ records that were reviewed today include:  TAVR OPERATIVE NOTE   Date of Procedure:05/26/2019  Preoperative Diagnosis:SevereLow-Flow, Low-GradientAortic Stenosis   Postoperative Diagnosis:Same   Procedure:   Transcatheter Aortic Valve Replacement - PercutaneousRightTransfemoral Approach Edwards Sapien 3 Ultra THV (size 42mm, model # 9600TFX, serial [id number])  Co-Surgeons:Clarence H. Roxy Manns, MD and Sherren Mocha, MD  Anesthesiologist:Kevin Conrad Coaldale, MD  Echocardiographer:Mihai Croitoru, MD  Pre-operative Echo Findings: ? Severe aortic stenosis ? Severeleft ventricular systolic dysfunction  Post-operative Echo Findings: ? Noparavalvular leak ? Unchangedleft ventricular systolic function  ____________________   Echo3/10/21: IMPRESSIONS  1. Left ventricular ejection fraction, by estimation, is 20 to 25%. The  left ventricle has severely decreased function. The left ventricle  demonstrates global hypokinesis. The left ventricular internal cavity size  was moderately dilated. Left ventricular diastolic function could not be evaluated. Left ventricular  diastolic function could not be evaluated.  2. Right ventricular systolic function is  moderately reduced. The right  ventricular size is severely enlarged. There is moderately elevated  pulmonary artery systolic pressure.  3. Left atrial size was moderately dilated.  4. Right atrial size was moderately dilated.  5. The mitral valve has been repaired/replaced. Mild to moderate mitral  valve regurgitation.  There is a 28 mm Seguin semirigid ring prosthetic  annuloplasty ring present in the mitral position. Procedure Date:  09/16/2002.  6. Tricuspid valve regurgitation is moderate.  7. The aortic valve has been repaired/replaced. Aortic valve  regurgitation is not visualized. There is a 29 mm Edwards Sapien  prosthetic (TAVR) valve present in the aortic position. Procedure Date:  05/26/2019. Echo findings are consistent with normal structure and function of the aortic valve prosthesis. Aortic valve area, by VTI measures 4.15 cm. Aortic valve mean gradient measures 4.3 mmHg.  Aortic valve Vmax measures 1.42 m/s.  8. The inferior vena cava is dilated in size with <50% respiratory  variability, suggesting right atrial pressure of 15 mmHg.    ASSESSMENT & PLAN:   Severe AS s/p TAVR:doing excellent. Patient and wife feel like his improvement has been a miracle. Continue on aspirin and eliquis. SBE prophylaxis discussed; I have RX'd amoxicillin. I will see him back next month for follow up and echo  Chronic combined S/D CHF: appears euvolemic. Continue on current therapy with Toprol XL 50mg  daily, and Torsemide 40mg  daily. No ACE/ARB/ARNI due to renal insufficiency.   CKD stage IV: creat up to 2.66 at discharge (review of renal function shows creat baseline 1.6-2.0). He was supposed to have a BMET earlier this week, but it was never completed. Will check today.   Chronic afib: rate well controlled. Continue long term Eliquis 5mg  BID  HTN: BP on soft side. This is asymptomatic.   Thrombocytopenia: platelets down to 52--> 58 at discharge. Will check CBC today.   CAD  s/p CABG: pre TAVR cath showednonobstructive CAD with mild diffuse stenosis of the LAD, left circumflex, and RCA. Total occlusion of the first OM branch of the circumflex with continued patency of the saphenous vein graft to OM.Continue medical therapy.  Pulmonary nodule: pre TAVR CT showed an enlarging right upper lobe pulmonary nodule measuring 9 x 5 mm (mean diameter of 7 mm), which previously had a mean diameter of only 4 mm on 12/09/2018. This nodule is concerning, but is currently likely below the level of PET-CT resolution. Close attention on repeat noncontrast chest CT is recommended in 3-6 months to reassess this lesion.I will have this arranged at the 1 month appointment.    Medication Adjustments/Labs and Tests Ordered: Current medicines are reviewed at length with the patient today.  Concerns regarding medicines are outlined above.  Medication changes, Labs and Tests ordered today are listed in the Patient Instructions below. Patient Instructions  Medication Instructions:  Your physician recommends that you continue on your current medications as directed. Please refer to the Current Medication list given to you today.  *If you need a refill on your cardiac medications before your next appointment, please call your pharmacy*   Lab Work: TODAY - CBC, BMET If you have labs (blood work) drawn today and your tests are completely normal, you will receive your results only by: Marland Kitchen MyChart Message (if you have MyChart) OR . A paper copy in the mail If you have any lab test that is abnormal or we need to change your treatment, we will call you to review the results.   Testing/Procedures: None Ordered    Follow-Up: At [facility], you and your health needs are our priority.  As part of our continuing mission to provide you with exceptional heart care, we have created designated Provider Care Teams.  These Care Teams include your primary Cardiologist (physician) and Advanced  Practice Providers (APPs -  Physician Assistants and Nurse Practitioners) who all work together to provide you with the care you need, when you need it.  We recommend signing up for the patient portal called "MyChart".  Sign up information is provided on this After Visit Summary.  MyChart is used to connect with patients for Virtual Visits (Telemedicine).  Patients are able to view lab/test results, encounter notes, upcoming appointments, etc.  Non-urgent messages can be sent to your provider as well.   To learn more about what you can do with MyChart, go to [email or web address].    Your next appointment:   April 8 at 2:05 for Echo And at 3:00 with Nell Range, PA  The format for your next appointment:   In Person  Provider:   April 21 with Dr. Harrell Gave at 9:40 am for virtual appointment       Signed, Angelena Form, PA-C  06/04/2019 2:14 PM    [address], [place], [place]  [address] Phone: [phone]; Fax: [phone]

## 2019-06-04 ENCOUNTER — Encounter: Payer: Self-pay | Admitting: Physician Assistant

## 2019-06-04 ENCOUNTER — Ambulatory Visit (INDEPENDENT_AMBULATORY_CARE_PROVIDER_SITE_OTHER): Payer: PPO | Admitting: Physician Assistant

## 2019-06-04 ENCOUNTER — Other Ambulatory Visit: Payer: Self-pay

## 2019-06-04 VITALS — BP 100/60 | HR 81 | Ht 69.0 in | Wt 163.4 lb

## 2019-06-04 DIAGNOSIS — I482 Chronic atrial fibrillation, unspecified: Secondary | ICD-10-CM

## 2019-06-04 DIAGNOSIS — Z952 Presence of prosthetic heart valve: Secondary | ICD-10-CM

## 2019-06-04 DIAGNOSIS — I10 Essential (primary) hypertension: Secondary | ICD-10-CM

## 2019-06-04 DIAGNOSIS — N184 Chronic kidney disease, stage 4 (severe): Secondary | ICD-10-CM | POA: Diagnosis not present

## 2019-06-04 DIAGNOSIS — I5042 Chronic combined systolic (congestive) and diastolic (congestive) heart failure: Secondary | ICD-10-CM | POA: Diagnosis not present

## 2019-06-04 DIAGNOSIS — D696 Thrombocytopenia, unspecified: Secondary | ICD-10-CM | POA: Diagnosis not present

## 2019-06-04 DIAGNOSIS — R911 Solitary pulmonary nodule: Secondary | ICD-10-CM | POA: Diagnosis not present

## 2019-06-04 NOTE — Patient Instructions (Addendum)
Medication Instructions:  Your physician recommends that you continue on your current medications as directed. Please refer to the Current Medication list given to you today.  *If you need a refill on your cardiac medications before your next appointment, please call your pharmacy*   Lab Work: TODAY - CBC, BMET If you have labs (blood work) drawn today and your tests are completely normal, you will receive your results only by: Marland Kitchen MyChart Message (if you have MyChart) OR . A paper copy in the mail If you have any lab test that is abnormal or we need to change your treatment, we will call you to review the results.   Testing/Procedures: None Ordered    Follow-Up: At [facility], you and your health needs are our priority.  As part of our continuing mission to provide you with exceptional heart care, we have created designated Provider Care Teams.  These Care Teams include your primary Cardiologist (physician) and Advanced Practice Providers (APPs -  Physician Assistants and Nurse Practitioners) who all work together to provide you with the care you need, when you need it.  We recommend signing up for the patient portal called "MyChart".  Sign up information is provided on this After Visit Summary.  MyChart is used to connect with patients for Virtual Visits (Telemedicine).  Patients are able to view lab/test results, encounter notes, upcoming appointments, etc.  Non-urgent messages can be sent to your provider as well.   To learn more about what you can do with MyChart, go to [email or web address].    Your next appointment:   April 8 at 2:05 for Echo And at 3:00 with Nell Range, PA  The format for your next appointment:   In Person  Provider:   April 21 with Dr. Harrell Gave at 9:40 am for virtual appointment

## 2019-06-05 ENCOUNTER — Telehealth: Payer: Self-pay

## 2019-06-05 LAB — CBC
Hematocrit: 40.3 % (ref 37.5–51.0)
Hemoglobin: 13 g/dL (ref 13.0–17.7)
MCH: 31.2 pg (ref 26.6–33.0)
MCHC: 32.3 g/dL (ref 31.5–35.7)
MCV: 97 fL (ref 79–97)
Platelets: 87 10*3/uL — CL (ref 150–450)
RBC: 4.17 x10E6/uL (ref 4.14–5.80)
RDW: 13.6 % (ref 11.6–15.4)
WBC: 6.2 10*3/uL (ref 3.4–10.8)

## 2019-06-05 LAB — BASIC METABOLIC PANEL
BUN/Creatinine Ratio: 32 — ABNORMAL HIGH (ref 10–24)
BUN: 62 mg/dL — ABNORMAL HIGH (ref 8–27)
CO2: 27 mmol/L (ref 20–29)
Calcium: 10 mg/dL (ref 8.6–10.2)
Chloride: 98 mmol/L (ref 96–106)
Creatinine, Ser: 1.94 mg/dL — ABNORMAL HIGH (ref 0.76–1.27)
GFR calc Af Amer: 35 mL/min/{1.73_m2} — ABNORMAL LOW (ref >59)
GFR calc non Af Amer: 31 mL/min/{1.73_m2} — ABNORMAL LOW (ref >59)
Glucose: 76 mg/dL (ref 65–99)
Potassium: 5.2 mmol/L (ref 3.5–5.2)
Sodium: 143 mmol/L (ref 134–144)

## 2019-06-05 NOTE — Telephone Encounter (Signed)
The patient has been notified of the lab result and verbalized understanding.  All questions (if any) were answered. Frederik Schmidt, RN 06/05/2019 1:41 PM

## 2019-06-05 NOTE — Telephone Encounter (Signed)
-----   Message from Eileen Stanford, PA-C sent at 06/05/2019 12:54 PM EDT ----- Can you let him know his renal function is improved and platelets are also better. Thank you!

## 2019-06-12 ENCOUNTER — Other Ambulatory Visit: Payer: Self-pay | Admitting: Cardiology

## 2019-06-12 DIAGNOSIS — I4891 Unspecified atrial fibrillation: Secondary | ICD-10-CM

## 2019-06-12 DIAGNOSIS — I5042 Chronic combined systolic (congestive) and diastolic (congestive) heart failure: Secondary | ICD-10-CM

## 2019-06-13 DIAGNOSIS — G4731 Primary central sleep apnea: Secondary | ICD-10-CM | POA: Diagnosis not present

## 2019-06-15 DIAGNOSIS — G4700 Insomnia, unspecified: Secondary | ICD-10-CM | POA: Diagnosis not present

## 2019-06-15 DIAGNOSIS — E039 Hypothyroidism, unspecified: Secondary | ICD-10-CM | POA: Diagnosis not present

## 2019-06-15 DIAGNOSIS — N183 Chronic kidney disease, stage 3 unspecified: Secondary | ICD-10-CM | POA: Diagnosis not present

## 2019-06-15 DIAGNOSIS — I501 Left ventricular failure: Secondary | ICD-10-CM | POA: Diagnosis not present

## 2019-06-15 DIAGNOSIS — I5030 Unspecified diastolic (congestive) heart failure: Secondary | ICD-10-CM | POA: Diagnosis not present

## 2019-06-15 DIAGNOSIS — E7800 Pure hypercholesterolemia, unspecified: Secondary | ICD-10-CM | POA: Diagnosis not present

## 2019-06-15 DIAGNOSIS — I2510 Atherosclerotic heart disease of native coronary artery without angina pectoris: Secondary | ICD-10-CM | POA: Diagnosis not present

## 2019-06-15 DIAGNOSIS — I5022 Chronic systolic (congestive) heart failure: Secondary | ICD-10-CM | POA: Diagnosis not present

## 2019-06-15 DIAGNOSIS — I10 Essential (primary) hypertension: Secondary | ICD-10-CM | POA: Diagnosis not present

## 2019-06-15 DIAGNOSIS — I480 Paroxysmal atrial fibrillation: Secondary | ICD-10-CM | POA: Diagnosis not present

## 2019-06-22 DIAGNOSIS — E039 Hypothyroidism, unspecified: Secondary | ICD-10-CM | POA: Diagnosis not present

## 2019-06-24 NOTE — Progress Notes (Signed)
[facility]                                       Cardiology Office Note    Date:  06/26/2019   ID:  [name], DOB 05-22-1931, MRN [id number]  PCP:  [name], MD  Cardiologist: Buford Dresser, MD / Dr. Burt Knack & Dr. Roxy Manns (TAVR)  CC: 1 month s/p TAVR  History of Present Illness:  [name] is a 86 y.o. 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   Patient's cardiac history dates back to 2004 when he underwent mitral valve repair and coronary artery bypass grafting x1 by Dr. Servando Snare. By report the patient had mitral valve prolapse and underwent quadrangular resection of the posterior leaflet with artificial Gore-Tex neocords placement and ring annuloplasty using a 28 mm Seguinsemirigid ring. At the time he underwent coronary artery bypass grafting using a reverse greater saphenous vein graft to the obtuse marginal branch of the left circumflex.   The patient developed persistent atrial fibrillation for which she has remained on long-term anticoagulation, initially using warfarin and more recently using Eliquis. 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 Patient was hospitalized in October 2019 with acute exacerbation of shortness of breath and congestive heart failure. He required intensive inpatient diuresis with readjustment of his medications, and since that time he has been followed  regularly by Dr. Harrell Gave.The patient has remained in longstanding persistent atrial fibrillation and both heart rate and symptoms of congestive heart failure have been difficult to control. Previous echocardiograms have revealed what was felt to be mild aortic stenosis, but more recent echocardiograms have suggested significant progression with the possibility of severe low-flow low gradient aortic stenosis. Most recent transthoracic echocardiogram performed January 13, 2019 revealed severe global left ventricular systolic dysfunction with ejection fraction estimated only 20 to 25% in the setting of possibly severe mitral regurgitation. There was moderate to severe left ventricular chamber enlargement but no significant left ventricular hypertrophy. The aortic valve leaflets appeared severely calcified. Peak velocity across aortic valve measured 2.3 m/s corresponding to mean transvalvular gradient estimated 12.2 mmHg but the aortic valve area was notably estimated only 0.77 cm with DVI reported 0.18 and stroke-volume index reported 29.16. Diagnostic cardiac catheterization was performed April 28, 2019. Patient was found to have a mild coronary artery disease with chronic occlusion of the first obtuse marginal branch of the left circumflex but continued patency of the vein graft to the same branch. Otherwise there was mild diffuse disease in the distal right coronary artery and nonobstructive disease in the left coronary system. Catheterization confirmed presence of aortic stenosis with mean transvalvular gradient measured 33 mmHg at catheterization corresponding to aortic valve area calculated 0.8 cm. There was moderate to severe pulmonary hypertension with PA pressures measured 54/17 despite relatively low systemic arterial pressure. There were large V waves on pulmonary capillary wedge pressure consistent with  severe mitral regurgitation.Mean central venous pressure was 11 mmHg.  The pt was  evaluated by the multidisciplinary valve team and underwent successful TAVR with a76mm Edwards Sapien 3 THV via the TF approach on 05/26/19. Post operative echo showed EF 20-25%, normally functioning TAVR with a mean gradient of 4.3 mm HG and no PVL. Creat bumped to 2.66 and torsemide was held x 2 days at discharge. He was discharged on Eliquis alone given history of rectal bleeding on aspirin. Plans were made for follow up BMET on 3/15. Follow up BMET on 3/18 showed improvement in creat to 1.94. He has felt markedly better since TAVR.   Today he presents to clinic for follow up. He was having urinary frequency at night and told to cut back from torsemide 40mg  BID to 40mg  in the AM and 20mg  in the PM (around 2pm) which has helped a lot. No CP or SOB. No LE edema, orthopnea or PND. No dizziness or syncope. No blood in stool or urine. He felt like his heart was slow last night but had no associated symptoms. Priot to TAVR, he was in a wheel chair and couldn't do anything. Now he feels back to normal.   Past Medical History:  Diagnosis Date  . Adenomatous colon polyp   . CAD (coronary artery disease)    a. S/P CABG x 1 @ time of MV annuloplasty;  b. 02/2010 ETT: neg for ischemia.  . Carotid artery stenosis    1-39% by dopplers 10/2016  . Chronic combined systolic (congestive) and diastolic (congestive) heart failure ([place])   . CKD (chronic kidney disease), stage III   . Current use of long term anticoagulation    a. Coumadin in setting of afib.  . Diverticulosis   . Dyslipidemia   . Essential hypertension   . H/O mitral valve repair 09/16/2002   Complex valvuloplasty including quadrangular resection of posterior leaflet with artificial Gore-tex neochords and 28 mm Seguin ring annuloplasty - Dr Servando Snare  . Hemorrhoids   . Hyperlipidemia   . Hypertension   . Permanent atrial fibrillation (HCC)    a. CHA2DS2VASc = 6 -->chronic coumadin;  b. Prev on amio-->d/c 2/2 hypothyroidism. >> resumed 5/16. c.  Notes from 2017 indicate interstitial lung disease by pulm appt, question amio toxicity, also increased liver attenuation. Rate control pursued and amiodarone discontinued.  . S/P CABG x 1 09/16/2002   SVG to OM  . 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 Sleep apnea 12/2018   per patient's spouse wears BIPAP "every other night or so"  . Thrombocytopenia ([place])   . TIA (transient ischemic attack)     Past Surgical History:  Procedure Laterality Date  . CATARACT EXTRACTION W/ INTRAOCULAR LENS  IMPLANT, BILATERAL     per patient about 4 years ago  . CORONARY ARTERY BYPASS GRAFT    . INTRAOPERATIVE TRANSTHORACIC ECHOCARDIOGRAM N/A 05/26/2019   Procedure: INTRAOPERATIVE TRANSTHORACIC ECHOCARDIOGRAM;  Surgeon: Sherren Mocha, MD;  Location: [facility];  Service: Open Heart Surgery;  Laterality: N/A;  . MITRAL VALVE REPAIR    . RIGHT/LEFT HEART CATH AND CORONARY/GRAFT ANGIOGRAPHY N/A 04/28/2019   Procedure: RIGHT/LEFT HEART CATH AND CORONARY/GRAFT ANGIOGRAPHY;  Surgeon: Sherren Mocha, MD;  Location: [facility];  Service: Cardiovascular;  Laterality: N/A;  . TEE WITHOUT CARDIOVERSION N/A 05/20/2019   Procedure: TRANSESOPHAGEAL ECHOCARDIOGRAM (TEE);  Surgeon: Oswaldo Milian  L, MD;  Location: [facility];  Service: Cardiovascular;  Laterality: N/A;  . TRANSCATHETER AORTIC VALVE REPLACEMENT, TRANSFEMORAL N/A 05/26/2019   Procedure: TRANSCATHETER AORTIC VALVE REPLACEMENT, TRANSFEMORAL;  Surgeon: Sherren Mocha, MD;  Location: [facility];  Service: Open Heart Surgery;  Laterality: N/A;    Current Medications: Outpatient Medications Prior to Visit  Medication Sig Dispense Refill  . amoxicillin (AMOXIL) 500 MG tablet Take 2,000 mg by mouth See admin instructions. Take 2000 mg by mouth one hour prior to dental visits     . apixaban (ELIQUIS) 5 MG TABS tablet Take 2.5 mg by mouth 2 (two) times  daily.    . Cholecalciferol (VITAMIN D-3) 1000 units CAPS Take 1,000 Units by mouth daily.     Marland Kitchen docusate sodium (COLACE) 100 MG capsule Take 100 mg by mouth 2 (two) times daily.    . feeding supplement, ENSURE ENLIVE, (ENSURE ENLIVE) LIQD Take 237 mLs by mouth 2 (two) times daily between meals. 237 mL 12  . hydrocortisone 2.5 % cream Apply topically 2 (two) times daily.    Marland Kitchen levothyroxine (SYNTHROID) 75 MCG tablet Take 75 mcg by mouth daily before breakfast.     . metoprolol succinate (TOPROL-XL) 50 MG 24 hr tablet TAKE ONE TABLET BY MOUTH ONE TIME DAILY  90 tablet 0  . mirtazapine (REMERON) 15 MG tablet Take 0.5 tablets (7.5 mg total) by mouth at bedtime.    Vladimir Faster Glycol-Propyl Glycol (SYSTANE OP) Place 1 drop into both eyes daily as needed (dry eyes).    . pravastatin (PRAVACHOL) 80 MG tablet Take 80 mg by mouth every Monday, Wednesday, and Friday.     . torsemide (DEMADEX) 20 MG tablet Take 60 mg by mouth daily. 2 tablet in the morning and 1 tablet at night    . vitamin B-12 (CYANOCOBALAMIN) 1000 MCG tablet Take 1,000 mcg by mouth daily.    . potassium chloride SA (KLOR-CON) 20 MEQ tablet Take 20 mEq by mouth 2 (two) times daily.    Marland Kitchen torsemide (DEMADEX) 20 MG tablet Take 2 tablets (40 mg total) by mouth daily. (Patient not taking: Reported on 06/25/2019)     No facility-administered medications prior to visit.     Allergies:   Asa [aspirin], Flomax [tamsulosin], and Pirfenidone   Social History   Socioeconomic History  . Marital status: Married    Spouse name: Not on file  . Number of children: 2  . Years of education: Not on file  . Highest education level: Not on file  Occupational History  . 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 Smoking status: Former Smoker    Packs/day: 0.10    Years: 2.00    Pack years: 0.20    Types: Cigarettes    Quit date: 03/20/1955    Years since quitting: 64.3  . Smokeless tobacco: Never Used  Substance and Sexual  Activity  . Alcohol use: No    Alcohol/week: 0.0 standard drinks  . Drug use: No  . Sexual activity: Not Currently  Other Topics Concern  . Not on file  Social History Narrative  . Not on file   Social Determinants of Health   Financial Resource Strain:   . Difficulty of Paying Living Expenses:   Food Insecurity:   . Worried About Charity fundraiser in the Last Year:   . Arboriculturist in the Last Year:   Transportation Needs:   . Film/video editor (Medical):   Marland Kitchen  Lack of Transportation (Non-Medical):   Physical Activity:   . Days of Exercise per Week:   . Minutes of Exercise per Session:   Stress:   . Feeling of Stress :   Social Connections:   . Frequency of Communication with Friends and Family:   . Frequency of Social Gatherings with Friends and Family:   . Attends Religious Services:   . Active Member of Clubs or Organizations:   . Attends Archivist Meetings:   Marland Kitchen Marital Status:      Family History:  The patient's family history includes Heart disease in his father; Heart failure in his brother; Stroke in his brother and mother.     ROS:   Please see the history of present illness.    ROS All other systems reviewed and are negative.   PHYSICAL EXAM:   VS:  BP (!) 102/50   Pulse 79   Ht 5\' 9"  (1.753 m)   Wt 159 lb 4 oz (72.2 kg)   SpO2 99%   BMI 23.52 kg/m    GEN: Well nourished, well developed, in no acute distress HEENT: normal Neck: no JVD or masses Cardiac: irreg irreg.; no murmurs, rubs, or gallops,no edema  Respiratory:  clear to auscultation bilaterally, normal work of breathing GI: soft, nontender, nondistended, + BS MS: no deformity or atrophy Skin: warm and dry, no rash.   Neuro:  Alert and Oriented x 3, Strength and sensation are intact Psych: euthymic mood, full affect   Wt Readings from Last 3 Encounters:  06/25/19 159 lb 4 oz (72.2 kg)  06/04/19 163 lb 6.4 oz (74.1 kg)  05/28/19 165 lb 12.6 oz (75.2 kg)       Studies/Labs Reviewed:   EKG:  EKG is NOT ordered today.    Recent Labs: 05/22/2019: ALT 17; B Natriuretic Peptide 3,448.0 05/27/2019: Magnesium 1.7 06/04/2019: Hemoglobin 13.0; Platelets 87 06/25/2019: BUN 63; Creatinine, Ser 1.97; Potassium 4.3; Sodium 142   Lipid Panel    Component Value Date/Time   CHOL 193 06/17/2017 0744   TRIG 114 06/17/2017 0744   HDL 35 (L) 06/17/2017 0744   CHOLHDL 5.5 (H) 06/17/2017 0744   CHOLHDL 2.6 03/16/2016 0903   VLDL 20 03/16/2016 0903   LDLCALC 135 (H) 06/17/2017 0744    Additional studies/ records that were reviewed today include:  TAVR OPERATIVE NOTE   Date of Procedure:05/26/2019  Preoperative Diagnosis:SevereLow-Flow, Low-GradientAortic Stenosis   Postoperative Diagnosis:Same   Procedure:   Transcatheter Aortic Valve Replacement - PercutaneousRightTransfemoral Approach Edwards Sapien 3 Ultra THV (size 55mm, model # 9600TFX, serial [id number])  Co-Surgeons:Clarence H. Roxy Manns, MD and Sherren Mocha, MD  Anesthesiologist:Kevin Conrad Winona, MD  Echocardiographer:Mihai Croitoru, MD  Pre-operative Echo Findings: ? Severe aortic stenosis ? Severeleft ventricular systolic dysfunction  Post-operative Echo Findings: ? Noparavalvular leak ? Unchangedleft ventricular systolic function  ____________________   Echo3/10/21: IMPRESSIONS  1. Left ventricular ejection fraction, by estimation, is 20 to 25%. The  left ventricle has severely decreased function. The left ventricle  demonstrates global hypokinesis. The left ventricular internal cavity size  was moderately dilated. Left ventricular diastolic function could not be evaluated. Left ventricular  diastolic function could not be evaluated.  2. Right ventricular systolic function is moderately reduced. The right  ventricular size is severely enlarged. There  is moderately elevated  pulmonary artery systolic pressure.  3. Left atrial size was moderately dilated.  4. Right atrial size was moderately dilated.  5. The mitral valve has been repaired/replaced. Mild to moderate mitral  valve regurgitation. There is a 28 mm Seguin semirigid ring prosthetic  annuloplasty ring present in the mitral position. Procedure Date:  09/16/2002.  6. Tricuspid valve regurgitation is moderate.  7. The aortic valve has been repaired/replaced. Aortic valve  regurgitation is not visualized. There is a 29 mm Edwards Sapien  prosthetic (TAVR) valve present in the aortic position. Procedure Date:  05/26/2019. Echo findings are consistent with normal structure and function of the aortic valve prosthesis. Aortic valve area, by VTI measures 4.15 cm. Aortic valve mean gradient measures 4.3 mmHg.  Aortic valve Vmax measures 1.42 m/s.  8. The inferior vena cava is dilated in size with <50% respiratory  variability, suggesting right atrial pressure of 15 mmHg.    ____________________   Echo4/8/21 IMPRESSIONS    1. Left ventricular ejection fraction, by estimation, is <20%. The left ventricle has severely decreased function. The left ventricle demonstrates global hypokinesis. The left ventricular internal cavity size was mildly dilated. Left ventricular  diastolic parameters are indeterminate.  2. S/p mitral valve repair. Mild-moderate mitral regurgitation. Mean gradient 4 mmHg, mild mitral stenosis.  3. Bioprosthetic aortic valve s/p TAVR, 29 mm Edwards Sapien. Trivial perivalvular regurgitation. Mean gradient 4 mmHg, no significant stenosis.  4. Left atrial size was moderately dilated.  5. Right ventricular systolic function is mildly reduced. The right ventricular size is mildly enlarged. There is mildly elevated pulmonary artery systolic pressure. The estimated right ventricular systolic pressure is 35.5 mmHg.  6. The inferior vena cava is normal in size  with <50% respiratory variability, suggesting right atrial pressure of 8 mmHg.   ASSESSMENT & PLAN:   Severe AS s/p TAVR: echo today shows EF <20%, normally functioning TAVR with a mean gradient of 9 mm Hg and no PVL. He has NYHA class II symptoms. He has had a dramatic improvement in symptoms since TAVR. He was essentially wheelchair chair bound preceding his surgery and now able to do all his normal activities with only mild limitation. SBE prophylaxis discussed; he has amoxicillin. He will continue on Eliquis. I will see him back in 1 year for follow up and echo.   Chronic combined S/D CHF: appears euvolemic. EF remains severely decreased. Continue on Toprol XL. No ACEi/ARB/ARNI given CKD. Continue on Torsemide 40mg  in the AM and 20mg  in the PM (recently decreased by PCP but potassium not changed). He remains on Kdur 38mEq BID. Last time I did blood work his potassium was 5.2. Will recheck BMET today. I have asked him to decrease potassium to 20 MEq daily.   CKD stage IV: creat improved to 1.94 at last visit which is around his baseline. (review of renal function shows creat baseline 1.6-2.0). BMET today  Chronic afib: rate well controlled. Continue on Eliquis 5mg  BID. He is likely on an inappropriate dose of Eliiquis given age >13yo and renal function. Will check BMET today, if creat remains above 1.5 will decrease dose to 2.5mg  BID.   HTN: BP well controlled today. No changes made.    Thrombocytopenia: repeat CBC showed improvement to 87 at last visit.   CAD s/p CABG: pre TAVR cath showednonobstructive CAD with mild diffuse stenosis of the LAD, left circumflex, and RCA. Total occlusion of the first OM branch of the circumflex with continued patency of the saphenous vein graft to OM.Continue medical therapy.  Pulmonary nodule: pre TAVR CT showed an enlarging right upper lobe pulmonary nodule measuring 9 x 5 mm (mean diameter of 7 mm), which previously had a mean diameter of  only 4 mm  on 12/09/2018. This nodule is concerning, but is currently likely below the level of PET-CT resolution. Close attention on repeat noncontrast chest CT is recommended in 3-6 months to reassess this lesion.He sees his pulmonologist, Dr. Chase Caller on 5/4. I will defer follow up to him.    Medication Adjustments/Labs and Tests Ordered: Current medicines are reviewed at length with the patient today.  Concerns regarding medicines are outlined above.  Medication changes, Labs and Tests ordered today are listed in the Patient Instructions below. Patient Instructions  Medication Instructions:  1) DECREASE POTASSIUM to 20 meq once daily *If you need a refill on your cardiac medications before your next appointment, please call your pharmacy*  Labs: TODAY: BMET  Follow-Up: We will call you to arrange your 1 year TAVR echo and office visit!    Signed, Angelena Form, PA-C  06/26/2019 7:37 AM    [facility] [place], [place], [place]  [address] Phone: [phone]; Fax: [phone]

## 2019-06-25 ENCOUNTER — Encounter: Payer: Self-pay | Admitting: Physician Assistant

## 2019-06-25 ENCOUNTER — Other Ambulatory Visit: Payer: Self-pay

## 2019-06-25 ENCOUNTER — Ambulatory Visit (HOSPITAL_COMMUNITY): Payer: PPO | Attending: Cardiology

## 2019-06-25 ENCOUNTER — Ambulatory Visit (INDEPENDENT_AMBULATORY_CARE_PROVIDER_SITE_OTHER): Payer: PPO | Admitting: Physician Assistant

## 2019-06-25 DIAGNOSIS — I10 Essential (primary) hypertension: Secondary | ICD-10-CM | POA: Diagnosis not present

## 2019-06-25 DIAGNOSIS — R911 Solitary pulmonary nodule: Secondary | ICD-10-CM | POA: Diagnosis not present

## 2019-06-25 DIAGNOSIS — D696 Thrombocytopenia, unspecified: Secondary | ICD-10-CM

## 2019-06-25 DIAGNOSIS — I2510 Atherosclerotic heart disease of native coronary artery without angina pectoris: Secondary | ICD-10-CM | POA: Diagnosis not present

## 2019-06-25 DIAGNOSIS — I482 Chronic atrial fibrillation, unspecified: Secondary | ICD-10-CM | POA: Diagnosis not present

## 2019-06-25 DIAGNOSIS — I5042 Chronic combined systolic (congestive) and diastolic (congestive) heart failure: Secondary | ICD-10-CM | POA: Diagnosis not present

## 2019-06-25 DIAGNOSIS — N184 Chronic kidney disease, stage 4 (severe): Secondary | ICD-10-CM | POA: Diagnosis not present

## 2019-06-25 DIAGNOSIS — Z952 Presence of prosthetic heart valve: Secondary | ICD-10-CM

## 2019-06-25 MED ORDER — POTASSIUM CHLORIDE CRYS ER 20 MEQ PO TBCR: 20.0000 meq | EXTENDED_RELEASE_TABLET | Freq: Every day | ORAL | 3 refills | Status: DC

## 2019-06-25 NOTE — Patient Instructions (Addendum)
Medication Instructions:  1) DECREASE POTASSIUM to 20 meq once daily *If you need a refill on your cardiac medications before your next appointment, please call your pharmacy*  Labs: TODAY: BMET  Follow-Up: We will call you to arrange your 1 year TAVR echo and office visit!

## 2019-06-26 ENCOUNTER — Telehealth: Payer: Self-pay

## 2019-06-26 LAB — BASIC METABOLIC PANEL
BUN/Creatinine Ratio: 32 — ABNORMAL HIGH (ref 10–24)
BUN: 63 mg/dL — ABNORMAL HIGH (ref 8–27)
CO2: 24 mmol/L (ref 20–29)
Calcium: 9.8 mg/dL (ref 8.6–10.2)
Chloride: 99 mmol/L (ref 96–106)
Creatinine, Ser: 1.97 mg/dL — ABNORMAL HIGH (ref 0.76–1.27)
GFR calc Af Amer: 35 mL/min/{1.73_m2} — ABNORMAL LOW (ref >59)
GFR calc non Af Amer: 30 mL/min/{1.73_m2} — ABNORMAL LOW (ref >59)
Glucose: 84 mg/dL (ref 65–99)
Potassium: 4.3 mmol/L (ref 3.5–5.2)
Sodium: 142 mmol/L (ref 134–144)

## 2019-06-26 MED ORDER — APIXABAN 2.5 MG PO TABS: 2.5000 mg | ORAL_TABLET | Freq: Two times a day (BID) | ORAL | 3 refills | Status: DC

## 2019-06-26 NOTE — Progress Notes (Signed)
Sounds great thanks [employer]

## 2019-06-26 NOTE — Telephone Encounter (Signed)
-----   Message from Eileen Stanford, PA-C sent at 06/26/2019  7:41 AM EDT ----- Can you let him know he needs to cut his Eliquis in half. We can call in a new RX if he needs. Also potassium looks okay. Continue same plan.

## 2019-06-26 NOTE — Telephone Encounter (Signed)
Reviewed results with patient's wife (DPR) who verbalized understanding.   She states she is cutting the patient's Eliquis 5 mg in half already. Will call in Eliquis 2.5 BID so she no longer has to cut. She was grateful for assistance.

## 2019-07-08 ENCOUNTER — Telehealth: Payer: PPO | Admitting: Cardiology

## 2019-07-09 DIAGNOSIS — I5022 Chronic systolic (congestive) heart failure: Secondary | ICD-10-CM | POA: Diagnosis not present

## 2019-07-09 DIAGNOSIS — G4700 Insomnia, unspecified: Secondary | ICD-10-CM | POA: Diagnosis not present

## 2019-07-09 DIAGNOSIS — E7800 Pure hypercholesterolemia, unspecified: Secondary | ICD-10-CM | POA: Diagnosis not present

## 2019-07-09 DIAGNOSIS — I5030 Unspecified diastolic (congestive) heart failure: Secondary | ICD-10-CM | POA: Diagnosis not present

## 2019-07-09 DIAGNOSIS — I501 Left ventricular failure: Secondary | ICD-10-CM | POA: Diagnosis not present

## 2019-07-09 DIAGNOSIS — I2510 Atherosclerotic heart disease of native coronary artery without angina pectoris: Secondary | ICD-10-CM | POA: Diagnosis not present

## 2019-07-09 DIAGNOSIS — I480 Paroxysmal atrial fibrillation: Secondary | ICD-10-CM | POA: Diagnosis not present

## 2019-07-09 DIAGNOSIS — I10 Essential (primary) hypertension: Secondary | ICD-10-CM | POA: Diagnosis not present

## 2019-07-09 DIAGNOSIS — E039 Hypothyroidism, unspecified: Secondary | ICD-10-CM | POA: Diagnosis not present

## 2019-07-09 DIAGNOSIS — N183 Chronic kidney disease, stage 3 unspecified: Secondary | ICD-10-CM | POA: Diagnosis not present

## 2019-07-14 ENCOUNTER — Telehealth (INDEPENDENT_AMBULATORY_CARE_PROVIDER_SITE_OTHER): Payer: PPO | Admitting: Cardiology

## 2019-07-14 ENCOUNTER — Encounter: Payer: Self-pay | Admitting: Cardiology

## 2019-07-14 VITALS — BP 86/56 | HR 84 | Ht 68.0 in | Wt 159.0 lb

## 2019-07-14 DIAGNOSIS — I482 Chronic atrial fibrillation, unspecified: Secondary | ICD-10-CM | POA: Diagnosis not present

## 2019-07-14 DIAGNOSIS — Z952 Presence of prosthetic heart valve: Secondary | ICD-10-CM

## 2019-07-14 DIAGNOSIS — I2510 Atherosclerotic heart disease of native coronary artery without angina pectoris: Secondary | ICD-10-CM | POA: Diagnosis not present

## 2019-07-14 DIAGNOSIS — Z7901 Long term (current) use of anticoagulants: Secondary | ICD-10-CM

## 2019-07-14 DIAGNOSIS — I5042 Chronic combined systolic (congestive) and diastolic (congestive) heart failure: Secondary | ICD-10-CM

## 2019-07-14 DIAGNOSIS — Z9889 Other specified postprocedural states: Secondary | ICD-10-CM | POA: Diagnosis not present

## 2019-07-14 DIAGNOSIS — G4731 Primary central sleep apnea: Secondary | ICD-10-CM | POA: Diagnosis not present

## 2019-07-14 DIAGNOSIS — N184 Chronic kidney disease, stage 4 (severe): Secondary | ICD-10-CM | POA: Diagnosis not present

## 2019-07-14 NOTE — Progress Notes (Signed)
Virtual Visit via Telephone Note   This visit type was conducted due to national recommendations for restrictions regarding the COVID-19 Pandemic (e.g. social distancing) in an effort to limit this patient's exposure and mitigate transmission in our community.  Due to his co-morbid illnesses, this patient is at least at moderate risk for complications without adequate follow up.  This format is felt to be most appropriate for this patient at this time.  The patient did not have access to video technology/had technical difficulties with video requiring transitioning to audio format only (telephone).  All issues noted in this document were discussed and addressed.  No physical exam could be performed with this format.  Please refer to the patient's chart for his  consent to telehealth for [facility].   Date:  07/14/2019   ID:  [name], DOB 1931/05/04, MRN [id number]  Patient Location: Home Provider Location: Office  PCP:  [name], MD  Cardiologist:  Buford Dresser, MD  Electrophysiologist:  None   Evaluation Performed:  Follow-Up Visit  Chief Complaint:  Follow up  History of Present Illness:    [name] is a 86 y.o. male with a hx of CAD s/p CABG, mitral valve repair, atrial fibrillation on anticoagulation, interstitial lung disease, chronic systolic and diastolic heart failure, low flow low gradient aortic stenosis s/p TAVR who is seen in follow up today. He follows at the [facility] in [place].   The patient does not have symptoms concerning for COVID-19 infection (fever, chills, cough, or new shortness of breath).   Today: Wife assisting with phone call today. Blood pressure this AM was 86/56. He denies symptoms, and it hasn't been running this low. Has been more 100/70s but sometimes has been lower. No lightheadedness/dizzyness. No falls.   They are thrilled at how much better he has felt since TAVR. Breathing is great, not using the bipap anymore as he  feels worse with it than without it. No LE edema. He has been out of the wheelchair, can walk to the mailbox and back. Weight steady, a bit lower than our last visit. No issues with the bloodthinner, denies bruising/bleeding, no melena/hematochezia/hematuria. He is making good urine on the torsemide; measures his urine at home, thinks it is between 1.5-2 quarts per day.  They haven't seen heart rates this low in ages. Wife feels this is a big part of why his breathing is so much better. We discussed that when we met, his resting heart rate was 120-130 bpm and could easily get to 160 bpm with minimal activity.   Past Medical History:  Diagnosis Date  . Adenomatous colon polyp   . CAD (coronary artery disease)    a. S/P CABG x 1 @ time of MV annuloplasty;  b. 02/2010 ETT: neg for ischemia.  . Carotid artery stenosis    1-39% by dopplers 10/2016  . Chronic combined systolic (congestive) and diastolic (congestive) heart failure ([place])   . CKD (chronic kidney disease), stage III   . Current use of long term anticoagulation    a. Coumadin in setting of afib.  . Diverticulosis   . Dyslipidemia   . Essential hypertension   . H/O mitral valve repair 09/16/2002   Complex valvuloplasty including quadrangular resection of posterior leaflet with artificial Gore-tex neochords and 28 mm Seguin ring annuloplasty - Dr Servando Snare  . Hemorrhoids   . Hyperlipidemia   . Hypertension   . Permanent atrial fibrillation (HCC)    a. CHA2DS2VASc = 6 -->chronic  coumadin;  b. Prev on amio-->d/c 2/2 hypothyroidism. >> resumed 5/16. c. Notes from 2017 indicate interstitial lung disease by pulm appt, question amio toxicity, also increased liver attenuation. Rate control pursued and amiodarone discontinued.  . S/P CABG x 1 09/16/2002   SVG to OM  . 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 Sleep apnea 12/2018   per  patient's spouse wears BIPAP "every other night or so"  . Thrombocytopenia ([place])   . TIA (transient ischemic attack)    Past Surgical History:  Procedure Laterality Date  . CATARACT EXTRACTION W/ INTRAOCULAR LENS  IMPLANT, BILATERAL     per patient about 4 years ago  . CORONARY ARTERY BYPASS GRAFT    . INTRAOPERATIVE TRANSTHORACIC ECHOCARDIOGRAM N/A 05/26/2019   Procedure: INTRAOPERATIVE TRANSTHORACIC ECHOCARDIOGRAM;  Surgeon: Sherren Mocha, MD;  Location: [facility];  Service: Open Heart Surgery;  Laterality: N/A;  . MITRAL VALVE REPAIR    . RIGHT/LEFT HEART CATH AND CORONARY/GRAFT ANGIOGRAPHY N/A 04/28/2019   Procedure: RIGHT/LEFT HEART CATH AND CORONARY/GRAFT ANGIOGRAPHY;  Surgeon: Sherren Mocha, MD;  Location: [facility];  Service: Cardiovascular;  Laterality: N/A;  . TEE WITHOUT CARDIOVERSION N/A 05/20/2019   Procedure: TRANSESOPHAGEAL ECHOCARDIOGRAM (TEE);  Surgeon: Donato Heinz, MD;  Location: [facility];  Service: Cardiovascular;  Laterality: N/A;  . TRANSCATHETER AORTIC VALVE REPLACEMENT, TRANSFEMORAL N/A 05/26/2019   Procedure: TRANSCATHETER AORTIC VALVE REPLACEMENT, TRANSFEMORAL;  Surgeon: Sherren Mocha, MD;  Location: [facility];  Service: Open Heart Surgery;  Laterality: N/A;     Current Meds  Medication Sig  . amoxicillin (AMOXIL) 500 MG tablet Take 2,000 mg by mouth See admin instructions. Take 2000 mg by mouth one hour prior to dental visits   . apixaban (ELIQUIS) 2.5 MG TABS tablet Take 1 tablet (2.5 mg total) by mouth 2 (two) times daily.  . Cholecalciferol (VITAMIN D-3) 1000 units CAPS Take 1,000 Units by mouth daily.   Marland Kitchen docusate sodium (COLACE) 100 MG capsule Take 100 mg by mouth 2 (two) times daily.  . feeding supplement, ENSURE ENLIVE, (ENSURE ENLIVE) LIQD Take 237 mLs by mouth 2 (two) times daily between meals.  . hydrocortisone 2.5 % cream Apply topically as needed.   Marland Kitchen levothyroxine (SYNTHROID) 75 MCG tablet Take 75 mcg by mouth daily before breakfast.    . metoprolol succinate (TOPROL-XL) 50 MG 24 hr tablet TAKE ONE TABLET BY MOUTH ONE TIME DAILY   . mirtazapine (REMERON) 15 MG tablet Take 0.5 tablets (7.5 mg total) by mouth at bedtime.  Vladimir Faster Glycol-Propyl Glycol (SYSTANE OP) Place 1 drop into both eyes daily as needed (dry eyes).  . potassium chloride SA (KLOR-CON) 20 MEQ tablet Take 1 tablet (20 mEq total) by mouth daily.  . pravastatin (PRAVACHOL) 80 MG tablet Take 80 mg by mouth every Monday, Wednesday, and Friday.   . torsemide (DEMADEX) 20 MG tablet Take 60 mg by mouth daily. 2 tablet in the morning and 1 tablet at night  . vitamin B-12 (CYANOCOBALAMIN) 1000 MCG tablet Take 1,000 mcg by mouth daily.     Allergies:   Asa [aspirin], Flomax [tamsulosin], and Pirfenidone   Social History   Tobacco Use  . Smoking status: Former Smoker    Packs/day: 0.10    Years: 2.00    Pack years: 0.20    Types: Cigarettes    Quit date: 03/20/1955  Years since quitting: 64.3  . Smokeless tobacco: Never Used  Substance Use Topics  . Alcohol use: No    Alcohol/week: 0.0 standard drinks  . Drug use: No     Family Hx: The patient's family history includes Heart disease in his father; Heart failure in his brother; Stroke in his brother and mother.  ROS:   Please see the history of present illness.    All other systems reviewed and are negative.   Prior CV studies:   The following studies were reviewed today: Echo 01/13/19  1. Left ventricular ejection fraction, by visual estimation, is 20 to 25%. The left ventricle has severely decreased function. Moderate to severely increased left ventricular size. There is no left ventricular hypertrophy.  2. Left ventricular diastolic function could not be evaluated pattern of LV diastolic filling.  3. Global right ventricle has normal systolic function.The right ventricular size is mildly enlarged. No increase in right ventricular wall thickness.  4. Left atrial size was moderately dilated.  5.  Right atrial size was moderately dilated.  6. Moderate calcification of the mitral valve leaflet(s).  7. Moderate thickening of the mitral valve leaflet(s).  8. Severely decreased mobility of the mitral valve leaflets.  9. The mitral valve has been repaired/replaced. Moderate to severe mitral valve regurgitation. 10. The tricuspid valve is normal in structure. Tricuspid valve regurgitation is mild. 11. The aortic valve is abnormal Aortic valve regurgitation is mild by color flow Doppler. 12. There is Severe calcifcation of the aortic valve. 13. There is Severely thickening of the aortic valve. 14. Suspect low flow-low gradient AS given visual limited movement of aortic valve and severely reduced LV EF. 15. The pulmonic valve was grossly normal. Pulmonic valve regurgitation is mild to moderate by color flow Doppler. 16. Moderately elevated pulmonary artery systolic pressure. 17. The inferior vena cava is normal in size with <50% respiratory variability, suggesting right atrial pressure of 8 mmHg. 18. While heart rate much improved on this study, side by side comparison shows dilation of the LV and stable severely reduced LV EF. The aortic valve is severely thickened/calcified and visually appears to minimally open. Suspect low flow low gradient  severe AS, as dimensionless index 0.18. Prior MV repair with immobile posterior mitral valve leaflet.  Labs/Other Tests and Data Reviewed:    EKG:  An ECG dated 01/05/19 was personally reviewed today and demonstrated:  afib  Recent Labs: 05/22/2019: ALT 17; B Natriuretic Peptide 3,448.0 05/27/2019: Magnesium 1.7 06/04/2019: Hemoglobin 13.0; Platelets 87 06/25/2019: BUN 63; Creatinine, Ser 1.97; Potassium 4.3; Sodium 142   Recent Lipid Panel Lab Results  Component Value Date/Time   CHOL 193 06/17/2017 07:44 AM   TRIG 114 06/17/2017 07:44 AM   HDL 35 (L) 06/17/2017 07:44 AM   CHOLHDL 5.5 (H) 06/17/2017 07:44 AM   CHOLHDL 2.6 03/16/2016 09:03 AM    LDLCALC 135 (H) 06/17/2017 07:44 AM    Wt Readings from Last 3 Encounters:  07/14/19 159 lb (72.1 kg)  06/25/19 159 lb 4 oz (72.2 kg)  06/04/19 163 lb 6.4 oz (74.1 kg)     Objective:    Vital Signs:  BP (!) 86/56   Pulse 84   Ht _0  (1.727 m)   Wt 159 lb (72.1 kg)   BMI 24.18 kg/m    No physical exam over the phone.  ASSESSMENT & PLAN:    Chronic systolicand diastolicheart failuure:systolic failure diagnosed in 12/2017. Most recent EF <20%. -weight has been stable on current regimen -  reports doing well on torsemide 80 mg dose -continue metoprolol succinate -no room for ACEi/ARB/ARNI/MRA  Atrial Fibrillation: history of difficult to control RVR, was going 160 bpm with minimal exertion when I met him. We have aimed for HR to be <808 and systolic BP to be >811, but this has been intermittently difficult.  -reports that heart rate has been much better post TAVR. Low BP today but this is abnormal for him, and he is asymptomatic -tolerating metoprolol succinate 50 mg daily, increased dose leads to worsening hypotension -not controlled on amiodarone in the past (and with liver/lung pathology, would avoid), no diltiazem given low EF, declines digoxin after shared decision making.  -tolerating apixaban 2.5 mg BID (reduced for age, Cr) -CHA2DS2/VAS Stroke Risk Points=5   sleep apnea events, pulm disease: -follows with pulmonology, stopped Esbriet for ILD/IPF -has Bipap, though has not been using much since TAVR as he feels much better  History of CKD, stage 4  CAD: remote h/o CABG. -no chest pain -on pravastatin (see below), no aspirin given apixaban for afib  Hyperlipidemia: -Goal LDL <70, previously LDL 135 in 2019 (not at goal). Followed through [facility] as well -On pravastatin 80 mg. We have discussed intensifying in the past, but given age/comorbidities/lack of symptoms have left alone.  Mitral Regurgitation: h/o MV repair. Mild-moderate on TAVR TEE  Aortic  Stenosis, severe, s/p recent TAVR: doing the best he has done in a long time. Patient and his wife are thrilled. -has endocarditis prophylaxis ordered  COVID-19 Education: The signs and symptoms of COVID-19 were discussed with the patient and how to seek care for testing (follow up with PCP or arrange E-visit).  The importance of social distancing was discussed today.  Follow up in 3 mos  Time:   Today, I have spent 11 minutes with the patient with telehealth technology discussing the above problems.   Patient Instructions  Medication Instructions:  Your Physician recommend you continue on your current medication as directed.    *If you need a refill on your cardiac medications before your next appointment, please call your pharmacy*   Lab Work: None   Testing/Procedures: None   Follow-Up: At [facility], you and your health needs are our priority.  As part of our continuing mission to provide you with exceptional heart care, we have created designated Provider Care Teams.  These Care Teams include your primary Cardiologist (physician) and Advanced Practice Providers (APPs -  Physician Assistants and Nurse Practitioners) who all work together to provide you with the care you need, when you need it.  We recommend signing up for the patient portal called "MyChart".  Sign up information is provided on this After Visit Summary.  MyChart is used to connect with patients for Virtual Visits (Telemedicine).  Patients are able to view lab/test results, encounter notes, upcoming appointments, etc.  Non-urgent messages can be sent to your provider as well.   To learn more about what you can do with MyChart, go to [email or web address].    Your next appointment:   3 month(s)  The format for your next appointment:   Either In Person or Virtual  Provider:   Buford Dresser, MD       Signed, Buford Dresser, MD  07/14/2019  [place]

## 2019-07-14 NOTE — Patient Instructions (Signed)
Medication Instructions:  Your Physician recommend you continue on your current medication as directed.    *If you need a refill on your cardiac medications before your next appointment, please call your pharmacy*   Lab Work: None   Testing/Procedures: None   Follow-Up: At [facility], you and your health needs are our priority.  As part of our continuing mission to provide you with exceptional heart care, we have created designated Provider Care Teams.  These Care Teams include your primary Cardiologist (physician) and Advanced Practice Providers (APPs -  Physician Assistants and Nurse Practitioners) who all work together to provide you with the care you need, when you need it.  We recommend signing up for the patient portal called "MyChart".  Sign up information is provided on this After Visit Summary.  MyChart is used to connect with patients for Virtual Visits (Telemedicine).  Patients are able to view lab/test results, encounter notes, upcoming appointments, etc.  Non-urgent messages can be sent to your provider as well.   To learn more about what you can do with MyChart, go to [email or web address].    Your next appointment:   3 month(s)  The format for your next appointment:   Either In Person or Virtual  Provider:   Buford Dresser, MD

## 2019-07-21 ENCOUNTER — Other Ambulatory Visit: Payer: Self-pay

## 2019-07-21 ENCOUNTER — Encounter: Payer: Self-pay | Admitting: Internal Medicine

## 2019-07-21 ENCOUNTER — Ambulatory Visit: Payer: PPO | Admitting: Internal Medicine

## 2019-07-21 VITALS — BP 102/60 | HR 101 | Temp 97.8°F | Ht 68.0 in | Wt 167.8 lb

## 2019-07-21 DIAGNOSIS — J84112 Idiopathic pulmonary fibrosis: Secondary | ICD-10-CM | POA: Diagnosis not present

## 2019-07-21 DIAGNOSIS — Z8669 Personal history of other diseases of the nervous system and sense organs: Secondary | ICD-10-CM | POA: Diagnosis not present

## 2019-07-21 DIAGNOSIS — J849 Interstitial pulmonary disease, unspecified: Secondary | ICD-10-CM

## 2019-07-21 NOTE — Patient Instructions (Addendum)
IPF (idiopathic pulmonary fibrosis) ([place])  - need to reassess if there is iLD not. Based on sept 2021 the radiologist is reporting you might not have scar tissue in lung  Plan  - HRCT supine and prone, inspiratory and expiratory volume   - will call with results  History of sleep apnea   - you are reporting swallowing of air  Plan  - direct questions to Dr Ellouise Newer regarding OSA

## 2019-07-21 NOTE — Progress Notes (Signed)
PCP [name], MD   HPI   IOV 09/09/2015  Chief Complaint  Patient presents with  . Advice Only    Referred by Dr. Radford Pax for abnormal PFT.  PFT from 08/31/15 in [place]. Pt has no breathing complaints.     86 year old male referred by Dr. Radford Pax in cardiology for abnormal pulmonary function test. He presents with his wife. At baseline he says he does not have any problems. Only thing was in 2015 he got admitted for weakness according to chart review. At that time he had a CT scan of the chest high resolution that was read by thoracic radiology and the possibility of bilateral bibasal atelectasis versus subtle interstitial lung disease was raised. However he is continued to remain asymptomatic. Sometime a year ago he was started on amiodarone for a few weeks but then most recently a few months ago went back on amiodarone because of atrial fibrillation according to him and chart review and his wife. He subsequently had pulmonary function test Pulmonary function test personally visualized and done on 08/31/2015 was normal except for mild restriction on total lung capacity 5.14 L/74% and slight reduction in DLCO 23.26/75%. Correlating with this is a 2015 CT chest read by thoracic radiologist suggesting possible ILD versus basal atelectasis  Therefore he has been referred here. He is puzzled by the referral because he feels he is asymptomatic. He works out regularly at fitness and feels well.   OV 05/01/2016  Chief Complaint  Patient presents with  . Follow-up    Pt states his SOB is unchanged since last OV. Pt states he is getting over a URI, c/o prod cough with clear mucus. Pt denies SOB and CP/tightness.    [name] seen in the summer of 2017. At that time we did a CT scan of the chest that suggested persistent but stable interstitial lung disease findings compared to November 2015 but did show amio  Liver depisotis. He was asymptomatic at that time. The pain of starting  amiodarone was in the interim. He tells me now that he continues to be asymptomatic. His wife also agrees with him. He works out at at [employer] doing [employer] and aerobics. Last week he did have a respiratory infection and was down in bed for 1 day but says he is back to baseline. However pulmonary function test documented below shows a decline in forced vital capacity and diffusion capacity. I personally reviewed this result and visualized graph. Walking desaturation test 185 feet 3 laps on room air in our office: lowest pulse ox 98% and asymptomatic HR 121   He is off amio for few months per wife and him  ACCP ILD questionnaire done by his wife on his behlf - cough: mild, occ, day time x months. Denies dyspnea - smoking: started cig at age 25 2 cig/da . Quit after 20 years. REports yes for rec drugs nos - family hx lung disease: denies - home exposure - none - ocupationa: farmm work, Administrator, dairy farm, walk[place] - meds: amio hx asbove +   OV 05/25/2016  Chief Complaint  Patient presents with  . Follow-up    pt states he is doing good, he exercises three times a week   Here to discuss ild workup PFT declined sinc June 2017 byut radioloigists feel no change in CT since June 2017 Autoimmune negative He and wife do not want biopsy though indicated    Results for [facility]  [name] (MRN [id number]) as of 05/25/2016 11:02  Ref. Range 05/01/2016 10:59 05/01/2016 10:59  Sed Rate Latest Ref Range: 0 - 20 mm/hr 12   Anit Nuclear Antibody(ANA) Latest Ref Range: NEGATIVE  NEG NEG  Angiotensin-Converting Enzyme Latest Ref Range: 9 - 67 U/L 35   Cyclic Citrullin Peptide Ab Latest Units: Units <16 <16  ds DNA Ab Latest Units: IU/mL <1   RA Latex Turbid. Latest Ref Range: <14 IU/mL <14   Ribonucleic Protein(ENA) Antibody, IgG Latest Ref Range: <1.0 NEG AI  <1.0 NEG   SSA (Ro) (ENA) Antibody, IgG Latest Ref Range: <1.0 NEG AI  <1.0 NEG   SSB (La) (ENA) Antibody, IgG Latest Ref  Range: <1.0 NEG AI  <1.0 NEG   Scleroderma (Scl-70) (ENA) Antibody, IgG Latest Ref Range: <1.0 NEG AI  <1.0 NEG    IMPRESSION: 1. 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 2. Aortic atherosclerosis, in addition to left main and 3 vessel coronary artery disease. Status post median sternotomy for CABG. 3. There are calcifications of the aortic valve. Echocardiographic correlation for evaluation of potential valvular dysfunction may be warranted if clinically indicated. 4. The severity of disease appears essentially Unchanged (compareison June 2017(). Minimal cylindrical traction bronchiectasis is noted in the basal segments of the lower lobes of the lungs bilaterally. A small amount of peripheral bronchiolectasis is also noted. No honeycombing identifi  Electronically Signed   By: Vinnie Langton M.D.   On: 05/15/2016 16:33     OV 11/21/2016  Chief Complaint  Patient presents with  . Follow-up    Pt here after PFT. Pt states his breathing is unchanged since last OV. Pt denies cough, SOB, CP/tightness.    86 year old male with undifferentiated interstitial lung disease stable since 2015. Clinically deemed unrelated to amiodarone taken briefly 2017  Last visit was in March 2018. With his wife. He presents again with his wife. In the interim he continues to exercise daily at [employer]. He lifts weights. In fact is able to lift more weights than before. He does not have any dyspnea of exertion. He does not want to have lung biopsy. He is on basic supportive care with observation therapy. He again declined lung biopsy. In fact T-cell immunity does not want to do a 6 month follow-up and wants to keep an open-ended follow-up. His pulmonary function test today stable. Walking  desaturation test resting pulse ox 96% final pulse ox 97% after 185 feet 3 laps on room air. His heart rate jumped from 91/m 130/m.    OV 01/22/2018  Subjective:  Patient ID: [name], male , DOB: 05/22/1931 , age 26 y.o. , MRN: [id number] , ADDRESS: [address] [place] [address]   01/22/2018 -   Chief Complaint  Patient presents with  . Follow-up   86 year old male with undifferentiated interstitial lung disease stable since 2015. Clinically deemed unrelated to amiodarone taken briefly 2017  HPI Jen Eppinger 86 y.o. -presents for one-year follow-up of his ILD not otherwise specified.  I called him clinically undifferentiated interstitial lung disease with negative autoimmune profile although by age and male gender he should be IPF especially with negative serology.  He tells me for the last few months has had increased shortness of breath on exertion particularly with stairs.  He had an admission for heart failure  according to his history and confirmed on review of the chart in October 2019.  After that he is improved according to his wife but he tells me that he is still more short of breath than baseline.  It is relieved by rest.  There is no associated chest pain.  He had pulmonary function test today that shows a decline in lung function compared to one year showing progressive or suggestive of progressive ILD.  He did have a high-resolution CT chest that is read as indeterminate by 2018 ATS criteria.  According to the radiologist there is no progression.   IMPRESSION: 1. Spectrum of findings in the lungs remains compatible with interstitial lung disease, and appears stable compared to the prior examinations, again favored to represent nonspecific interstitial pneumonia (NSIP); technically classified as ATS indeterminate for usual interstitial pneumonia (UIP). 2. Aortic atherosclerosis, in addition to left main and 3 vessel coronary artery disease. Status post median  sternotomy for CABG. 3. There are calcifications of the aortic valve, mitral annulus and mitral subvalvular apparatus. Echocardiographic correlation for evaluation of potential valvular dysfunction may be warranted if clinically indicated.  Aortic Atherosclerosis (ICD10-I70.0).   Electronically Signed   By: Vinnie Langton M.D.   On: 01/03/2018 10:45  ROS - per HPI  OV 04/17/2018  Subjective:  Patient ID: [name], male , DOB: Aug 07, 1931 , age 34 y.o. , MRN: [id number] , ADDRESS: [address] [place] [address]   86 year old male with undifferentiated interstitial lung disease stable since 2015 - progressed Nov 2019 on PFT (not on CT oct 2019- indetermniate per 2018 ATS criteria). Clinically deemed unrelated to amiodarone taken briefly 2017. IPF clinical dx given 01/22/18    04/17/2018 -   Chief Complaint  Patient presents with  . Follow-up    Pt stated he had problems with the Esbriet to where he became very weak and also lost the appetite and due to the side effects, pt stopped taking it and does not plan on starting back on it. Pt has not taking med in about a month. Pt states his breathing is about the same as last visit and does become SOB with exertion, has an occ cough. Denies any complaints of CP or chest tightness.     HPI [name] 86 y.o. -returns for follow-up of his IPF.  He presents with his wife.  November 2019 I gave him a formal diagnosis of IPF based on clinical grounds and then started him on pirfenidone.  But within a month he lost 14 pounds of weight.  According to his wife at least some of this was due to Lasix.  The weight loss from pirfenidone was due to low appetite.  He also became quite weak and fatigued.  Therefore he does not want to do this drug again.  His last liver function test on  Feb 14 2018 was reviewed and was normal especially the enzymes although bilirubin was 1.4 mg percent.  He has chronic kidney disease with a creatinine  of 2 mg percent and a GFR of 29.  He is currently on anticoagulation.  Because of all this he does not want to do nintedanib at all.  He and his wife wondered about the risk of progression but given the side effect profile and the safety profile they do not want to do any anti-fibrotic's.  However, they are willing to consider research trials but do not have GI side effects or have minimal side effects.  This will be  in contribution to science in the future and he seemed excited about this.  We discussed about 2 potential trials we offer especially in inhaler face to an IV infusion phase 3.  I did agree that we would pre-screen the patient for this in a few months time and discuss this more.       OV 09/11/2018  Subjective:  Patient ID: [name], male , DOB: July 16, 1931 , age 86 y.o. , MRN: [id number] , ADDRESS: [address] [place] [address]   09/11/2018 -  IPF followup on basic supportive care because of intolerance to Esbriet and reluctance to do nintedanib    HPI [name] 86 y.o. -presents for pulmonary fibrosis follow-up.  He has IPF.  He is on basic supportive care.  He was seen last week by nurse practitioner and deemed to be stable.  Nurse practitioner arrange for a follow-up visit when first available which was today a week after the prior visit so he is here.  No interim complaints.  He is frustrated by the quick return in appointment.  He is telling me that his CPAP mask is not working well for him.  I reviewed the chart and found that Dr. Ellouise Newer is the one who prescribes his CPAP.  I have sent a message to Dr. Ellouise Newer.  At this point in time because of the pandemic no research trial of fully recruiting and therefore we will have to reassess this in the future.  His last CT scan was in the fall 2019.    OV 07/21/2019  Subjective:  Patient ID: [name], male , DOB: 19-Apr-1931 , age 25 y.o. , MRN: [id number] , ADDRESS: [address] [place]  [address]   07/21/2019 -   Chief Complaint  Patient presents with  . Follow-up    doing well, had aortic valve replaced, no SOB     undifferentiated interstitial lung disease stable since 2015 - progressed Nov 2019 on PFT (not on CT oct 2019- indetermniate per 2018 ATS criteria). Clinically deemed unrelated to amiodarone taken briefly 2017. IPF clinical dx given 01/22/18.  IPF followup on basic supportive care because of intolerance to Esbriet and reluctance to do nintedanib   HPI [name] 86 y.o. -  nderwent successful TAVR with a43mm Edwards Sapien 3 THV via the TF approach on 05/26/19. ECHO 4/8?21 - > Left ventricular ejection fraction, by estimation, is <20%. The left ventricle has severely decreased function. The left ventricle demonstrates global hypokinesis.  He is now here with his wife.  I last saw him in summer 2020.  After the TAVR procedure his effort tolerance and symptoms of significantly improved.  He is feeling great.  I shared the low ejection fraction results with him and his wife.  His wife is surprised.  Records indicate they have been informed about the low ejection fraction.  He had spirometry and DLCO.  The spirometry shows improvement but the DLCO shows worsening.  I suspect this is a technical issue within the PFT lab.  He uses his BiPAP for sleep apnea.  Wife is reporting aerophagia.  I have asked her to direct questions to Dr. Shelva Majestic.  Overall he is on room air.  He is not going to the gym anymore but exercising based on cardiac rehab protocols.   Noted that in September 2020 he had high-resolution CT chest before his TAVR.  Cardiologist was concerned that this is all CHF and not ILD.  This was the first time I became aware of a radiology report being concerned that he does not have ILD.  I have discussed this to the patient and his wife.  SYMPTOM SCALE - ILD 07/21/2019   O2 use ra  Shortness of Breath 0 -> 5 scale with 5 being worst (score 6 If unable to do)  At  rest 0  Simple tasks - showers, clothes change, eating, shaving 1  Household (dishes, doing bed, laundry) 1  Shopping 3  Walking level at own pace 2  Walking up Stairs 2  Total (30-36) Dyspnea Score 9  How bad is your cough? 0  How bad is your fatigue 2  How bad is nausea 0  How bad is vomiting?  0  How bad is diarrhea? 0  How bad is anxiety? 0  How bad is depression 00         Results for MICO, SPARK (MRN [id number]) as of 09/11/2018 16:27  Ref. Range 08/31/2015 12:32 04/02/2016 10:33 11/21/2016 09:53 01/22/2018 10:28 09/04/2018 08:51 04/21/19  FVC-Pre Latest Units: L 3.24 3.05 3.08 2.55 2.48 2.93  FVC-%Pred-Pre Latest Units: % 85 80 82 68 67 80%  Results for ARGELIO, GRANIER (MRN [id number]) as of 09/11/2018 16:27  Ref. Range 08/31/2015 12:32 04/02/2016 10:33 11/21/2016 09:53 01/22/2018 10:28 09/04/2018 08:51 04/21/19  DLCO cor Latest Units: ml/min/mmHg 23.26 20.49 23.33 21.27  15.32  DLCO cor % pred Latest Units: % 75 66 75 68  66%    Simple office walk 185 feet x  3 laps goal with forehead probe 04/17/2018  07/21/2019   O2 used rooom air ra  Number laps completed 3 3  Comments about pace slow   Resting Pulse Ox/HR 100% and 111/min 99% and 70/min  Final Pulse Ox/HR 98% and 137/min 98% and 105/min  Desaturated </= 88% no no  Desaturated <= 3% points no no  Got Tachycardic >/= 90/min Ytes, even at baseline   Symptoms at end of test no No dyspnea  Miscellaneous comments x x      ROS - per HPI   IMPRESSION: CT cghest sept 2020 1. The appearance the chest is most compatible with mild congestive heart failure, as above. 2. No definite imaging findings to suggest interstitial lung disease. 3. Mild air trapping indicative of small airways disease. 4. Tracheobronchomalacia. 5. New 4 mm mean diameter right upper lobe nodule is nonspecific, but statistically likely benign. No follow-up needed if patient is low-risk. Non-contrast chest CT can be considered in 12 months if patient is  high-risk. This recommendation follows the consensus statement: Guidelines for Management of Incidental Pulmonary Nodules Detected on CT Images: From the Fleischner Society 2017; Radiology 2017; 284:228-243. 6. Aortic atherosclerosis, in addition to left main and 3 vessel coronary artery disease. Status post median sternotomy for CABG.  Aortic Atherosclerosis (ICD10-I70.0).   Electronically Signed   By: Vinnie Langton M.D.   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   reports that he quit smoking about 64 years ago. His smoking use included cigarettes. He  has a 0.20 pack-year smoking history. He has never used smokeless tobacco.  Past Surgical History:  Procedure Laterality Date  . CATARACT EXTRACTION W/ INTRAOCULAR LENS  IMPLANT, BILATERAL     per patient about 4 years ago  . CORONARY ARTERY BYPASS GRAFT    . INTRAOPERATIVE TRANSTHORACIC ECHOCARDIOGRAM N/A 05/26/2019   Procedure: INTRAOPERATIVE TRANSTHORACIC ECHOCARDIOGRAM;  Surgeon: Sherren Mocha, MD;  Location: [facility] [facility];  Service: Open Heart Surgery;  Laterality: N/A;  . MITRAL VALVE REPAIR    . RIGHT/LEFT HEART CATH AND CORONARY/GRAFT ANGIOGRAPHY N/A 04/28/2019   Procedure: RIGHT/LEFT HEART CATH AND CORONARY/GRAFT ANGIOGRAPHY;  Surgeon: Sherren Mocha, MD;  Location: [place] CV LAB;  Service: Cardiovascular;  Laterality: N/A;  .  TEE WITHOUT CARDIOVERSION N/A 05/20/2019   Procedure: TRANSESOPHAGEAL ECHOCARDIOGRAM (TEE);  Surgeon: Donato Heinz, MD;  Location: [facility] ENDOSCOPY;  Service: Cardiovascular;  Laterality: N/A;  . TRANSCATHETER AORTIC VALVE REPLACEMENT, TRANSFEMORAL N/A 05/26/2019   Procedure: TRANSCATHETER AORTIC VALVE REPLACEMENT, TRANSFEMORAL;  Surgeon: Sherren Mocha, MD;  Location: [place];  Service: Open Heart Surgery;  Laterality: N/A;    Allergies  Allergen Reactions  . Asa [Aspirin] Other (See Comments)    Bleeding- has internal hemorrhoids  . Flomax [Tamsulosin] Palpitations and Other (See Comments)    Made his heart go out of rhythm  . Pirfenidone     Weight loss, loss of appetite.    Immunization History  Administered Date(s) Administered  . Influenza Split 02/09/2015  . Influenza, High Dose Seasonal PF 12/18/2015, 12/31/2017  . Pneumococcal Conjugate-13 03/20/2015  . Pneumococcal Polysaccharide-23 12/17/2000    Family History  Problem Relation Age of Onset  . Stroke Mother        Deceased  . Heart disease Father        Deceased  . Heart failure Brother        Deceased  . Stroke Brother      Current Outpatient Medications:  .  amoxicillin (AMOXIL) 500 MG tablet, Take 2,000 mg by mouth See admin instructions. Take 2000 mg by mouth one hour prior to dental visits , Disp: , Rfl:  .  apixaban (ELIQUIS) 2.5 MG TABS tablet, Take 1 tablet (2.5 mg total) by mouth 2 (two) times daily., Disp: 180 tablet, Rfl: 3 .  Cholecalciferol (VITAMIN D-3) 1000 units CAPS, Take 1,000 Units by mouth daily. , Disp: , Rfl:  .  docusate sodium (COLACE) 100 MG capsule, Take 100 mg by mouth 2 (two) times daily., Disp: , Rfl:  .  feeding supplement, ENSURE ENLIVE, (ENSURE ENLIVE) LIQD, Take 237 mLs by mouth 2 (two) times daily between meals., Disp: 237 mL, Rfl: 12 .  hydrocortisone 2.5 % cream, Apply topically as needed. , Disp: , Rfl:  .  levothyroxine (SYNTHROID) 75 MCG tablet, Take 75 mcg by mouth daily  before breakfast. , Disp: , Rfl:  .  metoprolol succinate (TOPROL-XL) 50 MG 24 hr tablet, TAKE ONE TABLET BY MOUTH ONE TIME DAILY , Disp: 90 tablet, Rfl: 0 .  mirtazapine (REMERON) 15 MG tablet, Take 0.5 tablets (7.5 mg total) by mouth at bedtime., Disp: , Rfl:  .  Polyethyl Glycol-Propyl Glycol (SYSTANE OP), Place 1 drop into both eyes daily as needed (dry eyes)., Disp: , Rfl:  .  potassium chloride SA (KLOR-CON) 20 MEQ tablet, Take 1 tablet (20 mEq total) by mouth daily., Disp: 90 tablet, Rfl: 3 .  pravastatin (PRAVACHOL) 80 MG tablet, Take 80 mg by mouth every Monday, Wednesday, and Friday. , Disp: , Rfl:  .  torsemide (DEMADEX) 20 MG tablet, Take 60 mg by mouth daily. 2 tablet in the morning and 1 tablet at night, Disp: , Rfl:  .  vitamin B-12 (CYANOCOBALAMIN) 1000 MCG tablet, Take 1,000 mcg by mouth daily., Disp: , Rfl:       Objective:   Vitals:   07/21/19 0917  BP: 102/60  Pulse: (!) 101  Temp: 97.8 F (36.6 C)  TempSrc: Temporal  SpO2: 99%  Weight: 167 lb 12.8 oz (76.1 kg)  Height: 5\' 8"  (1.727 m)    Estimated body mass index is 25.51 kg/m as calculated from the following:   Height as of this encounter: 5\' 8"  (1.727 m).   Weight as of this encounter: 167 lb 12.8 oz (76.1 kg).  @WEIGHTCHANGE @  [employer]   07/21/19 0917  Weight: 167 lb 12.8 oz (76.1 kg)     Physical Exam He looks like he has lost weight compared to the past but still very lean and fit.  He is hard of hearing and has hearing aids.  This time I am not able to hear any crackles normal heart sounds.  Abdomen is soft no pedal edema no cyanosis. Assessment:       ICD-10-CM   1. IPF (idiopathic pulmonary fibrosis) ([place])  J84.112   2. History of sleep apnea  Z86.69        Plan:     Patient Instructions  IPF (idiopathic pulmonary fibrosis) ([place])  - need to reassess if there is iLD not. Based on sept 2021 the radiologist is reporting you might not have scar tissue in lung  Plan  - HRCT supine  and prone, inspiratory and expiratory volume   - will call with results  History of sleep apnea   - you are reporting swallowing of air  Plan  - direct questions to Dr Ellouise Newer regarding OSA      SIGNATURE    Dr. Brand Males, M.D., F.C.C.P,  Pulmonary and Critical Care Medicine Staff Physician, [place] Director - Interstitial Lung Disease  Program  Pulmonary [place] at [place], [place], [address]  Pager: [phone], If no answer or between  15:00h - 7:00h: call [phone] Telephone: [phone]  10:12 AM 07/21/2019

## 2019-07-30 ENCOUNTER — Other Ambulatory Visit: Payer: PPO

## 2019-07-31 ENCOUNTER — Other Ambulatory Visit: Payer: Self-pay

## 2019-07-31 ENCOUNTER — Ambulatory Visit (INDEPENDENT_AMBULATORY_CARE_PROVIDER_SITE_OTHER)
Admission: RE | Admit: 2019-07-31 | Discharge: 2019-07-31 | Disposition: A | Discharge status: Home / Self Care | Payer: PPO | Source: Ambulatory Visit | Attending: Internal Medicine | Admitting: Internal Medicine

## 2019-07-31 DIAGNOSIS — J849 Interstitial pulmonary disease, unspecified: Secondary | ICD-10-CM | POA: Diagnosis not present

## 2019-07-31 DIAGNOSIS — R911 Solitary pulmonary nodule: Secondary | ICD-10-CM | POA: Diagnosis not present

## 2019-08-07 ENCOUNTER — Telehealth: Payer: Self-pay | Admitting: Internal Medicine

## 2019-08-07 DIAGNOSIS — R918 Other nonspecific abnormal finding of lung field: Secondary | ICD-10-CM

## 2019-08-07 NOTE — Telephone Encounter (Signed)
RUL nodule now 1cm and growing fast in 6-9 months on CT ->  ILD + but no change  Plan  - get super D of 07/31/19 scan  - do PET scan - set up appt with [facility]/Icard next few weeks but after PET scan -return to see me in 2-3 months with spiro/dlco - right now nodule is high prioroty    IMPRESSION: CT CHEST 1. Peripheral right upper lobe 10 x 5 mm solid pulmonary nodule is slightly increased from 9 x 5 mm on 05/13/2019 CT and is clearly increased from 5 x 3 mm on 12/09/2018 CT. Primary bronchogenic carcinoma not excluded. Consider PET-CT for further evaluation versus continued close chest CT surveillance in 3 months. 2. Spectrum of findings suggestive of basilar predominant interstitial lung disease without significant bronchiectasis or honeycombing, not clearly changed in the interval, see comments. Differential includes nonspecific interstitial pneumonia (NSIP) or usual interstitial pneumonia (UIP). Amiodarone pulmonary toxicity is on the differential. Findings are indeterminate for UIP per consensus guidelines: Diagnosis of Idiopathic Pulmonary Fibrosis: An Official ATS/ERS/JRS/ALAT Clinical Practice Guideline. [place], Iss 5, [phone], Nov 17 2016. 3. Mild cardiomegaly. 4. Aortic Atherosclerosis (ICD10-I70.0).   Electronically Signed   By: Ilona Sorrel M.D.   On: 07/31/2019 17:35

## 2019-08-11 NOTE — Telephone Encounter (Signed)
Called [facility] and spoke with Anabel Halon letting her know that we needed to get pt's CT from 5/14 made into a super D CT. She stated that she will have to get with Lattie Haw on this as she is still learning. Stated to her to contact us back once this has been done so we can be on the lookout for the disc and she verbalized understanding.   Called and spoke with pt's wife letting her know the results of the CT and that MR wanted pt to see either Dr. Valeta Harms Dr. Lamonte Sakai to further review and also that we were going to have pt obtain a PET prior and she verbalized understanding. Order has been placed for the PET and pt has been scheduled for an appt with Dr. Valeta Harms 6/16.  Nothing further needed.

## 2019-08-20 DIAGNOSIS — I5030 Unspecified diastolic (congestive) heart failure: Secondary | ICD-10-CM | POA: Diagnosis not present

## 2019-08-20 DIAGNOSIS — G4700 Insomnia, unspecified: Secondary | ICD-10-CM | POA: Diagnosis not present

## 2019-08-20 DIAGNOSIS — E7800 Pure hypercholesterolemia, unspecified: Secondary | ICD-10-CM | POA: Diagnosis not present

## 2019-08-20 DIAGNOSIS — N183 Chronic kidney disease, stage 3 unspecified: Secondary | ICD-10-CM | POA: Diagnosis not present

## 2019-08-20 DIAGNOSIS — E039 Hypothyroidism, unspecified: Secondary | ICD-10-CM | POA: Diagnosis not present

## 2019-08-20 DIAGNOSIS — I2510 Atherosclerotic heart disease of native coronary artery without angina pectoris: Secondary | ICD-10-CM | POA: Diagnosis not present

## 2019-08-20 DIAGNOSIS — I501 Left ventricular failure: Secondary | ICD-10-CM | POA: Diagnosis not present

## 2019-08-20 DIAGNOSIS — I10 Essential (primary) hypertension: Secondary | ICD-10-CM | POA: Diagnosis not present

## 2019-08-20 DIAGNOSIS — I5022 Chronic systolic (congestive) heart failure: Secondary | ICD-10-CM | POA: Diagnosis not present

## 2019-08-20 DIAGNOSIS — I480 Paroxysmal atrial fibrillation: Secondary | ICD-10-CM | POA: Diagnosis not present

## 2019-08-21 ENCOUNTER — Ambulatory Visit (HOSPITAL_COMMUNITY)
Admission: RE | Admit: 2019-08-21 | Disposition: A | Discharge status: Home / Self Care | Payer: PPO | Source: Ambulatory Visit

## 2019-08-21 ENCOUNTER — Telehealth: Payer: Self-pay | Admitting: Internal Medicine

## 2019-08-21 NOTE — Telephone Encounter (Signed)
Spoke with the pt's spouse  She states that the pt ate this am and then went to have PET  He was supposed to fast  They rescheduled him to 09/01/19 Nothing further needed

## 2019-09-01 ENCOUNTER — Other Ambulatory Visit: Payer: Self-pay

## 2019-09-01 ENCOUNTER — Telehealth: Payer: Self-pay | Admitting: Internal Medicine

## 2019-09-01 ENCOUNTER — Ambulatory Visit (HOSPITAL_COMMUNITY)
Admission: RE | Admit: 2019-09-01 | Discharge: 2019-09-01 | Disposition: A | Discharge status: Home / Self Care | Payer: PPO | Source: Ambulatory Visit | Attending: Internal Medicine | Admitting: Internal Medicine

## 2019-09-01 DIAGNOSIS — R911 Solitary pulmonary nodule: Secondary | ICD-10-CM | POA: Diagnosis not present

## 2019-09-01 DIAGNOSIS — M47812 Spondylosis without myelopathy or radiculopathy, cervical region: Secondary | ICD-10-CM | POA: Diagnosis not present

## 2019-09-01 DIAGNOSIS — I700 Atherosclerosis of aorta: Secondary | ICD-10-CM | POA: Insufficient documentation

## 2019-09-01 DIAGNOSIS — M47816 Spondylosis without myelopathy or radiculopathy, lumbar region: Secondary | ICD-10-CM | POA: Insufficient documentation

## 2019-09-01 DIAGNOSIS — Z951 Presence of aortocoronary bypass graft: Secondary | ICD-10-CM | POA: Diagnosis not present

## 2019-09-01 DIAGNOSIS — I517 Cardiomegaly: Secondary | ICD-10-CM | POA: Diagnosis not present

## 2019-09-01 DIAGNOSIS — I2510 Atherosclerotic heart disease of native coronary artery without angina pectoris: Secondary | ICD-10-CM | POA: Diagnosis not present

## 2019-09-01 DIAGNOSIS — Z952 Presence of prosthetic heart valve: Secondary | ICD-10-CM | POA: Diagnosis not present

## 2019-09-01 DIAGNOSIS — K5730 Diverticulosis of large intestine without perforation or abscess without bleeding: Secondary | ICD-10-CM | POA: Insufficient documentation

## 2019-09-01 DIAGNOSIS — R918 Other nonspecific abnormal finding of lung field: Secondary | ICD-10-CM | POA: Diagnosis not present

## 2019-09-01 LAB — GLUCOSE, CAPILLARY: Glucose-Capillary: 112 mg/dL — ABNORMAL HIGH (ref 70–99)

## 2019-09-01 MED ORDER — FLUDEOXYGLUCOSE F - 18 (FDG) INJECTION
8.3000 | Freq: Once | INTRAVENOUS | Status: AC | PRN
  Administered 2019-09-01: 8.3 via INTRAVENOUS

## 2019-09-01 NOTE — Telephone Encounter (Signed)
PET scn shows lung nodule hot  Plan per prior note  - supposed to [facility] for eval of bx  - supposed to have had SuperD of the recent HRCT and then see [facility]   However, has visit with Volanda Napoleon the APP. This is fine to share results but important see [facility]

## 2019-09-01 NOTE — Telephone Encounter (Signed)
[facility] next available for nodule pt 09/30/19 is this okay?

## 2019-09-02 ENCOUNTER — Ambulatory Visit: Payer: PPO | Admitting: Pulmonary Disease

## 2019-09-02 ENCOUNTER — Ambulatory Visit: Payer: PPO | Admitting: Primary Care

## 2019-09-02 ENCOUNTER — Other Ambulatory Visit: Payer: Self-pay

## 2019-09-02 ENCOUNTER — Encounter: Payer: Self-pay | Admitting: Primary Care

## 2019-09-02 DIAGNOSIS — R911 Solitary pulmonary nodule: Secondary | ICD-10-CM

## 2019-09-02 DIAGNOSIS — K5730 Diverticulosis of large intestine without perforation or abscess without bleeding: Secondary | ICD-10-CM | POA: Insufficient documentation

## 2019-09-02 NOTE — Telephone Encounter (Signed)
If I have any nodule slots blocked, Ok to put this pt in one of them. Thanks.

## 2019-09-02 NOTE — Progress Notes (Signed)
@Patient  ID: [name], male    DOB: 1931-05-05, 86 y.o.   MRN: [id number]  Chief Complaint  Patient presents with  . Follow-up    pt is here to discuss pet scan    Referring provider: [name], MD  HPI: 86 year old male, former light smoker quit 1957 (2-pack-year history).  Past medical history significant for IPF.  Patient of Dr. Chase Caller, recently seen on 07/21/2019.  Ordered for HRCT to assess for these, based on September 2021 CT scan radiologist reported that there may not be scar tissue in the lung.  09/02/2019 Patient presents today for follow-up to review PET scan results. Accompanied by his wife, who does most of the talking for him/manages care. He had an HRCT on 07/31/2019 which showed peripheral right upper lobe solid pulmonary nodule measuring 10 x 5 mm, slightly increased from CT scan in February 2021.  Primary bronchogenic carcinoma not excluded.  Patient ordered for PET scan which was completed yesterday which showed 1.0 x 0.5 cm right upper lobe nodule with a SUV of 3.8 compatible with malignancy.  No findings of hypermetabolic adenopathy or distant spread.  Wife states that he has no issues with his breathing. He has hx severe OSA, BIPAP managed by Dr. Claiborne Billings. He has been sleeping very well at night without his BIPAP. He has lost 20-30 lbs since March after TAVR procedure. Discussed PET scan results with patient, likely will need bronchoscopy for tissue biopsy. Denies fever, hemoptysis, shortness of breath, chest tightness or pain, N/V/D.   Allergies  Allergen Reactions  . Asa [Aspirin] Other (See Comments)    Bleeding- has internal hemorrhoids  . Flomax [Tamsulosin] Palpitations and Other (See Comments)    Made his heart go out of rhythm  . Pirfenidone     Weight loss, loss of appetite.    Immunization History  Administered Date(s) Administered  . Influenza Split 02/09/2015  . Influenza, High Dose Seasonal PF 12/18/2015, 12/31/2017  .  Influenza-Unspecified 01/02/2019  . Moderna SARS-COVID-2 Vaccination 06/15/2019, 07/08/2019  . Pneumococcal Conjugate-13 03/20/2015  . Pneumococcal Polysaccharide-23 12/17/2000    Past Medical History:  Diagnosis Date  . Adenomatous colon polyp   . CAD (coronary artery disease)    a. S/P CABG x 1 @ time of MV annuloplasty;  b. 02/2010 ETT: neg for ischemia.  . Carotid artery stenosis    1-39% by dopplers 10/2016  . Chronic combined systolic (congestive) and diastolic (congestive) heart failure ([place])   . CKD (chronic kidney disease), stage III   . Current use of long term anticoagulation    a. Coumadin in setting of afib.  . Diverticulosis   . Dyslipidemia   . Essential hypertension   . H/O mitral valve repair 09/16/2002   Complex valvuloplasty including quadrangular resection of posterior leaflet with artificial Gore-tex neochords and 28 mm Seguin ring annuloplasty - Dr Servando Snare  . Hemorrhoids   . Hyperlipidemia   . Hypertension   . Permanent atrial fibrillation (HCC)    a. CHA2DS2VASc = 6 -->chronic coumadin;  b. Prev on amio-->d/c 2/2 hypothyroidism. >> resumed 5/16. c. Notes from 2017 indicate interstitial lung disease by pulm appt, question amio toxicity, also increased liver attenuation. Rate control pursued and amiodarone discontinued.  . S/P CABG x 1 09/16/2002   SVG to OM  . 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 Sleep apnea 12/2018  per patient's spouse wears BIPAP "every other night or so"  . Thrombocytopenia ([place])   . TIA (transient ischemic attack)     Tobacco History: Social History   Tobacco Use  Smoking Status Former Smoker  . Packs/day: 0.10  . Years: 2.00  . Pack years: 0.20  . Types: Cigarettes  . Quit date: 03/20/1955  . Years since quitting: 64.4  Smokeless Tobacco Never Used   Counseling given: Not Answered   Outpatient Medications Prior  to Visit  Medication Sig Dispense Refill  . apixaban (ELIQUIS) 2.5 MG TABS tablet Take 1 tablet (2.5 mg total) by mouth 2 (two) times daily. 180 tablet 3  . Cholecalciferol (VITAMIN D-3) 1000 units CAPS Take 1,000 Units by mouth daily.     Marland Kitchen docusate sodium (COLACE) 100 MG capsule Take 100 mg by mouth 2 (two) times daily.    . feeding supplement, ENSURE ENLIVE, (ENSURE ENLIVE) LIQD Take 237 mLs by mouth 2 (two) times daily between meals. 237 mL 12  . hydrocortisone 2.5 % cream Apply topically as needed.     Marland Kitchen levothyroxine (SYNTHROID) 75 MCG tablet Take 75 mcg by mouth daily before breakfast.     . metoprolol succinate (TOPROL-XL) 50 MG 24 hr tablet TAKE ONE TABLET BY MOUTH ONE TIME DAILY  90 tablet 0  . mirtazapine (REMERON) 15 MG tablet Take 0.5 tablets (7.5 mg total) by mouth at bedtime.    Vladimir Faster Glycol-Propyl Glycol (SYSTANE OP) Place 1 drop into both eyes daily as needed (dry eyes).    . potassium chloride SA (KLOR-CON) 20 MEQ tablet Take 1 tablet (20 mEq total) by mouth daily. 90 tablet 3  . pravastatin (PRAVACHOL) 80 MG tablet Take 80 mg by mouth every Monday, Wednesday, and Friday.     . torsemide (DEMADEX) 20 MG tablet Take 60 mg by mouth daily. 2 tablet in the morning and 1 tablet at night    . vitamin B-12 (CYANOCOBALAMIN) 1000 MCG tablet Take 1,000 mcg by mouth daily.    Marland Kitchen amoxicillin (AMOXIL) 500 MG tablet Take 2,000 mg by mouth See admin instructions. Take 2000 mg by mouth one hour prior to dental visits      No facility-administered medications prior to visit.   Review of Systems  Review of Systems  Constitutional: Positive for unexpected weight change.  Respiratory: Negative for cough, shortness of breath and wheezing.   Psychiatric/Behavioral: Negative for sleep disturbance.   Physical Exam  BP 120/78 (BP Location: Left Arm, Cuff Size: Normal)   Pulse 80   Temp 97.8 F (36.6 C) (Oral)   Ht 5\' 8"  (1.727 m)   Wt 170 lb (77.1 kg)   SpO2 92%   BMI 25.85 kg/m    Physical Exam Constitutional:      General: He is not in acute distress.    Appearance: Normal appearance. He is normal weight. He is not ill-appearing.  HENT:     Head: Normocephalic and atraumatic.     Mouth/Throat:     Mouth: Mucous membranes are moist.     Pharynx: Oropharynx is clear.  Cardiovascular:     Rate and Rhythm: Normal rate and regular rhythm.     Comments: RRR Pulmonary:     Effort: Pulmonary effort is normal.     Breath sounds: Normal breath sounds.     Comments: CTA Skin:    General: Skin is warm and dry.  Neurological:     General: No focal deficit present.     Mental  Status: He is alert and oriented to person, place, and time. Mental status is at baseline.  Psychiatric:        Mood and Affect: Mood normal.        Behavior: Behavior normal.        Thought Content: Thought content normal.        Judgment: Judgment normal.      Lab Results:  CBC    Component Value Date/Time   WBC 6.2 06/04/2019 1405   WBC 7.0 05/28/2019 0303   RBC 4.17 06/04/2019 1405   RBC 4.04 (L) 05/28/2019 0303   HGB 13.0 06/04/2019 1405   HCT 40.3 06/04/2019 1405   PLT 87 (LL) 06/04/2019 1405   MCV 97 06/04/2019 1405   MCH 31.2 06/04/2019 1405   MCH 31.2 05/28/2019 0303   MCHC 32.3 06/04/2019 1405   MCHC 32.8 05/28/2019 0303   RDW 13.6 06/04/2019 1405   LYMPHSABS 0.7 04/24/2019 1303   MONOABS 0.6 02/14/2018 1752   EOSABS 0.2 04/24/2019 1303   BASOSABS 0.0 04/24/2019 1303    BMET    Component Value Date/Time   NA 142 06/25/2019 1531   K 4.3 06/25/2019 1531   CL 99 06/25/2019 1531   CO2 24 06/25/2019 1531   GLUCOSE 84 06/25/2019 1531   GLUCOSE 118 (H) 05/28/2019 0303   BUN 63 (H) 06/25/2019 1531   CREATININE 1.97 (H) 06/25/2019 1531   CREATININE 1.80 (H) 12/23/2015 0742   CALCIUM 9.8 06/25/2019 1531   GFRNONAA 30 (L) 06/25/2019 1531   GFRAA 35 (L) 06/25/2019 1531    BNP    Component Value Date/Time   BNP 3,448.0 (H) 05/22/2019 1255    ProBNP     Component Value Date/Time   PROBNP 10,355 (H) 12/24/2017 1159   PROBNP 198.9 02/06/2014 1800    Imaging: NM PET Image Initial (PI) Skull Base To Thigh  Result Date: 09/01/2019 CLINICAL DATA:  Initial treatment strategy for pulmonary nodule. EXAM: NUCLEAR MEDICINE PET SKULL BASE TO THIGH TECHNIQUE: 8.3 mCi F-18 FDG was injected intravenously. Full-ring PET imaging was performed from the skull base to thigh after the radiotracer. CT data was obtained and used for attenuation correction and anatomic localization. Fasting blood glucose: 112 mg/dl COMPARISON:  CT chest 07/31/2019 FINDINGS: Mediastinal blood pool activity: SUV max 2.5 Liver activity: SUV max N/A NECK: No significant abnormal hypermetabolic activity in this region. Incidental CT findings: Bilateral common carotid atherosclerotic calcification. CHEST: The right upper lobe nodule of concern measures 1.0 by 0.5 cm on image 25/8 and has a maximum SUV of 3.8, compatible with malignancy. Incidental CT findings: Prosthetic aortic valve. Coronary, aortic arch, and branch vessel atherosclerotic vascular disease. Prior CABG. Mild to moderate cardiomegaly. ABDOMEN/PELVIS: No significant abnormal hypermetabolic activity in this region. Incidental CT findings: Aortoiliac atherosclerotic vascular disease. Mild proximal sigmoid colon diverticulosis. SKELETON: Mildly accentuated activity along Schmorl's nodes in the lumbar spine. Incidental CT findings: Lumbar spondylosis.  Cervical spondylosis. IMPRESSION: 1. The 1.0 by 0.5 cm right upper lobe nodule has a maximum SUV of 3.8, compatible with malignancy. No findings of hypermetabolic adenopathy or distant spread. 2. Other imaging findings of potential clinical significance: Aortic Atherosclerosis (ICD10-I70.0). Coronary atherosclerosis. Mild to moderate cardiomegaly. Aortic valve prosthesis. Prior CABG. Mild proximal sigmoid colon diverticulosis. Lumbar and cervical spondylosis. Electronically Signed   By:  Van Clines M.D.   On: 09/01/2019 09:35     Assessment & Plan:   Solitary pulmonary nodule - Former light smoker, quit in 1957  -  HRCT on 07/31/2019 which showed peripheral RUL solid pulmonary nodule measuring 10 x 5 mm, which has increased in size (previously 9 x 5 mm on 05/13/2019 CT and 5 x 3 mm on 12/09/2018 CT) - PET scan on 09/02/19 showed 1.0 x 0.5 cm RUL nodule with a SUV of 3.8 compatible with malignancy. No findings of hypermetabolic adenopathy or distant spread. - Will see if we can convert HRCT from May to Super-D image  - Scheduled for apt with Dr. Lamonte Sakai on July 28th    Diverticulosis of colon - Seen on PET scan, no evidence of infection  - Patient denies abdominal pain, N/V/D   Martyn Ehrich, NP 09/02/2019

## 2019-09-02 NOTE — Telephone Encounter (Signed)
7/14 is no longer available. Left message for patient to call back.

## 2019-09-02 NOTE — Patient Instructions (Addendum)
PET scan showed1.0 x 0.5 cm right upper lobe nodule with a SUV of 3.8 compatible with malignancy.   No findings of hypermetabolic adenopathy or distant spread.  Orders: Please have HRCT converted to Super D  Follow-up: Needs to see Dr. Valeta Harms or Lamonte Sakai next available for discuss bronchoscopy for biopsy of nodule    Pulmonary Nodule A pulmonary nodule is a small, round growth of tissue in the lung. It is sometimes referred to as a "shadow" or "spot on the lung." Nodules range in size from less than 1/5 of an inch (4 mm) to a little bigger than an inch (30 mm). Pulmonary nodules can be either noncancerous (benign) or cancerous (malignant). Most are noncancerous. Smaller nodules in people who do not smoke and do not have any other risk factors for lung cancer are more likely to be noncancerous. Larger, irregular nodules in people who smoke or who have a strong family history of lung cancer are more likely to be cancerous. What are the causes? This condition may be caused by:  A bacterial, fungal, or viral infection, such as tuberculosis. The infection is usually an old and inactive one.  A noncancerous mass of tissue.  Inflammation from conditions such as rheumatoid arthritis.  Abnormal blood vessels in the lungs.  Cancerous tissue, such as lung cancer or a cancer in another part of the body that has spread to the lung. What are the signs or symptoms? This condition usually does not cause symptoms. If symptoms appear, they are usually related to the underlying cause. For example, if the condition is caused by an infection, you may have a cough or fever. How is this diagnosed? This condition is usually diagnosed with an X-ray or CT scan. To help determine whether a pulmonary nodule is benign or malignant, your health care provider will:  Take your medical history.  Perform a physical exam.  Order tests, including: ? Blood tests. ? A skin test called a tuberculin test. This test is  done to check if you have been exposed to the germ that causes tuberculosis. ? Chest X-rays. ? A CT scan. This test shows smaller pulmonary nodules more clearly and with more detail than an X-ray. ? A positron emission tomography (PET) scan. This test is done to check if the nodule is cancerous. During the test, a safe amount of a radioactive substance is injected into the bloodstream. Then a picture is taken. ? Biopsy. In this test, a tiny piece of the pulmonary nodule is removed and then examined under a microscope. How is this treated? Treatment for this condition depends on whether the pulmonary nodule is malignant or benign as well as your risk of getting cancer.  Noncancerous nodules usually do not need to be treated, but they may need to be monitored with CT scans. If a CT scan shows that the pulmonary nodule got bigger, more tests may be done.  Some nodules need to be removed. If this is the case, you may have a procedure called a thoractomy. During the procedure, your health care provider will make an incision in your chest and remove the part of the lung where the nodule is located. Follow these instructions at home:   Take over-the-counter and prescription medicines only as told by your health care provider.  Do not use any products that contain nicotine or tobacco, such as cigarettes and e-cigarettes. If you need help quitting, ask your health care provider.  Keep all follow-up visits as told by your  health care provider. This is important. Contact a health care provider if:  You have trouble breathing when you are active.  You feel sick or unusually tired.  You do not feel like eating.  You lose weight without trying.  You develop chills or night sweats. Get help right away if:  You cannot catch your breath.  You begin wheezing.  You cannot stop coughing.  You cough up blood.  You become dizzy or feel like you are going to faint.  You have sudden chest  pain.  You have a fever or persistent symptoms for more than 2-3 days.  You have a fever and your symptoms suddenly get worse. Summary  A pulmonary nodule is a small, round growth of tissue in the lung. Most pulmonary nodules are noncancerous.  This condition is usually diagnosed with an X-ray or CT scan.  Common causes of pulmonary nodules include infection, inflammation, and noncancerous growths.  Though less common, if a nodule is found to be cancerous, you will need specific diagnostic tests and treatment options as directed by your medical provider.  Treatment for this condition depends on whether the pulmonary nodule is benign or malignant as well as your risk of getting cancer. This information is not intended to replace advice given to you by your health care provider. Make sure you discuss any questions you have with your health care provider. Document Revised: 03/29/2017 Document Reviewed: 04/03/2016 Elsevier Patient Education  [place].   Diverticulosis  Diverticulosis is a condition that develops when small pouches (diverticula) form in the wall of the large intestine (colon). The colon is where water is absorbed and stool (feces) is formed. The pouches form when the inside layer of the colon pushes through weak spots in the outer layers of the colon. You may have a few pouches or many of them. The pouches usually do not cause problems unless they become inflamed or infected. When this happens, the condition is called diverticulitis. What are the causes? The cause of this condition is not known. What increases the risk? The following factors may make you more likely to develop this condition:  Being older than age 56. Your risk for this condition increases with age. Diverticulosis is rare among people younger than age 76. By age 1, many people have it.  Eating a low-fiber diet.  Having frequent constipation.  Being overweight.  Not getting enough  exercise.  Smoking.  Taking over-the-counter pain medicines, like aspirin and ibuprofen.  Having a family history of diverticulosis. What are the signs or symptoms? In most people, there are no symptoms of this condition. If you do have symptoms, they may include:  Bloating.  Cramps in the abdomen.  Constipation or diarrhea.  Pain in the lower left side of the abdomen. How is this diagnosed? Because diverticulosis usually has no symptoms, it is most often diagnosed during an exam for other colon problems. The condition may be diagnosed by:  Using a flexible scope to examine the colon (colonoscopy).  Taking an X-ray of the colon after dye has been put into the colon (barium enema).  Having a CT scan. How is this treated? You may not need treatment for this condition. Your health care provider may recommend treatment to prevent problems. You may need treatment if you have symptoms or if you previously had diverticulitis. Treatment may include:  Eating a high-fiber diet.  Taking a fiber supplement.  Taking a live bacteria supplement (probiotic).  Taking medicine to relax your  colon. Follow these instructions at home: Medicines  Take over-the-counter and prescription medicines only as told by your health care provider.  If told by your health care provider, take a fiber supplement or probiotic. Constipation prevention Your condition may cause constipation. To prevent or treat constipation, you may need to:  Drink enough fluid to keep your urine pale yellow.  Take over-the-counter or prescription medicines.  Eat foods that are high in fiber, such as beans, whole grains, and fresh fruits and vegetables.  Limit foods that are high in fat and processed sugars, such as fried or sweet foods.  General instructions  Try not to strain when you have a bowel movement.  Keep all follow-up visits as told by your health care provider. This is important. Contact a health care  provider if you:  Have pain in your abdomen.  Have bloating.  Have cramps.  Have not had a bowel movement in 3 days. Get help right away if:  Your pain gets worse.  Your bloating becomes very bad.  You have a fever or chills, and your symptoms suddenly get worse.  You vomit.  You have bowel movements that are bloody or black.  You have bleeding from your rectum. Summary  Diverticulosis is a condition that develops when small pouches (diverticula) form in the wall of the large intestine (colon).  You may have a few pouches or many of them.  This condition is most often diagnosed during an exam for other colon problems.  Treatment may include increasing the fiber in your diet, taking supplements, or taking medicines. This information is not intended to replace advice given to you by your health care provider. Make sure you discuss any questions you have with your health care provider. Document Revised: 10/02/2018 Document Reviewed: 10/02/2018 Elsevier Patient Education  [place].

## 2019-09-02 NOTE — Assessment & Plan Note (Addendum)
-   Seen on PET scan, no evidence of infection  - Patient denies abdominal pain, N/V/D

## 2019-09-02 NOTE — Telephone Encounter (Signed)
You don't have any open slots until 10/13/19.

## 2019-09-02 NOTE — Telephone Encounter (Signed)
Rob: can you see him earlier than end July 2021?

## 2019-09-02 NOTE — Telephone Encounter (Signed)
Apt scheduled for July 28th with Dr. Lamonte Sakai

## 2019-09-02 NOTE — Assessment & Plan Note (Addendum)
-   Former light smoker, quit in [place] on 07/31/2019 which showed peripheral RUL solid pulmonary nodule measuring 10 x 5 mm, which has increased in size (previously 9 x 5 mm on 05/13/2019 CT and 5 x 3 mm on 12/09/2018 CT) - PET scan on 09/02/19 showed 1.0 x 0.5 cm RUL nodule with a SUV of 3.8 compatible with malignancy. No findings of hypermetabolic adenopathy or distant spread. - Will see if we can convert HRCT from May to Super-D image  - Scheduled for apt with Dr. Lamonte Sakai on July 28th

## 2019-09-02 NOTE — Telephone Encounter (Signed)
That will be fine 09/30/19 but plase ensure he gets the super D for him

## 2019-09-02 NOTE — Telephone Encounter (Signed)
Discussed PET scan results. We are working to get HRCT image changed to Super D (this was done on 5/14, we will see if they still have images) and getting him an apt with Dr. Valeta Harms or Lamonte Sakai.

## 2019-09-03 NOTE — Telephone Encounter (Signed)
Referral has been placed for pt to be referred to [facility].

## 2019-09-03 NOTE — Telephone Encounter (Signed)
Please refer to [facility] re: solitary pulmonary nodule >1cm/ PET positive

## 2019-09-03 NOTE — Telephone Encounter (Signed)
Can I open up a single slot and see him at 4:00pm on 6/22?

## 2019-09-04 NOTE — Telephone Encounter (Signed)
Pt has been scheduled to see Dr. Lamonte Sakai on 09/08/2019 at 1600.

## 2019-09-07 ENCOUNTER — Telehealth: Payer: Self-pay | Admitting: Emergency Medicine

## 2019-09-07 NOTE — Telephone Encounter (Signed)
I had scheduled the patient for afternoon 6/22.   Please change him to 11:30am on 6/23, call him and let him know that I need to reschedule.   Thanks very much.

## 2019-09-08 ENCOUNTER — Ambulatory Visit: Payer: PPO | Admitting: Emergency Medicine

## 2019-09-08 NOTE — Telephone Encounter (Signed)
Appointments have been canceled per Dr. Lamonte Sakai.

## 2019-09-08 NOTE — Telephone Encounter (Signed)
I explained to the patient and his wife that he doesn't need to see me today.   Please cancel his OV with me this pm, and also the consult visit that was scheduled for 7/28.   Thanks.

## 2019-09-08 NOTE — Telephone Encounter (Signed)
Please disregard the request above about rescheduling the patient.  I was able to review the chart, films, discussed the patient with Dr. Kipp Brood with [facility].  I think that [name] is probably a surgical candidate for primary resection of his nodule.  Dr. Kipp Brood agrees and the patient already has an appointment to see him on 6/23 at 9:45 AM.  He should not need to see me today or tomorrow, just needs to go to the appointment with Dr. Kipp Brood to discuss the pros, cons, risks and benefits of a primary resection.  Thank you. Please let me know if there are questions or concerns.

## 2019-09-09 ENCOUNTER — Institutional Professional Consult (permissible substitution): Payer: PPO | Admitting: Thoracic Surgery (Cardiothoracic Vascular Surgery)

## 2019-09-09 ENCOUNTER — Other Ambulatory Visit: Payer: Self-pay | Admitting: Thoracic Surgery (Cardiothoracic Vascular Surgery)

## 2019-09-09 ENCOUNTER — Other Ambulatory Visit: Payer: Self-pay

## 2019-09-09 VITALS — BP 133/63 | HR 82 | Temp 97.6°F | Resp 20 | Ht 68.0 in | Wt 169.0 lb

## 2019-09-09 DIAGNOSIS — R911 Solitary pulmonary nodule: Secondary | ICD-10-CM

## 2019-09-09 DIAGNOSIS — Z951 Presence of aortocoronary bypass graft: Secondary | ICD-10-CM | POA: Diagnosis not present

## 2019-09-11 ENCOUNTER — Encounter: Payer: Self-pay | Admitting: Thoracic Surgery (Cardiothoracic Vascular Surgery)

## 2019-09-11 NOTE — Progress Notes (Signed)
[address]       [place] [address]             [phone]                    Dina B. Karner [place] Medical Record #[id number] Date of Birth: Dec 31, 1931  Referring: Garner Nash, DO Primary Care: [name], MD Primary Cardiologist: Buford Dresser, MD  Chief Complaint:    Chief Complaint  Patient presents with  . Lung Lesion    Chest CT 07/31/19, PET 09/01/19    History of Present Illness:    [name] 86 y.o. male presents for surgical evaluation of a 1 cm right upper lobe pulmonary nodule with an SUV of 3.8.  He was evaluated by Dr. Lamonte Sakai, and would like [place] to have a discussion about navigational bronchoscopy with marking and wedge resection.  He is a previous patient of Dr. Roxy Horseman for which he underwent a CABG and mitral valve annuloplasty back in 2011, and recently underwent TAVR in March of this year.  He is recovered well from this.  He denies any shortness of breath or cough, his weight has been stable and he denies any neurologic symptoms.  He remains quite functional    Smoking Hx: Quit smoking in 1957   Zubrod Score: At the time of surgery this patient's most appropriate activity status/level should be described as: [x]     0    Normal activity, no symptoms []     1    Restricted in physical strenuous activity but ambulatory, able to do out light work []     2    Ambulatory and capable of self care, unable to do work activities, up and about               >50 % of waking hours                              []     3    Only limited self care, in bed greater than 50% of waking hours []     4    Completely disabled, no self care, confined to bed or chair []     5    Moribund   Past Medical History:  Diagnosis Date  . Adenomatous colon polyp   . CAD (coronary artery disease)    a. S/P CABG x 1 @ time of MV annuloplasty;  b. 02/2010 ETT: neg for ischemia.  . Carotid artery stenosis    1-39% by dopplers 10/2016  . Chronic  combined systolic (congestive) and diastolic (congestive) heart failure ([place])   . CKD (chronic kidney disease), stage III   . Current use of long term anticoagulation    a. Coumadin in setting of afib.  . Diverticulosis   . Dyslipidemia   . Essential hypertension   . H/O mitral valve repair 09/16/2002   Complex valvuloplasty including quadrangular resection of posterior leaflet with artificial Gore-tex neochords and 28 mm Seguin ring annuloplasty - Dr Servando Snare  . Hemorrhoids   . Hyperlipidemia   . Hypertension   . Permanent atrial fibrillation (HCC)    a. CHA2DS2VASc = 6 -->chronic coumadin;  b. Prev on amio-->d/c 2/2 hypothyroidism. >> resumed 5/16. c. Notes from 2017 indicate interstitial lung disease by pulm appt, question amio toxicity, also increased liver attenuation. Rate control pursued and amiodarone discontinued.  . S/P CABG x  1 09/16/2002   SVG to OM  . 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 Sleep apnea 12/2018   per patient's spouse wears BIPAP "every other night or so"  . Thrombocytopenia ([place])   . TIA (transient ischemic attack)     Past Surgical History:  Procedure Laterality Date  . CATARACT EXTRACTION W/ INTRAOCULAR LENS  IMPLANT, BILATERAL     per patient about 4 years ago  . CORONARY ARTERY BYPASS GRAFT    . INTRAOPERATIVE TRANSTHORACIC ECHOCARDIOGRAM N/A 05/26/2019   Procedure: INTRAOPERATIVE TRANSTHORACIC ECHOCARDIOGRAM;  Surgeon: Sherren Mocha, MD;  Location: [facility];  Service: Open Heart Surgery;  Laterality: N/A;  . MITRAL VALVE REPAIR    . RIGHT/LEFT HEART CATH AND CORONARY/GRAFT ANGIOGRAPHY N/A 04/28/2019   Procedure: RIGHT/LEFT HEART CATH AND CORONARY/GRAFT ANGIOGRAPHY;  Surgeon: Sherren Mocha, MD;  Location: [facility];  Service: Cardiovascular;  Laterality: N/A;  . TEE WITHOUT CARDIOVERSION N/A 05/20/2019   Procedure: TRANSESOPHAGEAL  ECHOCARDIOGRAM (TEE);  Surgeon: Donato Heinz, MD;  Location: [facility] ENDOSCOPY;  Service: Cardiovascular;  Laterality: N/A;  . TRANSCATHETER AORTIC VALVE REPLACEMENT, TRANSFEMORAL N/A 05/26/2019   Procedure: TRANSCATHETER AORTIC VALVE REPLACEMENT, TRANSFEMORAL;  Surgeon: Sherren Mocha, MD;  Location: [facility];  Service: Open Heart Surgery;  Laterality: N/A;    Family History  Problem Relation Age of Onset  . Stroke Mother        Deceased  . Heart disease Father        Deceased  . Heart failure Brother        Deceased  . Stroke Brother      Social History   Tobacco Use  Smoking Status Former Smoker  . Packs/day: 0.10  . Years: 2.00  . Pack years: 0.20  . Types: Cigarettes  . Quit date: 03/20/1955  . Years since quitting: 64.5  Smokeless Tobacco Never Used    Social History   Substance and Sexual Activity  Alcohol Use No  . Alcohol/week: 0.0 standard drinks     Allergies  Allergen Reactions  . Asa [Aspirin] Other (See Comments)    Bleeding- has internal hemorrhoids  . Flomax [Tamsulosin] Palpitations and Other (See Comments)    Made his heart go out of rhythm  . Pirfenidone     Weight loss, loss of appetite.    Current Outpatient Medications  Medication Sig Dispense Refill  . apixaban (ELIQUIS) 2.5 MG TABS tablet Take 1 tablet (2.5 mg total) by mouth 2 (two) times daily. 180 tablet 3  . Cholecalciferol (VITAMIN D-3) 1000 units CAPS Take 1,000 Units by mouth daily.     Marland Kitchen docusate sodium (COLACE) 100 MG capsule Take 100 mg by mouth 2 (two) times daily.    . feeding supplement, ENSURE ENLIVE, (ENSURE ENLIVE) LIQD Take 237 mLs by mouth 2 (two) times daily between meals. 237 mL 12  . hydrocortisone 2.5 % cream Apply topically as needed.     Marland Kitchen levothyroxine (SYNTHROID) 75 MCG tablet Take 75 mcg by mouth daily before breakfast.     . metoprolol succinate (TOPROL-XL) 50 MG 24 hr tablet TAKE ONE TABLET BY MOUTH ONE TIME DAILY  90 tablet 0  . mirtazapine (REMERON) 15 MG  tablet Take 0.5 tablets (7.5 mg total) by mouth at bedtime.    Vladimir Faster Glycol-Propyl Glycol (SYSTANE OP) Place 1 drop into both eyes daily as needed (dry  eyes).    . potassium chloride SA (KLOR-CON) 20 MEQ tablet Take 1 tablet (20 mEq total) by mouth daily. 90 tablet 3  . pravastatin (PRAVACHOL) 80 MG tablet Take 80 mg by mouth every Monday, Wednesday, and Friday.     . torsemide (DEMADEX) 20 MG tablet Take 60 mg by mouth daily. 2 tablet in the morning and 1 tablet at night    . vitamin B-12 (CYANOCOBALAMIN) 1000 MCG tablet Take 1,000 mcg by mouth daily.     No current facility-administered medications for this visit.    Review of Systems  Constitutional: Negative.   Respiratory: Negative.   Cardiovascular: Negative.   Gastrointestinal: Negative.   Neurological: Negative.      PHYSICAL EXAMINATION: BP 133/63   Pulse 82   Temp 97.6 F (36.4 C) (Skin)   Resp 20   Ht 5\' 8"  (1.727 m)   Wt 169 lb (76.7 kg)   SpO2 95% Comment: RA  BMI 25.70 kg/m  Physical Exam  Constitutional: He is oriented to person, place, and time.  HENT:  Head: Normocephalic and atraumatic.  Nose: Nose normal.  Eyes: Conjunctivae are normal.  Cardiovascular: Normal rate.  Respiratory: Effort normal.  GI: Soft. Normal appearance.  Musculoskeletal:        General: Normal range of motion.     Cervical back: Normal range of motion.  Neurological: He is alert and oriented to person, place, and time.  Skin: Skin is warm and dry.    Diagnostic Studies & Laboratory data:     Recent Radiology Findings:   NM PET Image Initial (PI) Skull Base To Thigh  Result Date: 09/01/2019 CLINICAL DATA:  Initial treatment strategy for pulmonary nodule. EXAM: NUCLEAR MEDICINE PET SKULL BASE TO THIGH TECHNIQUE: 8.3 mCi F-18 FDG was injected intravenously. Full-ring PET imaging was performed from the skull base to thigh after the radiotracer. CT data was obtained and used for attenuation correction and anatomic  localization. Fasting blood glucose: 112 mg/dl COMPARISON:  CT chest 07/31/2019 FINDINGS: Mediastinal blood pool activity: SUV max 2.5 Liver activity: SUV max N/A NECK: No significant abnormal hypermetabolic activity in this region. Incidental CT findings: Bilateral common carotid atherosclerotic calcification. CHEST: The right upper lobe nodule of concern measures 1.0 by 0.5 cm on image 25/8 and has a maximum SUV of 3.8, compatible with malignancy. Incidental CT findings: Prosthetic aortic valve. Coronary, aortic arch, and branch vessel atherosclerotic vascular disease. Prior CABG. Mild to moderate cardiomegaly. ABDOMEN/PELVIS: No significant abnormal hypermetabolic activity in this region. Incidental CT findings: Aortoiliac atherosclerotic vascular disease. Mild proximal sigmoid colon diverticulosis. SKELETON: Mildly accentuated activity along Schmorl's nodes in the lumbar spine. Incidental CT findings: Lumbar spondylosis.  Cervical spondylosis. IMPRESSION: 1. The 1.0 by 0.5 cm right upper lobe nodule has a maximum SUV of 3.8, compatible with malignancy. No findings of hypermetabolic adenopathy or distant spread. 2. Other imaging findings of potential clinical significance: Aortic Atherosclerosis (ICD10-I70.0). Coronary atherosclerosis. Mild to moderate cardiomegaly. Aortic valve prosthesis. Prior CABG. Mild proximal sigmoid colon diverticulosis. Lumbar and cervical spondylosis. Electronically Signed   By: Van Clines M.D.   On: 09/01/2019 09:35       I have independently reviewed the above radiology studies  and reviewed the findings with the patient.   Recent Lab Findings: Lab Results  Component Value Date   WBC 6.2 06/04/2019   HGB 13.0 06/04/2019   HCT 40.3 06/04/2019   PLT 87 (LL) 06/04/2019   GLUCOSE 84 06/25/2019   CHOL 193 06/17/2017  TRIG 114 06/17/2017   HDL 35 (L) 06/17/2017   LDLCALC 135 (H) 06/17/2017   ALT 17 05/22/2019   AST 25 05/22/2019   NA 142 06/25/2019   K 4.3  06/25/2019   CL 99 06/25/2019   CREATININE 1.97 (H) 06/25/2019   BUN 63 (H) 06/25/2019   CO2 24 06/25/2019   TSH 3.558 12/24/2017   INR 1.4 (H) 05/22/2019   HGBA1C 5.5 05/22/2019      PFTs: From 05/09/2019 - FVC: 80% - FEV1: 95% -DLCO: 66%  Problem List: 1 cm right upper lobe pulmonary nodule PET avidity History of coronary disease status post mitral valve annuloplasty and CABG Recent history of TAVR not currently on Plavix  Assessment / Plan:   86 year old gentleman with a 1 cm right upper lobe pulmonary nodule.  He is a previous patient of Dr. Roxy Horseman and I will discuss plans with him for navigational bronchoscopy and marking with Dr. Lamonte Sakai, followed by robotic assisted wedge resection as a combo procedure.  Given his advanced age, significant cardiac history and size of the lesion I think that potentially just performing the wedge resection will be enough along with lymph node sampling.  Other options included bronchoscopic biopsy followed by radiation but the patient stated that he would rather like this cut out.  His pulmonary function testing from February looked good.  I will coordinate with Drs. Demaris Callander in regards to scheduling this patient.     I  spent 40 minutes with  the patient face to face and greater then 50% of the time was spent in counseling and coordination of care.    Lajuana Matte 09/11/2019 12:24 PM

## 2019-09-14 ENCOUNTER — Other Ambulatory Visit: Payer: Self-pay | Admitting: Cardiology

## 2019-09-14 DIAGNOSIS — I5042 Chronic combined systolic (congestive) and diastolic (congestive) heart failure: Secondary | ICD-10-CM

## 2019-09-14 DIAGNOSIS — I4891 Unspecified atrial fibrillation: Secondary | ICD-10-CM

## 2019-09-25 ENCOUNTER — Telehealth (INDEPENDENT_AMBULATORY_CARE_PROVIDER_SITE_OTHER): Payer: PPO | Admitting: Cardiology

## 2019-09-25 VITALS — BP 120/67 | HR 78 | Ht 68.0 in | Wt 165.0 lb

## 2019-09-25 DIAGNOSIS — Z952 Presence of prosthetic heart valve: Secondary | ICD-10-CM

## 2019-09-25 DIAGNOSIS — I482 Chronic atrial fibrillation, unspecified: Secondary | ICD-10-CM | POA: Diagnosis not present

## 2019-09-25 DIAGNOSIS — Z9889 Other specified postprocedural states: Secondary | ICD-10-CM | POA: Diagnosis not present

## 2019-09-25 DIAGNOSIS — I5042 Chronic combined systolic (congestive) and diastolic (congestive) heart failure: Secondary | ICD-10-CM | POA: Diagnosis not present

## 2019-09-25 DIAGNOSIS — Z7901 Long term (current) use of anticoagulants: Secondary | ICD-10-CM

## 2019-09-25 NOTE — Progress Notes (Signed)
Virtual Visit via Telephone Note   This visit type was conducted due to national recommendations for restrictions regarding the COVID-19 Pandemic (e.g. social distancing) in an effort to limit this patient's exposure and mitigate transmission in our community.  Due to his co-morbid illnesses, this patient is at least at moderate risk for complications without adequate follow up.  This format is felt to be most appropriate for this patient at this time.  The patient did not have access to video technology/had technical difficulties with video requiring transitioning to audio format only (telephone).  All issues noted in this document were discussed and addressed.  No physical exam could be performed with this format.  Please refer to the patient's chart for his  consent to telehealth for [facility].   Date:  09/25/2019   ID:  [name], DOB 1931-09-07, MRN [id number]  Patient Location: Home Provider Location: Office  PCP:  [name], MD  Cardiologist:  Buford Dresser, MD  Electrophysiologist:  None   Evaluation Performed:  Follow-Up Visit  Chief Complaint:  Follow up  History of Present Illness:    [name] is a 86 y.o. male with a hx of CAD s/p CABG, mitral valve repair, atrial fibrillation on anticoagulation, interstitial lung disease, chronic systolic and diastolic heart failure, low flow low gradient aortic stenosis s/p TAVR who is seen in follow up today. He follows at the [facility] in [place].   The patient does not have symptoms concerning for COVID-19 infection (fever, chills, cough, or new shortness of breath).   Today: Energy up, walks to mailbox and back. They are ecstatic about how much better he has been doing since his TAVR. Atrial fibrillation rates much better controlled. Has more energy than he has in a long time. Tolerating medications.  Reviewed notes from Dr. Lamonte Sakai and Dr. Kipp Brood. Appears to me that plan is for resection of RUL pulmonary  nodule. Mr. [name] wife notes that they are schedule for a Covid test on 7/13 and then an unknown procedure on 7/15. On my review of the chart, appears these were planned through pulmonology and have been cancelled. I do not see a date for surgery in the chart. I will reach out to the team at the patient's request to clarify.  Denies chest pain, shortness of breath at rest. No PND, orthopnea, LE edema or unexpected weight gain. No syncope or palpitations.   Past Medical History:  Diagnosis Date  . Adenomatous colon polyp   . CAD (coronary artery disease)    a. S/P CABG x 1 @ time of MV annuloplasty;  b. 02/2010 ETT: neg for ischemia.  . Carotid artery stenosis    1-39% by dopplers 10/2016  . Chronic combined systolic (congestive) and diastolic (congestive) heart failure ([place])   . CKD (chronic kidney disease), stage III   . Current use of long term anticoagulation    a. Coumadin in setting of afib.  . Diverticulosis   . Dyslipidemia   . Essential hypertension   . H/O mitral valve repair 09/16/2002   Complex valvuloplasty including quadrangular resection of posterior leaflet with artificial Gore-tex neochords and 28 mm Seguin ring annuloplasty - Dr Servando Snare  . Hemorrhoids   . Hyperlipidemia   . Hypertension   . Permanent atrial fibrillation (HCC)    a. CHA2DS2VASc = 6 -->chronic coumadin;  b. Prev on amio-->d/c 2/2 hypothyroidism. >> resumed 5/16. c. Notes from 2017 indicate interstitial lung disease by pulm appt, question amio toxicity, also increased  liver attenuation. Rate control pursued and amiodarone discontinued.  . S/P CABG x 1 09/16/2002   SVG to OM  . 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 Sleep apnea 12/2018   per patient's spouse wears BIPAP "every other night or so"  . Thrombocytopenia ([place])   . TIA (transient ischemic attack)    Past Surgical History:    Procedure Laterality Date  . CATARACT EXTRACTION W/ INTRAOCULAR LENS  IMPLANT, BILATERAL     per patient about 4 years ago  . CORONARY ARTERY BYPASS GRAFT    . INTRAOPERATIVE TRANSTHORACIC ECHOCARDIOGRAM N/A 05/26/2019   Procedure: INTRAOPERATIVE TRANSTHORACIC ECHOCARDIOGRAM;  Surgeon: Sherren Mocha, MD;  Location: [facility];  Service: Open Heart Surgery;  Laterality: N/A;  . MITRAL VALVE REPAIR    . RIGHT/LEFT HEART CATH AND CORONARY/GRAFT ANGIOGRAPHY N/A 04/28/2019   Procedure: RIGHT/LEFT HEART CATH AND CORONARY/GRAFT ANGIOGRAPHY;  Surgeon: Sherren Mocha, MD;  Location: [facility];  Service: Cardiovascular;  Laterality: N/A;  . TEE WITHOUT CARDIOVERSION N/A 05/20/2019   Procedure: TRANSESOPHAGEAL ECHOCARDIOGRAM (TEE);  Surgeon: Donato Heinz, MD;  Location: [facility] ENDOSCOPY;  Service: Cardiovascular;  Laterality: N/A;  . TRANSCATHETER AORTIC VALVE REPLACEMENT, TRANSFEMORAL N/A 05/26/2019   Procedure: TRANSCATHETER AORTIC VALVE REPLACEMENT, TRANSFEMORAL;  Surgeon: Sherren Mocha, MD;  Location: [facility];  Service: Open Heart Surgery;  Laterality: N/A;     Current Meds  Medication Sig  . apixaban (ELIQUIS) 2.5 MG TABS tablet Take 1 tablet (2.5 mg total) by mouth 2 (two) times daily.  . Cholecalciferol (VITAMIN D-3) 1000 units CAPS Take 1,000 Units by mouth daily.   Marland Kitchen docusate sodium (COLACE) 100 MG capsule Take 100 mg by mouth daily as needed.   . feeding supplement, ENSURE ENLIVE, (ENSURE ENLIVE) LIQD Take 237 mLs by mouth 2 (two) times daily between meals.  . hydrocortisone 2.5 % cream Apply topically as needed.   Marland Kitchen levothyroxine (SYNTHROID) 75 MCG tablet Take 75 mcg by mouth daily before breakfast.   . metoprolol succinate (TOPROL-XL) 50 MG 24 hr tablet TAKE ONE TABLET BY MOUTH ONE TIME DAILY  . mirtazapine (REMERON) 15 MG tablet Take 0.5 tablets (7.5 mg total) by mouth at bedtime.  Vladimir Faster Glycol-Propyl Glycol (SYSTANE OP) Place 1 drop into both eyes daily as needed (dry eyes).   . potassium chloride SA (KLOR-CON) 20 MEQ tablet Take 1 tablet (20 mEq total) by mouth daily.  . pravastatin (PRAVACHOL) 80 MG tablet Take 80 mg by mouth every Monday, Wednesday, and Friday.   . torsemide (DEMADEX) 20 MG tablet Take 60 mg by mouth daily. 2 tablet in the morning and 1 tablet at night  . vitamin B-12 (CYANOCOBALAMIN) 1000 MCG tablet Take 1,000 mcg by mouth daily.     Allergies:   Asa [aspirin], Flomax [tamsulosin], and Pirfenidone   Social History   Tobacco Use  . Smoking status: Former Smoker    Packs/day: 0.10    Years: 2.00    Pack years: 0.20    Types: Cigarettes    Quit date: 03/20/1955    Years since quitting: 64.5  . Smokeless tobacco: Never Used  Vaping Use  . Vaping Use: Never used  Substance Use Topics  . Alcohol use: No    Alcohol/week: 0.0 standard drinks  . Drug use: No     Family Hx: The patient's family history includes Heart disease in  his father; Heart failure in his brother; Stroke in his brother and mother.  ROS:   Please see the history of present illness.    All other systems reviewed and are negative.   Prior CV studies:   The following studies were reviewed today: Echo 06/25/19 1. Left ventricular ejection fraction, by estimation, is <20%. The left  ventricle has severely decreased function. The left ventricle demonstrates  global hypokinesis. The left ventricular internal cavity size was mildly  dilated. Left ventricular  diastolic parameters are indeterminate.  2. S/p mitral valve repair. Mild-moderate mitral regurgitation. Mean  gradient 4 mmHg, mild mitral stenosis.  3. Bioprosthetic aortic valve s/p TAVR, 29 mm Edwards Sapien. Trivial  perivalvular regurgitation. Mean gradient 4 mmHg, no significant stenosis.  4. Left atrial size was moderately dilated.  5. Right ventricular systolic function is mildly reduced. The right  ventricular size is mildly enlarged. There is mildly elevated pulmonary  artery systolic pressure.  The estimated right ventricular systolic  pressure is 30.8 mmHg.  6. The inferior vena cava is normal in size with <50% respiratory  variability, suggesting right atrial pressure of 8 mmHg.   Echo 01/13/19  1. Left ventricular ejection fraction, by visual estimation, is 20 to 25%. The left ventricle has severely decreased function. Moderate to severely increased left ventricular size. There is no left ventricular hypertrophy.  2. Left ventricular diastolic function could not be evaluated pattern of LV diastolic filling.  3. Global right ventricle has normal systolic function.The right ventricular size is mildly enlarged. No increase in right ventricular wall thickness.  4. Left atrial size was moderately dilated.  5. Right atrial size was moderately dilated.  6. Moderate calcification of the mitral valve leaflet(s).  7. Moderate thickening of the mitral valve leaflet(s).  8. Severely decreased mobility of the mitral valve leaflets.  9. The mitral valve has been repaired/replaced. Moderate to severe mitral valve regurgitation. 10. The tricuspid valve is normal in structure. Tricuspid valve regurgitation is mild. 11. The aortic valve is abnormal Aortic valve regurgitation is mild by color flow Doppler. 12. There is Severe calcifcation of the aortic valve. 13. There is Severely thickening of the aortic valve. 14. Suspect low flow-low gradient AS given visual limited movement of aortic valve and severely reduced LV EF. 15. The pulmonic valve was grossly normal. Pulmonic valve regurgitation is mild to moderate by color flow Doppler. 16. Moderately elevated pulmonary artery systolic pressure. 17. The inferior vena cava is normal in size with <50% respiratory variability, suggesting right atrial pressure of 8 mmHg. 18. While heart rate much improved on this study, side by side comparison shows dilation of the LV and stable severely reduced LV EF. The aortic valve is severely thickened/calcified and  visually appears to minimally open. Suspect low flow low gradient  severe AS, as dimensionless index 0.18. Prior MV repair with immobile posterior mitral valve leaflet.  Labs/Other Tests and Data Reviewed:    EKG:  An ECG dated 06/04/19 was personally reviewed today and demonstrated:  afib  Recent Labs: 05/22/2019: ALT 17; B Natriuretic Peptide 3,448.0 05/27/2019: Magnesium 1.7 06/04/2019: Hemoglobin 13.0; Platelets 87 06/25/2019: BUN 63; Creatinine, Ser 1.97; Potassium 4.3; Sodium 142   Recent Lipid Panel Lab Results  Component Value Date/Time   CHOL 193 06/17/2017 07:44 AM   TRIG 114 06/17/2017 07:44 AM   HDL 35 (L) 06/17/2017 07:44 AM   CHOLHDL 5.5 (H) 06/17/2017 07:44 AM   CHOLHDL 2.6 03/16/2016 09:03 AM   LDLCALC 135 (H) 06/17/2017  07:44 AM    Wt Readings from Last 3 Encounters:  09/25/19 165 lb (74.8 kg)  09/09/19 169 lb (76.7 kg)  09/02/19 170 lb (77.1 kg)     Objective:    Vital Signs:  BP 120/67   Pulse 78   Ht 5\' 8"  (1.727 m)   Wt 165 lb (74.8 kg)   BMI 25.09 kg/m    No physical exam over the phone.  ASSESSMENT & PLAN:    Chronic systolicand diastolicheart failuure:systolic failure diagnosed in 12/2017. Pre-TAVR EF <20%, unchanged post TAVR. -weight has been stable on current regimen -reports doing well on torsemide 40 mg in the AM and 20 mg in the PM -continue metoprolol succinate -no room for ACEi/ARB/ARNI/MRA  Atrial Fibrillation, permanent:  -drastically improved rate control since TAVR. Had been extremely difficult to control rate prior -tolerating metoprolol succinate 50 mg daily, increased dose leads to worsening hypotension -not controlled on amiodarone in the past (and with liver/lung pathology, would avoid), no diltiazem given low EF, declines digoxin after shared decision making.  -tolerating apixaban 2.5 mg BID (reduced for age, Cr) -CHA2DS2/VAS Stroke Risk Points=5   sleep apnea events, pulm disease, lung nodule: -follows with  pulmonology -has Bipap, though has not been using much since TAVR as he feels much better -they are unclear about what the plan is for the lunch nodule. Wife was under the impression they were supposed to have a Covid test 7/13 and then a procedure later that week. She though this was for lung resection; appears it may have been a bronch that is now cancelled. I will send a message to her CT and pulm providers asking them to clarify for her.  History of CKD, stage 4  CAD: remote h/o CABG. -no chest pain -on pravastatin (see below), no aspirin given apixaban for afib  Hyperlipidemia: -Goal LDL <70, previously LDL 135 in 2019 (not at goal). Followed through [facility] as well -On pravastatin 80 mg. We have discussed intensifying in the past, but given age/comorbidities/lack of symptoms have left alone.  Mitral Regurgitation: h/o MV repair. Mild-moderate on TAVR TEE  Aortic Stenosis, severe, s/p recent TAVR: feeling the best he has felt in a long time. -has endocarditis prophylaxis ordered  COVID-19 Education: The signs and symptoms of COVID-19 were discussed with the patient and how to seek care for testing (follow up with PCP or arrange E-visit).  The importance of social distancing was discussed today.  Follow up in 3-4 mos in person  Time:   Today, I have spent 12 minutes with the patient with telehealth technology discussing the above problems.   Patient Instructions  Medication Instructions:  Your Physician recommend you continue on your current medication as directed.    *If you need a refill on your cardiac medications before your next appointment, please call your pharmacy*   Lab Work: None   Testing/Procedures: None   Follow-Up: At [facility], you and your health needs are our priority.  As part of our continuing mission to provide you with exceptional heart care, we have created designated Provider Care Teams.  These Care Teams include your primary Cardiologist  (physician) and Advanced Practice Providers (APPs -  Physician Assistants and Nurse Practitioners) who all work together to provide you with the care you need, when you need it.  We recommend signing up for the patient portal called "MyChart".  Sign up information is provided on this After Visit Summary.  MyChart is used to connect with patients for Virtual  Visits (Telemedicine).  Patients are able to view lab/test results, encounter notes, upcoming appointments, etc.  Non-urgent messages can be sent to your provider as well.   To learn more about what you can do with MyChart, go to [email or web address].    Your next appointment:   3-4 months  The format for your next appointment:   In Person  Provider:   Buford Dresser, MD       Signed, Buford Dresser, MD  09/25/2019  [place]

## 2019-09-25 NOTE — Patient Instructions (Signed)
Medication Instructions:  Your Physician recommend you continue on your current medication as directed.    *If you need a refill on your cardiac medications before your next appointment, please call your pharmacy*   Lab Work: None   Testing/Procedures: None   Follow-Up: At [facility], you and your health needs are our priority.  As part of our continuing mission to provide you with exceptional heart care, we have created designated Provider Care Teams.  These Care Teams include your primary Cardiologist (physician) and Advanced Practice Providers (APPs -  Physician Assistants and Nurse Practitioners) who all work together to provide you with the care you need, when you need it.  We recommend signing up for the patient portal called "MyChart".  Sign up information is provided on this After Visit Summary.  MyChart is used to connect with patients for Virtual Visits (Telemedicine).  Patients are able to view lab/test results, encounter notes, upcoming appointments, etc.  Non-urgent messages can be sent to your provider as well.   To learn more about what you can do with MyChart, go to [email or web address].    Your next appointment:   3-4 months  The format for your next appointment:   In Person  Provider:   Buford Dresser, MD

## 2019-09-28 ENCOUNTER — Encounter: Payer: Self-pay | Admitting: Cardiology

## 2019-09-29 ENCOUNTER — Other Ambulatory Visit (HOSPITAL_COMMUNITY): Payer: PPO

## 2019-10-01 ENCOUNTER — Encounter (HOSPITAL_COMMUNITY): Payer: PPO

## 2019-10-01 NOTE — Progress Notes (Signed)
[address]       [place] [address]             [phone]                    Layn B. Schnoebelen [employer] Medical Record #[id number] Date of Birth: Sep 24, 1931  Referring: Collene Gobble, MD Primary Care: [name], MD Primary Cardiologist: Buford Dresser, MD  Chief Complaint:    Chief Complaint  Patient presents with  . Lung Lesion    surgical eval for lung resection     History of Present Illness:    [name] 86 y.o. male is seen in the office  today for consideration of wedge resection of right lung lesion referred by Dr. Lamonte Sakai.  The patient is known to me from 2004 when he underwent coronary artery bypass grafting and mitral valve repair.  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  More recently developed increasing signs and symptoms of congestive heart failure, low cardiac output and poor LV function.  In March 2021 because of progressive aortic stenosis he underwent placement of percutaneous aortic valve-TAVR.  His wife notes that since his TAVR valve his functional status has markedly improved-he had gone from very limited mobility due to shortness of breath to near normal activities.  Echocardiogram done after the TAVR April 2021 still noted ejection fraction less than 20% mild to moderate mitral regurgitation.    Patient has a distant history of smoking but quit in 1957  Patient had been referred by Dr. Radford Pax from cardiology to Dr. Chase Caller for abnormal pulmonary function testing-he had been started on amnio several years ago-in 2017 pulmonary functions were normal except mild restriction and total lung capacity and slight reduction in his DLCO-previous CT scan suggested possible ILD versus atelectasis.  Dr. Chase Caller noted undifferentiated interstitial lung disease stable since 2015  CT scan done by Dr. Chase Caller May 2021  Peripheral  right upper lobe 10 x 5 mm solid pulmonary nodule is slightly increased from 9 x 5 mm on 05/13/2019 CT and is clearly increased from 5 x 3 mm on 12/09/2018 CT. Primary bronchogenic carcinoma not excluded. Consider PET-CT for further evaluation versus continued close chest CT surveillance in 3 months   Current Activity/ Functional Status:  Patient is independent with mobility/ambulation, transfers, ADL's, IADL's.   Zubrod Score: At the time of surgery this patient's most appropriate activity status/level should be described as: []     0    Normal activity, no symptoms [x]     1    Restricted in physical strenuous activity but ambulatory, able to do out light work []     2    Ambulatory and capable of self care, unable to do work activities, up and about               >50 % of waking hours                              []     3    Only limited self care, in bed greater than 50% of waking hours []     4    Completely disabled, no self care, confined to bed or chair []     5    Moribund   Past Medical History:  Diagnosis Date  . Adenomatous colon polyp   . CAD (coronary artery disease)    a. S/P CABG x 1 @ time of MV annuloplasty;  b. 02/2010 ETT: neg for ischemia.  . Carotid artery stenosis    1-39% by dopplers 10/2016  . Chronic combined systolic (congestive) and diastolic (congestive) heart failure ([facility])   . CKD (chronic kidney disease), stage III   . Current use of long term anticoagulation    a. Coumadin in setting of afib.  . Diverticulosis   . Dyslipidemia   . Essential hypertension   . H/O mitral valve repair 09/16/2002   Complex valvuloplasty including quadrangular resection of posterior leaflet with artificial Gore-tex neochords and 28 mm Seguin ring annuloplasty - Dr Servando Snare  . Hemorrhoids   . Hyperlipidemia   . Hypertension   . Permanent atrial fibrillation (HCC)    a. CHA2DS2VASc = 6 -->chronic coumadin;  b. Prev on amio-->d/c 2/2 hypothyroidism. >> resumed 5/16. c. Notes  from 2017 indicate interstitial lung disease by pulm appt, question amio toxicity, also increased liver attenuation. Rate control pursued and amiodarone discontinued.  . S/P CABG x 1 09/16/2002   SVG to OM  . 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 Sleep apnea 12/2018   per patient's spouse wears BIPAP "every other night or so"  . Thrombocytopenia ([place])   . TIA (transient ischemic attack)     Past Surgical History:  Procedure Laterality Date  . CATARACT EXTRACTION W/ INTRAOCULAR LENS  IMPLANT, BILATERAL     per patient about 4 years ago  . CORONARY ARTERY BYPASS GRAFT    . INTRAOPERATIVE TRANSTHORACIC ECHOCARDIOGRAM N/A 05/26/2019   Procedure: INTRAOPERATIVE TRANSTHORACIC ECHOCARDIOGRAM;  Surgeon: Sherren Mocha, MD;  Location: [facility];  Service: Open Heart Surgery;  Laterality: N/A;  . MITRAL VALVE REPAIR    . RIGHT/LEFT HEART CATH AND CORONARY/GRAFT ANGIOGRAPHY N/A 04/28/2019   Procedure: RIGHT/LEFT HEART CATH AND CORONARY/GRAFT ANGIOGRAPHY;  Surgeon: Sherren Mocha, MD;  Location: [facility];  Service: Cardiovascular;  Laterality: N/A;  . TEE WITHOUT CARDIOVERSION N/A 05/20/2019   Procedure: TRANSESOPHAGEAL ECHOCARDIOGRAM (TEE);  Surgeon: Donato Heinz, MD;  Location[facility] ENDOSCOPY;  Service: Cardiovascular;  Laterality: N/A;  . TRANSCATHETER AORTIC VALVE REPLACEMENT, TRANSFEMORAL N/A 05/26/2019   Procedure: TRANSCATHETER AORTIC VALVE REPLACEMENT, TRANSFEMORAL;  Surgeon: Sherren Mocha, MD;  Location: [facility];  Service: Open Heart Surgery;  Laterality: N/A;    Family History  Problem Relation Age of Onset  . Stroke Mother        Deceased  . Heart disease Father        Deceased  . Heart failure Brother        Deceased  . Stroke Brother      Social History   Tobacco Use  Smoking Status Former Smoker  . Packs/day: 0.10  . Years: 2.00  . Pack years: 0.20  .  Types: Cigarettes  . Quit date: 03/20/1955  . Years since quitting: 64.5  Smokeless Tobacco Never Used    Social History   Substance and Sexual Activity  Alcohol Use No  . Alcohol/week: 0.0 standard drinks     Allergies  Allergen Reactions  . Asa [Aspirin] Other (See Comments)    Bleeding- has internal hemorrhoids  . Flomax [Tamsulosin] Palpitations and Other (See Comments)    Made his heart go out of rhythm  .  Pirfenidone     Weight loss, loss of appetite.    Current Outpatient Medications  Medication Sig Dispense Refill  . apixaban (ELIQUIS) 2.5 MG TABS tablet Take 1 tablet (2.5 mg total) by mouth 2 (two) times daily. (Patient not taking: Reported on 10/02/2019) 180 tablet 3  . Cholecalciferol (VITAMIN D-3) 1000 units CAPS Take 1,000 Units by mouth daily.     Marland Kitchen docusate sodium (COLACE) 100 MG capsule Take 100 mg by mouth daily as needed (constipation.).     Marland Kitchen feeding supplement, ENSURE ENLIVE, (ENSURE ENLIVE) LIQD Take 237 mLs by mouth 2 (two) times daily between meals. 237 mL 12  . levothyroxine (SYNTHROID) 75 MCG tablet Take 75 mcg by mouth daily before breakfast.     . metoprolol succinate (TOPROL-XL) 50 MG 24 hr tablet TAKE ONE TABLET BY MOUTH ONE TIME DAILY (Patient taking differently: Take 50 mg by mouth daily. ) 90 tablet 0  . mirtazapine (REMERON) 15 MG tablet Take 0.5 tablets (7.5 mg total) by mouth at bedtime.    Vladimir Faster Glycol-Propyl Glycol (SYSTANE OP) Place 1 drop into both eyes 3 (three) times daily as needed (dry/irritated eyes.).     Marland Kitchen potassium chloride SA (KLOR-CON) 20 MEQ tablet Take 1 tablet (20 mEq total) by mouth daily. (Patient taking differently: Take 20 mEq by mouth 2 (two) times daily. ) 90 tablet 3  . pravastatin (PRAVACHOL) 80 MG tablet Take 80 mg by mouth every Monday, Wednesday, and Friday.     . torsemide (DEMADEX) 20 MG tablet Take 20-40 mg by mouth See admin instructions. Take 2 tablets (40 mg) by mouth in the morning & take 1 tablet (20 mg) by  mouth at night.    . vitamin B-12 (CYANOCOBALAMIN) 1000 MCG tablet Take 1,000 mcg by mouth daily.    Marland Kitchen apixaban (ELIQUIS) 5 MG TABS tablet Take 2.5 mg by mouth 2 (two) times daily.     No current facility-administered medications for this visit.    Pertinent items are noted in HPI.   Review of Systems:     Cardiac Review of Systems: [Y] = yes  or   [ N ] = no   Chest Pain [ n   ]  Resting SOB Florencio.Farrier   ] Exertional SOB  Blue.Reese  ]  Orthopnea [ n ]   Pedal Edema [  n ]    Palpitations [n  ] Syncope  [ n ]   Presyncope [ n  ]   General Review of Systems: [Y] = yes [  ]=no Constitional: recent weight change [  ];  Wt loss over the last 3 months [   ] anorexia [  ]; fatigue [  ]; nausea [  ]; night sweats [  ]; fever [  ]; or chills [  ];           Eye : blurred vision [  ]; diplopia [   ]; vision changes [  ];  Amaurosis fugax[  ]; Resp: cough [  ];  wheezing[  ];  hemoptysis[  ]; shortness of breath[  ]; paroxysmal nocturnal dyspnea[  ]; dyspnea on exertion[  ]; or orthopnea[  ];  GI:  gallstones[  ], vomiting[  ];  dysphagia[  ]; melena[  ];  hematochezia [  ]; heartburn[  ];   Hx of  Colonoscopy[  ]; GU: kidney stones [  ]; hematuria[  ];   dysuria [  ];  nocturia[  ];  history  of     obstruction [  ]; urinary frequency [  ]             Skin: rash, swelling[  ];, hair loss[  ];  peripheral edema[  ];  or itching[  ]; Musculosketetal: myalgias[  ];  joint swelling[  ];  joint erythema[  ];  joint pain[  ];  back pain[  ];  Heme/Lymph: bruising[  ];  bleeding[  ];  anemia[  ];  Neuro: TIA[  ];  headaches[  ];  stroke[  ];  vertigo[  ];  seizures[  ];   paresthesias[  ];  difficulty walking[  ];  Psych:depression[  ]; anxiety[  ];  Endocrine: diabetes[  ];  thyroid dysfunction[  ];  Immunizations: Flu up to date [ y ]; Pneumococcal up to date Blue.Reese  ]; covid  Y  Other:     PHYSICAL EXAMINATION: BP 106/62 (BP Location: Right Arm, Patient Position: Sitting, Cuff Size: Normal)   Pulse 78   Temp (!)  97 F (36.1 C) (Temporal)   Resp 20   Ht 5\' 8"  (1.727 m)   Wt 165 lb (74.8 kg)   SpO2 98% Comment: RA  BMI 25.09 kg/m  General appearance: alert, cooperative and no distress Head: Normocephalic, without obvious abnormality, atraumatic Neck: no adenopathy, no carotid bruit, no JVD, supple, symmetrical, trachea midline and thyroid not enlarged, symmetric, no tenderness/mass/nodules Lymph nodes: Cervical, supraclavicular, and axillary nodes normal. Resp: clear to auscultation bilaterally Cardio: irregularly irregular rhythm, systolic murmur: early systolic 2/6, crescendo at 2nd left intercostal space and no rub GI: soft, non-tender; bowel sounds normal; no masses,  no organomegaly Extremities: extremities normal, atraumatic, no cyanosis or edema Neurologic: Grossly normal  Diagnostic Studies & Laboratory data:     Recent Radiology Findings:  CT Chest High Resolution  Result Date: 07/31/2019 CLINICAL DATA:  Abnormal pulmonary function tests on amiodarone therapy. Evaluate for interstitial lung disease. Status post TAVR. EXAM: CT CHEST WITHOUT CONTRAST TECHNIQUE: Multidetector CT imaging of the chest was performed following the standard protocol without intravenous contrast. High resolution imaging of the lungs, as well as inspiratory and expiratory imaging, was performed. COMPARISON:  05/22/2019 chest radiograph. 05/13/2019 chest CT angiogram. 12/09/2018 high-resolution chest CT. FINDINGS: Cardiovascular: Mild cardiomegaly. Aortic valve prosthesis in place. No significant pericardial effusion/thickening. Three-vessel coronary atherosclerosis status post CABG. Atherosclerotic nonaneurysmal thoracic aorta. Top-normal caliber main pulmonary artery (3.0 cm diameter). Mediastinum/Nodes: No discrete thyroid nodules. Unremarkable esophagus. No pathologically enlarged axillary, mediastinal or hilar lymph nodes, noting limited sensitivity for the detection of hilar adenopathy on this noncontrast study.  Lungs/Pleura: No pneumothorax. No pleural effusion. No acute consolidative airspace disease or lung masses. Peripheral right upper lobe solid 10 x 5 mm pulmonary nodule (series 3/image 53), previously 9 x 5 mm on 05/13/2019 CT and 5 x 3 mm on 12/09/2018 CT, increasing. No additional significant pulmonary nodules. There is patchy mild-to-moderate subpleural reticulation and ground-glass opacity in both lungs with a strong basilar predominance. No significant regions of traction bronchiectasis, architectural distortion or frank honeycombing. Comparison is limited by the presence of pleural effusions on the prior scan. Findings are not definitely progressed. No significant regions of air trapping or findings of tracheobronchomalacia on the expiration sequence. Upper abdomen: No acute abnormality. Musculoskeletal: No aggressive appearing focal osseous lesions. Intact sternotomy wires. Moderate thoracic spondylosis. IMPRESSION: 1. Peripheral right upper lobe 10 x 5 mm solid pulmonary nodule is slightly increased from 9 x 5 mm on 05/13/2019 CT and  is clearly increased from 5 x 3 mm on 12/09/2018 CT. Primary bronchogenic carcinoma not excluded. Consider PET-CT for further evaluation versus continued close chest CT surveillance in 3 months. 2. Spectrum of findings suggestive of basilar predominant interstitial lung disease without significant bronchiectasis or honeycombing, not clearly changed in the interval, see comments. Differential includes nonspecific interstitial pneumonia (NSIP) or usual interstitial pneumonia (UIP). Amiodarone pulmonary toxicity is on the differential. Findings are indeterminate for UIP per consensus guidelines: Diagnosis of Idiopathic Pulmonary Fibrosis: An Official ATS/ERS/JRS/ALAT Clinical Practice Guideline. [place], Iss 5, [phone], Nov 17 2016. 3. Mild cardiomegaly. 4. Aortic Atherosclerosis (ICD10-I70.0). Electronically Signed   By: Ilona Sorrel M.D.   On:  07/31/2019 17:35   NM PET Image Initial (PI) Skull Base To Thigh  Result Date: 09/01/2019 CLINICAL DATA:  Initial treatment strategy for pulmonary nodule. EXAM: NUCLEAR MEDICINE PET SKULL BASE TO THIGH TECHNIQUE: 8.3 mCi F-18 FDG was injected intravenously. Full-ring PET imaging was performed from the skull base to thigh after the radiotracer. CT data was obtained and used for attenuation correction and anatomic localization. Fasting blood glucose: 112 mg/dl COMPARISON:  CT chest 07/31/2019 FINDINGS: Mediastinal blood pool activity: SUV max 2.5 Liver activity: SUV max N/A NECK: No significant abnormal hypermetabolic activity in this region. Incidental CT findings: Bilateral common carotid atherosclerotic calcification. CHEST: The right upper lobe nodule of concern measures 1.0 by 0.5 cm on image 25/8 and has a maximum SUV of 3.8, compatible with malignancy. Incidental CT findings: Prosthetic aortic valve. Coronary, aortic arch, and branch vessel atherosclerotic vascular disease. Prior CABG. Mild to moderate cardiomegaly. ABDOMEN/PELVIS: No significant abnormal hypermetabolic activity in this region. Incidental CT findings: Aortoiliac atherosclerotic vascular disease. Mild proximal sigmoid colon diverticulosis. SKELETON: Mildly accentuated activity along Schmorl's nodes in the lumbar spine. Incidental CT findings: Lumbar spondylosis.  Cervical spondylosis. IMPRESSION: 1. The 1.0 by 0.5 cm right upper lobe nodule has a maximum SUV of 3.8, compatible with malignancy. No findings of hypermetabolic adenopathy or distant spread. 2. Other imaging findings of potential clinical significance: Aortic Atherosclerosis (ICD10-I70.0). Coronary atherosclerosis. Mild to moderate cardiomegaly. Aortic valve prosthesis. Prior CABG. Mild proximal sigmoid colon diverticulosis. Lumbar and cervical spondylosis. Electronically Signed   By: Van Clines M.D.   On: 09/01/2019 09:35   I have independently reviewed the above  radiology studies  and reviewed the findings with the patient.   Recent Lab Findings: Lab Results  Component Value Date   WBC 6.2 06/04/2019   HGB 13.0 06/04/2019   HCT 40.3 06/04/2019   PLT 87 (LL) 06/04/2019   GLUCOSE 84 06/25/2019   CHOL 193 06/17/2017   TRIG 114 06/17/2017   HDL 35 (L) 06/17/2017   LDLCALC 135 (H) 06/17/2017   ALT 17 05/22/2019   AST 25 05/22/2019   NA 142 06/25/2019   K 4.3 06/25/2019   CL 99 06/25/2019   CREATININE 1.97 (H) 06/25/2019   BUN 63 (H) 06/25/2019   CO2 24 06/25/2019   TSH 3.558 12/24/2017   INR 1.4 (H) 05/22/2019   HGBA1C 5.5 05/22/2019   PFT's February 2021 FEV1 2.43     95% predicted DLCO 15.32 66% predicted  ECHO: ECHOCARDIOGRAM REPORT     Patient Name:  [name] Date of Exam: 06/25/2019  Medical Rec #: [id number]   Height:    69.0 in  Accession #:  [id number]   Weight:    163.4 lb  Date of Birth: 1932-03-16   BSA:  1.896 m  Patient Age:  21 years    BP:      100/60 mmHg  Patient Gender: M       HR:      72 bpm.  Exam Location: [address]   Procedure: 2D Echo, Cardiac Doppler and Color Doppler   Indications:  Z95.2 Status post TAVR    History:    Patient has prior history of Echocardiogram examinations,  most         recent 05/27/2019. Status post Mitral Valve Repair-28 mm  Seguin         Ring Annuloplasty; Status post TAVR-29 mm Edwards Sapien;  Risk         Factors:Hypertension and Dyslipidemia. Atrial  Fibrillation. TIA.         Chronic kidney disease.    Sonographer:  Cresenciano Lick RDCS  Referring Phys: [id number] [place]    1. Left ventricular ejection fraction, by estimation, is <20%. The left  ventricle has severely decreased function. The left ventricle demonstrates  global hypokinesis. The left ventricular internal cavity size was mildly  dilated. Left ventricular    diastolic parameters are indeterminate.  2. S/p mitral valve repair. Mild-moderate mitral regurgitation. Mean  gradient 4 mmHg, mild mitral stenosis.  3. Bioprosthetic aortic valve s/p TAVR, 29 mm Edwards Sapien. Trivial  perivalvular regurgitation. Mean gradient 4 mmHg, no significant stenosis.  4. Left atrial size was moderately dilated.  5. Right ventricular systolic function is mildly reduced. The right  ventricular size is mildly enlarged. There is mildly elevated pulmonary  artery systolic pressure. The estimated right ventricular systolic  pressure is 88.4 mmHg.  6. The inferior vena cava is normal in size with <50% respiratory  variability, suggesting right atrial pressure of 8 mmHg.   FINDINGS  Left Ventricle: Left ventricular ejection fraction, by estimation, is  <20%. The left ventricle has severely decreased function. The left  ventricle demonstrates global hypokinesis. The left ventricular internal  cavity size was mildly dilated. There is no  left ventricular hypertrophy. Left ventricular diastolic parameters are  indeterminate.   Right Ventricle: The right ventricular size is mildly enlarged. No  increase in right ventricular wall thickness. Right ventricular systolic  function is mildly reduced. There is mildly elevated pulmonary artery  systolic pressure. The tricuspid regurgitant  velocity is 2.62 m/s, and with an assumed right atrial pressure of 8  mmHg, the estimated right ventricular systolic pressure is 16.6 mmHg.   Left Atrium: Left atrial size was moderately dilated.   Right Atrium: Right atrial size was normal in size.   Pericardium: There is no evidence of pericardial effusion.   Mitral Valve: S/p mitral valve repair. Mild-moderate mitral regurgitation.  Mean gradient 4 mmHg, mild mitral stenosis. The mitral valve has been  repaired/replaced. Mild to moderate mitral valve regurgitation. MV peak  gradient, 8.1 mmHg. The mean mitral  valve  gradient is 4.0 mmHg.   Tricuspid Valve: The tricuspid valve is normal in structure. Tricuspid  valve regurgitation is mild.   Aortic Valve: Bioprosthetic aortic valve s/p TAVR, 29 mm Edwards Sapien.  Trivial perivalvular regurgitation. Mean gradient 4 mmHg, no significant  stenosis. The aortic valve has been repaired/replaced. Aortic valve  regurgitation is trivial. Aortic valve  mean gradient measures 4.2 mmHg. Aortic valve peak gradient measures 6.1  mmHg. Aortic valve area, by VTI measures 1.97 cm.   Pulmonic Valve: The pulmonic valve was normal in structure. Pulmonic valve  regurgitation is not visualized.  Aorta: The aortic root is normal in size and structure.   Venous: The inferior vena cava is normal in size with less than 50%  respiratory variability, suggesting right atrial pressure of 8 mmHg.   IAS/Shunts: No atrial level shunt detected by color flow Doppler.     LEFT VENTRICLE  PLAX 2D  LVIDd:     6.30 cm  LVIDs:     4.80 cm  LV PW:     0.80 cm  LV IVS:    0.70 cm  LVOT diam:   2.30 cm  LV SV:     46  LV SV Index:  24  LVOT Area:   4.15 cm     RIGHT VENTRICLE  RV Basal diam: 5.30 cm  RV S prime:   6.85 cm/s  TAPSE (M-mode): 0.9 cm   LEFT ATRIUM       Index    RIGHT ATRIUM      Index  LA diam:    5.20 cm 2.74 cm/m RA Area:   17.10 cm  LA Vol (A2C):  103.0 ml 54.33 ml/m RA Volume:  47.90 ml 25.26 ml/m  LA Vol (A4C):  79.7 ml 42.04 ml/m  LA Biplane Vol: 92.3 ml 48.68 ml/m  AORTIC VALVE  AV Area (Vmax):  1.76 cm  AV Area (Vmean):  1.61 cm  AV Area (VTI):   1.97 cm  AV Vmax:      123.50 cm/s  AV Vmean:     96.975 cm/s  AV VTI:      0.231 m  AV Peak Grad:   6.1 mmHg  AV Mean Grad:   4.2 mmHg  LVOT Vmax:     52.30 cm/s  LVOT Vmean:    37.500 cm/s  LVOT VTI:     0.110 m  LVOT/AV VTI ratio: 0.47    AORTA  Ao Root diam: 3.40 cm   MITRAL  VALVE       TRICUSPID VALVE  MV Area (PHT): 1.73 cm  TR Peak grad:  27.5 mmHg  MV Peak grad: 8.1 mmHg  TR Vmax:    262.00 cm/s  MV Mean grad: 4.0 mmHg  MV Vmax:    1.42 m/s  SHUNTS  MV Vmean:   92.9 cm/s Systemic VTI: 0.11 m  MR Peak grad: 52.4 mmHg  Systemic Diam: 2.30 cm  MR Mean grad: 39.0 mmHg  MR Vmax:   362.00 cm/s  MR Vmean:   303.0 cm/s  MV E velocity: 1.48 cm/s   Loralie Champagne MD  Electronically signed by Loralie Champagne MD  Signature Date/Time: 06/25/2019/9:19:56 PM    CATH: before TAVR 1.  Nonobstructive coronary disease with mild diffuse stenosis of the LAD, left circumflex, and RCA 2.  Total occlusion of the first OM branch of the circumflex with continued patency of the saphenous vein graft to OM 3.  Severe aortic stenosis with mean gradient 33 mmHg and calculated valve area 0.8 cm 4.  Large V waves consistent with severe mitral regurgitation  Recommendation: Continue multidisciplinary evaluation for this patient with severe low-flow low gradient aortic stenosis, probable severe mitral regurgitation with previous mitral repair, and progressive heart failure symptoms   Assessment / Plan:   #1 slowly enlarging peripheral right upper lobe lung nodule suspicious for malignancy change in size and PET scan-patient referred by Dr. Lamonte Sakai to consider preoperative marking and wedge resection-I discussed this approach with the patient and his wife.  With the peripheral location of the lesion marking and robotic wedge  resection and node sampling is reasonable approach-especially with the patient's significant improvement in his overall functional capacity with the recent TAVR valve. #2 history of coronary artery disease mitral regurgitation and aortic stenosis-status post coronary artery bypass and mitral valve repair 17 years ago and TAVR valve earlier this year #3 low ejection fraction-last echo notes less than 20%-we will plan repeat echocardiogram to  evaluate LV function before proceeding with surgery-especially in light of the patient's significant symptomatic improvement since his TAVR valve. #4 chronic anticoagulation on Eliquis 2.5 twice a day for atrial fibrillation    Risks and options of surgical resection versus attempted needle biopsy or navigational bronc biopsy and subsequent treatment with radiation have been discussed with the patient and his wife.  He is agreeable and would prefer surgical resection.  With his underlying pulmonary disease and cardiac disease and age would not recommend lobectomy.   I  spent 60 minutes with  the patient face to face and greater then 50% of the time was spent in counseling and coordination of care.    Grace Isaac MD      [address] [place] [address] Office [phone]     10/06/2019 8:52 AM

## 2019-10-02 ENCOUNTER — Telehealth: Payer: Self-pay | Admitting: Pulmonary Disease

## 2019-10-02 ENCOUNTER — Ambulatory Visit: Payer: PPO | Admitting: Cardiothoracic Surgery

## 2019-10-02 ENCOUNTER — Other Ambulatory Visit: Payer: Self-pay | Admitting: *Deleted

## 2019-10-02 ENCOUNTER — Telehealth: Payer: Self-pay | Admitting: Cardiology

## 2019-10-02 ENCOUNTER — Other Ambulatory Visit: Payer: Self-pay

## 2019-10-02 ENCOUNTER — Encounter: Payer: Self-pay | Admitting: *Deleted

## 2019-10-02 VITALS — BP 106/62 | HR 78 | Temp 97.0°F | Resp 20 | Ht 68.0 in | Wt 165.0 lb

## 2019-10-02 DIAGNOSIS — Z952 Presence of prosthetic heart valve: Secondary | ICD-10-CM

## 2019-10-02 DIAGNOSIS — R911 Solitary pulmonary nodule: Secondary | ICD-10-CM

## 2019-10-02 DIAGNOSIS — Z951 Presence of aortocoronary bypass graft: Secondary | ICD-10-CM

## 2019-10-02 DIAGNOSIS — Z01818 Encounter for other preprocedural examination: Secondary | ICD-10-CM

## 2019-10-02 NOTE — Telephone Encounter (Signed)
-----   Message from Laury Deep, RN sent at 10/02/2019  2:00 PM EDT ----- Dr. Valeta Harms,   This patient is booked for Right robotic wedge resection on Wednesday 7/28 @ 7:30am in OR 10 by Dr. Servando Snare.  You can book your Nav Bronch for same day.  Thanks- [employer]

## 2019-10-02 NOTE — Telephone Encounter (Signed)
    Pt's wife calling she said she missed a call again from [place]. Advised it must have been the reminder call for upcoming echo on 07/20. Gave time and date. Advised its going to be in  [facility]. Pt understood.

## 2019-10-02 NOTE — Telephone Encounter (Signed)
Follow Up:   Wife said she was returning a call, but she did not know who called her.

## 2019-10-02 NOTE — Patient Instructions (Signed)
Pulmonary Nodule A pulmonary nodule is a small, round growth of tissue in the lung. It is sometimes referred to as a "shadow" or "spot on the lung." Nodules range in size from less than 1/5 of an inch (4 mm) to a little bigger than an inch (30 mm). Pulmonary nodules can be either noncancerous (benign) or cancerous (malignant). Most are noncancerous. Smaller nodules in people who do not smoke and do not have any other risk factors for lung cancer are more likely to be noncancerous. Larger, irregular nodules in people who smoke or who have a strong family history of lung cancer are more likely to be cancerous. What are the causes? This condition may be caused by:  A bacterial, fungal, or viral infection, such as tuberculosis. The infection is usually an old and inactive one.  A noncancerous mass of tissue.  Inflammation from conditions such as rheumatoid arthritis.  Abnormal blood vessels in the lungs.  Cancerous tissue, such as lung cancer or a cancer in another part of the body that has spread to the lung. What are the signs or symptoms? This condition usually does not cause symptoms. If symptoms appear, they are usually related to the underlying cause. For example, if the condition is caused by an infection, you may have a cough or fever. How is this diagnosed? This condition is usually diagnosed with an X-ray or CT scan. To help determine whether a pulmonary nodule is benign or malignant, your health care provider will:  Take your medical history.  Perform a physical exam.  Order tests, including: ? Blood tests. ? A skin test called a tuberculin test. This test is done to check if you have been exposed to the germ that causes tuberculosis. ? Chest X-rays. ? A CT scan. This test shows smaller pulmonary nodules more clearly and with more detail than an X-ray. ? A positron emission tomography (PET) scan. This test is done to check if the nodule is cancerous. During the test, a safe amount  of a radioactive substance is injected into the bloodstream. Then a picture is taken. ? Biopsy. In this test, a tiny piece of the pulmonary nodule is removed and then examined under a microscope. How is this treated? Treatment for this condition depends on whether the pulmonary nodule is malignant or benign as well as your risk of getting cancer.  Noncancerous nodules usually do not need to be treated, but they may need to be monitored with CT scans. If a CT scan shows that the pulmonary nodule got bigger, more tests may be done.  Some nodules need to be removed. If this is the case, you may have a procedure called a thoractomy. During the procedure, your health care provider will make an incision in your chest and remove the part of the lung where the nodule is located. Follow these instructions at home:   Take over-the-counter and prescription medicines only as told by your health care provider.  Do not use any products that contain nicotine or tobacco, such as cigarettes and e-cigarettes. If you need help quitting, ask your health care provider.  Keep all follow-up visits as told by your health care provider. This is important. Contact a health care provider if:  You have trouble breathing when you are active.  You feel sick or unusually tired.  You do not feel like eating.  You lose weight without trying.  You develop chills or night sweats. Get help right away if:  You cannot catch your breath.    You begin wheezing.  You cannot stop coughing.  You cough up blood.  You become dizzy or feel like you are going to faint.  You have sudden chest pain.  You have a fever or persistent symptoms for more than 2-3 days.  You have a fever and your symptoms suddenly get worse. Summary  A pulmonary nodule is a small, round growth of tissue in the lung. Most pulmonary nodules are noncancerous.  This condition is usually diagnosed with an X-ray or CT scan.  Common causes of  pulmonary nodules include infection, inflammation, and noncancerous growths.  Though less common, if a nodule is found to be cancerous, you will need specific diagnostic tests and treatment options as directed by your medical provider.  Treatment for this condition depends on whether the pulmonary nodule is benign or malignant as well as your risk of getting cancer. This information is not intended to replace advice given to you by your health care provider. Make sure you discuss any questions you have with your health care provider. Document Revised: 03/29/2017 Document Reviewed: 04/03/2016 Elsevier Patient Education  2020 Elsevier Inc.   Lung Cancer Lung cancer is an abnormal growth of cancerous cells that forms a mass (malignant tumor) in a lung. There are several types of lung cancer. The types are based on the appearance of the tumor cells. The two most common types are:  Non-small cell lung cancer. This type of lung cancer is the most common type. Non-small cell lung cancers include squamous cell carcinoma, adenocarcinoma, and large cell carcinoma.  Small cell lung cancer. In this type of lung cancer, abnormal cells are smaller than those of non-small cell lung cancer. Small cell lung cancer gets worse (progresses) faster than non-small cell lung cancer. What are the causes? The most common cause of lung cancer is smoking tobacco. The second most common cause is exposure to a chemical called radon. What increases the risk? You are more likely to develop this condition if:  You smoke tobacco.  You have been exposed to: ? Secondhand tobacco smoke. ? Radon gas. ? Uranium. ? Asbestos. ? Arsenic in drinking water. ? Air pollution.  You have a family or personal history of lung cancer.  You have had lung radiation therapy in the past.  You are older than age 65. What are the signs or symptoms? In the early stages, you may not have any symptoms. As the cancer progresses, symptoms  may include:  A lasting cough, possibly with blood.  Fatigue.  Unexplained weight loss.  Shortness of breath.  Loud breathing (wheezing).  Chest pain.  Loss of appetite. Symptoms of advanced lung cancer include:  Hoarseness.  Bone or joint pain.  Weakness.  Change in the structure of the fingernails (clubbing), so that the nail looks like an upside-down spoon.  Swelling of the face or arms.  Inability to move the face (paralysis).  Drooping eyelids. How is this diagnosed? This condition may be diagnosed based on:  Your symptoms and medical history.  A physical exam.  A chest X-ray.  A CT scan.  Blood tests.  Sputum tests.  Removal of a sample of lung tissue (lung biopsy) for testing. Your cancer will be assessed (staged) to determine how severe it is and how much it has spread (metastasized). How is this treated? Treatment depends on the type and stage of your cancer. Treatment may include one or more of the following:  Surgery to remove as much of the cancer as possible. Lymph   nodes in the area may be removed and tested for cancer as well.  Medicines that kill cancer cells (chemotherapy).  High-energy rays that kill cancer cells (radiation therapy).  Chemotherapy. This treatment uses medicines to destroy cancer cells.  Targeted therapy. This targets specific parts of cancer cells and the area around them to block the growth and spread of the cancer. Targeted therapy can help limit the damage to healthy cells. Follow these instructions at home: Eating and drinking  Some of your treatments might affect your appetite. If you are having problems eating, or if you do not have an appetite, meet with a dietitian.  If you have side effects that affect your appetite, it may help to: ? Eat smaller meals and snacks often. ? Drink high-nutrition and high-calorie shakes or supplements. ? Eat bland and soft foods that are easy to eat. ? Avoid eating foods that  are hot, spicy, or hard to swallow. General instructions   Do not use any products that contain nicotine or tobacco, such as cigarettes and e-cigarettes. If you need help quitting, ask your health care provider.  Do not drink alcohol.  If you are admitted to the hospital, make sure your cancer specialist (oncologist) is aware. Your cancer may affect your treatment for other conditions.  Take over-the-counter and prescription medicines only as told by your health care provider.  Consider joining a support group for people who have been diagnosed with lung cancer.  Work with your health care provider to manage any side effects of treatment.  Keep all follow-up visits as told by your health care provider. This is important. Where to find more information  American Cancer Society: https://www.cancer.org  National Cancer Institute (NCI): https://www.cancer.gov Contact a health care provider if you:  Lose weight without trying.  Have a persistent cough and wheezing.  Feel short of breath.  Get tired easily.  Have bone or joint pain.  Have difficulty swallowing.  Notice that your voice is changing or getting hoarse.  Have pain that does not get better with medicine. Get help right away if you:  Cough up blood.  Have new breathing problems.  Have chest pain.  Have a fever.  Have swelling in an ankle, leg, or arm, or the face or neck.  Have paralysis in your face.  Are very confused.  Have a drooping eyelid. Summary  Lung cancer is an abnormal growth of cancerous cells that forms a mass (malignant tumor) in a lung.  There are several types of lung cancer. The types are based on the appearance of the tumor cells. The two most common types are non-small cell and small cell.  The most common cause of lung cancer is smoking tobacco.  Early symptoms include a lasting cough, possibly with blood, and fatigue, unexplained weight loss, and shortness of breath.  After  diagnosis, treatment depends on the type and stage of your cancer. This information is not intended to replace advice given to you by your health care provider. Make sure you discuss any questions you have with your health care provider. Document Revised: 02/15/2017 Document Reviewed: 01/10/2017 Elsevier Patient Education  2020 Elsevier Inc.  Video-Assisted Thoracic Surgery Video-assisted thoracic surgery (VATS) is a procedure that allows your surgeon to look inside your chest and perform minor procedures as needed. The thoracic area is between the neck and abdomen. VATS is commonly done to:  Study or diagnose problems in the chest.  Remove a tissue sample (biopsy) to be examined.  Put medicines   directly into the chest.  Remove collections of fluid, pus, or blood.  Remove tumors.  Remove a part of a lung (lobectomy).  Treat some problems with the spine, including: ? Abnormal curves (scoliosis or kyphosis). ? Breaks (fractures). ? Tumors. VATS is done using thoracoscopy. Thoracoscopy is a procedure in which a thin tube with a light and camera on the end (thoracoscope) is inserted through a small incision in the chest wall. The thoracoscope sends images to a video monitor that your surgeon will use to view the inside of your chest. Tell a health care provider about:  Any allergies you have.  All medicines you are taking, including vitamins, herbs, eye drops, creams, and over-the-counter medicines.  Any problems you or family members have had with anesthetic medicines.  Any surgeries you have had.  Any medical conditions you have.  Any blood disorders you have.  Whether you are pregnant or may be pregnant. What are the risks? Generally, this is a safe procedure. However, problems may occur, including:  Infection.  Severe bleeding (hemorrhage).  Allergic reaction to medicines.  Damage to structures or organs in the chest, such as nerves.  Lung infection  (pneumonia).  Inability to complete the procedure. If this is the case, your chest may need to be opened with a large incision (thoracotomy). What happens before the procedure? Medicines  Ask your health care provider about: ? Changing or stopping your regular medicines. This is especially important if you are taking diabetes medicines or blood thinners. ? Taking medicines such as aspirin and ibuprofen. These medicines can thin your blood. Do not take these medicines before your procedure if your health care provider instructs you not to.  You may be given antibiotic medicine to help prevent infection. Staying hydrated Follow instructions from your health care provider about hydration, which may include:  Up to 2 hours before the procedure - you may continue to drink clear liquids, such as water, clear fruit juice, black coffee, and plain tea. Eating and drinking restrictions Follow instructions from your health care provider about eating and drinking, which may include:  8 hours before the procedure - stop eating heavy meals or foods such as meat, fried foods, or fatty foods.  6 hours before the procedure - stop eating light meals or foods, such as toast or cereal.  6 hours before the procedure - stop drinking milk or drinks that contain milk.  2 hours before the procedure - stop drinking clear liquids. General instructions  Ask your health care provider how your surgical site will be marked or identified.  You may be asked to shower with a germ-killing soap.  You may have a blood or urine sample taken.  You may have imaging tests, such as: ? Chest X-ray. ? Electrocardiogram (ECG). ? Ultrasound. ? CT scan.  Do not use any products that contain nicotine or tobacco for as long as possible before your procedure. These include cigarettes and e-cigarettes. If you need help quitting, ask your health care provider.  Plan to have someone take you home from the hospital or  clinic.  If you will be going home right after the procedure, plan to have someone with you for 24 hours. What happens during the procedure?  To lower your risk of infection: ? Your health care team will wash or sanitize their hands. ? Your skin will be washed with soap.  An IV tube will be inserted into one of your veins.  You will be given one or   more of the following: ? A medicine to make you fall asleep (general anesthetic). ? A medicine to help you relax (sedative). ? A medicine that is injected into an area of your body to numb everything below the injection site (regional anesthetic). This is less common for this procedure. It may be given if you are not able to tolerate general anesthetic.  A thin tube (catheter) will be inserted into your bladder and your tube that carries urine out of your body (urethra). The catheter will drain your urine.  1-4 small incisions will be made in your chest. The number of incisions depends on the purpose of your procedure.  The thoracoscope will be inserted into your incision(s) and used to view the inside of your chest.  One of your lungs will be deflated. This makes it easier for your surgeon to see the area.  Other surgical instruments may be inserted through your incision(s) to perform necessary procedures.  After any procedures are done, your lung will be inflated.  A chest tube may be inserted through an incision to drain excess fluid from the surgical area.  Any remaining incisions will be closed with stitches (sutures) or staples.  A bandage (dressing) may be placed over your incision(s). The procedure may vary among health care providers and hospitals. What happens after the procedure?  Your blood pressure, heart rate, breathing rate, and blood oxygen level will be monitored until the medicines you were given have worn off.  You may have a chest tube draining fluid from the surgical area. The chest tube will be closely monitored  for signs of fluid or air buildup in your lungs.  You may continue to receive fluids and medicines through an IV tube.  You may have to wear compression stockings. These stockings help to prevent blood clots and reduce swelling in your legs.  Do not drive for 24 hours if you were given a sedative. Summary  Video-assisted thoracic surgery (VATS) is a procedure that allows your surgeon to look inside your chest to study or diagnose problems in your thoracic area.  VATS is done by inserting a thin tube with a light and camera on the end (thoracoscope) through a small incision in the chest wall.  After the procedure, you may have a chest tube draining fluid from the surgical area. The chest tube will be closely monitored for signs of fluid or air buildup in your lungs. This information is not intended to replace advice given to you by your health care provider. Make sure you discuss any questions you have with your health care provider. Document Revised: 02/15/2017 Document Reviewed: 02/20/2016 Elsevier Patient Education  2020 Elsevier Inc.   Video-Assisted Thoracic Surgery, Care After This sheet gives you information about how to care for yourself after your procedure. Your doctor may also give you more specific instructions. If you have problems or questions, contact your doctor. What can I expect after the procedure? After the procedure, it is common to have:  Some pain and soreness in your chest.  Pain when you breathe in (inhale) and cough.  Trouble pooping (constipation).  Tiredness (fatigue).  Trouble sleeping. Follow these instructions at home: Preventing lung infection (pneumonia)  Take deep breaths or do breathing exercises as told by your doctor.  Cough often. Coughing is important to clear thick spit (phlegm) and open your lungs.  You can make coughing hurt less if you try supporting (splinting) your chest. Try one of these when you cough: ? Hold   a pillow against  your chest. ? Place both hands flat on top of your cut.  Use an incentive spirometer as told by your doctor. This is a tool that measures how well you can fill your lungs with each breath.  Do lung therapy (pulmonary rehabilitation) as told. Medicines  Take over-the-counter or prescription medicines only as told by your doctor.  If you have pain, take pain-relieving medicine before your pain gets very bad. Doing this will help you breathe and cough more comfortably.  If you were prescribed an antibiotic medicine, take it as told by your doctor. Do not stop taking the antibiotic even if you start to feel better. Activity  Ask your doctor what activities are safe for you.  Avoid activities that use your chest muscles for 3-4 weeks or longer.  Do not lift anything that is heavier than 10 lb (4.5 kg), or the limit that your doctor tells you, until he or she says that it is safe. Cut (incision) care  Follow instructions from your doctor about how to take care of your cut(s) from surgery. Make sure you: ? Wash your hands with soap and water before you change your bandage (dressing). If you cannot use soap and water, use hand sanitizer. ? Change your bandage as told by your doctor. ? Leave stitches (sutures), skin glue, or skin tape (adhesive) strips in place. They may need to stay in place for 2 weeks or longer. If tape strips get loose and curl up, you may trim the loose edges. Do not remove tape strips completely unless your doctor says it is okay.  Keep your bandage dry until it has been removed.  Every day, check the area around your cut(s) for signs of infection. Check for: ? Redness, swelling, or pain. ? Fluid or blood. ? Warmth. ? Pus or a bad smell. Bathing  Do not take baths, swim, or use a hot tub until your doctor approves. You may take showers.  After your bandage is removed, use soap and water to gently wash the your cut(s) from surgery. Do not use anything else to clean  your cut(s) unless your doctor tells you to do this. Driving   Do not drive until your doctor approves.  Do not drive or use heavy machinery while taking prescription pain medicine. Eating and drinking  Eat a healthy diet as told by your doctor. A healthy diet includes: ? Lots of fresh fruits and vegetables. ? Whole grains. ? Low-fat (lean) proteins.  Limit foods that are high in fat and processed sugars. These include fried and sweet foods.  Drink enough fluid to keep your pee (urine) clear or light yellow. General instructions   To prevent or treat trouble pooping while you are taking prescription pain medicine, your doctor may recommend that you: ? Take over-the-counter or prescription medicines. ? Eat foods that have a lot of fiber. These include beans, fresh fruits and vegetables, and whole grains.  Do not use any products that contain nicotine or tobacco. These include cigarettes and e-cigarettes. If you need help quitting, ask your doctor.  Avoid being where people are smoking (avoid secondhand smoke).  Wear compression stockings as told by your doctor. These stockings help you: ? Not get blood clots in your legs. ? Have less swelling in your legs.  If you have a chest tube, care for it as told by your doctor.  Do not travel by airplane during the 2 weeks after your chest tube is removed, or   until your doctor says that this is safe.  Keep all follow-up visits as told by your doctor. This is important. Contact a doctor if:  You have redness, swelling, or pain around a cut from surgery.  You have fluid or blood coming from a cut from surgery.  Your cut(s) from surgery feel warm to the touch.  You have pus or a bad smell coming from a cut from surgery.  You have a fever or chills.  You feel sick to your stomach (nauseous).  You throw up (vomit).  You have pain that does not get better with medicine. Get help right away if:  You have chest pain.  Your  heart is fluttering or beating fast.  You start to have a rash.  You have shortness of breath.  You have trouble breathing.  You are confused.  You have trouble talking or understanding.  You feel weak, light-headed, or dizzy.  You faint. Summary  To help prevent lung infection (pneumonia), take deep breaths or do breathing exercises as told by your doctor.  Cough often. This is important for clearing chest fluid (phlegm). You can make coughing hurt less if you hold a pillow to your chest or put your hands flat on the cut(s) when you cough (do splinting).  Do not drive until your doctor approves.  Every day, check your cut(s) for signs of infection. These signs can be redness, swelling, pain, fluid, blood, warmth, pus, or a bad smell.  Eat a healthy diet. This includes lots of fresh fruits and vegetables, whole grains, and low-fat (lean) proteins. This information is not intended to replace advice given to you by your health care provider. Make sure you discuss any questions you have with your health care provider. Document Revised: 02/15/2017 Document Reviewed: 02/13/2016 Elsevier Patient Education  2020 [employer]vier Inc.  

## 2019-10-02 NOTE — Telephone Encounter (Signed)
PCCM:  Orders placed for super D Ct and ENB.  Patient is aware of procedure.  Combination case ENB+ RATS with Dr. Merilyn Baba, DO [facility] 10/02/2019 4:52 PM

## 2019-10-02 NOTE — Telephone Encounter (Signed)
Left a detailed message on phone. Notified I did  not see a note in the chart where we had tried to contact them. Notified if they have any questions to contact our office.

## 2019-10-05 ENCOUNTER — Telehealth: Payer: Self-pay | Admitting: Pulmonary Disease

## 2019-10-05 NOTE — Telephone Encounter (Signed)
Patient returning call, looks like someone was trying to reach them regarding appointment for CT scan. Appointment time reviewed with wife [name].

## 2019-10-06 ENCOUNTER — Ambulatory Visit (HOSPITAL_COMMUNITY)
Admission: RE | Admit: 2019-10-06 | Discharge: 2019-10-06 | Disposition: A | Discharge status: Home / Self Care | Payer: PPO | Source: Ambulatory Visit | Attending: Cardiothoracic Surgery | Admitting: Cardiothoracic Surgery

## 2019-10-06 ENCOUNTER — Other Ambulatory Visit: Payer: Self-pay

## 2019-10-06 DIAGNOSIS — I129 Hypertensive chronic kidney disease with stage 1 through stage 4 chronic kidney disease, or unspecified chronic kidney disease: Secondary | ICD-10-CM | POA: Diagnosis not present

## 2019-10-06 DIAGNOSIS — E785 Hyperlipidemia, unspecified: Secondary | ICD-10-CM | POA: Diagnosis not present

## 2019-10-06 DIAGNOSIS — Z01818 Encounter for other preprocedural examination: Secondary | ICD-10-CM | POA: Diagnosis not present

## 2019-10-06 DIAGNOSIS — Z952 Presence of prosthetic heart valve: Secondary | ICD-10-CM | POA: Diagnosis not present

## 2019-10-06 DIAGNOSIS — I351 Nonrheumatic aortic (valve) insufficiency: Secondary | ICD-10-CM | POA: Diagnosis not present

## 2019-10-06 DIAGNOSIS — R911 Solitary pulmonary nodule: Secondary | ICD-10-CM

## 2019-10-06 DIAGNOSIS — N189 Chronic kidney disease, unspecified: Secondary | ICD-10-CM | POA: Diagnosis not present

## 2019-10-06 LAB — ECHOCARDIOGRAM COMPLETE
AR max vel: 2.75 cm2
AV Area VTI: 2.62 cm2
AV Area mean vel: 2.75 cm2
AV Mean grad: 3.6 mmHg
AV Peak grad: 6.7 mmHg
Ao pk vel: 1.3 m/s
S' Lateral: 5.1 cm

## 2019-10-06 NOTE — Progress Notes (Signed)
  Echocardiogram 2D Echocardiogram has been performed.  Marybelle Killings 10/06/2019, 10:17 AM

## 2019-10-08 ENCOUNTER — Other Ambulatory Visit: Payer: Self-pay | Admitting: Physician Assistant

## 2019-10-09 ENCOUNTER — Other Ambulatory Visit: Payer: Self-pay

## 2019-10-09 ENCOUNTER — Ambulatory Visit (INDEPENDENT_AMBULATORY_CARE_PROVIDER_SITE_OTHER)
Admission: RE | Admit: 2019-10-09 | Discharge: 2019-10-09 | Disposition: A | Discharge status: Home / Self Care | Payer: PPO | Source: Ambulatory Visit | Attending: Pulmonary Disease | Admitting: Pulmonary Disease

## 2019-10-09 ENCOUNTER — Telehealth: Payer: PPO | Admitting: Cardiology

## 2019-10-09 DIAGNOSIS — I517 Cardiomegaly: Secondary | ICD-10-CM | POA: Diagnosis not present

## 2019-10-09 DIAGNOSIS — R911 Solitary pulmonary nodule: Secondary | ICD-10-CM | POA: Diagnosis not present

## 2019-10-09 DIAGNOSIS — I700 Atherosclerosis of aorta: Secondary | ICD-10-CM | POA: Diagnosis not present

## 2019-10-09 DIAGNOSIS — R918 Other nonspecific abnormal finding of lung field: Secondary | ICD-10-CM | POA: Diagnosis not present

## 2019-10-11 NOTE — Progress Notes (Signed)
[employer] Dr. Valeta Harms for orders.

## 2019-10-11 NOTE — Pre-Procedure Instructions (Signed)
Your procedure is scheduled on Wednesday, July 28.  Report to [facility] Main Entrance "A" at 05:45 A.M., and check in at the Admitting office.  Call this number if you have problems the morning of surgery:  [phone]  Call [phone] if you have any questions prior to your surgery date Monday-Friday 8am-4pm.    Remember:  Do not eat or drink after midnight the night before your surgery.     Take these medicines the morning of surgery with A SIP OF WATER: levothyroxine (SYNTHROID) metoprolol succinate (TOPROL-XL) pravastatin (PRAVACHOL)   *Follow your surgeon's instructions on when to stop apixaban (ELIQUIS).  If no instructions were given by your surgeon then you will need to call the office to get those instructions.    As of today, STOP taking any Aspirin (unless otherwise instructed by your surgeon) Aleve, Naproxen, Ibuprofen, Motrin, Advil, Goody's, BC's, all herbal medications, fish oil, and all vitamins.        The Morning of Surgery:              Do not wear jewelry.            Do not wear lotions, powders, colognes, or deodorant.            Men may shave face and neck.            Do not bring valuables to the hospital.            [facility] is not responsible for any belongings or valuables.  Do NOT Smoke (Tobacco/Vaping) or drink Alcohol 24 hours prior to your procedure If you use a CPAP at night, you may bring all equipment for your overnight stay.   Contacts, glasses, dentures or bridgework may not be worn into surgery.      For patients admitted to the hospital, discharge time will be determined by your treatment team.   Patients discharged the day of surgery will not be allowed to drive home, and someone needs to stay with them for 24 hours.    Special instructions:   [place]- Preparing For Surgery  Before surgery, you can play an important role. Because skin is not sterile, your skin needs to be as free of germs as possible. You can reduce the  number of germs on your skin by washing with CHG (chlorahexidine gluconate) Soap before surgery.  CHG is an antiseptic cleaner which kills germs and bonds with the skin to continue killing germs even after washing.    Oral Hygiene is also important to reduce your risk of infection.  Remember - BRUSH YOUR TEETH THE MORNING OF SURGERY WITH YOUR REGULAR TOOTHPASTE  Please do not use if you have an allergy to CHG or antibacterial soaps. If your skin becomes reddened/irritated stop using the CHG.  Do not shave (including legs and underarms) for at least 48 hours prior to first CHG shower. It is OK to shave your face.  Please follow these instructions carefully.   1. Shower the NIGHT BEFORE SURGERY and the MORNING OF SURGERY with CHG Soap.   2. If you chose to wash your hair, wash your hair first as usual with your normal shampoo.  3. After you shampoo, rinse your hair and body thoroughly to remove the shampoo.  4. Use CHG as you would any other liquid soap. You can apply CHG directly to the skin and wash gently with a scrungie or a clean washcloth.   5. Apply the CHG Soap to  your body ONLY FROM THE NECK DOWN.  Do not use on open wounds or open sores. Avoid contact with your eyes, ears, mouth and genitals (private parts). Wash Face and genitals (private parts)  with your normal soap.   6. Wash thoroughly, paying special attention to the area where your surgery will be performed.  7. Thoroughly rinse your body with warm water from the neck down.  8. DO NOT shower/wash with your normal soap after using and rinsing off the CHG Soap.  9. Pat yourself dry with a CLEAN TOWEL.  10. Wear CLEAN PAJAMAS to bed the night before surgery  11. Place CLEAN SHEETS on your bed the night of your first shower and DO NOT SLEEP WITH PETS.   Day of Surgery: Wear Clean/Comfortable clothing the morning of surgery Do not apply any deodorants/lotions.   Remember to brush your teeth WITH YOUR REGULAR TOOTHPASTE.    Please read over the following fact sheets that you were given.

## 2019-10-12 ENCOUNTER — Other Ambulatory Visit: Payer: Self-pay

## 2019-10-12 ENCOUNTER — Ambulatory Visit (HOSPITAL_COMMUNITY)
Admission: RE | Admit: 2019-10-12 | Discharge: 2019-10-12 | Disposition: A | Discharge status: Home / Self Care | Payer: PPO | Source: Ambulatory Visit | Attending: Cardiothoracic Surgery | Admitting: Cardiothoracic Surgery

## 2019-10-12 ENCOUNTER — Encounter (HOSPITAL_COMMUNITY)
Admission: RE | Admit: 2019-10-12 | Discharge: 2019-10-12 | Disposition: A | Discharge status: Home / Self Care | Payer: PPO | Source: Ambulatory Visit | Attending: Cardiothoracic Surgery | Admitting: Cardiothoracic Surgery

## 2019-10-12 ENCOUNTER — Telehealth: Payer: Self-pay | Admitting: Pulmonary Disease

## 2019-10-12 ENCOUNTER — Other Ambulatory Visit (HOSPITAL_COMMUNITY)
Admission: RE | Admit: 2019-10-12 | Discharge: 2019-10-12 | Disposition: A | Discharge status: Home / Self Care | Payer: PPO | Source: Ambulatory Visit | Attending: Pulmonary Disease | Admitting: Pulmonary Disease

## 2019-10-12 ENCOUNTER — Encounter (HOSPITAL_COMMUNITY): Payer: Self-pay

## 2019-10-12 DIAGNOSIS — J90 Pleural effusion, not elsewhere classified: Secondary | ICD-10-CM | POA: Diagnosis not present

## 2019-10-12 DIAGNOSIS — R911 Solitary pulmonary nodule: Secondary | ICD-10-CM | POA: Diagnosis present

## 2019-10-12 DIAGNOSIS — Z951 Presence of aortocoronary bypass graft: Secondary | ICD-10-CM | POA: Insufficient documentation

## 2019-10-12 DIAGNOSIS — Z87891 Personal history of nicotine dependence: Secondary | ICD-10-CM | POA: Diagnosis not present

## 2019-10-12 DIAGNOSIS — J849 Interstitial pulmonary disease, unspecified: Secondary | ICD-10-CM | POA: Diagnosis present

## 2019-10-12 DIAGNOSIS — I5043 Acute on chronic combined systolic (congestive) and diastolic (congestive) heart failure: Secondary | ICD-10-CM | POA: Diagnosis not present

## 2019-10-12 DIAGNOSIS — I480 Paroxysmal atrial fibrillation: Secondary | ICD-10-CM | POA: Diagnosis not present

## 2019-10-12 DIAGNOSIS — E039 Hypothyroidism, unspecified: Secondary | ICD-10-CM | POA: Diagnosis present

## 2019-10-12 DIAGNOSIS — Z86010 Personal history of colonic polyps: Secondary | ICD-10-CM | POA: Diagnosis not present

## 2019-10-12 DIAGNOSIS — I4821 Permanent atrial fibrillation: Secondary | ICD-10-CM | POA: Diagnosis present

## 2019-10-12 DIAGNOSIS — G4700 Insomnia, unspecified: Secondary | ICD-10-CM | POA: Diagnosis not present

## 2019-10-12 DIAGNOSIS — E440 Moderate protein-calorie malnutrition: Secondary | ICD-10-CM | POA: Diagnosis present

## 2019-10-12 DIAGNOSIS — E785 Hyperlipidemia, unspecified: Secondary | ICD-10-CM | POA: Diagnosis present

## 2019-10-12 DIAGNOSIS — J8410 Pulmonary fibrosis, unspecified: Secondary | ICD-10-CM | POA: Diagnosis not present

## 2019-10-12 DIAGNOSIS — I10 Essential (primary) hypertension: Secondary | ICD-10-CM | POA: Diagnosis not present

## 2019-10-12 DIAGNOSIS — J9811 Atelectasis: Secondary | ICD-10-CM | POA: Diagnosis not present

## 2019-10-12 DIAGNOSIS — Z953 Presence of xenogenic heart valve: Secondary | ICD-10-CM | POA: Diagnosis not present

## 2019-10-12 DIAGNOSIS — N1832 Chronic kidney disease, stage 3b: Secondary | ICD-10-CM | POA: Diagnosis present

## 2019-10-12 DIAGNOSIS — I5030 Unspecified diastolic (congestive) heart failure: Secondary | ICD-10-CM | POA: Diagnosis not present

## 2019-10-12 DIAGNOSIS — I2510 Atherosclerotic heart disease of native coronary artery without angina pectoris: Secondary | ICD-10-CM | POA: Diagnosis present

## 2019-10-12 DIAGNOSIS — I5042 Chronic combined systolic (congestive) and diastolic (congestive) heart failure: Secondary | ICD-10-CM | POA: Diagnosis present

## 2019-10-12 DIAGNOSIS — I5022 Chronic systolic (congestive) heart failure: Secondary | ICD-10-CM | POA: Diagnosis not present

## 2019-10-12 DIAGNOSIS — N183 Chronic kidney disease, stage 3 unspecified: Secondary | ICD-10-CM | POA: Insufficient documentation

## 2019-10-12 DIAGNOSIS — J189 Pneumonia, unspecified organism: Secondary | ICD-10-CM | POA: Diagnosis not present

## 2019-10-12 DIAGNOSIS — E7800 Pure hypercholesterolemia, unspecified: Secondary | ICD-10-CM | POA: Diagnosis not present

## 2019-10-12 DIAGNOSIS — Z8249 Family history of ischemic heart disease and other diseases of the circulatory system: Secondary | ICD-10-CM | POA: Diagnosis not present

## 2019-10-12 DIAGNOSIS — Z823 Family history of stroke: Secondary | ICD-10-CM | POA: Diagnosis not present

## 2019-10-12 DIAGNOSIS — D696 Thrombocytopenia, unspecified: Secondary | ICD-10-CM | POA: Insufficient documentation

## 2019-10-12 DIAGNOSIS — Z79899 Other long term (current) drug therapy: Secondary | ICD-10-CM | POA: Diagnosis not present

## 2019-10-12 DIAGNOSIS — Z6825 Body mass index (BMI) 25.0-25.9, adult: Secondary | ICD-10-CM | POA: Diagnosis not present

## 2019-10-12 DIAGNOSIS — I130 Hypertensive heart and chronic kidney disease with heart failure and stage 1 through stage 4 chronic kidney disease, or unspecified chronic kidney disease: Secondary | ICD-10-CM | POA: Diagnosis present

## 2019-10-12 DIAGNOSIS — Z8673 Personal history of transient ischemic attack (TIA), and cerebral infarction without residual deficits: Secondary | ICD-10-CM | POA: Diagnosis not present

## 2019-10-12 DIAGNOSIS — Z7901 Long term (current) use of anticoagulants: Secondary | ICD-10-CM | POA: Diagnosis not present

## 2019-10-12 DIAGNOSIS — I350 Nonrheumatic aortic (valve) stenosis: Secondary | ICD-10-CM | POA: Insufficient documentation

## 2019-10-12 DIAGNOSIS — Z01818 Encounter for other preprocedural examination: Secondary | ICD-10-CM | POA: Insufficient documentation

## 2019-10-12 DIAGNOSIS — Z20822 Contact with and (suspected) exposure to covid-19: Secondary | ICD-10-CM | POA: Diagnosis present

## 2019-10-12 DIAGNOSIS — I501 Left ventricular failure: Secondary | ICD-10-CM | POA: Diagnosis not present

## 2019-10-12 DIAGNOSIS — Z79890 Hormone replacement therapy (postmenopausal): Secondary | ICD-10-CM | POA: Diagnosis not present

## 2019-10-12 DIAGNOSIS — L929 Granulomatous disorder of the skin and subcutaneous tissue, unspecified: Secondary | ICD-10-CM | POA: Diagnosis not present

## 2019-10-12 HISTORY — DX: Cardiac arrhythmia, unspecified: I49.9

## 2019-10-12 LAB — TYPE AND SCREEN
ABO/RH(D): O POS
Antibody Screen: NEGATIVE

## 2019-10-12 LAB — COMPREHENSIVE METABOLIC PANEL
ALT: 17 U/L (ref 0–44)
AST: 23 U/L (ref 15–41)
Albumin: 4.1 g/dL (ref 3.5–5.0)
Alkaline Phosphatase: 75 U/L (ref 38–126)
Anion gap: 14 (ref 5–15)
BUN: 55 mg/dL — ABNORMAL HIGH (ref 8–23)
CO2: 22 mmol/L (ref 22–32)
Calcium: 9.5 mg/dL (ref 8.9–10.3)
Chloride: 103 mmol/L (ref 98–111)
Creatinine, Ser: 2.32 mg/dL — ABNORMAL HIGH (ref 0.61–1.24)
GFR calc Af Amer: 29 mL/min — ABNORMAL LOW (ref >60)
GFR calc non Af Amer: 25 mL/min — ABNORMAL LOW (ref >60)
Glucose, Bld: 97 mg/dL (ref 70–99)
Potassium: 3.5 mmol/L (ref 3.5–5.1)
Sodium: 139 mmol/L (ref 135–145)
Total Bilirubin: 0.8 mg/dL (ref 0.3–1.2)
Total Protein: 7.5 g/dL (ref 6.5–8.1)

## 2019-10-12 LAB — CBC
HCT: 43.5 % (ref 39.0–52.0)
Hemoglobin: 14 g/dL (ref 13.0–17.0)
MCH: 30 pg (ref 26.0–34.0)
MCHC: 32.2 g/dL (ref 30.0–36.0)
MCV: 93.3 fL (ref 80.0–100.0)
Platelets: DECREASED 10*3/uL (ref 150–400)
RBC: 4.66 MIL/uL (ref 4.22–5.81)
RDW: 13.2 % (ref 11.5–15.5)
WBC: 7 10*3/uL (ref 4.0–10.5)
nRBC: 0 % (ref 0.0–0.2)

## 2019-10-12 LAB — URINALYSIS, ROUTINE W REFLEX MICROSCOPIC
Bilirubin Urine: NEGATIVE
Glucose, UA: NEGATIVE mg/dL
Hgb urine dipstick: NEGATIVE
Ketones, ur: NEGATIVE mg/dL
Leukocytes,Ua: NEGATIVE
Nitrite: NEGATIVE
Protein, ur: NEGATIVE mg/dL
Specific Gravity, Urine: 1.009 (ref 1.005–1.030)
pH: 5 (ref 5.0–8.0)

## 2019-10-12 LAB — BLOOD GAS, ARTERIAL
Acid-Base Excess: 1.8 mmol/L (ref 0.0–2.0)
Bicarbonate: 25.3 mmol/L (ref 20.0–28.0)
Drawn by: 58793
FIO2: 21
O2 Saturation: 98.3 %
Patient temperature: 37
pCO2 arterial: 35.7 mmHg (ref 32.0–48.0)
pH, Arterial: 7.465 — ABNORMAL HIGH (ref 7.350–7.450)
pO2, Arterial: 109 mmHg — ABNORMAL HIGH (ref 83.0–108.0)

## 2019-10-12 LAB — APTT: aPTT: 91 seconds — ABNORMAL HIGH (ref 24–36)

## 2019-10-12 LAB — SURGICAL PCR SCREEN
MRSA, PCR: NEGATIVE
Staphylococcus aureus: NEGATIVE

## 2019-10-12 LAB — PROTIME-INR
INR: 1.2 (ref 0.8–1.2)
Prothrombin Time: 14.3 seconds (ref 11.4–15.2)

## 2019-10-12 LAB — SARS CORONAVIRUS 2 (TAT 6-24 HRS): SARS Coronavirus 2: NEGATIVE

## 2019-10-12 NOTE — Progress Notes (Signed)
PCP - [name] @ [facility] Cardiologist -  Buford Dresser  PPM/ICD - na   Chest x-ray - 10/12/19 EKG - 10/12/19 Stress Test - 12/11 ECHO - 10/06/19 Cardiac Cath - 2/21  Sleep Study - 3/30 CPAP - no  Fasting Blood Sugar - na   Blood Thinner Instructions: eliquis stopped 10/08/19 Aspirin Instructions:na  ERAS Protcol -na   COVID TEST- 10/12/19   Anesthesia review: cardiac hx.  Patient denies shortness of breath, fever, cough and chest pain at PAT appointment   All instructions explained to the patient, with a verbal understanding of the material. Patient agrees to go over the instructions while at home for a better understanding. Patient also instructed to self quarantine after being tested for COVID-19. The opportunity to ask questions was provided.

## 2019-10-12 NOTE — Telephone Encounter (Signed)
LMTCB x 1 

## 2019-10-13 ENCOUNTER — Encounter (HOSPITAL_COMMUNITY): Payer: Self-pay | Admitting: Pulmonary Disease

## 2019-10-13 ENCOUNTER — Encounter (HOSPITAL_COMMUNITY): Payer: Self-pay | Admitting: Anesthesiology

## 2019-10-13 NOTE — Anesthesia Preprocedure Evaluation (Addendum)
Anesthesia Evaluation  Patient identified by MRN, date of birth, ID band Patient awake    Reviewed: Allergy & Precautions, NPO status , Patient's Chart, lab work & pertinent test results  Airway Mallampati: II  TM Distance: >3 FB Neck ROM: Full    Dental  (+) Teeth Intact, Dental Advisory Given   Pulmonary former smoker,    breath sounds clear to auscultation       Cardiovascular hypertension,  Rhythm:Regular Rate:Normal     Neuro/Psych    GI/Hepatic   Endo/Other    Renal/GU      Musculoskeletal   Abdominal   Peds  Hematology   Anesthesia Other Findings   Reproductive/Obstetrics                            Anesthesia Physical Anesthesia Plan  ASA: III  Anesthesia Plan: General   Post-op Pain Management:    Induction: Intravenous  PONV Risk Score and Plan: Ondansetron and Dexamethasone  Airway Management Planned: Double Lumen EBT  Additional Equipment:   Intra-op Plan:   Post-operative Plan: Extubation in OR  Informed Consent: I have reviewed the patients History and Physical, chart, labs and discussed the procedure including the risks, benefits and alternatives for the proposed anesthesia with the patient or authorized representative who has indicated his/her understanding and acceptance.     Dental advisory given  Plan Discussed with: CRNA and Anesthesiologist  Anesthesia Plan Comments: (PAT note written 10/13/2019 by Myra Gianotti, PA-C. )       Anesthesia Quick Evaluation

## 2019-10-13 NOTE — Progress Notes (Signed)
Anesthesia Chart Review:  Case: [id number] Date/Time: 10/14/19 0830   Procedure: XI ROBOTIC ASSISTED THORASCOPY-WEDGE RESECTION (Right Chest)   Anesthesia type: General   Pre-op diagnosis: RUL PULMONARY NODULE   Location: [place] [facility] / [facility] OR   Surgeons: Grace Isaac, MD    He is also scheduled for ENB by June Leap, DO.   DISCUSSION: Patient is an 86 year old male scheduled for the above procedure. He is s/p TAVR on 05/26/19. Preoperative imaging showed a RUL lung nodule that increased in size on follow-up imaging.   Other history includes former smoker (quit 1957), CAD and MVP/MV disease (s/p CABG/MV repair: SVG-OM, quadrangular resection of the posterior leaflet with artificial Gore-Tex neocords placement and ring annuloplasty using a 28 mm Seguin semirigid ring 09/16/02; mild-moderate MR 05/20/19 TEE), severe AS (s/p TAVR, 29 mm Edwards Sapien 05/26/19), chronic afib, chronic combined CHF, CKD (stage III-IV), dyslipidemia, HTN, TIA (> 10 years ago), carotid artery stenosis (mild 1-39% 05/13/19), OSA (intermittent BiPAP use), thrombocytopenia.   Patient with known depressed LV function. Dr. Servando Snare ordered a preoperative echo which showed EF slightly improved from < 20% up to 25-30%, trivial perivalvular leas of the AV prothesis, mild MR. Pre-TAVR cath showed mild non-obstructive disease LAD, LCX, and RCA and occluded OM1 With patent SVG-OM.   According to PCP Dr. Kenton Kingfisher notes (03/19/19, scanned under Media tab), patient's thrombocytopenia is being followed at the [facility]. PLT clumped on CBC, previously 53-88K. PT/INR 14.3/1.2, PTT 91.  17.2, INR 1.4--Last Eliquis 10/08/19. Message sent to [place] regarding PTT and PLT count. Will place order for STAT PTT and PLT count of the day of surgery.   Creatinine 2.32 (previousy 1.78-2.66 since 12/2018), K 3.5.   Presurgical COVID-19 test negative on 10/12/19. Anesthesia team to evaluate and follow-up repeat labs on the day of surgery.   VS:  BP 127/74   Pulse 65   Temp 36.7 C (Oral)   Resp 20   Ht 5\' 8"  (1.727 m)   Wt 75.6 kg   SpO2 (!) 2%   BMI 25.33 kg/m    PROVIDERS: [name], MD is PCP. He is also followed at the [facility]. Buford Dresser, MD is primary cardiologist. Virtual visit 09/25/19. Sherren Mocha, MD and Darylene Price are TAVR providers. Last visit 06/25/19 with Angelena Form, PA-C. Brand Males, MD is pulmonologist.    LABS: Preoperative labs noted. See DISCUSSION.  (all labs ordered are listed, but only abnormal results are displayed)  Labs Reviewed  APTT - Abnormal; Notable for the following components:      Result Value   aPTT 91 (*)    All other components within normal limits  BLOOD GAS, ARTERIAL - Abnormal; Notable for the following components:   pH, Arterial 7.465 (*)    pO2, Arterial 109 (*)    Allens test (pass/fail) BRACHIAL ARTERY (*)    All other components within normal limits  COMPREHENSIVE METABOLIC PANEL - Abnormal; Notable for the following components:   BUN 55 (*)    Creatinine, Ser 2.32 (*)    GFR calc non Af Amer 25 (*)    GFR calc Af Amer 29 (*)    All other components within normal limits  URINALYSIS, ROUTINE W REFLEX MICROSCOPIC - Abnormal; Notable for the following components:   Color, Urine STRAW (*)    All other components within normal limits  SURGICAL PCR SCREEN  CBC  PROTIME-INR  TYPE AND SCREEN    PFTs 04/21/19: FVC 2.98 (80%).  FEV1 2.43 (95%). DLCO unc 15.32 (66%).   IMAGES: CXR 10/12/19: IMPRESSION: Right upper lobe nodule faintly visualized. No acute abnormality noted.  CT Super D Chest 10/09/19: IMPRESSION: 1. Stable 9 x 5 mm pulmonary nodule in the peripheral RIGHT upper lobe. Unchanged from previous, recent study enlarged from study of February of 2021. Given stability short interval follow-up in 3 months could be considered. PET or biopsy given growth since February could also be reasonable as clinically  warranted. Findings remain concerning for but not diagnostic of bronchogenic neoplasm. 2. Status post CABG in trans aortic valve replacement. 3. Aortic atherosclerosis.   EKG: 10/12/19: Ventricular rate of 67 bpm Atrial fibrillation with premature ventricular or aberrantly conducted complexes Non-specific intra-ventricular conduction block Abnormal ECG No significant change since last tracing Confirmed by Kirk Ruths [phone]) on 10/12/2019 3:31:05 PM   CV: Echo 10/06/19: IMPRESSIONS  1. Left ventricular ejection fraction, by estimation, is 25 to 30%. The  left ventricle has severely decreased function. The left ventricle  demonstrates global hypokinesis. There is mild left ventricular  hypertrophy. Left ventricular diastolic parameters  are indeterminate.  2. Right ventricular systolic function is mildly reduced. The right  ventricular size is normal. There is normal pulmonary artery systolic  pressure. The estimated right ventricular systolic pressure is 78.2 mmHg.  3. Left atrial size was moderately dilated.  4. The mitral valve has been repaired/replaced. Mild mitral valve  regurgitation. No evidence of mitral stenosis. The mean mitral valve  gradient is 2.0 mmHg with average heart rate of 60 bpm. There is a 28 mm  prosthetic annuloplasty ring present in the  mitral position.  5. The aortic valve has been repaired/replaced. Aortic valve  regurgitation is trivial. There is a 29 mm Edwards Edwards Sapien  prosthetic (TAVR) valve present in the aortic position. Echo findings are  consistent with a trivial perivalvular leak of the  aortic prosthesis. Aortic valve peak and mean gradients of 4 and 7 mmHg,  respectively. AVA 2.75 cm2.  6. Aortic dilatation noted. There is borderline dilatation at the level  of the sinuses of Valsalva measuring 38 mm. The ascending aorta measures  normal in size.  7. The inferior vena cava is normal in size with greater than 50%   respiratory variability, suggesting right atrial pressure of 3 mmHg.  - Comparison(s): Changes from prior study are noted. 06/25/19: LVEF <20%, TAVR  with P/M gradients of 4 and 6 mmHg, trivial perivalvular leak.  (Comparison: LVEF < 20% 06/25/19, 20-25% 05/20/19)   Carotid US 05/13/19: Summary:  - Right Carotid: Velocities in the right ICA are consistent with a 1-39% stenosis.  - Left Carotid: Velocities in the left ICA are consistent with a 1-39% stenosis.  - Vertebrals: Bilateral vertebral arteries demonstrate antegrade flow.  - Subclavians: Normal flow hemodynamics were seen in bilateral subclavian arteries.    Cardiac cath (pre-TAVR) 04/28/19: 1.  Nonobstructive coronary disease with mild diffuse stenosis of the LAD, left circumflex, and RCA 2.  Total occlusion of the first OM branch of the circumflex with continued patency of the saphenous vein graft to OM 3.  Severe aortic stenosis with mean gradient 33 mmHg and calculated valve area 0.8 cm 4.  Large V waves consistent with severe mitral regurgitation - Recommendation: Continue multidisciplinary evaluation for this patient with severe low-flow low gradient aortic stenosis, probable severe mitral regurgitation with previous mitral repair, and progressive heart failure symptoms   Past Medical History:  Diagnosis Date  . Adenomatous colon polyp   .  CAD (coronary artery disease)    a. S/P CABG x 1 @ time of MV annuloplasty;  b. 02/2010 ETT: neg for ischemia.  . Carotid artery stenosis    1-39% by dopplers 10/2016  . Chronic combined systolic (congestive) and diastolic (congestive) heart failure ([place])   . CKD (chronic kidney disease), stage III   . Current use of long term anticoagulation    a. Coumadin in setting of afib.  . Diverticulosis   . Dyslipidemia   . Dysrhythmia    A-fib  . Essential hypertension   . H/O mitral valve repair 09/16/2002   Complex valvuloplasty including quadrangular resection of posterior leaflet with  artificial Gore-tex neochords and 28 mm Seguin ring annuloplasty - Dr Servando Snare  . Hemorrhoids   . Hyperlipidemia   . Hypertension   . Permanent atrial fibrillation (HCC)    a. CHA2DS2VASc = 6 -->chronic coumadin;  b. Prev on amio-->d/c 2/2 hypothyroidism. >> resumed 5/16. c. Notes from 2017 indicate interstitial lung disease by pulm appt, question amio toxicity, also increased liver attenuation. Rate control pursued and amiodarone discontinued.  . S/P CABG x 1 09/16/2002   SVG to OM  . 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 Sleep apnea 12/2018   per patient's spouse wears BIPAP "every other night or so"  . Thrombocytopenia ([place])   . TIA (transient ischemic attack)     Past Surgical History:  Procedure Laterality Date  . CATARACT EXTRACTION W/ INTRAOCULAR LENS  IMPLANT, BILATERAL     per patient about 4 years ago  . CORONARY ARTERY BYPASS GRAFT  2004  . INTRAOPERATIVE TRANSTHORACIC ECHOCARDIOGRAM N/A 05/26/2019   Procedure: INTRAOPERATIVE TRANSTHORACIC ECHOCARDIOGRAM;  Surgeon: Sherren Mocha, MD;  Location: [facility];  Service: Open Heart Surgery;  Laterality: N/A;  . MITRAL VALVE REPAIR    . RIGHT/LEFT HEART CATH AND CORONARY/GRAFT ANGIOGRAPHY N/A 04/28/2019   Procedure: RIGHT/LEFT HEART CATH AND CORONARY/GRAFT ANGIOGRAPHY;  Surgeon: Sherren Mocha, MD;  Location: [facility] CV LAB;  Service: Cardiovascular;  Laterality: N/A;  . TEE WITHOUT CARDIOVERSION N/A 05/20/2019   Procedure: TRANSESOPHAGEAL ECHOCARDIOGRAM (TEE);  Surgeon: Donato Heinz, MD;  Location: [facility] ENDOSCOPY;  Service: Cardiovascular;  Laterality: N/A;  . TRANSCATHETER AORTIC VALVE REPLACEMENT, TRANSFEMORAL N/A 05/26/2019   Procedure: TRANSCATHETER AORTIC VALVE REPLACEMENT, TRANSFEMORAL;  Surgeon: Sherren Mocha, MD;  Location: [place];  Service: Open Heart Surgery;  Laterality: N/A;    MEDICATIONS: . apixaban  (ELIQUIS) 2.5 MG TABS tablet  . apixaban (ELIQUIS) 5 MG TABS tablet  . Cholecalciferol (VITAMIN D-3) 1000 units CAPS  . docusate sodium (COLACE) 100 MG capsule  . feeding supplement, ENSURE ENLIVE, (ENSURE ENLIVE) LIQD  . levothyroxine (SYNTHROID) 75 MCG tablet  . metoprolol succinate (TOPROL-XL) 50 MG 24 hr tablet  . mirtazapine (REMERON) 15 MG tablet  . Polyethyl Glycol-Propyl Glycol (SYSTANE OP)  . potassium chloride SA (KLOR-CON) 20 MEQ tablet  . pravastatin (PRAVACHOL) 80 MG tablet  . torsemide (DEMADEX) 20 MG tablet  . vitamin B-12 (CYANOCOBALAMIN) 1000 MCG tablet   No current facility-administered medications for this encounter.    Myra Gianotti, PA-C Surgical Short Stay/Anesthesiology [facility] Phone [phone] [facility] Phone [phone] 10/13/2019 11:29 AM

## 2019-10-14 ENCOUNTER — Ambulatory Visit (HOSPITAL_COMMUNITY): Payer: PPO

## 2019-10-14 ENCOUNTER — Inpatient Hospital Stay (HOSPITAL_COMMUNITY)
Admission: RE | Admit: 2019-10-14 | Discharge: 2019-10-17 | DRG: 167 | Disposition: A | Discharge status: Home / Self Care | Payer: PPO | Attending: Cardiothoracic Surgery | Admitting: Cardiothoracic Surgery

## 2019-10-14 ENCOUNTER — Encounter (HOSPITAL_COMMUNITY)
Admission: RE | Disposition: A | Discharge status: Home / Self Care | Payer: Self-pay | Source: Home / Self Care | Attending: Cardiothoracic Surgery

## 2019-10-14 ENCOUNTER — Inpatient Hospital Stay (HOSPITAL_COMMUNITY): Payer: PPO

## 2019-10-14 ENCOUNTER — Encounter (HOSPITAL_COMMUNITY): Payer: Self-pay | Admitting: Pulmonary Disease

## 2019-10-14 ENCOUNTER — Ambulatory Visit (HOSPITAL_COMMUNITY): Payer: PPO | Admitting: Vascular Surgery

## 2019-10-14 ENCOUNTER — Other Ambulatory Visit: Payer: Self-pay

## 2019-10-14 ENCOUNTER — Ambulatory Visit: Payer: PPO | Admitting: Emergency Medicine

## 2019-10-14 DIAGNOSIS — Z8673 Personal history of transient ischemic attack (TIA), and cerebral infarction without residual deficits: Secondary | ICD-10-CM | POA: Diagnosis not present

## 2019-10-14 DIAGNOSIS — Z86010 Personal history of colonic polyps: Secondary | ICD-10-CM

## 2019-10-14 DIAGNOSIS — E785 Hyperlipidemia, unspecified: Secondary | ICD-10-CM | POA: Diagnosis present

## 2019-10-14 DIAGNOSIS — Z9689 Presence of other specified functional implants: Secondary | ICD-10-CM

## 2019-10-14 DIAGNOSIS — Z953 Presence of xenogenic heart valve: Secondary | ICD-10-CM | POA: Diagnosis not present

## 2019-10-14 DIAGNOSIS — E039 Hypothyroidism, unspecified: Secondary | ICD-10-CM | POA: Diagnosis present

## 2019-10-14 DIAGNOSIS — Z79890 Hormone replacement therapy (postmenopausal): Secondary | ICD-10-CM

## 2019-10-14 DIAGNOSIS — Z09 Encounter for follow-up examination after completed treatment for conditions other than malignant neoplasm: Secondary | ICD-10-CM

## 2019-10-14 DIAGNOSIS — I2510 Atherosclerotic heart disease of native coronary artery without angina pectoris: Secondary | ICD-10-CM | POA: Diagnosis present

## 2019-10-14 DIAGNOSIS — I130 Hypertensive heart and chronic kidney disease with heart failure and stage 1 through stage 4 chronic kidney disease, or unspecified chronic kidney disease: Secondary | ICD-10-CM | POA: Diagnosis present

## 2019-10-14 DIAGNOSIS — I5022 Chronic systolic (congestive) heart failure: Secondary | ICD-10-CM | POA: Diagnosis not present

## 2019-10-14 DIAGNOSIS — Z4682 Encounter for fitting and adjustment of non-vascular catheter: Secondary | ICD-10-CM

## 2019-10-14 DIAGNOSIS — Z87891 Personal history of nicotine dependence: Secondary | ICD-10-CM

## 2019-10-14 DIAGNOSIS — I4821 Permanent atrial fibrillation: Secondary | ICD-10-CM | POA: Diagnosis present

## 2019-10-14 DIAGNOSIS — Z9889 Other specified postprocedural states: Secondary | ICD-10-CM

## 2019-10-14 DIAGNOSIS — Z823 Family history of stroke: Secondary | ICD-10-CM

## 2019-10-14 DIAGNOSIS — Z79899 Other long term (current) drug therapy: Secondary | ICD-10-CM

## 2019-10-14 DIAGNOSIS — I501 Left ventricular failure: Secondary | ICD-10-CM | POA: Diagnosis not present

## 2019-10-14 DIAGNOSIS — I5030 Unspecified diastolic (congestive) heart failure: Secondary | ICD-10-CM | POA: Diagnosis not present

## 2019-10-14 DIAGNOSIS — Z7901 Long term (current) use of anticoagulants: Secondary | ICD-10-CM | POA: Diagnosis not present

## 2019-10-14 DIAGNOSIS — I5042 Chronic combined systolic (congestive) and diastolic (congestive) heart failure: Secondary | ICD-10-CM | POA: Diagnosis present

## 2019-10-14 DIAGNOSIS — R911 Solitary pulmonary nodule: Principal | ICD-10-CM | POA: Diagnosis present

## 2019-10-14 DIAGNOSIS — Z8249 Family history of ischemic heart disease and other diseases of the circulatory system: Secondary | ICD-10-CM

## 2019-10-14 DIAGNOSIS — Z6825 Body mass index (BMI) 25.0-25.9, adult: Secondary | ICD-10-CM | POA: Diagnosis not present

## 2019-10-14 DIAGNOSIS — N1832 Chronic kidney disease, stage 3b: Secondary | ICD-10-CM | POA: Diagnosis present

## 2019-10-14 DIAGNOSIS — I480 Paroxysmal atrial fibrillation: Secondary | ICD-10-CM | POA: Diagnosis not present

## 2019-10-14 DIAGNOSIS — N183 Chronic kidney disease, stage 3 unspecified: Secondary | ICD-10-CM | POA: Diagnosis not present

## 2019-10-14 DIAGNOSIS — I10 Essential (primary) hypertension: Secondary | ICD-10-CM | POA: Diagnosis not present

## 2019-10-14 DIAGNOSIS — G4700 Insomnia, unspecified: Secondary | ICD-10-CM | POA: Diagnosis not present

## 2019-10-14 DIAGNOSIS — J849 Interstitial pulmonary disease, unspecified: Secondary | ICD-10-CM | POA: Diagnosis present

## 2019-10-14 DIAGNOSIS — E7800 Pure hypercholesterolemia, unspecified: Secondary | ICD-10-CM | POA: Diagnosis not present

## 2019-10-14 DIAGNOSIS — Z20822 Contact with and (suspected) exposure to covid-19: Secondary | ICD-10-CM | POA: Diagnosis present

## 2019-10-14 DIAGNOSIS — E440 Moderate protein-calorie malnutrition: Secondary | ICD-10-CM | POA: Diagnosis present

## 2019-10-14 HISTORY — PX: ELECTROMAGNETIC NAVIGATION BROCHOSCOPY: SHX5369

## 2019-10-14 HISTORY — PX: INTERCOSTAL NERVE BLOCK: SHX5021

## 2019-10-14 HISTORY — PX: SUBMUCOSAL TATTOO INJECTION: SHX6856

## 2019-10-14 HISTORY — PX: VIDEO BRONCHOSCOPY WITH ENDOBRONCHIAL NAVIGATION: SHX6175

## 2019-10-14 LAB — PLATELET COUNT: Platelets: 91 10*3/uL — ABNORMAL LOW (ref 150–400)

## 2019-10-14 LAB — APTT: aPTT: 33 seconds (ref 24–36)

## 2019-10-14 LAB — GLUCOSE, CAPILLARY: Glucose-Capillary: 143 mg/dL — ABNORMAL HIGH (ref 70–99)

## 2019-10-14 SURGERY — VIDEO BRONCHOSCOPY WITH ENDOBRONCHIAL NAVIGATION
Anesthesia: General

## 2019-10-14 SURGERY — WEDGE RESECTION, LUNG, ROBOT-ASSISTED, THORACOSCOPIC
Anesthesia: General | Site: Chest | Laterality: Right

## 2019-10-14 MED ORDER — ACETAMINOPHEN 500 MG PO TABS
1000.0000 mg | ORAL_TABLET | Freq: Four times a day (QID) | ORAL | Status: DC
  Administered 2019-10-14 – 2019-10-16 (×9): 1000 mg via ORAL
  Filled 2019-10-14 (×9): qty 2

## 2019-10-14 MED ORDER — BUPIVACAINE LIPOSOME 1.3 % IJ SUSP
20.0000 mL | Freq: Once | INTRAMUSCULAR | Status: DC
  Filled 2019-10-14: qty 20

## 2019-10-14 MED ORDER — PROPOFOL 10 MG/ML IV BOLUS
INTRAVENOUS | Status: AC
  Filled 2019-10-14: qty 40

## 2019-10-14 MED ORDER — MIDAZOLAM HCL 2 MG/2ML IJ SOLN
INTRAMUSCULAR | Status: AC
  Filled 2019-10-14: qty 2

## 2019-10-14 MED ORDER — ONDANSETRON HCL 4 MG/2ML IJ SOLN: 4.0000 mg | Freq: Once | INTRAMUSCULAR | Status: DC | PRN

## 2019-10-14 MED ORDER — DEXAMETHASONE SODIUM PHOSPHATE 10 MG/ML IJ SOLN
INTRAMUSCULAR | Status: DC | PRN
  Administered 2019-10-14: 8 mg via INTRAVENOUS

## 2019-10-14 MED ORDER — FENTANYL CITRATE (PF) 250 MCG/5ML IJ SOLN
INTRAMUSCULAR | Status: AC
  Filled 2019-10-14: qty 5

## 2019-10-14 MED ORDER — OXYCODONE HCL 5 MG/5ML PO SOLN: 5.0000 mg | Freq: Once | ORAL | Status: DC | PRN

## 2019-10-14 MED ORDER — ACETAMINOPHEN 160 MG/5ML PO SOLN: 1000.0000 mg | Freq: Four times a day (QID) | ORAL | Status: DC

## 2019-10-14 MED ORDER — PHENYLEPHRINE HCL (PRESSORS) 10 MG/ML IV SOLN
INTRAVENOUS | Status: AC
  Filled 2019-10-14: qty 1

## 2019-10-14 MED ORDER — GLYCOPYRROLATE PF 0.2 MG/ML IJ SOSY
PREFILLED_SYRINGE | INTRAMUSCULAR | Status: AC
  Filled 2019-10-14: qty 1

## 2019-10-14 MED ORDER — ORAL CARE MOUTH RINSE: 15.0000 mL | Freq: Once | OROMUCOSAL | Status: AC

## 2019-10-14 MED ORDER — DEXAMETHASONE SODIUM PHOSPHATE 10 MG/ML IJ SOLN
INTRAMUSCULAR | Status: AC
  Filled 2019-10-14: qty 1

## 2019-10-14 MED ORDER — ENOXAPARIN SODIUM 30 MG/0.3ML ~~LOC~~ SOLN
30.0000 mg | Freq: Every day | SUBCUTANEOUS | Status: DC
  Administered 2019-10-15: 30 mg via SUBCUTANEOUS
  Filled 2019-10-14: qty 0.3

## 2019-10-14 MED ORDER — PRAVASTATIN SODIUM 40 MG PO TABS
80.0000 mg | ORAL_TABLET | ORAL | Status: DC
  Administered 2019-10-16: 80 mg via ORAL
  Filled 2019-10-14 (×2): qty 2

## 2019-10-14 MED ORDER — SODIUM CHLORIDE 0.9% FLUSH: 10.0000 mL | INTRAVENOUS | Status: DC | PRN

## 2019-10-14 MED ORDER — CHLORHEXIDINE GLUCONATE CLOTH 2 % EX PADS
6.0000 | MEDICATED_PAD | Freq: Every day | CUTANEOUS | Status: DC
  Administered 2019-10-15 – 2019-10-17 (×3): 6 via TOPICAL

## 2019-10-14 MED ORDER — CEFAZOLIN SODIUM-DEXTROSE 2-4 GM/100ML-% IV SOLN
2.0000 g | INTRAVENOUS | Status: AC
  Administered 2019-10-14: 2 g via INTRAVENOUS

## 2019-10-14 MED ORDER — FENTANYL CITRATE (PF) 100 MCG/2ML IJ SOLN: 25.0000 ug | INTRAMUSCULAR | Status: DC | PRN

## 2019-10-14 MED ORDER — CEFAZOLIN SODIUM-DEXTROSE 2-4 GM/100ML-% IV SOLN
2.0000 g | Freq: Three times a day (TID) | INTRAVENOUS | Status: AC
  Administered 2019-10-14 – 2019-10-15 (×2): 2 g via INTRAVENOUS
  Filled 2019-10-14 (×2): qty 100

## 2019-10-14 MED ORDER — LACTATED RINGERS IV SOLN: INTRAVENOUS | Status: DC

## 2019-10-14 MED ORDER — SODIUM CHLORIDE 0.9% FLUSH
10.0000 mL | Freq: Two times a day (BID) | INTRAVENOUS | Status: DC
  Administered 2019-10-14 – 2019-10-17 (×5): 10 mL

## 2019-10-14 MED ORDER — SUGAMMADEX SODIUM 200 MG/2ML IV SOLN
INTRAVENOUS | Status: DC | PRN
  Administered 2019-10-14: 160 mg via INTRAVENOUS

## 2019-10-14 MED ORDER — MIDAZOLAM HCL 2 MG/2ML IJ SOLN
INTRAMUSCULAR | Status: DC | PRN
  Administered 2019-10-14: 1 mg via INTRAVENOUS

## 2019-10-14 MED ORDER — METHYLENE BLUE 0.5 % INJ SOLN
INTRAVENOUS | Status: DC | PRN
  Administered 2019-10-14: 1.5 mL

## 2019-10-14 MED ORDER — BISACODYL 5 MG PO TBEC
10.0000 mg | DELAYED_RELEASE_TABLET | Freq: Every day | ORAL | Status: DC
  Administered 2019-10-14 – 2019-10-17 (×4): 10 mg via ORAL
  Filled 2019-10-14 (×4): qty 2

## 2019-10-14 MED ORDER — ROCURONIUM BROMIDE 10 MG/ML (PF) SYRINGE
PREFILLED_SYRINGE | INTRAVENOUS | Status: DC | PRN
  Administered 2019-10-14: 40 mg via INTRAVENOUS
  Administered 2019-10-14: 60 mg via INTRAVENOUS

## 2019-10-14 MED ORDER — 0.9 % SODIUM CHLORIDE (POUR BTL) OPTIME
TOPICAL | Status: DC | PRN
  Administered 2019-10-14: 1000 mL

## 2019-10-14 MED ORDER — SENNOSIDES-DOCUSATE SODIUM 8.6-50 MG PO TABS
1.0000 | ORAL_TABLET | Freq: Every day | ORAL | Status: DC
  Administered 2019-10-14: 1 via ORAL
  Filled 2019-10-14: qty 1

## 2019-10-14 MED ORDER — TRAMADOL HCL 50 MG PO TABS
50.0000 mg | ORAL_TABLET | Freq: Four times a day (QID) | ORAL | Status: DC | PRN
  Administered 2019-10-15: 50 mg via ORAL
  Filled 2019-10-14: qty 1

## 2019-10-14 MED ORDER — PHENYLEPHRINE HCL-NACL 10-0.9 MG/250ML-% IV SOLN
INTRAVENOUS | Status: DC | PRN
Start: 2019-10-14 — End: 2019-10-14
  Administered 2019-10-14: 25 ug/min via INTRAVENOUS

## 2019-10-14 MED ORDER — INSULIN ASPART 100 UNIT/ML ~~LOC~~ SOLN
0.0000 [IU] | Freq: Three times a day (TID) | SUBCUTANEOUS | Status: DC
  Administered 2019-10-14 – 2019-10-15 (×2): 2 [IU] via SUBCUTANEOUS
  Administered 2019-10-15: 8 [IU] via SUBCUTANEOUS
  Administered 2019-10-16 – 2019-10-17 (×3): 2 [IU] via SUBCUTANEOUS

## 2019-10-14 MED ORDER — SODIUM CHLORIDE (PF) 0.9 % IJ SOLN
INTRAMUSCULAR | Status: AC
  Filled 2019-10-14: qty 10

## 2019-10-14 MED ORDER — LACTATED RINGERS IV SOLN
INTRAVENOUS | Status: DC | PRN
Start: 2019-10-14 — End: 2019-10-14

## 2019-10-14 MED ORDER — GLYCOPYRROLATE 0.2 MG/ML IJ SOLN
INTRAMUSCULAR | Status: DC | PRN
  Administered 2019-10-14: .2 mg via INTRAVENOUS

## 2019-10-14 MED ORDER — ONDANSETRON HCL 4 MG/2ML IJ SOLN: 4.0000 mg | Freq: Four times a day (QID) | INTRAMUSCULAR | Status: DC | PRN

## 2019-10-14 MED ORDER — SODIUM CHLORIDE 0.9 % IV SOLN: INTRAVENOUS | Status: DC | PRN

## 2019-10-14 MED ORDER — ONDANSETRON HCL 4 MG/2ML IJ SOLN
INTRAMUSCULAR | Status: DC | PRN
  Administered 2019-10-14: 4 mg via INTRAVENOUS

## 2019-10-14 MED ORDER — PHENYLEPHRINE 40 MCG/ML (10ML) SYRINGE FOR IV PUSH (FOR BLOOD PRESSURE SUPPORT)
PREFILLED_SYRINGE | INTRAVENOUS | Status: AC
  Filled 2019-10-14: qty 10

## 2019-10-14 MED ORDER — EPHEDRINE 5 MG/ML INJ
INTRAVENOUS | Status: AC
  Filled 2019-10-14: qty 10

## 2019-10-14 MED ORDER — PROPOFOL 10 MG/ML IV BOLUS
INTRAVENOUS | Status: DC | PRN
  Administered 2019-10-14 (×2): 50 mg via INTRAVENOUS
  Administered 2019-10-14: 120 mg via INTRAVENOUS
  Administered 2019-10-14: 60 mg via INTRAVENOUS

## 2019-10-14 MED ORDER — LEVOTHYROXINE SODIUM 75 MCG PO TABS
75.0000 ug | ORAL_TABLET | Freq: Every day | ORAL | Status: DC
  Administered 2019-10-15 – 2019-10-17 (×3): 75 ug via ORAL
  Filled 2019-10-14 (×3): qty 1

## 2019-10-14 MED ORDER — CHLORHEXIDINE GLUCONATE 0.12 % MT SOLN
15.0000 mL | Freq: Once | OROMUCOSAL | Status: AC
  Administered 2019-10-14: 15 mL via OROMUCOSAL
  Filled 2019-10-14: qty 15

## 2019-10-14 MED ORDER — OXYCODONE HCL 5 MG PO TABS: 5.0000 mg | ORAL_TABLET | Freq: Once | ORAL | Status: DC | PRN

## 2019-10-14 MED ORDER — SODIUM CHLORIDE FLUSH 0.9 % IV SOLN
INTRAVENOUS | Status: DC | PRN
  Administered 2019-10-14: 100 mL

## 2019-10-14 MED ORDER — FENTANYL CITRATE (PF) 250 MCG/5ML IJ SOLN
INTRAMUSCULAR | Status: DC | PRN
  Administered 2019-10-14: 100 ug via INTRAVENOUS
  Administered 2019-10-14: 50 ug via INTRAVENOUS
  Administered 2019-10-14: 25 ug via INTRAVENOUS
  Administered 2019-10-14: 50 ug via INTRAVENOUS
  Administered 2019-10-14: 25 ug via INTRAVENOUS
  Administered 2019-10-14: 50 ug via INTRAVENOUS

## 2019-10-14 MED ORDER — ONDANSETRON HCL 4 MG/2ML IJ SOLN
INTRAMUSCULAR | Status: AC
  Filled 2019-10-14: qty 2

## 2019-10-14 MED ORDER — PHENYLEPHRINE 40 MCG/ML (10ML) SYRINGE FOR IV PUSH (FOR BLOOD PRESSURE SUPPORT)
PREFILLED_SYRINGE | INTRAVENOUS | Status: DC | PRN
  Administered 2019-10-14: 80 ug via INTRAVENOUS

## 2019-10-14 MED ORDER — ROCURONIUM BROMIDE 10 MG/ML (PF) SYRINGE
PREFILLED_SYRINGE | INTRAVENOUS | Status: AC
  Filled 2019-10-14: qty 10

## 2019-10-14 SURGICAL SUPPLY — 47 items
ADAPTER BRONCH F/PENTAX (ADAPTER) ×4 IMPLANT
ADAPTER VALVE BIOPSY EBUS (MISCELLANEOUS) IMPLANT
ADPR BSCP EDG PNTX (ADAPTER) ×2
ADPTR VALVE BIOPSY EBUS (MISCELLANEOUS)
BRUSH CYTOL CELLEBRITY 1.5X140 (MISCELLANEOUS) ×4 IMPLANT
BRUSH SUPERTRAX BIOPSY (INSTRUMENTS) IMPLANT
BRUSH SUPERTRAX NDL-TIP CYTO (INSTRUMENTS) ×4 IMPLANT
CANISTER SUCT 3000ML PPV (MISCELLANEOUS) ×4 IMPLANT
CHANNEL WORK EXTEND EDGE 180 (KITS) IMPLANT
CHANNEL WORK EXTEND EDGE 45 (KITS) IMPLANT
CHANNEL WORK EXTEND EDGE 90 (KITS) IMPLANT
CONT SPEC 4OZ CLIKSEAL STRL BL (MISCELLANEOUS) ×4 IMPLANT
COVER BACK TABLE 60X90IN (DRAPES) ×4 IMPLANT
FILTER STRAW FLUID ASPIR (MISCELLANEOUS) IMPLANT
FORCEPS BIOP SUPERTRX PREMAR (INSTRUMENTS) ×4 IMPLANT
GAUZE SPONGE 4X4 12PLY STRL (GAUZE/BANDAGES/DRESSINGS) ×4 IMPLANT
GLOVE SURG SS PI 7.5 STRL IVOR (GLOVE) ×8 IMPLANT
GOWN STRL REUS W/ TWL LRG LVL3 (GOWN DISPOSABLE) ×4 IMPLANT
GOWN STRL REUS W/TWL LRG LVL3 (GOWN DISPOSABLE) ×8
KIT CLEAN ENDO COMPLIANCE (KITS) ×4 IMPLANT
KIT LOCATABLE GUIDE (CANNULA) IMPLANT
KIT MARKER FIDUCIAL DELIVERY (KITS) IMPLANT
KIT PROCEDURE EDGE 180 (KITS) IMPLANT
KIT PROCEDURE EDGE 45 (KITS) IMPLANT
KIT PROCEDURE EDGE 90 (KITS) IMPLANT
KIT TURNOVER KIT B (KITS) ×4 IMPLANT
MARKER SKIN DUAL TIP RULER LAB (MISCELLANEOUS) ×4 IMPLANT
NDL SUPERTRX PREMARK BIOPSY (NEEDLE) ×2 IMPLANT
NEEDLE SUPERTRX PREMARK BIOPSY (NEEDLE) ×4 IMPLANT
NS IRRIG 1000ML POUR BTL (IV SOLUTION) ×4 IMPLANT
OIL SILICONE PENTAX (PARTS (SERVICE/REPAIRS)) ×4 IMPLANT
PAD ARMBOARD 7.5X6 YLW CONV (MISCELLANEOUS) ×8 IMPLANT
PATCHES PATIENT (LABEL) ×12 IMPLANT
SOL ANTI FOG 6CC (MISCELLANEOUS) ×2 IMPLANT
SOLUTION ANTI FOG 6CC (MISCELLANEOUS) ×2
SYR 20CC LL (SYRINGE) ×4 IMPLANT
SYR 20ML ECCENTRIC (SYRINGE) ×4 IMPLANT
SYR 50ML SLIP (SYRINGE) ×4 IMPLANT
TOWEL OR 17X24 6PK STRL BLUE (TOWEL DISPOSABLE) ×4 IMPLANT
TRAP SPECIMEN MUCOUS 40CC (MISCELLANEOUS) IMPLANT
TUBE CONNECTING 20'X1/4 (TUBING) ×1
TUBE CONNECTING 20X1/4 (TUBING) ×3 IMPLANT
UNDERPAD 30X30 (UNDERPADS AND DIAPERS) ×4 IMPLANT
VALVE BIOPSY  SINGLE USE (MISCELLANEOUS) ×4
VALVE BIOPSY SINGLE USE (MISCELLANEOUS) ×2 IMPLANT
VALVE SUCTION BRONCHIO DISP (MISCELLANEOUS) ×4 IMPLANT
WATER STERILE IRR 1000ML POUR (IV SOLUTION) ×4 IMPLANT

## 2019-10-14 SURGICAL SUPPLY — 112 items
ADH SKN CLS APL DERMABOND .7 (GAUZE/BANDAGES/DRESSINGS) ×1
ANCHOR CATH FOLEY SECURE (MISCELLANEOUS) ×2 IMPLANT
APL SRG 22X2 LUM MLBL SLNT (VASCULAR PRODUCTS)
APPLICATOR TIP EXT COSEAL (VASCULAR PRODUCTS) IMPLANT
BAG SPEC RTRVL 10 TROC 200 (ENDOMECHANICALS)
BAG SPEC RTRVL C125 8X14 (MISCELLANEOUS) ×1
BLADE CLIPPER SURG (BLADE) ×2 IMPLANT
BLADE SURG 11 STRL SS (BLADE) ×2 IMPLANT
BNDG COHESIVE 6X5 TAN STRL LF (GAUZE/BANDAGES/DRESSINGS) ×2 IMPLANT
CANISTER SUCT 3000ML PPV (MISCELLANEOUS) ×4 IMPLANT
CANNULA REDUC XI 12-8 STAPL (CANNULA) ×4
CANNULA REDUCER 12-8 DVNC XI (CANNULA) ×2 IMPLANT
CATH THORACIC 28FR (CATHETERS) IMPLANT
CATH THORACIC 36FR (CATHETERS) IMPLANT
CATH THORACIC 36FR RT ANG (CATHETERS) IMPLANT
CLIP VESOCCLUDE MED 6/CT (CLIP) ×2 IMPLANT
CNTNR URN SCR LID CUP LEK RST (MISCELLANEOUS) ×3 IMPLANT
CONN ST 1/4X3/8  BEN (MISCELLANEOUS) ×2
CONN ST 1/4X3/8 BEN (MISCELLANEOUS) IMPLANT
CONT SPEC 4OZ STRL OR WHT (MISCELLANEOUS) ×10
DEFOGGER SCOPE WARMER CLEARIFY (MISCELLANEOUS) ×2 IMPLANT
DERMABOND ADVANCED (GAUZE/BANDAGES/DRESSINGS) ×1
DERMABOND ADVANCED .7 DNX12 (GAUZE/BANDAGES/DRESSINGS) ×1 IMPLANT
DISSECTOR BLUNT TIP ENDO 5MM (MISCELLANEOUS) IMPLANT
DRAIN CHANNEL 28F RND 3/8 FF (WOUND CARE) ×1 IMPLANT
DRAPE ARM DVNC X/XI (DISPOSABLE) ×4 IMPLANT
DRAPE COLUMN DVNC XI (DISPOSABLE) ×1 IMPLANT
DRAPE DA VINCI XI ARM (DISPOSABLE) ×8
DRAPE DA VINCI XI COLUMN (DISPOSABLE) ×2
DRAPE INCISE IOBAN 66X45 STRL (DRAPES) ×1 IMPLANT
DRAPE ORTHO SPLIT 77X108 STRL (DRAPES) ×2
DRAPE SURG ORHT 6 SPLT 77X108 (DRAPES) ×1 IMPLANT
DRAPE WARM FLUID 44X44 (DRAPES) ×2 IMPLANT
ELECT BLADE 4.0 EZ CLEAN MEGAD (MISCELLANEOUS) ×2
ELECT REM PT RETURN 9FT ADLT (ELECTROSURGICAL) ×2
ELECTRODE BLDE 4.0 EZ CLN MEGD (MISCELLANEOUS) ×1 IMPLANT
ELECTRODE REM PT RTRN 9FT ADLT (ELECTROSURGICAL) ×1 IMPLANT
GAUZE KITTNER 4X8 (MISCELLANEOUS) ×4 IMPLANT
GAUZE SPONGE 4X4 12PLY STRL (GAUZE/BANDAGES/DRESSINGS) ×2 IMPLANT
GLOVE BIO SURGEON STRL SZ 6.5 (GLOVE) ×8 IMPLANT
GLOVE BIOGEL PI IND STRL 6.5 (GLOVE) IMPLANT
GLOVE BIOGEL PI IND STRL 7.0 (GLOVE) IMPLANT
GLOVE BIOGEL PI INDICATOR 6.5 (GLOVE) ×2
GLOVE BIOGEL PI INDICATOR 7.0 (GLOVE) ×2
GLOVE SURG SS PI 6.0 STRL IVOR (GLOVE) ×1 IMPLANT
GOWN STRL REUS W/ TWL LRG LVL3 (GOWN DISPOSABLE) ×3 IMPLANT
GOWN STRL REUS W/TWL 2XL LVL3 (GOWN DISPOSABLE) ×2 IMPLANT
GOWN STRL REUS W/TWL LRG LVL3 (GOWN DISPOSABLE) ×6
HEMOSTAT SURGICEL 2X14 (HEMOSTASIS) ×6 IMPLANT
IRRIGATION STRYKERFLOW (MISCELLANEOUS) ×1 IMPLANT
IRRIGATOR STRYKERFLOW (MISCELLANEOUS) ×2
KIT BASIN OR (CUSTOM PROCEDURE TRAY) ×2 IMPLANT
KIT TURNOVER KIT B (KITS) ×2 IMPLANT
NDL SPNL 18GX3.5 QUINCKE PK (NEEDLE) ×1 IMPLANT
NEEDLE SPNL 18GX3.5 QUINCKE PK (NEEDLE) ×2 IMPLANT
NS IRRIG 1000ML POUR BTL (IV SOLUTION) ×2 IMPLANT
OBTURATOR OPTICAL STANDARD 8MM (TROCAR)
OBTURATOR OPTICAL STND 8 DVNC (TROCAR)
OBTURATOR OPTICALSTD 8 DVNC (TROCAR) IMPLANT
PACK CHEST (CUSTOM PROCEDURE TRAY) ×2 IMPLANT
PAD ARMBOARD 7.5X6 YLW CONV (MISCELLANEOUS) ×10 IMPLANT
PASSER SUT SWANSON 36MM LOOP (INSTRUMENTS) IMPLANT
PENCIL SMOKE EVACUATOR (MISCELLANEOUS) ×2 IMPLANT
POUCH RETRIEVAL ECOSAC 10 (ENDOMECHANICALS) IMPLANT
POUCH RETRIEVAL ECOSAC 10MM (ENDOMECHANICALS)
RELOAD STAPLE 45 3.5 BLU DVNC (STAPLE) IMPLANT
RELOAD STAPLER 3.5X45 BLU DVNC (STAPLE) ×3 IMPLANT
SEAL CANN UNIV 5-8 DVNC XI (MISCELLANEOUS) ×1 IMPLANT
SEAL XI 5MM-8MM UNIVERSAL (MISCELLANEOUS) ×2
SEALANT PROGEL (MISCELLANEOUS) IMPLANT
SEALANT SURG COSEAL 4ML (VASCULAR PRODUCTS) IMPLANT
SEALANT SURG COSEAL 8ML (VASCULAR PRODUCTS) IMPLANT
SEALER LIGASURE MARYLAND 30 (ELECTROSURGICAL) IMPLANT
SET TUBE SMOKE EVAC HIGH FLOW (TUBING) ×2 IMPLANT
SOLUTION ELECTROLUBE (MISCELLANEOUS) IMPLANT
STAPLER 45 DA VINCI SURE FORM (STAPLE) ×2
STAPLER 45 SUREFORM DVNC (STAPLE) IMPLANT
STAPLER CANNULA SEAL DVNC XI (STAPLE) ×2 IMPLANT
STAPLER CANNULA SEAL XI (STAPLE) ×4
STAPLER RELOAD 3.5X45 BLU DVNC (STAPLE) ×3
STAPLER RELOAD 3.5X45 BLUE (STAPLE) ×6
STOPCOCK 4 WAY LG BORE MALE ST (IV SETS) ×2 IMPLANT
SUT PROLENE 3 0 SH DA (SUTURE) IMPLANT
SUT PROLENE 4 0 RB 1 (SUTURE)
SUT PROLENE 4-0 RB1 .5 CRCL 36 (SUTURE) IMPLANT
SUT SILK  1 MH (SUTURE) ×4
SUT SILK 1 MH (SUTURE) ×2 IMPLANT
SUT SILK 1 TIES 10X30 (SUTURE) IMPLANT
SUT SILK 2 0SH CR/8 30 (SUTURE) IMPLANT
SUT VIC AB 1 CTX 18 (SUTURE) IMPLANT
SUT VIC AB 1 CTX 36 (SUTURE)
SUT VIC AB 1 CTX36XBRD ANBCTR (SUTURE) IMPLANT
SUT VIC AB 2-0 CTX 36 (SUTURE) IMPLANT
SUT VIC AB 3-0 X1 27 (SUTURE) ×5 IMPLANT
SUT VICRYL 0 TIES 12 18 (SUTURE) ×2 IMPLANT
SUT VICRYL 0 UR6 27IN ABS (SUTURE) ×5 IMPLANT
SUT VICRYL 2 TP 1 (SUTURE) IMPLANT
SYR 10ML LL (SYRINGE) ×2 IMPLANT
SYR 20ML ECCENTRIC (SYRINGE) ×4 IMPLANT
SYR 20ML LL LF (SYRINGE) ×2 IMPLANT
SYR 50ML LL SCALE MARK (SYRINGE) ×2 IMPLANT
SYSTEM RETRIEVAL ANCHOR 8 (MISCELLANEOUS) ×1 IMPLANT
SYSTEM SAHARA CHEST DRAIN ATS (WOUND CARE) ×2 IMPLANT
TAPE CLOTH 4X10 WHT NS (GAUZE/BANDAGES/DRESSINGS) ×2 IMPLANT
TAPE UMBILICAL COTTON 1/8X30 (MISCELLANEOUS) IMPLANT
TIP APPLICATOR SPRAY EXTEND 16 (VASCULAR PRODUCTS) IMPLANT
TOWEL GREEN STERILE (TOWEL DISPOSABLE) ×2 IMPLANT
TRAY FOLEY MTR SLVR 16FR STAT (SET/KITS/TRAYS/PACK) ×2 IMPLANT
TROCAR XCEL 12X100 BLDLESS (ENDOMECHANICALS) ×2 IMPLANT
TROCAR XCEL BLADELESS 5X75MML (TROCAR) IMPLANT
TUBING EXTENTION W/L.L. (IV SETS) ×2 IMPLANT
WATER STERILE IRR 1000ML POUR (IV SOLUTION) ×2 IMPLANT

## 2019-10-14 NOTE — Anesthesia Procedure Notes (Signed)
Procedure Name: Intubation Date/Time: 10/14/2019 7:46 AM Performed by: Verdie Drown, CRNA Pre-anesthesia Checklist: Patient identified, Emergency Drugs available, Suction available and Patient being monitored Patient Re-evaluated:Patient Re-evaluated prior to induction Oxygen Delivery Method: Circle System Utilized Preoxygenation: Pre-oxygenation with 100% oxygen Induction Type: IV induction Ventilation: Mask ventilation without difficulty and Oral airway inserted - appropriate to patient size Laryngoscope Size: Mac and 3 Grade View: Grade I Tube type: Oral Tube size: 8.5 mm Number of attempts: 1 Airway Equipment and Method: Stylet and Oral airway Placement Confirmation: ETT inserted through vocal cords under direct vision,  positive ETCO2 and breath sounds checked- equal and bilateral Secured at: 23 cm Tube secured with: Tape Dental Injury: Teeth and Oropharynx as per pre-operative assessment

## 2019-10-14 NOTE — Telephone Encounter (Signed)
Spoke with [place] and she verified that the question had been resolved.  Nothing further needed.

## 2019-10-14 NOTE — Transfer of Care (Signed)
Immediate Anesthesia Transfer of Care Note  Patient: [name]  Procedure(s) Performed: XI ROBOTIC ASSISTED THORASCOPY-RIGHT UPPER LOBE LUNG WEDGE RESECTION (Right Chest) INTERCOSTAL NERVE BLOCK (Right Chest)  Patient Location: PACU  Anesthesia Type:General  Level of Consciousness: patient cooperative and responds to stimulation  Airway & Oxygen Therapy: Patient Spontanous Breathing and Patient connected to nasal cannula oxygen  Post-op Assessment: Report given to RN and Post -op Vital signs reviewed and stable  Post vital signs: Reviewed and stable  Last Vitals:  Vitals Value Taken Time  BP 117/62 10/14/19 1125  Temp    Pulse 74 10/14/19 1133  Resp 15 10/14/19 1133  SpO2 98 % 10/14/19 1133  Vitals shown include unvalidated device data.  Last Pain:  Vitals:   10/14/19 0609  TempSrc: Oral  PainSc: 0-No pain         Complications: No complications documented.

## 2019-10-14 NOTE — Anesthesia Procedure Notes (Signed)
Arterial Line Insertion Start/End7/28/2021 6:55 AM, 10/14/2019 7:00 AM Performed by: Verdie Drown, CRNA, CRNA  Patient location: Pre-op. Preanesthetic checklist: patient identified, IV checked, site marked, risks and benefits discussed, surgical consent, monitors and equipment checked, pre-op evaluation, timeout performed and anesthesia consent Lidocaine 1% used for infiltration and patient sedated Left, radial was placed Catheter size: 20 G Hand hygiene performed , maximum sterile barriers used  and Seldinger technique used Allen's test indicative of satisfactory collateral circulation Attempts: 2 (1st attempt JM,CRNA-unable to pass wire. 2nd attempt [facility], CRNA successful.) Procedure performed without using ultrasound guided technique. Following insertion, Biopatch and dressing applied. Post procedure assessment: normal  Patient tolerated the procedure well with no immediate complications.

## 2019-10-14 NOTE — Op Note (Signed)
Video Bronchoscopy with Electromagnetic Navigation Procedure Note  Date of Operation: 10/14/2019  Pre-op Diagnosis: Lung nodule   Post-op Diagnosis: Lung nodule   Surgeon: Garner Nash, DO   Assistants: None   Anesthesia: General endotracheal anesthesia  Operation: Flexible video fiberoptic bronchoscopy with electromagnetic navigation and biopsies.  Estimated Blood Loss: Minimal  Complications: None   Indications and History: [name] is a 86 y.o. male with RUL Lung nodule.  The risks, benefits, complications, treatment options and expected outcomes were discussed with the patient.  The possibilities of pneumothorax, pneumonia, reaction to medication, pulmonary aspiration, perforation of a viscus, bleeding, failure to diagnose a condition and creating a complication requiring transfusion or operation were discussed with the patient who freely signed the consent.    Description of Procedure: The patient was seen in the Preoperative Area, was examined and was deemed appropriate to proceed.  The patient was taken to [place] , identified as [name] and the procedure verified as Flexible Video Fiberoptic Bronchoscopy.  A Time Out was held and the above information confirmed.   Prior to the date of the procedure a high-resolution CT scan of the chest was performed. Utilizing [place] a virtual tracheobronchial tree was generated to allow the creation of distinct navigation pathways to the patient's parenchymal abnormalities. After being taken to the operating room general anesthesia was initiated and the patient  was orally intubated. The video fiberoptic bronchoscope was introduced via the endotracheal tube and a general inspection was performed which showed normal right and left lung anatomy. The extendable working channel and locator guide were introduced into the bronchoscope. The distinct navigation pathways prepared prior to this procedure were then  utilized to navigate to within 0.8cm of patient's lesion(s) identified on CT scan. A full fluoroscopic sweep was obtained from RAO 30 to LAO 20 at inspiratory breath hold of APL 44mmHg. The extendable working channel was secured into place and the locator guide was withdrawn. Under fluoroscopic guidance transbronchial needle was used to enter the lesion. The needle was used for injection of ICG/MB 50/50 mixture for fiducial dye marking. At the end of the procedure a general airway inspection was performed and there was no evidence of active bleeding. The bronchoscope was removed.  The patient tolerated the procedure well. There was no significant blood loss and there were no obvious complications  Samples: 1. None   Plans:  Transferred to [place] [facility] under care of anesthesia and Dr. Servando Snare for robotic thoracic surgery.   Garner Nash, DO [facility] 10/14/2019 8:44 AM

## 2019-10-14 NOTE — Interval H&P Note (Signed)
History and Physical Interval Note:  10/14/2019 7:28 AM  [name]  has presented today for surgery, with the diagnosis of pulmonary nodule.  The various methods of treatment have been discussed with the patient and family. After consideration of risks, benefits and other options for treatment, the patient has consented to  Procedure(s) with comments: [name] (N/A) - with dye marking/RATS following in OR as a surgical intervention.  The patient's history has been reviewed, patient examined, no change in status, stable for surgery.  I have reviewed the patient's chart and labs.  Questions were answered to the patient's satisfaction.      [place]

## 2019-10-14 NOTE — Brief Op Note (Signed)
      [address]       [place] [address]             [phone]      10/14/2019  11:23 AM  PATIENT:  [name]  86 y.o. male  PRE-OPERATIVE DIAGNOSIS:  RUL PULMONARY NODULE  POST-OPERATIVE DIAGNOSIS:  RUL PULMONARY NODULE-granuloma no evidence of maligancy  PROCEDURE:  Procedure(s): XI ROBOTIC ASSISTED THORASCOPY-RIGHT UPPER LOBE LUNG WEDGE RESECTION (Right) INTERCOSTAL NERVE BLOCK (Right)  SURGEON:  Surgeon(s) and Role:    * Grace Isaac, MD - Primary  PHYSICIAN ASSISTANT:   ASSISTANTS: Shaaron Adler PA  ANESTHESIA:   general  EBL:  50 mL   BLOOD ADMINISTERED:none  DRAINS: left 28 chest tube    LOCAL MEDICATIONS USED:  BUPIVICAINE  and OTHER espirial     DISPOSITION OF SPECIMEN:  PATHOLOGY  COUNTS:  YES   DICTATION: .Dragon Dictation  PLAN OF CARE: Admit to inpatient   PATIENT DISPOSITION:  PACU - hemodynamically stable.   Delay start of Pharmacological VTE agent (>24hrs) due to surgical blood loss or risk of bleeding: yes

## 2019-10-14 NOTE — Anesthesia Procedure Notes (Signed)
Procedure Name: Intubation Date/Time: 10/14/2019 8:32 AM Performed by: Verdie Drown, CRNA Pre-anesthesia Checklist: Patient identified, Emergency Drugs available, Suction available and Patient being monitored Patient Re-evaluated:Patient Re-evaluated prior to induction Oxygen Delivery Method: Circle System Utilized Preoxygenation: Pre-oxygenation with 100% oxygen Induction Type: IV induction Ventilation: Mask ventilation without difficulty Laryngoscope Size: Mac and 3 Grade View: Grade I Tube type: Oral Endobronchial tube: Left, EBT position confirmed by auscultation, Double lumen EBT and EBT position confirmed by fiberoptic bronchoscope and 37 Fr Number of attempts: 2 (G1 view, 1st attempt esophageal intubation, DLT removed. 2nd DL successful/atraumatic intubation.) Airway Equipment and Method: Stylet and Oral airway Placement Confirmation: ETT inserted through vocal cords under direct vision,  positive ETCO2 and breath sounds checked- equal and bilateral Secured at: 31 cm Tube secured with: Tape Dental Injury: Teeth and Oropharynx as per pre-operative assessment

## 2019-10-14 NOTE — Progress Notes (Signed)
EVENING ROUNDS NOTE :     [place]       [place] [address]             [phone]                 Day of Surgery Procedure(s) (LRB): XI ROBOTIC ASSISTED THORASCOPY-RIGHT UPPER LOBE LUNG WEDGE RESECTION (Right) INTERCOSTAL NERVE BLOCK (Right)   Total Length of Stay:  LOS: 0 days  Events:   No events 160 from CT Pain controlled Clear CXR     BP (!) 129/77   Pulse 78   Temp 97.7 F (36.5 C) (Oral)   Resp 14   Ht 5\' 8"  (1.727 m)   Wt 75.6 kg   SpO2 100%   BMI 25.33 kg/m         . sodium chloride    .  ceFAZolin (ANCEF) IV 2 g (10/14/19 1402)    No intake/output data recorded.   CBC Latest Ref Rng & Units 10/14/2019 10/12/2019 06/04/2019  WBC 4.0 - 10.5 K/uL - 7.0 6.2  Hemoglobin 13.0 - 17.0 g/dL - 14.0 13.0  Hematocrit 39 - 52 % - 43.5 40.3  Platelets 150 - 400 K/uL 91(L) PLATELET CLUMPS NOTED ON SMEAR, COUNT APPEARS DECREASED 87(LL)    BMP Latest Ref Rng & Units 10/12/2019 06/25/2019 06/04/2019  Glucose 70 - 99 mg/dL 97 84 76  BUN 8 - 23 mg/dL 55(H) 63(H) 62(H)  Creatinine 0.61 - 1.24 mg/dL 2.32(H) 1.97(H) 1.94(H)  BUN/Creat Ratio 10 - 24 - 32(H) 32(H)  Sodium 135 - 145 mmol/L 139 142 143  Potassium 3.5 - 5.1 mmol/L 3.5 4.3 5.2  Chloride 98 - 111 mmol/L 103 99 98  CO2 22 - 32 mmol/L 22 24 27   Calcium 8.9 - 10.3 mg/dL 9.5 9.8 10.0    ABG    Component Value Date/Time   PHART 7.465 (H) 10/12/2019 1355   PCO2ART 35.7 10/12/2019 1355   PO2ART 109 (H) 10/12/2019 1355   HCO3 25.3 10/12/2019 1355   TCO2 25 05/26/2019 1352   O2SAT 98.3 10/12/2019 [place], MD 10/14/2019 4:31 PM

## 2019-10-14 NOTE — H&P (Signed)
[address]       [place] [address]             [phone]                    Vignesh B. Redner Boyden Medical Record #[id number] Date of Birth: 1931-07-04  Referring: No ref. provider found Primary Care: [name], MD Primary Cardiologist: Buford Dresser, MD  Chief Complaint:    Chief Complaint  Patient presents with  . Lung Lesion    surgical eval for lung resection     History of Present Illness:    [name] 86 y.o. male is seen in the office  today for consideration of wedge resection of right lung lesion referred by Dr. Lamonte Sakai.  The patient is known to me from 2004 when he underwent coronary artery bypass grafting and mitral valve repair.  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  More recently developed increasing signs and symptoms of congestive heart failure, low cardiac output and poor LV function.  In March 2021 because of progressive aortic stenosis he underwent placement of percutaneous aortic valve-TAVR.  His wife notes that since his TAVR valve his functional status has markedly improved-he had gone from very limited mobility due to shortness of breath to near normal activities.  Echocardiogram done after the TAVR April 2021 still noted ejection fraction less than 20% mild to moderate mitral regurgitation.    Patient has a distant history of smoking but quit in 1957  Patient had been referred by Dr. Radford Pax from cardiology to Dr. Chase Caller for abnormal pulmonary function testing-he had been started on amnio several years ago-in 2017 pulmonary functions were normal except mild restriction and total lung capacity and slight reduction in his DLCO-previous CT scan suggested possible ILD versus atelectasis.  Dr. Chase Caller noted undifferentiated interstitial lung disease stable since 2015  CT scan done by Dr. Chase Caller May 2021  Peripheral  right upper lobe 10 x 5 mm solid pulmonary nodule is slightly increased from 9 x 5 mm on 05/13/2019 CT and is clearly increased from 5 x 3 mm on 12/09/2018 CT. Primary bronchogenic carcinoma not excluded. Consider PET-CT for further evaluation versus continued close chest CT surveillance in 3 months   Current Activity/ Functional Status:  Patient is independent with mobility/ambulation, transfers, ADL's, IADL's.   Zubrod Score: At the time of surgery this patient's most appropriate activity status/level should be described as: []     0    Normal activity, no symptoms [x]     1    Restricted in physical strenuous activity but ambulatory, able to do out light work []     2    Ambulatory and capable of self care, unable to do work activities, up and about               >50 % of waking hours                              []     3    Only limited self care, in bed greater than 50% of waking hours []     4    Completely disabled, no self care, confined to bed or chair []     5    Moribund   Past Medical History:  Diagnosis Date  . Adenomatous colon polyp   . CAD (coronary artery disease)    a. S/P CABG x 1 @ time of MV annuloplasty;  b. 02/2010 ETT: neg for ischemia.  . Carotid artery stenosis    1-39% by dopplers 10/2016  . Chronic combined systolic (congestive) and diastolic (congestive) heart failure ([place])   . CKD (chronic kidney disease), stage III   . Current use of long term anticoagulation    a. Coumadin in setting of afib.  . Diverticulosis   . Dyslipidemia   . Dysrhythmia    A-fib  . Essential hypertension   . H/O mitral valve repair 09/16/2002   Complex valvuloplasty including quadrangular resection of posterior leaflet with artificial Gore-tex neochords and 28 mm Seguin ring annuloplasty - Dr Servando Snare  . Hemorrhoids   . Hyperlipidemia   . Hypertension   . Permanent atrial fibrillation (HCC)    a. CHA2DS2VASc = 6 -->chronic coumadin;  b. Prev on amio-->d/c 2/2 hypothyroidism.  >> resumed 5/16. c. Notes from 2017 indicate interstitial lung disease by pulm appt, question amio toxicity, also increased liver attenuation. Rate control pursued and amiodarone discontinued.  . S/P CABG x 1 09/16/2002   SVG to OM  . 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 Sleep apnea 12/2018   per patient's spouse wears BIPAP "every other night or so"  . Thrombocytopenia ([place])   . TIA (transient ischemic attack)     Past Surgical History:  Procedure Laterality Date  . CATARACT EXTRACTION W/ INTRAOCULAR LENS  IMPLANT, BILATERAL     per patient about 4 years ago  . CORONARY ARTERY BYPASS GRAFT  2004  . INTRAOPERATIVE TRANSTHORACIC ECHOCARDIOGRAM N/A 05/26/2019   Procedure: INTRAOPERATIVE TRANSTHORACIC ECHOCARDIOGRAM;  Surgeon: Sherren Mocha, MD;  Location: [facility];  Service: Open Heart Surgery;  Laterality: N/A;  . MITRAL VALVE REPAIR    . RIGHT/LEFT HEART CATH AND CORONARY/GRAFT ANGIOGRAPHY N/A 04/28/2019   Procedure: RIGHT/LEFT HEART CATH AND CORONARY/GRAFT ANGIOGRAPHY;  Surgeon: Sherren Mocha, MD;  Location: [facility];  Service: Cardiovascular;  Laterality: N/A;  . TEE WITHOUT CARDIOVERSION N/A 05/20/2019   Procedure: TRANSESOPHAGEAL ECHOCARDIOGRAM (TEE);  Surgeon: Donato Heinz, MD;  [facility];  Service: Cardiovascular;  Laterality: N/A;  . TRANSCATHETER AORTIC VALVE REPLACEMENT, TRANSFEMORAL N/A 05/26/2019   Procedure: TRANSCATHETER AORTIC VALVE REPLACEMENT, TRANSFEMORAL;  Surgeon: Sherren Mocha, MD;  Location: [facility];  Service: Open Heart Surgery;  Laterality: N/A;    Family History  Problem Relation Age of Onset  . Stroke Mother        Deceased  . Heart disease Father        Deceased  . Heart failure Brother        Deceased  . Stroke Brother      Social History   Tobacco Use  Smoking Status Former Smoker  . Packs/day: 0.10  . Years:  2.00  . Pack years: 0.20  . Types: Cigarettes  . Quit date: 03/20/1955  . Years since quitting: 64.6  Smokeless Tobacco Never Used    Social History   Substance and Sexual Activity  Alcohol Use No  . Alcohol/week: 0.0 standard drinks     Allergies  Allergen Reactions  . Asa [Aspirin] Other (See Comments)    Bleeding- has internal hemorrhoids  . Flomax [Tamsulosin] Palpitations and Other (See Comments)  Made his heart go out of rhythm  . Pirfenidone     Weight loss, loss of appetite.    Current Facility-Administered Medications  Medication Dose Route Frequency Provider Last Rate Last Admin  . ceFAZolin (ANCEF) IVPB 2g/100 mL premix  2 g Intravenous 30 min Pre-Op Grace Isaac, MD      . lactated ringers infusion   Intravenous Continuous Roberts Gaudy, MD        Pertinent items are noted in HPI.   Review of Systems:     Cardiac Review of Systems: [Y] = yes  or   [ N ] = no   Chest Pain [ n   ]  Resting SOB Florencio.Farrier   ] Exertional SOB  Blue.Reese  ]  Orthopnea [ n ]   Pedal Edema [  n ]    Palpitations [n  ] Syncope  [ n ]   Presyncope [ n  ]   General Review of Systems: [Y] = yes [  ]=no Constitional: recent weight change [  ];  Wt loss over the last 3 months [   ] anorexia [  ]; fatigue [  ]; nausea [  ]; night sweats [  ]; fever [  ]; or chills [  ];           Eye : blurred vision [  ]; diplopia [   ]; vision changes [  ];  Amaurosis fugax[  ]; Resp: cough [  ];  wheezing[  ];  hemoptysis[  ]; shortness of breath[  ]; paroxysmal nocturnal dyspnea[  ]; dyspnea on exertion[  ]; or orthopnea[  ];  GI:  gallstones[  ], vomiting[  ];  dysphagia[  ]; melena[  ];  hematochezia [  ]; heartburn[  ];   Hx of  Colonoscopy[  ]; GU: kidney stones [  ]; hematuria[  ];   dysuria [  ];  nocturia[  ];  history of     obstruction [  ]; urinary frequency [  ]             Skin: rash, swelling[  ];, hair loss[  ];  peripheral edema[  ];  or itching[  ]; Musculosketetal: myalgias[  ];  joint swelling[   ];  joint erythema[  ];  joint pain[  ];  back pain[  ];  Heme/Lymph: bruising[  ];  bleeding[  ];  anemia[  ];  Neuro: TIA[  ];  headaches[  ];  stroke[  ];  vertigo[  ];  seizures[  ];   paresthesias[  ];  difficulty walking[  ];  Psych:depression[  ]; anxiety[  ];  Endocrine: diabetes[  ];  thyroid dysfunction[  ];  Immunizations: Flu up to date [ y ]; Pneumococcal up to date Blue.Reese  ]; covid  Y  Other:     PHYSICAL EXAMINATION: BP (!) 141/61   Pulse 66   Temp 97.8 F (36.6 C) (Oral)   Resp 18   Ht 5\' 8"  (1.727 m)   Wt 75.6 kg   SpO2 100%   BMI 25.33 kg/m  General appearance: alert, cooperative and no distress Head: Normocephalic, without obvious abnormality, atraumatic Neck: no adenopathy, no carotid bruit, no JVD, supple, symmetrical, trachea midline and thyroid not enlarged, symmetric, no tenderness/mass/nodules Lymph nodes: Cervical, supraclavicular, and axillary nodes normal. Resp: clear to auscultation bilaterally Cardio: irregularly irregular rhythm, systolic murmur: early systolic 2/6, crescendo at 2nd left intercostal space and no rub GI: soft,  non-tender; bowel sounds normal; no masses,  no organomegaly Extremities: extremities normal, atraumatic, no cyanosis or edema Neurologic: Grossly normal  Diagnostic Studies & Laboratory data:     Recent Radiology Findings:  CT Chest High Resolution  Result Date: 07/31/2019 CLINICAL DATA:  Abnormal pulmonary function tests on amiodarone therapy. Evaluate for interstitial lung disease. Status post TAVR. EXAM: CT CHEST WITHOUT CONTRAST TECHNIQUE: Multidetector CT imaging of the chest was performed following the standard protocol without intravenous contrast. High resolution imaging of the lungs, as well as inspiratory and expiratory imaging, was performed. COMPARISON:  05/22/2019 chest radiograph. 05/13/2019 chest CT angiogram. 12/09/2018 high-resolution chest CT. FINDINGS: Cardiovascular: Mild cardiomegaly. Aortic valve prosthesis  in place. No significant pericardial effusion/thickening. Three-vessel coronary atherosclerosis status post CABG. Atherosclerotic nonaneurysmal thoracic aorta. Top-normal caliber main pulmonary artery (3.0 cm diameter). Mediastinum/Nodes: No discrete thyroid nodules. Unremarkable esophagus. No pathologically enlarged axillary, mediastinal or hilar lymph nodes, noting limited sensitivity for the detection of hilar adenopathy on this noncontrast study. Lungs/Pleura: No pneumothorax. No pleural effusion. No acute consolidative airspace disease or lung masses. Peripheral right upper lobe solid 10 x 5 mm pulmonary nodule (series 3/image 53), previously 9 x 5 mm on 05/13/2019 CT and 5 x 3 mm on 12/09/2018 CT, increasing. No additional significant pulmonary nodules. There is patchy mild-to-moderate subpleural reticulation and ground-glass opacity in both lungs with a strong basilar predominance. No significant regions of traction bronchiectasis, architectural distortion or frank honeycombing. Comparison is limited by the presence of pleural effusions on the prior scan. Findings are not definitely progressed. No significant regions of air trapping or findings of tracheobronchomalacia on the expiration sequence. Upper abdomen: No acute abnormality. Musculoskeletal: No aggressive appearing focal osseous lesions. Intact sternotomy wires. Moderate thoracic spondylosis. IMPRESSION: 1. Peripheral right upper lobe 10 x 5 mm solid pulmonary nodule is slightly increased from 9 x 5 mm on 05/13/2019 CT and is clearly increased from 5 x 3 mm on 12/09/2018 CT. Primary bronchogenic carcinoma not excluded. Consider PET-CT for further evaluation versus continued close chest CT surveillance in 3 months. 2. Spectrum of findings suggestive of basilar predominant interstitial lung disease without significant bronchiectasis or honeycombing, not clearly changed in the interval, see comments. Differential includes nonspecific interstitial  pneumonia (NSIP) or usual interstitial pneumonia (UIP). Amiodarone pulmonary toxicity is on the differential. Findings are indeterminate for UIP per consensus guidelines: Diagnosis of Idiopathic Pulmonary Fibrosis: An Official ATS/ERS/JRS/ALAT Clinical Practice Guideline. [place], Iss 5, [phone], Nov 17 2016. 3. Mild cardiomegaly. 4. Aortic Atherosclerosis (ICD10-I70.0). Electronically Signed   By: Ilona Sorrel M.D.   On: 07/31/2019 17:35   NM PET Image Initial (PI) Skull Base To Thigh  Result Date: 09/01/2019 CLINICAL DATA:  Initial treatment strategy for pulmonary nodule. EXAM: NUCLEAR MEDICINE PET SKULL BASE TO THIGH TECHNIQUE: 8.3 mCi F-18 FDG was injected intravenously. Full-ring PET imaging was performed from the skull base to thigh after the radiotracer. CT data was obtained and used for attenuation correction and anatomic localization. Fasting blood glucose: 112 mg/dl COMPARISON:  CT chest 07/31/2019 FINDINGS: Mediastinal blood pool activity: SUV max 2.5 Liver activity: SUV max N/A NECK: No significant abnormal hypermetabolic activity in this region. Incidental CT findings: Bilateral common carotid atherosclerotic calcification. CHEST: The right upper lobe nodule of concern measures 1.0 by 0.5 cm on image 25/8 and has a maximum SUV of 3.8, compatible with malignancy. Incidental CT findings: Prosthetic aortic valve. Coronary, aortic arch, and branch vessel atherosclerotic vascular disease. Prior CABG. Mild  to moderate cardiomegaly. ABDOMEN/PELVIS: No significant abnormal hypermetabolic activity in this region. Incidental CT findings: Aortoiliac atherosclerotic vascular disease. Mild proximal sigmoid colon diverticulosis. SKELETON: Mildly accentuated activity along Schmorl's nodes in the lumbar spine. Incidental CT findings: Lumbar spondylosis.  Cervical spondylosis. IMPRESSION: 1. The 1.0 by 0.5 cm right upper lobe nodule has a maximum SUV of 3.8, compatible with malignancy. No  findings of hypermetabolic adenopathy or distant spread. 2. Other imaging findings of potential clinical significance: Aortic Atherosclerosis (ICD10-I70.0). Coronary atherosclerosis. Mild to moderate cardiomegaly. Aortic valve prosthesis. Prior CABG. Mild proximal sigmoid colon diverticulosis. Lumbar and cervical spondylosis. Electronically Signed   By: Van Clines M.D.   On: 09/01/2019 09:35   I have independently reviewed the above radiology studies  and reviewed the findings with the patient.   Recent Lab Findings: Lab Results  Component Value Date   WBC 7.0 10/12/2019   HGB 14.0 10/12/2019   HCT 43.5 10/12/2019   PLT  10/12/2019    PLATELET CLUMPS NOTED ON SMEAR, COUNT APPEARS DECREASED   GLUCOSE 97 10/12/2019   CHOL 193 06/17/2017   TRIG 114 06/17/2017   HDL 35 (L) 06/17/2017   LDLCALC 135 (H) 06/17/2017   ALT 17 10/12/2019   AST 23 10/12/2019   NA 139 10/12/2019   K 3.5 10/12/2019   CL 103 10/12/2019   CREATININE 2.32 (H) 10/12/2019   BUN 55 (H) 10/12/2019   CO2 22 10/12/2019   TSH 3.558 12/24/2017   INR 1.2 10/12/2019   HGBA1C 5.5 05/22/2019   PFT's February 2021 FEV1 2.43     95% predicted DLCO 15.32 66% predicted  ECHO: ECHOCARDIOGRAM REPORT   Echo 10/06/2019: by estimation, is 25 to 30%. The left ventricle has severely decreased function. The left ventricle demonstrates global hypokinesis. There is mild left ventricular hypertrophy. Left ventricular diastolic parameters are indeterminate. 2. Right ventricular systolic function is mildly reduced. The right ventricular size is normal. There is normal pulmonary artery systolic pressure. The estimated right ventricular systolic pressure is 26.3 mmHg. 3. Left atrial size was moderately dilated. 4. The mitral valve has been repaired/replaced. Mild mitral valve regurgitation. No evidence of mitral stenosis. The mean mitral valve gradient is 2.0 mmHg with average heart rate of 60 bpm. There is a 28 mm prosthetic  annuloplasty ring present in the mitral position. 5. The aortic valve has been repaired/replaced. Aortic valve regurgitation is trivial. There is a 29 mm Edwards Edwards Sapien prosthetic (TAVR) valve present in the aortic position. Echo findings are consistent with a trivial perivalvular leak of the aortic prosthesis. Aortic valve peak and mean gradients of 4 and 7 mmHg, respectively. AVA 2.75 cm2. 6. Aortic dilatation noted. There is borderline dilatation at the level of the sinuses of Valsalva measuring 38 mm. The ascending aorta measures normal in size. 7. The inferior vena cava is normal in size with greater than 50% respiratory variability, suggesting right atrial pressure of 3 mmHg.  Patient Name:  [name] Date of Exam: 06/25/2019  Medical Rec #: [id number]   Height:    69.0 in  Accession #:  [id number]   Weight:    163.4 lb  Date of Birth: January 24, 1932   BSA:     1.896 m  Patient Age:  75 years    BP:      100/60 mmHg  Patient Gender: M       HR:      72 bpm.  Exam Location: [place]   Procedure: 2D Echo, Cardiac  Doppler and Color Doppler   Indications:  Z95.2 Status post TAVR    History:    Patient has prior history of Echocardiogram examinations,  most         recent 05/27/2019. Status post Mitral Valve Repair-28 mm  Seguin         Ring Annuloplasty; Status post TAVR-29 mm Edwards Sapien;  Risk         Factors:Hypertension and Dyslipidemia. Atrial  Fibrillation. TIA.         Chronic kidney disease.    Sonographer:  Cresenciano Lick RDCS  Referring Phys: [id number] [place]    1. Left ventricular ejection fraction, by estimation, is <20%. The left  ventricle has severely decreased function. The left ventricle demonstrates  global hypokinesis. The left ventricular internal cavity size was mildly  dilated. Left ventricular  diastolic  parameters are indeterminate.  2. S/p mitral valve repair. Mild-moderate mitral regurgitation. Mean  gradient 4 mmHg, mild mitral stenosis.  3. Bioprosthetic aortic valve s/p TAVR, 29 mm Edwards Sapien. Trivial  perivalvular regurgitation. Mean gradient 4 mmHg, no significant stenosis.  4. Left atrial size was moderately dilated.  5. Right ventricular systolic function is mildly reduced. The right  ventricular size is mildly enlarged. There is mildly elevated pulmonary  artery systolic pressure. The estimated right ventricular systolic  pressure is 57.3 mmHg.  6. The inferior vena cava is normal in size with <50% respiratory  variability, suggesting right atrial pressure of 8 mmHg.   FINDINGS  Left Ventricle: Left ventricular ejection fraction, by estimation, is  <20%. The left ventricle has severely decreased function. The left  ventricle demonstrates global hypokinesis. The left ventricular internal  cavity size was mildly dilated. There is no  left ventricular hypertrophy. Left ventricular diastolic parameters are  indeterminate.   Right Ventricle: The right ventricular size is mildly enlarged. No  increase in right ventricular wall thickness. Right ventricular systolic  function is mildly reduced. There is mildly elevated pulmonary artery  systolic pressure. The tricuspid regurgitant  velocity is 2.62 m/s, and with an assumed right atrial pressure of 8  mmHg, the estimated right ventricular systolic pressure is 22.0 mmHg.   Left Atrium: Left atrial size was moderately dilated.   Right Atrium: Right atrial size was normal in size.   Pericardium: There is no evidence of pericardial effusion.   Mitral Valve: S/p mitral valve repair. Mild-moderate mitral regurgitation.  Mean gradient 4 mmHg, mild mitral stenosis. The mitral valve has been  repaired/replaced. Mild to moderate mitral valve regurgitation. MV peak  gradient, 8.1 mmHg. The mean mitral  valve gradient is 4.0  mmHg.   Tricuspid Valve: The tricuspid valve is normal in structure. Tricuspid  valve regurgitation is mild.   Aortic Valve: Bioprosthetic aortic valve s/p TAVR, 29 mm Edwards Sapien.  Trivial perivalvular regurgitation. Mean gradient 4 mmHg, no significant  stenosis. The aortic valve has been repaired/replaced. Aortic valve  regurgitation is trivial. Aortic valve  mean gradient measures 4.2 mmHg. Aortic valve peak gradient measures 6.1  mmHg. Aortic valve area, by VTI measures 1.97 cm.   Pulmonic Valve: The pulmonic valve was normal in structure. Pulmonic valve  regurgitation is not visualized.   Aorta: The aortic root is normal in size and structure.   Venous: The inferior vena cava is normal in size with less than 50%  respiratory variability, suggesting right atrial pressure of 8 mmHg.   IAS/Shunts: No atrial level shunt detected by color flow Doppler.  LEFT VENTRICLE  PLAX 2D  LVIDd:     6.30 cm  LVIDs:     4.80 cm  LV PW:     0.80 cm  LV IVS:    0.70 cm  LVOT diam:   2.30 cm  LV SV:     46  LV SV Index:  24  LVOT Area:   4.15 cm     RIGHT VENTRICLE  RV Basal diam: 5.30 cm  RV S prime:   6.85 cm/s  TAPSE (M-mode): 0.9 cm   LEFT ATRIUM       Index    RIGHT ATRIUM      Index  LA diam:    5.20 cm 2.74 cm/m RA Area:   17.10 cm  LA Vol (A2C):  103.0 ml 54.33 ml/m RA Volume:  47.90 ml 25.26 ml/m  LA Vol (A4C):  79.7 ml 42.04 ml/m  LA Biplane Vol: 92.3 ml 48.68 ml/m  AORTIC VALVE  AV Area (Vmax):  1.76 cm  AV Area (Vmean):  1.61 cm  AV Area (VTI):   1.97 cm  AV Vmax:      123.50 cm/s  AV Vmean:     96.975 cm/s  AV VTI:      0.231 m  AV Peak Grad:   6.1 mmHg  AV Mean Grad:   4.2 mmHg  LVOT Vmax:     52.30 cm/s  LVOT Vmean:    37.500 cm/s  LVOT VTI:     0.110 m  LVOT/AV VTI ratio: 0.47    AORTA  Ao Root diam: 3.40 cm   MITRAL VALVE        TRICUSPID VALVE  MV Area (PHT): 1.73 cm  TR Peak grad:  27.5 mmHg  MV Peak grad: 8.1 mmHg  TR Vmax:    262.00 cm/s  MV Mean grad: 4.0 mmHg  MV Vmax:    1.42 m/s  SHUNTS  MV Vmean:   92.9 cm/s Systemic VTI: 0.11 m  MR Peak grad: 52.4 mmHg  Systemic Diam: 2.30 cm  MR Mean grad: 39.0 mmHg  MR Vmax:   362.00 cm/s  MR Vmean:   303.0 cm/s  MV E velocity: 1.48 cm/s   Loralie Champagne MD  Electronically signed by Loralie Champagne MD  Signature Date/Time: 06/25/2019/9:19:56 PM    CATH: before TAVR 1.  Nonobstructive coronary disease with mild diffuse stenosis of the LAD, left circumflex, and RCA 2.  Total occlusion of the first OM branch of the circumflex with continued patency of the saphenous vein graft to OM 3.  Severe aortic stenosis with mean gradient 33 mmHg and calculated valve area 0.8 cm 4.  Large V waves consistent with severe mitral regurgitation  Recommendation: Continue multidisciplinary evaluation for this patient with severe low-flow low gradient aortic stenosis, probable severe mitral regurgitation with previous mitral repair, and progressive heart failure symptoms   Assessment / Plan:   #1 slowly enlarging peripheral right upper lobe lung nodule suspicious for malignancy change in size and PET scan-patient referred by Dr. Lamonte Sakai to consider preoperative marking and wedge resection-I discussed this approach with the patient and his wife.  With the peripheral location of the lesion marking and robotic wedge resection and node sampling is reasonable approach-especially with the patient's significant improvement in his overall functional capacity with the recent TAVR valve. #2 history of coronary artery disease mitral regurgitation and aortic stenosis-status post coronary artery bypass and mitral valve repair 17 years ago and TAVR valve earlier this year #3  low ejection fraction-last echo notes less than 20%-we will plan repeat echocardiogram to evaluate LV  function before proceeding with surgery-especially in light of the patient's significant symptomatic improvement since his TAVR valve. #4 chronic anticoagulation on Eliquis 2.5 twice a day for atrial fibrillation    Risks and options of surgical resection versus attempted needle biopsy or navigational bronc biopsy and subsequent treatment with radiation have been discussed with the patient and his wife.  He is agreeable and would prefer surgical resection.  With his underlying pulmonary disease and cardiac disease and age would not recommend lobectomy. The goals risks and alternatives of the planned surgical procedure Procedure(s): XI ROBOTIC ASSISTED THORASCOPY-WEDGE RESECTION (Right)  have been discussed with the patient in detail. The risks of the procedure including death, infection, stroke, myocardial infarction, bleeding, blood transfusion have all been discussed specifically.  I have quoted [name] a 5 % of perioperative mortality and a complication rate as high as 40 %. The patient's questions have been answered.[name] is willing  to proceed with the planned procedure.    Grace Isaac MD      [address] [place] [id number] Office [phone]     10/14/2019 6:57 AM

## 2019-10-14 NOTE — Anesthesia Postprocedure Evaluation (Signed)
Anesthesia Post Note  Patient: [name]  Procedure(s) Performed: XI ROBOTIC ASSISTED THORASCOPY-RIGHT UPPER LOBE LUNG WEDGE RESECTION (Right Chest) INTERCOSTAL NERVE BLOCK (Right Chest)     Patient location during evaluation: PACU Anesthesia Type: General Level of consciousness: awake and alert Pain management: pain level controlled Vital Signs Assessment: post-procedure vital signs reviewed and stable Respiratory status: spontaneous breathing, nonlabored ventilation, respiratory function stable and patient connected to nasal cannula oxygen Cardiovascular status: blood pressure returned to baseline and stable Postop Assessment: no apparent nausea or vomiting Anesthetic complications: no   No complications documented.  Last Vitals:  Vitals:   10/14/19 1155 10/14/19 1200  BP: 128/71   Pulse:  79  Resp: 17 15  Temp:    SpO2:  100%    Last Pain:  Vitals:   10/14/19 1150  TempSrc:   PainSc: Asleep                 Kiara Mcdowell COKER

## 2019-10-14 NOTE — Anesthesia Procedure Notes (Addendum)
Central Venous Catheter Insertion Performed by: Roberts Gaudy, MD, anesthesiologist Start/End7/28/2021 6:10 AM, 10/14/2019 6:20 AM Patient location: Pre-op. Preanesthetic checklist: patient identified, IV checked, site marked, risks and benefits discussed, surgical consent, monitors and equipment checked, pre-op evaluation, timeout performed and anesthesia consent Position: Trendelenburg Lidocaine 1% used for infiltration and patient sedated Hand hygiene performed , maximum sterile barriers used  and Seldinger technique used Catheter size: 8 Fr Total catheter length 16. Central line was placed.Double lumen Procedure performed using ultrasound guided technique. Ultrasound Notes:anatomy identified, needle tip was noted to be adjacent to the nerve/plexus identified, no ultrasound evidence of intravascular and/or intraneural injection and image(s) printed for medical record Attempts: 1 Following insertion, dressing applied, line sutured and Biopatch. Post procedure assessment: blood return through all ports  Patient tolerated the procedure well with no immediate complications.

## 2019-10-15 ENCOUNTER — Encounter (HOSPITAL_COMMUNITY): Payer: Self-pay | Admitting: Cardiothoracic Surgery

## 2019-10-15 LAB — CBC
HCT: 37.7 % — ABNORMAL LOW (ref 39.0–52.0)
Hemoglobin: 12.4 g/dL — ABNORMAL LOW (ref 13.0–17.0)
MCH: 30.2 pg (ref 26.0–34.0)
MCHC: 32.9 g/dL (ref 30.0–36.0)
MCV: 91.7 fL (ref 80.0–100.0)
Platelets: 94 10*3/uL — ABNORMAL LOW (ref 150–400)
RBC: 4.11 MIL/uL — ABNORMAL LOW (ref 4.22–5.81)
RDW: 13 % (ref 11.5–15.5)
WBC: 11.2 10*3/uL — ABNORMAL HIGH (ref 4.0–10.5)
nRBC: 0 % (ref 0.0–0.2)

## 2019-10-15 LAB — POCT I-STAT 7, (LYTES, BLD GAS, ICA,H+H)
Acid-Base Excess: 4 mmol/L — ABNORMAL HIGH (ref 0.0–2.0)
Bicarbonate: 27.7 mmol/L (ref 20.0–28.0)
Calcium, Ion: 1.22 mmol/L (ref 1.15–1.40)
HCT: 36 % — ABNORMAL LOW (ref 39.0–52.0)
Hemoglobin: 12.2 g/dL — ABNORMAL LOW (ref 13.0–17.0)
O2 Saturation: 98 %
Patient temperature: 98.1
Potassium: 3.6 mmol/L (ref 3.5–5.1)
Sodium: 144 mmol/L (ref 135–145)
TCO2: 29 mmol/L (ref 22–32)
pCO2 arterial: 37.1 mmHg (ref 32.0–48.0)
pH, Arterial: 7.48 — ABNORMAL HIGH (ref 7.350–7.450)
pO2, Arterial: 89 mmHg (ref 83.0–108.0)

## 2019-10-15 LAB — BASIC METABOLIC PANEL
Anion gap: 11 (ref 5–15)
BUN: 47 mg/dL — ABNORMAL HIGH (ref 8–23)
CO2: 24 mmol/L (ref 22–32)
Calcium: 9 mg/dL (ref 8.9–10.3)
Chloride: 107 mmol/L (ref 98–111)
Creatinine, Ser: 2.12 mg/dL — ABNORMAL HIGH (ref 0.61–1.24)
GFR calc Af Amer: 32 mL/min — ABNORMAL LOW (ref >60)
GFR calc non Af Amer: 28 mL/min — ABNORMAL LOW (ref >60)
Glucose, Bld: 118 mg/dL — ABNORMAL HIGH (ref 70–99)
Potassium: 3.4 mmol/L — ABNORMAL LOW (ref 3.5–5.1)
Sodium: 142 mmol/L (ref 135–145)

## 2019-10-15 LAB — GLUCOSE, CAPILLARY
Glucose-Capillary: 104 mg/dL — ABNORMAL HIGH (ref 70–99)
Glucose-Capillary: 220 mg/dL — ABNORMAL HIGH (ref 70–99)
Glucose-Capillary: 79 mg/dL (ref 70–99)
Glucose-Capillary: 145 mg/dL — ABNORMAL HIGH (ref 70–99)

## 2019-10-15 LAB — ACID FAST SMEAR (AFB, MYCOBACTERIA): Acid Fast Smear: NEGATIVE

## 2019-10-15 MED ORDER — SODIUM CHLORIDE 0.9% FLUSH
3.0000 mL | Freq: Two times a day (BID) | INTRAVENOUS | Status: DC
  Administered 2019-10-15 – 2019-10-17 (×4): 3 mL via INTRAVENOUS

## 2019-10-15 MED ORDER — INSULIN ASPART 100 UNIT/ML ~~LOC~~ SOLN: 0.0000 [IU] | Freq: Three times a day (TID) | SUBCUTANEOUS | Status: DC

## 2019-10-15 MED ORDER — SODIUM CHLORIDE 0.9 % IV SOLN: 250.0000 mL | INTRAVENOUS | Status: DC | PRN

## 2019-10-15 MED ORDER — POTASSIUM CHLORIDE CRYS ER 20 MEQ PO TBCR
20.0000 meq | EXTENDED_RELEASE_TABLET | ORAL | Status: AC
  Administered 2019-10-15 (×3): 20 meq via ORAL
  Filled 2019-10-15 (×3): qty 1

## 2019-10-15 MED ORDER — ADULT MULTIVITAMIN W/MINERALS CH
1.0000 | ORAL_TABLET | Freq: Every day | ORAL | Status: DC
  Administered 2019-10-15 – 2019-10-17 (×3): 1 via ORAL
  Filled 2019-10-15 (×3): qty 1

## 2019-10-15 MED ORDER — SODIUM CHLORIDE 0.9% FLUSH: 3.0000 mL | INTRAVENOUS | Status: DC | PRN

## 2019-10-15 MED ORDER — ~~LOC~~ CARDIAC SURGERY, PATIENT & FAMILY EDUCATION: Freq: Once | Status: AC

## 2019-10-15 MED ORDER — ENSURE ENLIVE PO LIQD
237.0000 mL | Freq: Three times a day (TID) | ORAL | Status: DC
  Administered 2019-10-15 – 2019-10-17 (×4): 237 mL via ORAL

## 2019-10-15 NOTE — Progress Notes (Signed)
Initial Nutrition Assessment  DOCUMENTATION CODES:   Non-severe (moderate) malnutrition in context of chronic illness  INTERVENTION:    Ensure Enlive po TID, each supplement provides 350 kcal and 20 grams of protein  Provide MVI daily  NUTRITION DIAGNOSIS:   Moderate Malnutrition related to chronic illness (CHF/CKD) as evidenced by moderate fat depletion, severe muscle depletion.  GOAL:   Patient will meet greater than or equal to 90% of their needs  MONITOR:   PO intake, Supplement acceptance, Weight trends, Labs, I & O's  REASON FOR ASSESSMENT:   Malnutrition Screening Tool    ASSESSMENT:   Patient with PMH significant for CAD s/p CABG, s/p MVR, s/p TAVR, CHF, CKD III, essential HTN, and HLD. Presents this admission with R upper lobe lung nodule.   7/28- R lung wedge resection  Pt very hard of hearing making it difficult to obtain detailed history. Endorses a slight increase in appetite over the last couple of days but was able to maintain eating three meals (which is his normal). Drinks one Ensure daily. Unable to elaborate any further. RD to continue Ensure to maximize kcal/protien to promote post- op healing.   Reports a UBW of 168 lb and is unsure of recent wt loss. Records indicate pt weighed 180 lb on 1/20 and 166 lb this admission. Suspect dry wt loss but unable to differentiate between fluid fluctuations given CHF hx.   Medications: dulcolax, SS novolog Labs: CBG 104-220  NUTRITION - FOCUSED PHYSICAL EXAM:    Most Recent Value  Orbital Region Mild depletion  Upper Arm Region Severe depletion  Thoracic and Lumbar Region Unable to assess  Buccal Region Moderate depletion  Temple Region Moderate depletion  Clavicle Bone Region Severe depletion  Clavicle and Acromion Bone Region Severe depletion  Scapular Bone Region Unable to assess  Dorsal Hand Moderate depletion  Patellar Region Severe depletion  Anterior Thigh Region Severe depletion  Posterior Calf  Region Severe depletion  Edema (RD Assessment) Moderate  Hair Reviewed  Eyes Reviewed  Mouth Reviewed  Skin Reviewed  Nails Reviewed     Diet Order:   Diet Order            Diet Heart Room service appropriate? Yes; Fluid consistency: Thin  Diet effective now                 EDUCATION NEEDS:   Education needs have been addressed  Skin:  Skin Assessment: Skin Integrity Issues: Skin Integrity Issues:: Incisions Incisions: R chest  Last BM:  7/27  Height:   Ht Readings from Last 1 Encounters:  10/14/19 5\' 8"  (1.727 m)    Weight:   Wt Readings from Last 1 Encounters:  10/15/19 76.6 kg    BMI:  Body mass index is 25.68 kg/m.  Estimated Nutritional Needs:   Kcal:  2300-2500 kcal  Protein:  115-130 grams  Fluid:  >/= 2.3 L/day   Mariana Single RD, LDN Clinical Nutrition Pager listed in [place]

## 2019-10-15 NOTE — Progress Notes (Signed)
Diagnosis n/a for CR services. Yves Dill CES, ACSM 9:46 AM 10/15/2019

## 2019-10-15 NOTE — Discharge Summary (Signed)
Physician Discharge Summary  Patient ID: [name] MRN: [id number] DOB/AGE: 05-06-1931 86 y.o.  Admit date: 10/14/2019 Discharge date: 10/18/2019  Admission Diagnoses: Right upper lobe lung nodule  Discharge Diagnoses:  Active Problems:   Lung nodule  Patient Active Problem List   Diagnosis Date Noted  . Lung nodule 09/02/2019  . Diverticulosis of colon 09/02/2019  . Malnutrition of moderate degree 05/28/2019  . S/P TAVR (transcatheter aortic valve replacement) 05/26/2019  . Thrombocytopenia ([place])   . Sleep apnea 12/2018  . ILD (interstitial lung disease) ([place]) 05/25/2016  . External hemorrhoids 08/02/2015  . Severe aortic stenosis 07/08/2015  . Carotid artery stenosis 11/22/2014  . Chronic anticoagulation 04/13/2014  . Chronic kidney disease (CKD), stage III (moderate) 03/02/2014  . Coronary artery disease involving native coronary artery of native heart without angina pectoris 03/02/2014  . Permanent atrial fibrillation 02/07/2014  . Acute on chronic combined systolic and diastolic CHF (congestive heart failure) ([place]) 02/07/2014  . Dyslipidemia 02/07/2014  . Generalized weakness 02/07/2014  . H/O mitral valve repair 09/16/2002  . S/P CABG x 1 09/16/2002   History of Present Illness:    [name] 86 y.o. male is seen in the office  today for consideration of wedge resection of right lung lesion referred by Dr. Lamonte Sakai.  The patient is known to me from 2004 when he underwent coronary artery bypass grafting and mitral valve repair.  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  More recently developed increasing signs and symptoms of congestive heart failure, low cardiac output and poor LV function.  In March 2021 because of progressive aortic stenosis he underwent placement of percutaneous aortic valve-TAVR.  His wife notes that since his TAVR valve his functional status has markedly  improved-he had gone from very limited mobility due to shortness of breath to near normal activities.  Echocardiogram done after the TAVR April 2021 still noted ejection fraction less than 20% mild to moderate mitral regurgitation.    Patient has a distant history of smoking but quit in 1957  Patient had been referred by Dr. Radford Pax from cardiology to Dr. Chase Caller for abnormal pulmonary function testing-he had been started on amnio several years ago-in 2017 pulmonary functions were normal except mild restriction and total lung capacity and slight reduction in his DLCO-previous CT scan suggested possible ILD versus atelectasis.  Dr. Chase Caller noted undifferentiated interstitial lung disease stable since 2015  CT scan done by Dr. Chase Caller May 2021  Peripheral right upper lobe 10 x 5 mm solid pulmonary nodule is slightly increased from 9 x 5 mm on 05/13/2019 CT and is clearly increased from 5 x 3 mm on 12/09/2018 CT. Primary bronchogenic carcinoma not excluded. Consider PET-CT for further evaluation versus continued close chest CT surveillance in 3 months   Discharged Condition: good  Hospital Course: The patient was admitted electively and on 10/14/2019 he was taken to the operating room where he underwent the below described procedure.  This also included bronchoscopy and ENB by Dr. Valeta Harms.  He tolerated it well and was taken to the postanesthesia care unit in stable condition.  Postoperative hospital course:  Patient has done well.  He has maintained his chronic rate controlled atrial fibrillation.  Oxygen was weaned and he maintains good saturations on room air.  The chest x-ray on postop day 1 showed no definite pneumothorax.  He does have chronic stage III  kidney disease and his creatinine has remained stable.  He does have a minor expected acute blood loss anemia which is stable.  The chest tube was removed on postop day 2.  Final pathology is listed below. Today, he is tolerating room  air, his incisions are healing well, final chest xray showed stable appearance of tiny apical space, and he is cleared for discharge home.   Component 2 d ago  SURGICAL PATHOLOGY SURGICAL PATHOLOGY  CASE: [phone]  PATIENT: [facility]  Surgical Pathology Report      Clinical History: Right upper lobe pulmonary nodule (cm)      FINAL MICROSCOPIC DIAGNOSIS:   A. LUNG, RIGHT UPPER LOBE, WEDGE RESECTION:  - Necrotizing granulomata.   COMMENT:   Special stains AFB (acid-fast bacillus) and GMS (fungus) will be  reported in an addendum.   INTRAOPERATIVE DIAGNOSIS:   A. Lung, right upper lobe, wedge resection: "Caseating granulomatous  inflammation."  Intraoperative diagnosis rendered by Dr. Melina Copa at 10:34 AM.   GROSS DESCRIPTION:   Received fresh for intraoperative consult is a 3.5 x 2 x 1.5 cm, 5 g,  wedge resection of tan lung parenchyma, received incised through a 1 x  0.6 x 0.6 cm tan firm lesion. The lesion is 0.2 cm to the closest  stapled margin. A perpendicular section of the lesion to the margin is  submitted for frozen section, and subsequently submitted in block A1. A  portion of the lesion is collected sterilely for microbiology. The  remaining tissue is submitted in block A2-3. (AK 10/14/2019)     Final Diagnosis performed by Gillie Manners, MD.  Electronically  signed 10/15/2019  Technical and / or Professional components performed at [facility]. [facility], [place] [address], [place], [place] [address].  Immunohistochemistry Technical component (if applicable) was performed  at [facility]. [address] [place],  [place], [place] [address].  IMMUNOHISTOCHEMISTRY DISCLAIMER (if applicable):  Some of these immunohistochemical stains may have been developed and the  performance characteristics determine by [facility]. Some  may not have been cleared or approved by the U.S. Food and Drug   Administration. The FDA has determined that such clearance or approval  is not necessary. This test is used for clinical purposes. It should not  be regarded as investigational or for research. This laboratory is  certified under the [place] [facility] Improvement Amendments [phone]  [facility]) as qualified to perform high complexity clinical laboratory  testing. The controls stained appropriately.      Consults: None  Significant Diagnostic Studies: routine serial CXR's and post op labs  Treatments: surgery:   Video Bronchoscopy with Electromagnetic Navigation Procedure Note  Date of Operation: 10/14/2019  Pre-op Diagnosis: Lung nodule   Post-op Diagnosis: Lung nodule   Surgeon: Garner Nash, DO   Assistants: None   Anesthesia: General endotracheal anesthesia  Operation: Flexible video fiberoptic bronchoscopy with electromagnetic navigation and biopsies.  Estimated Blood Loss: Minimal  Complications: None    PATIENT:  [name]  86 y.o. male  PRE-OPERATIVE DIAGNOSIS:  RUL PULMONARY NODULE  POST-OPERATIVE DIAGNOSIS:  RUL PULMONARY NODULE-granuloma no evidence of maligancy  PROCEDURE:  Procedure(s): XI ROBOTIC ASSISTED THORASCOPY-RIGHT UPPER LOBE LUNG WEDGE RESECTION (Right) INTERCOSTAL NERVE BLOCK (Right)  SURGEON:  Surgeon(s) and Role:    * Grace Isaac, MD - Primary  PHYSICIAN ASSISTANT:   ASSISTANTS: Nicholes Rough PA  ANESTHESIA:   general  EBL:  50 mL   Discharge Exam: Blood pressure Marland Kitchen)  130/81, pulse 101, temperature 98.3 F (36.8 C), temperature source Oral, resp. rate 18, height 5\' 8"  (1.727 m), weight 75.7 kg, SpO2 98 %.   General appearance: alert, cooperative and no distress Heart: irregularly irregular rhythm Lungs: clear to auscultation bilaterally Abdomen: soft, non-tender; bowel sounds normal; no masses,  no organomegaly Extremities: extremities normal, atraumatic, no cyanosis or edema Wound: clean and  day   Discharge disposition: 01-Home or Self Care  Allergies as of 10/17/2019      Reactions   Asa [aspirin] Other (See Comments)   Bleeding- has internal hemorrhoids   Flomax [tamsulosin] Palpitations, Other (See Comments)   Made his heart go out of rhythm   Pirfenidone    Weight loss, loss of appetite.      Medication List    STOP taking these medications   mirtazapine 15 MG tablet Commonly known as: REMERON     TAKE these medications   acetaminophen 500 MG tablet Commonly known as: TYLENOL Take 2 tablets (1,000 mg total) by mouth every 6 (six) hours as needed.   apixaban 2.5 MG Tabs tablet Commonly known as: ELIQUIS Take 1 tablet (2.5 mg total) by mouth 2 (two) times daily. What changed:   medication strength  Another medication with the same name was removed. Continue taking this medication, and follow the directions you see here.   docusate sodium 100 MG capsule Commonly known as: COLACE Take 100 mg by mouth daily as needed (constipation.).   feeding supplement (ENSURE ENLIVE) Liqd Take 237 mLs by mouth 2 (two) times daily between meals.   levothyroxine 75 MCG tablet Commonly known as: SYNTHROID Take 75 mcg by mouth daily before breakfast.   metoprolol succinate 50 MG 24 hr tablet Commonly known as: TOPROL-XL TAKE ONE TABLET BY MOUTH ONE TIME DAILY   potassium chloride SA 20 MEQ tablet Commonly known as: KLOR-CON TAKE ONE TABLET BY MOUTH TWICE DAILY   pravastatin 80 MG tablet Commonly known as: PRAVACHOL Take 80 mg by mouth every Monday, Wednesday, and Friday.   SYSTANE OP Place 1 drop into both eyes 3 (three) times daily as needed (dry/irritated eyes.).   torsemide 20 MG tablet Commonly known as: DEMADEX Take 20-40 mg by mouth See admin instructions. Take 2 tablets (40 mg) by mouth in the morning & take 1 tablet (20 mg) by mouth at night.   traMADol 50 MG tablet Commonly known as: ULTRAM Take 1-2 tablets (50-100 mg total) by mouth every 6 (six)  hours as needed (mild pain).   vitamin B-12 1000 MCG tablet Commonly known as: CYANOCOBALAMIN Take 1,000 mcg by mouth daily.   Vitamin D-3 25 MCG (1000 UT) Caps Take 1,000 Units by mouth daily.       Follow-up Information    Grace Isaac, MD Follow up.   Specialty: Cardiothoracic Surgery Why: Please see discharge paperwork for follow-up appointment with surgeon as well as for suture removal from chest tube site.  Please obtain a chest x-ray at [facility] 1/2-hour prior to the appointment with Dr. Servando Snare.  It is in same office bldg Contact information: [address] [place] [address] [phone]               Signed:  Note By: Nicholes Rough PA-C  Updated by  Ellwood Handler, PA-C  10/18/2019, 7:53 AM

## 2019-10-15 NOTE — Discharge Instructions (Signed)
[facility] office [phone]  Robot-Assisted Thoracic Surgery, Care After This sheet gives you information about how to care for yourself after your procedure. Your health care provider may also give you more specific instructions. If you have problems or questions, contact your health care provider. What can I expect after the procedure? After the procedure, it is common to have:  Some pain and aches in the area of your surgical cuts (incisions).  Pain when breathing in (inhaling) and coughing.  Tiredness (fatigue).  Trouble sleeping.  Constipation. Follow these instructions at home: Medicines  Take over-the-counter and prescription medicines only as told by your health care provider.  If you were prescribed an antibiotic medicine, take it as told by your health care provider. Do not stop taking the antibiotic even if you start to feel better.  Talk with your health care provider about safe and effective ways to manage pain after your procedure. Pain management should fit your specific health needs.  Take prescription pain medicine before pain becomes severe. Relieving and controlling your pain will make breathing easier for you. Activity  Return to your normal activities as told by your health care provider. Ask your health care provider what activities are safe for you.  Do not lift anything that is heavier than 10 lb (4.5 kg), or the limit that you are told, until your health care provider says that it is safe.  Avoid sitting for a long time without moving. Get up and move around one or more times every few hours. Bathing  Do not take baths, swim, or use a hot tub until your health care provider approves. You may take showers. Incision care  Follow instructions from your health care provider about how to take care of your incision(s). Make sure you: ? Wash your hands with soap and water before you change your bandage (dressing). If soap and water are not available, use hand  sanitizer. ? Change your dressing as told by your health care provider. ? Leave stitches (sutures), skin glue, or adhesive strips in place. These skin closures may need to stay in place for 2 weeks or longer. If adhesive strip edges start to loosen and curl up, you may trim the loose edges. Do not remove adhesive strips completely unless your health care provider tells you to do that.  Check your incision area every day for signs of infection. Check for: ? Redness, swelling, or pain. ? Fluid or blood. ? Warmth. ? Pus or a bad smell. Driving  Ask your health care provider when it is safe for you to drive.  Do not drive or use heavy machinery while taking prescription pain medicine. Eating and drinking  Follow instructions from your health care provider about eating or drinking restrictions. These will vary depending on what procedure you had. Your health care provider may recommend: ? A liquid diet or soft diet for the first few days. ? Meals that are smaller and more frequent. ? A diet of fruits, vegetables, whole grains, and low-fat proteins. ? Limiting foods that are high in processed sugar and fat, including fried and sweet foods. Pneumonia prevention   Do not use any products that contain nicotine or tobacco, such as cigarettes and e-cigarettes. If you need help quitting, ask your health care provider.  Avoid secondhand smoke.  Do deep breathing exercises and cough regularly as directed. This helps to clear mucus and prevent pneumonia. If it hurts to cough, try one of these methods to ease your pain when you  cough: ? Hold a pillow against your chest. ? Place the palms of both hands over your incisions (use splinting).  Use an incentive spirometer as directed. This device measures how much air your lungs are getting with each breath. Using this will improve your breathing.  Do pulmonary rehabilitation as directed. This is a program that includes exercise, education, and  support. General instructions  Wear compression stockings as told by your health care provider. These stockings help to prevent blood clots and reduce swelling in your legs.  If you have a drainage tube: ? Follow instructions from your health care provider about how to take care of it. ? Do not travel by airplane after your tube is removed until your health care provider tells you it is safe.  To prevent or treat constipation while you are taking prescription pain medicine, your health care provider may recommend that you: ? Drink enough fluid to keep your urine pale yellow. ? Take over-the-counter or prescription medicines. ? Eat foods that are high in fiber, such as fresh fruits and vegetables, whole grains, and beans. ? Limit foods that are high in fat and processed sugars, such as fried and sweet foods.  Keep all follow-up visits as told by your health care provider. This is important. Contact a health care provider if:  You have redness, swelling, or pain around an incision.  You have fluid or blood coming from an incision.  An incision feels warm to the touch.  You have pus or a bad smell coming from an incision.  You have a fever.  You cannot eat or drink without vomiting.  Your prescription pain medicine is not controlling your pain. Get help right away if:  You have chest pain.  Your heart is beating quickly.  You have trouble breathing.  You have trouble speaking.  You are confused.  You feel weak or dizzy, or you faint. These symptoms may represent a serious problem that is an emergency. Do not wait to see if the symptoms will go away. Get medical help right away. Call your local emergency services (911 in the [place] Do not drive yourself to the hospital. Summary  Talk with your health care provider about safe and effective ways to manage pain after your procedure. Pain management should fit your specific health needs.  Return to your normal activities as  told by your health care provider. Ask your health care provider what activities are safe for you.  Do deep breathing exercises and cough regularly as directed. This helps to clear mucus and prevent pneumonia. If it hurts to cough, ease pain by holding a pillow against your chest or by placing the palms of both hands over your incisions (splinting). This information is not intended to replace advice given to you by your health care provider. Make sure you discuss any questions you have with your health care provider. Document Revised: 01/02/2019 Document Reviewed: 07/09/2016 Elsevier Patient Education  [place].

## 2019-10-15 NOTE — Progress Notes (Signed)
Patient ID: [name], male   DOB: 14-Mar-1932, 86 y.o.   MRN: [id number] [facility] Evening Rounds:  Hemodynamically stable in sinus rhythm. sats 100% on RA Ambulating  Waiting on 2C bed.

## 2019-10-15 NOTE — Progress Notes (Addendum)
[facility] DAILY ICU PROGRESS NOTE                   [address]            [place] [address]          [phone]   1 Day Post-Op Procedure(s) (LRB): XI ROBOTIC ASSISTED THORASCOPY-RIGHT UPPER LOBE LUNG WEDGE RESECTION (Right) INTERCOSTAL NERVE BLOCK (Right)  Total Length of Stay:  LOS: 1 day   Subjective: Very HOH. Doing well this morning.   Objective: Vital signs in last 24 hours: Temp:  [96.8 F (36 C)-97.7 F (36.5 C)] 97.5 F (36.4 C) (07/28 2324) Pulse Rate:  [55-102] 84 (07/29 0700) Cardiac Rhythm: Atrial fibrillation (07/29 0700) Resp:  [11-28] 17 (07/29 0700) BP: (98-176)/(48-87) 98/65 (07/29 0700) SpO2:  [94 %-100 %] 95 % (07/29 0700) Arterial Line BP: (74-173)/(36-102) 92/36 (07/29 0500) Weight:  [76.6 kg] 76.6 kg (07/29 0600)  Filed Weights   10/14/19 0609 10/15/19 0600  Weight: 75.6 kg 76.6 kg    Weight change: 1.048 kg      Intake/Output from previous day: 07/28 0701 - 07/29 0700 In: 1341.1 [P.O.:75; I.V.:1266.1] Out: 2155 [Urine:1865; Blood:50; Chest Tube:240]  Intake/Output this shift: No intake/output data recorded.  Current Meds: Scheduled Meds: . acetaminophen  1,000 mg Oral Q6H   Or  . acetaminophen (TYLENOL) oral liquid 160 mg/5 mL  1,000 mg Oral Q6H  . bisacodyl  10 mg Oral Daily  . bupivacaine liposome  20 mL Infiltration Once  . Chlorhexidine Gluconate Cloth  6 each Topical Daily  . enoxaparin (LOVENOX) injection  30 mg Subcutaneous Daily  . insulin aspart  0-24 Units Subcutaneous TID AC & HS  . levothyroxine  75 mcg Oral QAC breakfast  . potassium chloride  20 mEq Oral Q4H  . [START ON 10/16/2019] pravastatin  80 mg Oral Q M,W,F  . senna-docusate  1 tablet Oral QHS  . sodium chloride flush  10-40 mL Intracatheter Q12H   Continuous Infusions: . sodium chloride 10 mL/hr at 10/14/19 2219   PRN Meds:.Place/Maintain arterial line **AND** sodium chloride, ondansetron (ZOFRAN) IV, sodium chloride flush,  traMADol  General appearance: alert, cooperative and no distress Heart: irregularly irregular rhythm Lungs: clear to auscultation bilaterally Abdomen: soft, non-tender; bowel sounds normal; no masses,  no organomegaly Extremities: extremities normal, atraumatic, no cyanosis or edema Wound: clean and dry dressed with a sterile dressing  Lab Results: CBC: Recent Labs    10/12/19 1349 10/12/19 1349 10/14/19 0557 10/15/19 0432 10/15/19 0528  WBC 7.0  --   --  11.2*  --   HGB 14.0   < >  --  12.4* 12.2*  HCT 43.5   < >  --  37.7* 36.0*  PLT PLATELET CLUMPS NOTED ON SMEAR, COUNT APPEARS DECREASED   < > 91* 94*  --    < > = values in this interval not displayed.   BMET:  Recent Labs    10/12/19 1349 10/12/19 1349 10/15/19 0432 10/15/19 0528  NA 139   < > 142 144  K 3.5   < > 3.4* 3.6  CL 103  --  107  --   CO2 22  --  24  --   GLUCOSE 97  --  118*  --   BUN 55*  --  47*  --   CREATININE 2.32*  --  2.12*  --   CALCIUM 9.5  --  9.0  --    < > =  values in this interval not displayed.    CMET: Lab Results  Component Value Date   WBC 11.2 (H) 10/15/2019   HGB 12.2 (L) 10/15/2019   HCT 36.0 (L) 10/15/2019   PLT 94 (L) 10/15/2019   GLUCOSE 118 (H) 10/15/2019   CHOL 193 06/17/2017   TRIG 114 06/17/2017   HDL 35 (L) 06/17/2017   LDLCALC 135 (H) 06/17/2017   ALT 17 10/12/2019   AST 23 10/12/2019   NA 144 10/15/2019   K 3.6 10/15/2019   CL 107 10/15/2019   CREATININE 2.12 (H) 10/15/2019   BUN 47 (H) 10/15/2019   CO2 24 10/15/2019   TSH 3.558 12/24/2017   INR 1.2 10/12/2019   HGBA1C 5.5 05/22/2019      PT/INR:  Recent Labs    10/12/19 1349  LABPROT 14.3  INR 1.2   Radiology: [facility] Chest Port 1 View  Result Date: 10/14/2019 CLINICAL DATA:  Status post bronchoscopy and wedge resection. EXAM: PORTABLE CHEST 1 VIEW COMPARISON:  October 12, 2019. FINDINGS: Stable cardiomediastinal silhouette. Status post transcatheter aortic valve repair. Right internal jugular catheter  is noted with distal tip in expected position of the SVC. Right-sided chest tube is noted without definite pneumothorax. Ill-defined right upper lobe opacity is noted which may represent contusion or other postoperative change. Minimal bibasilar subsegmental atelectasis is noted. Bony thorax is unremarkable. IMPRESSION: Right-sided chest tube is noted without definite pneumothorax. Ill-defined right upper lobe opacity is noted which may represent contusion or other postoperative change. Minimal bibasilar subsegmental atelectasis is noted. Electronically Signed   By: Marijo Conception M.D.   On: 10/14/2019 12:01   DG C-ARM BRONCHOSCOPY  Result Date: 10/14/2019 C-ARM BRONCHOSCOPY: Fluoroscopy was utilized by the requesting physician.  No radiographic interpretation.     Assessment/Plan: S/P Procedure(s) (LRB): XI ROBOTIC ASSISTED THORASCOPY-RIGHT UPPER LOBE LUNG WEDGE RESECTION (Right) INTERCOSTAL NERVE BLOCK (Right)  1. CV-Chronic rate-controlled afib in the 80s. BP well controlled.  2. Pulm-tolerating room air, CXR this morning shows: Right-sided chest tube is noted without definite pneumothorax. Ill-defined right upper lobe opacity is noted which may represent contusion or other postoperative change. Minimal bibasilar subsegmental atelectasis is noted. 260ml/since surgery out of the chest tube. On suction.  3. Renal-CKD stage III, creatinine 2.12, no Toradol. Weight is up 2lbs. Will start gentle diuresis once BP a little higher. 4. H and H 12.2/36.0, stable.  5. Endo-blood glucose has been well controlled.  6. Continue lovenox, reduced dose 30mg  daily  Plan: Continue incentive spirometer hourly. OOB to chair, Continue to ambulate in the halls. Might be able to discontinue chest tube today.     Elgie Collard 10/15/2019 8:03 AM  Chronic afib- will resume noac tomorrow  renal function stable  I have seen and examined [name] and agree with the above assessment  and plan.  Grace Isaac MD Beeper [phone] Office [phone] 10/15/2019 10:28 AM

## 2019-10-16 ENCOUNTER — Inpatient Hospital Stay (HOSPITAL_COMMUNITY): Payer: PPO

## 2019-10-16 LAB — COMPREHENSIVE METABOLIC PANEL
ALT: 11 U/L (ref 0–44)
AST: 22 U/L (ref 15–41)
Albumin: 3.3 g/dL — ABNORMAL LOW (ref 3.5–5.0)
Alkaline Phosphatase: 58 U/L (ref 38–126)
Anion gap: 10 (ref 5–15)
BUN: 48 mg/dL — ABNORMAL HIGH (ref 8–23)
CO2: 27 mmol/L (ref 22–32)
Calcium: 9.5 mg/dL (ref 8.9–10.3)
Chloride: 106 mmol/L (ref 98–111)
Creatinine, Ser: 1.97 mg/dL — ABNORMAL HIGH (ref 0.61–1.24)
GFR calc Af Amer: 35 mL/min — ABNORMAL LOW (ref >60)
GFR calc non Af Amer: 30 mL/min — ABNORMAL LOW (ref >60)
Glucose, Bld: 115 mg/dL — ABNORMAL HIGH (ref 70–99)
Potassium: 4.5 mmol/L (ref 3.5–5.1)
Sodium: 143 mmol/L (ref 135–145)
Total Bilirubin: 0.9 mg/dL (ref 0.3–1.2)
Total Protein: 6.7 g/dL (ref 6.5–8.1)

## 2019-10-16 LAB — GLUCOSE, CAPILLARY
Glucose-Capillary: 106 mg/dL — ABNORMAL HIGH (ref 70–99)
Glucose-Capillary: 146 mg/dL — ABNORMAL HIGH (ref 70–99)
Glucose-Capillary: 146 mg/dL — ABNORMAL HIGH (ref 70–99)
Glucose-Capillary: 130 mg/dL — ABNORMAL HIGH (ref 70–99)

## 2019-10-16 LAB — CBC
HCT: 37.8 % — ABNORMAL LOW (ref 39.0–52.0)
Hemoglobin: 12.5 g/dL — ABNORMAL LOW (ref 13.0–17.0)
MCH: 30.6 pg (ref 26.0–34.0)
MCHC: 33.1 g/dL (ref 30.0–36.0)
MCV: 92.4 fL (ref 80.0–100.0)
Platelets: 85 10*3/uL — ABNORMAL LOW (ref 150–400)
RBC: 4.09 MIL/uL — ABNORMAL LOW (ref 4.22–5.81)
RDW: 13.2 % (ref 11.5–15.5)
WBC: 9.5 10*3/uL (ref 4.0–10.5)
nRBC: 0 % (ref 0.0–0.2)

## 2019-10-16 MED ORDER — TORSEMIDE 20 MG PO TABS
40.0000 mg | ORAL_TABLET | Freq: Every day | ORAL | Status: DC
  Administered 2019-10-16 – 2019-10-17 (×2): 40 mg via ORAL
  Filled 2019-10-16 (×2): qty 2

## 2019-10-16 MED ORDER — METOPROLOL SUCCINATE ER 50 MG PO TB24
50.0000 mg | ORAL_TABLET | Freq: Every day | ORAL | Status: DC
  Administered 2019-10-16 – 2019-10-17 (×2): 50 mg via ORAL
  Filled 2019-10-16 (×2): qty 1

## 2019-10-16 MED ORDER — TORSEMIDE 20 MG PO TABS: 20.0000 mg | ORAL_TABLET | ORAL | Status: DC

## 2019-10-16 MED ORDER — APIXABAN 2.5 MG PO TABS
2.5000 mg | ORAL_TABLET | Freq: Two times a day (BID) | ORAL | Status: DC
  Administered 2019-10-16 – 2019-10-17 (×3): 2.5 mg via ORAL
  Filled 2019-10-16 (×3): qty 1

## 2019-10-16 MED ORDER — TORSEMIDE 20 MG PO TABS
20.0000 mg | ORAL_TABLET | Freq: Every day | ORAL | Status: DC
  Administered 2019-10-16: 20 mg via ORAL
  Filled 2019-10-16: qty 1

## 2019-10-16 NOTE — Op Note (Signed)
NAME: [name], OLSHEFSKI MEDICAL RECORD FY:[id number] ACCOUNT [id number] DATE OF BIRTH:April 28, 1931 FACILITY: [facility] LOCATION: [facility] PHYSICIAN:Tarisa Paola Maryruth Bun, MD  OPERATIVE REPORT  DATE OF PROCEDURE:  10/14/2019  PREOPERATIVE DIAGNOSIS:  Right upper lobe lung nodule.  POSTOPERATIVE DIAGNOSIS:  Right upper lobe lung nodule.  Noncaseating granuloma.  PROCEDURE PERFORMED:  Right robotic video-assisted thoracoscopy with wedge resection of right upper lobe and intercostal nerve block.  SURGEON:  Lanelle Bal, MD  FIRST ASSISTANT:  Nicholes Rough, PA  BRIEF HISTORY:  The patient is an 86 year old male with a long medical history including coronary artery bypass grafts and mitral valve repair 17 years ago.  In the early part of 2021 he presented with low gradient aortic stenosis with significant heart  failure symptoms.  He underwent evaluation for TAVR.  During this evaluation, a CT scan showed a just under 1 cm peripheral nodule in the right upper lobe.  He was being evaluated by the pulmonary service and referred for consideration for resection.   With the patient's significant improvement in his overall functional status following the TAVR and adequate pulmonary function studies, we discussed with him wedge resection of the mass, both for diagnostic and therapeutic reasons.  The patient agreed  and signed informed consent.  Preoperatively, the right side was marked.  The patient, on the day of surgery, was initially in the endo suite and a navigational bronchoscopy with placement of ICG and methylene blue in the vicinity of the mass by Dr.  Valeta Harms was done.  Immediately following this, the patient was brought to the OR #10.  A single lumen endotracheal tube was replaced for a double-lumen tube.  He was then turned in the lateral decubitus position with the right side up.  The right chest was  prepped with Betadine, draped in the usual sterile manner.  The right side had preoperatively been marked.   Appropriate timeout was performed and then we proceeded with placement of ports for robotic-assisted wedge resection.  Approximately the 8th  intercostal space anterior axillary line after instillation of Exparel, a small incision was made using a 0-degree 5 mm scope through an 8 mm Optiview port.  We entered into the right chest.  Mild insufflation was started.  We then placed anterior a 12  mm port along the same intercostal space.  4 cm posteriorly a 2nd 12 mm port was posterior 8 mm port.  With the ports in place and adequate deflation of the lung the da Vinci robot was then positioned and targeting of the setup was then performed after  engaging the camera arm.  The remaining arms were docked.  A tip-up forceps Cadiere bipolar grasper was then placed through the port sites.  We then moved to the robotic console and with good visualization and with almost complete fissures the right  upper lobe was easily visualized.  Posteriorly in the right upper lobe a methylene blue stain on the surface of the lung was easily demonstrated and confirmed with widening of the field through the robot with ____.  With the area of where the lesion  noted on CT scan was located we then using blue load 40 mm staplers, a wedge resection of the right upper lobe was done.  This took several successive firings.  We then placed the specimen in a small specimen bag and brought it out through the accessory  port that was positioned lower in the right chest.  On the operative field the specimen was opened and confirmed that the  mass noted on CT was present.  This was marked and sent to pathology for frozen section.  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  Further resection was not felt warranted and we then moved to undock the robot with a handheld camera.  A mixture of Exparel, saline, Nupercaine bupivacaine were then injected to 10 mL along the posterior intercostal spaces for  postoperative pain control.   The 3 posterior placed ports were then removed as well as the accessory port.  A 28 Blake chest tube was placed through the camera port and the most anterior port was also closed with UR-6 suture and a subcuticular stitch.   The lung reinflated well.  Chest tube was secured in place.  Sponge and needle count was reported as correct at completion of procedure.  Blood loss was minimal.  Dermabond was placed.  Drapes were removed and the patient was awakened and extubated in  the operating room.  He was then transferred to the recovery room for postoperative observation, having tolerated the procedure without obvious complication.  CN/NUANCE  D:10/15/2019 T:10/16/2019 JOB:[id number]

## 2019-10-16 NOTE — Progress Notes (Addendum)
[address]       [place] [address]             [phone]      2 Days Post-Op Procedure(s) (LRB): XI ROBOTIC ASSISTED THORASCOPY-RIGHT UPPER LOBE LUNG WEDGE RESECTION (Right) INTERCOSTAL NERVE BLOCK (Right) Subjective: Feels okay this morning.   Objective: Vital signs in last 24 hours: Temp:  [97.7 F (36.5 C)-99 F (37.2 C)] 97.9 F (36.6 C) (07/30 0400) Pulse Rate:  [27-117] 92 (07/30 0600) Cardiac Rhythm: Atrial fibrillation (07/30 0400) Resp:  [14-26] 15 (07/30 0600) BP: (102-153)/(49-99) 135/72 (07/30 0600) SpO2:  [93 %-99 %] 97 % (07/30 0600) Weight:  [78.1 kg-78.2 kg] 78.1 kg (07/30 0654)     Intake/Output from previous day: 07/29 0701 - 07/30 0700 In: 500 [P.O.:500] Out: 1080 [Urine:950; Chest Tube:130] Intake/Output this shift: No intake/output data recorded.  General appearance: alert, cooperative and no distress Heart: irregularly irregular rhythm Lungs: clear to auscultation bilaterally Abdomen: soft, non-tender; bowel sounds normal; no masses,  no organomegaly Extremities: extremities normal, atraumatic, no cyanosis or edema Wound: clean and dry  Lab Results: Recent Labs    10/15/19 0432 10/15/19 0432 10/15/19 0528 10/16/19 0111  WBC 11.2*  --   --  9.5  HGB 12.4*   < > 12.2* 12.5*  HCT 37.7*   < > 36.0* 37.8*  PLT 94*  --   --  85*   < > = values in this interval not displayed.   BMET:  Recent Labs    10/15/19 0432 10/15/19 0432 10/15/19 0528 10/16/19 0111  NA 142   < > 144 143  K 3.4*   < > 3.6 4.5  CL 107  --   --  106  CO2 24  --   --  27  GLUCOSE 118*  --   --  115*  BUN 47*  --   --  48*  CREATININE 2.12*  --   --  1.97*  CALCIUM 9.0  --   --  9.5   < > = values in this interval not displayed.    PT/INR: No results for input(s): LABPROT, INR in the last 72 hours. ABG    Component Value Date/Time   PHART 7.480 (H) 10/15/2019 0528   HCO3 27.7 10/15/2019 0528   TCO2 29 10/15/2019 0528   O2SAT  98.0 10/15/2019 0528   CBG (last 3)  Recent Labs    10/15/19 1532 10/15/19 2140 10/16/19 0655  GLUCAP 79 145* 106*    Assessment/Plan: S/P Procedure(s) (LRB): XI ROBOTIC ASSISTED THORASCOPY-RIGHT UPPER LOBE LUNG WEDGE RESECTION (Right) INTERCOSTAL NERVE BLOCK (Right)  1. CV-Chronic rate-controlled afib in the 80s. BP well controlled.  2. Pulm-tolerating room air, CXR this morning shows:Persistent airspace opacity right mid lung, likely due at least in part to postoperative hemorrhage. Small left pleural effusion with left base atelectasis. Lungs elsewhere clear. No pneumothorax. Stable cardiac prominence with postoperative changes noted. 141ml/since surgery out of the chest tube. On suction.  3. Renal-CKD stage III, creatinine 1.97, no Toradol. Weight is up 3lbs. On Demadex.  4. H and H 12.5/37.8, stable.  5. Endo-blood glucose has been well controlled.  6. Continue lovenox, reduced dose 30mg  daily  Plan: Can likely get his chest tube out today. 2 view in the morning. Ambulate in the halls. Continue to use incentive spirometer. Will restart Eliquis.    LOS: 2 days    Elgie Collard 10/16/2019  Chronic Kidney Disease  Stage I     GFR >90  Stage II    GFR 60-89  Stage IIIA GFR 45-59  Stage IIIB GFR 30-44  Stage IV   GFR 15-29  Stage V    GFR  <15  Lab Results  Component Value Date   CREATININE 1.97 (H) 10/16/2019   Estimated Creatinine Clearance: 26.5 mL/min (A) (by C-G formula based on SCr of 1.97 mg/dL (H)). Stage IIIB CKD Chest tube out this am, follow up chest xray In afib - resume beta blocker this am Resume eliquis this afternoon Poss home in am Waiting bed of 2c I have seen and examined [name] and agree with the above assessment  and plan.  Grace Isaac MD Beeper [phone] Office [phone] 10/16/2019 10:31 AM

## 2019-10-17 ENCOUNTER — Inpatient Hospital Stay (HOSPITAL_COMMUNITY): Payer: PPO

## 2019-10-17 LAB — GLUCOSE, CAPILLARY
Glucose-Capillary: 123 mg/dL — ABNORMAL HIGH (ref 70–99)
Glucose-Capillary: 160 mg/dL — ABNORMAL HIGH (ref 70–99)

## 2019-10-17 MED ORDER — APIXABAN 2.5 MG PO TABS: 2.5000 mg | ORAL_TABLET | Freq: Two times a day (BID) | ORAL | 1 refills | Status: DC

## 2019-10-17 MED ORDER — ACETAMINOPHEN 500 MG PO TABS: 1000.0000 mg | ORAL_TABLET | Freq: Four times a day (QID) | ORAL | 0 refills | Status: DC | PRN

## 2019-10-17 MED ORDER — TRAMADOL HCL 50 MG PO TABS: 50.0000 mg | ORAL_TABLET | Freq: Four times a day (QID) | ORAL | 0 refills | Status: DC | PRN

## 2019-10-17 NOTE — Progress Notes (Addendum)
      [address]       ,[place] [address]             [phone]      3 Days Post-Op Procedure(s) (LRB): XI ROBOTIC ASSISTED THORASCOPY-RIGHT UPPER LOBE LUNG WEDGE RESECTION (Right) INTERCOSTAL NERVE BLOCK (Right)   Subjective:  Patient doing well.  States other than having a chest tube in place he would have never felt like he had surgery.  Objective: Vital signs in last 24 hours: Temp:  [97.8 F (36.6 C)-98.1 F (36.7 C)] 98.1 F (36.7 C) (07/31 0748) Pulse Rate:  [45-121] 90 (07/31 0500) Cardiac Rhythm: Atrial fibrillation (07/31 0700) Resp:  [13-23] 16 (07/31 0500) BP: (100-151)/(53-100) 122/73 (07/31 0300) SpO2:  [94 %-100 %] 95 % (07/31 0500) Weight:  [75.7 kg] 75.7 kg (07/31 0500)  Intake/Output from previous day: 07/30 0701 - 07/31 0700 In: -  Out: 2800 [Urine:2800] Intake/Output this shift: Total I/O In: 240 [P.O.:240] Out: 450 [Urine:450]  General appearance: alert, cooperative and no distress Heart: irregularly irregular rhythm Lungs: clear to auscultation bilaterally Abdomen: soft, non-tender; bowel sounds normal; no masses,  no organomegaly Extremities: extremities normal, atraumatic, no cyanosis or edema Wound: clean and dry  Lab Results: Recent Labs    10/15/19 0432 10/15/19 0432 10/15/19 0528 10/16/19 0111  WBC 11.2*  --   --  9.5  HGB 12.4*   < > 12.2* 12.5*  HCT 37.7*   < > 36.0* 37.8*  PLT 94*  --   --  85*   < > = values in this interval not displayed.   BMET:  Recent Labs    10/15/19 0432 10/15/19 0432 10/15/19 0528 10/16/19 0111  NA 142   < > 144 143  K 3.4*   < > 3.6 4.5  CL 107  --   --  106  CO2 24  --   --  27  GLUCOSE 118*  --   --  115*  BUN 47*  --   --  48*  CREATININE 2.12*  --   --  1.97*  CALCIUM 9.0  --   --  9.5   < > = values in this interval not displayed.    PT/INR: No results for input(s): LABPROT, INR in the last 72 hours. ABG    Component Value Date/Time   PHART 7.480 (H)  10/15/2019 0528   HCO3 27.7 10/15/2019 0528   TCO2 29 10/15/2019 0528   O2SAT 98.0 10/15/2019 0528   CBG (last 3)  Recent Labs    10/16/19 1547 10/16/19 2124 10/17/19 0557  GLUCAP 146* 130* 123*    Assessment/Plan: S/P Procedure(s) (LRB): XI ROBOTIC ASSISTED THORASCOPY-RIGHT UPPER LOBE LUNG WEDGE RESECTION (Right) INTERCOSTAL NERVE BLOCK (Right)  1. CV- Chronic A. Fib, BP stable- on Eliquis 2. Pulm- no acute issues, CXR shows tiny apical pneumothorax, stable post surgical changes 3. Renal- CKD Stage III, stable 4. Dispo- patient stable, will plan to d/c home today   LOS: 3 days    Ellwood Handler, PA-C 10/17/2019   Pt seen and examined; chart/films personally reviewed. Agree with documentation and plan for discharge home. Sultan Pargas Z. Orvan Seen, [place]

## 2019-10-17 NOTE — Care Management (Signed)
Provided w 30 day Eliquis card

## 2019-10-19 LAB — AEROBIC/ANAEROBIC CULTURE W GRAM STAIN (SURGICAL/DEEP WOUND): Culture: NO GROWTH

## 2019-10-21 ENCOUNTER — Other Ambulatory Visit (HOSPITAL_COMMUNITY): Payer: PPO

## 2019-10-21 LAB — SURGICAL PATHOLOGY

## 2019-10-22 ENCOUNTER — Other Ambulatory Visit: Payer: Self-pay

## 2019-10-22 ENCOUNTER — Ambulatory Visit (INDEPENDENT_AMBULATORY_CARE_PROVIDER_SITE_OTHER): Payer: Self-pay

## 2019-10-22 DIAGNOSIS — Z4802 Encounter for removal of sutures: Secondary | ICD-10-CM

## 2019-10-22 NOTE — Progress Notes (Signed)
Patient arrived for nurse visit to remove sutures post- procedure right RATS/ Wedge with Dr. Servando Snare 10/14/19.  Two sutures removed with no signs/ symptoms of infection noted.  Site was reddened around the suture sites.  Incision looked well healed and well approximated.  Added one steri-strip to the middle of the incision to prevent dehiscence.  Patient tolerated procedure well.  Patient/ family instructed to keep the incision sites clean and dry and educated on the signs and symptoms of infection and when to contact the office back. Patient/ family acknowledged instructions given. Patient and wife aware of follow-up appointments, no further questions.

## 2019-10-26 ENCOUNTER — Other Ambulatory Visit: Payer: Self-pay | Admitting: Cardiothoracic Surgery

## 2019-10-26 DIAGNOSIS — Z951 Presence of aortocoronary bypass graft: Secondary | ICD-10-CM

## 2019-10-27 ENCOUNTER — Ambulatory Visit
Admission: RE | Admit: 2019-10-27 | Discharge: 2019-10-27 | Disposition: A | Discharge status: Home / Self Care | Payer: PPO | Source: Ambulatory Visit | Attending: Cardiothoracic Surgery | Admitting: Cardiothoracic Surgery

## 2019-10-27 ENCOUNTER — Ambulatory Visit (INDEPENDENT_AMBULATORY_CARE_PROVIDER_SITE_OTHER): Payer: Self-pay | Admitting: Cardiothoracic Surgery

## 2019-10-27 ENCOUNTER — Other Ambulatory Visit: Payer: Self-pay

## 2019-10-27 VITALS — BP 98/59 | HR 68 | Temp 97.8°F | Resp 20 | Ht 68.0 in | Wt 162.0 lb

## 2019-10-27 DIAGNOSIS — Z951 Presence of aortocoronary bypass graft: Secondary | ICD-10-CM | POA: Diagnosis not present

## 2019-10-27 DIAGNOSIS — Z09 Encounter for follow-up examination after completed treatment for conditions other than malignant neoplasm: Secondary | ICD-10-CM

## 2019-10-27 DIAGNOSIS — R911 Solitary pulmonary nodule: Secondary | ICD-10-CM

## 2019-10-27 DIAGNOSIS — Z952 Presence of prosthetic heart valve: Secondary | ICD-10-CM | POA: Diagnosis not present

## 2019-10-27 DIAGNOSIS — J9811 Atelectasis: Secondary | ICD-10-CM | POA: Diagnosis not present

## 2019-10-27 NOTE — Progress Notes (Signed)
[address]       [place] [address]             [phone]      Nathaniel B. [facility] [place] [facility] Medical Record #[id number] Date of Birth: 1932/01/31  Referring: Collene Gobble, MD Primary Care: [name], MD Primary Cardiologist: Buford Dresser, MD   Chief Complaint:   POST OP FOLLOW UP OPERATIVE REPORT DATE OF PROCEDURE:  10/14/2019 PREOPERATIVE DIAGNOSIS:  Right upper lobe lung nodule. POSTOPERATIVE DIAGNOSIS:  Right upper lobe lung nodule.  Noncaseating granuloma. PROCEDURE PERFORMED:  Right robotic video-assisted thoracoscopy with wedge resection of right upper lobe and intercostal nerve block.  FINAL MICROSCOPIC DIAGNOSIS:   A. LUNG, RIGHT UPPER LOBE, WEDGE RESECTION:  - Necrotizing granulomata.  COMMENT:  Yeast forms are observed within necrotizing granulomata per GMS stain.   Micro smears negative- cultures negative so far  History of Present Illness:      Patient returns to the office today after for postop visit after recent robotic wedge resection of a lung mass-path noted necrotizing granuloma.  Patient notes that his doing well since discharge home, returned to near normal activities, denies any respiratory difficulty.  His only complaint is some discomfort along the anterior ribs on the right lower chest.    Past Medical History:  Diagnosis Date  . Adenomatous colon polyp   . CAD (coronary artery disease)    a. S/P CABG x 1 @ time of MV annuloplasty;  b. 02/2010 ETT: neg for ischemia.  . Carotid artery stenosis    1-39% by dopplers 10/2016  . Chronic combined systolic (congestive) and diastolic (congestive) heart failure ([place])   . CKD (chronic kidney disease), stage III   . Current use of long term anticoagulation    a. Coumadin in setting of afib.  . Diverticulosis   . Dyslipidemia   . Dysrhythmia    A-fib  . Essential hypertension   . H/O mitral valve repair 09/16/2002   Complex valvuloplasty including quadrangular  resection of posterior leaflet with artificial Gore-tex neochords and 28 mm Seguin ring annuloplasty - Dr Servando Snare  . Hemorrhoids   . Hyperlipidemia   . Hypertension   . Permanent atrial fibrillation (HCC)    a. CHA2DS2VASc = 6 -->chronic coumadin;  b. Prev on amio-->d/c 2/2 hypothyroidism. >> resumed 5/16. c. Notes from 2017 indicate interstitial lung disease by pulm appt, question amio toxicity, also increased liver attenuation. Rate control pursued and amiodarone discontinued.  . S/P CABG x 1 09/16/2002   SVG to OM  . 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 Sleep apnea 12/2018   per patient's spouse wears BIPAP "every other night or so"  . Thrombocytopenia ([place])   . TIA (transient ischemic attack)      Social History   Tobacco Use  Smoking Status Former Smoker  . Packs/day: 0.10  . Years: 2.00  . Pack years: 0.20  . Types: Cigarettes  . Quit date: 03/20/1955  . Years since quitting: 64.6  Smokeless Tobacco Never Used    Social History   Substance and Sexual Activity  Alcohol Use No  . Alcohol/week: 0.0 standard drinks     Allergies  Allergen Reactions  . Asa [Aspirin] Other (See Comments)    Bleeding- has internal hemorrhoids  . Flomax [Tamsulosin] Palpitations and Other (See Comments)  Made his heart go out of rhythm  . Pirfenidone     Weight loss, loss of appetite.    Current Outpatient Medications  Medication Sig Dispense Refill  . acetaminophen (TYLENOL) 500 MG tablet Take 2 tablets (1,000 mg total) by mouth every 6 (six) hours as needed. 30 tablet 0  . apixaban (ELIQUIS) 2.5 MG TABS tablet Take 1 tablet (2.5 mg total) by mouth 2 (two) times daily. 60 tablet 1  . levothyroxine (SYNTHROID) 75 MCG tablet Take 75 mcg by mouth daily before breakfast.     . metoprolol succinate (TOPROL-XL) 50 MG 24 hr tablet TAKE ONE TABLET BY MOUTH ONE TIME DAILY (Patient  taking differently: Take 50 mg by mouth daily. ) 90 tablet 0  . Polyethyl Glycol-Propyl Glycol (SYSTANE OP) Place 1 drop into both eyes 3 (three) times daily as needed (dry/irritated eyes.).     Marland Kitchen potassium chloride SA (KLOR-CON) 20 MEQ tablet TAKE ONE TABLET BY MOUTH TWICE DAILY 180 tablet 3  . pravastatin (PRAVACHOL) 80 MG tablet Take 80 mg by mouth every Monday, Wednesday, and Friday.     . torsemide (DEMADEX) 20 MG tablet Take 20-40 mg by mouth See admin instructions. Take 2 tablets (40 mg) by mouth in the morning & take 1 tablet (20 mg) by mouth at night.    . feeding supplement, ENSURE ENLIVE, (ENSURE ENLIVE) LIQD Take 237 mLs by mouth 2 (two) times daily between meals. (Patient not taking: Reported on 10/27/2019) 237 mL 12  . vitamin B-12 (CYANOCOBALAMIN) 1000 MCG tablet Take 1,000 mcg by mouth daily. (Patient not taking: Reported on 10/27/2019)     No current facility-administered medications for this visit.       Physical Exam: BP (!) 98/59   Pulse 68   Temp 97.8 F (36.6 C) (Skin)   Resp 20   Ht 5\' 8"  (1.727 m)   Wt 162 lb (73.5 kg)   SpO2 99% Comment: RA  BMI 24.63 kg/m   General appearance: alert and cooperative Neurologic: intact Heart: regular rate and rhythm, S1, S2 normal, no murmur, click, rub or gallop Lungs: clear to auscultation bilaterally Abdomen: soft, non-tender; bowel sounds normal; no masses,  no organomegaly Extremities: extremities normal, atraumatic, no cyanosis or edema Wound: Chest tube and port sites are all well-healed sutures removed   Diagnostic Studies & Laboratory data:     Recent Radiology Findings:   DG Chest 2 View  Result Date: 10/27/2019 CLINICAL DATA:  Chest pain EXAM: CHEST - 2 VIEW COMPARISON:  October 17, 2019 FINDINGS: There is mild atelectatic change in the right upper lobe. The lungs elsewhere are clear. Heart is upper normal in size with pulmonary vascularity normal. Patient is status post coronary artery bypass grafting as well as  aortic valve replacement. No adenopathy. No pneumothorax. No bone lesions. IMPRESSION: Mild right upper lobe atelectasis. Lungs elsewhere clear. Heart upper normal in size with postoperative changes. No adenopathy evident. Electronically Signed   By: Lowella Grip III M.D.   On: 10/27/2019 14:41   I have independently reviewed the above radiology studies  and reviewed the findings with the patient.    Recent Lab Findings: Lab Results  Component Value Date   WBC 9.5 10/16/2019   HGB 12.5 (L) 10/16/2019   HCT 37.8 (L) 10/16/2019   PLT 85 (L) 10/16/2019   GLUCOSE 115 (H) 10/16/2019   CHOL 193 06/17/2017   TRIG 114 06/17/2017   HDL 35 (L) 06/17/2017   LDLCALC 135 (H) 06/17/2017  ALT 11 10/16/2019   AST 22 10/16/2019   NA 143 10/16/2019   K 4.5 10/16/2019   CL 106 10/16/2019   CREATININE 1.97 (H) 10/16/2019   BUN 48 (H) 10/16/2019   CO2 27 10/16/2019   TSH 3.558 12/24/2017   INR 1.2 10/12/2019   HGBA1C 5.5 05/22/2019      Assessment / Plan:   #1 stable progression after recent robotic assisted wedge resection right upper lobe with-final path -granuloma so far since microbiology stains and cultures have been negative-pathology suggested yeast forms  Plan continue observation-follow-up chest x-ray in 1 month   Medication Changes: No orders of the defined types were placed in this encounter.     Grace Isaac MD      [address] [place] [address] Office [phone]     10/27/2019 5:12 PM

## 2019-11-09 ENCOUNTER — Other Ambulatory Visit: Payer: Self-pay | Admitting: Cardiology

## 2019-11-09 ENCOUNTER — Other Ambulatory Visit: Payer: Self-pay | Admitting: Thoracic Surgery (Cardiothoracic Vascular Surgery)

## 2019-11-09 DIAGNOSIS — I4891 Unspecified atrial fibrillation: Secondary | ICD-10-CM

## 2019-11-09 DIAGNOSIS — R911 Solitary pulmonary nodule: Secondary | ICD-10-CM

## 2019-11-09 DIAGNOSIS — I5042 Chronic combined systolic (congestive) and diastolic (congestive) heart failure: Secondary | ICD-10-CM

## 2019-11-10 DIAGNOSIS — I5022 Chronic systolic (congestive) heart failure: Secondary | ICD-10-CM | POA: Diagnosis not present

## 2019-11-10 DIAGNOSIS — I501 Left ventricular failure: Secondary | ICD-10-CM | POA: Diagnosis not present

## 2019-11-10 DIAGNOSIS — I5030 Unspecified diastolic (congestive) heart failure: Secondary | ICD-10-CM | POA: Diagnosis not present

## 2019-11-10 DIAGNOSIS — N183 Chronic kidney disease, stage 3 unspecified: Secondary | ICD-10-CM | POA: Diagnosis not present

## 2019-11-10 DIAGNOSIS — I480 Paroxysmal atrial fibrillation: Secondary | ICD-10-CM | POA: Diagnosis not present

## 2019-11-10 DIAGNOSIS — E7800 Pure hypercholesterolemia, unspecified: Secondary | ICD-10-CM | POA: Diagnosis not present

## 2019-11-10 DIAGNOSIS — G4700 Insomnia, unspecified: Secondary | ICD-10-CM | POA: Diagnosis not present

## 2019-11-10 DIAGNOSIS — E039 Hypothyroidism, unspecified: Secondary | ICD-10-CM | POA: Diagnosis not present

## 2019-11-10 DIAGNOSIS — I10 Essential (primary) hypertension: Secondary | ICD-10-CM | POA: Diagnosis not present

## 2019-11-10 DIAGNOSIS — I2510 Atherosclerotic heart disease of native coronary artery without angina pectoris: Secondary | ICD-10-CM | POA: Diagnosis not present

## 2019-11-12 LAB — FUNGUS CULTURE WITH STAIN

## 2019-11-12 LAB — FUNGUS CULTURE RESULT

## 2019-11-12 LAB — FUNGAL ORGANISM REFLEX

## 2019-11-17 DIAGNOSIS — I10 Essential (primary) hypertension: Secondary | ICD-10-CM | POA: Diagnosis not present

## 2019-11-27 LAB — ACID FAST CULTURE WITH REFLEXED SENSITIVITIES (MYCOBACTERIA): Acid Fast Culture: NEGATIVE

## 2019-12-02 ENCOUNTER — Other Ambulatory Visit: Payer: Self-pay | Admitting: Cardiothoracic Surgery

## 2019-12-02 DIAGNOSIS — R911 Solitary pulmonary nodule: Secondary | ICD-10-CM

## 2019-12-03 ENCOUNTER — Ambulatory Visit
Admission: RE | Admit: 2019-12-03 | Discharge: 2019-12-03 | Disposition: A | Discharge status: Home / Self Care | Payer: PPO | Source: Ambulatory Visit | Attending: Cardiothoracic Surgery | Admitting: Cardiothoracic Surgery

## 2019-12-03 ENCOUNTER — Other Ambulatory Visit: Payer: Self-pay

## 2019-12-03 ENCOUNTER — Ambulatory Visit (INDEPENDENT_AMBULATORY_CARE_PROVIDER_SITE_OTHER): Payer: Self-pay | Admitting: Cardiothoracic Surgery

## 2019-12-03 VITALS — BP 113/66 | HR 60 | Temp 97.6°F | Resp 20 | Ht 68.0 in | Wt 160.0 lb

## 2019-12-03 DIAGNOSIS — R911 Solitary pulmonary nodule: Secondary | ICD-10-CM

## 2019-12-03 DIAGNOSIS — Z09 Encounter for follow-up examination after completed treatment for conditions other than malignant neoplasm: Secondary | ICD-10-CM

## 2019-12-03 DIAGNOSIS — Z9889 Other specified postprocedural states: Secondary | ICD-10-CM | POA: Diagnosis not present

## 2019-12-03 NOTE — Progress Notes (Signed)
[address]       ,[place] [address]             [phone]      Elisha B. [facility] Medical Record #[id number] Date of Birth: 07/10/1931  Referring: Collene Gobble, MD Primary Care: [name], MD Primary Cardiologist: Buford Dresser, MD   Chief Complaint:   POST OP FOLLOW UP OPERATIVE REPORT DATE OF PROCEDURE:  10/14/2019 PREOPERATIVE DIAGNOSIS:  Right upper lobe lung nodule. POSTOPERATIVE DIAGNOSIS:  Right upper lobe lung nodule.  Noncaseating granuloma. PROCEDURE PERFORMED:  Right robotic video-assisted thoracoscopy with wedge resection of right upper lobe and intercostal nerve block.  FINAL MICROSCOPIC DIAGNOSIS:   A. LUNG, RIGHT UPPER LOBE, WEDGE RESECTION:  - Necrotizing granulomata.  COMMENT:  Yeast forms are observed within necrotizing granulomata per GMS stain.   Micro smears negative- cultures negative so far  History of Present Illness:     Patient returns to the office today after robotic resection of the necrotizing granuloma 7 mm in size right lung Is made good progress postoperatively especially considering his age of 57 years, previous coronary artery bypass grafting and mitral valve repair 17 years ago and TAVR-aortic valve 6 months ago.  He has been followed for his pulmonary issues by Dr. Chase Caller   Past Medical History:  Diagnosis Date  . Adenomatous colon polyp   . CAD (coronary artery disease)    a. S/P CABG x 1 @ time of MV annuloplasty;  b. 02/2010 ETT: neg for ischemia.  . Carotid artery stenosis    1-39% by dopplers 10/2016  . Chronic combined systolic (congestive) and diastolic (congestive) heart failure ([place])   . CKD (chronic kidney disease), stage III   . Current use of long term anticoagulation    a. Coumadin in setting of afib.  . Diverticulosis   . Dyslipidemia   . Dysrhythmia    A-fib  . Essential hypertension   . H/O mitral valve repair 09/16/2002   Complex valvuloplasty including  quadrangular resection of posterior leaflet with artificial Gore-tex neochords and 28 mm Seguin ring annuloplasty - Dr Servando Snare  . Hemorrhoids   . Hyperlipidemia   . Hypertension   . Permanent atrial fibrillation (HCC)    a. CHA2DS2VASc = 6 -->chronic coumadin;  b. Prev on amio-->d/c 2/2 hypothyroidism. >> resumed 5/16. c. Notes from 2017 indicate interstitial lung disease by pulm appt, question amio toxicity, also increased liver attenuation. Rate control pursued and amiodarone discontinued.  . S/P CABG x 1 09/16/2002   SVG to OM  . 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 Sleep apnea 12/2018   per patient's spouse wears BIPAP "every other night or so"  . Thrombocytopenia ([place])   . TIA (transient ischemic attack)      Social History   Tobacco Use  Smoking Status Former Smoker  . Packs/day: 0.10  . Years: 2.00  . Pack years: 0.20  . Types: Cigarettes  . Quit date: 03/20/1955  . Years since quitting: 64.7  Smokeless Tobacco Never Used    Social History   Substance and Sexual Activity  Alcohol Use No  . Alcohol/week: 0.0 standard drinks     Allergies  Allergen Reactions  . Asa [Aspirin] Other (See Comments)    Bleeding- has internal hemorrhoids  . Flomax [Tamsulosin] Palpitations and Other (See Comments)  Made his heart go out of rhythm  . Pirfenidone     Weight loss, loss of appetite.    Current Outpatient Medications  Medication Sig Dispense Refill  . apixaban (ELIQUIS) 2.5 MG TABS tablet Take 1 tablet (2.5 mg total) by mouth 2 (two) times daily. 60 tablet 1  . feeding supplement, ENSURE ENLIVE, (ENSURE ENLIVE) LIQD Take 237 mLs by mouth 2 (two) times daily between meals. 237 mL 12  . levothyroxine (SYNTHROID) 75 MCG tablet Take 75 mcg by mouth daily before breakfast.     . metoprolol succinate (TOPROL-XL) 50 MG 24 hr tablet TAKE ONE TABLET BY MOUTH ONE TIME  DAILY 90 tablet 3  . Polyethyl Glycol-Propyl Glycol (SYSTANE OP) Place 1 drop into both eyes 3 (three) times daily as needed (dry/irritated eyes.).     Marland Kitchen potassium chloride SA (KLOR-CON) 20 MEQ tablet TAKE ONE TABLET BY MOUTH TWICE DAILY 180 tablet 3  . pravastatin (PRAVACHOL) 80 MG tablet Take 80 mg by mouth every Monday, Wednesday, and Friday.     . torsemide (DEMADEX) 20 MG tablet Take 20-40 mg by mouth See admin instructions. Take 2 tablets (40 mg) by mouth in the morning & take 1 tablet (20 mg) by mouth at night.    . vitamin B-12 (CYANOCOBALAMIN) 1000 MCG tablet Take 1,000 mcg by mouth daily.      No current facility-administered medications for this visit.       Physical Exam: BP 113/66   Pulse 60   Temp 97.6 F (36.4 C) (Skin)   Resp 20   Ht 5\' 8"  (1.727 m)   Wt 160 lb (72.6 kg)   SpO2 98% Comment: RA  BMI 24.33 kg/m  General appearance: alert, cooperative and no distress Neck: no adenopathy, no carotid bruit, no JVD, supple, symmetrical, trachea midline and thyroid not enlarged, symmetric, no tenderness/mass/nodules Resp: clear to auscultation bilaterally Cardio: regular rate and rhythm, S1, S2 normal, no murmur, click, rub or gallop GI: soft, non-tender; bowel sounds normal; no masses,  no organomegaly Extremities: extremities normal, atraumatic, no cyanosis or edema and Homans sign is negative, no sign of DVT Neurologic: Grossly normal   Diagnostic Studies & Laboratory data:     Recent Radiology Findings:   DG Chest 2 View  Result Date: 12/03/2019 CLINICAL DATA:  Post right lung surgery. EXAM: CHEST - 2 VIEW COMPARISON:  10/27/2019 FINDINGS: Prior aortic valve repair and CABG. Heart is normal size. Postoperative changes in the right upper lobe. No pneumothorax or effusion. Lungs otherwise clear. No acute bony abnormality. IMPRESSION: Postoperative changes on the right. No acute cardiopulmonary disease. Electronically Signed   By: Rolm Baptise M.D.   On: 12/03/2019  12:08   I have independently reviewed the above radiology studies  and reviewed the findings with the patient.    Recent Lab Findings: Lab Results  Component Value Date   WBC 9.5 10/16/2019   HGB 12.5 (L) 10/16/2019   HCT 37.8 (L) 10/16/2019   PLT 85 (L) 10/16/2019   GLUCOSE 115 (H) 10/16/2019   CHOL 193 06/17/2017   TRIG 114 06/17/2017   HDL 35 (L) 06/17/2017   LDLCALC 135 (H) 06/17/2017   ALT 11 10/16/2019   AST 22 10/16/2019   NA 143 10/16/2019   K 4.5 10/16/2019   CL 106 10/16/2019   CREATININE 1.97 (H) 10/16/2019   BUN 48 (H) 10/16/2019   CO2 27 10/16/2019   TSH 3.558 12/24/2017   INR 1.2 10/12/2019   HGBA1C 5.5 05/22/2019  Chronic Kidney Disease   Stage I     GFR >90  Stage II    GFR 60-89  Stage IIIA GFR 45-59  Stage IIIB GFR 30-44  Stage IV   GFR 15-29  Stage V    GFR  <15  Lab Results  Component Value Date   CREATININE 1.97 (H) 10/16/2019   CrCl cannot be calculated (Patient's most recent lab result is older than the maximum 21 days allowed.).    Assessment / Plan:   #1 stable postop progression followingrecent robotic assisted wedge resection right upper lobe with-final path -granuloma so far since microbiology stains and cultures have been negative-pathology suggested yeast forms  #2  Patient will continue with his cardiology follow-up through the TAVR clinic, and will have been follow-up with Dr. Chase Caller for his pulmonary issues.   Medication Changes: No orders of the defined types were placed in this encounter.     Grace Isaac MD      [address] [place] [address] Office [phone]     12/04/2019 11:41 AM

## 2019-12-08 ENCOUNTER — Telehealth: Payer: Self-pay | Admitting: Cardiology

## 2019-12-08 NOTE — Telephone Encounter (Signed)
Returned call to patient's wife - patient had high HR up to 124bpm lasting about 1 hour last night. During this time, he did not have chest pain or shortness of breath - just felt weak. At time of call, his HR is in low 90s. Wife reports BP is good - 110/53.   He has no acute concerns Confirmed medications He has appointment with MD on 10/4  Advised will send message to Dr. Harrell Gave as Juluis Rainier and will call back if she has changes/recommendations for him.

## 2019-12-08 NOTE — Telephone Encounter (Signed)
Thanks for the update. I would not make any changes at this time, but if they can keep a log of BP/HR (they usually do) we will go over everything at the visit. Thanks.

## 2019-12-08 NOTE — Telephone Encounter (Signed)
Patient wife updated - advised to bring log of BP/HR to next visit

## 2019-12-08 NOTE — Telephone Encounter (Signed)
STAT if HR is under 50 or over 120 (normal HR is 60-100 beats per minute)  1) What is your heart rate? Has not checked today, but not having any symptoms  2) Do you have a log of your heart rate readings (document readings)? 104 last night  3) Do you have any other symptoms? No  Patient's wife states the patient has been doing really well, but last night his heart was beating very fast. She states his BP was good, but his HR was 104. She states he has not had any symptoms today. She would like to know if his medication needs to be adjusted or if he can wait until hiss appointment 12/21/2019.

## 2019-12-09 DIAGNOSIS — I501 Left ventricular failure: Secondary | ICD-10-CM | POA: Diagnosis not present

## 2019-12-09 DIAGNOSIS — I5022 Chronic systolic (congestive) heart failure: Secondary | ICD-10-CM | POA: Diagnosis not present

## 2019-12-09 DIAGNOSIS — G4700 Insomnia, unspecified: Secondary | ICD-10-CM | POA: Diagnosis not present

## 2019-12-09 DIAGNOSIS — I2510 Atherosclerotic heart disease of native coronary artery without angina pectoris: Secondary | ICD-10-CM | POA: Diagnosis not present

## 2019-12-09 DIAGNOSIS — I10 Essential (primary) hypertension: Secondary | ICD-10-CM | POA: Diagnosis not present

## 2019-12-09 DIAGNOSIS — N183 Chronic kidney disease, stage 3 unspecified: Secondary | ICD-10-CM | POA: Diagnosis not present

## 2019-12-09 DIAGNOSIS — I480 Paroxysmal atrial fibrillation: Secondary | ICD-10-CM | POA: Diagnosis not present

## 2019-12-09 DIAGNOSIS — E039 Hypothyroidism, unspecified: Secondary | ICD-10-CM | POA: Diagnosis not present

## 2019-12-09 DIAGNOSIS — E7800 Pure hypercholesterolemia, unspecified: Secondary | ICD-10-CM | POA: Diagnosis not present

## 2019-12-09 DIAGNOSIS — I5030 Unspecified diastolic (congestive) heart failure: Secondary | ICD-10-CM | POA: Diagnosis not present

## 2019-12-16 DIAGNOSIS — I10 Essential (primary) hypertension: Secondary | ICD-10-CM | POA: Diagnosis not present

## 2019-12-21 ENCOUNTER — Ambulatory Visit (INDEPENDENT_AMBULATORY_CARE_PROVIDER_SITE_OTHER): Payer: PPO | Admitting: Cardiology

## 2019-12-21 ENCOUNTER — Encounter: Payer: Self-pay | Admitting: Cardiology

## 2019-12-21 ENCOUNTER — Other Ambulatory Visit: Payer: Self-pay

## 2019-12-21 VITALS — BP 112/64 | HR 68 | Ht 68.0 in | Wt 165.6 lb

## 2019-12-21 DIAGNOSIS — I482 Chronic atrial fibrillation, unspecified: Secondary | ICD-10-CM | POA: Diagnosis not present

## 2019-12-21 DIAGNOSIS — Z7901 Long term (current) use of anticoagulants: Secondary | ICD-10-CM

## 2019-12-21 DIAGNOSIS — I5042 Chronic combined systolic (congestive) and diastolic (congestive) heart failure: Secondary | ICD-10-CM

## 2019-12-21 DIAGNOSIS — Z952 Presence of prosthetic heart valve: Secondary | ICD-10-CM | POA: Diagnosis not present

## 2019-12-21 DIAGNOSIS — I2510 Atherosclerotic heart disease of native coronary artery without angina pectoris: Secondary | ICD-10-CM

## 2019-12-21 NOTE — Patient Instructions (Signed)

## 2019-12-21 NOTE — Progress Notes (Signed)
Cardiology Office Note:    Date:  12/21/2019   ID:  [name], DOB 09/05/1931, MRN [id number]  PCP:  [name], MD  Cardiologist:  Buford Dresser, MD  Referring MD: [name], MD   Chief Complaint:  Follow up  History of Present Illness:    [name] is a 86 y.o. male with a hx of CAD s/p CABG, mitral valve repair, atrial fibrillation on anticoagulation, interstitial lung disease, chronic systolic and diastolic heart failure, low flow low gradient aortic stenosis s/p TAVR who is seen in follow up today. He follows at the [facility] in [place].   Today: Blood pressure and heart rate are excellent today, much improved from when I ment him. Doing very well since TAVR. 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 Feels the best he has felt in a long time, and his wife is thrilled as well. No issues with medications, no issues getting medications from the [facility]. Weights, blood pressures have been stable.  Denies chest pain, shortness of breath at rest or with normal exertion. No PND, orthopnea, LE edema or unexpected weight gain. No syncope or palpitations.  Past Medical History:  Diagnosis Date  . Adenomatous colon polyp   . CAD (coronary artery disease)    a. S/P CABG x 1 @ time of MV annuloplasty;  b. 02/2010 ETT: neg for ischemia.  . Carotid artery stenosis    1-39% by dopplers 10/2016  . Chronic combined systolic (congestive) and diastolic (congestive) heart failure ([place])   . CKD (chronic kidney disease), stage III ([place])   . Current use of long term anticoagulation    a. Coumadin in setting of afib.  . Diverticulosis   . Dyslipidemia   . Dysrhythmia    A-fib  . Essential hypertension   . H/O mitral valve repair 09/16/2002   Complex valvuloplasty including quadrangular resection of posterior leaflet with artificial Gore-tex neochords and 28 mm Seguin ring annuloplasty - Dr Servando Snare  . Hemorrhoids   . Hyperlipidemia   .  Hypertension   . Permanent atrial fibrillation (HCC)    a. CHA2DS2VASc = 6 -->chronic coumadin;  b. Prev on amio-->d/c 2/2 hypothyroidism. >> resumed 5/16. c. Notes from 2017 indicate interstitial lung disease by pulm appt, question amio toxicity, also increased liver attenuation. Rate control pursued and amiodarone discontinued.  . S/P CABG x 1 09/16/2002   SVG to OM  . 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 Sleep apnea 12/2018   per patient's spouse wears BIPAP "every other night or so"  . Thrombocytopenia ([place])   . TIA (transient ischemic attack)    Past Surgical History:  Procedure Laterality Date  . CATARACT EXTRACTION W/ INTRAOCULAR LENS  IMPLANT, BILATERAL     per patient about 4 years ago  . CORONARY ARTERY BYPASS GRAFT  2004  . ELECTROMAGNETIC NAVIGATION BROCHOSCOPY  10/14/2019   Procedure: NAVIGATION BRONCHOSCOPY;  Surgeon: Garner Nash, DO;  Location: [facility];  Service: Pulmonary;;  . INTERCOSTAL NERVE BLOCK Right 10/14/2019   Procedure: INTERCOSTAL NERVE BLOCK;  Surgeon: Grace Isaac, MD;  Location: [facility];  Service: Thoracic;  Laterality: Right;  . INTRAOPERATIVE TRANSTHORACIC ECHOCARDIOGRAM N/A 05/26/2019   Procedure: INTRAOPERATIVE TRANSTHORACIC ECHOCARDIOGRAM;  Surgeon: Sherren Mocha, MD;  Location: [facility];  Service: Open Heart Surgery;  Laterality: N/A;  . MITRAL VALVE  REPAIR    . RIGHT/LEFT HEART CATH AND CORONARY/GRAFT ANGIOGRAPHY N/A 04/28/2019   Procedure: RIGHT/LEFT HEART CATH AND CORONARY/GRAFT ANGIOGRAPHY;  Surgeon: Sherren Mocha, MD;  Location: [facility];  Service: Cardiovascular;  Laterality: N/A;  . SUBMUCOSAL TATTOO INJECTION  10/14/2019   Procedure: ENDOBRONCHIAL DYE INJECTION;  Surgeon: Garner Nash, DO;  Location: [facility];  Service: Pulmonary;;  . TEE WITHOUT CARDIOVERSION N/A 05/20/2019   Procedure: TRANSESOPHAGEAL  ECHOCARDIOGRAM (TEE);  Surgeon: Donato Heinz, MD;  Location: [facility];  Service: Cardiovascular;  Laterality: N/A;  . TRANSCATHETER AORTIC VALVE REPLACEMENT, TRANSFEMORAL N/A 05/26/2019   Procedure: TRANSCATHETER AORTIC VALVE REPLACEMENT, TRANSFEMORAL;  Surgeon: Sherren Mocha, MD;  Location: [facility];  Service: Open Heart Surgery;  Laterality: N/A;  . VIDEO BRONCHOSCOPY WITH ENDOBRONCHIAL NAVIGATION N/A 10/14/2019   Procedure: VIDEO BRONCHOSCOPY;  Surgeon: Garner Nash, DO;  Location: [facility];  Service: Pulmonary;  Laterality: N/A;     Current Meds  Medication Sig  . apixaban (ELIQUIS) 2.5 MG TABS tablet Take 1 tablet (2.5 mg total) by mouth 2 (two) times daily.  . feeding supplement, ENSURE ENLIVE, (ENSURE ENLIVE) LIQD Take 237 mLs by mouth 2 (two) times daily between meals.  Marland Kitchen levothyroxine (SYNTHROID) 75 MCG tablet Take 75 mcg by mouth daily before breakfast.   . metoprolol succinate (TOPROL-XL) 50 MG 24 hr tablet TAKE ONE TABLET BY MOUTH ONE TIME DAILY  . Polyethyl Glycol-Propyl Glycol (SYSTANE OP) Place 1 drop into both eyes 3 (three) times daily as needed (dry/irritated eyes.).   Marland Kitchen potassium chloride SA (KLOR-CON) 20 MEQ tablet TAKE ONE TABLET BY MOUTH TWICE DAILY  . torsemide (DEMADEX) 20 MG tablet Take 20-40 mg by mouth See admin instructions. Take 2 tablets (40 mg) by mouth in the morning & take 1 tablet (20 mg) by mouth at night.  . vitamin B-12 (CYANOCOBALAMIN) 1000 MCG tablet Take 1,000 mcg by mouth daily.      Allergies:   Asa [aspirin], Flomax [tamsulosin], and Pirfenidone   Social History   Tobacco Use  . Smoking status: Former Smoker    Packs/day: 0.10    Years: 2.00    Pack years: 0.20    Types: Cigarettes    Quit date: 03/20/1955    Years since quitting: 64.8  . Smokeless tobacco: Never Used  Vaping Use  . Vaping Use: Never used  Substance Use Topics  . Alcohol use: No    Alcohol/week: 0.0 standard drinks  . Drug use: No     Family Hx: The  patient's family history includes Heart disease in his father; Heart failure in his brother; Stroke in his brother and mother.  ROS:   Please see the history of present illness.    All other systems reviewed and are negative.   Prior CV studies:   The following studies were reviewed today: Echo 10/06/19 1. Left ventricular ejection fraction, by estimation, is 25 to 30%. The  left ventricle has severely decreased function. The left ventricle  demonstrates global hypokinesis. There is mild left ventricular  hypertrophy. Left ventricular diastolic parameters  are indeterminate.  2. Right ventricular systolic function is mildly reduced. The right  ventricular size is normal. There is normal pulmonary artery systolic  pressure. The estimated right ventricular systolic pressure is 81.1 mmHg.  3. Left atrial size was moderately dilated.  4. The mitral valve has been repaired/replaced. Mild mitral valve  regurgitation. No evidence of mitral stenosis. The mean mitral valve  gradient is 2.0 mmHg with  average heart rate of 60 bpm. There is a 28 mm  prosthetic annuloplasty ring present in the  mitral position.  5. The aortic valve has been repaired/replaced. Aortic valve  regurgitation is trivial. There is a 29 mm Edwards Edwards Sapien  prosthetic (TAVR) valve present in the aortic position. Echo findings are  consistent with a trivial perivalvular leak of the  aortic prosthesis. Aortic valve peak and mean gradients of 4 and 7 mmHg,  respectively. AVA 2.75 cm2.  6. Aortic dilatation noted. There is borderline dilatation at the level  of the sinuses of Valsalva measuring 38 mm. The ascending aorta measures  normal in size.  7. The inferior vena cava is normal in size with greater than 50%  respiratory variability, suggesting right atrial pressure of 3 mmHg.   Comparison(s): Changes from prior study are noted. 06/25/19: LVEF <20%, TAVR  with P/M gradients of 4 and 6 mmHg, trivial  perivalvular leak.   Echo 06/25/19 1. Left ventricular ejection fraction, by estimation, is <20%. The left  ventricle has severely decreased function. The left ventricle demonstrates  global hypokinesis. The left ventricular internal cavity size was mildly  dilated. Left ventricular  diastolic parameters are indeterminate.  2. S/p mitral valve repair. Mild-moderate mitral regurgitation. Mean  gradient 4 mmHg, mild mitral stenosis.  3. Bioprosthetic aortic valve s/p TAVR, 29 mm Edwards Sapien. Trivial  perivalvular regurgitation. Mean gradient 4 mmHg, no significant stenosis.  4. Left atrial size was moderately dilated.  5. Right ventricular systolic function is mildly reduced. The right  ventricular size is mildly enlarged. There is mildly elevated pulmonary  artery systolic pressure. The estimated right ventricular systolic  pressure is 06.2 mmHg.  6. The inferior vena cava is normal in size with <50% respiratory  variability, suggesting right atrial pressure of 8 mmHg.   Echo 01/13/19  1. Left ventricular ejection fraction, by visual estimation, is 20 to 25%. The left ventricle has severely decreased function. Moderate to severely increased left ventricular size. There is no left ventricular hypertrophy.  2. Left ventricular diastolic function could not be evaluated pattern of LV diastolic filling.  3. Global right ventricle has normal systolic function.The right ventricular size is mildly enlarged. No increase in right ventricular wall thickness.  4. Left atrial size was moderately dilated.  5. Right atrial size was moderately dilated.  6. Moderate calcification of the mitral valve leaflet(s).  7. Moderate thickening of the mitral valve leaflet(s).  8. Severely decreased mobility of the mitral valve leaflets.  9. The mitral valve has been repaired/replaced. Moderate to severe mitral valve regurgitation. 10. The tricuspid valve is normal in structure. Tricuspid valve regurgitation  is mild. 11. The aortic valve is abnormal Aortic valve regurgitation is mild by color flow Doppler. 12. There is Severe calcifcation of the aortic valve. 13. There is Severely thickening of the aortic valve. 14. Suspect low flow-low gradient AS given visual limited movement of aortic valve and severely reduced LV EF. 15. The pulmonic valve was grossly normal. Pulmonic valve regurgitation is mild to moderate by color flow Doppler. 16. Moderately elevated pulmonary artery systolic pressure. 17. The inferior vena cava is normal in size with <50% respiratory variability, suggesting right atrial pressure of 8 mmHg. 18. While heart rate much improved on this study, side by side comparison shows dilation of the LV and stable severely reduced LV EF. The aortic valve is severely thickened/calcified and visually appears to minimally open. Suspect low flow low gradient  severe AS, as dimensionless  index 0.18. Prior MV repair with immobile posterior mitral valve leaflet.  Labs/Other Tests and Data Reviewed:    EKG:  An ECG dated 10/12/19 was personally reviewed today and demonstrated:  atrial fibrillation  Recent Labs: 05/22/2019: B Natriuretic Peptide 3,448.0 05/27/2019: Magnesium 1.7 10/16/2019: ALT 11; BUN 48; Creatinine, Ser 1.97; Hemoglobin 12.5; Platelets 85; Potassium 4.5; Sodium 143   Recent Lipid Panel Lab Results  Component Value Date/Time   CHOL 193 06/17/2017 07:44 AM   TRIG 114 06/17/2017 07:44 AM   HDL 35 (L) 06/17/2017 07:44 AM   CHOLHDL 5.5 (H) 06/17/2017 07:44 AM   CHOLHDL 2.6 03/16/2016 09:03 AM   LDLCALC 135 (H) 06/17/2017 07:44 AM    Wt Readings from Last 3 Encounters:  12/21/19 165 lb 9.6 oz (75.1 kg)  12/03/19 160 lb (72.6 kg)  10/27/19 162 lb (73.5 kg)     Objective:    Vital Signs:  BP 112/64   Pulse 68   Ht 5\' 8"  (1.727 m)   Wt 165 lb 9.6 oz (75.1 kg)   SpO2 98%   BMI 25.18 kg/m    GEN: Well nourished, well developed in no acute distress HEENT: Normal, moist  mucous membranes NECK: No JVD CARDIAC: irregularly irregular rhythm, normal S1 and S2, no rubs or gallops. No murmur appreciated today. VASCULAR: Radial and DP pulses 2+ bilaterally. No carotid bruits RESPIRATORY:  Clear to auscultation with prolonged expiratory phase. Well healed incision sites ABDOMEN: Soft, non-tender, non-distended MUSCULOSKELETAL:  Ambulates independently SKIN: Warm and dry, no edema NEUROLOGIC:  Alert and oriented x 3. No focal neuro deficits noted. PSYCHIATRIC:  Normal affect   ASSESSMENT & PLAN:    Chronic systolicand diastolicheart failure:systolic failure diagnosed in 12/2017. Pre-TAVR EF <20% -repeat echo with EF 25-30% 09/2019 -intentionally lost weight, has now been stable -reports doing well on torsemide 40 mg in the AM and 20 mg in the PM -continue metoprolol succinate -no room for ACEi/ARB/ARNI/MRA given history of hypotension and CKD, continue to monitor  Atrial Fibrillation, permanent:  -drastically improved rate control since TAVR. Had been extremely difficult to control rate prior. 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 -no chest pain -on pravastatin (see below), no aspirin given apixaban for afib  Hyperlipidemia: -Goal LDL <70, previously LDL 135 in 2019 (not at goal). Followed through [facility] as well -On pravastatin 80 mg. We have discussed  intensifying in the past, but given age/comorbidities/lack of symptoms have left alone.  Mitral Regurgitation: h/o MV repair. Mild-moderate on TAVR TEE  Aortic Stenosis, severe, s/p TAVR: feeling the best he has felt in a long time. -has endocarditis prophylaxis ordered  Follow up: 6 mos or sooner as needed  No orders of the defined types were placed in this encounter. No orders of the defined types were placed in this encounter.   Patient Instructions  Medication Instructions:  Your Physician recommend you continue on your current medication as directed.    *  If you need a refill on your cardiac medications before your next appointment, please call your pharmacy*   Lab Work: None ordered   Testing/Procedures: None ordered    Follow-Up: At [facility], you and your health needs are our priority.  As part of our continuing mission to provide you with exceptional heart care, we have created designated Provider Care Teams.  These Care Teams include your primary Cardiologist (physician) and Advanced Practice Providers (APPs -  Physician Assistants and Nurse Practitioners) who all work together to provide you with the care you need, when you need it.  We recommend signing up for the patient portal called "MyChart".  Sign up information is provided on this After Visit Summary.  MyChart is used to connect with patients for Virtual Visits (Telemedicine).  Patients are able to view lab/test results, encounter notes, upcoming appointments, etc.  Non-urgent messages can be sent to your provider as well.   To learn more about what you can do with MyChart, go to [email or web address].    Your next appointment:   6 month(s)  The format for your next appointment:   In Person  Provider:   Buford Dresser, MD       Signed, Buford Dresser, MD  12/21/2019  [place]

## 2020-01-11 DIAGNOSIS — I480 Paroxysmal atrial fibrillation: Secondary | ICD-10-CM | POA: Diagnosis not present

## 2020-01-11 DIAGNOSIS — I10 Essential (primary) hypertension: Secondary | ICD-10-CM | POA: Diagnosis not present

## 2020-01-11 DIAGNOSIS — I501 Left ventricular failure: Secondary | ICD-10-CM | POA: Diagnosis not present

## 2020-01-11 DIAGNOSIS — E039 Hypothyroidism, unspecified: Secondary | ICD-10-CM | POA: Diagnosis not present

## 2020-01-11 DIAGNOSIS — I5022 Chronic systolic (congestive) heart failure: Secondary | ICD-10-CM | POA: Diagnosis not present

## 2020-01-11 DIAGNOSIS — E7800 Pure hypercholesterolemia, unspecified: Secondary | ICD-10-CM | POA: Diagnosis not present

## 2020-01-11 DIAGNOSIS — N183 Chronic kidney disease, stage 3 unspecified: Secondary | ICD-10-CM | POA: Diagnosis not present

## 2020-01-11 DIAGNOSIS — I2510 Atherosclerotic heart disease of native coronary artery without angina pectoris: Secondary | ICD-10-CM | POA: Diagnosis not present

## 2020-01-11 DIAGNOSIS — G4700 Insomnia, unspecified: Secondary | ICD-10-CM | POA: Diagnosis not present

## 2020-01-14 DIAGNOSIS — I10 Essential (primary) hypertension: Secondary | ICD-10-CM | POA: Diagnosis not present

## 2020-01-28 DIAGNOSIS — I10 Essential (primary) hypertension: Secondary | ICD-10-CM | POA: Diagnosis not present

## 2020-02-09 DIAGNOSIS — I10 Essential (primary) hypertension: Secondary | ICD-10-CM | POA: Diagnosis not present

## 2020-02-10 DIAGNOSIS — I5022 Chronic systolic (congestive) heart failure: Secondary | ICD-10-CM | POA: Diagnosis not present

## 2020-02-10 DIAGNOSIS — E039 Hypothyroidism, unspecified: Secondary | ICD-10-CM | POA: Diagnosis not present

## 2020-02-10 DIAGNOSIS — E7800 Pure hypercholesterolemia, unspecified: Secondary | ICD-10-CM | POA: Diagnosis not present

## 2020-02-10 DIAGNOSIS — G4700 Insomnia, unspecified: Secondary | ICD-10-CM | POA: Diagnosis not present

## 2020-02-10 DIAGNOSIS — I480 Paroxysmal atrial fibrillation: Secondary | ICD-10-CM | POA: Diagnosis not present

## 2020-02-10 DIAGNOSIS — K219 Gastro-esophageal reflux disease without esophagitis: Secondary | ICD-10-CM | POA: Diagnosis not present

## 2020-02-10 DIAGNOSIS — I501 Left ventricular failure: Secondary | ICD-10-CM | POA: Diagnosis not present

## 2020-02-10 DIAGNOSIS — I5030 Unspecified diastolic (congestive) heart failure: Secondary | ICD-10-CM | POA: Diagnosis not present

## 2020-02-10 DIAGNOSIS — N183 Chronic kidney disease, stage 3 unspecified: Secondary | ICD-10-CM | POA: Diagnosis not present

## 2020-02-10 DIAGNOSIS — I2510 Atherosclerotic heart disease of native coronary artery without angina pectoris: Secondary | ICD-10-CM | POA: Diagnosis not present

## 2020-02-10 DIAGNOSIS — I10 Essential (primary) hypertension: Secondary | ICD-10-CM | POA: Diagnosis not present

## 2020-02-22 DIAGNOSIS — Z961 Presence of intraocular lens: Secondary | ICD-10-CM | POA: Diagnosis not present

## 2020-02-22 DIAGNOSIS — H52203 Unspecified astigmatism, bilateral: Secondary | ICD-10-CM | POA: Diagnosis not present

## 2020-02-22 DIAGNOSIS — H40053 Ocular hypertension, bilateral: Secondary | ICD-10-CM | POA: Diagnosis not present

## 2020-03-02 DIAGNOSIS — I5022 Chronic systolic (congestive) heart failure: Secondary | ICD-10-CM | POA: Diagnosis not present

## 2020-03-02 DIAGNOSIS — E7800 Pure hypercholesterolemia, unspecified: Secondary | ICD-10-CM | POA: Diagnosis not present

## 2020-03-02 DIAGNOSIS — E039 Hypothyroidism, unspecified: Secondary | ICD-10-CM | POA: Diagnosis not present

## 2020-03-02 DIAGNOSIS — I10 Essential (primary) hypertension: Secondary | ICD-10-CM | POA: Diagnosis not present

## 2020-03-02 DIAGNOSIS — I5030 Unspecified diastolic (congestive) heart failure: Secondary | ICD-10-CM | POA: Diagnosis not present

## 2020-03-02 DIAGNOSIS — I480 Paroxysmal atrial fibrillation: Secondary | ICD-10-CM | POA: Diagnosis not present

## 2020-03-02 DIAGNOSIS — I2510 Atherosclerotic heart disease of native coronary artery without angina pectoris: Secondary | ICD-10-CM | POA: Diagnosis not present

## 2020-03-02 DIAGNOSIS — I501 Left ventricular failure: Secondary | ICD-10-CM | POA: Diagnosis not present

## 2020-03-02 DIAGNOSIS — N183 Chronic kidney disease, stage 3 unspecified: Secondary | ICD-10-CM | POA: Diagnosis not present

## 2020-03-02 DIAGNOSIS — K219 Gastro-esophageal reflux disease without esophagitis: Secondary | ICD-10-CM | POA: Diagnosis not present

## 2020-03-02 DIAGNOSIS — G4700 Insomnia, unspecified: Secondary | ICD-10-CM | POA: Diagnosis not present

## 2020-03-15 DIAGNOSIS — I10 Essential (primary) hypertension: Secondary | ICD-10-CM | POA: Diagnosis not present

## 2020-03-17 DIAGNOSIS — I10 Essential (primary) hypertension: Secondary | ICD-10-CM | POA: Diagnosis not present

## 2020-04-07 ENCOUNTER — Other Ambulatory Visit: Payer: Self-pay | Admitting: Physician Assistant

## 2020-04-15 DIAGNOSIS — E7800 Pure hypercholesterolemia, unspecified: Secondary | ICD-10-CM | POA: Diagnosis not present

## 2020-04-15 DIAGNOSIS — I10 Essential (primary) hypertension: Secondary | ICD-10-CM | POA: Diagnosis not present

## 2020-04-15 DIAGNOSIS — K219 Gastro-esophageal reflux disease without esophagitis: Secondary | ICD-10-CM | POA: Diagnosis not present

## 2020-04-15 DIAGNOSIS — G4700 Insomnia, unspecified: Secondary | ICD-10-CM | POA: Diagnosis not present

## 2020-04-15 DIAGNOSIS — I5030 Unspecified diastolic (congestive) heart failure: Secondary | ICD-10-CM | POA: Diagnosis not present

## 2020-04-15 DIAGNOSIS — I2510 Atherosclerotic heart disease of native coronary artery without angina pectoris: Secondary | ICD-10-CM | POA: Diagnosis not present

## 2020-04-15 DIAGNOSIS — I501 Left ventricular failure: Secondary | ICD-10-CM | POA: Diagnosis not present

## 2020-04-15 DIAGNOSIS — E039 Hypothyroidism, unspecified: Secondary | ICD-10-CM | POA: Diagnosis not present

## 2020-04-15 DIAGNOSIS — I5022 Chronic systolic (congestive) heart failure: Secondary | ICD-10-CM | POA: Diagnosis not present

## 2020-04-15 DIAGNOSIS — N183 Chronic kidney disease, stage 3 unspecified: Secondary | ICD-10-CM | POA: Diagnosis not present

## 2020-04-15 DIAGNOSIS — I480 Paroxysmal atrial fibrillation: Secondary | ICD-10-CM | POA: Diagnosis not present

## 2020-04-18 DIAGNOSIS — I10 Essential (primary) hypertension: Secondary | ICD-10-CM | POA: Diagnosis not present

## 2020-05-04 DIAGNOSIS — G4700 Insomnia, unspecified: Secondary | ICD-10-CM | POA: Diagnosis not present

## 2020-05-04 DIAGNOSIS — I10 Essential (primary) hypertension: Secondary | ICD-10-CM | POA: Diagnosis not present

## 2020-05-04 DIAGNOSIS — I2510 Atherosclerotic heart disease of native coronary artery without angina pectoris: Secondary | ICD-10-CM | POA: Diagnosis not present

## 2020-05-04 DIAGNOSIS — I5030 Unspecified diastolic (congestive) heart failure: Secondary | ICD-10-CM | POA: Diagnosis not present

## 2020-05-04 DIAGNOSIS — K219 Gastro-esophageal reflux disease without esophagitis: Secondary | ICD-10-CM | POA: Diagnosis not present

## 2020-05-04 DIAGNOSIS — I5022 Chronic systolic (congestive) heart failure: Secondary | ICD-10-CM | POA: Diagnosis not present

## 2020-05-04 DIAGNOSIS — N184 Chronic kidney disease, stage 4 (severe): Secondary | ICD-10-CM | POA: Diagnosis not present

## 2020-05-04 DIAGNOSIS — E7800 Pure hypercholesterolemia, unspecified: Secondary | ICD-10-CM | POA: Diagnosis not present

## 2020-05-04 DIAGNOSIS — I480 Paroxysmal atrial fibrillation: Secondary | ICD-10-CM | POA: Diagnosis not present

## 2020-05-04 DIAGNOSIS — I501 Left ventricular failure: Secondary | ICD-10-CM | POA: Diagnosis not present

## 2020-05-04 DIAGNOSIS — E039 Hypothyroidism, unspecified: Secondary | ICD-10-CM | POA: Diagnosis not present

## 2020-05-06 DIAGNOSIS — E7800 Pure hypercholesterolemia, unspecified: Secondary | ICD-10-CM | POA: Diagnosis not present

## 2020-05-06 DIAGNOSIS — G4700 Insomnia, unspecified: Secondary | ICD-10-CM | POA: Diagnosis not present

## 2020-05-06 DIAGNOSIS — N183 Chronic kidney disease, stage 3 unspecified: Secondary | ICD-10-CM | POA: Diagnosis not present

## 2020-05-06 DIAGNOSIS — J8489 Other specified interstitial pulmonary diseases: Secondary | ICD-10-CM | POA: Diagnosis not present

## 2020-05-06 DIAGNOSIS — I480 Paroxysmal atrial fibrillation: Secondary | ICD-10-CM | POA: Diagnosis not present

## 2020-05-06 DIAGNOSIS — I10 Essential (primary) hypertension: Secondary | ICD-10-CM | POA: Diagnosis not present

## 2020-05-06 DIAGNOSIS — I5030 Unspecified diastolic (congestive) heart failure: Secondary | ICD-10-CM | POA: Diagnosis not present

## 2020-05-06 DIAGNOSIS — R739 Hyperglycemia, unspecified: Secondary | ICD-10-CM | POA: Diagnosis not present

## 2020-05-06 DIAGNOSIS — Z0000 Encounter for general adult medical examination without abnormal findings: Secondary | ICD-10-CM | POA: Diagnosis not present

## 2020-05-06 DIAGNOSIS — E039 Hypothyroidism, unspecified: Secondary | ICD-10-CM | POA: Diagnosis not present

## 2020-05-06 DIAGNOSIS — G4733 Obstructive sleep apnea (adult) (pediatric): Secondary | ICD-10-CM | POA: Diagnosis not present

## 2020-05-19 ENCOUNTER — Ambulatory Visit (HOSPITAL_COMMUNITY): Payer: PPO | Attending: Cardiology

## 2020-05-19 ENCOUNTER — Other Ambulatory Visit: Payer: Self-pay

## 2020-05-19 ENCOUNTER — Ambulatory Visit: Payer: PPO | Admitting: Physician Assistant

## 2020-05-19 ENCOUNTER — Encounter: Payer: Self-pay | Admitting: Physician Assistant

## 2020-05-19 VITALS — BP 120/80 | HR 95 | Ht 69.0 in | Wt 177.0 lb

## 2020-05-19 DIAGNOSIS — N184 Chronic kidney disease, stage 4 (severe): Secondary | ICD-10-CM

## 2020-05-19 DIAGNOSIS — I2510 Atherosclerotic heart disease of native coronary artery without angina pectoris: Secondary | ICD-10-CM

## 2020-05-19 DIAGNOSIS — Z952 Presence of prosthetic heart valve: Secondary | ICD-10-CM

## 2020-05-19 DIAGNOSIS — I10 Essential (primary) hypertension: Secondary | ICD-10-CM | POA: Diagnosis not present

## 2020-05-19 DIAGNOSIS — I5042 Chronic combined systolic (congestive) and diastolic (congestive) heart failure: Secondary | ICD-10-CM | POA: Diagnosis not present

## 2020-05-19 DIAGNOSIS — I482 Chronic atrial fibrillation, unspecified: Secondary | ICD-10-CM | POA: Diagnosis not present

## 2020-05-19 DIAGNOSIS — Z9889 Other specified postprocedural states: Secondary | ICD-10-CM

## 2020-05-19 LAB — ECHOCARDIOGRAM COMPLETE
AR max vel: 1.41 cm2
AV Area VTI: 1.15 cm2
AV Area mean vel: 1.32 cm2
AV Mean grad: 6 mmHg
AV Peak grad: 8.8 mmHg
Ao pk vel: 1.48 m/s
Area-P 1/2: 2.6 cm2
MV M vel: 4.59 m/s
MV Peak grad: 84.1 mmHg
MV VTI: 1.15 cm2
Radius: 0.5 cm
S' Lateral: 3.4 cm

## 2020-05-19 NOTE — Progress Notes (Signed)
[facility]                                       Cardiology Office Note    Date:  05/19/2020   ID:  [name], DOB [birth year], MRN [id number]  PCP:  [name], MD  Cardiologist: Buford Dresser, MD / Dr. Burt Knack & Dr. Roxy Manns (TAVR)  CC: 1 year s/p TAVR  History of Present Illness:  [name] is a 87 y.o. 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   Patient's cardiac history dates back to 2004 when he underwent mitral valve repair and coronary artery bypass grafting x1 by Dr. Servando Snare. By report the patient had mitral valve prolapse and underwent quadrangular resection of the posterior leaflet with artificial Gore-Tex neocords placement and ring annuloplasty using a 28 mm Seguinsemirigid ring. At the time he underwent coronary artery bypass grafting using a reverse greater saphenous vein graft to the obtuse marginal branch of the left circumflex.   The patient developed persistent atrial fibrillation for which she has remained on long-term anticoagulation, initially using warfarin and then switched to Eliquis. 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 Patient was hospitalized in October 2019 with acute exacerbation of shortness of breath and congestive heart failure. He required intensive inpatient diuresis with readjustment of his medications, and since that time he has been followed regularly  by Dr. Harrell Gave.The patient has remained in longstanding persistent atrial fibrillation and both heart rate and symptoms of congestive heart failure have been difficult to control. Previous echocardiograms have revealed what was felt to be mild aortic stenosis, but then subsequent echocardiograms suggested significant progression with the possibility of severe low-flow low gradient aortic stenosis. Echocardiogram January 13, 2019 revealed severe global left ventricular systolic dysfunction with ejection fraction estimated only 20 to 25% in the setting of possibly severe mitral regurgitation. There was moderate to severe left ventricular chamber enlargement but no significant left ventricular hypertrophy. The aortic valve leaflets appeared severely calcified. Peak velocity across aortic valve measured 2.3 m/s corresponding to mean transvalvular gradient estimated 12.2 mmHg but the aortic valve area was notably estimated only 0.77 cm with DVI reported 0.18 and stroke-volume index reported 29.16. Diagnostic cardiac catheterization was performed April 28, 2019. Patient was found to have a mild coronary artery disease with chronic occlusion of the first obtuse marginal branch of the left circumflex but continued patency of the vein graft to the same branch. Otherwise there was mild diffuse disease in the distal right coronary artery and nonobstructive disease in the left coronary system. Catheterization confirmed presence of aortic stenosis with mean transvalvular gradient measured 33 mmHg at catheterization corresponding to aortic valve area calculated 0.8 cm. There was moderate to severe pulmonary hypertension with PA pressures measured 54/17 despite relatively low systemic arterial pressure. There were large V waves on pulmonary capillary wedge pressure consistent with severe mitral regurgitation.Mean central venous  pressure was 11 mmHg.  The pt was evaluated by the multidisciplinary valve team and  underwent successful TAVR with a71mm Edwards Sapien 3 THV via the TF approach on 05/26/19. Post operative echo showed EF 20-25%, normally functioning TAVR with a mean gradient of 4.3 mm HG and no PVL. He was discharged on Eliquis alone given history of rectal bleeding on aspirin. His diurectic were decreased after TAVR. 1 month echo showed  EF <20%, normally functioning TAVR with a mean gradient of 9 mm Hg and no PVL.  He has felt markedly better since TAVR.  Pre TAVR CT showed an enlarging right upper lobe pulmonary nodule measuring 9 x 5 mm (mean diameter of 7 mm), which previously had a mean diameter of only 4 mm on 12/09/2018. 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 He is followed by Dr. Chase Caller.   Today he presents to clinic for follow up. Here with wife. Doing excellent. He bikes 5 minutes a day. He stays very active. He is so pleased with how he has done since TAVR. No CP or SOB. No LE edema, orthopnea or PND. No dizziness or syncope. No blood in stool or urine. No palpitations.      Past Medical History:  Diagnosis Date  . Adenomatous colon polyp   . CAD (coronary artery disease)    a. S/P CABG x 1 @ time of MV annuloplasty;  b. 02/2010 ETT: neg for ischemia.  . Carotid artery stenosis    1-39% by dopplers 10/2016  . Chronic combined systolic (congestive) and diastolic (congestive) heart failure ([place])   . CKD (chronic kidney disease), stage III ([place])   . Current use of long term anticoagulation    a. Coumadin in setting of afib.  . Diverticulosis   . Dyslipidemia   . Dysrhythmia    A-fib  . Essential hypertension   . H/O mitral valve repair 09/16/2002   Complex valvuloplasty including quadrangular resection of posterior leaflet with artificial Gore-tex neochords and 28 mm Seguin ring annuloplasty - Dr Servando Snare  . Hemorrhoids   . Hyperlipidemia   . Hypertension   . Permanent atrial fibrillation (HCC)    a.  CHA2DS2VASc = 6 -->chronic coumadin;  b. Prev on amio-->d/c 2/2 hypothyroidism. >> resumed 5/16. c. Notes from 2017 indicate interstitial lung disease by pulm appt, question amio toxicity, also increased liver attenuation. Rate control pursued and amiodarone discontinued.  . S/P CABG x 1 09/16/2002   SVG to OM  . 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 Sleep apnea 12/2018   per patient's spouse wears BIPAP "every other night or so"  . Thrombocytopenia ([place])   . TIA (transient ischemic attack)     Past Surgical History:  Procedure Laterality Date  . CATARACT EXTRACTION W/ INTRAOCULAR LENS  IMPLANT, BILATERAL     per patient about 4 years ago  . CORONARY ARTERY BYPASS GRAFT  2004  . ELECTROMAGNETIC NAVIGATION BROCHOSCOPY  10/14/2019   Procedure: NAVIGATION BRONCHOSCOPY;  Surgeon: Garner Nash, DO;  Location: [facility];  Service: Pulmonary;;  . INTERCOSTAL NERVE BLOCK Right 10/14/2019   Procedure: INTERCOSTAL NERVE BLOCK;  Surgeon: Grace Isaac, MD;  Location: [facility];  Service: Thoracic;  Laterality: Right;  . INTRAOPERATIVE TRANSTHORACIC ECHOCARDIOGRAM N/A 05/26/2019   Procedure: INTRAOPERATIVE TRANSTHORACIC ECHOCARDIOGRAM;  Surgeon: Sherren Mocha, MD;  Location: [facility];  Service: Open Heart Surgery;  Laterality: N/A;  . MITRAL VALVE REPAIR    . RIGHT/LEFT HEART CATH AND CORONARY/GRAFT ANGIOGRAPHY N/A 04/28/2019   Procedure: RIGHT/LEFT HEART CATH AND CORONARY/GRAFT ANGIOGRAPHY;  Surgeon: Sherren Mocha, MD;  Location: [facility];  Service: Cardiovascular;  Laterality: N/A;  . SUBMUCOSAL TATTOO INJECTION  10/14/2019   Procedure: ENDOBRONCHIAL DYE INJECTION;  Surgeon: Garner Nash, DO;  Location: [facility];  Service: Pulmonary;;  . TEE WITHOUT CARDIOVERSION N/A 05/20/2019   Procedure: TRANSESOPHAGEAL ECHOCARDIOGRAM (TEE);  Surgeon: Donato Heinz, MD;   Location[facility];  Service: Cardiovascular;  Laterality: N/A;  . TRANSCATHETER AORTIC VALVE REPLACEMENT, TRANSFEMORAL N/A 05/26/2019   Procedure: TRANSCATHETER AORTIC VALVE REPLACEMENT, TRANSFEMORAL;  Surgeon: Sherren Mocha, MD;  Location: [facility];  Service: Open Heart Surgery;  Laterality: N/A;  . VIDEO BRONCHOSCOPY WITH ENDOBRONCHIAL NAVIGATION N/A 10/14/2019   Procedure: VIDEO BRONCHOSCOPY;  Surgeon: Garner Nash, DO;  Location: [facility];  Service: Pulmonary;  Laterality: N/A;    Current Medications: Outpatient Medications Prior to Visit  Medication Sig Dispense Refill  . apixaban (ELIQUIS) 2.5 MG TABS tablet Take 1 tablet (2.5 mg total) by mouth 2 (two) times daily. 60 tablet 1  . feeding supplement, ENSURE ENLIVE, (ENSURE ENLIVE) LIQD Take 237 mLs by mouth 2 (two) times daily between meals. 237 mL 12  . hydrocortisone cream 1 % 1 application to affected area as needed for hemorrhoids    . levothyroxine (SYNTHROID) 75 MCG tablet Take 75 mcg by mouth daily before breakfast.     . metoprolol succinate (TOPROL-XL) 50 MG 24 hr tablet TAKE ONE TABLET BY MOUTH ONE TIME DAILY 90 tablet 3  . Polyethyl Glycol-Propyl Glycol (SYSTANE OP) Place 1 drop into both eyes 3 (three) times daily as needed (dry/irritated eyes.).     Marland Kitchen potassium chloride SA (KLOR-CON) 20 MEQ tablet TAKE ONE TABLET BY MOUTH TWICE DAILY 180 tablet 3  . pravastatin (PRAVACHOL) 80 MG tablet TAKE ONE TABLET BY MOUTH M/W/F/SAT    . torsemide (DEMADEX) 20 MG tablet Take 20-40 mg by mouth See admin instructions. Take 2 tablets (40 mg) by mouth in the morning & take 1 tablet (20 mg) by mouth at night.    . traZODone (DESYREL) 100 MG tablet 1 tablet at bedtime as needed    . vitamin B-12 (CYANOCOBALAMIN) 1000 MCG tablet Take 1,000 mcg by mouth daily.      No facility-administered medications prior to visit.     Allergies:   Asa [aspirin], Flomax [tamsulosin], Lipitor [atorvastatin], Mirtazapine, Pirfenidone, and Tramadol    Social History   Socioeconomic History  . Marital status: Married    Spouse name: Not on file  . Number of children: 2  . Years of education: Not on file  . Highest education level: Not on file  Occupational History  . 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 Smoking status: Former Smoker    Packs/day: 0.10    Years: 2.00    Pack years: 0.20    Types: Cigarettes    Quit date: 03/20/1955    Years since quitting: 65.2  . Smokeless tobacco: Never Used  Vaping Use  . Vaping Use: Never used  Substance and Sexual Activity  . Alcohol use: No    Alcohol/week: 0.0 standard drinks  . Drug use: No  . Sexual activity: Not Currently  Other Topics Concern  . Not on  file  Social History Narrative  . Not on file   Social Determinants of Health   Financial Resource Strain: Not on file  Food Insecurity: Not on file  Transportation Needs: Not on file  Physical Activity: Not on file  Stress: Not on file  Social Connections: Not on file     Family History:  The patient's family history includes Heart disease in his father; Heart failure in his brother; Stroke in his brother and mother.     ROS:   Please see the history of present illness.    ROS All other systems reviewed and are negative.   PHYSICAL EXAM:   VS:  BP 120/80   Pulse 95   Ht 5\' 9"  (1.753 m)   Wt 177 lb (80.3 kg)   SpO2 96%   BMI 26.14 kg/m    GEN: Well nourished, well developed, in no acute distress HEENT: normal Neck: no JVD or masses Cardiac: irreg irreg.; no murmurs, rubs, or gallops,no edema  Respiratory:  clear to auscultation bilaterally, normal work of breathing GI: soft, nontender, nondistended, + BS MS: no deformity or atrophy Skin: warm and dry, no rash.   Neuro:  Alert and Oriented x 3, Strength and sensation are intact Psych: euthymic mood, full affect   Wt Readings from Last 3 Encounters:  05/19/20 177 lb (80.3 kg)  12/21/19 165 lb 9.6 oz (75.1 kg)  12/03/19 160 lb  (72.6 kg)      Studies/Labs Reviewed:   EKG:  EKG is NOT ordered today.    Recent Labs: 05/22/2019: B Natriuretic Peptide 3,448.0 05/27/2019: Magnesium 1.7 10/16/2019: ALT 11; BUN 48; Creatinine, Ser 1.97; Hemoglobin 12.5; Platelets 85; Potassium 4.5; Sodium 143   Lipid Panel    Component Value Date/Time   CHOL 193 06/17/2017 0744   TRIG 114 06/17/2017 0744   HDL 35 (L) 06/17/2017 0744   CHOLHDL 5.5 (H) 06/17/2017 0744   CHOLHDL 2.6 03/16/2016 0903   VLDL 20 03/16/2016 0903   LDLCALC 135 (H) 06/17/2017 0744    Additional studies/ records that were reviewed today include:  TAVR OPERATIVE NOTE   Date of Procedure:05/26/2019  Preoperative Diagnosis:SevereLow-Flow, Low-GradientAortic Stenosis   Postoperative Diagnosis:Same   Procedure:   Transcatheter Aortic Valve Replacement - PercutaneousRightTransfemoral Approach Edwards Sapien 3 Ultra THV (size 77mm, model # 9600TFX, serial [id number])  Co-Surgeons:Clarence H. Roxy Manns, MD and Sherren Mocha, MD  Anesthesiologist:Kevin Conrad Beaverton, MD  Echocardiographer:Mihai Croitoru, MD  Pre-operative Echo Findings: ? Severe aortic stenosis ? Severeleft ventricular systolic dysfunction  Post-operative Echo Findings: ? Noparavalvular leak ? Unchangedleft ventricular systolic function  ____________________   Echo3/10/21: IMPRESSIONS  1. Left ventricular ejection fraction, by estimation, is 20 to 25%. The  left ventricle has severely decreased function. The left ventricle  demonstrates global hypokinesis. The left ventricular internal cavity size  was moderately dilated. Left ventricular diastolic function could not be evaluated. Left ventricular  diastolic function could not be evaluated.  2. Right ventricular systolic function is moderately reduced. The right  ventricular size is severely enlarged.  There is moderately elevated  pulmonary artery systolic pressure.  3. Left atrial size was moderately dilated.  4. Right atrial size was moderately dilated.  5. The mitral valve has been repaired/replaced. Mild to moderate mitral  valve regurgitation. There is a 28 mm Seguin semirigid ring prosthetic  annuloplasty ring present in the mitral position. Procedure Date:  09/16/2002.  6. Tricuspid valve regurgitation is moderate.  7. The aortic valve has been repaired/replaced. Aortic valve  regurgitation  is not visualized. There is a 29 mm Edwards Sapien  prosthetic (TAVR) valve present in the aortic position. Procedure Date:  05/26/2019. Echo findings are consistent with normal structure and function of the aortic valve prosthesis. Aortic valve area, by VTI measures 4.15 cm. Aortic valve mean gradient measures 4.3 mmHg.  Aortic valve Vmax measures 1.42 m/s.  8. The inferior vena cava is dilated in size with <50% respiratory  variability, suggesting right atrial pressure of 15 mmHg.    ____________________   Echo4/8/21 IMPRESSIONS  1. Left ventricular ejection fraction, by estimation, is <20%. The left ventricle has severely decreased function. The left ventricle demonstrates global hypokinesis. The left ventricular internal cavity size was mildly dilated. Left ventricular  diastolic parameters are indeterminate.  2. S/p mitral valve repair. Mild-moderate mitral regurgitation. Mean gradient 4 mmHg, mild mitral stenosis.  3. Bioprosthetic aortic valve s/p TAVR, 29 mm Edwards Sapien. Trivial perivalvular regurgitation. Mean gradient 4 mmHg, no significant stenosis.  4. Left atrial size was moderately dilated.  5. Right ventricular systolic function is mildly reduced. The right ventricular size is mildly enlarged. There is mildly elevated pulmonary artery systolic pressure. The estimated right ventricular systolic pressure is 40.0 mmHg.  6. The inferior vena cava is normal in size  with <50% respiratory variability, suggesting right atrial pressure of 8 mmHg.  _______________________   Echo 05/19/2020 IMPRESSIONS  1. Left ventricular ejection fraction, by estimation, is 60 to 65%. The  left ventricle has normal function. The left ventricle has no regional  wall motion abnormalities. Left ventricular diastolic function could not  be evaluated. Elevated left  ventricular end-diastolic pressure.  2. Right ventricular systolic function is normal. The right ventricular  size is normal. There is normal pulmonary artery systolic pressure.  3. S/P MV repair with quandrangular resection of the posterior mitral  valve leaflet and placement of neo chord. Mild to moderate mitral valve  regurgitation is present. No evidence of mitral stenosis. The mean MV  gradient is 3.39mmHg.  4. The aortic valve has been repaired/replaced. Aortic valve  regurgitation is not visualized. No aortic stenosis is present. There is a  29 mm Sapien prosthetic (TAVR) valve present in the aortic position.  Procedure Date: 05/26/2019. Aortic valve mean  gradient measures 6.0 mmHg. Aortic valve Vmax measures 1.48 m/s. DI 0.37.  5. The inferior vena cava is normal in size with greater than 50%  respiratory variability, suggesting right atrial pressure of 3 mmHg.   ASSESSMENT & PLAN:   Severe AS s/p TAVR: echo today shows EF 60%, normally functioning TAVR with a mean gradient of 4 mm Hg and no PVL. He has NYHA class II symptoms. He has had a dramatic improvement in symptoms since TAVR. He was essentially wheelchair chair bound preceding his surgery and now able to do all his normal activities with only mild limitation. SBE prophylaxis discussed; he has amoxicillin (which he gets from the dentist). He will continue on Eliquis 2.5mg  BID  Chronic combined S/D CHF: EF has normalized by echo today to 60-65%. Continue on Toprol XL and torsemide at current doing.   CKD stage IV: creat has remained stable  ~1.9-2.3.   Chronic afib: rate well controlled. Now on renally dosed Eliquis.   HTN: BP well controlled today. No changes made.   CAD s/p CABG: pre TAVR cath showednonobstructive CAD with mild diffuse stenosis of the LAD, left circumflex, and RCA. Total occlusion of the first OM branch of the circumflex with continued patency of the saphenous  vein graft to OM.Continue medical therapy. No chest pain.   S/p Mitral valve repair: echo today with mild-moderate MR.    Medication Adjustments/Labs and Tests Ordered: Current medicines are reviewed at length with the patient today.  Concerns regarding medicines are outlined above.  Medication changes, Labs and Tests ordered today are listed in the Patient Instructions below. Patient Instructions  Please call us if you need anything!    Signed, Angelena Form, PA-C  05/19/2020 10:19 PM    [facility] [place], [place], [place]  [address] Phone: [phone]; Fax: [phone]

## 2020-05-19 NOTE — Patient Instructions (Signed)
Please call us if you need anything.

## 2020-05-23 ENCOUNTER — Other Ambulatory Visit: Payer: Self-pay | Admitting: Physician Assistant

## 2020-05-23 DIAGNOSIS — N184 Chronic kidney disease, stage 4 (severe): Secondary | ICD-10-CM

## 2020-05-26 DIAGNOSIS — E7800 Pure hypercholesterolemia, unspecified: Secondary | ICD-10-CM | POA: Diagnosis not present

## 2020-05-26 DIAGNOSIS — N184 Chronic kidney disease, stage 4 (severe): Secondary | ICD-10-CM | POA: Diagnosis not present

## 2020-05-26 DIAGNOSIS — I2510 Atherosclerotic heart disease of native coronary artery without angina pectoris: Secondary | ICD-10-CM | POA: Diagnosis not present

## 2020-05-26 DIAGNOSIS — I5022 Chronic systolic (congestive) heart failure: Secondary | ICD-10-CM | POA: Diagnosis not present

## 2020-05-26 DIAGNOSIS — I501 Left ventricular failure: Secondary | ICD-10-CM | POA: Diagnosis not present

## 2020-05-26 DIAGNOSIS — K219 Gastro-esophageal reflux disease without esophagitis: Secondary | ICD-10-CM | POA: Diagnosis not present

## 2020-05-26 DIAGNOSIS — G4700 Insomnia, unspecified: Secondary | ICD-10-CM | POA: Diagnosis not present

## 2020-05-26 DIAGNOSIS — I10 Essential (primary) hypertension: Secondary | ICD-10-CM | POA: Diagnosis not present

## 2020-05-26 DIAGNOSIS — I480 Paroxysmal atrial fibrillation: Secondary | ICD-10-CM | POA: Diagnosis not present

## 2020-05-26 DIAGNOSIS — E039 Hypothyroidism, unspecified: Secondary | ICD-10-CM | POA: Diagnosis not present

## 2020-05-26 DIAGNOSIS — I5030 Unspecified diastolic (congestive) heart failure: Secondary | ICD-10-CM | POA: Diagnosis not present

## 2020-06-20 ENCOUNTER — Other Ambulatory Visit: Payer: Self-pay

## 2020-06-20 ENCOUNTER — Encounter: Payer: Self-pay | Admitting: Cardiology

## 2020-06-20 ENCOUNTER — Ambulatory Visit: Payer: PPO | Admitting: Cardiology

## 2020-06-20 VITALS — BP 136/72 | HR 84 | Ht 69.0 in | Wt 176.0 lb

## 2020-06-20 DIAGNOSIS — I4821 Permanent atrial fibrillation: Secondary | ICD-10-CM | POA: Diagnosis not present

## 2020-06-20 DIAGNOSIS — Z7901 Long term (current) use of anticoagulants: Secondary | ICD-10-CM

## 2020-06-20 DIAGNOSIS — Z9889 Other specified postprocedural states: Secondary | ICD-10-CM

## 2020-06-20 DIAGNOSIS — N184 Chronic kidney disease, stage 4 (severe): Secondary | ICD-10-CM

## 2020-06-20 DIAGNOSIS — Z952 Presence of prosthetic heart valve: Secondary | ICD-10-CM | POA: Diagnosis not present

## 2020-06-20 DIAGNOSIS — I5042 Chronic combined systolic (congestive) and diastolic (congestive) heart failure: Secondary | ICD-10-CM

## 2020-06-20 MED ORDER — APIXABAN 2.5 MG PO TABS: 2.5000 mg | ORAL_TABLET | Freq: Two times a day (BID) | ORAL | 3 refills | Status: DC

## 2020-06-20 NOTE — Progress Notes (Deleted)
Cardiology Office Note:    Date:  06/20/2020   ID:  [name], DOB [birth year], MRN [id number]  PCP:  [name], MD  Cardiologist:  Buford Dresser, MD  Referring MD: [name], MD   Chief Complaint:  Follow up  History of Present Illness:    [name] is a 87 y.o. male with a hx of CAD s/p CABG, mitral valve repair, atrial fibrillation on anticoagulation, interstitial lung disease, chronic systolic and diastolic heart failure, low flow low gradient aortic stenosis s/p TAVR who is seen in follow up today. He follows at the [facility] in [place].   Today:   Past Medical History:  Diagnosis Date  . Adenomatous colon polyp   . CAD (coronary artery disease)    a. S/P CABG x 1 @ time of MV annuloplasty;  b. 02/2010 ETT: neg for ischemia.  . Carotid artery stenosis    1-39% by dopplers 10/2016  . Chronic combined systolic (congestive) and diastolic (congestive) heart failure ([place])   . CKD (chronic kidney disease), stage III ([place])   . Current use of long term anticoagulation    a. Coumadin in setting of afib.  . Diverticulosis   . Dyslipidemia   . Dysrhythmia    A-fib  . Essential hypertension   . H/O mitral valve repair 09/16/2002   Complex valvuloplasty including quadrangular resection of posterior leaflet with artificial Gore-tex neochords and 28 mm Seguin ring annuloplasty - Dr Servando Snare  . Hemorrhoids   . Hyperlipidemia   . Hypertension   . Permanent atrial fibrillation (HCC)    a. CHA2DS2VASc = 6 -->chronic coumadin;  b. Prev on amio-->d/c 2/2 hypothyroidism. >> resumed 5/16. c. Notes from 2017 indicate interstitial lung disease by pulm appt, question amio toxicity, also increased liver attenuation. Rate control pursued and amiodarone discontinued.  . S/P CABG x 1 09/16/2002   SVG to OM  . 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 Sleep apnea  12/2018   per patient's spouse wears BIPAP "every other night or so"  . Thrombocytopenia ([place])   . TIA (transient ischemic attack)    Past Surgical History:  Procedure Laterality Date  . CATARACT EXTRACTION W/ INTRAOCULAR LENS  IMPLANT, BILATERAL     per patient about 4 years ago  . CORONARY ARTERY BYPASS GRAFT  2004  . ELECTROMAGNETIC NAVIGATION BROCHOSCOPY  10/14/2019   Procedure: NAVIGATION BRONCHOSCOPY;  Surgeon: Garner Nash, DO;  Location: [facility];  Service: Pulmonary;;  . INTERCOSTAL NERVE BLOCK Right 10/14/2019   Procedure: INTERCOSTAL NERVE BLOCK;  Surgeon: Grace Isaac, MD;  Location: [facility];  Service: Thoracic;  Laterality: Right;  . INTRAOPERATIVE TRANSTHORACIC ECHOCARDIOGRAM N/A 05/26/2019   Procedure: INTRAOPERATIVE TRANSTHORACIC ECHOCARDIOGRAM;  Surgeon: Sherren Mocha, MD;  Location: [facility];  Service: Open Heart Surgery;  Laterality: N/A;  . MITRAL VALVE REPAIR    . RIGHT/LEFT HEART CATH AND CORONARY/GRAFT ANGIOGRAPHY N/A 04/28/2019   Procedure: RIGHT/LEFT HEART CATH AND CORONARY/GRAFT ANGIOGRAPHY;  Surgeon: Sherren Mocha, MD;  Location: [facility];  Service: Cardiovascular;  Laterality: N/A;  . SUBMUCOSAL TATTOO INJECTION  10/14/2019   Procedure: ENDOBRONCHIAL DYE INJECTION;  Surgeon: Garner Nash, DO;  Location: [facility];  Service: Pulmonary;;  . TEE WITHOUT CARDIOVERSION N/A 05/20/2019   Procedure: TRANSESOPHAGEAL ECHOCARDIOGRAM (TEE);  Surgeon: Donato Heinz, MD;  Location: [facility];  Service: Cardiovascular;  Laterality: N/A;  . TRANSCATHETER AORTIC VALVE REPLACEMENT, TRANSFEMORAL N/A 05/26/2019   Procedure: TRANSCATHETER AORTIC VALVE REPLACEMENT, TRANSFEMORAL;  Surgeon: Sherren Mocha, MD;  Location: [facility];  Service: Open Heart Surgery;  Laterality: N/A;  . VIDEO BRONCHOSCOPY WITH ENDOBRONCHIAL NAVIGATION N/A 10/14/2019   Procedure: VIDEO BRONCHOSCOPY;  Surgeon: Garner Nash, DO;  Location: [facility];  Service: Pulmonary;   Laterality: N/A;     No outpatient medications have been marked as taking for the 06/20/20 encounter (Appointment) with Buford Dresser, MD.     Allergies:   Asa [aspirin], Flomax [tamsulosin], Lipitor [atorvastatin], Mirtazapine, Pirfenidone, and Tramadol   Social History   Tobacco Use  . Smoking status: Former Smoker    Packs/day: 0.10    Years: 2.00    Pack years: 0.20    Types: Cigarettes    Quit date: 03/20/1955    Years since quitting: 65.2  . Smokeless tobacco: Never Used  Vaping Use  . Vaping Use: Never used  Substance Use Topics  . Alcohol use: No    Alcohol/week: 0.0 standard drinks  . Drug use: No     Family Hx: The patient's family history includes Heart disease in his father; Heart failure in his brother; Stroke in his brother and mother.  ROS:   Please see the history of present illness.    All other systems reviewed and are negative.  Prior CV studies:   The following studies were reviewed today: Echo 10/06/19 1. Left ventricular ejection fraction, by estimation, is 25 to 30%. The  left ventricle has severely decreased function. The left ventricle  demonstrates global hypokinesis. There is mild left ventricular  hypertrophy. Left ventricular diastolic parameters  are indeterminate.  2. Right ventricular systolic function is mildly reduced. The right  ventricular size is normal. There is normal pulmonary artery systolic  pressure. The estimated right ventricular systolic pressure is 50.3 mmHg.  3. Left atrial size was moderately dilated.  4. The mitral valve has been repaired/replaced. Mild mitral valve  regurgitation. No evidence of mitral stenosis. The mean mitral valve  gradient is 2.0 mmHg with average heart rate of 60 bpm. There is a 28 mm  prosthetic annuloplasty ring present in the  mitral position.  5. The aortic valve has been repaired/replaced. Aortic valve  regurgitation is trivial. There is a 29 mm Edwards Edwards Sapien   prosthetic (TAVR) valve present in the aortic position. Echo findings are  consistent with a trivial perivalvular leak of the  aortic prosthesis. Aortic valve peak and mean gradients of 4 and 7 mmHg,  respectively. AVA 2.75 cm2.  6. Aortic dilatation noted. There is borderline dilatation at the level  of the sinuses of Valsalva measuring 38 mm. The ascending aorta measures  normal in size.  7. The inferior vena cava is normal in size with greater than 50%  respiratory variability, suggesting right atrial pressure of 3 mmHg.   Comparison(s): Changes from prior study are noted. 06/25/19: LVEF <20%, TAVR  with P/M gradients of 4 and 6 mmHg, trivial perivalvular leak.   Echo 06/25/19 1. Left ventricular ejection fraction, by estimation, is <20%. The left  ventricle has severely decreased function. The left ventricle demonstrates  global hypokinesis. The left ventricular internal cavity size was mildly  dilated. Left ventricular  diastolic parameters are indeterminate.  2. S/p mitral valve repair. Mild-moderate mitral regurgitation. Mean  gradient 4 mmHg, mild mitral stenosis.  3. Bioprosthetic aortic valve s/p TAVR, 29 mm Edwards Sapien. Trivial  perivalvular regurgitation. Mean  gradient 4 mmHg, no significant stenosis.  4. Left atrial size was moderately dilated.  5. Right ventricular systolic function is mildly reduced. The right  ventricular size is mildly enlarged. There is mildly elevated pulmonary  artery systolic pressure. The estimated right ventricular systolic  pressure is 55.7 mmHg.  6. The inferior vena cava is normal in size with <50% respiratory  variability, suggesting right atrial pressure of 8 mmHg.   Echo 01/13/19  1. Left ventricular ejection fraction, by visual estimation, is 20 to 25%. The left ventricle has severely decreased function. Moderate to severely increased left ventricular size. There is no left ventricular hypertrophy.  2. Left ventricular diastolic  function could not be evaluated pattern of LV diastolic filling.  3. Global right ventricle has normal systolic function.The right ventricular size is mildly enlarged. No increase in right ventricular wall thickness.  4. Left atrial size was moderately dilated.  5. Right atrial size was moderately dilated.  6. Moderate calcification of the mitral valve leaflet(s).  7. Moderate thickening of the mitral valve leaflet(s).  8. Severely decreased mobility of the mitral valve leaflets.  9. The mitral valve has been repaired/replaced. Moderate to severe mitral valve regurgitation. 10. The tricuspid valve is normal in structure. Tricuspid valve regurgitation is mild. 11. The aortic valve is abnormal Aortic valve regurgitation is mild by color flow Doppler. 12. There is Severe calcifcation of the aortic valve. 13. There is Severely thickening of the aortic valve. 14. Suspect low flow-low gradient AS given visual limited movement of aortic valve and severely reduced LV EF. 15. The pulmonic valve was grossly normal. Pulmonic valve regurgitation is mild to moderate by color flow Doppler. 16. Moderately elevated pulmonary artery systolic pressure. 17. The inferior vena cava is normal in size with <50% respiratory variability, suggesting right atrial pressure of 8 mmHg. 18. While heart rate much improved on this study, side by side comparison shows dilation of the LV and stable severely reduced LV EF. The aortic valve is severely thickened/calcified and visually appears to minimally open. Suspect low flow low gradient  severe AS, as dimensionless index 0.18. Prior MV repair with immobile posterior mitral valve leaflet.  Labs/Other Tests and Data Reviewed:    EKG:  An ECG dated 10/12/19 was personally reviewed today and demonstrated:  atrial fibrillation  Recent Labs: 10/16/2019: ALT 11; BUN 48; Creatinine, Ser 1.97; Hemoglobin 12.5; Platelets 85; Potassium 4.5; Sodium 143   Recent Lipid Panel Lab Results   Component Value Date/Time   CHOL 193 06/17/2017 07:44 AM   TRIG 114 06/17/2017 07:44 AM   HDL 35 (L) 06/17/2017 07:44 AM   CHOLHDL 5.5 (H) 06/17/2017 07:44 AM   CHOLHDL 2.6 03/16/2016 09:03 AM   LDLCALC 135 (H) 06/17/2017 07:44 AM    Wt Readings from Last 3 Encounters:  05/19/20 177 lb (80.3 kg)  12/21/19 165 lb 9.6 oz (75.1 kg)  12/03/19 160 lb (72.6 kg)     Objective:    Vital Signs:  There were no vitals taken for this visit.   GEN: Well nourished, well developed in no acute distress HEENT: Normal, moist mucous membranes NECK: No JVD CARDIAC: irregularly irregular rhythm, normal S1 and S2, no rubs or gallops. No murmur appreciated today. VASCULAR: Radial and DP pulses 2+ bilaterally. No carotid bruits RESPIRATORY:  Clear to auscultation with prolonged expiratory phase. Well healed incision sites ABDOMEN: Soft, non-tender, non-distended MUSCULOSKELETAL:  Ambulates independently SKIN: Warm and dry, no edema NEUROLOGIC:  Alert and oriented x 3. No focal  neuro deficits noted. PSYCHIATRIC:  Normal affect   ASSESSMENT & PLAN:    Chronic systolicand diastolicheart failure:systolic failure diagnosed in 12/2017. Pre-TAVR EF <20% -repeat echo with EF 25-30% 09/2019 -intentionally lost weight, has now been stable -reports doing well on torsemide 40 mg in the AM and 20 mg in the PM -continue metoprolol succinate -no room for ACEi/ARB/ARNI/MRA given history of hypotension and CKD, continue to monitor  Atrial Fibrillation, permanent:  -drastically improved rate control since TAVR. Had been extremely difficult to control rate prior. 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 -no chest pain -on pravastatin (see below), no aspirin given apixaban for afib  Hyperlipidemia: -Goal LDL <70, previously LDL 135 in 2019 (not at goal). Followed through [facility] as well -On pravastatin 80 mg. We have discussed intensifying in the past, but given age/comorbidities/lack of symptoms have left alone.  Mitral Regurgitation: h/o MV repair. Mild-moderate on TAVR TEE  Aortic Stenosis, severe, s/p TAVR: feeling the best he has felt in a long time. -has endocarditis prophylaxis ordered  Follow up: 6 mos or sooner as needed  No orders of the defined types were placed in this encounter. No orders of the defined types were placed in this encounter.   There are no Patient Instructions on file for this visit.  Signed, Buford Dresser, MD  06/20/2020  [facility]

## 2020-06-20 NOTE — Patient Instructions (Signed)
Medication Instructions:  Your Physician recommend you continue on your current medication as directed.    *If you need a refill on your cardiac medications before your next appointment, please call your pharmacy*   Lab Work: None   Testing/Procedures: None   Follow-Up: At [facility], you and your health needs are our priority.  As part of our continuing mission to provide you with exceptional heart care, we have created designated Provider Care Teams.  These Care Teams include your primary Cardiologist (physician) and Advanced Practice Providers (APPs -  Physician Assistants and Nurse Practitioners) who all work together to provide you with the care you need, when you need it.  We recommend signing up for the patient portal called "MyChart".  Sign up information is provided on this After Visit Summary.  MyChart is used to connect with patients for Virtual Visits (Telemedicine).  Patients are able to view lab/test results, encounter notes, upcoming appointments, etc.  Non-urgent messages can be sent to your provider as well.   To learn more about what you can do with MyChart, go to [email or web address].    Your next appointment:   6 month(s) @ [address] [place], [place] [address]   The format for your next appointment:   In Person  Provider:   Bridgette Christopher, MD    

## 2020-06-20 NOTE — Progress Notes (Signed)
Cardiology Office Note:    Date:  06/20/2020   ID:  [name], DOB Aug 31, 1931, MRN [id number]  PCP:  [name], MD  Cardiologist:  Buford Dresser, MD  Referring MD: [name], MD   Chief Complaint:  Follow up  History of Present Illness:    [name] is a 87 y.o. male with a hx of CAD s/p CABG, mitral valve repair, atrial fibrillation on anticoagulation, interstitial lung disease, chronic systolic and diastolic heart failure, low flow low gradient aortic stenosis s/p TAVR who is seen in follow up today. He follows at the [facility] in [place].   Today: He is accompanied by his wife at this clinical visit. He is doing well since the last time he was seen here. He has no LE edema, PND, or orthopnea. Has no SOB, chest pain or tightness. He has been walking 1 mile daily after his procedure, and denies any exertional SOB or chest pain. He ask the hierarchy of vegetables to eat (fresh, frozen, vs canned). He has no blood in his stool. His wife request for his 2.5 mg of Apixaban to be switched to [facility] rather than the [facility].   Past Medical History:  Diagnosis Date  . Adenomatous colon polyp   . CAD (coronary artery disease)    a. S/P CABG x 1 @ time of MV annuloplasty;  b. 02/2010 ETT: neg for ischemia.  . Carotid artery stenosis    1-39% by dopplers 10/2016  . Chronic combined systolic (congestive) and diastolic (congestive) heart failure ([place])   . CKD (chronic kidney disease), stage III ([place])   . Current use of long term anticoagulation    a. Coumadin in setting of afib.  . Diverticulosis   . Dyslipidemia   . Dysrhythmia    A-fib  . Essential hypertension   . H/O mitral valve repair 09/16/2002   Complex valvuloplasty including quadrangular resection of posterior leaflet with artificial Gore-tex neochords and 28 mm Seguin ring annuloplasty - Dr Servando Snare  . Hemorrhoids   . Hyperlipidemia   . Hypertension   . Permanent atrial fibrillation (HCC)    a.  CHA2DS2VASc = 6 -->chronic coumadin;  b. Prev on amio-->d/c 2/2 hypothyroidism. >> resumed 5/16. c. Notes from 2017 indicate interstitial lung disease by pulm appt, question amio toxicity, also increased liver attenuation. Rate control pursued and amiodarone discontinued.  . S/P CABG x 1 09/16/2002   SVG to OM  . 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 Sleep apnea 12/2018   per patient's spouse wears BIPAP "every other night or so"  . Thrombocytopenia ([place])   . TIA (transient ischemic attack)    Past Surgical History:  Procedure Laterality Date  . CATARACT EXTRACTION W/ INTRAOCULAR LENS  IMPLANT, BILATERAL     per patient about 4 years ago  . CORONARY ARTERY BYPASS GRAFT  2004  . ELECTROMAGNETIC NAVIGATION BROCHOSCOPY  10/14/2019   Procedure: NAVIGATION BRONCHOSCOPY;  Surgeon: Garner Nash, DO;  Location: [facility];  Service: Pulmonary;;  . INTERCOSTAL NERVE BLOCK Right 10/14/2019   Procedure: INTERCOSTAL NERVE BLOCK;  Surgeon: Grace Isaac, MD;  Location: [facility];  Service: Thoracic;  Laterality: Right;  . INTRAOPERATIVE TRANSTHORACIC ECHOCARDIOGRAM N/A 05/26/2019   Procedure: INTRAOPERATIVE TRANSTHORACIC ECHOCARDIOGRAM;  Surgeon: Sherren Mocha, MD;  Location: [facility];  Service: Open Heart Surgery;  Laterality: N/A;  .  MITRAL VALVE REPAIR    . RIGHT/LEFT HEART CATH AND CORONARY/GRAFT ANGIOGRAPHY N/A 04/28/2019   Procedure: RIGHT/LEFT HEART CATH AND CORONARY/GRAFT ANGIOGRAPHY;  Surgeon: Sherren Mocha, MD;  Location: [facility];  Service: Cardiovascular;  Laterality: N/A;  . SUBMUCOSAL TATTOO INJECTION  10/14/2019   Procedure: ENDOBRONCHIAL DYE INJECTION;  Surgeon: Garner Nash, DO;  Location: [facility];  Service: Pulmonary;;  . TEE WITHOUT CARDIOVERSION N/A 05/20/2019   Procedure: TRANSESOPHAGEAL ECHOCARDIOGRAM (TEE);  Surgeon: Donato Heinz, MD;   Location: [facility];  Service: Cardiovascular;  Laterality: N/A;  . TRANSCATHETER AORTIC VALVE REPLACEMENT, TRANSFEMORAL N/A 05/26/2019   Procedure: TRANSCATHETER AORTIC VALVE REPLACEMENT, TRANSFEMORAL;  Surgeon: Sherren Mocha, MD;  Location: [facility];  Service: Open Heart Surgery;  Laterality: N/A;  . VIDEO BRONCHOSCOPY WITH ENDOBRONCHIAL NAVIGATION N/A 10/14/2019   Procedure: VIDEO BRONCHOSCOPY;  Surgeon: Garner Nash, DO;  Location: [facility];  Service: Pulmonary;  Laterality: N/A;     Current Meds  Medication Sig  . apixaban (ELIQUIS) 2.5 MG TABS tablet Take 1 tablet (2.5 mg total) by mouth 2 (two) times daily.  . feeding supplement, ENSURE ENLIVE, (ENSURE ENLIVE) LIQD Take 237 mLs by mouth 2 (two) times daily between meals.  . hydrocortisone cream 1 % 1 application to affected area as needed for hemorrhoids  . levothyroxine (SYNTHROID) 75 MCG tablet Take 75 mcg by mouth daily before breakfast.   . metoprolol succinate (TOPROL-XL) 50 MG 24 hr tablet TAKE ONE TABLET BY MOUTH ONE TIME DAILY  . Polyethyl Glycol-Propyl Glycol (SYSTANE OP) Place 1 drop into both eyes 3 (three) times daily as needed (dry/irritated eyes.).   Marland Kitchen potassium chloride SA (KLOR-CON) 20 MEQ tablet TAKE ONE TABLET BY MOUTH TWICE DAILY  . pravastatin (PRAVACHOL) 80 MG tablet TAKE ONE TABLET BY MOUTH M/W/F/SAT  . torsemide (DEMADEX) 20 MG tablet Take 20-40 mg by mouth See admin instructions. Take 2 tablets (40 mg) by mouth in the morning & take 1 tablet (20 mg) by mouth at night.  . traZODone (DESYREL) 100 MG tablet 1 tablet at bedtime as needed  . vitamin B-12 (CYANOCOBALAMIN) 1000 MCG tablet Take 1,000 mcg by mouth daily.      Allergies:   Asa [aspirin], Flomax [tamsulosin], Lipitor [atorvastatin], Mirtazapine, Pirfenidone, and Tramadol   Social History   Tobacco Use  . Smoking status: Former Smoker    Packs/day: 0.10    Years: 2.00    Pack years: 0.20    Types: Cigarettes    Quit date: 03/20/1955    Years  since quitting: 65.2  . Smokeless tobacco: Never Used  Vaping Use  . Vaping Use: Never used  Substance Use Topics  . Alcohol use: No    Alcohol/week: 0.0 standard drinks  . Drug use: No     Family Hx: The patient's family history includes Heart disease in his father; Heart failure in his brother; Stroke in his brother and mother.  ROS:   Please see the history of present illness.    All other systems reviewed and are negative.  Prior CV studies:   The following studies were reviewed today: Echo 05/19/20 1. Left ventricular ejection fraction, by estimation, is 60 to 65%. The  left ventricle has normal function. The left ventricle has no regional  wall motion abnormalities. Left ventricular diastolic function could not  be evaluated. Elevated left  ventricular end-diastolic pressure.  2. Right ventricular systolic function is normal. The right ventricular  size is normal. There is normal pulmonary artery  systolic pressure.  3. S/P MV repair with quandrangular resection of the posterior mitral  valve leaflet and placement of neo chord. Mild to moderate mitral valve  regurgitation is present. No evidence of mitral stenosis. The mean MV  gradient is 3.26mmHg.  4. The aortic valve has been repaired/replaced. Aortic valve  regurgitation is not visualized. No aortic stenosis is present. There is a  29 mm Sapien prosthetic (TAVR) valve present in the aortic position.  Procedure Date: 05/26/2019. Aortic valve mean  gradient measures 6.0 mmHg. Aortic valve Vmax measures 1.48 m/s. DI 0.37.  5. The inferior vena cava is normal in size with greater than 50%  respiratory variability, suggesting right atrial pressure of 3 mmHg.   Echo 10/06/19 1. Left ventricular ejection fraction, by estimation, is 25 to 30%. The  left ventricle has severely decreased function. The left ventricle  demonstrates global hypokinesis. There is mild left ventricular  hypertrophy. Left ventricular diastolic  parameters  are indeterminate.  2. Right ventricular systolic function is mildly reduced. The right  ventricular size is normal. There is normal pulmonary artery systolic  pressure. The estimated right ventricular systolic pressure is 72.6 mmHg.  3. Left atrial size was moderately dilated.  4. The mitral valve has been repaired/replaced. Mild mitral valve  regurgitation. No evidence of mitral stenosis. The mean mitral valve  gradient is 2.0 mmHg with average heart rate of 60 bpm. There is a 28 mm  prosthetic annuloplasty ring present in the  mitral position.  5. The aortic valve has been repaired/replaced. Aortic valve  regurgitation is trivial. There is a 29 mm Edwards Edwards Sapien  prosthetic (TAVR) valve present in the aortic position. Echo findings are  consistent with a trivial perivalvular leak of the  aortic prosthesis. Aortic valve peak and mean gradients of 4 and 7 mmHg,  respectively. AVA 2.75 cm2.  6. Aortic dilatation noted. There is borderline dilatation at the level  of the sinuses of Valsalva measuring 38 mm. The ascending aorta measures  normal in size.  7. The inferior vena cava is normal in size with greater than 50%  respiratory variability, suggesting right atrial pressure of 3 mmHg.   Comparison(s): Changes from prior study are noted. 06/25/19: LVEF <20%, TAVR  with P/M gradients of 4 and 6 mmHg, trivial perivalvular leak.   Echo 06/25/19 1. Left ventricular ejection fraction, by estimation, is <20%. The left  ventricle has severely decreased function. The left ventricle demonstrates  global hypokinesis. The left ventricular internal cavity size was mildly  dilated. Left ventricular  diastolic parameters are indeterminate.  2. S/p mitral valve repair. Mild-moderate mitral regurgitation. Mean  gradient 4 mmHg, mild mitral stenosis.  3. Bioprosthetic aortic valve s/p TAVR, 29 mm Edwards Sapien. Trivial  perivalvular regurgitation. Mean gradient 4 mmHg, no  significant stenosis.  4. Left atrial size was moderately dilated.  5. Right ventricular systolic function is mildly reduced. The right  ventricular size is mildly enlarged. There is mildly elevated pulmonary  artery systolic pressure. The estimated right ventricular systolic  pressure is 20.3 mmHg.  6. The inferior vena cava is normal in size with <50% respiratory  variability, suggesting right atrial pressure of 8 mmHg.   Echo 01/13/19  1. Left ventricular ejection fraction, by visual estimation, is 20 to 25%. The left ventricle has severely decreased function. Moderate to severely increased left ventricular size. There is no left ventricular hypertrophy.  2. Left ventricular diastolic function could not be evaluated pattern of LV diastolic filling.  3. Global right ventricle has normal systolic function.The right ventricular size is mildly enlarged. No increase in right ventricular wall thickness.  4. Left atrial size was moderately dilated.  5. Right atrial size was moderately dilated.  6. Moderate calcification of the mitral valve leaflet(s).  7. Moderate thickening of the mitral valve leaflet(s).  8. Severely decreased mobility of the mitral valve leaflets.  9. The mitral valve has been repaired/replaced. Moderate to severe mitral valve regurgitation. 10. The tricuspid valve is normal in structure. Tricuspid valve regurgitation is mild. 11. The aortic valve is abnormal Aortic valve regurgitation is mild by color flow Doppler. 12. There is Severe calcifcation of the aortic valve. 13. There is Severely thickening of the aortic valve. 14. Suspect low flow-low gradient AS given visual limited movement of aortic valve and severely reduced LV EF. 15. The pulmonic valve was grossly normal. Pulmonic valve regurgitation is mild to moderate by color flow Doppler. 16. Moderately elevated pulmonary artery systolic pressure. 17. The inferior vena cava is normal in size with <50% respiratory  variability, suggesting right atrial pressure of 8 mmHg. 18. While heart rate much improved on this study, side by side comparison shows dilation of the LV and stable severely reduced LV EF. The aortic valve is severely thickened/calcified and visually appears to minimally open. Suspect low flow low gradient  severe AS, as dimensionless index 0.18. Prior MV repair with immobile posterior mitral valve leaflet.  Labs/Other Tests and Data Reviewed:    EKG: Personally reviewed today 4/22- Atrial fibrillation, rate 84 7/21- Atrial fibrillation   Recent Labs: 10/16/2019: ALT 11; BUN 48; Creatinine, Ser 1.97; Hemoglobin 12.5; Platelets 85; Potassium 4.5; Sodium 143   Recent Lipid Panel Lab Results  Component Value Date/Time   CHOL 193 06/17/2017 07:44 AM   TRIG 114 06/17/2017 07:44 AM   HDL 35 (L) 06/17/2017 07:44 AM   CHOLHDL 5.5 (H) 06/17/2017 07:44 AM   CHOLHDL 2.6 03/16/2016 09:03 AM   LDLCALC 135 (H) 06/17/2017 07:44 AM    Wt Readings from Last 3 Encounters:  06/20/20 176 lb (79.8 kg)  05/19/20 177 lb (80.3 kg)  12/21/19 165 lb 9.6 oz (75.1 kg)     Objective:    Vital Signs:  BP 136/72   Pulse 84   Ht 5\' 9"  (1.753 m)   Wt 176 lb (79.8 kg)   SpO2 96%   BMI 25.99 kg/m    GEN: Well nourished, well developed in no acute distress HEENT: Normal, moist mucous membranes NECK: No JVD CARDIAC: irregularly irregular rhythm, normal S1 and S2, no rubs or gallops. No murmur appreciated today. VASCULAR: Radial and DP pulses 2+ bilaterally. No carotid bruits RESPIRATORY:  Clear to auscultation with prolonged expiratory phase. Well healed incision sites ABDOMEN: Soft, non-tender, non-distended MUSCULOSKELETAL:  Ambulates independently SKIN: Warm and dry, no edema NEUROLOGIC:  Alert and oriented x 3. No focal neuro deficits noted. PSYCHIATRIC:  Normal affect   ASSESSMENT & PLAN:    1. Permanent atrial fibrillation   2. Chronic combined systolic and diastolic congestive heart failure  ([place])   3. S/P TAVR (transcatheter aortic valve replacement)   4. CKD (chronic kidney disease) stage 4, GFR 15-29 ml/min (HCC)   5. Long term (current) use of anticoagulants [Z79.01]   6. H/O mitral valve repair    Chronic systolicand diastolicheart failure:systolic failure diagnosed in 12/2017. Pre-TAVR EF <20% -repeat echo with EF 25-30% 09/2019 -echo 05/19/20 reviewed today, reported as EF 60-65%. While it does not appear quite that  vigorous to me, it is significantly improved from prior echo -reports doing well on torsemide 40 mg in the AM and 20 mg in the PM -continue metoprolol succinate -no room for ACEi/ARB/ARNI/MRA given history of hypotension and CKD, continue to monitor  Atrial Fibrillation, permanent:  -drastically improved rate control since TAVR. Had been extremely difficult to control rate prior. it -tolerating metoprolol succinate 50 mg daily, increased dose leads to worsening hypotension -not controlled on amiodarone in the past (and with liver/lung pathology, would avoid), no diltiazem given low EF, discussed digoxin in the past but now rate controlled -tolerating apixaban 2.5 mg BID (reduced for age, Cr). 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 -no chest pain -on pravastatin (see below), no aspirin given apixaban for afib  Hyperlipidemia: -Goal LDL <70 -lipids per [facility] 05/06/2020: Tchol 217, HDL 50, LDL 144, TG 130 -On pravastatin 80 mg. We have discussed intensifying in the past, but given age/comorbidities/lack of symptoms have not changed per patient preference.  Mitral Regurgitation: h/o MV repair. Mild-moderate on TAVR TEE  Aortic Stenosis, severe, s/p TAVR:   -has endocarditis prophylaxis ordered  Follow up: 6 months or sooner as needed  Orders Placed This Encounter  Procedures  . EKG 12-Lead   Meds ordered this encounter  Medications  . apixaban (ELIQUIS) 2.5 MG TABS tablet    Sig: Take 1 tablet (2.5 mg total) by mouth 2 (two) times daily.    Dispense:  180 tablet    Refill:  3    Patient Instructions  Medication Instructions:  Your Physician recommend you continue on your current medication as directed.    *If you need a refill on your cardiac medications before your next appointment, please call your pharmacy*   Lab Work: None   Testing/Procedures: None   Follow-Up: At [facility], you and your health needs are our priority.  As part of our continuing mission to provide you with exceptional heart care, we have created designated Provider Care Teams.  These Care Teams include your primary Cardiologist (physician) and Advanced Practice Providers (APPs -  Physician Assistants and Nurse Practitioners) who all work together to provide you with the care you need, when you need it.  We recommend signing up for the patient portal called "MyChart".  Sign up information is provided on this After Visit Summary.  MyChart is used to connect with patients for Virtual Visits (Telemedicine).  Patients are able to view lab/test results, encounter notes, upcoming appointments, etc.  Non-urgent messages can be sent to your provider as well.   To learn more about what you can do with MyChart, go to [email or web address].    Your next appointment:   6 month(s) @ [address] [place], [place] [address]   The format for your next appointment:   In Person  Provider:   Buford Dresser, MD     I,Alexis Bryant,acting as a scribe for Buford Dresser, MD.,have documented all relevant documentation on the behalf of Buford Dresser, MD,as directed by  Buford Dresser, MD while in the presence of Buford Dresser, MD.  Signed, Buford Dresser, MD  06/20/2020  [facility]

## 2020-06-21 ENCOUNTER — Encounter: Payer: Self-pay | Admitting: Cardiology

## 2020-07-06 DIAGNOSIS — K219 Gastro-esophageal reflux disease without esophagitis: Secondary | ICD-10-CM | POA: Diagnosis not present

## 2020-07-06 DIAGNOSIS — I10 Essential (primary) hypertension: Secondary | ICD-10-CM | POA: Diagnosis not present

## 2020-07-06 DIAGNOSIS — E7800 Pure hypercholesterolemia, unspecified: Secondary | ICD-10-CM | POA: Diagnosis not present

## 2020-07-06 DIAGNOSIS — I2510 Atherosclerotic heart disease of native coronary artery without angina pectoris: Secondary | ICD-10-CM | POA: Diagnosis not present

## 2020-07-06 DIAGNOSIS — G4700 Insomnia, unspecified: Secondary | ICD-10-CM | POA: Diagnosis not present

## 2020-07-06 DIAGNOSIS — I5030 Unspecified diastolic (congestive) heart failure: Secondary | ICD-10-CM | POA: Diagnosis not present

## 2020-07-06 DIAGNOSIS — N184 Chronic kidney disease, stage 4 (severe): Secondary | ICD-10-CM | POA: Diagnosis not present

## 2020-07-06 DIAGNOSIS — E039 Hypothyroidism, unspecified: Secondary | ICD-10-CM | POA: Diagnosis not present

## 2020-07-06 DIAGNOSIS — I480 Paroxysmal atrial fibrillation: Secondary | ICD-10-CM | POA: Diagnosis not present

## 2020-07-15 DIAGNOSIS — I10 Essential (primary) hypertension: Secondary | ICD-10-CM | POA: Diagnosis not present

## 2020-08-09 DIAGNOSIS — N184 Chronic kidney disease, stage 4 (severe): Secondary | ICD-10-CM | POA: Diagnosis not present

## 2020-08-09 DIAGNOSIS — I5030 Unspecified diastolic (congestive) heart failure: Secondary | ICD-10-CM | POA: Diagnosis not present

## 2020-08-09 DIAGNOSIS — I5022 Chronic systolic (congestive) heart failure: Secondary | ICD-10-CM | POA: Diagnosis not present

## 2020-08-09 DIAGNOSIS — I480 Paroxysmal atrial fibrillation: Secondary | ICD-10-CM | POA: Diagnosis not present

## 2020-08-09 DIAGNOSIS — N183 Chronic kidney disease, stage 3 unspecified: Secondary | ICD-10-CM | POA: Diagnosis not present

## 2020-08-09 DIAGNOSIS — I10 Essential (primary) hypertension: Secondary | ICD-10-CM | POA: Diagnosis not present

## 2020-08-09 DIAGNOSIS — E7800 Pure hypercholesterolemia, unspecified: Secondary | ICD-10-CM | POA: Diagnosis not present

## 2020-08-09 DIAGNOSIS — G4700 Insomnia, unspecified: Secondary | ICD-10-CM | POA: Diagnosis not present

## 2020-08-09 DIAGNOSIS — I501 Left ventricular failure: Secondary | ICD-10-CM | POA: Diagnosis not present

## 2020-08-09 DIAGNOSIS — I2510 Atherosclerotic heart disease of native coronary artery without angina pectoris: Secondary | ICD-10-CM | POA: Diagnosis not present

## 2020-08-09 DIAGNOSIS — E039 Hypothyroidism, unspecified: Secondary | ICD-10-CM | POA: Diagnosis not present

## 2020-08-09 DIAGNOSIS — K219 Gastro-esophageal reflux disease without esophagitis: Secondary | ICD-10-CM | POA: Diagnosis not present

## 2020-08-12 DIAGNOSIS — E261 Secondary hyperaldosteronism: Secondary | ICD-10-CM | POA: Diagnosis not present

## 2020-08-12 DIAGNOSIS — D692 Other nonthrombocytopenic purpura: Secondary | ICD-10-CM | POA: Diagnosis not present

## 2020-08-12 DIAGNOSIS — I5040 Unspecified combined systolic (congestive) and diastolic (congestive) heart failure: Secondary | ICD-10-CM | POA: Diagnosis not present

## 2020-08-12 DIAGNOSIS — N184 Chronic kidney disease, stage 4 (severe): Secondary | ICD-10-CM | POA: Diagnosis not present

## 2020-08-16 DIAGNOSIS — I10 Essential (primary) hypertension: Secondary | ICD-10-CM | POA: Diagnosis not present

## 2020-08-19 DIAGNOSIS — N184 Chronic kidney disease, stage 4 (severe): Secondary | ICD-10-CM | POA: Diagnosis not present

## 2020-08-19 DIAGNOSIS — I501 Left ventricular failure: Secondary | ICD-10-CM | POA: Diagnosis not present

## 2020-08-19 DIAGNOSIS — E039 Hypothyroidism, unspecified: Secondary | ICD-10-CM | POA: Diagnosis not present

## 2020-08-19 DIAGNOSIS — N183 Chronic kidney disease, stage 3 unspecified: Secondary | ICD-10-CM | POA: Diagnosis not present

## 2020-08-19 DIAGNOSIS — I2510 Atherosclerotic heart disease of native coronary artery without angina pectoris: Secondary | ICD-10-CM | POA: Diagnosis not present

## 2020-08-19 DIAGNOSIS — I480 Paroxysmal atrial fibrillation: Secondary | ICD-10-CM | POA: Diagnosis not present

## 2020-08-19 DIAGNOSIS — G4700 Insomnia, unspecified: Secondary | ICD-10-CM | POA: Diagnosis not present

## 2020-08-19 DIAGNOSIS — I10 Essential (primary) hypertension: Secondary | ICD-10-CM | POA: Diagnosis not present

## 2020-08-19 DIAGNOSIS — K219 Gastro-esophageal reflux disease without esophagitis: Secondary | ICD-10-CM | POA: Diagnosis not present

## 2020-08-19 DIAGNOSIS — E7800 Pure hypercholesterolemia, unspecified: Secondary | ICD-10-CM | POA: Diagnosis not present

## 2020-08-19 DIAGNOSIS — I5022 Chronic systolic (congestive) heart failure: Secondary | ICD-10-CM | POA: Diagnosis not present

## 2020-08-19 DIAGNOSIS — I5030 Unspecified diastolic (congestive) heart failure: Secondary | ICD-10-CM | POA: Diagnosis not present

## 2020-08-25 ENCOUNTER — Other Ambulatory Visit (HOSPITAL_COMMUNITY): Payer: Self-pay | Admitting: Nephrology

## 2020-08-25 DIAGNOSIS — I10 Essential (primary) hypertension: Secondary | ICD-10-CM

## 2020-08-25 DIAGNOSIS — E785 Hyperlipidemia, unspecified: Secondary | ICD-10-CM | POA: Diagnosis not present

## 2020-08-25 DIAGNOSIS — N184 Chronic kidney disease, stage 4 (severe): Secondary | ICD-10-CM

## 2020-08-25 DIAGNOSIS — D631 Anemia in chronic kidney disease: Secondary | ICD-10-CM | POA: Diagnosis not present

## 2020-08-25 DIAGNOSIS — I129 Hypertensive chronic kidney disease with stage 1 through stage 4 chronic kidney disease, or unspecified chronic kidney disease: Secondary | ICD-10-CM | POA: Diagnosis not present

## 2020-08-25 DIAGNOSIS — N2581 Secondary hyperparathyroidism of renal origin: Secondary | ICD-10-CM | POA: Diagnosis not present

## 2020-08-25 DIAGNOSIS — I509 Heart failure, unspecified: Secondary | ICD-10-CM | POA: Diagnosis not present

## 2020-09-06 ENCOUNTER — Other Ambulatory Visit: Payer: Self-pay

## 2020-09-06 ENCOUNTER — Ambulatory Visit (HOSPITAL_COMMUNITY)
Admission: RE | Admit: 2020-09-06 | Discharge: 2020-09-06 | Disposition: A | Discharge status: Home / Self Care | Payer: PPO | Source: Ambulatory Visit | Attending: Nephrology | Admitting: Nephrology

## 2020-09-06 ENCOUNTER — Ambulatory Visit (HOSPITAL_BASED_OUTPATIENT_CLINIC_OR_DEPARTMENT_OTHER)
Admission: RE | Admit: 2020-09-06 | Discharge: 2020-09-06 | Disposition: A | Discharge status: Home / Self Care | Payer: PPO | Source: Ambulatory Visit | Attending: Nephrology | Admitting: Nephrology

## 2020-09-06 DIAGNOSIS — I10 Essential (primary) hypertension: Secondary | ICD-10-CM

## 2020-09-06 DIAGNOSIS — N184 Chronic kidney disease, stage 4 (severe): Secondary | ICD-10-CM

## 2020-09-06 DIAGNOSIS — R188 Other ascites: Secondary | ICD-10-CM | POA: Diagnosis not present

## 2020-09-06 NOTE — Progress Notes (Signed)
Renal artery duplex has been completed.   Results can be found under chart review under CV PROC. 09/06/2020 3:21 PM Jourdan Maldonado RVT, RDMS

## 2020-09-14 DIAGNOSIS — I10 Essential (primary) hypertension: Secondary | ICD-10-CM | POA: Diagnosis not present

## 2020-10-13 DIAGNOSIS — I10 Essential (primary) hypertension: Secondary | ICD-10-CM | POA: Diagnosis not present

## 2020-10-14 DIAGNOSIS — N184 Chronic kidney disease, stage 4 (severe): Secondary | ICD-10-CM | POA: Diagnosis not present

## 2020-11-15 DIAGNOSIS — I5030 Unspecified diastolic (congestive) heart failure: Secondary | ICD-10-CM | POA: Diagnosis not present

## 2020-11-15 DIAGNOSIS — I5022 Chronic systolic (congestive) heart failure: Secondary | ICD-10-CM | POA: Diagnosis not present

## 2020-11-15 DIAGNOSIS — I10 Essential (primary) hypertension: Secondary | ICD-10-CM | POA: Diagnosis not present

## 2020-11-15 DIAGNOSIS — I480 Paroxysmal atrial fibrillation: Secondary | ICD-10-CM | POA: Diagnosis not present

## 2020-11-15 DIAGNOSIS — G4700 Insomnia, unspecified: Secondary | ICD-10-CM | POA: Diagnosis not present

## 2020-11-15 DIAGNOSIS — E039 Hypothyroidism, unspecified: Secondary | ICD-10-CM | POA: Diagnosis not present

## 2020-11-15 DIAGNOSIS — K219 Gastro-esophageal reflux disease without esophagitis: Secondary | ICD-10-CM | POA: Diagnosis not present

## 2020-11-15 DIAGNOSIS — I2510 Atherosclerotic heart disease of native coronary artery without angina pectoris: Secondary | ICD-10-CM | POA: Diagnosis not present

## 2020-11-15 DIAGNOSIS — N184 Chronic kidney disease, stage 4 (severe): Secondary | ICD-10-CM | POA: Diagnosis not present

## 2020-11-15 DIAGNOSIS — E7800 Pure hypercholesterolemia, unspecified: Secondary | ICD-10-CM | POA: Diagnosis not present

## 2020-11-24 DIAGNOSIS — N184 Chronic kidney disease, stage 4 (severe): Secondary | ICD-10-CM | POA: Diagnosis not present

## 2020-11-24 DIAGNOSIS — N2581 Secondary hyperparathyroidism of renal origin: Secondary | ICD-10-CM | POA: Diagnosis not present

## 2020-11-24 DIAGNOSIS — D631 Anemia in chronic kidney disease: Secondary | ICD-10-CM | POA: Diagnosis not present

## 2020-11-24 DIAGNOSIS — E875 Hyperkalemia: Secondary | ICD-10-CM | POA: Diagnosis not present

## 2020-11-24 DIAGNOSIS — I129 Hypertensive chronic kidney disease with stage 1 through stage 4 chronic kidney disease, or unspecified chronic kidney disease: Secondary | ICD-10-CM | POA: Diagnosis not present

## 2020-11-24 DIAGNOSIS — Z23 Encounter for immunization: Secondary | ICD-10-CM | POA: Diagnosis not present

## 2020-11-30 DIAGNOSIS — E7800 Pure hypercholesterolemia, unspecified: Secondary | ICD-10-CM | POA: Diagnosis not present

## 2020-11-30 DIAGNOSIS — G4700 Insomnia, unspecified: Secondary | ICD-10-CM | POA: Diagnosis not present

## 2020-11-30 DIAGNOSIS — N183 Chronic kidney disease, stage 3 unspecified: Secondary | ICD-10-CM | POA: Diagnosis not present

## 2020-11-30 DIAGNOSIS — I5022 Chronic systolic (congestive) heart failure: Secondary | ICD-10-CM | POA: Diagnosis not present

## 2020-11-30 DIAGNOSIS — E039 Hypothyroidism, unspecified: Secondary | ICD-10-CM | POA: Diagnosis not present

## 2020-11-30 DIAGNOSIS — I10 Essential (primary) hypertension: Secondary | ICD-10-CM | POA: Diagnosis not present

## 2020-11-30 DIAGNOSIS — I480 Paroxysmal atrial fibrillation: Secondary | ICD-10-CM | POA: Diagnosis not present

## 2020-11-30 DIAGNOSIS — K219 Gastro-esophageal reflux disease without esophagitis: Secondary | ICD-10-CM | POA: Diagnosis not present

## 2020-11-30 DIAGNOSIS — I5030 Unspecified diastolic (congestive) heart failure: Secondary | ICD-10-CM | POA: Diagnosis not present

## 2020-11-30 DIAGNOSIS — I2510 Atherosclerotic heart disease of native coronary artery without angina pectoris: Secondary | ICD-10-CM | POA: Diagnosis not present

## 2020-12-27 ENCOUNTER — Encounter (HOSPITAL_BASED_OUTPATIENT_CLINIC_OR_DEPARTMENT_OTHER): Payer: Self-pay | Admitting: Cardiology

## 2020-12-27 ENCOUNTER — Other Ambulatory Visit: Payer: Self-pay

## 2020-12-27 ENCOUNTER — Ambulatory Visit (HOSPITAL_BASED_OUTPATIENT_CLINIC_OR_DEPARTMENT_OTHER): Payer: PPO | Admitting: Cardiology

## 2020-12-27 VITALS — BP 144/77 | HR 71 | Ht 68.0 in | Wt 163.0 lb

## 2020-12-27 DIAGNOSIS — I2510 Atherosclerotic heart disease of native coronary artery without angina pectoris: Secondary | ICD-10-CM

## 2020-12-27 DIAGNOSIS — Z952 Presence of prosthetic heart valve: Secondary | ICD-10-CM | POA: Diagnosis not present

## 2020-12-27 DIAGNOSIS — N184 Chronic kidney disease, stage 4 (severe): Secondary | ICD-10-CM | POA: Diagnosis not present

## 2020-12-27 DIAGNOSIS — I5042 Chronic combined systolic (congestive) and diastolic (congestive) heart failure: Secondary | ICD-10-CM

## 2020-12-27 DIAGNOSIS — Z7901 Long term (current) use of anticoagulants: Secondary | ICD-10-CM

## 2020-12-27 DIAGNOSIS — I4821 Permanent atrial fibrillation: Secondary | ICD-10-CM

## 2020-12-27 DIAGNOSIS — Z9889 Other specified postprocedural states: Secondary | ICD-10-CM

## 2020-12-27 NOTE — Progress Notes (Signed)
Cardiology Office Note:    Date:  12/27/2020   ID:  [name], DOB December 15, 1931, MRN [id number]  PCP:  [name], MD  Cardiologist:  Buford Dresser, MD  Referring MD: [name], MD   Chief Complaint:  Follow up  History of Present Illness:    [name] is a 87 y.o. male with a hx of CAD s/p CABG, mitral valve repair, atrial fibrillation on anticoagulation, interstitial lung disease, chronic systolic and diastolic heart failure, low flow low gradient aortic stenosis s/p TAVR who is seen in follow up today. He follows at the [facility] in [place].   Today: He is accompanied by his wife. Overall he states he is feeling normal, and is about the same as he felt a year ago. He often enjoys sitting on the back porch in the sun. His wife notes that he has "slowed down a little bit," and he states he would like to have a little more energy.  He presents a blood pressure log, which shows an at home range from 103/60 to 125/66, as well as a stable heart rate.  Lately, his breathing is more improved than it was previously.  Per his nephrologist his kidney function has worsened. He is now avoiding foods with potassium, and is avoiding protein drinks.  Every once in a great while (3-4 months) he has a short episode of hematochezia due to an internal hemorrhoid.   He denies any palpitations, chest pain. No lightheadedness, headaches, syncope, orthopnea, or PND. Also has no lower extremity edema or exertional symptoms.   Past Medical History:  Diagnosis Date   Adenomatous colon polyp    CAD (coronary artery disease)    a. S/P CABG x 1 @ time of MV annuloplasty;  b. 02/2010 ETT: neg for ischemia.   Carotid artery stenosis    1-39% by dopplers 10/2016   Chronic combined systolic (congestive) and diastolic (congestive) heart failure (HCC)    CKD (chronic kidney disease), stage III ([place])    Current use of long term anticoagulation    a. Coumadin in setting of afib.    Diverticulosis    Dyslipidemia    Dysrhythmia    A-fib   Essential hypertension    H/O mitral valve repair 09/16/2002   Complex valvuloplasty including quadrangular resection of posterior leaflet with artificial Gore-tex neochords and 28 mm Seguin ring annuloplasty - Dr Servando Snare   Hemorrhoids    Hyperlipidemia    Hypertension    Permanent atrial fibrillation ([place])    a. CHA2DS2VASc = 6 -->chronic coumadin;  b. Prev on amio-->d/c 2/2 hypothyroidism. >> resumed 5/16. c. Notes from 2017 indicate interstitial lung disease by pulm appt, question amio toxicity, also increased liver attenuation. Rate control pursued and amiodarone discontinued.   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   s/p TAVR w/ a 29 mm Edwards Sapien 3 THV via the TF approach by Drs Burt Knack and Roxy Manns   Sleep apnea 12/2018   per patient's spouse wears BIPAP "every other night or so"   Thrombocytopenia ([place])    TIA (transient ischemic attack)    Past Surgical History:  Procedure Laterality Date   CATARACT EXTRACTION W/ INTRAOCULAR LENS  IMPLANT, BILATERAL     per patient about 4 years ago   CORONARY ARTERY BYPASS GRAFT  2004   ELECTROMAGNETIC NAVIGATION BROCHOSCOPY  10/14/2019   Procedure: NAVIGATION BRONCHOSCOPY;  Surgeon: Garner Nash, DO;  Location:  [facility];  Service: Pulmonary;;   INTERCOSTAL NERVE BLOCK Right 10/14/2019   Procedure: INTERCOSTAL NERVE BLOCK;  Surgeon: Grace Isaac, MD;  Location: [facility];  Service: Thoracic;  Laterality: Right;   INTRAOPERATIVE TRANSTHORACIC ECHOCARDIOGRAM N/A 05/26/2019   Procedure: INTRAOPERATIVE TRANSTHORACIC ECHOCARDIOGRAM;  Surgeon: Sherren Mocha, MD;  Location: [facility];  Service: Open Heart Surgery;  Laterality: N/A;   MITRAL VALVE REPAIR     RIGHT/LEFT HEART CATH AND CORONARY/GRAFT ANGIOGRAPHY N/A 04/28/2019   Procedure: RIGHT/LEFT HEART CATH AND CORONARY/GRAFT ANGIOGRAPHY;  Surgeon: Sherren Mocha, MD;  Location:  [facility];  Service: Cardiovascular;  Laterality: N/A;   SUBMUCOSAL TATTOO INJECTION  10/14/2019   Procedure: ENDOBRONCHIAL DYE INJECTION;  Surgeon: Garner Nash, DO;  Location: [facility];  Service: Pulmonary;;   TEE WITHOUT CARDIOVERSION N/A 05/20/2019   Procedure: TRANSESOPHAGEAL ECHOCARDIOGRAM (TEE);  Surgeon: Donato Heinz, MD;  Location: [facility];  Service: Cardiovascular;  Laterality: N/A;   TRANSCATHETER AORTIC VALVE REPLACEMENT, TRANSFEMORAL N/A 05/26/2019   Procedure: TRANSCATHETER AORTIC VALVE REPLACEMENT, TRANSFEMORAL;  Surgeon: Sherren Mocha, MD;  Location: [facility];  Service: Open Heart Surgery;  Laterality: N/A;   VIDEO BRONCHOSCOPY WITH ENDOBRONCHIAL NAVIGATION N/A 10/14/2019   Procedure: VIDEO BRONCHOSCOPY;  Surgeon: Garner Nash, DO;  Location: [facility];  Service: Pulmonary;  Laterality: N/A;     Current Meds  Medication Sig   apixaban (ELIQUIS) 2.5 MG TABS tablet Take 1 tablet (2.5 mg total) by mouth 2 (two) times daily.   hydrocortisone cream 1 % 1 application to affected area as needed for hemorrhoids   levothyroxine (SYNTHROID) 75 MCG tablet Take 75 mcg by mouth daily before breakfast.    metoprolol succinate (TOPROL-XL) 50 MG 24 hr tablet TAKE ONE TABLET BY MOUTH ONE TIME DAILY   Polyethyl Glycol-Propyl Glycol (SYSTANE OP) Place 1 drop into both eyes 3 (three) times daily as needed (dry/irritated eyes.).    pravastatin (PRAVACHOL) 80 MG tablet TAKE ONE TABLET BY MOUTH M/W/F/SAT   torsemide (DEMADEX) 20 MG tablet Take 20-40 mg by mouth See admin instructions. Take 2 tablets (40 mg) by mouth in the morning & take 1 tablet (20 mg) by mouth at night.   vitamin B-12 (CYANOCOBALAMIN) 1000 MCG tablet Take 1,000 mcg by mouth daily.    [DISCONTINUED] feeding supplement, ENSURE ENLIVE, (ENSURE ENLIVE) LIQD Take 237 mLs by mouth 2 (two) times daily between meals.   [DISCONTINUED] potassium chloride SA (KLOR-CON) 20 MEQ tablet TAKE ONE TABLET BY MOUTH  TWICE DAILY   [DISCONTINUED] traZODone (DESYREL) 100 MG tablet 1 tablet at bedtime as needed     Allergies:   Asa [aspirin], Flomax [tamsulosin], Lipitor [atorvastatin], Mirtazapine, Pirfenidone, and Tramadol   Social History   Tobacco Use   Smoking status: Former    Packs/day: 0.10    Years: 2.00    Pack years: 0.20    Types: Cigarettes    Quit date: 03/20/1955    Years since quitting: 65.8   Smokeless tobacco: Never  Vaping Use   Vaping Use: Never used  Substance Use Topics   Alcohol use: No    Alcohol/week: 0.0 standard drinks   Drug use: No     Family Hx: The patient's family history includes Heart disease in his father; Heart failure in his brother; Stroke in his brother and mother.  ROS:   Please see the history of present illness.    All other systems reviewed and are negative.  Prior CV studies:   The following studies were  reviewed today:  Renal Artery Duplex 09/06/2020: Summary:  Renal:     Right: No evidence of right renal artery stenosis. Abnormal right         Resistive Index.  Left:  No evidence of left renal artery stenosis. Abnormal left         Resisitve Index.   Echo 05/19/20 1. Left ventricular ejection fraction, by estimation, is 60 to 65%. The  left ventricle has normal function. The left ventricle has no regional  wall motion abnormalities. Left ventricular diastolic function could not  be evaluated. Elevated left  ventricular end-diastolic pressure.   2. Right ventricular systolic function is normal. The right ventricular  size is normal. There is normal pulmonary artery systolic pressure.   3. S/P MV repair with quandrangular resection of the posterior mitral  valve leaflet and placement of neo chord. Mild to moderate mitral valve  regurgitation is present. No evidence of mitral stenosis. The mean MV  gradient is 3.37mmHg.   4. The aortic valve has been repaired/replaced. Aortic valve  regurgitation is not visualized. No aortic stenosis is  present. There is a  29 mm Sapien prosthetic (TAVR) valve present in the aortic position.  Procedure Date: 05/26/2019. Aortic valve mean  gradient measures 6.0 mmHg. Aortic valve Vmax measures 1.48 m/s. DI 0.37.   5. The inferior vena cava is normal in size with greater than 50%  respiratory variability, suggesting right atrial pressure of 3 mmHg.   Echo 10/06/19 1. Left ventricular ejection fraction, by estimation, is 25 to 30%. The  left ventricle has severely decreased function. The left ventricle  demonstrates global hypokinesis. There is mild left ventricular  hypertrophy. Left ventricular diastolic parameters   are indeterminate.   2. Right ventricular systolic function is mildly reduced. The right  ventricular size is normal. There is normal pulmonary artery systolic  pressure. The estimated right ventricular systolic pressure is 81.0 mmHg.   3. Left atrial size was moderately dilated.   4. The mitral valve has been repaired/replaced. Mild mitral valve  regurgitation. No evidence of mitral stenosis. The mean mitral valve  gradient is 2.0 mmHg with average heart rate of 60 bpm. There is a 28 mm  prosthetic annuloplasty ring present in the  mitral position.   5. The aortic valve has been repaired/replaced. Aortic valve  regurgitation is trivial. There is a 29 mm Edwards Edwards Sapien  prosthetic (TAVR) valve present in the aortic position. Echo findings are  consistent with a trivial perivalvular leak of the  aortic prosthesis. Aortic valve peak and mean gradients of 4 and 7 mmHg,  respectively. AVA 2.75 cm2.   6. Aortic dilatation noted. There is borderline dilatation at the level  of the sinuses of Valsalva measuring 38 mm. The ascending aorta measures  normal in size.   7. The inferior vena cava is normal in size with greater than 50%  respiratory variability, suggesting right atrial pressure of 3 mmHg.   Comparison(s): Changes from prior study are noted. 06/25/19: LVEF <20%,  TAVR  with P/M gradients of 4 and 6 mmHg, trivial perivalvular leak.   Echo 06/25/19  1. Left ventricular ejection fraction, by estimation, is <20%. The left  ventricle has severely decreased function. The left ventricle demonstrates  global hypokinesis. The left ventricular internal cavity size was mildly  dilated. Left ventricular  diastolic parameters are indeterminate.   2. S/p mitral valve repair. Mild-moderate mitral regurgitation. Mean  gradient 4 mmHg, mild mitral stenosis.   3. Bioprosthetic  aortic valve s/p TAVR, 29 mm Edwards Sapien. Trivial  perivalvular regurgitation. Mean gradient 4 mmHg, no significant stenosis.   4. Left atrial size was moderately dilated.   5. Right ventricular systolic function is mildly reduced. The right  ventricular size is mildly enlarged. There is mildly elevated pulmonary  artery systolic pressure. The estimated right ventricular systolic  pressure is 20.2 mmHg.   6. The inferior vena cava is normal in size with <50% respiratory  variability, suggesting right atrial pressure of 8 mmHg.   R/L Heart Cath 04/28/2019: 1.  Nonobstructive coronary disease with mild diffuse stenosis of the LAD, left circumflex, and RCA 2.  Total occlusion of the first OM branch of the circumflex with continued patency of the saphenous vein graft to OM 3.  Severe aortic stenosis with mean gradient 33 mmHg and calculated valve area 0.8 cm 4.  Large V waves consistent with severe mitral regurgitation   Recommendation: Continue multidisciplinary evaluation for this patient with severe low-flow low gradient aortic stenosis, probable severe mitral regurgitation with previous mitral repair, and progressive heart failure symptoms  Echo 01/13/19  1. Left ventricular ejection fraction, by visual estimation, is 20 to 25%. The left ventricle has severely decreased function. Moderate to severely increased left ventricular size. There is no left ventricular hypertrophy.  2. Left  ventricular diastolic function could not be evaluated pattern of LV diastolic filling.  3. Global right ventricle has normal systolic function.The right ventricular size is mildly enlarged. No increase in right ventricular wall thickness.  4. Left atrial size was moderately dilated.  5. Right atrial size was moderately dilated.  6. Moderate calcification of the mitral valve leaflet(s).  7. Moderate thickening of the mitral valve leaflet(s).  8. Severely decreased mobility of the mitral valve leaflets.  9. The mitral valve has been repaired/replaced. Moderate to severe mitral valve regurgitation. 10. The tricuspid valve is normal in structure. Tricuspid valve regurgitation is mild. 11. The aortic valve is abnormal Aortic valve regurgitation is mild by color flow Doppler. 12. There is Severe calcifcation of the aortic valve. 13. There is Severely thickening of the aortic valve. 14. Suspect low flow-low gradient AS given visual limited movement of aortic valve and severely reduced LV EF. 15. The pulmonic valve was grossly normal. Pulmonic valve regurgitation is mild to moderate by color flow Doppler. 16. Moderately elevated pulmonary artery systolic pressure. 17. The inferior vena cava is normal in size with <50% respiratory variability, suggesting right atrial pressure of 8 mmHg. 18. While heart rate much improved on this study, side by side comparison shows dilation of the LV and stable severely reduced LV EF. The aortic valve is severely thickened/calcified and visually appears to minimally open. Suspect low flow low gradient  severe AS, as dimensionless index 0.18. Prior MV repair with immobile posterior mitral valve leaflet.  Labs/Other Tests and Data Reviewed:    EKG: Personally reviewed today. 12/27/2020: atrial fibrillation at 71 bpm 4/22- Atrial fibrillation, rate 84 7/21- Atrial fibrillation   Recent Labs: No results found for requested labs within last 8760 hours.   Recent Lipid  Panel Lab Results  Component Value Date/Time   CHOL 193 06/17/2017 07:44 AM   TRIG 114 06/17/2017 07:44 AM   HDL 35 (L) 06/17/2017 07:44 AM   CHOLHDL 5.5 (H) 06/17/2017 07:44 AM   CHOLHDL 2.6 03/16/2016 09:03 AM   LDLCALC 135 (H) 06/17/2017 07:44 AM    Wt Readings from Last 3 Encounters:  12/27/20 163 lb (73.9 kg)  06/20/20  176 lb (79.8 kg)  05/19/20 177 lb (80.3 kg)     Objective:    Vital Signs:  BP (!) 144/77   Pulse 71   Ht 5\' 8"  (1.727 m)   Wt 163 lb (73.9 kg)   SpO2 98%   BMI 24.78 kg/m    GEN: Well nourished, well developed in no acute distress HEENT: Normal, moist mucous membranes NECK: No JVD CARDIAC: irregularly irregular rhythm, normal S1 and S2, no rubs or gallops. No murmur appreciated today. VASCULAR: Radial and DP pulses 2+ bilaterally. No carotid bruits RESPIRATORY:  Clear to auscultation with prolonged expiratory phase. Well healed incision sites ABDOMEN: Soft, non-tender, non-distended MUSCULOSKELETAL:  Ambulates independently SKIN: Warm and dry, no edema NEUROLOGIC:  Alert and oriented x 3. No focal neuro deficits noted. PSYCHIATRIC:  Normal affect   ASSESSMENT & PLAN:    1. Permanent atrial fibrillation ([place])   2. Chronic combined systolic and diastolic congestive heart failure ([place])   3. S/P TAVR (transcatheter aortic valve replacement)   4. Long term (current) use of anticoagulants [Z79.01]   5. Coronary artery disease involving native heart without angina pectoris, unspecified vessel or lesion type   6. Chronic kidney disease (CKD), stage IV (severe) ([place])   7. H/O mitral valve repair     Chronic systolic and diastolic heart failure: systolic failure diagnosed in 12/2017. Pre-TAVR EF <20% -EF 25-30% 09/2019 -echo 05/19/20 reported as EF 60-65%. While it does not appear quite that vigorous to me, it is significantly improved from prior echo -reports doing well on torsemide 40 mg in the AM and 20 mg in the PM -continue metoprolol succinate -no  room for ACEi/ARB/ARNI/MRA given history of hypotension and CKD   Atrial Fibrillation, permanent:  -drastically improved rate control since TAVR. Had been extremely difficult to control rate prior. it -tolerating metoprolol succinate 50 mg daily, increased dose leads to worsening hypotension -not controlled on amiodarone in the past (and with liver/lung pathology, would avoid), no diltiazem given low EF, discussed digoxin in the past but now rate controlled -tolerating apixaban 2.5 mg BID (reduced for age, Cr). 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 -no chest pain -on pravastatin (see below), no aspirin given apixaban for afib   Hyperlipidemia: -Goal LDL <70 -lipids per [facility] 05/06/2020: Tchol 217, HDL 50, LDL 144, TG 130 -On pravastatin 80 mg. We have discussed intensifying in the past, but given age/comorbidities/lack of symptoms have not changed per patient preference.   Mitral Regurgitation: h/o MV repair. Mild-moderate on TAVR TEE   Aortic Stenosis, severe, s/p TAVR:  -has endocarditis prophylaxis ordered  Follow up: 6 months or sooner as needed  Orders Placed This Encounter  Procedures   EKG 12-Lead    No orders of the defined types were placed in this encounter.  Patient Instructions  Medication Instructions:  Your Physician recommend you continue on your current medication as directed.    *If you need a refill on your cardiac medications before your next appointment, please call your pharmacy*   Lab Work: None ordered today   Testing/Procedures: None ordered today   Follow-Up: At [facility], you and your health needs are our priority.  As part of our continuing mission to provide you with exceptional heart care, we have created designated  Provider Care Teams.  These  Care Teams include your primary Cardiologist (physician) and Advanced Practice Providers (APPs -  Physician Assistants and Nurse Practitioners) who all work together to provide you with the care you need, when you need it.  We recommend signing up for the patient portal called "MyChart".  Sign up information is provided on this After Visit Summary.  MyChart is used to connect with patients for Virtual Visits (Telemedicine).  Patients are able to view lab/test results, encounter notes, upcoming appointments, etc.  Non-urgent messages can be sent to your provider as well.   To learn more about what you can do with MyChart, go to [email or web address].    Your next appointment:   6 month(s)  The format for your next appointment:   In Person  Provider:   Buford Dresser, MD   Huston Foley as a scribe for Buford Dresser, MD.,have documented all relevant documentation on the behalf of Buford Dresser, MD,as directed by  Buford Dresser, MD while in the presence of Buford Dresser, MD.   I, Buford Dresser, MD, have reviewed all documentation for this visit. The documentation on 12/27/20 for the exam, diagnosis, procedures, and orders are all accurate and complete.   Signed, Buford Dresser, MD  12/27/2020  [facility]

## 2020-12-27 NOTE — Patient Instructions (Signed)

## 2021-01-09 DIAGNOSIS — N184 Chronic kidney disease, stage 4 (severe): Secondary | ICD-10-CM | POA: Diagnosis not present

## 2021-01-09 DIAGNOSIS — G4700 Insomnia, unspecified: Secondary | ICD-10-CM | POA: Diagnosis not present

## 2021-01-09 DIAGNOSIS — K219 Gastro-esophageal reflux disease without esophagitis: Secondary | ICD-10-CM | POA: Diagnosis not present

## 2021-01-09 DIAGNOSIS — I10 Essential (primary) hypertension: Secondary | ICD-10-CM | POA: Diagnosis not present

## 2021-01-09 DIAGNOSIS — I5022 Chronic systolic (congestive) heart failure: Secondary | ICD-10-CM | POA: Diagnosis not present

## 2021-01-09 DIAGNOSIS — E7800 Pure hypercholesterolemia, unspecified: Secondary | ICD-10-CM | POA: Diagnosis not present

## 2021-01-09 DIAGNOSIS — I5030 Unspecified diastolic (congestive) heart failure: Secondary | ICD-10-CM | POA: Diagnosis not present

## 2021-01-09 DIAGNOSIS — I480 Paroxysmal atrial fibrillation: Secondary | ICD-10-CM | POA: Diagnosis not present

## 2021-01-09 DIAGNOSIS — E039 Hypothyroidism, unspecified: Secondary | ICD-10-CM | POA: Diagnosis not present

## 2021-01-16 DIAGNOSIS — I10 Essential (primary) hypertension: Secondary | ICD-10-CM | POA: Diagnosis not present

## 2021-02-13 DIAGNOSIS — I10 Essential (primary) hypertension: Secondary | ICD-10-CM | POA: Diagnosis not present

## 2021-02-13 DIAGNOSIS — I480 Paroxysmal atrial fibrillation: Secondary | ICD-10-CM | POA: Diagnosis not present

## 2021-02-13 DIAGNOSIS — I5022 Chronic systolic (congestive) heart failure: Secondary | ICD-10-CM | POA: Diagnosis not present

## 2021-02-13 DIAGNOSIS — E039 Hypothyroidism, unspecified: Secondary | ICD-10-CM | POA: Diagnosis not present

## 2021-02-13 DIAGNOSIS — I5030 Unspecified diastolic (congestive) heart failure: Secondary | ICD-10-CM | POA: Diagnosis not present

## 2021-02-13 DIAGNOSIS — N184 Chronic kidney disease, stage 4 (severe): Secondary | ICD-10-CM | POA: Diagnosis not present

## 2021-02-13 DIAGNOSIS — E7800 Pure hypercholesterolemia, unspecified: Secondary | ICD-10-CM | POA: Diagnosis not present

## 2021-02-13 DIAGNOSIS — K219 Gastro-esophageal reflux disease without esophagitis: Secondary | ICD-10-CM | POA: Diagnosis not present

## 2021-02-13 DIAGNOSIS — G4700 Insomnia, unspecified: Secondary | ICD-10-CM | POA: Diagnosis not present

## 2021-02-14 DIAGNOSIS — I10 Essential (primary) hypertension: Secondary | ICD-10-CM | POA: Diagnosis not present

## 2021-02-17 ENCOUNTER — Telehealth: Payer: Self-pay | Admitting: Cardiology

## 2021-02-17 ENCOUNTER — Other Ambulatory Visit: Payer: Self-pay

## 2021-02-17 DIAGNOSIS — I4821 Permanent atrial fibrillation: Secondary | ICD-10-CM

## 2021-02-17 MED ORDER — APIXABAN 2.5 MG PO TABS: 2.5000 mg | ORAL_TABLET | Freq: Two times a day (BID) | ORAL | 3 refills | Status: DC

## 2021-02-17 NOTE — Telephone Encounter (Signed)
Patient's wife called stating [facility] doesn't have apixaban.  She would like it to be called into [facility].

## 2021-02-17 NOTE — Telephone Encounter (Signed)
There is no generic Eliquis.  Eliquis and apixaban are the same medication.

## 2021-02-17 NOTE — Telephone Encounter (Signed)
Called patient, and notified of PHARMD response, did try to send to pharmacy with update.  Patient aware.

## 2021-02-17 NOTE — Telephone Encounter (Signed)
Pt c/o medication issue:  1. Name of Medication: apixaban (ELIQUIS) 2.5 MG TABS tablet  2. How are you currently taking this medication (dosage and times per day)? AS DIRECTED  3. Are you having a reaction (difficulty breathing--STAT)? NO  4. What is your medication issue? PT'S WIFE STATES SHE NEEDS TO HAVE SOMEONE [facility] TO HAVE THE PT'S SCRIPT CHANGED FROM ELIQUIS TO APIXABAN.

## 2021-02-20 MED ORDER — APIXABAN 2.5 MG PO TABS: 2.5000 mg | ORAL_TABLET | Freq: Two times a day (BID) | ORAL | 3 refills | Status: DC

## 2021-02-20 NOTE — Telephone Encounter (Signed)
Medication refill sent to [facility] per pt. Request!

## 2021-02-20 NOTE — Addendum Note (Signed)
Addended by: Gerald Stabs on: 02/20/2021 08:24 AM   Modules accepted: Orders

## 2021-03-17 DIAGNOSIS — I10 Essential (primary) hypertension: Secondary | ICD-10-CM | POA: Diagnosis not present

## 2021-03-21 ENCOUNTER — Telehealth: Payer: Self-pay | Admitting: Internal Medicine

## 2021-03-21 NOTE — Telephone Encounter (Signed)
Patient was last seen May 2021

## 2021-03-22 NOTE — Telephone Encounter (Signed)
Last seen in 2021.  Last pulmonary function test done February 2021 he had both ILD and a nodule.  Last CT scan July 2021.  Last PET scan June 2021.  He did have surgery under Dr. 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  He also had a TAVR procedure in 2021.  I Plan - 30-minute visit with spirometry and DLCO on high-resolution CT chest supine and prone

## 2021-03-22 NOTE — Telephone Encounter (Signed)
Called patient but he did not answer. Left message for him to call back.  

## 2021-03-23 DIAGNOSIS — N2581 Secondary hyperparathyroidism of renal origin: Secondary | ICD-10-CM | POA: Diagnosis not present

## 2021-03-23 DIAGNOSIS — I509 Heart failure, unspecified: Secondary | ICD-10-CM | POA: Diagnosis not present

## 2021-03-23 DIAGNOSIS — E785 Hyperlipidemia, unspecified: Secondary | ICD-10-CM | POA: Diagnosis not present

## 2021-03-23 DIAGNOSIS — N184 Chronic kidney disease, stage 4 (severe): Secondary | ICD-10-CM | POA: Diagnosis not present

## 2021-03-23 DIAGNOSIS — E875 Hyperkalemia: Secondary | ICD-10-CM | POA: Diagnosis not present

## 2021-03-23 DIAGNOSIS — D631 Anemia in chronic kidney disease: Secondary | ICD-10-CM | POA: Diagnosis not present

## 2021-03-23 DIAGNOSIS — I129 Hypertensive chronic kidney disease with stage 1 through stage 4 chronic kidney disease, or unspecified chronic kidney disease: Secondary | ICD-10-CM | POA: Diagnosis not present

## 2021-03-31 DIAGNOSIS — H524 Presbyopia: Secondary | ICD-10-CM | POA: Diagnosis not present

## 2021-03-31 DIAGNOSIS — H40013 Open angle with borderline findings, low risk, bilateral: Secondary | ICD-10-CM | POA: Diagnosis not present

## 2021-03-31 DIAGNOSIS — H52203 Unspecified astigmatism, bilateral: Secondary | ICD-10-CM | POA: Diagnosis not present

## 2021-03-31 DIAGNOSIS — Z961 Presence of intraocular lens: Secondary | ICD-10-CM | POA: Diagnosis not present

## 2021-03-31 IMAGING — DX DG CHEST 2V
2 series · 2 of 2 positions shown · non-contrast
Comparison: October 17, 2019

CLINICAL DATA: Chest pain

EXAM:
CHEST - 2 VIEW

[dg chest 2 view (1 of 2)]
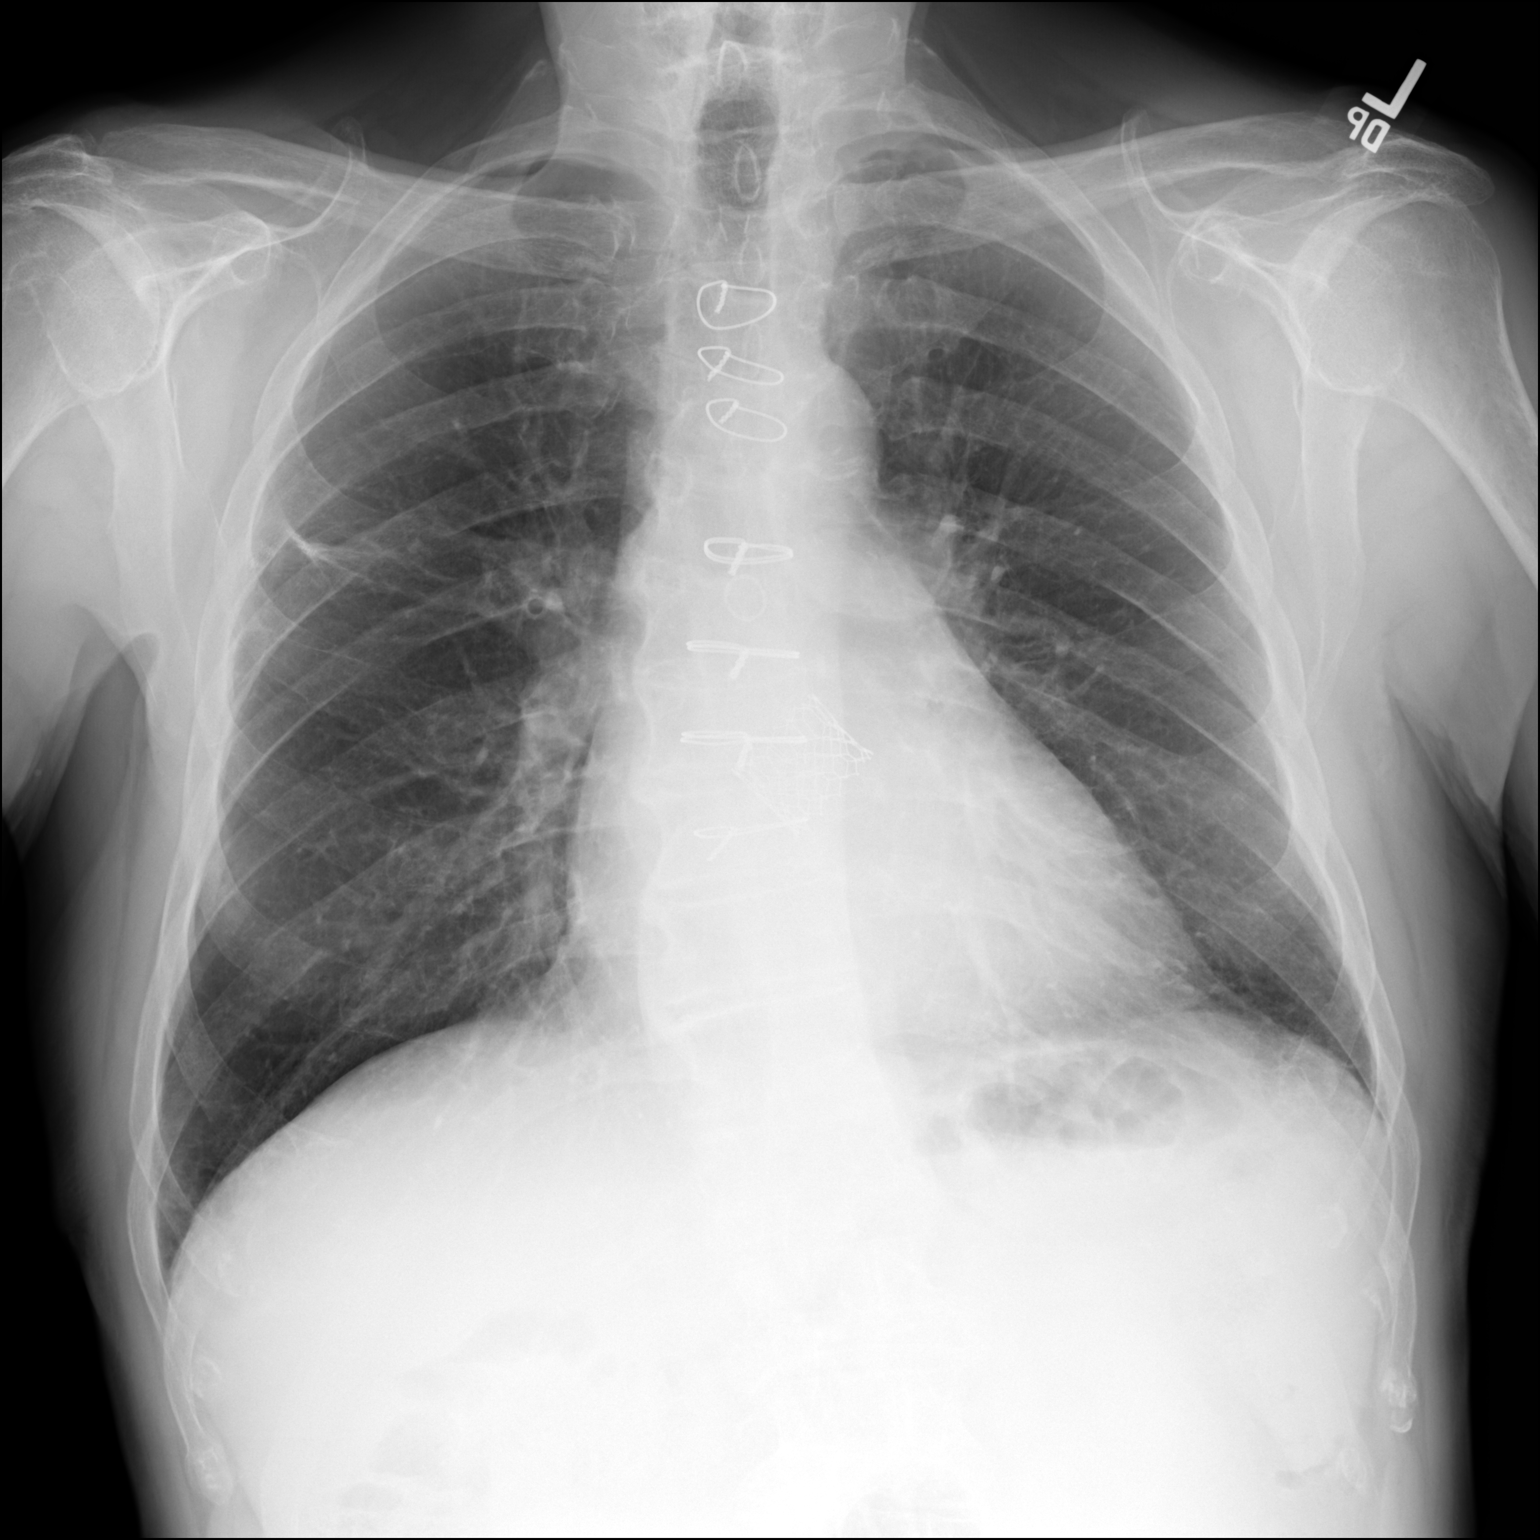

[dg chest 2 view (2 of 2)]
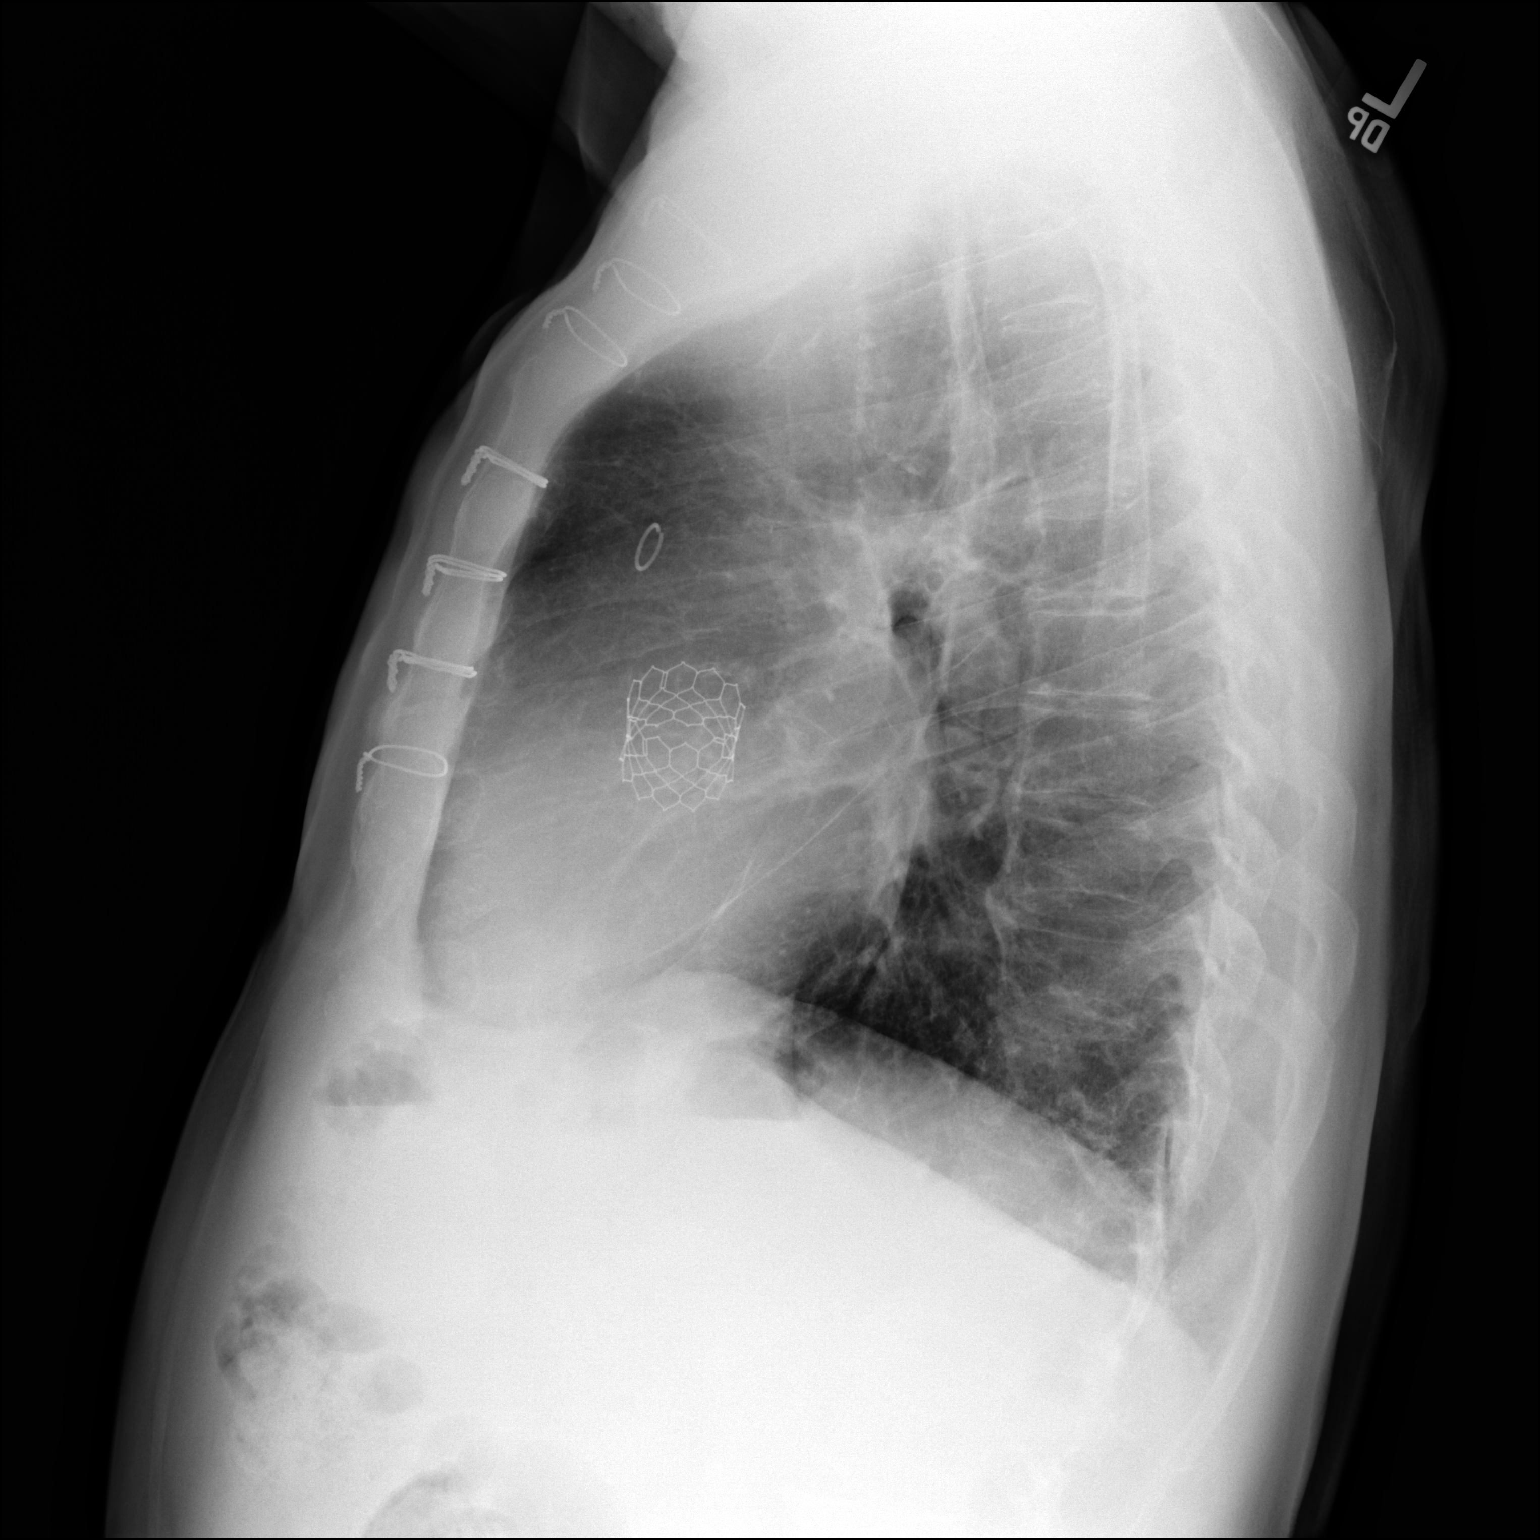

[2 of 2 positions shown; findings below may reference images not displayed]

FINDINGS: There is mild atelectatic change in the right upper lobe. The lungs
elsewhere are clear. Heart is upper normal in size with pulmonary
vascularity normal. Patient is status post coronary artery bypass
grafting as well as aortic valve replacement. No adenopathy. No
pneumothorax. No bone lesions.
IMPRESSION: Mild right upper lobe atelectasis. Lungs elsewhere clear. Heart
upper normal in size with postoperative changes. No adenopathy
evident.

## 2021-04-07 DIAGNOSIS — Z7901 Long term (current) use of anticoagulants: Secondary | ICD-10-CM | POA: Diagnosis not present

## 2021-04-07 DIAGNOSIS — D6869 Other thrombophilia: Secondary | ICD-10-CM | POA: Diagnosis not present

## 2021-04-07 DIAGNOSIS — I5040 Unspecified combined systolic (congestive) and diastolic (congestive) heart failure: Secondary | ICD-10-CM | POA: Diagnosis not present

## 2021-04-07 DIAGNOSIS — I480 Paroxysmal atrial fibrillation: Secondary | ICD-10-CM | POA: Diagnosis not present

## 2021-04-07 DIAGNOSIS — Z6824 Body mass index (BMI) 24.0-24.9, adult: Secondary | ICD-10-CM | POA: Diagnosis not present

## 2021-04-07 DIAGNOSIS — N184 Chronic kidney disease, stage 4 (severe): Secondary | ICD-10-CM | POA: Diagnosis not present

## 2021-04-17 DIAGNOSIS — I10 Essential (primary) hypertension: Secondary | ICD-10-CM | POA: Diagnosis not present

## 2021-05-16 DIAGNOSIS — I4821 Permanent atrial fibrillation: Secondary | ICD-10-CM | POA: Diagnosis not present

## 2021-05-16 DIAGNOSIS — J8489 Other specified interstitial pulmonary diseases: Secondary | ICD-10-CM | POA: Diagnosis not present

## 2021-05-16 DIAGNOSIS — I10 Essential (primary) hypertension: Secondary | ICD-10-CM | POA: Diagnosis not present

## 2021-05-16 DIAGNOSIS — I2510 Atherosclerotic heart disease of native coronary artery without angina pectoris: Secondary | ICD-10-CM | POA: Diagnosis not present

## 2021-05-16 DIAGNOSIS — Z0000 Encounter for general adult medical examination without abnormal findings: Secondary | ICD-10-CM | POA: Diagnosis not present

## 2021-05-16 DIAGNOSIS — E039 Hypothyroidism, unspecified: Secondary | ICD-10-CM | POA: Diagnosis not present

## 2021-05-16 DIAGNOSIS — I5022 Chronic systolic (congestive) heart failure: Secondary | ICD-10-CM | POA: Diagnosis not present

## 2021-05-16 DIAGNOSIS — D6869 Other thrombophilia: Secondary | ICD-10-CM | POA: Diagnosis not present

## 2021-05-16 DIAGNOSIS — R7303 Prediabetes: Secondary | ICD-10-CM | POA: Diagnosis not present

## 2021-05-16 DIAGNOSIS — N184 Chronic kidney disease, stage 4 (severe): Secondary | ICD-10-CM | POA: Diagnosis not present

## 2021-05-16 DIAGNOSIS — G4700 Insomnia, unspecified: Secondary | ICD-10-CM | POA: Diagnosis not present

## 2021-05-16 DIAGNOSIS — E7800 Pure hypercholesterolemia, unspecified: Secondary | ICD-10-CM | POA: Diagnosis not present

## 2021-06-12 DIAGNOSIS — N184 Chronic kidney disease, stage 4 (severe): Secondary | ICD-10-CM | POA: Diagnosis not present

## 2021-06-26 DIAGNOSIS — I509 Heart failure, unspecified: Secondary | ICD-10-CM | POA: Diagnosis not present

## 2021-06-26 DIAGNOSIS — E785 Hyperlipidemia, unspecified: Secondary | ICD-10-CM | POA: Diagnosis not present

## 2021-06-26 DIAGNOSIS — N184 Chronic kidney disease, stage 4 (severe): Secondary | ICD-10-CM | POA: Diagnosis not present

## 2021-06-26 DIAGNOSIS — D631 Anemia in chronic kidney disease: Secondary | ICD-10-CM | POA: Diagnosis not present

## 2021-06-26 DIAGNOSIS — N2581 Secondary hyperparathyroidism of renal origin: Secondary | ICD-10-CM | POA: Diagnosis not present

## 2021-06-26 DIAGNOSIS — I129 Hypertensive chronic kidney disease with stage 1 through stage 4 chronic kidney disease, or unspecified chronic kidney disease: Secondary | ICD-10-CM | POA: Diagnosis not present

## 2021-07-03 ENCOUNTER — Ambulatory Visit (HOSPITAL_BASED_OUTPATIENT_CLINIC_OR_DEPARTMENT_OTHER): Payer: PPO | Admitting: Cardiology

## 2021-07-05 DIAGNOSIS — K219 Gastro-esophageal reflux disease without esophagitis: Secondary | ICD-10-CM | POA: Diagnosis not present

## 2021-07-05 DIAGNOSIS — I480 Paroxysmal atrial fibrillation: Secondary | ICD-10-CM | POA: Diagnosis not present

## 2021-07-05 DIAGNOSIS — I10 Essential (primary) hypertension: Secondary | ICD-10-CM | POA: Diagnosis not present

## 2021-07-05 DIAGNOSIS — I5022 Chronic systolic (congestive) heart failure: Secondary | ICD-10-CM | POA: Diagnosis not present

## 2021-07-05 DIAGNOSIS — E7800 Pure hypercholesterolemia, unspecified: Secondary | ICD-10-CM | POA: Diagnosis not present

## 2021-07-05 DIAGNOSIS — N184 Chronic kidney disease, stage 4 (severe): Secondary | ICD-10-CM | POA: Diagnosis not present

## 2021-07-06 DIAGNOSIS — Z89021 Acquired absence of right finger(s): Secondary | ICD-10-CM | POA: Diagnosis not present

## 2021-07-06 DIAGNOSIS — N184 Chronic kidney disease, stage 4 (severe): Secondary | ICD-10-CM | POA: Diagnosis not present

## 2021-07-20 ENCOUNTER — Encounter (HOSPITAL_BASED_OUTPATIENT_CLINIC_OR_DEPARTMENT_OTHER): Payer: Self-pay | Admitting: Cardiology

## 2021-07-20 ENCOUNTER — Ambulatory Visit (HOSPITAL_BASED_OUTPATIENT_CLINIC_OR_DEPARTMENT_OTHER): Payer: PPO | Admitting: Cardiology

## 2021-07-20 VITALS — BP 126/82 | HR 65 | Ht 68.0 in | Wt 171.7 lb

## 2021-07-20 DIAGNOSIS — N184 Chronic kidney disease, stage 4 (severe): Secondary | ICD-10-CM

## 2021-07-20 DIAGNOSIS — Z9889 Other specified postprocedural states: Secondary | ICD-10-CM

## 2021-07-20 DIAGNOSIS — Z7901 Long term (current) use of anticoagulants: Secondary | ICD-10-CM

## 2021-07-20 DIAGNOSIS — I5042 Chronic combined systolic (congestive) and diastolic (congestive) heart failure: Secondary | ICD-10-CM

## 2021-07-20 DIAGNOSIS — Z952 Presence of prosthetic heart valve: Secondary | ICD-10-CM | POA: Diagnosis not present

## 2021-07-20 DIAGNOSIS — I4821 Permanent atrial fibrillation: Secondary | ICD-10-CM | POA: Diagnosis not present

## 2021-07-20 NOTE — Progress Notes (Signed)
?Cardiology Office Note:   ? ?Date:  07/20/2021  ? ?ID:  [name], DOB 01-11-1932, MRN [id number] ? ?PCP:  [name], MD  ?Cardiologist:  Buford Dresser, MD ? ?Referring MD: [name], MD  ? ?Chief Complaint:  Follow up ? ?History of Present Illness:   ? ?[name] is a 88 y.o. male with a hx of CAD s/p CABG, mitral valve repair, atrial fibrillation on anticoagulation, interstitial lung disease, chronic systolic and diastolic heart failure, low flow low gradient aortic stenosis s/p TAVR who is seen in follow up today. He follows at the [facility] in [place].  ? ?At his last appointment he was feeling about the same as he felt a year ago. His blood pressure log showed an at home range of 103/60 to 125/66, as well as a stable heart rate. Per his nephrologist his kidney function worsened. He was avoiding protein drinks and foods with potassium. ? ?Today: ?He is accompanied by his wife. He presents a blood pressure log that shows well controlled readings at home and his weight is stable. No recurrent palpitations. ? ?Yesterday, he complains of a pinpoint chest discomfort that only lasted a few seconds. This has not recurred. ? ?They note he is typically able to do what he wants physically, but he does note he needs to rest a little more frequently. During a few days he sanded and refinished a floor of their home, no significant exertional symptoms. He may become a little short winded if he over exerts himself with construction projects. ? ?He reports his atorvastatin was switched to pravastatin. ? ?He denies any palpitations, or peripheral edema. No lightheadedness, headaches, syncope, orthopnea, or PND. ? ? ?Past Medical History:  ?Diagnosis Date  ? Adenomatous colon polyp   ? CAD (coronary artery disease)   ? a. S/P CABG x 1 @ time of MV annuloplasty;  b. 02/2010 ETT: neg for ischemia.  ? Carotid artery stenosis   ? 1-39% by dopplers 10/2016  ? Chronic combined systolic (congestive) and  diastolic (congestive) heart failure (HCC)   ? CKD (chronic kidney disease), stage III ([place])   ? Current use of long term anticoagulation   ? a. Coumadin in setting of afib.  ? Diverticulosis   ? Dyslipidemia   ? Dysrhythmia   ? A-fib  ? Essential hypertension   ? H/O mitral valve repair 09/16/2002  ? Complex valvuloplasty including quadrangular resection of posterior leaflet with artificial Gore-tex neochords and 28 mm Seguin ring annuloplasty - Dr Servando Snare  ? Hemorrhoids   ? Hyperlipidemia   ? Hypertension   ? Permanent atrial fibrillation ([place])   ? a. CHA2DS2VASc = 6 -->chronic coumadin;  b. Prev on amio-->d/c 2/2 hypothyroidism. >> resumed 5/16. c. Notes from 2017 indicate interstitial lung disease by pulm appt, question amio toxicity, also increased liver attenuation. Rate control pursued and amiodarone discontinued.  ? S/P CABG x 1 09/16/2002  ? SVG to OM  ? 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 s/p TAVR w/ a 29 mm Edwards Sapien 3 THV via the TF approach by Drs Burt Knack and Roxy Manns  ? Sleep apnea 12/2018  ? per patient's spouse wears BIPAP "every other night or so"  ? Thrombocytopenia ([place])   ? TIA (transient ischemic attack)   ? ?Past Surgical History:  ?Procedure Laterality Date  ? CATARACT EXTRACTION W/ INTRAOCULAR LENS  IMPLANT, BILATERAL    ? per patient about 4 years ago  ? CORONARY ARTERY BYPASS GRAFT  2004  ? ELECTROMAGNETIC NAVIGATION BROCHOSCOPY  10/14/2019  ? Procedure: NAVIGATION BRONCHOSCOPY;  Surgeon: Garner Nash, DO;  Location: [facility];  Service: Pulmonary;;  ? INTERCOSTAL NERVE BLOCK Right 10/14/2019  ? Procedure: INTERCOSTAL NERVE BLOCK;  Surgeon: Grace Isaac, MD;  Location: [facility];  Service: Thoracic;  Laterality: Right;  ? INTRAOPERATIVE TRANSTHORACIC ECHOCARDIOGRAM N/A 05/26/2019  ? Procedure: INTRAOPERATIVE TRANSTHORACIC ECHOCARDIOGRAM;  Surgeon: Sherren Mocha, MD;  Location: [facility];  Service: Open Heart Surgery;  Laterality: N/A;  ? MITRAL VALVE REPAIR     ? RIGHT/LEFT HEART CATH AND CORONARY/GRAFT ANGIOGRAPHY N/A 04/28/2019  ? Procedure: RIGHT/LEFT HEART CATH AND CORONARY/GRAFT ANGIOGRAPHY;  Surgeon: Sherren Mocha, MD;  Location: [facility];  Service: Cardiovascular;  Laterality: N/A;  ? SUBMUCOSAL TATTOO INJECTION  10/14/2019  ? Procedure: ENDOBRONCHIAL DYE INJECTION;  Surgeon: Garner Nash, DO;  Location: [facility];  Service: Pulmonary;;  ? TEE WITHOUT CARDIOVERSION N/A 05/20/2019  ? Procedure: TRANSESOPHAGEAL ECHOCARDIOGRAM (TEE);  Surgeon: Donato Heinz, MD;  Location: [facility];  Service: Cardiovascular;  Laterality: N/A;  ? TRANSCATHETER AORTIC VALVE REPLACEMENT, TRANSFEMORAL N/A 05/26/2019  ? Procedure: TRANSCATHETER AORTIC VALVE REPLACEMENT, TRANSFEMORAL;  Surgeon: Sherren Mocha, MD;  Location: [facility];  Service: Open Heart Surgery;  Laterality: N/A;  ? VIDEO BRONCHOSCOPY WITH ENDOBRONCHIAL NAVIGATION N/A 10/14/2019  ? Procedure: VIDEO BRONCHOSCOPY;  Surgeon: Garner Nash, DO;  Location: [facility];  Service: Pulmonary;  Laterality: N/A;  ?  ? ?Current Meds  ?Medication Sig  ? apixaban (ELIQUIS) 2.5 MG TABS tablet Take 1 tablet (2.5 mg total) by mouth 2 (two) times daily.  ? atorvastatin (LIPITOR) 20 MG tablet Take by mouth.  ? hydrocortisone cream 1 % 1 application to affected area as needed for hemorrhoids  ? levothyroxine (SYNTHROID) 75 MCG tablet Take 75 mcg by mouth daily before breakfast.   ? metoprolol succinate (TOPROL-XL) 50 MG 24 hr tablet TAKE ONE TABLET BY MOUTH ONE TIME DAILY  ? Polyethyl Glycol-Propyl Glycol (SYSTANE OP) Place 1 drop into both eyes 3 (three) times daily as needed (dry/irritated eyes.).   ? torsemide (DEMADEX) 20 MG tablet Take 20-40 mg by mouth See admin instructions. Take 2 tablets (40 mg) by mouth in the morning & take 1 tablet (20 mg) by mouth at night.  ? vitamin B-12 (CYANOCOBALAMIN) 1000 MCG tablet Take 1,000 mcg by mouth daily.   ?  ? ?Allergies:   Asa [aspirin], Flomax [tamsulosin], Lipitor  [atorvastatin], Mirtazapine, Pirfenidone, and Tramadol  ? ?Social History  ? ?Tobacco Use  ? Smoking status: Former  ?  Packs/day: 0.10  ?  Years: 2.00  ?  Pack years: 0.20  ?  Types: Cigarettes  ?  Quit date: 03/20/1955  ?  Years since quitting: 66.3  ? Smokeless tobacco: Never  ?Vaping Use  ? Vaping Use: Never used  ?Substance Use Topics  ? Alcohol use: No  ?  Alcohol/week: 0.0 standard drinks  ? Drug use: No  ?  ? ?Family Hx: ?The patient's family history includes Heart disease in his father; Heart failure in his brother; Stroke in his brother and mother. ? ?ROS:   ?Please see the history of present illness.    ?(+) Shortness of breath ?(+) Chest discomfort (isolated episode) ?All other systems reviewed and are negative. ? ?Prior CV studies:   ?The following studies were reviewed today: ? ?Renal Artery Duplex 09/06/2020: ?Summary:  ?Renal:  ?   ?Right: No evidence of right renal artery stenosis. Abnormal right  ?  Resistive Index.  ?Left:  No evidence of left renal artery stenosis. Abnormal left  ?       Resisitve Index.  ? ?Echo 05/19/20 ?1. Left ventricular ejection fraction, by estimation, is 60 to 65%. The  ?left ventricle has normal function. The left ventricle has no regional  ?wall motion abnormalities. Left ventricular diastolic function could not  ?be evaluated. Elevated left  ?ventricular end-diastolic pressure.  ? 2. Right ventricular systolic function is normal. The right ventricular  ?size is normal. There is normal pulmonary artery systolic pressure.  ? 3. S/P MV repair with quandrangular resection of the posterior mitral  ?valve leaflet and placement of neo chord. Mild to moderate mitral valve  ?regurgitation is present. No evidence of mitral stenosis. The mean MV  ?gradient is 3.26mHg.  ? 4. The aortic valve has been repaired/replaced. Aortic valve  ?regurgitation is not visualized. No aortic stenosis is present. There is a  ?29 mm Sapien prosthetic (TAVR) valve present in the aortic position.   ?Procedure Date: 05/26/2019. Aortic valve mean  ?gradient measures 6.0 mmHg. Aortic valve Vmax measures 1.48 m/s. DI 0.37.  ? 5. The inferior vena cava is normal in size with greater than 50%  ?respiratory varia

## 2021-07-20 NOTE — Patient Instructions (Addendum)
Medication Instructions:  ?Your physician recommends that you continue on your current medications as directed. Please refer to the Current Medication list given to you today.  ? ?*If you need a refill on your cardiac medications before your next appointment, please call your pharmacy* ? ?Lab Work: ?NONE ? ?Testing/Procedures: ?NONE ? ?Follow-Up: ?At [facility], you and your health needs are our priority.  As part of our continuing mission to provide you with exceptional heart care, we have created designated Provider Care Teams.  These Care Teams include your primary Cardiologist (physician) and Advanced Practice Providers (APPs -  Physician Assistants and Nurse Practitioners) who all work together to provide you with the care you need, when you need it. ? ?We recommend signing up for the patient portal called "MyChart".  Sign up information is provided on this After Visit Summary.  MyChart is used to connect with patients for Virtual Visits (Telemedicine).  Patients are able to view lab/test results, encounter notes, upcoming appointments, etc.  Non-urgent messages can be sent to your provider as well.   ?To learn more about what you can do with MyChart, go to [email or web address].   ? ?Your next appointment:   ?6 month(s) ? ?The format for your next appointment:   ?In Person ? ?Provider:   ?DR Harrell Gave  ? ?

## 2021-07-24 DIAGNOSIS — M10072 Idiopathic gout, left ankle and foot: Secondary | ICD-10-CM | POA: Diagnosis not present

## 2021-08-07 DIAGNOSIS — K219 Gastro-esophageal reflux disease without esophagitis: Secondary | ICD-10-CM | POA: Diagnosis not present

## 2021-08-07 DIAGNOSIS — E039 Hypothyroidism, unspecified: Secondary | ICD-10-CM | POA: Diagnosis not present

## 2021-08-07 DIAGNOSIS — E7800 Pure hypercholesterolemia, unspecified: Secondary | ICD-10-CM | POA: Diagnosis not present

## 2021-08-07 DIAGNOSIS — N183 Chronic kidney disease, stage 3 unspecified: Secondary | ICD-10-CM | POA: Diagnosis not present

## 2021-08-07 DIAGNOSIS — I10 Essential (primary) hypertension: Secondary | ICD-10-CM | POA: Diagnosis not present

## 2021-08-07 DIAGNOSIS — I5030 Unspecified diastolic (congestive) heart failure: Secondary | ICD-10-CM | POA: Diagnosis not present

## 2021-08-07 DIAGNOSIS — I5022 Chronic systolic (congestive) heart failure: Secondary | ICD-10-CM | POA: Diagnosis not present

## 2021-09-13 DIAGNOSIS — I5022 Chronic systolic (congestive) heart failure: Secondary | ICD-10-CM | POA: Diagnosis not present

## 2021-09-13 DIAGNOSIS — I10 Essential (primary) hypertension: Secondary | ICD-10-CM | POA: Diagnosis not present

## 2021-09-13 DIAGNOSIS — E039 Hypothyroidism, unspecified: Secondary | ICD-10-CM | POA: Diagnosis not present

## 2021-09-13 DIAGNOSIS — E7800 Pure hypercholesterolemia, unspecified: Secondary | ICD-10-CM | POA: Diagnosis not present

## 2021-10-16 DIAGNOSIS — N184 Chronic kidney disease, stage 4 (severe): Secondary | ICD-10-CM | POA: Diagnosis not present

## 2021-10-16 DIAGNOSIS — I129 Hypertensive chronic kidney disease with stage 1 through stage 4 chronic kidney disease, or unspecified chronic kidney disease: Secondary | ICD-10-CM | POA: Diagnosis not present

## 2021-10-18 DIAGNOSIS — N184 Chronic kidney disease, stage 4 (severe): Secondary | ICD-10-CM | POA: Diagnosis not present

## 2021-10-18 DIAGNOSIS — E875 Hyperkalemia: Secondary | ICD-10-CM | POA: Diagnosis not present

## 2021-10-18 DIAGNOSIS — I129 Hypertensive chronic kidney disease with stage 1 through stage 4 chronic kidney disease, or unspecified chronic kidney disease: Secondary | ICD-10-CM | POA: Diagnosis not present

## 2021-10-18 DIAGNOSIS — N2581 Secondary hyperparathyroidism of renal origin: Secondary | ICD-10-CM | POA: Diagnosis not present

## 2021-10-18 DIAGNOSIS — E785 Hyperlipidemia, unspecified: Secondary | ICD-10-CM | POA: Diagnosis not present

## 2021-10-18 DIAGNOSIS — I509 Heart failure, unspecified: Secondary | ICD-10-CM | POA: Diagnosis not present

## 2021-10-18 DIAGNOSIS — D631 Anemia in chronic kidney disease: Secondary | ICD-10-CM | POA: Diagnosis not present

## 2021-11-06 DIAGNOSIS — I5022 Chronic systolic (congestive) heart failure: Secondary | ICD-10-CM | POA: Diagnosis not present

## 2021-11-06 DIAGNOSIS — I10 Essential (primary) hypertension: Secondary | ICD-10-CM | POA: Diagnosis not present

## 2021-11-06 DIAGNOSIS — E039 Hypothyroidism, unspecified: Secondary | ICD-10-CM | POA: Diagnosis not present

## 2021-11-06 DIAGNOSIS — K219 Gastro-esophageal reflux disease without esophagitis: Secondary | ICD-10-CM | POA: Diagnosis not present

## 2021-11-06 DIAGNOSIS — E7800 Pure hypercholesterolemia, unspecified: Secondary | ICD-10-CM | POA: Diagnosis not present

## 2021-11-06 DIAGNOSIS — I5030 Unspecified diastolic (congestive) heart failure: Secondary | ICD-10-CM | POA: Diagnosis not present

## 2021-12-26 DIAGNOSIS — K219 Gastro-esophageal reflux disease without esophagitis: Secondary | ICD-10-CM | POA: Diagnosis not present

## 2021-12-26 DIAGNOSIS — N184 Chronic kidney disease, stage 4 (severe): Secondary | ICD-10-CM | POA: Diagnosis not present

## 2021-12-26 DIAGNOSIS — E7800 Pure hypercholesterolemia, unspecified: Secondary | ICD-10-CM | POA: Diagnosis not present

## 2021-12-26 DIAGNOSIS — I10 Essential (primary) hypertension: Secondary | ICD-10-CM | POA: Diagnosis not present

## 2021-12-26 DIAGNOSIS — E039 Hypothyroidism, unspecified: Secondary | ICD-10-CM | POA: Diagnosis not present

## 2022-01-12 DIAGNOSIS — N184 Chronic kidney disease, stage 4 (severe): Secondary | ICD-10-CM | POA: Diagnosis not present

## 2022-01-12 DIAGNOSIS — I6529 Occlusion and stenosis of unspecified carotid artery: Secondary | ICD-10-CM | POA: Diagnosis not present

## 2022-01-12 DIAGNOSIS — I130 Hypertensive heart and chronic kidney disease with heart failure and stage 1 through stage 4 chronic kidney disease, or unspecified chronic kidney disease: Secondary | ICD-10-CM | POA: Diagnosis not present

## 2022-01-12 DIAGNOSIS — E785 Hyperlipidemia, unspecified: Secondary | ICD-10-CM | POA: Diagnosis not present

## 2022-01-12 DIAGNOSIS — D6869 Other thrombophilia: Secondary | ICD-10-CM | POA: Diagnosis not present

## 2022-01-12 DIAGNOSIS — I10 Essential (primary) hypertension: Secondary | ICD-10-CM | POA: Diagnosis not present

## 2022-01-12 DIAGNOSIS — I509 Heart failure, unspecified: Secondary | ICD-10-CM | POA: Diagnosis not present

## 2022-01-12 DIAGNOSIS — I349 Nonrheumatic mitral valve disorder, unspecified: Secondary | ICD-10-CM | POA: Diagnosis not present

## 2022-01-12 DIAGNOSIS — E663 Overweight: Secondary | ICD-10-CM | POA: Diagnosis not present

## 2022-01-12 DIAGNOSIS — H9193 Unspecified hearing loss, bilateral: Secondary | ICD-10-CM | POA: Diagnosis not present

## 2022-01-12 DIAGNOSIS — I2510 Atherosclerotic heart disease of native coronary artery without angina pectoris: Secondary | ICD-10-CM | POA: Diagnosis not present

## 2022-01-12 DIAGNOSIS — I4821 Permanent atrial fibrillation: Secondary | ICD-10-CM | POA: Diagnosis not present

## 2022-01-17 ENCOUNTER — Ambulatory Visit (HOSPITAL_BASED_OUTPATIENT_CLINIC_OR_DEPARTMENT_OTHER): Payer: PPO | Admitting: Cardiology

## 2022-01-17 ENCOUNTER — Encounter (HOSPITAL_BASED_OUTPATIENT_CLINIC_OR_DEPARTMENT_OTHER): Payer: Self-pay | Admitting: Cardiology

## 2022-01-17 VITALS — BP 132/62 | HR 69 | Ht 68.0 in | Wt 177.3 lb

## 2022-01-17 DIAGNOSIS — Z952 Presence of prosthetic heart valve: Secondary | ICD-10-CM

## 2022-01-17 DIAGNOSIS — I2510 Atherosclerotic heart disease of native coronary artery without angina pectoris: Secondary | ICD-10-CM

## 2022-01-17 DIAGNOSIS — Z7901 Long term (current) use of anticoagulants: Secondary | ICD-10-CM | POA: Diagnosis not present

## 2022-01-17 DIAGNOSIS — N184 Chronic kidney disease, stage 4 (severe): Secondary | ICD-10-CM

## 2022-01-17 DIAGNOSIS — I4821 Permanent atrial fibrillation: Secondary | ICD-10-CM | POA: Diagnosis not present

## 2022-01-17 NOTE — Patient Instructions (Signed)
Medication Instructions:  Your Physician recommend you continue on your current medication as directed.    *If you need a refill on your cardiac medications before your next appointment, please call your pharmacy*   Lab Work: None ordered today   Testing/Procedures: None ordered today   Follow-Up: At [facility], you and your health needs are our priority.  As part of our continuing mission to provide you with exceptional heart care, we have created designated Provider Care Teams.  These Care Teams include your primary Cardiologist (physician) and Advanced Practice Providers (APPs -  Physician Assistants and Nurse Practitioners) who all work together to provide you with the care you need, when you need it.  We recommend signing up for the patient portal called "MyChart".  Sign up information is provided on this After Visit Summary.  MyChart is used to connect with patients for Virtual Visits (Telemedicine).  Patients are able to view lab/test results, encounter notes, upcoming appointments, etc.  Non-urgent messages can be sent to your provider as well.   To learn more about what you can do with MyChart, go to [email or web address].    Your next appointment:   6 month(s)  The format for your next appointment:   In Person  Provider:   Buford Dresser, MD

## 2022-01-17 NOTE — Progress Notes (Signed)
Cardiology Office Note:    Date:  01/17/2022   ID:  [name], DOB 26-Jun-1931, MRN [id number]  PCP:  [name], MD  Cardiologist:  Buford Dresser, MD  Referring MD: [name], MD   Chief Complaint:  Follow up  History of Present Illness:    [name] is a 88 y.o. male with a hx of CAD s/p CABG, mitral valve repair, atrial fibrillation on anticoagulation, interstitial lung disease, chronic systolic and diastolic heart failure, low flow low gradient aortic stenosis s/p TAVR who is seen in follow up today. He follows at the [facility] in [place].   At his last visit he reported well controlled blood pressures at home and his weight was stable. He had an isolated episode of pinpoint chest discomfort that lasted a few seconds with no recurrence. He noticed that he needed to rest a little more frequently than usual. His atorvastatin had been switched to pravastatin.  Today: He is accompanied by his wife. He notes that his breathing has been normal. Recently he was able to climb a steep hill with no significant symptoms. His weights are also generally stable. However, he does plan to work on some weight loss.  In clinic today his blood pressure is 132/62, which he notes is a little high compared to with his at home readings.  During the day he endorses urinary frequency every 2-3 hours. At night he usually only needs to wake up once to use the restroom. He continues to struggle with poor sleep quality sometimes.  No bleeding issues on Eliquis.  He denies any palpitations, chest pain, or peripheral edema. No lightheadedness, headaches, syncope, orthopnea, or PND.   Past Medical History:  Diagnosis Date   Adenomatous colon polyp    CAD (coronary artery disease)    a. S/P CABG x 1 @ time of MV annuloplasty;  b. 02/2010 ETT: neg for ischemia.   Carotid artery stenosis    1-39% by dopplers 10/2016   Chronic combined systolic (congestive) and diastolic (congestive)  heart failure (HCC)    CKD (chronic kidney disease), stage III ([place])    Current use of long term anticoagulation    a. Coumadin in setting of afib.   Diverticulosis    Dyslipidemia    Dysrhythmia    A-fib   Essential hypertension    H/O mitral valve repair 09/16/2002   Complex valvuloplasty including quadrangular resection of posterior leaflet with artificial Gore-tex neochords and 28 mm Seguin ring annuloplasty - Dr Servando Snare   Hemorrhoids    Hyperlipidemia    Hypertension    Permanent atrial fibrillation ([place])    a. CHA2DS2VASc = 6 -->chronic coumadin;  b. Prev on amio-->d/c 2/2 hypothyroidism. >> resumed 5/16. c. Notes from 2017 indicate interstitial lung disease by pulm appt, question amio toxicity, also increased liver attenuation. Rate control pursued and amiodarone discontinued.   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   s/p TAVR w/ a 29 mm Edwards Sapien 3 THV via the TF approach by Drs Burt Knack and Roxy Manns   Sleep apnea 12/2018   per patient's spouse wears BIPAP "every other night or so"   Thrombocytopenia ([place])    TIA (transient ischemic attack)    Past Surgical History:  Procedure Laterality Date   CATARACT EXTRACTION W/ INTRAOCULAR LENS  IMPLANT, BILATERAL     per patient about 4 years ago   [place] GRAFT  2004  ELECTROMAGNETIC NAVIGATION BROCHOSCOPY  10/14/2019   Procedure: NAVIGATION BRONCHOSCOPY;  Surgeon: Garner Nash, DO;  Location: [facility];  Service: Pulmonary;;   INTERCOSTAL NERVE BLOCK Right 10/14/2019   Procedure: INTERCOSTAL NERVE BLOCK;  Surgeon: Grace Isaac, MD;  Location: [facility];  Service: Thoracic;  Laterality: Right;   INTRAOPERATIVE TRANSTHORACIC ECHOCARDIOGRAM N/A 05/26/2019   Procedure: INTRAOPERATIVE TRANSTHORACIC ECHOCARDIOGRAM;  Surgeon: Sherren Mocha, MD;  Location: [facility];  Service: Open Heart Surgery;  Laterality: N/A;   MITRAL VALVE REPAIR     RIGHT/LEFT HEART CATH  AND CORONARY/GRAFT ANGIOGRAPHY N/A 04/28/2019   Procedure: RIGHT/LEFT HEART CATH AND CORONARY/GRAFT ANGIOGRAPHY;  Surgeon: Sherren Mocha, MD;  Location: [facility];  Service: Cardiovascular;  Laterality: N/A;   SUBMUCOSAL TATTOO INJECTION  10/14/2019   Procedure: ENDOBRONCHIAL DYE INJECTION;  Surgeon: Garner Nash, DO;  Location: [facility];  Service: Pulmonary;;   TEE WITHOUT CARDIOVERSION N/A 05/20/2019   Procedure: TRANSESOPHAGEAL ECHOCARDIOGRAM (TEE);  Surgeon: Donato Heinz, MD;  Location[facility];  Service: Cardiovascular;  Laterality: N/A;   TRANSCATHETER AORTIC VALVE REPLACEMENT, TRANSFEMORAL N/A 05/26/2019   Procedure: TRANSCATHETER AORTIC VALVE REPLACEMENT, TRANSFEMORAL;  Surgeon: Sherren Mocha, MD;  Location: [facility];  Service: Open Heart Surgery;  Laterality: N/A;   VIDEO BRONCHOSCOPY WITH ENDOBRONCHIAL NAVIGATION N/A 10/14/2019   Procedure: VIDEO BRONCHOSCOPY;  Surgeon: Garner Nash, DO;  Location: [facility];  Service: Pulmonary;  Laterality: N/A;     Current Meds  Medication Sig   apixaban (ELIQUIS) 2.5 MG TABS tablet Take 1 tablet (2.5 mg total) by mouth 2 (two) times daily.   atorvastatin (LIPITOR) 20 MG tablet Take by mouth. Every Monday, Wednesday and Friday   hydrocortisone cream 1 % 1 application to affected area as needed for hemorrhoids   levothyroxine (SYNTHROID) 75 MCG tablet Take 75 mcg by mouth daily before breakfast.    metoprolol succinate (TOPROL-XL) 50 MG 24 hr tablet TAKE ONE TABLET BY MOUTH ONE TIME DAILY   Polyethyl Glycol-Propyl Glycol (SYSTANE OP) Place 1 drop into both eyes 3 (three) times daily as needed (dry/irritated eyes.).    torsemide (DEMADEX) 20 MG tablet Take 20-40 mg by mouth See admin instructions. Take 2 tablets (40 mg) by mouth in the morning & take 1 tablet (20 mg) by mouth at night.   vitamin B-12 (CYANOCOBALAMIN) 1000 MCG tablet Take 1,000 mcg by mouth daily.      Allergies:   Asa [aspirin], Flomax [tamsulosin],  Lipitor [atorvastatin], Mirtazapine, Pirfenidone, and Tramadol   Social History   Tobacco Use   Smoking status: Former    Packs/day: 0.10    Years: 2.00    Total pack years: 0.20    Types: Cigarettes    Quit date: 03/20/1955    Years since quitting: 66.8   Smokeless tobacco: Never  Vaping Use   Vaping Use: Never used  Substance Use Topics   Alcohol use: No    Alcohol/week: 0.0 standard drinks of alcohol   Drug use: No     Family Hx: The patient's family history includes Heart disease in his father; Heart failure in his brother; Stroke in his brother and mother.  ROS:   Please see the history of present illness.    (+) Urinary frequency All other systems reviewed and are negative.  Prior CV studies:   The following studies were reviewed today:  Renal Artery Duplex 09/06/2020: Summary:  Renal:     Right: No evidence of right renal artery stenosis. Abnormal right  Resistive Index.  Left:  No evidence of left renal artery stenosis. Abnormal left         Resisitve Index.   Echo 05/19/20 1. Left ventricular ejection fraction, by estimation, is 60 to 65%. The  left ventricle has normal function. The left ventricle has no regional  wall motion abnormalities. Left ventricular diastolic function could not  be evaluated. Elevated left  ventricular end-diastolic pressure.   2. Right ventricular systolic function is normal. The right ventricular  size is normal. There is normal pulmonary artery systolic pressure.   3. S/P MV repair with quandrangular resection of the posterior mitral  valve leaflet and placement of neo chord. Mild to moderate mitral valve  regurgitation is present. No evidence of mitral stenosis. The mean MV  gradient is 3.7mHg.   4. The aortic valve has been repaired/replaced. Aortic valve  regurgitation is not visualized. No aortic stenosis is present. There is a  29 mm Sapien prosthetic (TAVR) valve present in the aortic position.  Procedure Date:  05/26/2019. Aortic valve mean  gradient measures 6.0 mmHg. Aortic valve Vmax measures 1.48 m/s. DI 0.37.   5. The inferior vena cava is normal in size with greater than 50%  respiratory variability, suggesting right atrial pressure of 3 mmHg.   Echo 10/06/19 1. Left ventricular ejection fraction, by estimation, is 25 to 30%. The  left ventricle has severely decreased function. The left ventricle  demonstrates global hypokinesis. There is mild left ventricular  hypertrophy. Left ventricular diastolic parameters   are indeterminate.   2. Right ventricular systolic function is mildly reduced. The right  ventricular size is normal. There is normal pulmonary artery systolic  pressure. The estimated right ventricular systolic pressure is 281.8mmHg.   3. Left atrial size was moderately dilated.   4. The mitral valve has been repaired/replaced. Mild mitral valve  regurgitation. No evidence of mitral stenosis. The mean mitral valve  gradient is 2.0 mmHg with average heart rate of 60 bpm. There is a 28 mm  prosthetic annuloplasty ring present in the  mitral position.   5. The aortic valve has been repaired/replaced. Aortic valve  regurgitation is trivial. There is a 29 mm Edwards Edwards Sapien  prosthetic (TAVR) valve present in the aortic position. Echo findings are  consistent with a trivial perivalvular leak of the  aortic prosthesis. Aortic valve peak and mean gradients of 4 and 7 mmHg,  respectively. AVA 2.75 cm2.   6. Aortic dilatation noted. There is borderline dilatation at the level  of the sinuses of Valsalva measuring 38 mm. The ascending aorta measures  normal in size.   7. The inferior vena cava is normal in size with greater than 50%  respiratory variability, suggesting right atrial pressure of 3 mmHg.   Comparison(s): Changes from prior study are noted. 06/25/19: LVEF <20%, TAVR  with P/M gradients of 4 and 6 mmHg, trivial perivalvular leak.   Echo 06/25/19  1. Left ventricular  ejection fraction, by estimation, is <20%. The left  ventricle has severely decreased function. The left ventricle demonstrates  global hypokinesis. The left ventricular internal cavity size was mildly  dilated. Left ventricular  diastolic parameters are indeterminate.   2. S/p mitral valve repair. Mild-moderate mitral regurgitation. Mean  gradient 4 mmHg, mild mitral stenosis.   3. Bioprosthetic aortic valve s/p TAVR, 29 mm Edwards Sapien. Trivial  perivalvular regurgitation. Mean gradient 4 mmHg, no significant stenosis.   4. Left atrial size was moderately dilated.   5. Right  ventricular systolic function is mildly reduced. The right  ventricular size is mildly enlarged. There is mildly elevated pulmonary  artery systolic pressure. The estimated right ventricular systolic  pressure is 10.6 mmHg.   6. The inferior vena cava is normal in size with <50% respiratory  variability, suggesting right atrial pressure of 8 mmHg.   R/L Heart Cath 04/28/2019: 1.  Nonobstructive coronary disease with mild diffuse stenosis of the LAD, left circumflex, and RCA 2.  Total occlusion of the first OM branch of the circumflex with continued patency of the saphenous vein graft to OM 3.  Severe aortic stenosis with mean gradient 33 mmHg and calculated valve area 0.8 cm 4.  Large V waves consistent with severe mitral regurgitation   Recommendation: Continue multidisciplinary evaluation for this patient with severe low-flow low gradient aortic stenosis, probable severe mitral regurgitation with previous mitral repair, and progressive heart failure symptoms  Echo 01/13/19  1. Left ventricular ejection fraction, by visual estimation, is 20 to 25%. The left ventricle has severely decreased function. Moderate to severely increased left ventricular size. There is no left ventricular hypertrophy.  2. Left ventricular diastolic function could not be evaluated pattern of LV diastolic filling.  3. Global right ventricle  has normal systolic function.The right ventricular size is mildly enlarged. No increase in right ventricular wall thickness.  4. Left atrial size was moderately dilated.  5. Right atrial size was moderately dilated.  6. Moderate calcification of the mitral valve leaflet(s).  7. Moderate thickening of the mitral valve leaflet(s).  8. Severely decreased mobility of the mitral valve leaflets.  9. The mitral valve has been repaired/replaced. Moderate to severe mitral valve regurgitation. 10. The tricuspid valve is normal in structure. Tricuspid valve regurgitation is mild. 11. The aortic valve is abnormal Aortic valve regurgitation is mild by color flow Doppler. 12. There is Severe calcifcation of the aortic valve. 13. There is Severely thickening of the aortic valve. 14. Suspect low flow-low gradient AS given visual limited movement of aortic valve and severely reduced LV EF. 15. The pulmonic valve was grossly normal. Pulmonic valve regurgitation is mild to moderate by color flow Doppler. 16. Moderately elevated pulmonary artery systolic pressure. 17. The inferior vena cava is normal in size with <50% respiratory variability, suggesting right atrial pressure of 8 mmHg. 18. While heart rate much improved on this study, side by side comparison shows dilation of the LV and stable severely reduced LV EF. The aortic valve is severely thickened/calcified and visually appears to minimally open. Suspect low flow low gradient  severe AS, as dimensionless index 0.18. Prior MV repair with immobile posterior mitral valve leaflet.  Labs/Other Tests and Data Reviewed:    EKG: Personally reviewed today. 01/17/2022: atrial fibrillation at 69 bpm 07/20/2021: not ordered. 12/27/2020: atrial fibrillation at 71 bpm 4/22- Atrial fibrillation, rate 84 7/21- Atrial fibrillation   Recent Labs: No results found for requested labs within last 365 days.   Recent Lipid Panel Lab Results  Component Value Date/Time    CHOL 193 06/17/2017 07:44 AM   TRIG 114 06/17/2017 07:44 AM   HDL 35 (L) 06/17/2017 07:44 AM   CHOLHDL 5.5 (H) 06/17/2017 07:44 AM   CHOLHDL 2.6 03/16/2016 09:03 AM   LDLCALC 135 (H) 06/17/2017 07:44 AM    Wt Readings from Last 3 Encounters:  01/17/22 177 lb 4.8 oz (80.4 kg)  07/20/21 171 lb 11.2 oz (77.9 kg)  12/27/20 163 lb (73.9 kg)     Objective:    Vital  Signs:  BP 132/62 (BP Location: Left Arm, Patient Position: Sitting, Cuff Size: Normal)   Pulse 69   Ht '5\' 8"'$  (1.727 m)   Wt 177 lb 4.8 oz (80.4 kg)   BMI 26.96 kg/m    GEN: Well nourished, well developed in no acute distress HEENT: Normal, moist mucous membranes NECK: No JVD CARDIAC: irregularly irregular rhythm, normal S1 and S2, no rubs or gallops. 1/6 systolic murmur appreciated today. VASCULAR: Radial and DP pulses 2+ bilaterally. No carotid bruits RESPIRATORY:  Clear to auscultation bilaterally ABDOMEN: Soft, non-tender, non-distended MUSCULOSKELETAL:  Ambulates independently SKIN: Warm and dry, no edema NEUROLOGIC:  Alert and oriented x 3. No focal neuro deficits noted. PSYCHIATRIC:  Normal affect   ASSESSMENT & PLAN:    1. Permanent atrial fibrillation ([place])   2. Long term (current) use of anticoagulants [Z79.01]   3. S/P TAVR (transcatheter aortic valve replacement)   4. CKD (chronic kidney disease) stage 4, GFR 15-29 ml/min (HCC)   5. Coronary artery disease involving native heart without angina pectoris, unspecified vessel or lesion type     Prior combined heart failure/cardiomyopathy, now with recovered EF -EF 25-30% 09/2019 -echo 05/19/20 reported as EF 60-65%. While it does not appear quite that vigorous to me, it is significantly improved from prior echo -reports doing well on torsemide 40 mg in the AM and 20 mg in the PM -continue metoprolol succinate -no room for ACEi/ARB/ARNI/MRA given history of hypotension and CKD   Atrial Fibrillation, permanent:  -drastically improved rate control since  TAVR. Had been extremely difficult to control rate prior.  -tolerating metoprolol succinate 50 mg daily, increased dose leads to worsening hypotension -not controlled on amiodarone in the past (and with liver/lung pathology, would avoid), no diltiazem given prior low EF, discussed digoxin in the past but now rate controlled -tolerating apixaban 2.5 mg BID (reduced for age, Cr). 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 -no chest pain -tolerating atorvastatin, no aspirin given apixaban for afib   Hyperlipidemia: -Goal LDL <70 -[facility] -continue atorvastatin   Mitral Regurgitation: h/o MV repair. Mild-moderate on TAVR TEE   Aortic Stenosis, severe, s/p TAVR:  -has endocarditis prophylaxis ordered  Follow up:  6 months or sooner as needed  Orders Placed This Encounter  Procedures   EKG 12-Lead   No orders of the defined types were placed in this encounter.  Patient Instructions  Medication Instructions:  Your Physician recommend you continue on your current medication as directed.    *If you need a refill on your cardiac medications before your next appointment, please call your pharmacy*   Lab Work: None ordered today   Testing/Procedures: None ordered today   Follow-Up: At [facility] you and your health needs are our priority.  As part of our continuing mission to provide you with exceptional heart care, we have created designated Provider Care Teams.  These Care Teams include your primary Cardiologist (physician) and Advanced Practice Providers (APPs -  Physician Assistants and Nurse Practitioners) who all work together to provide you with the care you need, when you need it.  We recommend signing up for the patient portal called "MyChart".  Sign up information is  provided on this After Visit Summary.  MyChart is used to connect with patients for  Virtual Visits (Telemedicine).  Patients are able to view lab/test results, encounter notes, upcoming appointments, etc.  Non-urgent messages can be sent to your provider as well.   To learn more about what you can do with MyChart, go to [email or web address].    Your next appointment:   6 month(s)  The format for your next appointment:   In Person  Provider:   Buford Dresser, MD         [facility] Stumpf,acting as a scribe for Buford Dresser, MD.,have documented all relevant documentation on the behalf of Buford Dresser, MD,as directed by  Buford Dresser, MD while in the presence of Buford Dresser, MD.  I, Buford Dresser, MD, have reviewed all documentation for this visit. The documentation on 01/17/22 for the exam, diagnosis, procedures, and orders are all accurate and complete.   Signed, Buford Dresser, MD  01/17/2022   [facility]

## 2022-01-24 DIAGNOSIS — Z89011 Acquired absence of right thumb: Secondary | ICD-10-CM | POA: Diagnosis not present

## 2022-01-24 DIAGNOSIS — I10 Essential (primary) hypertension: Secondary | ICD-10-CM | POA: Diagnosis not present

## 2022-02-02 DIAGNOSIS — N183 Chronic kidney disease, stage 3 unspecified: Secondary | ICD-10-CM | POA: Diagnosis not present

## 2022-02-02 DIAGNOSIS — G4700 Insomnia, unspecified: Secondary | ICD-10-CM | POA: Diagnosis not present

## 2022-02-02 DIAGNOSIS — E039 Hypothyroidism, unspecified: Secondary | ICD-10-CM | POA: Diagnosis not present

## 2022-02-02 DIAGNOSIS — I5030 Unspecified diastolic (congestive) heart failure: Secondary | ICD-10-CM | POA: Diagnosis not present

## 2022-02-02 DIAGNOSIS — I10 Essential (primary) hypertension: Secondary | ICD-10-CM | POA: Diagnosis not present

## 2022-02-02 DIAGNOSIS — E7800 Pure hypercholesterolemia, unspecified: Secondary | ICD-10-CM | POA: Diagnosis not present

## 2022-02-02 DIAGNOSIS — I480 Paroxysmal atrial fibrillation: Secondary | ICD-10-CM | POA: Diagnosis not present

## 2022-02-12 DIAGNOSIS — N184 Chronic kidney disease, stage 4 (severe): Secondary | ICD-10-CM | POA: Diagnosis not present

## 2022-02-15 ENCOUNTER — Encounter (HOSPITAL_BASED_OUTPATIENT_CLINIC_OR_DEPARTMENT_OTHER): Payer: Self-pay | Admitting: Cardiology

## 2022-02-21 DIAGNOSIS — E785 Hyperlipidemia, unspecified: Secondary | ICD-10-CM | POA: Diagnosis not present

## 2022-02-21 DIAGNOSIS — D631 Anemia in chronic kidney disease: Secondary | ICD-10-CM | POA: Diagnosis not present

## 2022-02-21 DIAGNOSIS — N184 Chronic kidney disease, stage 4 (severe): Secondary | ICD-10-CM | POA: Diagnosis not present

## 2022-02-21 DIAGNOSIS — E875 Hyperkalemia: Secondary | ICD-10-CM | POA: Diagnosis not present

## 2022-02-21 DIAGNOSIS — I509 Heart failure, unspecified: Secondary | ICD-10-CM | POA: Diagnosis not present

## 2022-02-21 DIAGNOSIS — N2581 Secondary hyperparathyroidism of renal origin: Secondary | ICD-10-CM | POA: Diagnosis not present

## 2022-02-21 DIAGNOSIS — I129 Hypertensive chronic kidney disease with stage 1 through stage 4 chronic kidney disease, or unspecified chronic kidney disease: Secondary | ICD-10-CM | POA: Diagnosis not present

## 2022-04-09 DIAGNOSIS — H52203 Unspecified astigmatism, bilateral: Secondary | ICD-10-CM | POA: Diagnosis not present

## 2022-04-09 DIAGNOSIS — H40013 Open angle with borderline findings, low risk, bilateral: Secondary | ICD-10-CM | POA: Diagnosis not present

## 2022-04-09 DIAGNOSIS — Z961 Presence of intraocular lens: Secondary | ICD-10-CM | POA: Diagnosis not present

## 2022-05-07 DIAGNOSIS — E7800 Pure hypercholesterolemia, unspecified: Secondary | ICD-10-CM | POA: Diagnosis not present

## 2022-05-07 DIAGNOSIS — N184 Chronic kidney disease, stage 4 (severe): Secondary | ICD-10-CM | POA: Diagnosis not present

## 2022-05-07 DIAGNOSIS — I10 Essential (primary) hypertension: Secondary | ICD-10-CM | POA: Diagnosis not present

## 2022-05-07 DIAGNOSIS — I5022 Chronic systolic (congestive) heart failure: Secondary | ICD-10-CM | POA: Diagnosis not present

## 2022-05-07 DIAGNOSIS — E039 Hypothyroidism, unspecified: Secondary | ICD-10-CM | POA: Diagnosis not present

## 2022-05-07 DIAGNOSIS — I5030 Unspecified diastolic (congestive) heart failure: Secondary | ICD-10-CM | POA: Diagnosis not present

## 2022-05-07 DIAGNOSIS — K219 Gastro-esophageal reflux disease without esophagitis: Secondary | ICD-10-CM | POA: Diagnosis not present

## 2022-05-23 ENCOUNTER — Telehealth: Payer: Self-pay | Admitting: Cardiology

## 2022-05-23 ENCOUNTER — Other Ambulatory Visit: Payer: Self-pay

## 2022-05-23 ENCOUNTER — Encounter (HOSPITAL_BASED_OUTPATIENT_CLINIC_OR_DEPARTMENT_OTHER): Payer: Self-pay

## 2022-05-23 ENCOUNTER — Emergency Department (HOSPITAL_BASED_OUTPATIENT_CLINIC_OR_DEPARTMENT_OTHER)
Admission: EM | Admit: 2022-05-23 | Discharge: 2022-05-23 | Disposition: A | Discharge status: Home / Self Care | Payer: PPO | Attending: Emergency Medicine | Admitting: Emergency Medicine

## 2022-05-23 ENCOUNTER — Emergency Department (HOSPITAL_BASED_OUTPATIENT_CLINIC_OR_DEPARTMENT_OTHER): Payer: PPO

## 2022-05-23 DIAGNOSIS — I130 Hypertensive heart and chronic kidney disease with heart failure and stage 1 through stage 4 chronic kidney disease, or unspecified chronic kidney disease: Secondary | ICD-10-CM | POA: Insufficient documentation

## 2022-05-23 DIAGNOSIS — J101 Influenza due to other identified influenza virus with other respiratory manifestations: Secondary | ICD-10-CM | POA: Diagnosis not present

## 2022-05-23 DIAGNOSIS — G4733 Obstructive sleep apnea (adult) (pediatric): Secondary | ICD-10-CM | POA: Insufficient documentation

## 2022-05-23 DIAGNOSIS — Z951 Presence of aortocoronary bypass graft: Secondary | ICD-10-CM | POA: Diagnosis not present

## 2022-05-23 DIAGNOSIS — Z20822 Contact with and (suspected) exposure to covid-19: Secondary | ICD-10-CM | POA: Diagnosis not present

## 2022-05-23 DIAGNOSIS — R531 Weakness: Secondary | ICD-10-CM | POA: Diagnosis not present

## 2022-05-23 DIAGNOSIS — Z8673 Personal history of transient ischemic attack (TIA), and cerebral infarction without residual deficits: Secondary | ICD-10-CM | POA: Diagnosis not present

## 2022-05-23 DIAGNOSIS — J111 Influenza due to unidentified influenza virus with other respiratory manifestations: Secondary | ICD-10-CM | POA: Diagnosis not present

## 2022-05-23 DIAGNOSIS — I480 Paroxysmal atrial fibrillation: Secondary | ICD-10-CM | POA: Insufficient documentation

## 2022-05-23 DIAGNOSIS — Z79899 Other long term (current) drug therapy: Secondary | ICD-10-CM | POA: Diagnosis not present

## 2022-05-23 DIAGNOSIS — N183 Chronic kidney disease, stage 3 unspecified: Secondary | ICD-10-CM | POA: Diagnosis not present

## 2022-05-23 DIAGNOSIS — I2510 Atherosclerotic heart disease of native coronary artery without angina pectoris: Secondary | ICD-10-CM | POA: Insufficient documentation

## 2022-05-23 DIAGNOSIS — I5042 Chronic combined systolic (congestive) and diastolic (congestive) heart failure: Secondary | ICD-10-CM | POA: Insufficient documentation

## 2022-05-23 DIAGNOSIS — Z7901 Long term (current) use of anticoagulants: Secondary | ICD-10-CM | POA: Diagnosis not present

## 2022-05-23 LAB — CBC WITH DIFFERENTIAL/PLATELET
Abs Immature Granulocytes: 0.02 10*3/uL (ref 0.00–0.07)
Basophils Absolute: 0 10*3/uL (ref 0.0–0.1)
Basophils Relative: 0 %
Eosinophils Absolute: 0 10*3/uL (ref 0.0–0.5)
Eosinophils Relative: 0 %
HCT: 45.8 % (ref 39.0–52.0)
Hemoglobin: 15 g/dL (ref 13.0–17.0)
Immature Granulocytes: 0 %
Lymphocytes Relative: 9 %
Lymphs Abs: 0.8 10*3/uL (ref 0.7–4.0)
MCH: 29 pg (ref 26.0–34.0)
MCHC: 32.8 g/dL (ref 30.0–36.0)
MCV: 88.4 fL (ref 80.0–100.0)
Monocytes Absolute: 1.3 10*3/uL — ABNORMAL HIGH (ref 0.1–1.0)
Monocytes Relative: 16 %
Neutro Abs: 6.2 10*3/uL (ref 1.7–7.7)
Neutrophils Relative %: 75 %
Platelets: 82 10*3/uL — ABNORMAL LOW (ref 150–400)
RBC: 5.18 MIL/uL (ref 4.22–5.81)
RDW: 14.3 % (ref 11.5–15.5)
Smear Review: NORMAL
WBC: 8.3 10*3/uL (ref 4.0–10.5)
nRBC: 0 % (ref 0.0–0.2)

## 2022-05-23 LAB — URINALYSIS, W/ REFLEX TO CULTURE (INFECTION SUSPECTED)
Bacteria, UA: NONE SEEN
Bilirubin Urine: NEGATIVE
Glucose, UA: NEGATIVE mg/dL
Ketones, ur: NEGATIVE mg/dL
Leukocytes,Ua: NEGATIVE
Nitrite: NEGATIVE
Specific Gravity, Urine: 1.01 (ref 1.005–1.030)
pH: 5.5 (ref 5.0–8.0)

## 2022-05-23 LAB — COMPREHENSIVE METABOLIC PANEL
ALT: 15 U/L (ref 0–44)
AST: 30 U/L (ref 15–41)
Albumin: 4.7 g/dL (ref 3.5–5.0)
Alkaline Phosphatase: 59 U/L (ref 38–126)
Anion gap: 12 (ref 5–15)
BUN: 34 mg/dL — ABNORMAL HIGH (ref 8–23)
CO2: 30 mmol/L (ref 22–32)
Calcium: 10.4 mg/dL — ABNORMAL HIGH (ref 8.9–10.3)
Chloride: 99 mmol/L (ref 98–111)
Creatinine, Ser: 2.68 mg/dL — ABNORMAL HIGH (ref 0.61–1.24)
GFR, Estimated: 22 mL/min — ABNORMAL LOW (ref >60)
Glucose, Bld: 94 mg/dL (ref 70–99)
Potassium: 3.9 mmol/L (ref 3.5–5.1)
Sodium: 141 mmol/L (ref 135–145)
Total Bilirubin: 1 mg/dL (ref 0.3–1.2)
Total Protein: 8.1 g/dL (ref 6.5–8.1)

## 2022-05-23 LAB — RESP PANEL BY RT-PCR (RSV, FLU A&B, COVID)  RVPGX2
Influenza A by PCR: POSITIVE — AB
Influenza B by PCR: NEGATIVE
Resp Syncytial Virus by PCR: NEGATIVE
SARS Coronavirus 2 by RT PCR: NEGATIVE

## 2022-05-23 NOTE — Discharge Instructions (Addendum)
Your creatinine was 2.68 today.  Follow-up with your nephrologist.

## 2022-05-23 NOTE — Telephone Encounter (Signed)
Pt wife called in stating pt fell last night. She states he did not pass out or black out. She states he is fatigue and has  a loss of appetite. Please advise. This morning  -100/55 hr 125

## 2022-05-23 NOTE — ED Provider Notes (Signed)
[facility] Provider Note   CSN: [id number] Arrival date & time: 05/23/22  1051     History  Chief Complaint  Patient presents with   [name] is a 89 y.o. male.   Fall    Patient presents with generalized weakness and fall.  Reportedly slid out of bed on the floor last night.  Has been generally weak.  Normally able to walk around but more generally weak.  No chest pain.  Does have a history atrial fibrillation is on Eliquis.  Did not hit his head with the fall.  Has had a decreased appetite today.  He is on Eliquis.    He has permanent A-fib.    Past Medical History:  Diagnosis Date   Adenomatous colon polyp    CAD (coronary artery disease)    a. S/P CABG x 1 @ time of MV annuloplasty;  b. 02/2010 ETT: neg for ischemia.   Carotid artery stenosis    1-39% by dopplers 10/2016   Chronic combined systolic (congestive) and diastolic (congestive) heart failure (HCC)    CKD (chronic kidney disease), stage III ([place])    Current use of long term anticoagulation    a. Coumadin in setting of afib.   Diverticulosis    Dyslipidemia    Dysrhythmia    A-fib   Essential hypertension    H/O mitral valve repair 09/16/2002   Complex valvuloplasty including quadrangular resection of posterior leaflet with artificial Gore-tex neochords and 28 mm Seguin ring annuloplasty - Dr Servando Snare   Hemorrhoids    Hyperlipidemia    Hypertension    Permanent atrial fibrillation ([place])    a. CHA2DS2VASc = 6 -->chronic coumadin;  b. Prev on amio-->d/c 2/2 hypothyroidism. >> resumed 5/16. c. Notes from 2017 indicate interstitial lung disease by pulm appt, question amio toxicity, also increased liver attenuation. Rate control pursued and amiodarone discontinued.   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 Authorizing Provider  apixaban (ELIQUIS) 2.5 MG TABS tablet Take 1 tablet (2.5 mg total) by mouth 2 (two) times daily. 02/20/21  Yes Buford Dresser, MD  atorvastatin (LIPITOR) 20 MG tablet Take by mouth. Every Monday, Wednesday and Friday 05/17/21  Yes [provider]  levothyroxine (SYNTHROID) 75 MCG tablet Take 75 mcg by mouth daily before breakfast.    Yes [provider]  metoprolol succinate (TOPROL-XL) 50 MG 24 hr tablet TAKE ONE TABLET BY MOUTH ONE TIME DAILY 11/10/19  Yes Buford Dresser, MD  Polyethyl Glycol-Propyl Glycol (SYSTANE OP) Place 1 drop into both eyes 3 (three) times daily as needed (dry/irritated eyes.).    Yes [provider]  torsemide (DEMADEX) 20 MG tablet Take 20-40 mg by mouth See admin instructions. Take 2 tablets (40 mg) by mouth in the morning & take 1 tablet (20 mg) by mouth at night.   Yes [provider]  vitamin B-12 (CYANOCOBALAMIN) 1000 MCG tablet Take 1,000 mcg by mouth daily.    Yes [provider]  hydrocortisone cream 1 % 1 application to affected area as needed for hemorrhoids    [provider]      Allergies    Asa [aspirin], Flomax [tamsulosin], Lipitor [atorvastatin], Mirtazapine, Pirfenidone, and Tramadol    Review of Systems   Review of Systems  Physical Exam Updated Vital Signs BP 131/71   Pulse (!) 113   Temp 98.2 F (36.8 C) (Oral)   Resp (!) 26   Ht '5\' 8"'$  (1.727 m)   Wt 80.4 kg   SpO2 97%   BMI 26.95 kg/m  Physical Exam Vitals reviewed.  HENT:     Head: Normocephalic.  Eyes:     Extraocular Movements: Extraocular movements intact.  Cardiovascular:     Rate and Rhythm: Tachycardia present. Rhythm irregular.  Pulmonary:     Breath  sounds: No wheezing or rhonchi.  Abdominal:     Tenderness: There is no abdominal tenderness.  Musculoskeletal:        General: No tenderness.     Cervical back: Neck supple.     Right lower leg: No edema.     Left lower leg: No edema.  Skin:    Capillary Refill: Capillary refill takes less than 2 seconds.  Neurological:     Mental Status: He is alert and oriented to person, place, and time.     ED Results / Procedures / Treatments   Labs (all labs ordered are listed, but only abnormal results are displayed) Labs Reviewed  RESP PANEL BY RT-PCR (RSV, FLU A&B, COVID)  RVPGX2 - Abnormal; Notable for the following components:      Result Value   Influenza A by PCR POSITIVE (*)    All other components within normal limits  COMPREHENSIVE METABOLIC PANEL - Abnormal; Notable for the following components:   BUN 34 (*)    Creatinine, Ser 2.68 (*)    Calcium 10.4 (*)    GFR, Estimated 22 (*)    All other components within normal limits  CBC WITH DIFFERENTIAL/PLATELET - Abnormal; Notable for the following components:   Platelets 82 (*)    Monocytes Absolute 1.3 (*)    All other components within normal limits  URINALYSIS, W/ REFLEX TO CULTURE (INFECTION SUSPECTED) - Abnormal; Notable for the following components:   Hgb urine dipstick SMALL (*)    Protein, ur TRACE (*)    All other components within normal limits    EKG EKG Interpretation  Date/Time:  Wednesday May 23 2022 11:08:42 EST Ventricular Rate:  111 PR Interval:    QRS Duration: 117 QT Interval:  357 QTC Calculation: 486 R Axis:   119 Text Interpretation: Atrial fibrillation Nonspecific intraventricular conduction delay Borderline repolarization abnormality Confirmed by Davonna Belling [phone]) on 05/23/2022 11:44:43 AM  Radiology DG Chest Portable 1 View  Result Date: 05/23/2022 CLINICAL DATA:  Weakness.  History of atrial fibrillation EXAM: PORTABLE CHEST 1 VIEW COMPARISON:  X-ray 12/03/2019 and older FINDINGS:  Status post median sternotomy. Status post TAVR. Borderline cardiopericardial silhouette with some central vascular congestion. No pneumothorax, effusion or edema. Overlapping cardiac leads. Small area of calcification in the right midlung is stable. IMPRESSION: Postop chest.  Status post TAVR. Borderline size heart with some central vascular congestion Electronically Signed   By: Jill Side M.D.   On: 05/23/2022 11:35    Procedures Procedures    Medications Ordered in ED Medications - No data to display  ED Course/ Medical Decision Making/ A&P  Medical Decision Making Amount and/or Complexity of Data Reviewed Labs: ordered. Radiology: ordered.     Patient with generalized weakness and fall.  Decreased appetite.  Differential diagnosis includes dehydration,   general weakness, electrode abnormality I have reviewed recent cardiology note.  Will get basic blood work chest x-ray.  No dysuria.  Will also get flu testing due to cough.  Had negative COVID test at home today.  Found to have influenza.  Blood work otherwise reassuring but creatinine is somewhat elevated 2.68.  Unsure of baseline.  Was around 2 the last time was checked 2 years ago but has been seeing nephrology since.  However I think the fluids do main cause at this time.  Mild tachycardia with the A-fib.  Chronic A-fib however.  Discharge home with outpatient follow-up.  Discussed with patient and his wife.  Reviewed recent GI note.       Final Clinical Impression(s) / ED Diagnoses Final diagnoses:  Influenza    Rx / DC Orders ED Discharge Orders     None         Davonna Belling, MD 05/23/22 1414

## 2022-05-23 NOTE — ED Triage Notes (Signed)
Patient here POV from Home.  Endorses getting into bed Last PM when he slid out accidentally and fell onto carpeted floor.  Assisted by Family off the Floor after about an hour so.   Seeks Evaluation as today he felt weak. Noted some tachycardia at 125. Does take Eliquis. No Head injury. No Pain.   NAD Noted during Triage. A&Ox4. GCS 15. Ambulatory.

## 2022-05-23 NOTE — Telephone Encounter (Signed)
Returned call to patient's who described pt lowering to the floor while trying to get in bed last night.  She describes patient slid down the side of the bed to the floor. Pt. has experienced fatigue and lack of apetite recently.  She provided Bp 100/55 and HR 125.  Due to hx of Afib and eliquis recommended patient come to the ED.  Ms. Youngs was agreeable. Georgana Curio MHA RN CCM

## 2022-05-24 DIAGNOSIS — E039 Hypothyroidism, unspecified: Secondary | ICD-10-CM | POA: Diagnosis not present

## 2022-05-24 DIAGNOSIS — I10 Essential (primary) hypertension: Secondary | ICD-10-CM | POA: Diagnosis not present

## 2022-05-24 DIAGNOSIS — I5030 Unspecified diastolic (congestive) heart failure: Secondary | ICD-10-CM | POA: Diagnosis not present

## 2022-05-24 DIAGNOSIS — I5022 Chronic systolic (congestive) heart failure: Secondary | ICD-10-CM | POA: Diagnosis not present

## 2022-05-24 DIAGNOSIS — E7800 Pure hypercholesterolemia, unspecified: Secondary | ICD-10-CM | POA: Diagnosis not present

## 2022-05-24 DIAGNOSIS — N184 Chronic kidney disease, stage 4 (severe): Secondary | ICD-10-CM | POA: Diagnosis not present

## 2022-05-24 DIAGNOSIS — K219 Gastro-esophageal reflux disease without esophagitis: Secondary | ICD-10-CM | POA: Diagnosis not present

## 2022-05-28 DIAGNOSIS — H40013 Open angle with borderline findings, low risk, bilateral: Secondary | ICD-10-CM | POA: Diagnosis not present

## 2022-06-04 DIAGNOSIS — E7800 Pure hypercholesterolemia, unspecified: Secondary | ICD-10-CM | POA: Diagnosis not present

## 2022-06-04 DIAGNOSIS — E039 Hypothyroidism, unspecified: Secondary | ICD-10-CM | POA: Diagnosis not present

## 2022-06-04 DIAGNOSIS — N184 Chronic kidney disease, stage 4 (severe): Secondary | ICD-10-CM | POA: Diagnosis not present

## 2022-06-04 DIAGNOSIS — Z0000 Encounter for general adult medical examination without abnormal findings: Secondary | ICD-10-CM | POA: Diagnosis not present

## 2022-06-04 DIAGNOSIS — D6869 Other thrombophilia: Secondary | ICD-10-CM | POA: Diagnosis not present

## 2022-06-04 DIAGNOSIS — I10 Essential (primary) hypertension: Secondary | ICD-10-CM | POA: Diagnosis not present

## 2022-06-04 DIAGNOSIS — R7303 Prediabetes: Secondary | ICD-10-CM | POA: Diagnosis not present

## 2022-06-04 DIAGNOSIS — N2581 Secondary hyperparathyroidism of renal origin: Secondary | ICD-10-CM | POA: Diagnosis not present

## 2022-06-04 DIAGNOSIS — I4821 Permanent atrial fibrillation: Secondary | ICD-10-CM | POA: Diagnosis not present

## 2022-06-08 ENCOUNTER — Ambulatory Visit
Admission: EM | Admit: 2022-06-08 | Discharge: 2022-06-08 | Disposition: A | Discharge status: Home / Self Care | Payer: PPO | Attending: Physician Assistant | Admitting: Physician Assistant

## 2022-06-08 DIAGNOSIS — L989 Disorder of the skin and subcutaneous tissue, unspecified: Secondary | ICD-10-CM

## 2022-06-08 MED ORDER — LIDOCAINE 4 % EX PTCH: MEDICATED_PATCH | CUTANEOUS | 1 refills | Status: DC

## 2022-06-08 NOTE — Discharge Instructions (Addendum)
See the dermatologist as scheduled.  See the dermatologist for evaluation and removal of skin lesions

## 2022-06-08 NOTE — ED Triage Notes (Signed)
Pt c/o dry spots to the top of his head that are causing pain.

## 2022-06-13 NOTE — ED Provider Notes (Signed)
[facility]    CSN: [id number] Arrival date & time: 06/08/22  1455      History   Chief Complaint No chief complaint on file.   HPI [name] is a 89 y.o. male.   Complains of lesions to his scalp.  Patient reports that these lesions are causing him discomfort and he cannot sleep.  Patient's wife reports that she has scheduled him an appointment to see [facility] dermatology.  She reports she was told that they would have to call back with a time for patient to be seen.  Patient denies any new complaints he has not had any fever or chills     Past Medical History:  Diagnosis Date   Adenomatous colon polyp    CAD (coronary artery disease)    a. S/P CABG x 1 @ time of MV annuloplasty;  b. 02/2010 ETT: neg for ischemia.   Carotid artery stenosis    1-39% by dopplers 10/2016   Chronic combined systolic (congestive) and diastolic (congestive) heart failure (HCC)    CKD (chronic kidney disease), stage III ([place])    Current use of long term anticoagulation    a. Coumadin in setting of afib.   Diverticulosis    Dyslipidemia    Dysrhythmia    A-fib   Essential hypertension    H/O mitral valve repair 09/16/2002   Complex valvuloplasty including quadrangular resection of posterior leaflet with artificial Gore-tex neochords and 28 mm Seguin ring annuloplasty - Dr Servando Snare   Hemorrhoids    Hyperlipidemia    Hypertension    Permanent atrial fibrillation ([place])    a. CHA2DS2VASc = 6 -->chronic coumadin;  b. Prev on amio-->d/c 2/2 hypothyroidism. >> resumed 5/16. c. Notes from 2017 indicate interstitial lung disease by pulm appt, question amio toxicity, also increased liver attenuation. Rate control pursued and amiodarone discontinued.   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   Thrombocytopenia ([place])    Sleep apnea 12/2018   ILD (interstitial lung disease) ([place]) 05/25/2016   External hemorrhoids 08/02/2015   Severe aortic stenosis 07/08/2015   Carotid artery stenosis 11/22/2014   Chronic anticoagulation 04/13/2014   Chronic kidney disease (CKD), stage III (moderate) (HCC) 03/02/2014   Coronary artery disease involving native coronary artery of native heart without angina pectoris 03/02/2014   Permanent atrial fibrillation 02/07/2014   Acute on chronic combined systolic and diastolic CHF (congestive heart failure) ([place]) 02/07/2014   Dyslipidemia 02/07/2014   Generalized weakness 02/07/2014   H/O mitral valve repair 09/16/2002   S/P CABG x 1 09/16/2002    Past Surgical History:  Procedure Laterality Date   CATARACT EXTRACTION W/ INTRAOCULAR LENS  IMPLANT, BILATERAL     per patient about 4 years ago   CORONARY ARTERY BYPASS GRAFT  2004   ELECTROMAGNETIC NAVIGATION BROCHOSCOPY  10/14/2019   Procedure: NAVIGATION BRONCHOSCOPY;  Surgeon: Garner Nash, DO;  Location: [facility];  Service: Pulmonary;;   INTERCOSTAL NERVE BLOCK Right 10/14/2019   Procedure: INTERCOSTAL NERVE BLOCK;  Surgeon: Grace Isaac, MD;  Location: [facility];  Service: Thoracic;  Laterality: Right;   INTRAOPERATIVE TRANSTHORACIC ECHOCARDIOGRAM N/A 05/26/2019   Procedure: INTRAOPERATIVE TRANSTHORACIC ECHOCARDIOGRAM;  Surgeon: Sherren Mocha, MD;  Location: [facility];  Service: Open Heart Surgery;  Laterality: N/A;   MITRAL VALVE REPAIR     RIGHT/LEFT HEART CATH AND CORONARY/GRAFT ANGIOGRAPHY N/A 04/28/2019   Procedure: RIGHT/LEFT  HEART CATH AND CORONARY/GRAFT ANGIOGRAPHY;  Surgeon: Sherren Mocha, MD;  Location: [facility];  Service: Cardiovascular;  Laterality: N/A;   SUBMUCOSAL TATTOO INJECTION  10/14/2019   Procedure: ENDOBRONCHIAL DYE INJECTION;  Surgeon: Garner Nash, DO;  Location: [facility];  Service: Pulmonary;;   TEE WITHOUT CARDIOVERSION N/A 05/20/2019   Procedure: TRANSESOPHAGEAL ECHOCARDIOGRAM (TEE);  Surgeon: Donato Heinz, MD;  Location[facility];  Service: Cardiovascular;  Laterality: N/A;   TRANSCATHETER AORTIC VALVE REPLACEMENT, TRANSFEMORAL N/A 05/26/2019   Procedure: TRANSCATHETER AORTIC VALVE REPLACEMENT, TRANSFEMORAL;  Surgeon: Sherren Mocha, MD;  Location: [facility];  Service: Open Heart Surgery;  Laterality: N/A;   VIDEO BRONCHOSCOPY WITH ENDOBRONCHIAL NAVIGATION N/A 10/14/2019   Procedure: VIDEO BRONCHOSCOPY;  Surgeon: Garner Nash, DO;  Location: [facility];  Service: Pulmonary;  Laterality: N/A;       Home Medications    Prior to Admission medications   Medication Sig Start Date End Date Taking? Authorizing Provider  lidocaine 4 % Cut patch in half and apply to painful areas 1 hour before bed 06/08/22  Yes Threasa Alpha, Hollace Kinnier, PA-C  apixaban (ELIQUIS) 2.5 MG TABS tablet Take 1 tablet (2.5 mg total) by mouth 2 (two) times daily. 02/20/21   Buford Dresser, MD  atorvastatin (LIPITOR) 20 MG tablet Take by mouth. Every Monday, Wednesday and Friday 05/17/21   [provider]  hydrocortisone cream 1 % 1 application to affected area as needed for hemorrhoids    [provider]  levothyroxine (SYNTHROID) 75 MCG tablet Take 75 mcg by mouth daily before breakfast.     [provider]  metoprolol succinate (TOPROL-XL) 50 MG 24 hr tablet TAKE ONE TABLET BY MOUTH ONE TIME DAILY 11/10/19   Buford Dresser, MD  Polyethyl Glycol-Propyl Glycol (SYSTANE OP) Place 1 drop into both eyes 3 (three) times daily as needed (dry/irritated eyes.).      [provider]  torsemide (DEMADEX) 20 MG tablet Take 20-40 mg by mouth See admin instructions. Take 2 tablets (40 mg) by mouth in the morning & take 1 tablet (20 mg) by mouth at night.    [provider]  vitamin B-12 (CYANOCOBALAMIN) 1000 MCG tablet Take 1,000 mcg by mouth daily.     [provider]    Family History Family History  Problem Relation Age of Onset   Stroke Mother        Deceased   Heart disease Father        Deceased   Heart failure Brother        Deceased   Stroke Brother     Social History Social History   Tobacco Use   Smoking status: Former    Packs/day: 0.10    Years: 2.00    Additional pack years: 0.00    Total pack years: 0.20    Types: Cigarettes    Quit date: 03/20/1955    Years since quitting: 67.2   Smokeless tobacco: Never  Vaping Use   Vaping Use: Never used  Substance Use Topics   Alcohol use: No    Alcohol/week: 0.0 standard drinks of alcohol   Drug use: No     Allergies   Asa [aspirin], Flomax [tamsulosin], Lipitor [atorvastatin], Mirtazapine, Pirfenidone, and Tramadol   Review of Systems Review of Systems  All other systems reviewed and are negative.    Physical Exam Triage Vital Signs ED Triage Vitals  Enc Vitals Group     BP 06/08/22 1555 134/81     Pulse Rate 06/08/22 1555 88     Resp 06/08/22 1555 18     Temp 06/08/22 1555 97.7 F (36.5 C)     Temp Source 06/08/22 1555 Oral     SpO2 06/08/22 1555 93 %     Weight --      Height --      Head Circumference --      Peak Flow --      Pain Score 06/08/22 1609 9     Pain Loc --      Pain Edu? --      Excl. in [place]? --    No data found.  Updated Vital Signs BP 134/81 (BP Location: Right Arm)   Pulse 88   Temp 97.7 F (36.5 C) (Oral)   Resp 18   SpO2 93%   Visual Acuity Right Eye Distance:   Left Eye Distance:   Bilateral Distance:    Right Eye Near:   Left Eye Near:    Bilateral Near:     Physical Exam Vitals reviewed.   Constitutional:      Appearance: Normal appearance.  Cardiovascular:     Rate and Rhythm: Normal rate.  Pulmonary:     Effort: Pulmonary effort is normal.  Musculoskeletal:        General: Normal range of motion.  Skin:    Comments: Scaled looking areas right forehead and right lateral scalp  Neurological:     General: No focal deficit present.     Mental Status: He is alert.  Psychiatric:        Mood and Affect: Mood normal.      UC Treatments / Results  Labs (all labs ordered are listed, but only abnormal results are displayed) Labs Reviewed - No data to display  EKG   Radiology No results found.  Procedures Procedures (including critical care time)  Medications Ordered in UC Medications - No data to display  Initial Impression / Assessment and Plan / UC Course  I have reviewed the triage vital signs and the nursing notes.  Pertinent labs & imaging results that were available during my care of the patient were reviewed by me and considered in my medical decision making (see chart for details).     I am concerned that patient has skin cancer.  I advised patient and his wife that he needs to see dermatology.  Patient is asking for something to help with the discomfort when he tries to sleep.  Patient is given lidocaine patches he and his wife were counseled on the importance of seeing dermatology. Final Clinical Impressions(s) / UC Diagnoses   Final diagnoses:  None     Discharge Instructions      See the dermatologist as scheduled.  See the dermatologist for evaluation and removal of skin lesions    ED Prescriptions     Medication Sig Dispense Auth. Provider   lidocaine 4 % Cut patch in half and apply to painful  areas 1 hour before bed 15 patch Fransico Meadow, [place]      PDMP not reviewed this encounter. An After Visit Summary was printed and given to the patient.    Fransico Meadow, [place] 06/13/22 1544

## 2022-07-13 DIAGNOSIS — C4442 Squamous cell carcinoma of skin of scalp and neck: Secondary | ICD-10-CM | POA: Diagnosis not present

## 2022-07-13 DIAGNOSIS — Z85828 Personal history of other malignant neoplasm of skin: Secondary | ICD-10-CM | POA: Diagnosis not present

## 2022-07-19 ENCOUNTER — Ambulatory Visit (INDEPENDENT_AMBULATORY_CARE_PROVIDER_SITE_OTHER): Payer: PPO | Admitting: Cardiology

## 2022-07-19 ENCOUNTER — Encounter (HOSPITAL_BASED_OUTPATIENT_CLINIC_OR_DEPARTMENT_OTHER): Payer: Self-pay | Admitting: Cardiology

## 2022-07-19 VITALS — BP 132/60 | HR 77 | Ht 68.0 in | Wt 173.4 lb

## 2022-07-19 DIAGNOSIS — Z9889 Other specified postprocedural states: Secondary | ICD-10-CM | POA: Diagnosis not present

## 2022-07-19 DIAGNOSIS — Z7901 Long term (current) use of anticoagulants: Secondary | ICD-10-CM

## 2022-07-19 DIAGNOSIS — D6869 Other thrombophilia: Secondary | ICD-10-CM | POA: Diagnosis not present

## 2022-07-19 DIAGNOSIS — N184 Chronic kidney disease, stage 4 (severe): Secondary | ICD-10-CM | POA: Diagnosis not present

## 2022-07-19 DIAGNOSIS — Z952 Presence of prosthetic heart valve: Secondary | ICD-10-CM

## 2022-07-19 DIAGNOSIS — I2510 Atherosclerotic heart disease of native coronary artery without angina pectoris: Secondary | ICD-10-CM | POA: Diagnosis not present

## 2022-07-19 DIAGNOSIS — I4821 Permanent atrial fibrillation: Secondary | ICD-10-CM

## 2022-07-19 NOTE — Patient Instructions (Signed)
Medication Instructions:  Your physician recommends that you continue on your current medications as directed. Please refer to the Current Medication list given to you today.  Follow-Up: At [facility], you and your health needs are our priority.  As part of our continuing mission to provide you with exceptional heart care, we have created designated Provider Care Teams.  These Care Teams include your primary Cardiologist (physician) and Advanced Practice Providers (APPs -  Physician Assistants and Nurse Practitioners) who all work together to provide you with the care you need, when you need it.  We recommend signing up for the patient portal called "MyChart".  Sign up information is provided on this After Visit Summary.  MyChart is used to connect with patients for Virtual Visits (Telemedicine).  Patients are able to view lab/test results, encounter notes, upcoming appointments, etc.  Non-urgent messages can be sent to your provider as well.   To learn more about what you can do with MyChart, go to [email or web address].    Your next appointment:   6 month(s)  Provider:   Bridgette Christopher, MD    

## 2022-07-19 NOTE — Progress Notes (Signed)
Cardiology Office Note:    Date:  07/19/2022   ID:  [name], DOB 11-26-1931, MRN [id number]  PCP:  Johny Blamer, MD  Cardiologist:  Jodelle Red, MD  Referring MD: Johny Blamer, MD   Chief Complaint:  Follow up  History of Present Illness:    [name] is a 89 y.o. male with a hx of CAD s/p CABG, mitral valve repair, atrial fibrillation on anticoagulation, interstitial lung disease, chronic systolic and diastolic heart failure, low flow low gradient aortic stenosis s/p TAVR who is seen in follow up today. He follows at the [place] in [place].   On 05/23/2022 he presented to the ED with complaints of generalized weakness and a fall, sliding out of the bed to the floor the night prior. He had a negative Covid-19 test at home. Workup in the ED revealed that he was positive for influenza. Blood work otherwise reassuring although creatinine was 2.68.  Today, he is accompanied by his wife.  He has been able to go walking for exercise. He also walks to the mailbox and back, about 240 steps without anginal symptoms.   Following with [facility]. 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  He denies any palpitations, chest pain, shortness of breath, or peripheral edema. No lightheadedness, headaches, syncope, orthopnea, or PND.   Past Medical History:  Diagnosis Date   Adenomatous colon polyp    CAD (coronary artery disease)    a. S/P CABG x 1 @ time of MV annuloplasty;  b. 02/2010 ETT: neg for ischemia.   Carotid artery stenosis    1-39% by dopplers 10/2016   Chronic combined systolic (congestive) and diastolic (congestive) heart failure (HCC)    CKD (chronic kidney disease), stage III (HCC)    Current use of long term anticoagulation    a. Coumadin in setting of afib.   Diverticulosis    Dyslipidemia    Dysrhythmia    A-fib   Essential hypertension     H/O mitral valve repair 09/16/2002   Complex valvuloplasty including quadrangular resection of posterior leaflet with artificial Gore-tex neochords and 28 mm Seguin ring annuloplasty - Dr Tyrone Sage   Hemorrhoids    Hyperlipidemia    Hypertension    Permanent atrial fibrillation (HCC)    a. CHA2DS2VASc = 6 -->chronic coumadin;  b. Prev on amio-->d/c 2/2 hypothyroidism. >> resumed 5/16. c. Notes from 2017 indicate interstitial lung disease by pulm appt, question amio toxicity, also increased liver attenuation. Rate control pursued and amiodarone discontinued.   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   s/p TAVR w/ a 29 mm Edwards Sapien 3 THV via the TF approach by Drs Excell Seltzer and Cornelius Moras   Sleep apnea 12/2018   per patient's spouse wears BIPAP "every other night or so"   Thrombocytopenia (HCC)    TIA (transient ischemic attack)    Past Surgical History:  Procedure Laterality Date   CATARACT EXTRACTION W/ INTRAOCULAR LENS  IMPLANT, BILATERAL     per patient about 4 years ago   CORONARY ARTERY BYPASS GRAFT  2004   ELECTROMAGNETIC NAVIGATION BROCHOSCOPY  10/14/2019   Procedure: NAVIGATION BRONCHOSCOPY;  Surgeon: Josephine Igo, DO;  Location: [facility] ENDOSCOPY;  Service: Pulmonary;;   INTERCOSTAL NERVE BLOCK Right 10/14/2019   Procedure: INTERCOSTAL NERVE BLOCK;  Surgeon: Delight Ovens, MD;  Location: [facility] OR;  Service: Thoracic;  Laterality: Right;   INTRAOPERATIVE TRANSTHORACIC ECHOCARDIOGRAM N/A 05/26/2019   Procedure: INTRAOPERATIVE TRANSTHORACIC ECHOCARDIOGRAM;  Surgeon: Tonny Bollman, MD;  Location[facility];  Service: Open Heart Surgery;  Laterality: N/A;   MITRAL VALVE REPAIR     RIGHT/LEFT HEART CATH AND CORONARY/GRAFT ANGIOGRAPHY N/A 04/28/2019   Procedure: RIGHT/LEFT HEART CATH AND CORONARY/GRAFT ANGIOGRAPHY;  Surgeon: Tonny Bollman, MD;  Location: [facility] INVASIVE CV LAB;  Service: Cardiovascular;  Laterality: N/A;   SUBMUCOSAL TATTOO  INJECTION  10/14/2019   Procedure: ENDOBRONCHIAL DYE INJECTION;  Surgeon: Josephine Igo, DO;  Location: [facility] ENDOSCOPY;  Service: Pulmonary;;   TEE WITHOUT CARDIOVERSION N/A 05/20/2019   Procedure: TRANSESOPHAGEAL ECHOCARDIOGRAM (TEE);  Surgeon: Little Ishikawa, MD;  Location: [facility] ENDOSCOPY;  Service: Cardiovascular;  Laterality: N/A;   TRANSCATHETER AORTIC VALVE REPLACEMENT, TRANSFEMORAL N/A 05/26/2019   Procedure: TRANSCATHETER AORTIC VALVE REPLACEMENT, TRANSFEMORAL;  Surgeon: Tonny Bollman, MD;  Location: [facility] OR;  Service: Open Heart Surgery;  Laterality: N/A;   VIDEO BRONCHOSCOPY WITH ENDOBRONCHIAL NAVIGATION N/A 10/14/2019   Procedure: VIDEO BRONCHOSCOPY;  Surgeon: Josephine Igo, DO;  Location: [facility] ENDOSCOPY;  Service: Pulmonary;  Laterality: N/A;     Current Meds  Medication Sig   apixaban (ELIQUIS) 2.5 MG TABS tablet Take 1 tablet (2.5 mg total) by mouth 2 (two) times daily.   atorvastatin (LIPITOR) 20 MG tablet Take by mouth. Every Monday, Wednesday and Friday   hydrocortisone cream 1 % 1 application to affected area as needed for hemorrhoids   levothyroxine (SYNTHROID) 75 MCG tablet Take 75 mcg by mouth daily before breakfast.    lidocaine 4 % Cut patch in half and apply to painful areas 1 hour before bed   metoprolol succinate (TOPROL-XL) 50 MG 24 hr tablet TAKE ONE TABLET BY MOUTH ONE TIME DAILY   Polyethyl Glycol-Propyl Glycol (SYSTANE OP) Place 1 drop into both eyes 3 (three) times daily as needed (dry/irritated eyes.).    torsemide (DEMADEX) 20 MG tablet Take 20-40 mg by mouth See admin instructions. Take 2 tablets (40 mg) by mouth in the morning & take 1 tablet (20 mg) by mouth at night.   vitamin B-12 (CYANOCOBALAMIN) 1000 MCG tablet Take 1,000 mcg by mouth daily.      Allergies:   Asa [aspirin], Flomax [tamsulosin], Lipitor [atorvastatin], Mirtazapine, Pirfenidone, and Tramadol   Social History   Tobacco Use   Smoking status: Former    Packs/day: 0.10    Years:  2.00    Additional pack years: 0.00    Total pack years: 0.20    Types: Cigarettes    Quit date: 03/20/1955    Years since quitting: 67.3   Smokeless tobacco: Never  Vaping Use   Vaping Use: Never used  Substance Use Topics   Alcohol use: No    Alcohol/week: 0.0 standard drinks of alcohol   Drug use: No     Family Hx: The patient's family history includes Heart disease in his father; Heart failure in his brother; Stroke in his brother and mother.  ROS:   Please see the history of present illness.    All other systems reviewed and are negative.  Prior CV studies:   The following studies were reviewed today:  Renal Artery Duplex 09/06/2020: Summary:  Renal:     Right: No evidence of right renal artery stenosis. Abnormal right         Resistive Index.  Left:  No evidence of left renal artery stenosis. Abnormal left  Resisitve Index.   Echo 05/19/20 1. Left ventricular ejection fraction, by estimation, is 60 to 65%. The  left ventricle has normal function. The left ventricle has no regional  wall motion abnormalities. Left ventricular diastolic function could not  be evaluated. Elevated left  ventricular end-diastolic pressure.   2. Right ventricular systolic function is normal. The right ventricular  size is normal. There is normal pulmonary artery systolic pressure.   3. S/P MV repair with quandrangular resection of the posterior mitral  valve leaflet and placement of neo chord. Mild to moderate mitral valve  regurgitation is present. No evidence of mitral stenosis. The mean MV  gradient is 3.11mmHg.   4. The aortic valve has been repaired/replaced. Aortic valve  regurgitation is not visualized. No aortic stenosis is present. There is a  29 mm Sapien prosthetic (TAVR) valve present in the aortic position.  Procedure Date: 05/26/2019. Aortic valve mean  gradient measures 6.0 mmHg. Aortic valve Vmax measures 1.48 m/s. DI 0.37.   5. The inferior vena cava is normal in  size with greater than 50%  respiratory variability, suggesting right atrial pressure of 3 mmHg.   Echo 10/06/19 1. Left ventricular ejection fraction, by estimation, is 25 to 30%. The  left ventricle has severely decreased function. The left ventricle  demonstrates global hypokinesis. There is mild left ventricular  hypertrophy. Left ventricular diastolic parameters   are indeterminate.   2. Right ventricular systolic function is mildly reduced. The right  ventricular size is normal. There is normal pulmonary artery systolic  pressure. The estimated right ventricular systolic pressure is 26.0 mmHg.   3. Left atrial size was moderately dilated.   4. The mitral valve has been repaired/replaced. Mild mitral valve  regurgitation. No evidence of mitral stenosis. The mean mitral valve  gradient is 2.0 mmHg with average heart rate of 60 bpm. There is a 28 mm  prosthetic annuloplasty ring present in the  mitral position.   5. The aortic valve has been repaired/replaced. Aortic valve  regurgitation is trivial. There is a 29 mm Edwards Edwards Sapien  prosthetic (TAVR) valve present in the aortic position. Echo findings are  consistent with a trivial perivalvular leak of the  aortic prosthesis. Aortic valve peak and mean gradients of 4 and 7 mmHg,  respectively. AVA 2.75 cm2.   6. Aortic dilatation noted. There is borderline dilatation at the level  of the sinuses of Valsalva measuring 38 mm. The ascending aorta measures  normal in size.   7. The inferior vena cava is normal in size with greater than 50%  respiratory variability, suggesting right atrial pressure of 3 mmHg.   Comparison(s): Changes from prior study are noted. 06/25/19: LVEF <20%, TAVR  with P/M gradients of 4 and 6 mmHg, trivial perivalvular leak.   Echo 06/25/19  1. Left ventricular ejection fraction, by estimation, is <20%. The left  ventricle has severely decreased function. The left ventricle demonstrates  global  hypokinesis. The left ventricular internal cavity size was mildly  dilated. Left ventricular  diastolic parameters are indeterminate.   2. S/p mitral valve repair. Mild-moderate mitral regurgitation. Mean  gradient 4 mmHg, mild mitral stenosis.   3. Bioprosthetic aortic valve s/p TAVR, 29 mm Edwards Sapien. Trivial  perivalvular regurgitation. Mean gradient 4 mmHg, no significant stenosis.   4. Left atrial size was moderately dilated.   5. Right ventricular systolic function is mildly reduced. The right  ventricular size is mildly enlarged. There is mildly elevated pulmonary  artery systolic  pressure. The estimated right ventricular systolic  pressure is 35.5 mmHg.   6. The inferior vena cava is normal in size with <50% respiratory  variability, suggesting right atrial pressure of 8 mmHg.   R/L Heart Cath 04/28/2019: 1.  Nonobstructive coronary disease with mild diffuse stenosis of the LAD, left circumflex, and RCA 2.  Total occlusion of the first OM branch of the circumflex with continued patency of the saphenous vein graft to OM 3.  Severe aortic stenosis with mean gradient 33 mmHg and calculated valve area 0.8 cm 4.  Large V waves consistent with severe mitral regurgitation   Recommendation: Continue multidisciplinary evaluation for this patient with severe low-flow low gradient aortic stenosis, probable severe mitral regurgitation with previous mitral repair, and progressive heart failure symptoms  Echo 01/13/19  1. Left ventricular ejection fraction, by visual estimation, is 20 to 25%. The left ventricle has severely decreased function. Moderate to severely increased left ventricular size. There is no left ventricular hypertrophy.  2. Left ventricular diastolic function could not be evaluated pattern of LV diastolic filling.  3. Global right ventricle has normal systolic function.The right ventricular size is mildly enlarged. No increase in right ventricular wall thickness.  4. Left  atrial size was moderately dilated.  5. Right atrial size was moderately dilated.  6. Moderate calcification of the mitral valve leaflet(s).  7. Moderate thickening of the mitral valve leaflet(s).  8. Severely decreased mobility of the mitral valve leaflets.  9. The mitral valve has been repaired/replaced. Moderate to severe mitral valve regurgitation. 10. The tricuspid valve is normal in structure. Tricuspid valve regurgitation is mild. 11. The aortic valve is abnormal Aortic valve regurgitation is mild by color flow Doppler. 12. There is Severe calcifcation of the aortic valve. 13. There is Severely thickening of the aortic valve. 14. Suspect low flow-low gradient AS given visual limited movement of aortic valve and severely reduced LV EF. 15. The pulmonic valve was grossly normal. Pulmonic valve regurgitation is mild to moderate by color flow Doppler. 16. Moderately elevated pulmonary artery systolic pressure. 17. The inferior vena cava is normal in size with <50% respiratory variability, suggesting right atrial pressure of 8 mmHg. 18. While heart rate much improved on this study, side by side comparison shows dilation of the LV and stable severely reduced LV EF. The aortic valve is severely thickened/calcified and visually appears to minimally open. Suspect low flow low gradient  severe AS, as dimensionless index 0.18. Prior MV repair with immobile posterior mitral valve leaflet.  Labs/Other Tests and Data Reviewed:    EKG: Personally reviewed today. 07/19/2022:  atrial fibrillation at 77 bpm 01/17/2022: atrial fibrillation at 69 bpm 07/20/2021: not ordered. 12/27/2020: atrial fibrillation at 71 bpm 4/22- Atrial fibrillation, rate 84 7/21- Atrial fibrillation   Recent Labs: 05/23/2022: ALT 15; BUN 34; Creatinine, Ser 2.68; Hemoglobin 15.0; Platelets 82; Potassium 3.9; Sodium 141   Recent Lipid Panel Lab Results  Component Value Date/Time   CHOL 193 06/17/2017 07:44 AM   TRIG 114  06/17/2017 07:44 AM   HDL 35 (L) 06/17/2017 07:44 AM   CHOLHDL 5.5 (H) 06/17/2017 07:44 AM   CHOLHDL 2.6 03/16/2016 09:03 AM   LDLCALC 135 (H) 06/17/2017 07:44 AM    Wt Readings from Last 3 Encounters:  07/19/22 173 lb 6.4 oz (78.7 kg)  05/23/22 177 lb 4 oz (80.4 kg)  01/17/22 177 lb 4.8 oz (80.4 kg)     Objective:    Vital Signs:  BP 132/60   Pulse  77   Ht 5\' 8"  (1.727 m)   Wt 173 lb 6.4 oz (78.7 kg)   SpO2 99%   BMI 26.37 kg/m    GEN: Well nourished, well developed in no acute distress HEENT: Normal, moist mucous membranes NECK: No JVD CARDIAC: irregularly irregular rhythm, normal S1 and S2, no rubs or gallops. 1/6 systolic murmur appreciated today. VASCULAR: Radial and DP pulses 2+ bilaterally. No carotid bruits RESPIRATORY:  Clear to auscultation bilaterally ABDOMEN: Soft, non-tender, non-distended MUSCULOSKELETAL:  Ambulates independently SKIN: Warm and dry, no edema NEUROLOGIC:  Alert and oriented x 3. No focal neuro deficits noted. PSYCHIATRIC:  Normal affect   ASSESSMENT & PLAN:    1. Permanent atrial fibrillation (HCC)   2. Secondary hypercoagulable state (HCC)   3. Long term current use of anticoagulant   4. S/P TAVR (transcatheter aortic valve replacement)   5. H/O mitral valve repair   6. CKD (chronic kidney disease) stage 4, GFR 15-29 ml/min (HCC)   7. Coronary artery disease involving native coronary artery of native heart without angina pectoris      Prior combined heart failure/cardiomyopathy, now with recovered EF -EF 25-30% 09/2019 -echo 05/19/20 reported as EF 60-65%. While it does not appear quite that vigorous to me, it is significantly improved from prior echo -reports doing well on torsemide 40 mg in the AM and 20 mg in the PM -continue metoprolol succinate -no room for ACEi/ARB/ARNI/MRA given history of hypotension and CKD   Atrial Fibrillation, permanent:  -drastically improved rate control since TAVR. Had been extremely difficult to  control rate prior.  -tolerating metoprolol succinate 50 mg daily, increased dose leads to worsening hypotension -not controlled on amiodarone in the past (and with liver/lung pathology, would avoid), no diltiazem given prior low EF, discussed digoxin in the past but now rate controlled -tolerating apixaban 2.5 mg BID (reduced for age, Cr). 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 -no chest pain -tolerating atorvastatin, no aspirin given apixaban for afib   Hyperlipidemia: -Goal LDL <70 -followed by [facility] -continue atorvastatin   Mitral Regurgitation: h/o MV repair. Mild-moderate on TAVR TEE   Aortic Stenosis, severe, s/p TAVR:  -has endocarditis prophylaxis ordered  Follow up:  6 months or sooner as needed  Medication Adjustments/Labs and Tests Ordered: Current medicines are reviewed at length with the patient today.  Concerns regarding medicines are outlined above.   Orders Placed This Encounter  Procedures   EKG 12-Lead   No orders of the defined types were placed in this encounter.  Patient Instructions  Medication Instructions:  Your physician recommends that you continue on your current medications as directed. Please refer to the Current Medication list given to you today.  Follow-Up: At [facility], you and your health needs are our priority.  As part of our continuing mission to provide you with exceptional heart care, we have created designated Provider Care Teams.  These Care Teams include your primary Cardiologist (physician) and Advanced Practice Providers (APPs -  Physician Assistants and Nurse Practitioners) who all work together to provide you with the care you need, when you need it.  We recommend signing up for the patient portal called "MyChart".  Sign up information  is provided on this After Visit Summary.  MyChart is used to connect with  patients for Virtual Visits (Telemedicine).  Patients are able to view lab/test results, encounter notes, upcoming appointments, etc.  Non-urgent messages can be sent to your provider as well.   To learn more about what you can do with MyChart, go to [email or web address].    Your next appointment:   6 month(s)  Provider:   Jodelle Red, MD     [facility],acting as a scribe for Jodelle Red, MD.,have documented all relevant documentation on the behalf of Jodelle Red, MD,as directed by  Jodelle Red, MD while in the presence of Jodelle Red, MD.  I, Jodelle Red, MD, have reviewed all documentation for this visit. The documentation on 07/19/22 for the exam, diagnosis, procedures, and orders are all accurate and complete.   Signed, Jodelle Red, MD  07/19/2022  [facility]

## 2022-07-26 ENCOUNTER — Inpatient Hospital Stay (HOSPITAL_BASED_OUTPATIENT_CLINIC_OR_DEPARTMENT_OTHER)
Admission: EM | Admit: 2022-07-26 | Discharge: 2022-07-28 | DRG: 554 | Disposition: A | Discharge status: Home / Self Care | Payer: PPO | Attending: Internal Medicine | Admitting: Internal Medicine

## 2022-07-26 ENCOUNTER — Other Ambulatory Visit: Payer: Self-pay

## 2022-07-26 ENCOUNTER — Ambulatory Visit (INDEPENDENT_AMBULATORY_CARE_PROVIDER_SITE_OTHER): Payer: PPO

## 2022-07-26 ENCOUNTER — Ambulatory Visit
Admission: EM | Admit: 2022-07-26 | Discharge: 2022-07-26 | Disposition: A | Discharge status: Home / Self Care | Payer: PPO | Attending: Nurse Practitioner | Admitting: Nurse Practitioner

## 2022-07-26 DIAGNOSIS — J849 Interstitial pulmonary disease, unspecified: Secondary | ICD-10-CM | POA: Diagnosis present

## 2022-07-26 DIAGNOSIS — D696 Thrombocytopenia, unspecified: Secondary | ICD-10-CM | POA: Diagnosis not present

## 2022-07-26 DIAGNOSIS — I2510 Atherosclerotic heart disease of native coronary artery without angina pectoris: Secondary | ICD-10-CM | POA: Diagnosis not present

## 2022-07-26 DIAGNOSIS — Z951 Presence of aortocoronary bypass graft: Secondary | ICD-10-CM | POA: Diagnosis not present

## 2022-07-26 DIAGNOSIS — M79645 Pain in left finger(s): Secondary | ICD-10-CM

## 2022-07-26 DIAGNOSIS — Z888 Allergy status to other drugs, medicaments and biological substances status: Secondary | ICD-10-CM | POA: Diagnosis not present

## 2022-07-26 DIAGNOSIS — Z8249 Family history of ischemic heart disease and other diseases of the circulatory system: Secondary | ICD-10-CM | POA: Diagnosis not present

## 2022-07-26 DIAGNOSIS — N1832 Chronic kidney disease, stage 3b: Secondary | ICD-10-CM | POA: Diagnosis present

## 2022-07-26 DIAGNOSIS — Z823 Family history of stroke: Secondary | ICD-10-CM | POA: Diagnosis not present

## 2022-07-26 DIAGNOSIS — D631 Anemia in chronic kidney disease: Secondary | ICD-10-CM | POA: Diagnosis not present

## 2022-07-26 DIAGNOSIS — D649 Anemia, unspecified: Secondary | ICD-10-CM | POA: Insufficient documentation

## 2022-07-26 DIAGNOSIS — Z885 Allergy status to narcotic agent status: Secondary | ICD-10-CM | POA: Diagnosis not present

## 2022-07-26 DIAGNOSIS — M7989 Other specified soft tissue disorders: Secondary | ICD-10-CM | POA: Diagnosis not present

## 2022-07-26 DIAGNOSIS — Z953 Presence of xenogenic heart valve: Secondary | ICD-10-CM | POA: Diagnosis not present

## 2022-07-26 DIAGNOSIS — Z87891 Personal history of nicotine dependence: Secondary | ICD-10-CM | POA: Diagnosis not present

## 2022-07-26 DIAGNOSIS — E785 Hyperlipidemia, unspecified: Secondary | ICD-10-CM | POA: Diagnosis not present

## 2022-07-26 DIAGNOSIS — D72829 Elevated white blood cell count, unspecified: Secondary | ICD-10-CM | POA: Diagnosis not present

## 2022-07-26 DIAGNOSIS — N183 Chronic kidney disease, stage 3 unspecified: Secondary | ICD-10-CM | POA: Diagnosis present

## 2022-07-26 DIAGNOSIS — L539 Erythematous condition, unspecified: Secondary | ICD-10-CM | POA: Diagnosis not present

## 2022-07-26 DIAGNOSIS — Z886 Allergy status to analgesic agent status: Secondary | ICD-10-CM | POA: Diagnosis not present

## 2022-07-26 DIAGNOSIS — Z79899 Other long term (current) drug therapy: Secondary | ICD-10-CM

## 2022-07-26 DIAGNOSIS — M109 Gout, unspecified: Principal | ICD-10-CM | POA: Diagnosis present

## 2022-07-26 DIAGNOSIS — Z9889 Other specified postprocedural states: Secondary | ICD-10-CM

## 2022-07-26 DIAGNOSIS — E039 Hypothyroidism, unspecified: Secondary | ICD-10-CM | POA: Diagnosis not present

## 2022-07-26 DIAGNOSIS — Z79890 Hormone replacement therapy (postmenopausal): Secondary | ICD-10-CM | POA: Diagnosis not present

## 2022-07-26 DIAGNOSIS — I130 Hypertensive heart and chronic kidney disease with heart failure and stage 1 through stage 4 chronic kidney disease, or unspecified chronic kidney disease: Secondary | ICD-10-CM | POA: Diagnosis present

## 2022-07-26 DIAGNOSIS — I5042 Chronic combined systolic (congestive) and diastolic (congestive) heart failure: Secondary | ICD-10-CM | POA: Diagnosis present

## 2022-07-26 DIAGNOSIS — I4821 Permanent atrial fibrillation: Secondary | ICD-10-CM | POA: Diagnosis not present

## 2022-07-26 DIAGNOSIS — L089 Local infection of the skin and subcutaneous tissue, unspecified: Secondary | ICD-10-CM | POA: Diagnosis not present

## 2022-07-26 DIAGNOSIS — Z952 Presence of prosthetic heart valve: Secondary | ICD-10-CM

## 2022-07-26 DIAGNOSIS — Z7901 Long term (current) use of anticoagulants: Secondary | ICD-10-CM | POA: Diagnosis not present

## 2022-07-26 DIAGNOSIS — Z8673 Personal history of transient ischemic attack (TIA), and cerebral infarction without residual deficits: Secondary | ICD-10-CM

## 2022-07-26 LAB — CBC
HCT: 41.9 % (ref 39.0–52.0)
Hemoglobin: 13.7 g/dL (ref 13.0–17.0)
MCH: 29.3 pg (ref 26.0–34.0)
MCHC: 32.7 g/dL (ref 30.0–36.0)
MCV: 89.5 fL (ref 80.0–100.0)
Platelets: 99 10*3/uL — ABNORMAL LOW (ref 150–400)
RBC: 4.68 MIL/uL (ref 4.22–5.81)
RDW: 14.5 % (ref 11.5–15.5)
WBC: 7.5 10*3/uL (ref 4.0–10.5)
nRBC: 0 % (ref 0.0–0.2)

## 2022-07-26 LAB — BASIC METABOLIC PANEL
Anion gap: 11 (ref 5–15)
BUN: 41 mg/dL — ABNORMAL HIGH (ref 8–23)
CO2: 29 mmol/L (ref 22–32)
Calcium: 9.3 mg/dL (ref 8.9–10.3)
Chloride: 100 mmol/L (ref 98–111)
Creatinine, Ser: 2.65 mg/dL — ABNORMAL HIGH (ref 0.61–1.24)
GFR, Estimated: 22 mL/min — ABNORMAL LOW (ref >60)
Glucose, Bld: 96 mg/dL (ref 70–99)
Potassium: 3.9 mmol/L (ref 3.5–5.1)
Sodium: 140 mmol/L (ref 135–145)

## 2022-07-26 LAB — LACTIC ACID, PLASMA: Lactic Acid, Venous: 1.2 mmol/L (ref 0.5–1.9)

## 2022-07-26 LAB — URIC ACID: Uric Acid, Serum: 11 mg/dL — ABNORMAL HIGH (ref 3.7–8.6)

## 2022-07-26 MED ORDER — FENTANYL CITRATE PF 50 MCG/ML IJ SOSY
50.0000 ug | PREFILLED_SYRINGE | Freq: Once | INTRAMUSCULAR | Status: AC
  Administered 2022-07-26: 50 ug via INTRAVENOUS
  Filled 2022-07-26: qty 1

## 2022-07-26 MED ORDER — LACTATED RINGERS IV SOLN: INTRAVENOUS | Status: DC

## 2022-07-26 MED ORDER — ACETAMINOPHEN 325 MG PO TABS
650.0000 mg | ORAL_TABLET | Freq: Once | ORAL | Status: AC
  Administered 2022-07-26: 650 mg via ORAL

## 2022-07-26 NOTE — ED Provider Notes (Signed)
[place] EMERGENCY DEPARTMENT AT [facility] Provider Note   CSN: [id number] Arrival date & time: 07/26/22  1616     History  Chief Complaint  Patient presents with   Finger Swelling    [name] is a 89 y.o. male.  The history is provided by the patient and medical records. No language interpreter was used.  Hand Pain This is a new problem. The current episode started more than 2 days ago. The problem occurs constantly. The problem has been gradually worsening. Pertinent negatives include no chest pain, no abdominal pain, no headaches and no shortness of breath. The symptoms are aggravated by bending. He has tried nothing for the symptoms. The treatment provided no relief.       Home Medications Prior to Admission medications   Medication Sig Start Date End Date Taking? Authorizing Provider  apixaban (ELIQUIS) 2.5 MG TABS tablet Take 1 tablet (2.5 mg total) by mouth 2 (two) times daily. 02/20/21   Jodelle Red, MD  atorvastatin (LIPITOR) 20 MG tablet Take by mouth. Every Monday, Wednesday and Friday 05/17/21   [provider]  hydrocortisone cream 1 % 1 application to affected area as needed for hemorrhoids    [provider]  levothyroxine (SYNTHROID) 75 MCG tablet Take 75 mcg by mouth daily before breakfast.     [provider]  lidocaine 4 % Cut patch in half and apply to painful areas 1 hour before bed 06/08/22   Cheron Schaumann K, PA-C  metoprolol succinate (TOPROL-XL) 50 MG 24 hr tablet TAKE ONE TABLET BY MOUTH ONE TIME DAILY 11/10/19   Jodelle Red, MD  Polyethyl Glycol-Propyl Glycol (SYSTANE OP) Place 1 drop into both eyes 3 (three) times daily as needed (dry/irritated eyes.).     [provider]  torsemide (DEMADEX) 20 MG tablet Take 20-40 mg by mouth See admin instructions. Take 2 tablets (40 mg) by mouth in the morning & take 1 tablet (20 mg) by mouth at night.    [provider]  vitamin B-12  (CYANOCOBALAMIN) 1000 MCG tablet Take 1,000 mcg by mouth daily.     [provider]      Allergies    Asa [aspirin], Flomax [tamsulosin], Lipitor [atorvastatin], Mirtazapine, Pirfenidone, and Tramadol    Review of Systems   Review of Systems  Constitutional:  Negative for chills, fatigue and fever.  HENT:  Negative for congestion.   Respiratory:  Negative for cough, chest tightness and shortness of breath.   Cardiovascular:  Negative for chest pain.  Gastrointestinal:  Negative for abdominal pain, constipation, diarrhea, nausea and vomiting.  Genitourinary:  Negative for flank pain and frequency.  Musculoskeletal:  Negative for back pain, neck pain and neck stiffness.  Skin:  Positive for color change.  Neurological:  Negative for weakness, light-headedness, numbness and headaches.  Psychiatric/Behavioral:  Negative for agitation.   All other systems reviewed and are negative.   Physical Exam Updated Vital Signs BP (!) 151/77 (BP Location: Right Arm)   Pulse 67   Temp 98.1 F (36.7 C) (Oral)   Resp 15   Ht 5\' 8"  (1.727 m)   Wt 74.8 kg   SpO2 99%   BMI 25.09 kg/m  Physical Exam Vitals and nursing note reviewed.  Constitutional:      General: He is not in acute distress.    Appearance: He is well-developed. He is not ill-appearing, toxic-appearing or diaphoretic.  HENT:     Head: Normocephalic and atraumatic.  Nose: Nose normal.  Eyes:     Conjunctiva/sclera: Conjunctivae normal.  Cardiovascular:     Rate and Rhythm: Normal rate and regular rhythm.     Heart sounds: No murmur heard. Pulmonary:     Effort: Pulmonary effort is normal. No respiratory distress.     Breath sounds: Normal breath sounds.  Abdominal:     Palpations: Abdomen is soft.     Tenderness: There is no abdominal tenderness. There is no guarding or rebound.  Musculoskeletal:        General: Swelling and tenderness present.     Cervical back: Neck supple.     Right lower leg: No edema.      Left lower leg: No edema.  Skin:    General: Skin is warm and dry.     Capillary Refill: Capillary refill takes less than 2 seconds.     Findings: Erythema present.  Neurological:     General: No focal deficit present.     Mental Status: He is alert.     Sensory: No sensory deficit.     Motor: No weakness.  Psychiatric:        Mood and Affect: Mood normal.            ED Results / Procedures / Treatments   Labs (all labs ordered are listed, but only abnormal results are displayed) Labs Reviewed  BASIC METABOLIC PANEL - Abnormal; Notable for the following components:      Result Value   BUN 41 (*)    Creatinine, Ser 2.65 (*)    GFR, Estimated 22 (*)    All other components within normal limits  CBC - Abnormal; Notable for the following components:   Platelets 99 (*)    All other components within normal limits  CULTURE, BLOOD (ROUTINE X 2)  CULTURE, BLOOD (ROUTINE X 2)  LACTIC ACID, PLASMA  LACTIC ACID, PLASMA    EKG None  Radiology DG Finger Ring Left  Result Date: 07/26/2022 CLINICAL DATA:  Pain, redness, swelling, and streaking at LEFT ring finger for 2 days, no injury EXAM: LEFT RING FINGER 2+V COMPARISON:  None Available. FINDINGS: Osseous demineralization diffusely. Joint spaces preserved. Soft tissue swelling LEFT ring finger, greatest at PIP joint. No acute fracture, dislocation, or bone destruction. No radiopaque foreign bodies or soft tissue gas. IMPRESSION: Soft tissue swelling LEFT ring finger without osseous abnormalities. Electronically Signed   By: Ulyses Southward M.D.   On: 07/26/2022 15:33    Procedures Procedures    Medications Ordered in ED Medications  fentaNYL (SUBLIMAZE) injection 50 mcg (50 mcg Intravenous Given 07/26/22 2131)    ED Course/ Medical Decision Making/ A&P                             Medical Decision Making Amount and/or Complexity of Data Reviewed Labs: ordered.  Risk Prescription drug management.    [name] is a 89 y.o. male with a past medical history significant for hypertension, CAD, previous CABG, previous TAVR, mitral valve repair, atrial fibrillation on Eliquis use, TIA, CKD, hyperlipidemia, and diverticulosis who presents from an urgent care for further evaluation of left hand and finger pain.  According to patient, for the last 3 days he has developed pain redness and swelling in his left ring finger.  He is right-handed.  He reports there was no injury or skin rash or changes but he does report about 50 years ago he  had skin grafting and surgery right near the area after he had a injury related to wearing his wedding ring.  He reports that he has never had any other issues with it.  He reports that he went to an urgent care where they did an x-ray that showed no acute bony abnormality but showed soft tissue swelling and then sent him here.  He was told that he likely has infection in his joint and he needs further evaluation.  He reports the pain is 10 out of 10 in severity and he reports the redness has spread up his hand in the last 12 hours or so.  He reports no fevers, chills, nausea, vomiting.  Denies any trauma.  He reports he cannot bend his finger whatsoever and it hurts to attempt any bending.    On exam, he is holding it in a slightly flexed position and it is red and swollen.  Redness is going up his hand towards his wrist.  Wrist is nontender has no snuffbox tenderness.  He has intact capillary refill and sensation but cannot bend it with any movement.  Is very tender to touch.  Rest of exam unremarkable with clear breath sounds and nontender chest or abdomen.  X-ray was obtained at the urgent care that showed no acute bony abnormality.  Clinically I am concerned about septic arthritis.  I called and spoke with hand surgery with Dr. Merlyn Lot who requested patient be transferred ED to ED so that he can go to the operating room tonight to drain it.  Patient will need admission by medicine  after.  Will call hospitalist team for admission.  Hand surgery requested to hold on antibiotics given his reassuring labs and vitals at this time.  He will go ED to ED to go to the OR and then be admitted.   Spoke to Dr. Jacqulyn Bath in the emergency department at [facility] who accept the patient for ED to ED transfer.  Spoke to Dr. Julian Reil with hospitalist who will admit for further management after the OR.  Patient will go ED to ED via personal vehicle with family.  Patient transferred in stable condition for further management of finger infection.          Final Clinical Impression(s) / ED Diagnoses Final diagnoses:  Finger infection  Pain of finger of left hand     Clinical Impression: 1. Finger infection   2. Pain of finger of left hand     Disposition: Admit  This note was prepared with assistance of Dragon voice recognition software. Occasional wrong-word or sound-a-like substitutions may have occurred due to the inherent limitations of voice recognition software.      Deeksha Cotrell, Canary Brim, MD 07/26/22 2204

## 2022-07-26 NOTE — ED Provider Notes (Signed)
[facility]    CSN: [id number] Arrival date & time: 07/26/22  1337      History   Chief Complaint Chief Complaint  Patient presents with   Hand Pain    HPI [name] is a 89 y.o. male presents for evaluation of finger pain and swelling.  Patient is accompanied by his wife.  Patient reports 2 days of worsening swelling, redness, warmth, and pain that began at his left fourth PIP joint that is now extended to his mid hand.  He states just in the time he has been in this clinic the redness has extended from his MCP joint to his mid hand.  He states he is unable to move the hand.  Denies any injury.  No fevers or chills.  Does have a history of gout in his foot but states this does not feel like gout to him.  No history of MRSA.  He does have a complex medical history including chronic kidney disease, CAD, CHF, arrhythmia on anticoagulation.  He has not taken any OTC medications for his symptoms today.  No other concerns at this time.   Hand Pain    Past Medical History:  Diagnosis Date   Adenomatous colon polyp    CAD (coronary artery disease)    a. S/P CABG x 1 @ time of MV annuloplasty;  b. 02/2010 ETT: neg for ischemia.   Carotid artery stenosis    1-39% by dopplers 10/2016   Chronic combined systolic (congestive) and diastolic (congestive) heart failure (HCC)    CKD (chronic kidney disease), stage III (HCC)    Current use of long term anticoagulation    a. Coumadin in setting of afib.   Diverticulosis    Dyslipidemia    Dysrhythmia    A-fib   Essential hypertension    H/O mitral valve repair 09/16/2002   Complex valvuloplasty including quadrangular resection of posterior leaflet with artificial Gore-tex neochords and 28 mm Seguin ring annuloplasty - Dr Tyrone Sage   Hemorrhoids    Hyperlipidemia    Hypertension    Permanent atrial fibrillation (HCC)    a. CHA2DS2VASc = 6 -->chronic coumadin;  b. Prev on amio-->d/c 2/2 hypothyroidism. >> resumed 5/16. c. Notes  from 2017 indicate interstitial lung disease by pulm appt, question amio toxicity, also increased liver attenuation. Rate control pursued and amiodarone discontinued.   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   Thrombocytopenia (HCC)    Sleep apnea 12/2018   ILD (interstitial lung disease) (HCC) 05/25/2016   External hemorrhoids 08/02/2015   Severe aortic stenosis 07/08/2015   Carotid artery stenosis 11/22/2014   Chronic anticoagulation 04/13/2014   Chronic kidney disease (CKD), stage III (moderate) (HCC) 03/02/2014   Coronary artery disease involving native coronary artery of native heart without angina pectoris 03/02/2014   Permanent atrial fibrillation 02/07/2014   Acute on chronic  combined systolic and diastolic CHF (congestive heart failure) (HCC) 02/07/2014   Dyslipidemia 02/07/2014   Generalized weakness 02/07/2014   H/O mitral valve repair 09/16/2002   S/P CABG x 1 09/16/2002    Past Surgical History:  Procedure Laterality Date   CATARACT EXTRACTION W/ INTRAOCULAR LENS  IMPLANT, BILATERAL     per patient about 4 years ago   CORONARY ARTERY BYPASS GRAFT  2004   ELECTROMAGNETIC NAVIGATION BROCHOSCOPY  10/14/2019   Procedure: NAVIGATION BRONCHOSCOPY;  Surgeon: Josephine Igo, DO;  Location: [facility] ENDOSCOPY;  Service: Pulmonary;;   INTERCOSTAL NERVE BLOCK Right 10/14/2019   Procedure: INTERCOSTAL NERVE BLOCK;  Surgeon:  Delight Ovens, MD;  Location: [facility] OR;  Service: Thoracic;  Laterality: Right;   INTRAOPERATIVE TRANSTHORACIC ECHOCARDIOGRAM N/A 05/26/2019   Procedure: INTRAOPERATIVE TRANSTHORACIC ECHOCARDIOGRAM;  Surgeon: Tonny Bollman, MD;  Location: [facility] OR;  Service: Open Heart Surgery;  Laterality: N/A;   MITRAL VALVE REPAIR     RIGHT/LEFT HEART CATH AND CORONARY/GRAFT ANGIOGRAPHY N/A 04/28/2019   Procedure: RIGHT/LEFT HEART CATH AND CORONARY/GRAFT ANGIOGRAPHY;  Surgeon: Tonny Bollman, MD;  Location: [facility] INVASIVE CV LAB;  Service: Cardiovascular;  Laterality: N/A;   SUBMUCOSAL TATTOO INJECTION  10/14/2019   Procedure: ENDOBRONCHIAL DYE INJECTION;  Surgeon: Josephine Igo, DO;  Location: [facility] ENDOSCOPY;  Service: Pulmonary;;   TEE WITHOUT CARDIOVERSION N/A 05/20/2019   Procedure: TRANSESOPHAGEAL ECHOCARDIOGRAM (TEE);  Surgeon: Little Ishikawa, MD;  Location: [facility] ENDOSCOPY;  Service: Cardiovascular;  Laterality: N/A;   TRANSCATHETER AORTIC VALVE REPLACEMENT, TRANSFEMORAL N/A 05/26/2019   Procedure: TRANSCATHETER AORTIC VALVE REPLACEMENT, TRANSFEMORAL;  Surgeon: Tonny Bollman, MD;  Location: [facility] OR;  Service: Open Heart Surgery;  Laterality: N/A;   VIDEO BRONCHOSCOPY WITH ENDOBRONCHIAL NAVIGATION N/A 10/14/2019   Procedure: VIDEO BRONCHOSCOPY;  Surgeon: Josephine Igo, DO;  Location: [facility] ENDOSCOPY;  Service: Pulmonary;  Laterality: N/A;       Home Medications    Prior to Admission medications   Medication Sig Start Date End Date Taking? Authorizing Provider  apixaban (ELIQUIS) 2.5 MG TABS tablet Take 1 tablet (2.5 mg total) by mouth 2 (two) times daily. 02/20/21   Jodelle Red, MD  atorvastatin (LIPITOR) 20 MG tablet Take by mouth. Every Monday, Wednesday and Friday 05/17/21   [provider]  hydrocortisone cream 1 % 1 application to affected area as needed for hemorrhoids    [provider]  levothyroxine (SYNTHROID) 75 MCG tablet Take 75 mcg by mouth daily before  breakfast.     [provider]  lidocaine 4 % Cut patch in half and apply to painful areas 1 hour before bed 06/08/22   Cheron Schaumann K, PA-C  metoprolol succinate (TOPROL-XL) 50 MG 24 hr tablet TAKE ONE TABLET BY MOUTH ONE TIME DAILY 11/10/19   Jodelle Red, MD  Polyethyl Glycol-Propyl Glycol (SYSTANE OP) Place 1 drop into both eyes 3 (three) times daily as needed (dry/irritated eyes.).     [provider]  torsemide (DEMADEX) 20 MG tablet Take 20-40 mg by mouth See admin instructions. Take 2 tablets (40 mg) by mouth in the morning & take 1 tablet (20 mg) by mouth at night.    [provider]  vitamin B-12 (CYANOCOBALAMIN) 1000 MCG tablet Take 1,000 mcg by mouth daily.     [provider]    Family History Family History  Problem Relation Age of Onset   Stroke Mother        Deceased   Heart disease Father  Deceased   Heart failure Brother        Deceased   Stroke Brother     Social History Social History   Tobacco Use   Smoking status: Former    Packs/day: 0.10    Years: 2.00    Additional pack years: 0.00    Total pack years: 0.20    Types: Cigarettes    Quit date: 03/20/1955    Years since quitting: 67.3   Smokeless tobacco: Never  Vaping Use   Vaping Use: Never used  Substance Use Topics   Alcohol use: No    Alcohol/week: 0.0 standard drinks of alcohol   Drug use: No     Allergies   Asa [aspirin], Flomax [tamsulosin], Lipitor [atorvastatin], Mirtazapine, Pirfenidone, and Tramadol   Review of Systems Review of Systems  Musculoskeletal:        Left fourth finger pain and swelling     Physical Exam Triage Vital Signs ED Triage Vitals  Enc Vitals Group     BP 07/26/22 1419 135/65     Pulse Rate 07/26/22 1419 70     Resp 07/26/22 1419 18     Temp 07/26/22 1419 97.8 F (36.6 C)     Temp Source 07/26/22 1419 Oral     SpO2 07/26/22 1419 96 %     Weight --      Height --      Head Circumference --      Peak  Flow --      Pain Score 07/26/22 1418 8     Pain Loc --      Pain Edu? --      Excl. in GC? --    No data found.  Updated Vital Signs BP 135/65 (BP Location: Right Arm)   Pulse 70   Temp 97.8 F (36.6 C) (Oral)   Resp 18   SpO2 96%   Visual Acuity Right Eye Distance:   Left Eye Distance:   Bilateral Distance:    Right Eye Near:   Left Eye Near:    Bilateral Near:     Physical Exam Vitals and nursing note reviewed.  Constitutional:      General: He is not in acute distress.    Appearance: Normal appearance. He is not ill-appearing.  HENT:     Head: Normocephalic and atraumatic.  Eyes:     Pupils: Pupils are equal, round, and reactive to light.  Cardiovascular:     Rate and Rhythm: Normal rate.  Pulmonary:     Effort: Pulmonary effort is normal.  Musculoskeletal:       Hands:     Comments: Significant swelling with redness and erythema of the left fourth PIP joint that is exquisitely tender to touch.  Redness does extend past the MCP joint to the mid fourth metacarpal.  Patient unable to flex or extend finger.  Skin:    General: Skin is warm and dry.  Neurological:     Mental Status: He is alert and oriented to person, place, and time.  Psychiatric:        Mood and Affect: Mood normal.        Behavior: Behavior normal.      UC Treatments / Results  Labs (all labs ordered are listed, but only abnormal results are displayed) Labs Reviewed - No data to display  EKG   Radiology DG Finger Ring Left  Result Date: 07/26/2022 CLINICAL DATA:  Pain, redness, swelling, and streaking at LEFT ring finger for 2  days, no injury EXAM: LEFT RING FINGER 2+V COMPARISON:  None Available. FINDINGS: Osseous demineralization diffusely. Joint spaces preserved. Soft tissue swelling LEFT ring finger, greatest at PIP joint. No acute fracture, dislocation, or bone destruction. No radiopaque foreign bodies or soft tissue gas. IMPRESSION: Soft tissue swelling LEFT ring finger without  osseous abnormalities. Electronically Signed   By: Ulyses Southward M.D.   On: 07/26/2022 15:33    Procedures Procedures (including critical care time)  Medications Ordered in UC Medications  acetaminophen (TYLENOL) tablet 650 mg (650 mg Oral Given 07/26/22 1505)    Initial Impression / Assessment and Plan / UC Course  I have reviewed the triage vital signs and the nursing notes.  Pertinent labs & imaging results that were available during my care of the patient were reviewed by me and considered in my medical decision making (see chart for details).     Reviewed exam and symptoms with patient.  My concern is the quick progression of his symptoms over the past 2 days.  He again states that since he has been in the clinic the redness and warmth has extended from his MCP joint to his mid hand.  Concern for septic joint.  I advised to go to the emergency room for further evaluation and treatment.  He and his wife are in agreement with plan will go POV to the emergency room. Final Clinical Impressions(s) / UC Diagnoses   Final diagnoses:  Pain in finger of left hand     Discharge Instructions      Please go to the emergency room for further evaluation of your symptoms     ED Prescriptions   None    PDMP not reviewed this encounter.   Radford Pax, NP 07/26/22 1537

## 2022-07-26 NOTE — ED Triage Notes (Signed)
Pt presents to UC w/ c/o left hand ring finger x2 days. Worse every day. Pt states he woke up one morning and finger was swollen. Hx gout

## 2022-07-26 NOTE — ED Provider Notes (Signed)
Blood pressure (!) 180/81, pulse 67, temperature 98.4 F (36.9 C), resp. rate 13, height 5\' 8"  (1.727 m), weight 74.8 kg, SpO2 96 %.  In short, [name] is a 89 y.o. male with a chief complaint of Finger Swelling .  Refer to the original H&P for additional details.  10:47 PM  Patient arrived [facility].  I have reordered pain medicines and will start maintenance fluids.  Will page hand surgery to make them aware that the patient has arrived.  Coordinated with Dr. Julian Reil.  [facility] hospitalist service will admit after surgery.   Spoke with Dr. Merlyn Lot. Uric acid elevated. He will be down to evaluate to decide if he will take patient urgently to the OR tonight vs admit to [facility] for steroid and reassess in the AM for improvement. Uric acid is elevated. Will be down to see the patient.     Maia Plan, MD 07/26/22 2325

## 2022-07-26 NOTE — Progress Notes (Signed)
Plan of Care Note for accepted transfer   Patient: Riad Slemmer MRN: [id number]   DOA: 07/26/2022  Facility requesting transfer: [facility] Requesting Provider: Dr. Julieanne Manson Reason for transfer: Septic arthritis of hand Facility course: 89 yo M.  History of hand surgery / reconstruction some 50 years ago due to trauma.  No recent trauma nor injury however.  Pt in to ED with pain, swelling erythema of ring finger:   No osteomyelitis on plain film X ray.  Concern though for septic arthritis.  Plan of care: Pt already going ED to ED transfer to [facility], Dr. Jacqulyn Bath accepted.  Plan is for Dr. Merlyn Lot to see pt and likely take urgently to OR tonight for I+D and micro cultures.  Dr. Merlyn Lot wants [place] to hold off on Abx pre-op since pt stable so they can get cultures intra-op.  Will need ABx and hospitalist admission post-op.   Author: Hillary Bow., DO 07/26/2022  Check [email or web address] for on-call coverage.  Nursing staff, Please call [facility] Admits & Consults [facility]-Wide number on Amion as soon as patient's arrival, so appropriate admitting provider can evaluate the pt.

## 2022-07-26 NOTE — ED Triage Notes (Signed)
Patient here POV from Home.  Endorses Left Fourth Digit Swelling and Redness that began 2 Days ago.   Seen at [facility] today and XR Imaging was performed which was unremarkable. History of Gout. No Known Fevers. No Known Trauma.  NAD Noted during Triage. A&Ox4. GCS 15. Ambulatory.

## 2022-07-26 NOTE — ED Notes (Signed)
Pt arrives from [facility] via [facility] for OR and admission. [facility] is aware of pts arrival. Hand surg consulted by edp

## 2022-07-26 NOTE — Discharge Instructions (Addendum)
Please go to the emergency room for further evaluation of your symptoms 

## 2022-07-26 NOTE — Consult Note (Signed)
[name] is an 89 y.o. male.   Chief Complaint: Left ring finger swelling HPI: 89 year old male present with his wife.  He states over the past 2 to 3 days he has had increasing pain swelling and erythema of the left ring finger.  He does not remember specific injuries.  He has had no wounds.  He does not feel sick.  No fevers or chills.  Pain is localized to the PIP joint of the ring finger.  He has had previous injury to the finger from a ring many years ago but does not currently wear ring.  Allergies:  Allergies  Allergen Reactions   Asa [Aspirin] Other (See Comments)    Bleeding- has internal hemorrhoids   Flomax [Tamsulosin] Palpitations and Other (See Comments)    Made his heart go out of rhythm   Lipitor [Atorvastatin]     "loss of muscle control"   Mirtazapine     "Tremor"   Pirfenidone     Weight loss, loss of appetite.   Tramadol     Nausea/vomiting    Past Medical History:  Diagnosis Date   Adenomatous colon polyp    CAD (coronary artery disease)    a. S/P CABG x 1 @ time of MV annuloplasty;  b. 02/2010 ETT: neg for ischemia.   Carotid artery stenosis    1-39% by dopplers 10/2016   Chronic combined systolic (congestive) and diastolic (congestive) heart failure (HCC)    CKD (chronic kidney disease), stage III (HCC)    Current use of long term anticoagulation    a. Coumadin in setting of afib.   Diverticulosis    Dyslipidemia    Dysrhythmia    A-fib   Essential hypertension    H/O mitral valve repair 09/16/2002   Complex valvuloplasty including quadrangular resection of posterior leaflet with artificial Gore-tex neochords and 28 mm Seguin ring annuloplasty - Dr Tyrone Sage   Hemorrhoids    Hyperlipidemia    Hypertension    Permanent atrial fibrillation (HCC)    a. CHA2DS2VASc = 6 -->chronic coumadin;  b. Prev on amio-->d/c 2/2 hypothyroidism. >> resumed 5/16. c. Notes from 2017 indicate interstitial lung disease by pulm appt, question amio toxicity, also  increased liver attenuation. Rate control pursued and amiodarone discontinued.   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   s/p TAVR w/ a 29 mm Edwards Sapien 3 THV via the TF approach by Drs Excell Seltzer and Cornelius Moras   Sleep apnea 12/2018   per patient's spouse wears BIPAP "every other night or so"   Thrombocytopenia (HCC)    TIA (transient ischemic attack)     Past Surgical History:  Procedure Laterality Date   CATARACT EXTRACTION W/ INTRAOCULAR LENS  IMPLANT, BILATERAL     per patient about 4 years ago   CORONARY ARTERY BYPASS GRAFT  2004   ELECTROMAGNETIC NAVIGATION BROCHOSCOPY  10/14/2019   Procedure: NAVIGATION BRONCHOSCOPY;  Surgeon: Josephine Igo, DO;  Location: [facility] ENDOSCOPY;  Service: Pulmonary;;   INTERCOSTAL NERVE BLOCK Right 10/14/2019   Procedure: INTERCOSTAL NERVE BLOCK;  Surgeon: Delight Ovens, MD;  Location: [facility] OR;  Service: Thoracic;  Laterality: Right;   INTRAOPERATIVE TRANSTHORACIC ECHOCARDIOGRAM N/A 05/26/2019   Procedure: INTRAOPERATIVE TRANSTHORACIC ECHOCARDIOGRAM;  Surgeon: Tonny Bollman, MD;  Location: [facility] OR;  Service: Open Heart Surgery;  Laterality: N/A;   MITRAL VALVE REPAIR     RIGHT/LEFT HEART CATH AND CORONARY/GRAFT ANGIOGRAPHY N/A 04/28/2019  Procedure: RIGHT/LEFT HEART CATH AND CORONARY/GRAFT ANGIOGRAPHY;  Surgeon: Tonny Bollman, MD;  Location: [facility] INVASIVE CV LAB;  Service: Cardiovascular;  Laterality: N/A;   SUBMUCOSAL TATTOO INJECTION  10/14/2019   Procedure: ENDOBRONCHIAL DYE INJECTION;  Surgeon: Josephine Igo, DO;  Location: [facility] ENDOSCOPY;  Service: Pulmonary;;   TEE WITHOUT CARDIOVERSION N/A 05/20/2019   Procedure: TRANSESOPHAGEAL ECHOCARDIOGRAM (TEE);  Surgeon: Little Ishikawa, MD;  Location: [facility] ENDOSCOPY;  Service: Cardiovascular;  Laterality: N/A;   TRANSCATHETER AORTIC VALVE REPLACEMENT, TRANSFEMORAL N/A 05/26/2019   Procedure: TRANSCATHETER AORTIC VALVE REPLACEMENT, TRANSFEMORAL;   Surgeon: Tonny Bollman, MD;  Location: [facility] OR;  Service: Open Heart Surgery;  Laterality: N/A;   VIDEO BRONCHOSCOPY WITH ENDOBRONCHIAL NAVIGATION N/A 10/14/2019   Procedure: VIDEO BRONCHOSCOPY;  Surgeon: Josephine Igo, DO;  Location: [facility] ENDOSCOPY;  Service: Pulmonary;  Laterality: N/A;    Family History: Family History  Problem Relation Age of Onset   Stroke Mother        Deceased   Heart disease Father        Deceased   Heart failure Brother        Deceased   Stroke Brother     Social History:   reports that he quit smoking about 67 years ago. His smoking use included cigarettes. He has a 0.20 pack-year smoking history. He has never used smokeless tobacco. He reports that he does not drink alcohol and does not use drugs.  Medications: (Not in a hospital admission)   Results for orders placed or performed during the hospital encounter of 07/26/22 (from the past 48 hour(s))  Basic metabolic panel     Status: Abnormal   Collection Time: 07/26/22  4:44 PM  Result Value Ref Range   Sodium 140 135 - 145 mmol/L   Potassium 3.9 3.5 - 5.1 mmol/L   Chloride 100 98 - 111 mmol/L   CO2 29 22 - 32 mmol/L   Glucose, Bld 96 70 - 99 mg/dL    Comment: Glucose reference range applies only to samples taken after fasting for at least 8 hours.   BUN 41 (H) 8 - 23 mg/dL   Creatinine, Ser 4.09 (H) 0.61 - 1.24 mg/dL   Calcium 9.3 8.9 - 81.1 mg/dL   GFR, Estimated 22 (L) >60 mL/min    Comment: (NOTE) Calculated using the CKD-EPI Creatinine Equation (2021)    Anion gap 11 5 - 15    Comment: Performed at [facility], [address] [place], [place] [address]  CBC     Status: Abnormal   Collection Time: 07/26/22  4:44 PM  Result Value Ref Range   WBC 7.5 4.0 - 10.5 K/uL   RBC 4.68 4.22 - 5.81 MIL/uL   Hemoglobin 13.7 13.0 - 17.0 g/dL   HCT 29.5 62.1 - 30.8 %   MCV 89.5 80.0 - 100.0 fL   MCH 29.3 26.0 - 34.0 pg   MCHC 32.7 30.0 - 36.0 g/dL   RDW 65.7 84.6 - 96.2 %    Platelets 99 (L) 150 - 400 K/uL    Comment: SPECIMEN CHECKED FOR CLOTS REPEATED TO VERIFY PLATELET COUNT CONFIRMED BY SMEAR    nRBC 0.0 0.0 - 0.2 %    Comment: Performed at [facility], [address], [place], [place] [address]  Lactic acid, plasma     Status: None   Collection Time: 07/26/22  9:21 PM  Result Value Ref Range   Lactic Acid, Venous 1.2 0.5 - 1.9 mmol/L  Comment: Performed at [facility], [address] [place], [place] [address]  Uric acid     Status: Abnormal   Collection Time: 07/26/22 10:20 PM  Result Value Ref Range   Uric Acid, Serum 11.0 (H) 3.7 - 8.6 mg/dL    Comment: Performed at [facility], [address] [place], [place] [address]    DG Finger Ring Left  Result Date: 07/26/2022 CLINICAL DATA:  Pain, redness, swelling, and streaking at LEFT ring finger for 2 days, no injury EXAM: LEFT RING FINGER 2+V COMPARISON:  None Available. FINDINGS: Osseous demineralization diffusely. Joint spaces preserved. Soft tissue swelling LEFT ring finger, greatest at PIP joint. No acute fracture, dislocation, or bone destruction. No radiopaque foreign bodies or soft tissue gas. IMPRESSION: Soft tissue swelling LEFT ring finger without osseous abnormalities. Electronically Signed   By: Ulyses Southward M.D.   On: 07/26/2022 15:33      Blood pressure (!) 140/77, pulse 72, temperature 97.8 F (36.6 C), temperature source Oral, resp. rate 15, height 5\' 8"  (1.727 m), weight 74.8 kg, SpO2 100 %.  General appearance: alert, cooperative, and appears stated age Head: Normocephalic, without obvious abnormality, atraumatic Neck: supple, symmetrical, trachea midline Extremities: Intact sensation and capillary refill all digits.  +epl/fpl/io.  No wounds.  He is swollen at the ring finger PIP joint.  Pain with passive motion of the joint.  Nontender volarly except at the joint itself.  There is erythema of the joint.  Mild erythema  along the dorsum of the finger into the hand.  No proximal streaking. Pulses: 2+ and symmetric Skin: Skin color, texture, turgor normal. No rashes or lesions Neurologic: Grossly normal Incision/Wound: none  Assessment/Plan Left ring finger gout versus septic arthritis.  He has a history of gout.  His uric acid is 11.0.  I recommend admission overnight for antibiotics and medical treatment for gout.  If this improves then we will continue with gout treatment and possibly oral antibiotics.  If he has worsening then we will proceed with incision and drainage tomorrow.  N.p.o. after breakfast.  He agrees with the plan of care.  Betha Loa 07/26/2022, 11:36 PM

## 2022-07-27 ENCOUNTER — Encounter (HOSPITAL_COMMUNITY): Payer: Self-pay | Admitting: Certified Registered"

## 2022-07-27 ENCOUNTER — Encounter (HOSPITAL_COMMUNITY): Payer: Self-pay | Admitting: Internal Medicine

## 2022-07-27 ENCOUNTER — Encounter (HOSPITAL_COMMUNITY)
Admission: EM | Disposition: A | Discharge status: Home / Self Care | Payer: Self-pay | Source: Home / Self Care | Attending: Internal Medicine

## 2022-07-27 DIAGNOSIS — M7989 Other specified soft tissue disorders: Secondary | ICD-10-CM

## 2022-07-27 DIAGNOSIS — Z953 Presence of xenogenic heart valve: Secondary | ICD-10-CM | POA: Diagnosis not present

## 2022-07-27 DIAGNOSIS — D72829 Elevated white blood cell count, unspecified: Secondary | ICD-10-CM | POA: Diagnosis present

## 2022-07-27 DIAGNOSIS — Z951 Presence of aortocoronary bypass graft: Secondary | ICD-10-CM | POA: Diagnosis not present

## 2022-07-27 DIAGNOSIS — E039 Hypothyroidism, unspecified: Secondary | ICD-10-CM | POA: Diagnosis present

## 2022-07-27 DIAGNOSIS — I5042 Chronic combined systolic (congestive) and diastolic (congestive) heart failure: Secondary | ICD-10-CM | POA: Diagnosis present

## 2022-07-27 DIAGNOSIS — I130 Hypertensive heart and chronic kidney disease with heart failure and stage 1 through stage 4 chronic kidney disease, or unspecified chronic kidney disease: Secondary | ICD-10-CM | POA: Diagnosis present

## 2022-07-27 DIAGNOSIS — M109 Gout, unspecified: Secondary | ICD-10-CM | POA: Diagnosis present

## 2022-07-27 DIAGNOSIS — I4821 Permanent atrial fibrillation: Secondary | ICD-10-CM | POA: Diagnosis present

## 2022-07-27 DIAGNOSIS — N1832 Chronic kidney disease, stage 3b: Secondary | ICD-10-CM | POA: Diagnosis present

## 2022-07-27 DIAGNOSIS — D631 Anemia in chronic kidney disease: Secondary | ICD-10-CM | POA: Diagnosis present

## 2022-07-27 DIAGNOSIS — Z885 Allergy status to narcotic agent status: Secondary | ICD-10-CM | POA: Diagnosis not present

## 2022-07-27 DIAGNOSIS — Z886 Allergy status to analgesic agent status: Secondary | ICD-10-CM | POA: Diagnosis not present

## 2022-07-27 DIAGNOSIS — Z8673 Personal history of transient ischemic attack (TIA), and cerebral infarction without residual deficits: Secondary | ICD-10-CM | POA: Diagnosis not present

## 2022-07-27 DIAGNOSIS — Z87891 Personal history of nicotine dependence: Secondary | ICD-10-CM | POA: Diagnosis not present

## 2022-07-27 DIAGNOSIS — I2510 Atherosclerotic heart disease of native coronary artery without angina pectoris: Secondary | ICD-10-CM | POA: Diagnosis present

## 2022-07-27 DIAGNOSIS — D696 Thrombocytopenia, unspecified: Secondary | ICD-10-CM | POA: Diagnosis present

## 2022-07-27 DIAGNOSIS — D649 Anemia, unspecified: Secondary | ICD-10-CM | POA: Insufficient documentation

## 2022-07-27 DIAGNOSIS — Z79890 Hormone replacement therapy (postmenopausal): Secondary | ICD-10-CM | POA: Diagnosis not present

## 2022-07-27 DIAGNOSIS — Z7901 Long term (current) use of anticoagulants: Secondary | ICD-10-CM | POA: Diagnosis not present

## 2022-07-27 DIAGNOSIS — Z888 Allergy status to other drugs, medicaments and biological substances status: Secondary | ICD-10-CM | POA: Diagnosis not present

## 2022-07-27 DIAGNOSIS — Z8249 Family history of ischemic heart disease and other diseases of the circulatory system: Secondary | ICD-10-CM | POA: Diagnosis not present

## 2022-07-27 DIAGNOSIS — E785 Hyperlipidemia, unspecified: Secondary | ICD-10-CM | POA: Diagnosis present

## 2022-07-27 DIAGNOSIS — Z79899 Other long term (current) drug therapy: Secondary | ICD-10-CM | POA: Diagnosis not present

## 2022-07-27 DIAGNOSIS — J849 Interstitial pulmonary disease, unspecified: Secondary | ICD-10-CM | POA: Diagnosis present

## 2022-07-27 DIAGNOSIS — Z823 Family history of stroke: Secondary | ICD-10-CM | POA: Diagnosis not present

## 2022-07-27 LAB — CBC
HCT: 41.9 % (ref 39.0–52.0)
Hemoglobin: 13.8 g/dL (ref 13.0–17.0)
MCH: 29.4 pg (ref 26.0–34.0)
MCHC: 32.9 g/dL (ref 30.0–36.0)
MCV: 89.1 fL (ref 80.0–100.0)
Platelets: 86 10*3/uL — ABNORMAL LOW (ref 150–400)
RBC: 4.7 MIL/uL (ref 4.22–5.81)
RDW: 14.1 % (ref 11.5–15.5)
WBC: 8.4 10*3/uL (ref 4.0–10.5)
nRBC: 0 % (ref 0.0–0.2)

## 2022-07-27 LAB — BASIC METABOLIC PANEL
Anion gap: 9 (ref 5–15)
BUN: 33 mg/dL — ABNORMAL HIGH (ref 8–23)
CO2: 23 mmol/L (ref 22–32)
Calcium: 8.6 mg/dL — ABNORMAL LOW (ref 8.9–10.3)
Chloride: 105 mmol/L (ref 98–111)
Creatinine, Ser: 2.27 mg/dL — ABNORMAL HIGH (ref 0.61–1.24)
GFR, Estimated: 27 mL/min — ABNORMAL LOW (ref >60)
Glucose, Bld: 115 mg/dL — ABNORMAL HIGH (ref 70–99)
Potassium: 3.5 mmol/L (ref 3.5–5.1)
Sodium: 137 mmol/L (ref 135–145)

## 2022-07-27 LAB — LACTIC ACID, PLASMA: Lactic Acid, Venous: 1 mmol/L (ref 0.5–1.9)

## 2022-07-27 SURGERY — IRRIGATION AND DEBRIDEMENT EXTREMITY
Anesthesia: General | Laterality: Left

## 2022-07-27 MED ORDER — APIXABAN 2.5 MG PO TABS
2.5000 mg | ORAL_TABLET | Freq: Two times a day (BID) | ORAL | Status: DC
  Administered 2022-07-27 – 2022-07-28 (×3): 2.5 mg via ORAL
  Filled 2022-07-27 (×3): qty 1

## 2022-07-27 MED ORDER — FENTANYL CITRATE PF 50 MCG/ML IJ SOSY
25.0000 ug | PREFILLED_SYRINGE | INTRAMUSCULAR | Status: DC | PRN
  Administered 2022-07-27: 25 ug via INTRAVENOUS
  Filled 2022-07-27: qty 1

## 2022-07-27 MED ORDER — METOPROLOL SUCCINATE ER 50 MG PO TB24
50.0000 mg | ORAL_TABLET | Freq: Every day | ORAL | Status: DC
  Administered 2022-07-27 – 2022-07-28 (×2): 50 mg via ORAL
  Filled 2022-07-27: qty 2
  Filled 2022-07-27: qty 1

## 2022-07-27 MED ORDER — PRAVASTATIN SODIUM 40 MG PO TABS
80.0000 mg | ORAL_TABLET | ORAL | Status: DC
  Administered 2022-07-27: 80 mg via ORAL
  Filled 2022-07-27: qty 2

## 2022-07-27 MED ORDER — METHYLPREDNISOLONE SODIUM SUCC 125 MG IJ SOLR
80.0000 mg | Freq: Every day | INTRAMUSCULAR | Status: DC
  Administered 2022-07-27 – 2022-07-28 (×2): 80 mg via INTRAVENOUS
  Filled 2022-07-27 (×2): qty 2

## 2022-07-27 MED ORDER — VITAMIN B-12 1000 MCG PO TABS
1000.0000 ug | ORAL_TABLET | Freq: Every day | ORAL | Status: DC
  Administered 2022-07-27 – 2022-07-28 (×2): 1000 ug via ORAL
  Filled 2022-07-27 (×2): qty 1

## 2022-07-27 MED ORDER — SODIUM CHLORIDE 0.9 % IV SOLN
2.0000 g | INTRAVENOUS | Status: DC
  Administered 2022-07-27: 2 g via INTRAVENOUS
  Filled 2022-07-27: qty 12.5

## 2022-07-27 MED ORDER — VANCOMYCIN HCL 1500 MG/300ML IV SOLN
1500.0000 mg | Freq: Once | INTRAVENOUS | Status: AC
  Administered 2022-07-27: 1500 mg via INTRAVENOUS
  Filled 2022-07-27: qty 300

## 2022-07-27 MED ORDER — LEVOTHYROXINE SODIUM 75 MCG PO TABS
75.0000 ug | ORAL_TABLET | Freq: Every day | ORAL | Status: DC
  Administered 2022-07-27 – 2022-07-28 (×2): 75 ug via ORAL
  Filled 2022-07-27 (×2): qty 1

## 2022-07-27 MED ORDER — VANCOMYCIN VARIABLE DOSE PER UNSTABLE RENAL FUNCTION (PHARMACIST DOSING): Status: DC

## 2022-07-27 NOTE — Progress Notes (Signed)
Subjective: States finger is feeling much better today.  Pain decreased.  Motion improved.  No fevers.   Objective: Vital signs in last 24 hours: Temp:  [97.5 F (36.4 C)-98.4 F (36.9 C)] 97.5 F (36.4 C) (05/10 1051) Pulse Rate:  [61-113] 102 (05/10 1051) Resp:  [13-18] 16 (05/10 1051) BP: (129-180)/(67-98) 159/96 (05/10 1051) SpO2:  [94 %-100 %] 99 % (05/10 1051) Weight:  [74.8 kg] 74.8 kg (05/09 1640)  Intake/Output from previous day: No intake/output data recorded. Intake/Output this shift: No intake/output data recorded.  Recent Labs    07/26/22 1644 07/27/22 0226  HGB 13.7 13.8   Recent Labs    07/26/22 1644 07/27/22 0226  WBC 7.5 8.4  RBC 4.68 4.70  HCT 41.9 41.9  PLT 99* 86*   Recent Labs    07/26/22 1644 07/27/22 0226  NA 140 137  K 3.9 3.5  CL 100 105  CO2 29 23  BUN 41* 33*  CREATININE 2.65* 2.27*  GLUCOSE 96 115*  CALCIUM 9.3 8.6*   No results for input(s): "LABPT", "INR" in the last 72 hours.  Intact sensation and capillary refill.  Swelling and erythema of pip joint decreased.  Motion improved.  Erythema on dorsum of hand resolved.    Assessment/Plan: Much improved.  Appears to have been a gout attack.  Okay for d/c from hand standpoint with gout medication +/- antibiotic.  Follow up in office next week.     Betha Loa 07/27/2022, 3:01 PM

## 2022-07-27 NOTE — Progress Notes (Signed)
Pharmacy Antibiotic Note  [name] is a 89 y.o. male admitted on 07/26/2022 with  septic arthritis .  Pharmacy has been consulted for vanco and cefepime dosing.  Plan: Give vanco 1500 mg iv x1 dose Vanco random 5/10 14:00 Cefepime  2 grams iv q24h  Height: 5\' 8"  (172.7 cm) Weight: 74.8 kg (165 lb) IBW/kg (Calculated) : 68.4  Temp (24hrs), Avg:98 F (36.7 C), Min:97.8 F (36.6 C), Max:98.4 F (36.9 C)  Recent Labs  Lab 07/26/22 1644 07/26/22 2121  WBC 7.5  --   CREATININE 2.65*  --   LATICACIDVEN  --  1.2    Estimated Creatinine Clearance: 18.6 mL/min (A) (by C-G formula based on SCr of 2.65 mg/dL (H)).    Allergies  Allergen Reactions   Asa [Aspirin] Other (See Comments)    Bleeding- has internal hemorrhoids   Flomax [Tamsulosin] Palpitations and Other (See Comments)    Made his heart go out of rhythm   Lipitor [Atorvastatin]     "loss of muscle control"   Mirtazapine     "Tremor"   Pirfenidone     Weight loss, loss of appetite.   Tramadol     Nausea/vomiting    Thank you for allowing pharmacy to be a part of this patient's care.  Greta Doom BS, PharmD, BCPS Clinical Pharmacist 07/27/2022 1:30 AM  Contact: [phone] after 3 PM  "Be curious, not judgmental..." -Debbora Dus

## 2022-07-27 NOTE — Evaluation (Signed)
Occupational Therapy Evaluation Patient Details Name: [name] MRN: [id number] DOB: 05-24-1931 Today's Date: 07/27/2022   History of Present Illness 89 y.o. male with history of CAD status post CABG, status post TAVR, status post mitral valve repair, chronic kidney disease, atrial fibrillation, chronic thrombocytopenia presented because of worsening pain and swelling involving the left ring finger specifically the proximal interphalangeal joint.  Gout flare.   Clinical Impression   Patient admitted for the diagnosis above.  PTA he lives at home with his spouse, and needed no assist with any aspect of ADL, iADL or mobility.  Patient continues to have redness and swelling around the PIP of his left ring finger, but patient and family endorse significant improvement over prior day, and currently he reports only soreness.  OT reviewed gentle ROM and elevation, and encouraged mobility while here in the acute setting.  No acute OT needs exist, no splinting needs identified (tip protector),  and no post acute OT anticipated.       Recommendations for follow up therapy are one component of a multi-disciplinary discharge planning process, led by the attending physician.  Recommendations may be updated based on patient status, additional functional criteria and insurance authorization.   Assistance Recommended at Discharge None  Patient can return home with the following      Functional Status Assessment  Patient has not had a recent decline in their functional status  Equipment Recommendations  None recommended by OT    Recommendations for Other Services       Precautions / Restrictions Precautions Precautions: None Restrictions Weight Bearing Restrictions: No      Mobility Bed Mobility Overal bed mobility: Independent                  Transfers Overall transfer level: Independent                        Balance Overall balance assessment: No apparent balance  deficits (not formally assessed)                                         ADL either performed or assessed with clinical judgement   ADL Overall ADL's : At baseline                                             Vision Baseline Vision/History: 0 No visual deficits       Perception     Praxis      Pertinent Vitals/Pain Pain Assessment Pain Assessment: Faces Faces Pain Scale: Hurts a little bit Pain Location: PIP for ring finger Pain Descriptors / Indicators: Sore Pain Intervention(s): Monitored during session     Hand Dominance Right   Extremity/Trunk Assessment Upper Extremity Assessment Upper Extremity Assessment: LUE deficits/detail LUE Deficits / Details: 1/2 range to PIP of ring finger, swollen and red, but significantly improved over prior day. LUE Coordination: decreased fine motor   Lower Extremity Assessment Lower Extremity Assessment: Overall WFL for tasks assessed   Cervical / Trunk Assessment Cervical / Trunk Assessment: Normal   Communication Communication Communication: No difficulties   Cognition Arousal/Alertness: Awake/alert Behavior During Therapy: WFL for tasks assessed/performed Overall Cognitive Status: Within Functional Limits for tasks assessed  General Comments   VSS on RA    Exercises Other Exercises Other Exercises: gentle fist pumps to tolerance.   Shoulder Instructions      Home Living Family/patient expects to be discharged to:: Private residence Living Arrangements: Spouse/significant other Available Help at Discharge: Family;Available 24 hours/day Type of Home: House Home Access: Stairs to enter [employer] of Steps: 3 Entrance Stairs-Rails: Left Home Layout: One level     Bathroom Shower/Tub: Tub/shower unit;Walk-in shower   Bathroom Toilet: Standard Bathroom Accessibility: Yes How Accessible: Accessible via  walker Home Equipment: None          Prior Functioning/Environment Prior Level of Function : Independent/Modified Independent;Driving                        OT Problem List: Pain      OT Treatment/Interventions:      OT Goals(Current goals can be found in the care plan section) Acute Rehab OT Goals Patient Stated Goal: Hoping to return home today OT Goal Formulation: With patient Time For Goal Achievement: 08/03/22 Potential to Achieve Goals: Good  OT Frequency:      Co-evaluation              AM-PAC OT "6 Clicks" Daily Activity     Outcome Measure Help from another person eating meals?: None Help from another person taking care of personal grooming?: None Help from another person toileting, which includes using toliet, bedpan, or urinal?: None Help from another person bathing (including washing, rinsing, drying)?: None Help from another person to put on and taking off regular upper body clothing?: None Help from another person to put on and taking off regular lower body clothing?: None 6 Click Score: 24   End of Session Equipment Utilized During Treatment: Gait belt Nurse Communication: Mobility status  Activity Tolerance: Patient tolerated treatment well Patient left: in bed;with call bell/phone within reach;with family/visitor present  OT Visit Diagnosis: Pain Pain - Right/Left: Left Pain - part of body: Hand                Time: 1610-9604 OT Time Calculation (min): 21 min Charges:  OT General Charges $OT Visit: 1 Visit OT Evaluation $OT Eval Moderate Complexity: 1 Mod  07/27/2022  RP, OTR/L  Acute Rehabilitation Services  Office:  [phone]   Suzanna Obey 07/27/2022, 4:32 PM

## 2022-07-27 NOTE — ED Notes (Signed)
ED TO INPATIENT HANDOFF REPORT  ED Nurse Name and Phone #: Victorino Dike [phone]  S Name/Age/Gender [name] 89 y.o. male Room/Bed: 009C/009C  Code Status   Code Status: Full Code  Home/SNF/Other Home Patient oriented to: self, place, time, and situation Is this baseline? Yes   Triage Complete: Triage complete  Chief Complaint Hand swelling [M79.89]  Triage Note Patient here POV from Home.  Endorses Left Fourth Digit Swelling and Redness that began 2 Days ago.   Seen at [facility] today and XR Imaging was performed which was unremarkable. History of Gout. No Known Fevers. No Known Trauma.  NAD Noted during Triage. A&Ox4. GCS 15. Ambulatory.    Allergies Allergies  Allergen Reactions   Asa [Aspirin] Other (See Comments)    Bleeding- has internal hemorrhoids   Flomax [Tamsulosin] Palpitations and Other (See Comments)    Made his heart go out of rhythm   Lipitor [Atorvastatin]     "loss of muscle control"   Mirtazapine     "Tremor"   Pirfenidone     Weight loss, loss of appetite.   Tramadol     Nausea/vomiting    Level of Care/Admitting Diagnosis ED Disposition     ED Disposition  Admit   Condition  --   Comment  Hospital Area: [facility] [[id number]]  Level of Care: Telemetry Medical [104]  May place patient in observation at [facility] or [facility] if equivalent level of care is available:: No  Covid Evaluation: Asymptomatic - no recent exposure (last 10 days) testing not required  Diagnosis: Hand swelling [454098]  Admitting Physician: Eduard Clos [phone]  Attending Physician: Eduard Clos Florian.Pax          B Medical/Surgery History Past Medical History:  Diagnosis Date   Adenomatous colon polyp    CAD (coronary artery disease)    a. S/P CABG x 1 @ time of MV annuloplasty;  b. 02/2010 ETT: neg for ischemia.   Carotid artery stenosis    1-39% by dopplers 10/2016   Chronic combined systolic (congestive) and diastolic  (congestive) heart failure (HCC)    CKD (chronic kidney disease), stage III (HCC)    Current use of long term anticoagulation    a. Coumadin in setting of afib.   Diverticulosis    Dyslipidemia    Dysrhythmia    A-fib   Essential hypertension    H/O mitral valve repair 09/16/2002   Complex valvuloplasty including quadrangular resection of posterior leaflet with artificial Gore-tex neochords and 28 mm Seguin ring annuloplasty - Dr Tyrone Sage   Hemorrhoids    Hyperlipidemia    Hypertension    Permanent atrial fibrillation (HCC)    a. CHA2DS2VASc = 6 -->chronic coumadin;  b. Prev on amio-->d/c 2/2 hypothyroidism. >> resumed 5/16. c. Notes from 2017 indicate interstitial lung disease by pulm appt, question amio toxicity, also increased liver attenuation. Rate control pursued and amiodarone discontinued.   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   s/p TAVR w/ a 29 mm Edwards Sapien 3 THV via the TF approach by Drs Excell Seltzer and Cornelius Moras   Sleep apnea 12/2018   per patient's spouse wears BIPAP "every other night or so"   Thrombocytopenia (HCC)    TIA (transient ischemic attack)    Past Surgical History:  Procedure Laterality Date   CATARACT EXTRACTION W/ INTRAOCULAR LENS  IMPLANT, BILATERAL     per patient about 4  years ago   CORONARY ARTERY BYPASS GRAFT  2004   ELECTROMAGNETIC NAVIGATION BROCHOSCOPY  10/14/2019   Procedure: NAVIGATION BRONCHOSCOPY;  Surgeon: Josephine Igo, DO;  Location: [facility] ENDOSCOPY;  Service: Pulmonary;;   INTERCOSTAL NERVE BLOCK Right 10/14/2019   Procedure: INTERCOSTAL NERVE BLOCK;  Surgeon: Delight Ovens, MD;  Location: [facility] OR;  Service: Thoracic;  Laterality: Right;   INTRAOPERATIVE TRANSTHORACIC ECHOCARDIOGRAM N/A 05/26/2019   Procedure: INTRAOPERATIVE TRANSTHORACIC ECHOCARDIOGRAM;  Surgeon: Tonny Bollman, MD;  Location: [facility] OR;  Service: Open Heart Surgery;  Laterality: N/A;   MITRAL VALVE REPAIR      RIGHT/LEFT HEART CATH AND CORONARY/GRAFT ANGIOGRAPHY N/A 04/28/2019   Procedure: RIGHT/LEFT HEART CATH AND CORONARY/GRAFT ANGIOGRAPHY;  Surgeon: Tonny Bollman, MD;  Location: [facility] INVASIVE CV LAB;  Service: Cardiovascular;  Laterality: N/A;   SUBMUCOSAL TATTOO INJECTION  10/14/2019   Procedure: ENDOBRONCHIAL DYE INJECTION;  Surgeon: Josephine Igo, DO;  Location: [facility] ENDOSCOPY;  Service: Pulmonary;;   TEE WITHOUT CARDIOVERSION N/A 05/20/2019   Procedure: TRANSESOPHAGEAL ECHOCARDIOGRAM (TEE);  Surgeon: Little Ishikawa, MD;  Location: [facility] ENDOSCOPY;  Service: Cardiovascular;  Laterality: N/A;   TRANSCATHETER AORTIC VALVE REPLACEMENT, TRANSFEMORAL N/A 05/26/2019   Procedure: TRANSCATHETER AORTIC VALVE REPLACEMENT, TRANSFEMORAL;  Surgeon: Tonny Bollman, MD;  Location: [facility] OR;  Service: Open Heart Surgery;  Laterality: N/A;   VIDEO BRONCHOSCOPY WITH ENDOBRONCHIAL NAVIGATION N/A 10/14/2019   Procedure: VIDEO BRONCHOSCOPY;  Surgeon: Josephine Igo, DO;  Location: [facility] ENDOSCOPY;  Service: Pulmonary;  Laterality: N/A;     A IV Location/Drains/Wounds Patient Lines/Drains/Airways Status     Active Line/Drains/Airways     Name Placement date Placement time Site Days   Peripheral IV 07/26/22 20 G Right Forearm 07/26/22  2130  Forearm  1            Intake/Output Last 24 hours No intake or output data in the 24 hours ending 07/27/22 0931  Labs/Imaging Results for orders placed or performed during the hospital encounter of 07/26/22 (from the past 48 hour(s))  Basic metabolic panel     Status: Abnormal   Collection Time: 07/26/22  4:44 PM  Result Value Ref Range   Sodium 140 135 - 145 mmol/L   Potassium 3.9 3.5 - 5.1 mmol/L   Chloride 100 98 - 111 mmol/L   CO2 29 22 - 32 mmol/L   Glucose, Bld 96 70 - 99 mg/dL    Comment: Glucose reference range applies only to samples taken after fasting for at least 8 hours.   BUN 41 (H) 8 - 23 mg/dL   Creatinine, Ser 1.61 (H) 0.61 - 1.24 mg/dL   Calcium  9.3 8.9 - 09.6 mg/dL   GFR, Estimated 22 (L) >60 mL/min    Comment: (NOTE) Calculated using the CKD-EPI Creatinine Equation (2021)    Anion gap 11 5 - 15    Comment: Performed at [facility], [address] [place], [place], [place] [address]  CBC     Status: Abnormal   Collection Time: 07/26/22  4:44 PM  Result Value Ref Range   WBC 7.5 4.0 - 10.5 K/uL   RBC 4.68 4.22 - 5.81 MIL/uL   Hemoglobin 13.7 13.0 - 17.0 g/dL   HCT 98.1 19.1 - 47.8 %   MCV 89.5 80.0 - 100.0 fL   MCH 29.3 26.0 - 34.0 pg   MCHC 32.7 30.0 - 36.0 g/dL   RDW 29.5 62.1 - 30.8 %   Platelets 99 (L) 150 - 400 K/uL    Comment:  SPECIMEN CHECKED FOR CLOTS REPEATED TO VERIFY PLATELET COUNT CONFIRMED BY SMEAR    nRBC 0.0 0.0 - 0.2 %    Comment: Performed at [facility], [address] [place], [place] [address]  Lactic acid, plasma     Status: None   Collection Time: 07/26/22  9:21 PM  Result Value Ref Range   Lactic Acid, Venous 1.2 0.5 - 1.9 mmol/L    Comment: Performed at [facility], [address], [place], [place] [address]  Uric acid     Status: Abnormal   Collection Time: 07/26/22 10:20 PM  Result Value Ref Range   Uric Acid, Serum 11.0 (H) 3.7 - 8.6 mg/dL    Comment: Performed at [facility], [address] [place], [place] [address]  Blood culture (routine x 2)     Status: None (Preliminary result)   Collection Time: 07/27/22  2:26 AM   Specimen: BLOOD LEFT ARM  Result Value Ref Range   Specimen Description BLOOD LEFT ARM    Special Requests      BOTTLES DRAWN AEROBIC AND ANAEROBIC Blood Culture results may not be optimal due to an excessive volume of blood received in culture bottles   Culture      NO GROWTH < 12 HOURS Performed at [facility], [address] [place], [place] [address]    Report Status PENDING   Lactic acid, plasma     Status: None   Collection Time: 07/27/22  2:26 AM  Result Value Ref Range    Lactic Acid, Venous 1.0 0.5 - 1.9 mmol/L    Comment: Performed at [facility], [address] [place], [place] [address]  Basic metabolic panel     Status: Abnormal   Collection Time: 07/27/22  2:26 AM  Result Value Ref Range   Sodium 137 135 - 145 mmol/L   Potassium 3.5 3.5 - 5.1 mmol/L   Chloride 105 98 - 111 mmol/L   CO2 23 22 - 32 mmol/L   Glucose, Bld 115 (H) 70 - 99 mg/dL    Comment: Glucose reference range applies only to samples taken after fasting for at least 8 hours.   BUN 33 (H) 8 - 23 mg/dL   Creatinine, Ser 1.30 (H) 0.61 - 1.24 mg/dL   Calcium 8.6 (L) 8.9 - 10.3 mg/dL   GFR, Estimated 27 (L) >60 mL/min    Comment: (NOTE) Calculated using the CKD-EPI Creatinine Equation (2021)    Anion gap 9 5 - 15    Comment: Performed at [facility], [address] [place], [place] [address]  CBC     Status: Abnormal   Collection Time: 07/27/22  2:26 AM  Result Value Ref Range   WBC 8.4 4.0 - 10.5 K/uL   RBC 4.70 4.22 - 5.81 MIL/uL   Hemoglobin 13.8 13.0 - 17.0 g/dL   HCT 46.9 62.9 - 52.8 %   MCV 89.1 80.0 - 100.0 fL   MCH 29.4 26.0 - 34.0 pg   MCHC 32.9 30.0 - 36.0 g/dL   RDW 41.3 24.4 - 01.0 %   Platelets 86 (L) 150 - 400 K/uL    Comment: Immature Platelet Fraction may be clinically indicated, consider ordering this additional test [id number] REPEATED TO VERIFY    nRBC 0.0 0.0 - 0.2 %    Comment: Performed at [facility], [address], [place], [place] [address]   DG Finger Ring Left  Result Date: 07/26/2022 CLINICAL  DATA:  Pain, redness, swelling, and streaking at LEFT ring finger for 2 days, no injury EXAM: LEFT RING FINGER 2+V COMPARISON:  None Available. FINDINGS: Osseous demineralization diffusely. Joint spaces preserved. Soft tissue swelling LEFT ring finger, greatest at PIP joint. No acute fracture, dislocation, or bone destruction. No radiopaque foreign bodies or soft tissue gas. IMPRESSION: Soft tissue swelling LEFT ring finger without osseous  abnormalities. Electronically Signed   By: Ulyses Southward M.D.   On: 07/26/2022 15:33    Pending Labs Unresulted Labs (From admission, onward)     Start     Ordered   07/27/22 1400  Vancomycin, random  Once,   R        07/27/22 0128   07/26/22 2121  Blood culture (routine x 2)  BLOOD CULTURE X 2,   R (with STAT occurrences)      07/26/22 2120            Vitals/Pain Today's Vitals   07/27/22 0430 07/27/22 0437 07/27/22 0547 07/27/22 0637  BP: (!) 141/81  (!) 145/86 (!) 145/98  Pulse: (!) 113  92 95  Resp:   16 16  Temp:  97.8 F (36.6 C)    TempSrc:  Oral    SpO2: 95%  98% 96%  Weight:      Height:      PainSc:        Isolation Precautions No active isolations  Medications Medications  metoprolol succinate (TOPROL-XL) 24 hr tablet 50 mg (has no administration in time range)  pravastatin (PRAVACHOL) tablet 80 mg (has no administration in time range)  levothyroxine (SYNTHROID) tablet 75 mcg (75 mcg Oral Given 07/27/22 0532)  cyanocobalamin (VITAMIN B12) tablet 1,000 mcg (has no administration in time range)  methylPREDNISolone sodium succinate (SOLU-MEDROL) 125 mg/2 mL injection 80 mg (80 mg Intravenous Given 07/27/22 0201)  ceFEPIme (MAXIPIME) 2 g in sodium chloride 0.9 % 100 mL IVPB (0 g Intravenous Stopped 07/27/22 0235)  vancomycin variable dose per unstable renal function (pharmacist dosing) (has no administration in time range)  fentaNYL (SUBLIMAZE) injection 25 mcg (25 mcg Intravenous Given 07/27/22 0221)  fentaNYL (SUBLIMAZE) injection 50 mcg (50 mcg Intravenous Given 07/26/22 2131)  fentaNYL (SUBLIMAZE) injection 50 mcg (50 mcg Intravenous Given 07/26/22 2250)  vancomycin (VANCOREADY) IVPB 1500 mg/300 mL (0 mg Intravenous Stopped 07/27/22 0437)    Mobility walks     Focused Assessments Cardiac Assessment Handoff:    Lab Results  Component Value Date   CKTOTAL 55 05/01/2016   CKMB 1.7 05/01/2016   TROPONINI <0.03 12/24/2017   No results found for:  "DDIMER" Does the Patient currently have chest pain? No    R Recommendations: See Admitting Provider Note  Report given to:   Additional Notes:

## 2022-07-27 NOTE — Progress Notes (Signed)
PROGRESS NOTE    [name]  UJW:[id number] DOB: 04-13-1931 DOA: 07/26/2022 PCP: Johny Blamer, MD   Brief Narrative:  HPI: [name] is a 89 y.o. male with history of CAD status post CABG, status post TAVR, status post mitral valve repair, chronic kidney disease, atrial fibrillation, chronic thrombocytopenia presents to the ER because of worsening pain and swelling involving the left ring finger specifically the proximal interphalangeal joint.     ED Course: In the ER patient was afebrile.  On exam has significant swelling involving the left ring finger proximal interphalangeal joint.  Warm to touch.  Uric acid levels was elevated.  Dr. Merlyn Lot orthopedic surgeon on-call evaluated the patient at this time recommended treating for possible gout and he will be following up to check for any improvement and also at this time empirically kept on antibiotics.  Assessment & Plan:   Left ring finger proximal interphalangeal joint swelling: -Likely in the setting of gout.  X-ray showed soft tissue swelling without osseous abnormalities.   -Uric acid elevated at 11.0.  He is afebrile with no leukocytosis.  Lactic acid: WNL.  Blood culture no growth till date  -continue Solu-Medrol.  Discontinue IV antibiotics.  Appreciated hand surgery consult. -Continue as needed pain medications  History of atrial fibrillation rate controlled on beta-blockers.  Resume Eliquis.    CAD status post CABG  -denies any chest pain.  Holding Eliquis.  Continue beta-blocker statins.  Chronic kidney disease stage III creatinine at her baseline. -Continue to monitor  Hypothyroidism on Synthroid.  History of TAVR and mitral valve repair.  Thrombocytopenia: Platelet count at baseline.  Chronic.  Continue to monitor  DVT prophylaxis: Eliquis Code Status: Full code Family Communication:  None present at bedside.  Plan of care discussed with patient in length and he verbalized understanding and agreed with  it. Disposition Plan: Likely home   Consultants:  Ortho  Procedures:  None  Antimicrobials:  Cefepime Vancomycin  Status is: Observation    Subjective: Patient seen and examined.  Reports improvement in swelling and redness in left hand ring finger.  Remained afebrile.  No acute event overnight.  Objective: Vitals:   07/27/22 0547 07/27/22 0637 07/27/22 1000 07/27/22 1051  BP: (!) 145/86 (!) 145/98 137/79 (!) 159/96  Pulse: 92 95 (!) 110 (!) 102  Resp: 16 16 18 16   Temp:   97.9 F (36.6 C) (!) 97.5 F (36.4 C)  TempSrc:    Oral  SpO2: 98% 96% 94% 99%  Weight:      Height:       No intake or output data in the 24 hours ending 07/27/22 1255 Filed Weights   07/26/22 1640  Weight: 74.8 kg    Examination:  General exam: Appears calm and comfortable, on room air, communicating well Respiratory system: Clear to auscultation. Respiratory effort normal. Cardiovascular system: S1 & S2 heard, RRR. No JVD, murmurs, rubs, gallops or clicks. No pedal edema. Gastrointestinal system: Abdomen is nondistended, soft and nontender. No organomegaly or masses felt. Normal bowel sounds heard. Central nervous system: Alert and oriented. No focal neurological deficits. Extremities: Left hand ring finger: Swollen, erythematous, warm to touch, tenderness positive skin: No rashes, lesions or ulcers Psychiatry: Judgement and insight appear normal. Mood & affect appropriate.    Data Reviewed: I have personally reviewed following labs and imaging studies  CBC: Recent Labs  Lab 07/26/22 1644 07/27/22 0226  WBC 7.5 8.4  HGB 13.7 13.8  HCT 41.9 41.9  MCV 89.5  89.1  PLT 99* 86*   Basic Metabolic Panel: Recent Labs  Lab 07/26/22 1644 07/27/22 0226  NA 140 137  K 3.9 3.5  CL 100 105  CO2 29 23  GLUCOSE 96 115*  BUN 41* 33*  CREATININE 2.65* 2.27*  CALCIUM 9.3 8.6*   GFR: Estimated Creatinine Clearance: 21.8 mL/min (A) (by C-G formula based on SCr of 2.27 mg/dL (H)). Liver  Function Tests: No results for input(s): "AST", "ALT", "ALKPHOS", "BILITOT", "PROT", "ALBUMIN" in the last 168 hours. No results for input(s): "LIPASE", "AMYLASE" in the last 168 hours. No results for input(s): "AMMONIA" in the last 168 hours. Coagulation Profile: No results for input(s): "INR", "PROTIME" in the last 168 hours. Cardiac Enzymes: No results for input(s): "CKTOTAL", "CKMB", "CKMBINDEX", "TROPONINI" in the last 168 hours. BNP (last 3 results) No results for input(s): "PROBNP" in the last 8760 hours. HbA1C: No results for input(s): "HGBA1C" in the last 72 hours. CBG: No results for input(s): "GLUCAP" in the last 168 hours. Lipid Profile: No results for input(s): "CHOL", "HDL", "LDLCALC", "TRIG", "CHOLHDL", "LDLDIRECT" in the last 72 hours. Thyroid Function Tests: No results for input(s): "TSH", "T4TOTAL", "FREET4", "T3FREE", "THYROIDAB" in the last 72 hours. Anemia Panel: No results for input(s): "VITAMINB12", "FOLATE", "FERRITIN", "TIBC", "IRON", "RETICCTPCT" in the last 72 hours. Sepsis Labs: Recent Labs  Lab 07/26/22 2121 07/27/22 0226  LATICACIDVEN 1.2 1.0    Recent Results (from the past 240 hour(s))  Blood culture (routine x 2)     Status: None (Preliminary result)   Collection Time: 07/27/22  2:26 AM   Specimen: BLOOD LEFT ARM  Result Value Ref Range Status   Specimen Description BLOOD LEFT ARM  Final   Special Requests   Final    BOTTLES DRAWN AEROBIC AND ANAEROBIC Blood Culture results may not be optimal due to an excessive volume of blood received in culture bottles   Culture   Final    NO GROWTH < 12 HOURS Performed at [facility], [address], [place], [place] [address]    Report Status PENDING  Incomplete      Radiology Studies: DG Finger Ring Left  Result Date: 07/26/2022 CLINICAL DATA:  Pain, redness, swelling, and streaking at LEFT ring finger for 2 days, no injury EXAM: LEFT RING FINGER 2+V COMPARISON:  None Available. FINDINGS:  Osseous demineralization diffusely. Joint spaces preserved. Soft tissue swelling LEFT ring finger, greatest at PIP joint. No acute fracture, dislocation, or bone destruction. No radiopaque foreign bodies or soft tissue gas. IMPRESSION: Soft tissue swelling LEFT ring finger without osseous abnormalities. Electronically Signed   By: Ulyses Southward M.D.   On: 07/26/2022 15:33    Scheduled Meds:  cyanocobalamin  1,000 mcg Oral Daily   levothyroxine  75 mcg Oral Q0600   methylPREDNISolone (SOLU-MEDROL) injection  80 mg Intravenous Daily   metoprolol succinate  50 mg Oral Daily   pravastatin  80 mg Oral Q M,W,F   Continuous Infusions:   LOS: 0 days   Time spent: 35 minutes   [name] Estill Cotta, MD [facility]  If 7PM-7AM, please contact night-coverage [email or web address] 07/27/2022, 12:55 PM

## 2022-07-27 NOTE — Progress Notes (Signed)
Pt seen in room alert/oriented [facility]) in no apparent distress. Pt's guide/menu provided to pt/daughter with instructions. Pt/daughter verbalized understanding of instructions. Hospital valuables policy has been discussed with daughter, No complaints. Hospital bed in lowest position with 3 side rails up, call bell/room phone within reach, and all wheels locked.

## 2022-07-27 NOTE — Plan of Care (Signed)
  Problem: Education: Goal: Knowledge of General Education information will improve Description: Including pain rating scale, medication(s)/side effects and non-pharmacologic comfort measures Outcome: Progressing   Problem: Clinical Measurements: Goal: Will remain free from infection Outcome: Progressing   Problem: Coping: Goal: Level of anxiety will decrease Outcome: Progressing   Problem: Elimination: Goal: Will not experience complications related to bowel motility Outcome: Progressing   

## 2022-07-27 NOTE — Progress Notes (Signed)
Patient ID: [name], male   DOB: June 14, 1931, 89 y.o.   MRN: [id number]   LOS: 0 days   Subjective: Finger improved in pain, swelling, and redness   Objective: Vital signs in last 24 hours: Temp:  [97.5 F (36.4 C)-98.4 F (36.9 C)] 97.5 F (36.4 C) (05/10 1051) Pulse Rate:  [61-113] 102 (05/10 1051) Resp:  [13-18] 16 (05/10 1051) BP: (129-180)/(65-98) 159/96 (05/10 1051) SpO2:  [94 %-100 %] 99 % (05/10 1051) Weight:  [74.8 kg] 74.8 kg (05/09 1640)     Laboratory  CBC Recent Labs    07/26/22 1644 07/27/22 0226  WBC 7.5 8.4  HGB 13.7 13.8  HCT 41.9 41.9  PLT 99* 86*   BMET Recent Labs    07/26/22 1644 07/27/22 0226  NA 140 137  K 3.9 3.5  CL 100 105  CO2 29 23  GLUCOSE 96 115*  BUN 41* 33*  CREATININE 2.65* 2.27*  CALCIUM 9.3 8.6*     Physical Exam General appearance: alert and no distress Left hand -- Ring finger continues erythema and edema centered around PIP joint but improved from pics yesterday.   Assessment/Plan: Left ring finger pain -- Gout seems the likely dx here. Continue treatment.    Freeman Caldron, PA-C Orthopedic Surgery [phone] 07/27/2022

## 2022-07-27 NOTE — H&P (Signed)
History and Physical    [name] ZOX:[id number] DOB: 05-13-1931 DOA: 07/26/2022  Patient coming from: Home.  Chief Complaint: Left ring finger pain.  HPI: [name] is a 89 y.o. male with history of CAD status post CABG, status post TAVR, status post mitral valve repair, chronic kidney disease, atrial fibrillation, chronic thrombocytopenia presents to the ER because of worsening pain and swelling involving the left ring finger specifically the proximal interphalangeal joint.  Denies any fever chills or trauma.  Patient states he has had prior history of gout involving the lower extremities.  ED Course: In the ER patient was afebrile.  On exam has significant swelling involving the left ring finger proximal interphalangeal joint.  Warm to touch.  Uric acid levels was elevated.  Dr. Merlyn Lot orthopedic surgeon on-call evaluated the patient at this time recommended treating for possible gout and he will be following up to check for any improvement and also at this time empirically kept on antibiotics.  Review of Systems: As per HPI, rest all negative.   Past Medical History:  Diagnosis Date   Adenomatous colon polyp    CAD (coronary artery disease)    a. S/P CABG x 1 @ time of MV annuloplasty;  b. 02/2010 ETT: neg for ischemia.   Carotid artery stenosis    1-39% by dopplers 10/2016   Chronic combined systolic (congestive) and diastolic (congestive) heart failure (HCC)    CKD (chronic kidney disease), stage III (HCC)    Current use of long term anticoagulation    a. Coumadin in setting of afib.   Diverticulosis    Dyslipidemia    Dysrhythmia    A-fib   Essential hypertension    H/O mitral valve repair 09/16/2002   Complex valvuloplasty including quadrangular resection of posterior leaflet with artificial Gore-tex neochords and 28 mm Seguin ring annuloplasty - Dr Tyrone Sage   Hemorrhoids    Hyperlipidemia    Hypertension    Permanent atrial fibrillation (HCC)    a. CHA2DS2VASc = 6  -->chronic coumadin;  b. Prev on amio-->d/c 2/2 hypothyroidism. >> resumed 5/16. c. Notes from 2017 indicate interstitial lung disease by pulm appt, question amio toxicity, also increased liver attenuation. Rate control pursued and amiodarone discontinued.   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   s/p TAVR w/ a 29 mm Edwards Sapien 3 THV via the TF approach by Drs Excell Seltzer and Cornelius Moras   Sleep apnea 12/2018   per patient's spouse wears BIPAP "every other night or so"   Thrombocytopenia (HCC)    TIA (transient ischemic attack)     Past Surgical History:  Procedure Laterality Date   CATARACT EXTRACTION W/ INTRAOCULAR LENS  IMPLANT, BILATERAL     per patient about 4 years ago   CORONARY ARTERY BYPASS GRAFT  2004   ELECTROMAGNETIC NAVIGATION BROCHOSCOPY  10/14/2019   Procedure: NAVIGATION BRONCHOSCOPY;  Surgeon: Josephine Igo, DO;  Location: [facility] ENDOSCOPY;  Service: Pulmonary;;   INTERCOSTAL NERVE BLOCK Right 10/14/2019   Procedure: INTERCOSTAL NERVE BLOCK;  Surgeon: Delight Ovens, MD;  Location: [facility] OR;  Service: Thoracic;  Laterality: Right;   INTRAOPERATIVE TRANSTHORACIC ECHOCARDIOGRAM N/A 05/26/2019   Procedure: INTRAOPERATIVE TRANSTHORACIC ECHOCARDIOGRAM;  Surgeon: Tonny Bollman, MD;  Location: [facility] OR;  Service: Open Heart Surgery;  Laterality: N/A;   MITRAL VALVE REPAIR     RIGHT/LEFT HEART CATH AND CORONARY/GRAFT ANGIOGRAPHY N/A 04/28/2019   Procedure: RIGHT/LEFT HEART CATH AND  CORONARY/GRAFT ANGIOGRAPHY;  Surgeon: Tonny Bollman, MD;  Location: [facility] INVASIVE CV LAB;  Service: Cardiovascular;  Laterality: N/A;   SUBMUCOSAL TATTOO INJECTION  10/14/2019   Procedure: ENDOBRONCHIAL DYE INJECTION;  Surgeon: Josephine Igo, DO;  Location: [facility]OSCOPY;  Service: Pulmonary;;   TEE WITHOUT CARDIOVERSION N/A 05/20/2019   Procedure: TRANSESOPHAGEAL ECHOCARDIOGRAM (TEE);  Surgeon: Little Ishikawa, MD;  Location: [facility] ENDOSCOPY;  Service:  Cardiovascular;  Laterality: N/A;   TRANSCATHETER AORTIC VALVE REPLACEMENT, TRANSFEMORAL N/A 05/26/2019   Procedure: TRANSCATHETER AORTIC VALVE REPLACEMENT, TRANSFEMORAL;  Surgeon: Tonny Bollman, MD;  Location: [facility] OR;  Service: Open Heart Surgery;  Laterality: N/A;   VIDEO BRONCHOSCOPY WITH ENDOBRONCHIAL NAVIGATION N/A 10/14/2019   Procedure: VIDEO BRONCHOSCOPY;  Surgeon: Josephine Igo, DO;  Location: [facility] ENDOSCOPY;  Service: Pulmonary;  Laterality: N/A;     reports that he quit smoking about 67 years ago. His smoking use included cigarettes. He has a 0.20 pack-year smoking history. He has never used smokeless tobacco. He reports that he does not drink alcohol and does not use drugs.  Allergies  Allergen Reactions   Asa [Aspirin] Other (See Comments)    Bleeding- has internal hemorrhoids   Flomax [Tamsulosin] Palpitations and Other (See Comments)    Made his heart go out of rhythm   Lipitor [Atorvastatin]     "loss of muscle control"   Mirtazapine     "Tremor"   Pirfenidone     Weight loss, loss of appetite.   Tramadol     Nausea/vomiting    Family History  Problem Relation Age of Onset   Stroke Mother        Deceased   Heart disease Father        Deceased   Heart failure Brother        Deceased   Stroke Brother     Prior to Admission medications   Medication Sig Start Date End Date Taking? Authorizing Provider  apixaban (ELIQUIS) 5 MG TABS tablet Take 2.5 mg by mouth 2 (two) times daily. 02/13/22  Yes [provider]  levothyroxine (SYNTHROID) 75 MCG tablet Take 75 mcg by mouth daily before breakfast.    Yes [provider]  metoprolol succinate (TOPROL-XL) 50 MG 24 hr tablet TAKE ONE TABLET BY MOUTH ONE TIME DAILY Patient taking differently: Take 50 mg by mouth daily. 11/10/19  Yes Jodelle Red, MD  Polyethyl Glycol-Propyl Glycol (SYSTANE OP) Place 1 drop into both eyes 3 (three) times daily as needed (dry/irritated eyes.).    Yes [provider]  pravastatin (PRAVACHOL) 80 MG tablet Take 80 mg by mouth every Monday, Wednesday, and Friday. Taken nightly   Yes [provider]  torsemide (DEMADEX) 20 MG tablet Take 20-40 mg by mouth See admin instructions. Take 2 tablets (40 mg) by mouth in the morning & take 1 tablet (20 mg) by mouth at night.   Yes [provider]  vitamin B-12 (CYANOCOBALAMIN) 1000 MCG tablet Take 1,000 mcg by mouth daily.    Yes [provider]  lidocaine 4 % Cut patch in half and apply to painful areas 1 hour before bed 06/08/22   Elson Areas, [place]    Physical Exam: Constitutional: Moderately built and nourished. Vitals:   07/26/22 2138 07/26/22 2145 07/26/22 2245 07/27/22 0030  BP: (!) 169/76 (!) 180/81 (!) 140/77 (!) 144/85  Pulse: 66 67 72 76  Resp: 16 13 15    Temp: 98.4 F (36.9 C)  97.8 F (36.6 C)  TempSrc:   Oral   SpO2: 95% 96% 100% 97%  Weight:      Height:       Eyes: Anicteric no pallor. ENMT: No discharge from the ears eyes nose or mouth. Neck: Normal supple.  No neck rigidity. Respiratory: No rhonchi or crepitations. Cardiovascular: S1-S2 heard. Abdomen: Soft nontender bowel sound present. Musculoskeletal: Swelling of the left fourth finger proximal interphalangeal joint fingers in a flexed position. Skin: Swelling of the left ring finger. Neurologic: Alert awake oriented to time place and person.  Moves all extremities. Psychiatric: Appears normal  Affect.   Labs on Admission: I have personally reviewed following labs and imaging studies  CBC: Recent Labs  Lab 07/26/22 1644  WBC 7.5  HGB 13.7  HCT 41.9  MCV 89.5  PLT 99*   Basic Metabolic Panel: Recent Labs  Lab 07/26/22 1644  NA 140  K 3.9  CL 100  CO2 29  GLUCOSE 96  BUN 41*  CREATININE 2.65*  CALCIUM 9.3   GFR: Estimated Creatinine Clearance: 18.6 mL/min (A) (by C-G formula based on SCr of 2.65 mg/dL (H)). Liver Function Tests: No results for input(s): "AST",  "ALT", "ALKPHOS", "BILITOT", "PROT", "ALBUMIN" in the last 168 hours. No results for input(s): "LIPASE", "AMYLASE" in the last 168 hours. No results for input(s): "AMMONIA" in the last 168 hours. Coagulation Profile: No results for input(s): "INR", "PROTIME" in the last 168 hours. Cardiac Enzymes: No results for input(s): "CKTOTAL", "CKMB", "CKMBINDEX", "TROPONINI" in the last 168 hours. BNP (last 3 results) No results for input(s): "PROBNP" in the last 8760 hours. HbA1C: No results for input(s): "HGBA1C" in the last 72 hours. CBG: No results for input(s): "GLUCAP" in the last 168 hours. Lipid Profile: No results for input(s): "CHOL", "HDL", "LDLCALC", "TRIG", "CHOLHDL", "LDLDIRECT" in the last 72 hours. Thyroid Function Tests: No results for input(s): "TSH", "T4TOTAL", "FREET4", "T3FREE", "THYROIDAB" in the last 72 hours. Anemia Panel: No results for input(s): "VITAMINB12", "FOLATE", "FERRITIN", "TIBC", "IRON", "RETICCTPCT" in the last 72 hours. Urine analysis:    Component Value Date/Time   COLORURINE YELLOW 05/23/2022 1316   APPEARANCEUR CLEAR 05/23/2022 1316   LABSPEC 1.010 05/23/2022 1316   PHURINE 5.5 05/23/2022 1316   GLUCOSEU NEGATIVE 05/23/2022 1316   HGBUR SMALL (A) 05/23/2022 1316   BILIRUBINUR NEGATIVE 05/23/2022 1316   KETONESUR NEGATIVE 05/23/2022 1316   PROTEINUR TRACE (A) 05/23/2022 1316   NITRITE NEGATIVE 05/23/2022 1316   LEUKOCYTESUR NEGATIVE 05/23/2022 1316   Sepsis Labs: @LABRCNTIP (procalcitonin:4,lacticidven:4) )No results found for this or any previous visit (from the past 240 hour(s)).   Radiological Exams on Admission: DG Finger Ring Left  Result Date: 07/26/2022 CLINICAL DATA:  Pain, redness, swelling, and streaking at LEFT ring finger for 2 days, no injury EXAM: LEFT RING FINGER 2+V COMPARISON:  None Available. FINDINGS: Osseous demineralization diffusely. Joint spaces preserved. Soft tissue swelling LEFT ring finger, greatest at PIP joint. No acute  fracture, dislocation, or bone destruction. No radiopaque foreign bodies or soft tissue gas. IMPRESSION: Soft tissue swelling LEFT ring finger without osseous abnormalities. Electronically Signed   By: Ulyses Southward M.D.   On: 07/26/2022 15:33    Assessment/Plan Principal Problem:   Hand swelling Active Problems:   H/O mitral valve repair   Permanent atrial fibrillation   Dyslipidemia   Chronic kidney disease (CKD), stage III (moderate) (HCC)   ILD (interstitial lung disease) (HCC)   S/P CABG x 1   Thrombocytopenia (HCC)   S/P TAVR (transcatheter aortic valve replacement)  Anemia    Left ring finger proximal interphalangeal joint swelling -    differentials include gout versus septic arthritis.  Appreciate hand surgery consult.  Patient on Solu-Medrol for possible gout and empiric antibiotic started.  Hand surgeon to reevaluate patient in the morning to see if patient needs any surgical intervention.  Follow cultures. History of atrial fibrillation rate controlled on beta-blockers.  Holding Eliquis in case patient needs procedure for the left hand. CAD status post CABG denies any chest pain.  Holding Eliquis.  Continue beta-blocker statins. Chronic thrombocytopenia follow CBC. Chronic kidney disease stage III creatinine at her baseline. Hypothyroidism on Synthroid. History of TAVR and mitral valve repair.   DVT prophylaxis: SCDs. Code Status: Full code. Family Communication: Discussed with patient. Disposition Plan: Monitored bed. Consults called: Hydrographic surveyor. Admission status: Observation.

## 2022-07-28 DIAGNOSIS — M7989 Other specified soft tissue disorders: Secondary | ICD-10-CM | POA: Diagnosis not present

## 2022-07-28 LAB — BASIC METABOLIC PANEL
Anion gap: 12 (ref 5–15)
BUN: 38 mg/dL — ABNORMAL HIGH (ref 8–23)
CO2: 22 mmol/L (ref 22–32)
Calcium: 8.8 mg/dL — ABNORMAL LOW (ref 8.9–10.3)
Chloride: 103 mmol/L (ref 98–111)
Creatinine, Ser: 2.23 mg/dL — ABNORMAL HIGH (ref 0.61–1.24)
GFR, Estimated: 28 mL/min — ABNORMAL LOW (ref >60)
Glucose, Bld: 133 mg/dL — ABNORMAL HIGH (ref 70–99)
Potassium: 3.5 mmol/L (ref 3.5–5.1)
Sodium: 137 mmol/L (ref 135–145)

## 2022-07-28 LAB — CBC
HCT: 40.2 % (ref 39.0–52.0)
Hemoglobin: 13.3 g/dL (ref 13.0–17.0)
MCH: 28.5 pg (ref 26.0–34.0)
MCHC: 33.1 g/dL (ref 30.0–36.0)
MCV: 86.1 fL (ref 80.0–100.0)
Platelets: 105 10*3/uL — ABNORMAL LOW (ref 150–400)
RBC: 4.67 MIL/uL (ref 4.22–5.81)
RDW: 14.2 % (ref 11.5–15.5)
WBC: 10.9 10*3/uL — ABNORMAL HIGH (ref 4.0–10.5)
nRBC: 0 % (ref 0.0–0.2)

## 2022-07-28 MED ORDER — PREDNISONE 20 MG PO TABS: 40.0000 mg | ORAL_TABLET | Freq: Every day | ORAL | 0 refills | Status: AC

## 2022-07-28 NOTE — Discharge Summary (Signed)
Physician Discharge Summary  [name] ZOX:[id number] DOB: Nov 15, 1931 DOA: 07/26/2022  PCP: Johny Blamer, MD  Admit date: 07/26/2022 Discharge date: 07/28/2022  Admitted From: Home Disposition:  Home  Recommendations for Outpatient Follow-up:  Follow up with PCP in 1-2 weeks Please obtain BMP/CBC in one week Follow-up on pending blood culture result Follow-up with Dr. Merlyn Lot in 1 week  Home Health: None Equipment/Devices: None Discharge Condition: Stable CODE STATUS: Full code Diet recommendation: Heart healthy diet  Brief/Interim Summary: HPI: [name] is a 89 y.o. male with history of CAD status post CABG, status post TAVR, status post mitral valve repair, chronic kidney disease, atrial fibrillation, chronic thrombocytopenia presents to the ER because of worsening pain and swelling involving the left ring finger specifically the proximal interphalangeal joint.     ED Course: In the ER patient was afebrile.  On exam has significant swelling involving the left ring finger proximal interphalangeal joint.  Warm to touch.  Uric acid levels was elevated.  Dr. Merlyn Lot orthopedic surgeon on-call evaluated the patient at this time recommended treating for possible gout and he will be following up to check for any improvement and also at this time empirically kept on antibiotics.  Left ring finger proximal interphalangeal joint swelling: -Likely in the setting of gout.  X-ray showed soft tissue swelling without osseous abnormalities.   -Uric acid elevated at 11.0.  He remained afebrile  Lactic acid: WNL.  Blood culture no growth till date  -Patient started on IV Solu-Medrol and broad-spectrum antibiotics. Appreciated hand surgery recommendation-Symptoms improved significantly with Solu-Medrol.  IV antibiotics discontinued.  -Okay to discharge from hand surgery standpoint.  Will discharge him on prednisone 40 mg daily for 5 days.  Follow-up with Dr. Merlyn Lot outpatient in 1 week  Mild  leukocytosis: Likely in the setting of his steroids.  He remained afebrile.  Repeat CBC with PCP   History of atrial fibrillation: Continued beta-blocker and Eliquis  CAD status post CABG  -denies any chest pain.  Continued beta-blocker, statin, Eliquis  Chronic kidney disease stage III creatinine at her baseline.   Hypothyroidism continued Synthroid.   History of TAVR and mitral valve repair.   Thrombocytopenia: Platelet count at baseline.  Chronic.      Discharge Diagnoses:  Left ring finger gout Mild leukocytosis History of A-fib Coronary artery disease status post CABG CKD stage III Hypothyroidism History of TAVR and mitral valve repair Thrombocytopenia-chronic  Discharge Instructions  Discharge Instructions     Diet - low sodium heart healthy   Complete by: As directed    Increase activity slowly   Complete by: As directed       Allergies as of 07/28/2022       Reactions   Asa [aspirin] Other (See Comments)   Bleeding- has internal hemorrhoids   Flomax [tamsulosin] Palpitations, Other (See Comments)   Made his heart go out of rhythm   Lipitor [atorvastatin]    "loss of muscle control"   Mirtazapine    "Tremor"   Pirfenidone    Weight loss, loss of appetite.   Tramadol    Nausea/vomiting        Medication List     TAKE these medications    apixaban 5 MG Tabs tablet Commonly known as: ELIQUIS Take 2.5 mg by mouth 2 (two) times daily.   cyanocobalamin 1000 MCG tablet Commonly known as: VITAMIN B12 Take 1,000 mcg by mouth daily.   levothyroxine 75 MCG tablet Commonly known as: SYNTHROID Take 75 mcg  by mouth daily before breakfast.   lidocaine 4 % Cut patch in half and apply to painful areas 1 hour before bed   metoprolol succinate 50 MG 24 hr tablet Commonly known as: TOPROL-XL TAKE ONE TABLET BY MOUTH ONE TIME DAILY   pravastatin 80 MG tablet Commonly known as: PRAVACHOL Take 80 mg by mouth every Monday, Wednesday, and Friday. Taken  nightly   predniSONE 20 MG tablet Commonly known as: DELTASONE Take 2 tablets (40 mg total) by mouth daily for 5 days.   SYSTANE OP Place 1 drop into both eyes 3 (three) times daily as needed (dry/irritated eyes.).   torsemide 20 MG tablet Commonly known as: DEMADEX Take 20-40 mg by mouth See admin instructions. Take 2 tablets (40 mg) by mouth in the morning & take 1 tablet (20 mg) by mouth at night.        Follow-up Information     Johny Blamer, MD Follow up.   Specialty: Family Medicine Contact information: [phone] [place] [address] [phone]         Betha Loa, MD. Call in 1 week(s).   Specialty: Orthopedic Surgery Contact information: [address] [place] [address] [phone]                Allergies  Allergen Reactions   Asa [Aspirin] Other (See Comments)    Bleeding- has internal hemorrhoids   Flomax [Tamsulosin] Palpitations and Other (See Comments)    Made his heart go out of rhythm   Lipitor [Atorvastatin]     "loss of muscle control"   Mirtazapine     "Tremor"   Pirfenidone     Weight loss, loss of appetite.   Tramadol     Nausea/vomiting    Consultations: Hand surgeon   Procedures/Studies: DG Finger Ring Left  Result Date: 07/26/2022 CLINICAL DATA:  Pain, redness, swelling, and streaking at LEFT ring finger for 2 days, no injury EXAM: LEFT RING FINGER 2+V COMPARISON:  None Available. FINDINGS: Osseous demineralization diffusely. Joint spaces preserved. Soft tissue swelling LEFT ring finger, greatest at PIP joint. No acute fracture, dislocation, or bone destruction. No radiopaque foreign bodies or soft tissue gas. IMPRESSION: Soft tissue swelling LEFT ring finger without osseous abnormalities. Electronically Signed   By: Ulyses Southward M.D.   On: 07/26/2022 15:33      Subjective: Patient seen and examined.  Resting comfortably on the bed.  Reports significant improvement in pain, redness and  swelling in left index finger.  Remained afebrile.  Wishes to go home.  Discharge Exam: Vitals:   07/27/22 1940 07/28/22 0754  BP: 134/75 130/76  Pulse:  71  Resp:  16  Temp: 97.8 F (36.6 C) 98.4 F (36.9 C)  SpO2: 97% 98%   Vitals:   07/27/22 1000 07/27/22 1051 07/27/22 1940 07/28/22 0754  BP: 137/79 (!) 159/96 134/75 130/76  Pulse: (!) 110 (!) 102  71  Resp: 18 16  16   Temp: 97.9 F (36.6 C) (!) 97.5 F (36.4 C) 97.8 F (36.6 C) 98.4 F (36.9 C)  TempSrc:  Oral Oral Oral  SpO2: 94% 99% 97% 98%  Weight:      Height:        General: Pt is alert, awake, not in acute distress, on room air, communicating well Cardiovascular: RRR, S1/S2 +, no rubs, no gallops Respiratory: CTA bilaterally, no wheezing, no rhonchi Abdominal: Soft, NT, ND, bowel sounds + Extremities: Left ring finger swollen and slightly erythematous however improved  significantly since admission   The results of significant diagnostics from this hospitalization (including imaging, microbiology, ancillary and laboratory) are listed below for reference.     Microbiology: Recent Results (from the past 240 hour(s))  Blood culture (routine x 2)     Status: None (Preliminary result)   Collection Time: 07/27/22  2:26 AM   Specimen: BLOOD LEFT ARM  Result Value Ref Range Status   Specimen Description BLOOD LEFT ARM  Final   Special Requests   Final    BOTTLES DRAWN AEROBIC AND ANAEROBIC Blood Culture results may not be optimal due to an excessive volume of blood received in culture bottles   Culture   Final    NO GROWTH < 12 HOURS Performed at [facility] Lab, [address] [place], [place] [address]    Report Status PENDING  Incomplete     Labs: BNP (last 3 results) No results for input(s): "BNP" in the last 8760 hours. Basic Metabolic Panel: Recent Labs  Lab 07/26/22 1644 07/27/22 0226 07/28/22 0305  NA 140 137 137  K 3.9 3.5 3.5  CL 100 105 103  CO2 29 23 22   GLUCOSE 96 115* 133*  BUN  41* 33* 38*  CREATININE 2.65* 2.27* 2.23*  CALCIUM 9.3 8.6* 8.8*   Liver Function Tests: No results for input(s): "AST", "ALT", "ALKPHOS", "BILITOT", "PROT", "ALBUMIN" in the last 168 hours. No results for input(s): "LIPASE", "AMYLASE" in the last 168 hours. No results for input(s): "AMMONIA" in the last 168 hours. CBC: Recent Labs  Lab 07/26/22 1644 07/27/22 0226 07/28/22 0305  WBC 7.5 8.4 10.9*  HGB 13.7 13.8 13.3  HCT 41.9 41.9 40.2  MCV 89.5 89.1 86.1  PLT 99* 86* 105*   Cardiac Enzymes: No results for input(s): "CKTOTAL", "CKMB", "CKMBINDEX", "TROPONINI" in the last 168 hours. BNP: Invalid input(s): "POCBNP" CBG: No results for input(s): "GLUCAP" in the last 168 hours. D-Dimer No results for input(s): "DDIMER" in the last 72 hours. Hgb A1c No results for input(s): "HGBA1C" in the last 72 hours. Lipid Profile No results for input(s): "CHOL", "HDL", "LDLCALC", "TRIG", "CHOLHDL", "LDLDIRECT" in the last 72 hours. Thyroid function studies No results for input(s): "TSH", "T4TOTAL", "T3FREE", "THYROIDAB" in the last 72 hours.  Invalid input(s): "FREET3" Anemia work up No results for input(s): "VITAMINB12", "FOLATE", "FERRITIN", "TIBC", "IRON", "RETICCTPCT" in the last 72 hours. Urinalysis    Component Value Date/Time   COLORURINE YELLOW 05/23/2022 1316   APPEARANCEUR CLEAR 05/23/2022 1316   LABSPEC 1.010 05/23/2022 1316   PHURINE 5.5 05/23/2022 1316   GLUCOSEU NEGATIVE 05/23/2022 1316   HGBUR SMALL (A) 05/23/2022 1316   BILIRUBINUR NEGATIVE 05/23/2022 1316   KETONESUR NEGATIVE 05/23/2022 1316   PROTEINUR TRACE (A) 05/23/2022 1316   NITRITE NEGATIVE 05/23/2022 1316   LEUKOCYTESUR NEGATIVE 05/23/2022 1316   Sepsis Labs Recent Labs  Lab 07/26/22 1644 07/27/22 0226 07/28/22 0305  WBC 7.5 8.4 10.9*   Microbiology Recent Results (from the past 240 hour(s))  Blood culture (routine x 2)     Status: None (Preliminary result)   Collection Time: 07/27/22  2:26 AM    Specimen: BLOOD LEFT ARM  Result Value Ref Range Status   Specimen Description BLOOD LEFT ARM  Final   Special Requests   Final    BOTTLES DRAWN AEROBIC AND ANAEROBIC Blood Culture results may not be optimal due to an excessive volume of blood received in culture bottles   Culture   Final    NO GROWTH <  12 HOURS Performed at [facility], [address], [place], [place] [address]    Report Status PENDING  Incomplete     Time coordinating discharge: Over 30 minutes  SIGNED:   Ollen Bowl, MD  [facility] 07/28/2022, 9:38 AM Pager   If 7PM-7AM, please contact night-coverage [email or web address]

## 2022-07-28 NOTE — Plan of Care (Signed)

## 2022-07-28 NOTE — Progress Notes (Signed)
Discharge instructions given. Patient verbalized understanding and all questions were answered.  ?

## 2022-08-01 DIAGNOSIS — M1000 Idiopathic gout, unspecified site: Secondary | ICD-10-CM | POA: Diagnosis not present

## 2022-08-01 LAB — CULTURE, BLOOD (ROUTINE X 2)
Culture: NO GROWTH
Culture: NO GROWTH
Special Requests: ADEQUATE

## 2022-08-06 DIAGNOSIS — R7303 Prediabetes: Secondary | ICD-10-CM | POA: Diagnosis not present

## 2022-08-06 DIAGNOSIS — I4891 Unspecified atrial fibrillation: Secondary | ICD-10-CM | POA: Diagnosis not present

## 2022-08-06 DIAGNOSIS — D72829 Elevated white blood cell count, unspecified: Secondary | ICD-10-CM | POA: Diagnosis not present

## 2022-08-06 DIAGNOSIS — N184 Chronic kidney disease, stage 4 (severe): Secondary | ICD-10-CM | POA: Diagnosis not present

## 2022-08-06 DIAGNOSIS — Z8739 Personal history of other diseases of the musculoskeletal system and connective tissue: Secondary | ICD-10-CM | POA: Diagnosis not present

## 2022-08-06 DIAGNOSIS — I10 Essential (primary) hypertension: Secondary | ICD-10-CM | POA: Diagnosis not present

## 2022-08-06 DIAGNOSIS — D6869 Other thrombophilia: Secondary | ICD-10-CM | POA: Diagnosis not present

## 2022-08-08 DIAGNOSIS — C4442 Squamous cell carcinoma of skin of scalp and neck: Secondary | ICD-10-CM | POA: Diagnosis not present

## 2022-08-08 DIAGNOSIS — Z85828 Personal history of other malignant neoplasm of skin: Secondary | ICD-10-CM | POA: Diagnosis not present

## 2022-08-15 DIAGNOSIS — Z8739 Personal history of other diseases of the musculoskeletal system and connective tissue: Secondary | ICD-10-CM | POA: Diagnosis not present

## 2022-08-22 DIAGNOSIS — N184 Chronic kidney disease, stage 4 (severe): Secondary | ICD-10-CM | POA: Diagnosis not present

## 2022-08-24 DIAGNOSIS — M10342 Gout due to renal impairment, left hand: Secondary | ICD-10-CM | POA: Diagnosis not present

## 2022-08-27 DIAGNOSIS — I509 Heart failure, unspecified: Secondary | ICD-10-CM | POA: Diagnosis not present

## 2022-08-27 DIAGNOSIS — N184 Chronic kidney disease, stage 4 (severe): Secondary | ICD-10-CM | POA: Diagnosis not present

## 2022-08-27 DIAGNOSIS — N2581 Secondary hyperparathyroidism of renal origin: Secondary | ICD-10-CM | POA: Diagnosis not present

## 2022-08-27 DIAGNOSIS — D631 Anemia in chronic kidney disease: Secondary | ICD-10-CM | POA: Diagnosis not present

## 2022-08-27 DIAGNOSIS — I129 Hypertensive chronic kidney disease with stage 1 through stage 4 chronic kidney disease, or unspecified chronic kidney disease: Secondary | ICD-10-CM | POA: Diagnosis not present

## 2022-11-14 DIAGNOSIS — I130 Hypertensive heart and chronic kidney disease with heart failure and stage 1 through stage 4 chronic kidney disease, or unspecified chronic kidney disease: Secondary | ICD-10-CM | POA: Diagnosis not present

## 2022-11-14 DIAGNOSIS — E039 Hypothyroidism, unspecified: Secondary | ICD-10-CM | POA: Diagnosis not present

## 2022-11-14 DIAGNOSIS — D6869 Other thrombophilia: Secondary | ICD-10-CM | POA: Diagnosis not present

## 2022-11-14 DIAGNOSIS — N184 Chronic kidney disease, stage 4 (severe): Secondary | ICD-10-CM | POA: Diagnosis not present

## 2022-11-14 DIAGNOSIS — H9193 Unspecified hearing loss, bilateral: Secondary | ICD-10-CM | POA: Diagnosis not present

## 2022-11-14 DIAGNOSIS — J849 Interstitial pulmonary disease, unspecified: Secondary | ICD-10-CM | POA: Diagnosis not present

## 2022-11-14 DIAGNOSIS — I2510 Atherosclerotic heart disease of native coronary artery without angina pectoris: Secondary | ICD-10-CM | POA: Diagnosis not present

## 2022-11-14 DIAGNOSIS — I10 Essential (primary) hypertension: Secondary | ICD-10-CM | POA: Diagnosis not present

## 2022-11-14 DIAGNOSIS — I509 Heart failure, unspecified: Secondary | ICD-10-CM | POA: Diagnosis not present

## 2022-11-14 DIAGNOSIS — I4891 Unspecified atrial fibrillation: Secondary | ICD-10-CM | POA: Diagnosis not present

## 2022-11-14 DIAGNOSIS — E785 Hyperlipidemia, unspecified: Secondary | ICD-10-CM | POA: Diagnosis not present

## 2022-11-14 DIAGNOSIS — E663 Overweight: Secondary | ICD-10-CM | POA: Diagnosis not present

## 2022-11-28 DIAGNOSIS — H401231 Low-tension glaucoma, bilateral, mild stage: Secondary | ICD-10-CM | POA: Diagnosis not present

## 2022-11-28 DIAGNOSIS — Z961 Presence of intraocular lens: Secondary | ICD-10-CM | POA: Diagnosis not present

## 2022-12-06 DIAGNOSIS — H401121 Primary open-angle glaucoma, left eye, mild stage: Secondary | ICD-10-CM | POA: Diagnosis not present

## 2022-12-10 ENCOUNTER — Telehealth: Payer: Self-pay | Admitting: Cardiology

## 2022-12-10 DIAGNOSIS — I5042 Chronic combined systolic (congestive) and diastolic (congestive) heart failure: Secondary | ICD-10-CM

## 2022-12-10 DIAGNOSIS — I4891 Unspecified atrial fibrillation: Secondary | ICD-10-CM

## 2022-12-10 MED ORDER — METOPROLOL SUCCINATE ER 50 MG PO TB24
50.0000 mg | ORAL_TABLET | Freq: Every day | ORAL | 3 refills | Status: DC
Start: 2022-12-10 — End: 2023-05-03

## 2022-12-10 NOTE — Telephone Encounter (Signed)
*  STAT* If patient is at the pharmacy, call can be transferred to refill team.   1. Which medications need to be refilled? (please list name of each medication and dose if known)   levothyroxine (SYNTHROID) 75 MCG tablet    metoprolol succinate (TOPROL-XL) 50 MG 24 hr tablet    2. Which pharmacy/location (including street and city if local pharmacy) is medication to be sent to?  [facility] - [address] [place] - [address]      3. Do they need a 30 day or 90 day supply? 90 day

## 2022-12-11 DIAGNOSIS — N184 Chronic kidney disease, stage 4 (severe): Secondary | ICD-10-CM | POA: Diagnosis not present

## 2022-12-11 DIAGNOSIS — N2581 Secondary hyperparathyroidism of renal origin: Secondary | ICD-10-CM | POA: Diagnosis not present

## 2022-12-11 DIAGNOSIS — I4821 Permanent atrial fibrillation: Secondary | ICD-10-CM | POA: Diagnosis not present

## 2022-12-11 DIAGNOSIS — E039 Hypothyroidism, unspecified: Secondary | ICD-10-CM | POA: Diagnosis not present

## 2022-12-11 DIAGNOSIS — E7800 Pure hypercholesterolemia, unspecified: Secondary | ICD-10-CM | POA: Diagnosis not present

## 2022-12-11 DIAGNOSIS — R7303 Prediabetes: Secondary | ICD-10-CM | POA: Diagnosis not present

## 2022-12-11 DIAGNOSIS — I10 Essential (primary) hypertension: Secondary | ICD-10-CM | POA: Diagnosis not present

## 2022-12-11 DIAGNOSIS — D6869 Other thrombophilia: Secondary | ICD-10-CM | POA: Diagnosis not present

## 2022-12-11 DIAGNOSIS — Z8739 Personal history of other diseases of the musculoskeletal system and connective tissue: Secondary | ICD-10-CM | POA: Diagnosis not present

## 2022-12-12 ENCOUNTER — Telehealth: Payer: Self-pay | Admitting: Cardiology

## 2022-12-12 NOTE — Telephone Encounter (Signed)
Medication list from most recent office visit faxed to number on file.

## 2022-12-12 NOTE — Telephone Encounter (Signed)
Office calling to try and get a list of the pt's medications. Please advise

## 2022-12-13 DIAGNOSIS — N184 Chronic kidney disease, stage 4 (severe): Secondary | ICD-10-CM | POA: Diagnosis not present

## 2022-12-13 DIAGNOSIS — I4891 Unspecified atrial fibrillation: Secondary | ICD-10-CM | POA: Diagnosis not present

## 2022-12-13 DIAGNOSIS — D692 Other nonthrombocytopenic purpura: Secondary | ICD-10-CM | POA: Diagnosis not present

## 2022-12-31 DIAGNOSIS — I509 Heart failure, unspecified: Secondary | ICD-10-CM | POA: Diagnosis not present

## 2022-12-31 DIAGNOSIS — D631 Anemia in chronic kidney disease: Secondary | ICD-10-CM | POA: Diagnosis not present

## 2022-12-31 DIAGNOSIS — I129 Hypertensive chronic kidney disease with stage 1 through stage 4 chronic kidney disease, or unspecified chronic kidney disease: Secondary | ICD-10-CM | POA: Diagnosis not present

## 2022-12-31 DIAGNOSIS — N184 Chronic kidney disease, stage 4 (severe): Secondary | ICD-10-CM | POA: Diagnosis not present

## 2022-12-31 DIAGNOSIS — N2581 Secondary hyperparathyroidism of renal origin: Secondary | ICD-10-CM | POA: Diagnosis not present

## 2023-01-14 DIAGNOSIS — H401221 Low-tension glaucoma, left eye, mild stage: Secondary | ICD-10-CM | POA: Diagnosis not present

## 2023-01-21 ENCOUNTER — Ambulatory Visit (HOSPITAL_BASED_OUTPATIENT_CLINIC_OR_DEPARTMENT_OTHER): Payer: PPO | Admitting: Cardiology

## 2023-01-25 ENCOUNTER — Encounter (HOSPITAL_BASED_OUTPATIENT_CLINIC_OR_DEPARTMENT_OTHER): Payer: Self-pay | Admitting: Cardiology

## 2023-01-25 ENCOUNTER — Ambulatory Visit (HOSPITAL_BASED_OUTPATIENT_CLINIC_OR_DEPARTMENT_OTHER): Payer: PPO | Admitting: Cardiology

## 2023-01-25 VITALS — BP 112/60 | HR 69 | Ht 68.0 in | Wt 177.0 lb

## 2023-01-25 DIAGNOSIS — I4821 Permanent atrial fibrillation: Secondary | ICD-10-CM

## 2023-01-25 DIAGNOSIS — Z952 Presence of prosthetic heart valve: Secondary | ICD-10-CM | POA: Diagnosis not present

## 2023-01-25 DIAGNOSIS — Z9889 Other specified postprocedural states: Secondary | ICD-10-CM

## 2023-01-25 DIAGNOSIS — Z7901 Long term (current) use of anticoagulants: Secondary | ICD-10-CM

## 2023-01-25 DIAGNOSIS — I2510 Atherosclerotic heart disease of native coronary artery without angina pectoris: Secondary | ICD-10-CM

## 2023-01-25 DIAGNOSIS — N184 Chronic kidney disease, stage 4 (severe): Secondary | ICD-10-CM | POA: Diagnosis not present

## 2023-01-25 DIAGNOSIS — D6869 Other thrombophilia: Secondary | ICD-10-CM

## 2023-01-25 NOTE — Progress Notes (Signed)
Cardiology Office Note:  .   Date:  01/25/2023  ID:  [name], DOB [birth year], MRN [id number] PCP: Noberto Retort, MD  [facility] Providers Cardiologist:  Jodelle Red, MD {  History of Present Illness: .   [name] is a 89 y.o. male with a hx of CAD s/p CABG, mitral valve repair, atrial fibrillation on anticoagulation, interstitial lung disease, chronic systolic and diastolic heart failure, low flow low gradient aortic stenosis s/p TAVR who is seen in follow up today. He follows at the [facility] in [place].   Today: Since our last visit, he was admitted in May for finger swelling, concern for septic arthritis vs gout. He was initially treated with steroids and antibiotics, but felt to be gout, antibiotics discontinued and steroids resulted in improvement.  Overall doing very well. No cardiac symptoms. Tolerating medications well.  ROS: Denies chest pain, shortness of breath at rest or with normal exertion. No PND, orthopnea, LE edema or unexpected weight gain. No syncope or palpitations. ROS otherwise negative except as noted.   Studies Reviewed: Marland Kitchen    EKG:       Physical Exam:   VS:  BP 112/60 (BP Location: Left Arm, Patient Position: Sitting, Cuff Size: Normal)   Pulse 69   Ht 5\' 8"  (1.727 m)   Wt 177 lb (80.3 kg)   BMI 26.91 kg/m    Wt Readings from Last 3 Encounters:  01/25/23 177 lb (80.3 kg)  07/26/22 165 lb (74.8 kg)  07/19/22 173 lb 6.4 oz (78.7 kg)    GEN: Well nourished, well developed in no acute distress HEENT: Normal, moist mucous membranes NECK: No JVD CARDIAC: irregularly irregular rhythm, normal S1 and S2, no rubs or gallops. 1/6 systolic murmur. VASCULAR: Radial and DP pulses 2+ bilaterally. No carotid bruits RESPIRATORY:  Clear to auscultation without rales, wheezing or rhonchi  ABDOMEN: Soft, non-tender, non-distended MUSCULOSKELETAL:  Ambulates independently SKIN: Warm and dry, no edema NEUROLOGIC:  Alert and oriented x 3.  No focal neuro deficits noted. PSYCHIATRIC:  Normal affect    ASSESSMENT AND PLAN: .    Prior combined heart failure/cardiomyopathy, now with recovered EF -EF 25-30% 09/2019 -echo 05/19/20 reported as EF 60-65%. While it does not appear quite that vigorous to me, it is significantly improved from prior echo -reports doing well on torsemide 40 mg in the AM and 20 mg in the PM -continue metoprolol succinate -no room for ACEi/ARB/ARNI/MRA given history of hypotension and CKD   Atrial Fibrillation, permanent:  -drastically improved rate control since TAVR. Had been extremely difficult to control rate prior.  -tolerating metoprolol succinate 50 mg daily, increased dose leads to worsening hypotension -not controlled on amiodarone in the past (and with liver/lung pathology, would avoid), no diltiazem given prior low EF, avoid digoxin given CKD -tolerating apixaban 2.5 mg BID (reduced for age, Cr). 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 Thedore Mins at [facility]   CAD: remote h/o CABG. -no chest pain -tolerating atorvastatin, no aspirin given apixaban for afib   Hyperlipidemia: -Goal LDL <70 -[facility] -continue at[facility]atin   Mitral Regurgitation: h/o MV repair. Mild-moderate on TAVR TEE   Aortic Stenosis, severe, s/p TAVR:  -has endocarditis prophylaxis ordered  Dispo: 6 mos or sooner as needed  Signed, Jodelle Red, MD   Jodelle Red,  MD, PhD, [facility]  [facility]  [place]  [facility] at [facility] at [facility] [address] [place], [place] [address] [phone]

## 2023-01-25 NOTE — Patient Instructions (Signed)

## 2023-04-22 DIAGNOSIS — N184 Chronic kidney disease, stage 4 (severe): Secondary | ICD-10-CM | POA: Diagnosis not present

## 2023-05-03 ENCOUNTER — Emergency Department (HOSPITAL_COMMUNITY): Payer: PPO

## 2023-05-03 ENCOUNTER — Other Ambulatory Visit: Payer: Self-pay

## 2023-05-03 ENCOUNTER — Observation Stay (HOSPITAL_COMMUNITY)
Admission: EM | Admit: 2023-05-03 | Discharge: 2023-05-04 | Disposition: A | Discharge status: Home / Self Care | Payer: PPO | Attending: Internal Medicine | Admitting: Internal Medicine

## 2023-05-03 ENCOUNTER — Encounter (HOSPITAL_COMMUNITY): Payer: Self-pay

## 2023-05-03 DIAGNOSIS — Z79899 Other long term (current) drug therapy: Secondary | ICD-10-CM | POA: Insufficient documentation

## 2023-05-03 DIAGNOSIS — I5042 Chronic combined systolic (congestive) and diastolic (congestive) heart failure: Secondary | ICD-10-CM | POA: Insufficient documentation

## 2023-05-03 DIAGNOSIS — Z951 Presence of aortocoronary bypass graft: Secondary | ICD-10-CM | POA: Insufficient documentation

## 2023-05-03 DIAGNOSIS — I639 Cerebral infarction, unspecified: Secondary | ICD-10-CM | POA: Diagnosis not present

## 2023-05-03 DIAGNOSIS — I2510 Atherosclerotic heart disease of native coronary artery without angina pectoris: Secondary | ICD-10-CM | POA: Diagnosis not present

## 2023-05-03 DIAGNOSIS — I5032 Chronic diastolic (congestive) heart failure: Secondary | ICD-10-CM | POA: Diagnosis present

## 2023-05-03 DIAGNOSIS — F444 Conversion disorder with motor symptom or deficit: Secondary | ICD-10-CM

## 2023-05-03 DIAGNOSIS — I4821 Permanent atrial fibrillation: Secondary | ICD-10-CM | POA: Diagnosis present

## 2023-05-03 DIAGNOSIS — E039 Hypothyroidism, unspecified: Secondary | ICD-10-CM | POA: Diagnosis present

## 2023-05-03 DIAGNOSIS — N184 Chronic kidney disease, stage 4 (severe): Secondary | ICD-10-CM | POA: Diagnosis not present

## 2023-05-03 DIAGNOSIS — Z7901 Long term (current) use of anticoagulants: Secondary | ICD-10-CM | POA: Diagnosis not present

## 2023-05-03 DIAGNOSIS — I63331 Cerebral infarction due to thrombosis of right posterior cerebral artery: Principal | ICD-10-CM | POA: Insufficient documentation

## 2023-05-03 DIAGNOSIS — Z952 Presence of prosthetic heart valve: Secondary | ICD-10-CM | POA: Diagnosis not present

## 2023-05-03 DIAGNOSIS — R2689 Other abnormalities of gait and mobility: Secondary | ICD-10-CM | POA: Diagnosis not present

## 2023-05-03 DIAGNOSIS — D696 Thrombocytopenia, unspecified: Secondary | ICD-10-CM | POA: Diagnosis present

## 2023-05-03 DIAGNOSIS — I130 Hypertensive heart and chronic kidney disease with heart failure and stage 1 through stage 4 chronic kidney disease, or unspecified chronic kidney disease: Secondary | ICD-10-CM | POA: Insufficient documentation

## 2023-05-03 DIAGNOSIS — Z87891 Personal history of nicotine dependence: Secondary | ICD-10-CM | POA: Insufficient documentation

## 2023-05-03 DIAGNOSIS — R531 Weakness: Secondary | ICD-10-CM | POA: Diagnosis present

## 2023-05-03 LAB — COMPREHENSIVE METABOLIC PANEL
ALT: 28 U/L (ref 0–44)
AST: 32 U/L (ref 15–41)
Albumin: 4.1 g/dL (ref 3.5–5.0)
Alkaline Phosphatase: 53 U/L (ref 38–126)
Anion gap: 14 (ref 5–15)
BUN: 36 mg/dL — ABNORMAL HIGH (ref 8–23)
CO2: 27 mmol/L (ref 22–32)
Calcium: 9.4 mg/dL (ref 8.9–10.3)
Chloride: 98 mmol/L (ref 98–111)
Creatinine, Ser: 2.52 mg/dL — ABNORMAL HIGH (ref 0.61–1.24)
GFR, Estimated: 24 mL/min — ABNORMAL LOW (ref >60)
Glucose, Bld: 110 mg/dL — ABNORMAL HIGH (ref 70–99)
Potassium: 3.4 mmol/L — ABNORMAL LOW (ref 3.5–5.1)
Sodium: 139 mmol/L (ref 135–145)
Total Bilirubin: 0.8 mg/dL (ref 0.0–1.2)
Total Protein: 6.8 g/dL (ref 6.5–8.1)

## 2023-05-03 LAB — DIFFERENTIAL
Abs Immature Granulocytes: 0.02 10*3/uL (ref 0.00–0.07)
Basophils Absolute: 0.1 10*3/uL (ref 0.0–0.1)
Basophils Relative: 1 %
Eosinophils Absolute: 0.2 10*3/uL (ref 0.0–0.5)
Eosinophils Relative: 3 %
Immature Granulocytes: 0 %
Lymphocytes Relative: 14 %
Lymphs Abs: 1 10*3/uL (ref 0.7–4.0)
Monocytes Absolute: 0.6 10*3/uL (ref 0.1–1.0)
Monocytes Relative: 8 %
Neutro Abs: 5.2 10*3/uL (ref 1.7–7.7)
Neutrophils Relative %: 74 %

## 2023-05-03 LAB — CBC
HCT: 46.2 % (ref 39.0–52.0)
Hemoglobin: 15.2 g/dL (ref 13.0–17.0)
MCH: 29.8 pg (ref 26.0–34.0)
MCHC: 32.9 g/dL (ref 30.0–36.0)
MCV: 90.6 fL (ref 80.0–100.0)
Platelets: 102 10*3/uL — ABNORMAL LOW (ref 150–400)
RBC: 5.1 MIL/uL (ref 4.22–5.81)
RDW: 14.5 % (ref 11.5–15.5)
WBC: 7 10*3/uL (ref 4.0–10.5)
nRBC: 0 % (ref 0.0–0.2)

## 2023-05-03 LAB — PROTIME-INR
INR: 1.2 (ref 0.8–1.2)
Prothrombin Time: 14.9 s (ref 11.4–15.2)

## 2023-05-03 LAB — I-STAT CHEM 8, ED
BUN: 39 mg/dL — ABNORMAL HIGH (ref 8–23)
Calcium, Ion: 1.09 mmol/L — ABNORMAL LOW (ref 1.15–1.40)
Chloride: 100 mmol/L (ref 98–111)
Creatinine, Ser: 2.7 mg/dL — ABNORMAL HIGH (ref 0.61–1.24)
Glucose, Bld: 106 mg/dL — ABNORMAL HIGH (ref 70–99)
HCT: 46 % (ref 39.0–52.0)
Hemoglobin: 15.6 g/dL (ref 13.0–17.0)
Potassium: 3.4 mmol/L — ABNORMAL LOW (ref 3.5–5.1)
Sodium: 140 mmol/L (ref 135–145)
TCO2: 28 mmol/L (ref 22–32)

## 2023-05-03 LAB — CBG MONITORING, ED: Glucose-Capillary: 101 mg/dL — ABNORMAL HIGH (ref 70–99)

## 2023-05-03 LAB — APTT: aPTT: 30 s (ref 24–36)

## 2023-05-03 LAB — ETHANOL: Alcohol, Ethyl (B): <10 mg/dL (ref <10)

## 2023-05-03 MED ORDER — SODIUM CHLORIDE 0.9 % IV BOLUS
500.0000 mL | Freq: Once | INTRAVENOUS | Status: AC
  Administered 2023-05-03: 500 mL via INTRAVENOUS

## 2023-05-03 MED ORDER — TORSEMIDE 20 MG PO TABS
40.0000 mg | ORAL_TABLET | Freq: Every day | ORAL | Status: DC
Start: 2023-05-04 — End: 2023-05-04
  Administered 2023-05-04: 40 mg via ORAL
  Filled 2023-05-03: qty 2

## 2023-05-03 MED ORDER — STROKE: EARLY STAGES OF RECOVERY BOOK
Freq: Once | Status: AC
  Filled 2023-05-03: qty 1

## 2023-05-03 MED ORDER — IOHEXOL 350 MG/ML SOLN
75.0000 mL | Freq: Once | INTRAVENOUS | Status: AC | PRN
  Administered 2023-05-03: 75 mL via INTRAVENOUS

## 2023-05-03 MED ORDER — TORSEMIDE 20 MG PO TABS: 20.0000 mg | ORAL_TABLET | Freq: Every day | ORAL | Status: DC

## 2023-05-03 MED ORDER — METOPROLOL TARTRATE 50 MG PO TABS
50.0000 mg | ORAL_TABLET | Freq: Every day | ORAL | Status: DC
  Administered 2023-05-04: 50 mg via ORAL
  Filled 2023-05-03: qty 1

## 2023-05-03 MED ORDER — APIXABAN 2.5 MG PO TABS
2.5000 mg | ORAL_TABLET | Freq: Two times a day (BID) | ORAL | Status: DC
Start: 2023-05-03 — End: 2023-05-04
  Administered 2023-05-03 – 2023-05-04 (×2): 2.5 mg via ORAL
  Filled 2023-05-03 (×2): qty 1

## 2023-05-03 MED ORDER — ACETAMINOPHEN 650 MG RE SUPP: 650.0000 mg | RECTAL | Status: DC | PRN

## 2023-05-03 MED ORDER — SENNOSIDES-DOCUSATE SODIUM 8.6-50 MG PO TABS: 1.0000 | ORAL_TABLET | Freq: Every evening | ORAL | Status: DC | PRN

## 2023-05-03 MED ORDER — SODIUM CHLORIDE 0.9% FLUSH: 3.0000 mL | Freq: Once | INTRAVENOUS | Status: DC

## 2023-05-03 MED ORDER — ONDANSETRON HCL 4 MG/2ML IJ SOLN: 4.0000 mg | Freq: Four times a day (QID) | INTRAMUSCULAR | Status: DC | PRN

## 2023-05-03 MED ORDER — POTASSIUM CHLORIDE CRYS ER 20 MEQ PO TBCR
20.0000 meq | EXTENDED_RELEASE_TABLET | Freq: Once | ORAL | Status: AC
  Administered 2023-05-03: 20 meq via ORAL
  Filled 2023-05-03: qty 1

## 2023-05-03 MED ORDER — ACETAMINOPHEN 325 MG PO TABS: 650.0000 mg | ORAL_TABLET | ORAL | Status: DC | PRN

## 2023-05-03 MED ORDER — ACETAMINOPHEN 160 MG/5ML PO SOLN: 650.0000 mg | ORAL | Status: DC | PRN

## 2023-05-03 MED ORDER — LEVOTHYROXINE SODIUM 75 MCG PO TABS
75.0000 ug | ORAL_TABLET | Freq: Every day | ORAL | Status: DC
Start: 2023-05-04 — End: 2023-05-04
  Administered 2023-05-04: 75 ug via ORAL
  Filled 2023-05-03: qty 1

## 2023-05-03 MED ORDER — ATORVASTATIN CALCIUM 10 MG PO TABS
20.0000 mg | ORAL_TABLET | ORAL | Status: DC
  Administered 2023-05-03: 20 mg via ORAL
  Filled 2023-05-03: qty 2

## 2023-05-03 NOTE — H&P (Signed)
History and Physical    [name]:[id number] DOB: [birth year] DOA: 05/03/2023  PCP: Noberto Retort, MD  Patient coming from: Home  I have personally briefly reviewed patient's old medical records in [facility]  Chief Complaint: Left-sided weakness  HPI: [name] is a 89 y.o. male with medical history significant for CAD s/p CABG, permanent A-fib on Eliquis, HFpEF, s/p TAVR and mitral valve repair, CKD stage IV, hypothyroidism, and chronic thrombocytopenia who presented to the ED for evaluation of left-sided weakness.  Patient states that after going out to eat lunch with family he was trying to get out of the car when they return home however he was able to because his left arm and leg felt weak and numb.  Last known well was around 3 PM.  He did not have any vision changes, headache, or reported change in speech.  Denies chest pain or dyspnea.  He has been on Eliquis 2.5 mg twice daily and reports adherence without missed doses.  ED Course  Labs/Imaging on admission: I have personally reviewed following labs and imaging studies.  Initial vitals showed BP 132/72, pulse 76, RR 21, temp 97.4 F, SpO2 100% on room air.  Labs show WBC 7.0, hemoglobin 15.2, platelets 102,000, sodium 139, potassium 3.4, bicarb 27, BUN 36, creatinine 2.52, serum glucose 110, serum ethanol <10.  CT head negative for acute intracranial abnormality.  CTA neck showed patent common carotid, internal carotid, vertebral arteries.  50% stenosis proximal right ICA noted.  CTA head negative for proximal intracranial LVO or high-grade proximal arterial stenosis.  Intracranial atherosclerotic disease with multifocal stenosis noted, most notably moderate/severe stenosis within paraclinoid right ICA.  MRI brain without contrast showed multiple small and ill-defined acute or early subacute infarcts in the high right posterior frontal perirolandic area.  MRI C-spine without contrast showed  multilevel cervical spondylosis, most pronounced at C3-4 where there is moderate canal stenosis and moderate bilateral foraminal stenosis.  Mild-moderate canal stenosis at C4-5 with moderate severe left and mild to moderate right foraminal stenosis.  Mild canal stenosis and moderate right foraminal stenosis at C5-6.  Patient was given 500 mL normal saline.  Neurology were consulted and recommended medical admission for stroke workup.  The hospitalist service was consulted to admit.  Home  Review of Systems: All systems reviewed and are negative except as documented in history of present illness above.   Past Medical History:  Diagnosis Date   Adenomatous colon polyp    CAD (coronary artery disease)    a. S/P CABG x 1 @ time of MV annuloplasty;  b. 02/2010 ETT: neg for ischemia.   Carotid artery stenosis    1-39% by dopplers 10/2016   Chronic combined systolic (congestive) and diastolic (congestive) heart failure (HCC)    CKD (chronic kidney disease), stage III (HCC)    Current use of long term anticoagulation    a. Coumadin in setting of afib.   Diverticulosis    Dyslipidemia    Dysrhythmia    A-fib   Essential hypertension    H/O mitral valve repair 09/16/2002   Complex valvuloplasty including quadrangular resection of posterior leaflet with artificial Gore-tex neochords and 28 mm Seguin ring annuloplasty - Dr Tyrone Sage   Hemorrhoids    Hyperlipidemia    Hypertension    Permanent atrial fibrillation (HCC)    a. CHA2DS2VASc = 6 -->chronic coumadin;  b. Prev on amio-->d/c 2/2 hypothyroidism. >> resumed 5/16. c. Notes from 2017 indicate interstitial lung disease by  pulm appt, question amio toxicity, also increased liver attenuation. Rate control pursued and amiodarone discontinued.   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   s/p TAVR w/ a 29 mm Edwards Sapien 3 THV via the TF approach by Drs Excell Seltzer and Cornelius Moras   Sleep apnea 12/2018    per patient's spouse wears BIPAP "every other night or so"   Thrombocytopenia (HCC)    TIA (transient ischemic attack)     Past Surgical History:  Procedure Laterality Date   CATARACT EXTRACTION W/ INTRAOCULAR LENS  IMPLANT, BILATERAL     per patient about 4 years ago   CORONARY ARTERY BYPASS GRAFT  2004   ELECTROMAGNETIC NAVIGATION BROCHOSCOPY  10/14/2019   Procedure: NAVIGATION BRONCHOSCOPY;  Surgeon: Josephine Igo, DO;  Location: [facility] ENDOSCOPY;  Service: Pulmonary;;   INTERCOSTAL NERVE BLOCK Right 10/14/2019   Procedure: INTERCOSTAL NERVE BLOCK;  Surgeon: Delight Ovens, MD;  Location: [facility] OR;  Service: Thoracic;  Laterality: Right;   INTRAOPERATIVE TRANSTHORACIC ECHOCARDIOGRAM N/A 05/26/2019   Procedure: INTRAOPERATIVE TRANSTHORACIC ECHOCARDIOGRAM;  Surgeon: Tonny Bollman, MD;  Location: [facility] OR;  Service: Open Heart Surgery;  Laterality: N/A;   MITRAL VALVE REPAIR     RIGHT/LEFT HEART CATH AND CORONARY/GRAFT ANGIOGRAPHY N/A 04/28/2019   Procedure: RIGHT/LEFT HEART CATH AND CORONARY/GRAFT ANGIOGRAPHY;  Surgeon: Tonny Bollman, MD;  Location: [facility] INVASIVE CV LAB;  Service: Cardiovascular;  Laterality: N/A;   SUBMUCOSAL TATTOO INJECTION  10/14/2019   Procedure: ENDOBRONCHIAL DYE INJECTION;  Surgeon: Josephine Igo, DO;  Location: [facility] ENDOSCOPY;  Service: Pulmonary;;   TEE WITHOUT CARDIOVERSION N/A 05/20/2019   Procedure: TRANSESOPHAGEAL ECHOCARDIOGRAM (TEE);  Surgeon: Little Ishikawa, MD;  Location: [facility] ENDOSCOPY;  Service: Cardiovascular;  Laterality: N/A;   TRANSCATHETER AORTIC VALVE REPLACEMENT, TRANSFEMORAL N/A 05/26/2019   Procedure: TRANSCATHETER AORTIC VALVE REPLACEMENT, TRANSFEMORAL;  Surgeon: Tonny Bollman, MD;  Location: [facility] OR;  Service: Open Heart Surgery;  Laterality: N/A;   VIDEO BRONCHOSCOPY WITH ENDOBRONCHIAL NAVIGATION N/A 10/14/2019   Procedure: VIDEO BRONCHOSCOPY;  Surgeon: Josephine Igo, DO;  Location: [facility] ENDOSCOPY;  Service: Pulmonary;  Laterality: N/A;     Social History:  reports that he quit smoking about 68 years ago. His smoking use included cigarettes. He started smoking about 70 years ago. He has a 0.2 pack-year smoking history. He has never used smokeless tobacco. He reports that he does not drink alcohol and does not use drugs.  Allergies  Allergen Reactions   Crestor [Rosuvastatin] Other (See Comments)    myalgia   Asa [Aspirin] Other (See Comments)    Bleeding- has internal hemorrhoids   Flomax [Tamsulosin] Palpitations and Other (See Comments)    Made his heart go out of rhythm   Lipitor [Atorvastatin]     "loss of muscle control"   Mirtazapine     "Tremor"   Pirfenidone     Weight loss, loss of appetite.   Tramadol Nausea And Vomiting    Family History  Problem Relation Age of Onset   Stroke Mother        Deceased   Heart disease Father        Deceased   Heart failure Brother        Deceased   Stroke Brother      Prior to Admission medications   Medication Sig Start Date End Date Taking? Authorizing Provider  apixaban (ELIQUIS) 5 MG TABS tablet Take 2.5 mg by mouth 2 (two) times  daily. 02/13/22   [provider]  atorvastatin (LIPITOR) 20 MG tablet Take 20 mg by mouth 3 (three) times a week. TAKE 20 MG ON MONDAY, WEDNESDAY, AND FRIDAY NIGHTS.    [provider]  levothyroxine (SYNTHROID) 75 MCG tablet Take 75 mcg by mouth daily before breakfast.     [provider]  lidocaine 4 % Cut patch in half and apply to painful areas 1 hour before bed 06/08/22   Cheron Schaumann K, PA-C  metoprolol succinate (TOPROL-XL) 50 MG 24 hr tablet Take 1 tablet (50 mg total) by mouth daily. Take with or immediately following a meal. 12/10/22   Jodelle Red, MD  Polyethyl Glycol-Propyl Glycol (SYSTANE OP) Place 1 drop into both eyes 3 (three) times daily as needed (dry/irritated eyes.).     [provider]  torsemide (DEMADEX) 20 MG tablet Take 20-40 mg by mouth See admin instructions. Take  2 tablets (40 mg) by mouth in the morning & take 1 tablet (20 mg) by mouth at night.    [provider]  vitamin B-12 (CYANOCOBALAMIN) 1000 MCG tablet Take 1,000 mcg by mouth daily.     [provider]    Physical Exam: Vitals:   05/03/23 2045 05/03/23 2100 05/03/23 2126 05/03/23 2211  BP: 132/74 (!) 139/90  (!) 149/88  Pulse: 88 83  96  Resp: (!) 23 16  18   Temp:   (!) 97.3 F (36.3 C) 98.9 F (37.2 C)  TempSrc:   Temporal Oral  SpO2: 98% 100%  100%  Weight:    80.1 kg  Height:    5' 7.99" (1.727 m)   Constitutional: NAD, calm, comfortable Eyes: EOMI, lids and conjunctivae normal ENMT: Hard of hearing.  Mucous membranes are moist. Posterior pharynx clear of any exudate or lesions.Normal dentition.  Neck: normal, supple, no masses. Respiratory: clear to auscultation bilaterally, no wheezing, no crackles. Normal respiratory effort. No accessory muscle use.  Cardiovascular: Irregularly irregular, no murmurs / rubs / gallops. No extremity edema. 2+ pedal pulses. Abdomen: no tenderness, no masses palpated.  Musculoskeletal: no clubbing / cyanosis. No joint deformity upper and lower extremities. Good ROM, no contractures. Normal muscle tone.  Skin: no rashes, lesions, ulcers. No induration Neurologic: CN 2-12 grossly intact. Sensation intact. Strength 5/5 RUE and RLE, 4/5 LUE and LLE Psychiatric: Normal judgment and insight. Alert and oriented x 3. Normal mood.   EKG: Personally reviewed. Atrial fibrillation, rate 82, PVC present.  Rate is slower when compared to prior.  Assessment/Plan Principal Problem:   Acute CVA (cerebrovascular accident) (HCC) Active Problems:   Permanent atrial fibrillation   Coronary artery disease involving native coronary artery of native heart without angina pectoris   Thrombocytopenia (HCC)   Hypothyroidism   CKD (chronic kidney disease), stage IV (HCC)   Chronic heart failure with preserved ejection fraction (HFpEF) (HCC)   [name] is a 89 y.o. male with medical history significant for CAD s/p CABG, permanent A-fib on Eliquis, HFpEF, s/p TAVR and mitral valve repair, CKD stage IV, hypothyroidism, and chronic thrombocytopenia who presented with left-sided weakness and admitted for acute to subacute CVA.  Assessment and Plan: Acute to subacute infarcts in the right posterior frontal perirolandic area: Seen on MRI brain.  Patient presenting with left upper and lower extremity weakness.  CTA head/neck negative for LVO.  Has been on renal dosed Eliquis for A-fib without any missed doses. -Neurology following -Continue Eliquis 2.5 mg BID, adjust as per neurology -Obtain echocardiogram -PT/OT/SLP  eval -Keep on telemetry, continue neurochecks -Check A1c and lipid panel  Permanent atrial fibrillation: Continue Lopressor 50 mg daily and Eliquis.  Chronic HFpEF: Previous HFrEF, EF recovered to 60-65%.  Euvolemic on admission. -Continue Lopressor 50 mg daily -Continue torsemide 40 mg a.m. and 20 mg p.m. -Update echocardiogram as above -Not on ACE/ARB/ARNI/MRA given history of CKD  CKD stage IV: Renal function relatively stable.  Monitor.  CAD s/p CABG S/p TAVR S/p mitral valve repair: Stable, denies chest pain.  Continue atorvastatin.  Not on aspirin since he is on Eliquis.  Chronic thrombocytopenia: Stable without obvious bleeding.  Hypothyroidism: Continue Synthroid.   DVT prophylaxis: apixaban (ELIQUIS) tablet 2.5 mg Start: 05/03/23 2345 apixaban (ELIQUIS) tablet 2.5 mg   Code Status: Full code, confirmed with patient on admission Family Communication: Discussed with patient, he has discussed with family Disposition Plan: From home and likely discharge to home pending clinical progress Consults called: Neurology Severity of Illness: The appropriate patient status for this patient is OBSERVATION. Observation status is judged to be reasonable and necessary in order to provide the required intensity of  service to ensure the patient's safety. The patient's presenting symptoms, physical exam findings, and initial radiographic and laboratory data in the context of their medical condition is felt to place them at decreased risk for further clinical deterioration. Furthermore, it is anticipated that the patient will be medically stable for discharge from the hospital within 2 midnights of admission.   Darreld Mclean MD [facility]  If 7PM-7AM, please contact night-coverage [email or web address]  05/03/2023, 10:53 PM

## 2023-05-03 NOTE — Plan of Care (Signed)
  Problem: Education: Goal: Knowledge of General Education information will improve Description: Including pain rating scale, medication(s)/side effects and non-pharmacologic comfort measures Outcome: Not Progressing   Problem: Clinical Measurements: Goal: Diagnostic test results will improve Outcome: Not Progressing   Problem: Clinical Measurements: Goal: Cardiovascular complication will be avoided Outcome: Not Progressing   Problem: Activity: Goal: Risk for activity intolerance will decrease Outcome: Not Progressing   Problem: Safety: Goal: Ability to remain free from injury will improve Outcome: Not Progressing   Problem: Ischemic Stroke/TIA Tissue Perfusion: Goal: Complications of ischemic stroke/TIA will be minimized Outcome: Not Progressing   Problem: Coping: Goal: Will verbalize positive feelings about self Outcome: Not Progressing Goal: Will identify appropriate support needs Outcome: Not Progressing   Problem: Self-Care: Goal: Ability to participate in self-care as condition permits will improve Outcome: Not Progressing Goal: Verbalization of feelings and concerns over difficulty with self-care will improve Outcome: Not Progressing Goal: Ability to communicate needs accurately will improve Outcome: Not Progressing

## 2023-05-03 NOTE — ED Provider Notes (Signed)
EMERGENCY DEPARTMENT AT [facility] Provider Note   CSN: [id number] Arrival date & time: 05/03/23  1617  An emergency department physician performed an initial assessment on this suspected stroke patient at 1615.  History  Chief Complaint  Patient presents with   Code Stroke    [name] is a 89 y.o. male.  HPI 89 year old male history of CAD status post CABG, prior TAVR, chronic anticoagulation with Eliquis, CKD, hypertension presenting for left-sided weakness.  Reportedly last known well was around 3 PM.  When he returned to get out of the car at home he noted acute onset left-sided weakness.  Reportedly has had this before with prior TIA.  No falls or trauma or headache.  No vision changes.  No chest pain or shortness of breath.     Home Medications Prior to Admission medications   Medication Sig Start Date End Date Taking? Authorizing Provider  apixaban (ELIQUIS) 5 MG TABS tablet Take 2.5 mg by mouth 2 (two) times daily. 02/13/22   [provider]  atorvastatin (LIPITOR) 20 MG tablet Take 20 mg by mouth 3 (three) times a week. TAKE 20 MG ON MONDAY, WEDNESDAY, AND FRIDAY NIGHTS.    [provider]  Cholecalciferol 50 MCG (2000 UT) TABS Take 1 tablet by mouth daily.    [provider]  cyanocobalamin (VITAMIN B12) 500 MCG tablet Take 500 mcg by mouth daily.    [provider]  DOCUSATE SODIUM PO Take 1 tablet by mouth as needed (constipation).    [provider]  hydrocortisone cream 0.5 % Apply 1 Application topically 2 (two) times daily.    [provider]  levothyroxine (SYNTHROID) 75 MCG tablet Take 75 mcg by mouth daily before breakfast.     [provider]  lidocaine 4 % Cut patch in half and apply to painful areas 1 hour before bed 06/08/22   Cheron Schaumann K, PA-C  metoprolol tartrate (LOPRESSOR) 100 MG tablet Take 50 mg by mouth daily.    [provider]  Polyethyl  Glycol-Propyl Glycol (SYSTANE OP) Place 1 drop into both eyes 3 (three) times daily as needed (dry/irritated eyes.).     [provider]  torsemide (DEMADEX) 20 MG tablet Take 20-40 mg by mouth See admin instructions. Take 2 tablets (40 mg) by mouth in the morning & take 1 tablet (20 mg) by mouth at night.    [provider]      Allergies    Crestor [rosuvastatin], Asa [aspirin], Flomax [tamsulosin], Lipitor [atorvastatin], Mirtazapine, Pirfenidone, and Tramadol    Review of Systems   Review of Systems Review of systems completed and notable as per HPI.  ROS otherwise negative.  Physical Exam Updated Vital Signs BP (!) 149/88 (BP Location: Left Arm)   Pulse 96   Temp 98.9 F (37.2 C) (Oral)   Resp 18   Ht 5' 7.99" (1.727 m)   Wt 80.1 kg   SpO2 100%   BMI 26.86 kg/m  Physical Exam Vitals and nursing note reviewed.  Constitutional:      General: He is not in acute distress.    Appearance: He is well-developed.  HENT:     Head: Normocephalic and atraumatic.     Nose: Nose normal.     Mouth/Throat:     Mouth: Mucous membranes are moist.     Pharynx: Oropharynx is clear.  Eyes:     Extraocular Movements: Extraocular movements intact.     Conjunctiva/sclera: Conjunctivae normal.  Pupils: Pupils are equal, round, and reactive to light.  Cardiovascular:     Rate and Rhythm: Normal rate and regular rhythm.     Pulses: Normal pulses.     Heart sounds: Normal heart sounds. No murmur heard. Pulmonary:     Effort: Pulmonary effort is normal. No respiratory distress.     Breath sounds: Normal breath sounds.  Abdominal:     Palpations: Abdomen is soft.     Tenderness: There is no abdominal tenderness. There is no guarding or rebound.  Musculoskeletal:        General: No swelling.     Cervical back: Normal range of motion and neck supple.     Right lower leg: No edema.     Left lower leg: No edema.  Skin:    General: Skin is warm and dry.     Capillary  Refill: Capillary refill takes less than 2 seconds.  Neurological:     Mental Status: He is alert and oriented to person, place, and time.     Cranial Nerves: No cranial nerve deficit.     Comments: Patient awake and alert.  Oriented.  Normal speech.  He has 3+ out of 5 strength in the left upper extremity.  4 out of 5 strength in the left lower extremity.  He is decreased sensation left lower extremity but otherwise normal sensation.  Normal strength in the right upper and lower extremity.  No cranial nerve deficits.  No facial asymmetry.  Psychiatric:        Mood and Affect: Mood normal.     ED Results / Procedures / Treatments   Labs (all labs ordered are listed, but only abnormal results are displayed) Labs Reviewed  CBC - Abnormal; Notable for the following components:      Result Value   Platelets 102 (*)    All other components within normal limits  COMPREHENSIVE METABOLIC PANEL - Abnormal; Notable for the following components:   Potassium 3.4 (*)    Glucose, Bld 110 (*)    BUN 36 (*)    Creatinine, Ser 2.52 (*)    GFR, Estimated 24 (*)    All other components within normal limits  I-STAT CHEM 8, ED - Abnormal; Notable for the following components:   Potassium 3.4 (*)    BUN 39 (*)    Creatinine, Ser 2.70 (*)    Glucose, Bld 106 (*)    Calcium, Ion 1.09 (*)    All other components within normal limits  CBG MONITORING, ED - Abnormal; Notable for the following components:   Glucose-Capillary 101 (*)    All other components within normal limits  PROTIME-INR  APTT  DIFFERENTIAL  ETHANOL  LIPID PANEL  HEMOGLOBIN A1C  CBC  BASIC METABOLIC PANEL    EKG EKG Interpretation Date/Time:  Friday May 03 2023 17:23:36 EST Ventricular Rate:  82 PR Interval:    QRS Duration:  128 QT Interval:  415 QTC Calculation: 458 R Axis:   117  Text Interpretation: Atrial fibrillation Ventricular premature complex Nonspecific intraventricular conduction delay Confirmed by Fulton Reek [phone]) on 05/03/2023 6:15:29 PM  Radiology MR BRAIN WO CONTRAST Result Date: 05/03/2023 CLINICAL DATA:  Neuro deficit, acute, stroke suspected. EXAM: MRI HEAD WITHOUT CONTRAST TECHNIQUE: Multiplanar, multiecho pulse sequences of the brain and surrounding structures were obtained without intravenous contrast. COMPARISON:  CT head from today. FINDINGS: Brain: Multiple small and ill-defined acute or early subacute infarcts in the high right posterior frontal perirolandic area (see  series 5, images 82 through 88). No acute hemorrhage, hydrocephalus, extra-axial collection or mass lesion. Cerebral atrophy with prominence of the extra-axial spaces. Small remote left cerebellar infarct. Vascular: Major arterial flow voids are maintained Skull and upper cervical spine: Normal marrow signal. Sinuses/Orbits: Mostly clear sinuses.  No acute orbital findings. IMPRESSION: 1. Multiple small and ill-defined acute or early subacute infarcts in the high right posterior frontal perirolandic area. 2.  Cerebral Atrophy (ICD10-G31.9). Electronically Signed   By: Feliberto Harts M.D.   On: 05/03/2023 20:15   MR CERVICAL SPINE WO CONTRAST Result Date: 05/03/2023 CLINICAL DATA:  Myelopathy, acute, cervical spine. Bilateral arm weakness EXAM: MRI CERVICAL SPINE WITHOUT CONTRAST TECHNIQUE: Multiplanar, multisequence MR imaging of the cervical spine was performed. No intravenous contrast was administered. COMPARISON:  Same day CTA FINDINGS: Alignment: Straightening of the cervical lordosis. No significant listhesis. Vertebrae: No fracture, evidence of discitis, or bone lesion. Cord: Normal signal and morphology. Posterior Fossa, vertebral arteries, paraspinal tissues: Negative. Disc levels: C2-C3: Mid disc protrusion. Moderate right facet arthropathy. No foraminal or canal stenosis. C3-C4: Disc osteophyte complex with bilateral facet and uncovertebral arthropathy. Moderate canal stenosis with moderate bilateral foraminal  stenosis. C4-C5: Disc osteophyte complex with bilateral facet and uncovertebral arthropathy. Mild-moderate canal stenosis with moderate-severe left and mild-moderate right foraminal stenosis. C5-C6: Disc osteophyte complex with bilateral facet and uncovertebral arthropathy. Mild canal stenosis with moderate right foraminal stenosis. C6-C7: Mild disc bulge and facet hypertrophy without foraminal or canal stenosis. C7-T1: Disc bulge and endplate spurring without foraminal or canal stenosis. IMPRESSION: 1. Multilevel cervical spondylosis, most pronounced at the C3-4 level where there is moderate canal stenosis and moderate bilateral foraminal stenosis. 2. Mild-moderate canal stenosis at C4-5 with moderate-severe left and mild-moderate right foraminal stenosis. 3. Mild canal stenosis and moderate right foraminal stenosis at C5-6. Electronically Signed   By: Duanne Guess D.O.   On: 05/03/2023 20:11   CT ANGIO HEAD NECK W WO CM (CODE STROKE) Result Date: 05/03/2023 CLINICAL DATA:  History: Neuro deficit, acute, stroke suspected EXAM: CT ANGIOGRAPHY HEAD AND NECK WITH AND WITHOUT CONTRAST TECHNIQUE: Multidetector CT imaging of the head and neck was performed using the standard protocol during bolus administration of intravenous contrast. Multiplanar CT image reconstructions and MIPs were obtained to evaluate the vascular anatomy. Carotid stenosis measurements (when applicable) are obtained utilizing NASCET criteria, using the distal internal carotid diameter as the denominator. RADIATION DOSE REDUCTION: This exam was performed according to the departmental dose-optimization program which includes automated exposure control, adjustment of the mA and/or kV according to patient size and/or use of iterative reconstruction technique. CONTRAST:  Administered contrast not known at this time. COMPARISON:  Noncontrast head CT performed earlier today 05/03/2023. FINDINGS: CTA NECK FINDINGS Aortic arch: Standard aortic  branching. Atherosclerotic plaque within the visualized thoracic aorta and proximal major branch vessels of the neck. Streak/beam hardening artifact arising from a dense contrast bolus partially obscures the right subclavian artery. Within this limitation, there is no appreciable hemodynamically significant innominate or proximal subclavian artery stenosis. Right carotid system: CCA and ICA patent within the neck. Atherosclerotic plaque within the distal CCA, about the carotid bifurcation and within the proximal ICA. Atherosclerotic plaque within the proximal ICA results in 50% stenosis. Left carotid system: CCA and ICA patent within the neck without hemodynamically significant stenosis (50% or greater). Atherosclerotic plaque at the CCA origin, about the carotid bifurcation and within the proximal ICA. Most notably, moderate atherosclerotic plaque is present about the carotid bifurcation and within the proximal  ICA. Vertebral arteries: Patent within the neck. Venous reflux of contrast precludes accurate assessment of the right vertebral artery V1 segment. Elsewhere, there is atherosclerotic plaque within the right vertebral artery at the V3/V4 junction with mild stenosis. Atherosclerotic plaque at the left vertebral artery origin with mild/moderate stenosis at this site. Skeleton: Prior median sternotomy. Spondylosis of the cervical and visualized upper thoracic levels. Other neck: No neck mass or cervical lymphadenopathy. Upper chest: No consolidation within the imaged lung apices. Review of the MIP images confirms the above findings CTA HEAD FINDINGS Anterior circulation: The intracranial internal carotid arteries are patent. Atherosclerotic plaque within both vessels. Moderate/severe stenosis of the paraclinoid right ICA. No more than mild stenosis elsewhere within the intracranial internal carotid arteries. The M1 middle cerebral arteries are patent. Atherosclerotic irregularity of the M2 and more distal MCA  vessels, bilaterally. No M2 proximal branch occlusion or high-grade proximal stenosis. The anterior cerebral arteries are patent. The right ACA A1 segment is developmentally absent. No intracranial aneurysm is identified. Posterior circulation: The intracranial vertebral arteries are patent. Atherosclerotic plaque within the right vertebral artery at the V3/V4 junction with mild stenosis. The basilar artery is patent. The posterior cerebral arteries are patent. Hypoplastic right P1 segment. A right posterior communicating artery is present. The left posterior communicating artery is diminutive or absent. Venous sinuses: Within the limitations of contrast timing, no convincing thrombus. Anatomic variants: As described. Review of the MIP images confirms the above findings No emergent large vessel occlusion identified. These results were communicated to [facility] at 4:58 pmon 2/14/2025by text page via the [facility] messaging system. IMPRESSION: CTA neck: 1. The common carotid and internal carotid arteries are patent within the neck. Atherosclerotic plaque bilaterally, as described. Most notably, atherosclerotic plaque within the proximal right ICA results in 50% stenosis. 2. The vertebral arteries are patent within the neck. Venous reflux of contrast precludes accurate assessment of the right vertebral artery V1 segment. Atherosclerotic plaque within the right vertebral artery at the V3/V4 junction with mild stenosis. Atherosclerotic plaque at the left vertebral artery origin with mild/moderate stenosis. 3.  Aortic Atherosclerosis (ICD10-I70.0). CTA head: 1. No proximal intracranial large vessel occlusion or high-grade proximal arterial stenosis identified. 2. Intracranial atherosclerotic disease with multifocal stenoses. Most notably, there is a moderate/severe stenosis within the paraclinoid right internal carotid artery. Electronically Signed   By: Jackey Loge D.O.   On: 05/03/2023 17:04   CT HEAD CODE STROKE WO  CONTRAST Result Date: 05/03/2023 CLINICAL DATA:  Code stroke. Neuro deficit, acute, stroke suspected. EXAM: CT HEAD WITHOUT CONTRAST TECHNIQUE: Contiguous axial images were obtained from the base of the skull through the vertex without intravenous contrast. RADIATION DOSE REDUCTION: This exam was performed according to the departmental dose-optimization program which includes automated exposure control, adjustment of the mA and/or kV according to patient size and/or use of iterative reconstruction technique. COMPARISON:  None. FINDINGS: Brain: Generalized cerebral atrophy. Commensurate prominence of the ventricles and sulci. Patchy and ill-defined hypoattenuation within the cerebral white matter, nonspecific but compatible with mild chronic small vessel ischemic disease. There is no acute intracranial hemorrhage. No demarcated cortical infarct. No extra-axial fluid collection. No evidence of an intracranial mass. No midline shift. Vascular: No hyperdense vessel.  Atherosclerotic calcifications. Skull: No calvarial fracture or aggressive osseous lesion. Sinuses/Orbits: No mass or acute finding within the imaged orbits. Trace mucosal thickening within the left maxillary sinus at the imaged levels. Minimal mucosal thickening scattered within the bilateral ethmoid and frontal sinuses. ASPECTS [facility] Stroke Program Early  CT Score) - Ganglionic level infarction (caudate, lentiform nuclei, internal capsule, insula, M1-M3 cortex): 7 - Supraganglionic infarction (M4-M6 cortex): 3 Total score (0-10 with 10 being normal): 10 No evidence of an acute intracranial abnormality. These results were communicated to [facility] at 4:34 pmon 2/14/2025by text page via the [facility] messaging system. IMPRESSION: 1. No evidence of an acute intracranial abnormality. 2. Parenchymal atrophy and chronic small vessel ischemic disease. 3. Mild paranasal sinus disease at the imaged levels, for as described. Electronically Signed   By: Jackey Loge  D.O.   On: 05/03/2023 16:36    Procedures Procedures    Medications Ordered in ED Medications  sodium chloride flush (NS) 0.9 % injection 3 mL (has no administration in time range)   stroke: early stages of recovery book (has no administration in time range)  acetaminophen (TYLENOL) tablet 650 mg (has no administration in time range)    Or  acetaminophen (TYLENOL) 160 MG/5ML solution 650 mg (has no administration in time range)    Or  acetaminophen (TYLENOL) suppository 650 mg (has no administration in time range)  senna-docusate (Senokot-S) tablet 1 tablet (has no administration in time range)  ondansetron (ZOFRAN) injection 4 mg (has no administration in time range)  apixaban (ELIQUIS) tablet 2.5 mg (2.5 mg Oral Given 05/03/23 2309)  levothyroxine (SYNTHROID) tablet 75 mcg (has no administration in time range)  metoprolol tartrate (LOPRESSOR) tablet 50 mg (has no administration in time range)  atorvastatin (LIPITOR) tablet 20 mg (20 mg Oral Given 05/03/23 2309)  torsemide (DEMADEX) tablet 40 mg (has no administration in time range)  torsemide (DEMADEX) tablet 20 mg (has no administration in time range)  iohexol (OMNIPAQUE) 350 MG/ML injection 75 mL (75 mLs Intravenous Contrast Given 05/03/23 1643)  sodium chloride 0.9 % bolus 500 mL (0 mLs Intravenous Stopped 05/03/23 2012)  potassium chloride SA (KLOR-CON M) CR tablet 20 mEq (20 mEq Oral Given 05/03/23 2309)    ED Course/ Medical Decision Making/ A&P                                 Medical Decision Making Amount and/or Complexity of Data Reviewed Labs: ordered. Radiology: ordered.  Risk Decision regarding hospitalization.   Medical Decision Making:   [name] is a 89 y.o. male who presented to the ED today with left-sided weakness.  Evaluated as code stroke with neurology at bedside.  On reevaluation he does have some improvement still has residual new left-sided weakness.  CT and CTA without hemorrhage or LVO.   Neurology recommending MRI.  He is on Eliquis.  Patient placed on continuous vitals and telemetry monitoring while in ED which was reviewed periodically.  Reviewed and confirmed nursing documentation for past medical history, family history, social history.  Reassessment and Plan:   MRI concerning for acute infarct.  He remained stable.  Discussed with hospitalist and admitted.   Patient's presentation is most consistent with acute complicated illness / injury requiring diagnostic workup.           Final Clinical Impression(s) / ED Diagnoses Final diagnoses:  Acute ischemic stroke [facility])    Rx / DC Orders ED Discharge Orders     None         Laurence Spates, MD 05/03/23 2326

## 2023-05-03 NOTE — Code Documentation (Signed)
Stroke Response Nurse Documentation Code Documentation  [name] is a 89 y.o. male arriving to [facility]  via [facility] on 05/03/2023 with past medical hx of Afib, CHF. On Eliquis (apixaban) daily. Code stroke was activated by EMS.   Patient from lunch where he was LKW at 1500 and now complaining of bilateral arm and leg weaknes (L > R) with some left leg numbness. Pt was at lunch with family at baseline until 1500. Drove home and at 1515 they noted he was not able to get out of the car. Called EMS.   Stroke team at the bedside on patient arrival. Labs drawn and patient cleared for CT by EDP. Patient to CT with team. NIHSS 11, see documentation for details and code stroke times. Patient with bilateral arm weakness, bilateral leg weakness, and bilateral decreased sensation on exam. The following imaging was completed:  CT Head and CTA. Patient is not a candidate for IV Thrombolytic due to being on Eliquis. Patient is not a candidate for IR due to no LVO per provider.   Care Plan: q2 NIHSS/VS x 12 .   Bedside handoff with ED RN Whitney.    Lucila Maine  Stroke Response RN

## 2023-05-03 NOTE — Hospital Course (Addendum)
[name] is a 89 y.o. male with medical history significant for CAD s/p CABG, permanent A-fib on Eliquis, HFpEF, s/p TAVR and mitral valve repair, CKD stage IV, hypothyroidism, and chronic thrombocytopenia who presented with left-sided weakness and admitted for acute to subacute CVA.d

## 2023-05-03 NOTE — Consult Note (Signed)
NEUROLOGY CONSULT NOTE   Date of service: May 03, 2023 Patient Name: [name] MRN:  [id number] DOB:  [birth year] Chief Complaint: "left side weakness" Requesting Provider: Laurence Spates, MD  History of Present Illness  [name] is a 89 y.o. male with hx of A-fib on Eliquis, chronic combined heart failure, CAD, status post CABG x 1 (2004), status post TAVR (2021), OSA, thrombocytopenia, CKD 3, hypertension, hyperlipidemia who was brought in by EMS as a code stroke alert due to acute onset of left-sided weakness. On exam at bridge patient alert and oriented, follows all commands exhibits mild bilateral arm weakness (left greater than right), bilateral leg weakness (left greater than right), subjective sensory deficit to left arm, no neglect, no gaze deviation, no aphasia or dysarthria present. CT head and CT angio negative.  LKW: 1500 Modified rankin score: 0-Completely asymptomatic and back to baseline post- stroke IV Thrombolysis: No, on Eliquis EVT: No, no LVO  NIHSS components Score: Comment  1a Level of Conscious 0[x]  1[]  2[]  3[]      1b LOC Questions 0[x]  1[]  2[]       1c LOC Commands 0[x]  1[]  2[]       2 Best Gaze 0[x]  1[]  2[]       3 Visual 0[x]  1[]  2[]  3[]      4 Facial Palsy 0[x]  1[]  2[]  3[]      5a Motor Arm - left 0[]  1[]  2[x]  3[]  4[]  UN[]    5b Motor Arm - Right 0[]  1[x]  2[]  3[]  4[]  UN[]    6a Motor Leg - Left 0[]  1[]  2[]  3[x]  4[]  UN[]    6b Motor Leg - Right 0[]  1[]  2[]  3[x]  4[]  UN[]    7 Limb Ataxia 0[]  1[]  2[]  3[]  UN[x]     8 Sensory 0[]  1[x]  2[]  UN[]      9 Best Language 0[x]  1[]  2[]  3[]      10 Dysarthria 0[x]  1[]  2[]  UN[]      11 Extinct. and Inattention 0[x]  1[]  2[]       TOTAL:   10      ROS  Comprehensive ROS performed and pertinent positives documented in HPI    Past History   Past Medical History:  Diagnosis Date   Adenomatous colon polyp    CAD (coronary artery disease)    a. S/P CABG x 1 @ time of MV annuloplasty;  b. 02/2010 ETT: neg for  ischemia.   Carotid artery stenosis    1-39% by dopplers 10/2016   Chronic combined systolic (congestive) and diastolic (congestive) heart failure (HCC)    CKD (chronic kidney disease), stage III (HCC)    Current use of long term anticoagulation    a. Coumadin in setting of afib.   Diverticulosis    Dyslipidemia    Dysrhythmia    A-fib   Essential hypertension    H/O mitral valve repair 09/16/2002   Complex valvuloplasty including quadrangular resection of posterior leaflet with artificial Gore-tex neochords and 28 mm Seguin ring annuloplasty - Dr Tyrone Sage   Hemorrhoids    Hyperlipidemia    Hypertension    Permanent atrial fibrillation (HCC)    a. CHA2DS2VASc = 6 -->chronic coumadin;  b. Prev on amio-->d/c 2/2 hypothyroidism. >> resumed 5/16. c. Notes from 2017 indicate interstitial lung disease by pulm appt, question amio toxicity, also increased liver attenuation. Rate control pursued and amiodarone discontinued.   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   s/p TAVR w/ a 29 mm  Edwards Sapien 3 THV via the TF approach by Drs Excell Seltzer and Cornelius Moras   Sleep apnea 12/2018   per patient's spouse wears BIPAP "every other night or so"   Thrombocytopenia (HCC)    TIA (transient ischemic attack)     Past Surgical History:  Procedure Laterality Date   CATARACT EXTRACTION W/ INTRAOCULAR LENS  IMPLANT, BILATERAL     per patient about 4 years ago   CORONARY ARTERY BYPASS GRAFT  2004   ELECTROMAGNETIC NAVIGATION BROCHOSCOPY  10/14/2019   Procedure: NAVIGATION BRONCHOSCOPY;  Surgeon: Josephine Igo, DO;  Location: [facility] ENDOSCOPY;  Service: Pulmonary;;   INTERCOSTAL NERVE BLOCK Right 10/14/2019   Procedure: INTERCOSTAL NERVE BLOCK;  Surgeon: Delight Ovens, MD;  Location: [facility] OR;  Service: Thoracic;  Laterality: Right;   INTRAOPERATIVE TRANSTHORACIC ECHOCARDIOGRAM N/A 05/26/2019   Procedure: INTRAOPERATIVE TRANSTHORACIC ECHOCARDIOGRAM;   Surgeon: Tonny Bollman, MD;  Location: [facility] OR;  Service: Open Heart Surgery;  Laterality: N/A;   MITRAL VALVE REPAIR     RIGHT/LEFT HEART CATH AND CORONARY/GRAFT ANGIOGRAPHY N/A 04/28/2019   Procedure: RIGHT/LEFT HEART CATH AND CORONARY/GRAFT ANGIOGRAPHY;  Surgeon: Tonny Bollman, MD;  Location: [facility] INVASIVE CV LAB;  Service: Cardiovascular;  Laterality: N/A;   SUBMUCOSAL TATTOO INJECTION  10/14/2019   Procedure: ENDOBRONCHIAL DYE INJECTION;  Surgeon: Josephine Igo, DO;  Location: [facility] ENDOSCOPY;  Service: Pulmonary;;   TEE WITHOUT CARDIOVERSION N/A 05/20/2019   Procedure: TRANSESOPHAGEAL ECHOCARDIOGRAM (TEE);  Surgeon: Little Ishikawa, MD;  Location: [facility] ENDOSCOPY;  Service: Cardiovascular;  Laterality: N/A;   TRANSCATHETER AORTIC VALVE REPLACEMENT, TRANSFEMORAL N/A 05/26/2019   Procedure: TRANSCATHETER AORTIC VALVE REPLACEMENT, TRANSFEMORAL;  Surgeon: Tonny Bollman, MD;  Location: [facility] OR;  Service: Open Heart Surgery;  Laterality: N/A;   VIDEO BRONCHOSCOPY WITH ENDOBRONCHIAL NAVIGATION N/A 10/14/2019   Procedure: VIDEO BRONCHOSCOPY;  Surgeon: Josephine Igo, DO;  Location: [facility] ENDOSCOPY;  Service: Pulmonary;  Laterality: N/A;    Family History: Family History  Problem Relation Age of Onset   Stroke Mother        Deceased   Heart disease Father        Deceased   Heart failure Brother        Deceased   Stroke Brother     Social History  reports that he quit smoking about 68 years ago. His smoking use included cigarettes. He started smoking about 70 years ago. He has a 0.2 pack-year smoking history. He has never used smokeless tobacco. He reports that he does not drink alcohol and does not use drugs.  Allergies  Allergen Reactions   Asa [Aspirin] Other (See Comments)    Bleeding- has internal hemorrhoids   Flomax [Tamsulosin] Palpitations and Other (See Comments)    Made his heart go out of rhythm   Lipitor [Atorvastatin]     "loss of muscle control"   Mirtazapine     "Tremor"    Pirfenidone     Weight loss, loss of appetite.   Tramadol     Nausea/vomiting    Medications   Current Facility-Administered Medications:    sodium chloride 0.9 % bolus 500 mL, 500 mL, Intravenous, Once, Earlene Plater, Jonathon H, MD   sodium chloride flush (NS) 0.9 % injection 3 mL, 3 mL, Intravenous, Once, Laurence Spates, MD  Current Outpatient Medications:    apixaban (ELIQUIS) 5 MG TABS tablet, Take 2.5 mg by mouth 2 (two) times daily., Disp: , Rfl:    atorvastatin (LIPITOR) 20 MG tablet, Take 20 mg by mouth 3 (  three) times a week. TAKE 20 MG ON MONDAY, WEDNESDAY, AND FRIDAY NIGHTS., Disp: , Rfl:    levothyroxine (SYNTHROID) 75 MCG tablet, Take 75 mcg by mouth daily before breakfast. , Disp: , Rfl:    lidocaine 4 %, Cut patch in half and apply to painful areas 1 hour before bed, Disp: 15 patch, Rfl: 1   metoprolol succinate (TOPROL-XL) 50 MG 24 hr tablet, Take 1 tablet (50 mg total) by mouth daily. Take with or immediately following a meal., Disp: 90 tablet, Rfl: 3   Polyethyl Glycol-Propyl Glycol (SYSTANE OP), Place 1 drop into both eyes 3 (three) times daily as needed (dry/irritated eyes.). , Disp: , Rfl:    torsemide (DEMADEX) 20 MG tablet, Take 20-40 mg by mouth See admin instructions. Take 2 tablets (40 mg) by mouth in the morning & take 1 tablet (20 mg) by mouth at night., Disp: , Rfl:    vitamin B-12 (CYANOCOBALAMIN) 1000 MCG tablet, Take 1,000 mcg by mouth daily. , Disp: , Rfl:   Vitals   Vitals:   2023/05/27 1647 May 27, 2023 1648 05/27/23 1649 05/27/2023 1655  BP: (!) 150/86     Pulse:  80 65   Resp: 17 16 18    Temp:    (!) 97.4 F (36.3 C)  TempSrc:    Temporal  SpO2:  100% 100%   Weight:        Body mass index is 27.22 kg/m (pended).  Physical Exam   Constitutional: Appears well-developed and well-nourished.  Psych: Affect appropriate to situation.  Eyes: No scleral injection.  HENT: No OP obstruction.  Head: Normocephalic.  Cardiovascular: Normal rate and regular  rhythm.  Respiratory: Effort normal, non-labored breathing.  GI: Soft.  No distension. There is no tenderness.  Skin: WDI.   Neurologic Examination   Neuro: Mental Status: Patient is awake, alert, oriented to person, place, month, year, and situation. Patient is able to give a clear and coherent history. No signs of aphasia or neglect Cranial Nerves: II: Visual Fields are full. Pupils are equal, round, and reactive to light.   III,IV, VI: EOMI without ptosis or diploplia.  V: Facial sensation is symmetric to temperature VII: Facial movement is symmetric.  VIII: hearing is intact to voice X: Uvula elevates symmetrically XI: Shoulder shrug is symmetric. XII: tongue is midline without atrophy or fasciculations.  Motor: Tone is normal. Bulk is normal.  Bilateral mild arm weakness (left greater than right) RUE: 4/5, no drift LUE: 4-/5, slight drift Bilateral leg weakness (left greater than right) Unable to lift either leg fully off bed Sensory: Inconsistent responses to sensation testing, subjectively noted to be decreased on left leg.  Cerebellar: No overt ataxia noted.    Labs/Imaging/Neurodiagnostic studies   CBC:  Recent Labs  Lab May 27, 2023 1618 05-27-23 1631  WBC 7.0  --   NEUTROABS 5.2  --   HGB 15.2 15.6  HCT 46.2 46.0  MCV 90.6  --   PLT 102*  --    Basic Metabolic Panel:  Lab Results  Component Value Date   NA 140 2023-05-27   K 3.4 (L) 05-27-23   CO2 27 05/27/23   GLUCOSE 106 (H) 05-27-2023   BUN 39 (H) 2023/05/27   CREATININE 2.70 (H) 2023-05-27   CALCIUM 9.4 May 27, 2023   GFRNONAA 24 (L) 2023-05-27   GFRAA 35 (L) 10/16/2019   Lipid Panel:  Lab Results  Component Value Date   LDLCALC 135 (H) 06/17/2017   HgbA1c:  Lab Results  Component Value Date  HGBA1C 5.5 05/22/2019   Urine Drug Screen: No results found for: "LABOPIA", "COCAINSCRNUR", "LABBENZ", "AMPHETMU", "THCU", "LABBARB"  Alcohol Level     Component Value Date/Time   ETH <10  05/03/2023 1618   INR  Lab Results  Component Value Date   INR 1.2 05/03/2023   APTT  Lab Results  Component Value Date   APTT 30 05/03/2023   AED levels: No results found for: "PHENYTOIN", "ZONISAMIDE", "LAMOTRIGINE", "LEVETIRACETA"  CT Head without contrast(Personally reviewed): No evidence of an acute intracranial abnormality. Parenchymal atrophy and chronic small vessel ischemic disease.  CT angio Head and Neck with contrast(Personally reviewed): No proximal intracranial large vessel occlusion or high-grade proximal arterial stenosis identified. Intracranial atherosclerotic disease with multifocal stenoses. Most notably, there is a moderate/severe stenosis within the paraclinoid right internal carotid artery.  ASSESSMENT   [name] is a 89 y.o. male with hx of A-fib on Eliquis, chronic combined heart failure, CAD, status post CABG x 1 (2004), status post TAVR (2021), OSA, thrombocytopenia, CKD 3, hypertension, hyperlipidemia who was brought in by EMS as a code stroke alert.  On exam at bridge patient alert and oriented, follows all commands exhibits mild bilateral arm weakness (left greater than right), bilateral leg weakness (left greater than right), subjective sensory deficit to left arm, no neglect, no gaze deviation, no aphasia or dysarthria present.  CT head and CT angio negative.   RECOMMENDATIONS   - MRI Brain  If positive for stroke, admit for full stroke workup - MRI C Spine   We will follow imaging results.  ______________________________________________________________________   Pt seen by Neuro NP/APP and later by MD. Note/plan to be edited by MD as needed.    Micheal January, DNP, AGACNP-BC [facility] Please use AMION for contact information & EPIC for messaging.  NEUROHOSPITALIST ADDENDUM Performed a face to face diagnostic evaluation.   I have reviewed the contents of history and physical exam as documented by PA/ARNP/Resident and  agree with above documentation.  I have discussed and formulated the above plan as documented. Edits to the note have been made as needed.  Impression/Key exam findings/Plan: 79M with history of permanent A-fib on Eliquis and took his last dose this morning.  He sat down in the car after being at a restaurant and will try to get up from the car, was unable to get out.  Code stroke was activated and he was brought in with concerns for left-sided weakness.  His exam was noted to fluctuate and there was a significant component of giveaway weakness.  Specifically, where he noticed was that his grip strength in right upper extremity was 5 out of 5 and so was elbow flexion and elbow extension.  However, on repeat exam, tells since that he is unable to grip with his hand and there was a definitive giveaway component to his weakness.  He is not a candidate for TNK as he is on Eliquis.  He is not a candidate for thrombectomy due to no LVO.  They will get an MRI brain and C-spine without contrast and if they are negative, will need PT and OT and he can be discharged from neurostandpoint.  He do not have any facial weakness, so I would emphasize getting the MRI C-spine in addition to the MRI brain.  Erick Blinks, MD [facility] [phone]   If 7pm to 7am, please call on call as listed on AMION.

## 2023-05-03 NOTE — ED Notes (Signed)
RN unable to assess 18:30 NIH due to pt being in MRI. RN will assess NIH when pt returns.

## 2023-05-03 NOTE — ED Notes (Signed)
Patient transported to MRI

## 2023-05-03 NOTE — ED Triage Notes (Signed)
Pt BIB [facility] as a Code Stroke Pt LSN was 15:00 Per family, they arrived home and pt was unable to get out of the car and c/o left sided weakness. Pt was HOH. Pt takes 2.5mg  of eliquis every morning and then takes 2.5mg  of eliquis MWF nights per wife. Pt a&ox4.   CBG 300 190/78 18G R AC

## 2023-05-04 ENCOUNTER — Observation Stay (HOSPITAL_BASED_OUTPATIENT_CLINIC_OR_DEPARTMENT_OTHER): Payer: PPO

## 2023-05-04 DIAGNOSIS — I6389 Other cerebral infarction: Secondary | ICD-10-CM

## 2023-05-04 DIAGNOSIS — I639 Cerebral infarction, unspecified: Secondary | ICD-10-CM | POA: Diagnosis not present

## 2023-05-04 LAB — CBC
HCT: 43.1 % (ref 39.0–52.0)
Hemoglobin: 14.2 g/dL (ref 13.0–17.0)
MCH: 29.3 pg (ref 26.0–34.0)
MCHC: 32.9 g/dL (ref 30.0–36.0)
MCV: 88.9 fL (ref 80.0–100.0)
Platelets: 92 10*3/uL — ABNORMAL LOW (ref 150–400)
RBC: 4.85 MIL/uL (ref 4.22–5.81)
RDW: 14.5 % (ref 11.5–15.5)
WBC: 7.1 10*3/uL (ref 4.0–10.5)
nRBC: 0 % (ref 0.0–0.2)

## 2023-05-04 LAB — ECHOCARDIOGRAM COMPLETE
AR max vel: 0.98 cm2
AV Area VTI: 0.83 cm2
AV Area mean vel: 1.04 cm2
AV Mean grad: 4.3 mm[Hg]
AV Peak grad: 9.4 mm[Hg]
Ao pk vel: 1.53 m/s
Area-P 1/2: 2.99 cm2
Height: 67.992 in
MV M vel: 4.27 m/s
MV Peak grad: 72.9 mm[Hg]
S' Lateral: 2.4 cm
Weight: 2825.42 [oz_av]

## 2023-05-04 LAB — BASIC METABOLIC PANEL
Anion gap: 11 (ref 5–15)
BUN: 35 mg/dL — ABNORMAL HIGH (ref 8–23)
CO2: 23 mmol/L (ref 22–32)
Calcium: 9 mg/dL (ref 8.9–10.3)
Chloride: 105 mmol/L (ref 98–111)
Creatinine, Ser: 2.31 mg/dL — ABNORMAL HIGH (ref 0.61–1.24)
GFR, Estimated: 26 mL/min — ABNORMAL LOW (ref >60)
Glucose, Bld: 113 mg/dL — ABNORMAL HIGH (ref 70–99)
Potassium: 3.7 mmol/L (ref 3.5–5.1)
Sodium: 139 mmol/L (ref 135–145)

## 2023-05-04 LAB — LIPID PANEL
Cholesterol: 153 mg/dL (ref 0–200)
HDL: 42 mg/dL (ref >40)
LDL Cholesterol: 98 mg/dL (ref 0–99)
Total CHOL/HDL Ratio: 3.6 {ratio}
Triglycerides: 64 mg/dL (ref <150)
VLDL: 13 mg/dL (ref 0–40)

## 2023-05-04 LAB — HEMOGLOBIN A1C
Hgb A1c MFr Bld: 6.1 % — ABNORMAL HIGH (ref 4.8–5.6)
Mean Plasma Glucose: 128.37 mg/dL

## 2023-05-04 MED ORDER — APIXABAN 5 MG PO TABS: 5.0000 mg | ORAL_TABLET | Freq: Two times a day (BID) | ORAL | 0 refills | Status: DC

## 2023-05-04 MED ORDER — EZETIMIBE 10 MG PO TABS
10.0000 mg | ORAL_TABLET | Freq: Every day | ORAL | Status: DC
  Administered 2023-05-04: 10 mg via ORAL
  Filled 2023-05-04: qty 1

## 2023-05-04 MED ORDER — EZETIMIBE 10 MG PO TABS: 10.0000 mg | ORAL_TABLET | Freq: Every day | ORAL | 0 refills | Status: DC

## 2023-05-04 NOTE — Progress Notes (Addendum)
Progress Note   Patient: [name] YQM:[id number] DOB: Jul 14, 1931 DOA: 05/03/2023     0 DOS: the patient was seen and examined on 05/04/2023   Brief hospital course: [name] is a 89 y.o. male with medical history significant for CAD s/p CABG, permanent A-fib on Eliquis, HFpEF, s/p TAVR and mitral valve repair, CKD stage IV, hypothyroidism, and chronic thrombocytopenia who presented with left-sided weakness and admitted for acute to subacute CVA.  CT Head has demonstrated no evidence of an acute intracranial abnormality. There is parenchymal atrophy and chronic small vessel ischemic disease. There is mild paranasal sinus disease.  CTA head and neck with and without contrast: Neck has demonstrated patent common carotid and internal carotid arteries within the neck. There is ASCVD bilaterally, and especially within the proximal right ICA results in 50% stenosis. The vertebral arteries are patent within the neck. The V1 segment of the right vertebral artery is unable to be evaluated due to venous reflux of contrast. Atherosclerotic plaque within the right vertebral artery at the V3/V4 junction with mild stenosis. Atherosclerotic plaque at the left vertebral artery origin with mild/moderate stenosis. There is atherosclerosis of the aorta. The head has demonstrated no proximal intracranial large vessel occlusion or high grade proximal arterial stenosis. There is moderate/ssevere stenosis within the paraclinoid right internal carotid artery.   MRI Brain has demonstrated multiple small and well defined acute or early subacute infarcts in high right post frontal periolandic area.  MRI C-spine has demonstrated multilevel cervical spondylosis that is most pronounced at C3-C4 where there is moderate canal stenosis and moderate bilateral foraminal stenosis. There is mild-moderate canal stenosisd at C4-5 with moderate-severe left and mild moderate right foraminal stenosis. There is also mild canal  stenosis and moderate right foraminal stenosis at C5-6.  LDL 98  HbA1c 6.1  Echocardiogram is pending.  The patient has been evaluated by neurology. Recommendations are for continuing anticoagulation eliquis 2.5 bid. The patient was on this as outpatient for stroke prophylaxis for Atrial Fibrillation. Will maintain BP less than 220/110. Will add ezetimibe 10 mg daily to bring LDL down under 70.   Assessment and Plan: Principal Problem:   Acute CVA (cerebrovascular accident) (HCC) Active Problems:   Permanent atrial fibrillation   Coronary artery disease involving native coronary artery of native heart without angina pectoris   Thrombocytopenia (HCC)   Hypothyroidism   CKD (chronic kidney disease), stage IV (HCC)   Chronic heart failure with preserved ejection fraction (HFpEF) (HCC)   [name] is a 89 y.o. male with medical history significant for CAD s/p CABG, permanent A-fib on Eliquis, HFpEF, s/p TAVR and mitral valve repair, CKD stage IV, hypothyroidism, and chronic thrombocytopenia who presented with left-sided weakness and admitted for acute to subacute CVA.   Acute to subacute infarcts in the right posterior frontal perirolandic area: CT Head has demonstrated no evidence of an acute intracranial abnormality. There is parenchymal atrophy and chronic small vessel ischemic disease. There is mild paranasal sinus disease.  CTA head and neck with and without contrast: Neck has demonstrated patent common carotid and internal carotid arteries within the neck. There is ASCVD bilaterally, and especially within the proximal right ICA results in 50% stenosis. The vertebral arteries are patent within the neck. The V1 segment of the right vertebral artery is unable to be evaluated due to venous reflux of contrast. Atherosclerotic plaque within the right vertebral artery at the V3/V4 junction with mild stenosis. Atherosclerotic plaque at the left vertebral artery origin  with mild/moderate  stenosis. There is atherosclerosis of the aorta. The head has demonstrated no proximal intracranial large vessel occlusion or high grade proximal arterial stenosis. There is moderate/ssevere stenosis within the paraclinoid right internal carotid artery.   MRI Brain has demonstrated multiple small and well defined acute or early subacute infarcts in high right post frontal periolandic area.  MRI C-spine has demonstrated multilevel cervical spondylosis that is most pronounced at C3-C4 where there is moderate canal stenosis and moderate bilateral foraminal stenosis. There is mild-moderate canal stenosisd at C4-5 with moderate-severe left and mild moderate right foraminal stenosis. There is also mild canal stenosis and moderate right foraminal stenosis at C5-6.  LDL 98  HbA1c 6.1  Echocardiogram is pending.  The patient has been evaluated by neurology. Recommendations are for continuing anticoagulation eliquis 2.5 bid. The patient was on this as outpatient for stroke prophylaxis for Atrial Fibrillation. Will maintain BP less than 220/110. Will add ezetimibe 10 mg daily to bring LDL down under 70.    Permanent atrial fibrillation: Continue Lopressor 50 mg daily and Eliquis.   Chronic HFpEF: Previous HFrEF, EF recovered to 60-65%.  Euvolemic on admission. -Continue Lopressor 50 mg daily -Continue torsemide 40 mg a.m. and 20 mg p.m. -Update echocardiogram as above -Not on ACE/ARB/ARNI/MRA given history of CKD   CKD stage IV: Renal function relatively stable.  Monitor.   CAD s/p CABG S/p TAVR S/p mitral valve repair: Stable, denies chest pain.  Continue atorvastatin.  Not on aspirin since he is on Eliquis.   Chronic thrombocytopenia: Stable without obvious bleeding.   Hypothyroidism: Continue Synthroid.   DVT prophylaxis: apixaban (ELIQUIS) tablet 2.5 mg Start: 05/03/23 2345 apixaban (ELIQUIS) tablet 2.5 mg   Code Status: Full code, confirmed with patient on admission Family  Communication: Discussed with patient, he has discussed with family Disposition Plan: From home and likely discharge to home pending clinical progress Consults called: Neurology       Subjective: The patient is resting comfortably. No new complaints. He states that he has had complete resolution of his presenting deficits.  Physical Exam: Vitals:   05/03/23 2211 05/04/23 0306 05/04/23 0729 05/04/23 1123  BP: (!) 149/88 (!) 158/81 (!) 145/88 126/70  Pulse: 96 76 82 100  Resp: 18 18 17 18   Temp: 98.9 F (37.2 C) (!) 97.3 F (36.3 C) 97.7 F (36.5 C) 97.8 F (36.6 C)  TempSrc: Oral Oral Oral Oral  SpO2: 100% 98% 100% 98%  Weight: 80.1 kg     Height: 5' 7.99" (1.727 m)      Exam:  Constitutional:  The patient is awake, alert, and oriented x 3. No acute distress. Respiratory:  No increased work of breathing. No wheezes, rales, or rhonchi No tactile fremitus Cardiovascular:  Regular rate and rhythm No murmurs, ectopy, or gallups. No lateral PMI. No thrills. Abdomen:  Abdomen is soft, non-tender, non-distended No hernias, masses, or organomegaly Normoactive bowel sounds.  Musculoskeletal:  No cyanosis, clubbing, or edema Skin:  No rashes, lesions, ulcers palpation of skin: no induration or nodules Neurologic:  CN 2-12 intact Sensation all 4 extremities intact Psychiatric:  Mental status Mood, affect appropriate Orientation to person, place, time  judgment and insight appear intact  Data Reviewed:  CT Brain CTA head and neck MRI Brain MRI C-Spine  Family Communication: None available  Disposition: Status is: Observation The patient remains OBS appropriate and will d/c before 2 midnights.  Planned Discharge Destination: Home    Time spent: 35 minutes  Author: Makhya Arave  Tremell Reimers, DO 05/04/2023 2:44 PM  For on call review [email or web address].

## 2023-05-04 NOTE — Care Management (Signed)
Called patient for MOON unavailable at this time. Called Mobile number for wife no answer. Wiill make another attempt soon

## 2023-05-04 NOTE — Care Management Obs Status (Signed)
MEDICARE OBSERVATION STATUS NOTIFICATION   Patient Details  Name: Kevyn Boquet MRN: [id number] Date of Birth: 1931/04/10   Medicare Observation Status Notification Given:  Yes    Lawerance Sabal, RN 05/04/2023, 12:29 PM

## 2023-05-04 NOTE — Discharge Summary (Signed)
Physician Discharge Summary   Patient: [name] MRN: [id number] DOB: 1931-04-14  Admit date:     05/03/2023  Discharge date: 05/04/23  Discharge Physician: Fran Lowes   PCP: Noberto Retort, MD   Recommendations at discharge:    Discharge to home Follow up with Stroke clinic in 2 months. Lipid panel to be checked at this visit and reported to neurologist. Follow up with PCP in 7-10 days.  Discharge Diagnoses: Principal Problem:   Acute CVA (cerebrovascular accident) [facility]) Active Problems:   Permanent atrial fibrillation   Coronary artery disease involving native coronary artery of native heart without angina pectoris   Thrombocytopenia (HCC)   Hypothyroidism   CKD (chronic kidney disease), stage IV (HCC)   Chronic heart failure with preserved ejection fraction (HFpEF) (HCC)  Resolved Problems:   * No resolved hospital problems. *  Hospital Course: [name] is a 89 y.o. male with medical history significant for CAD s/p CABG, permanent A-fib on Eliquis, HFpEF, s/p TAVR and mitral valve repair, CKD stage IV, hypothyroidism, and chronic thrombocytopenia who presented with left-sided weakness and admitted for acute to subacute CVA.  The patient was admitted to a telemetry bed.   CT Head has demonstrated no evidence of an acute intracranial abnormality. There is parenchymal atrophy and chronic small vessel ischemic disease. There is mild paranasal sinus disease.   CTA head and neck with and without contrast: Neck has demonstrated patent common carotid and internal carotid arteries within the neck. There is ASCVD bilaterally, and especially within the proximal right ICA results in 50% stenosis. The vertebral arteries are patent within the neck. The V1 segment of the right vertebral artery is unable to be evaluated due to venous reflux of contrast. Atherosclerotic plaque within the right vertebral artery at the V3/V4 junction with mild stenosis. Atherosclerotic plaque at the  left vertebral artery origin with mild/moderate stenosis. There is atherosclerosis of the aorta. The head has demonstrated no proximal intracranial large vessel occlusion or high grade proximal arterial stenosis. There is moderate/ssevere stenosis within the paraclinoid right internal carotid artery.   MRI Brain has demonstrated multiple small and well defined acute or early subacute infarcts in high right post frontal periolandic area.   MRI C-spine has demonstrated multilevel cervical spondylosis that is most pronounced at C3-C4 where there is moderate canal stenosis and moderate bilateral foraminal stenosis. There is mild-moderate canal stenosisd at C4-5 with moderate-severe left and mild moderate right foraminal stenosis. There is also mild canal stenosis and moderate right foraminal stenosis at C5-6.   Echocardiogram demonstrated an EF of 55-60%, LVH with indeterminate diastolic parameters. The patient has a MVR and a TAVR. Both are in their proper position.  The patient was evaluated by neurology. The recommendations are: Therapeutic dosing of Eliquis at 5 mg bid. Addition of Zetia daily to the patient's three times weekly atorvastatin 20 mg. The patient will need to have his lipid panel checked at his follow up. If this does not lower LDL to the desired range (<65), then Praluent or Repatha should be considered.  The patient has had resolution of his deficits. He will be discharged to home in fair condition.  Assessment and Plan: Principal Problem:   Acute CVA (cerebrovascular accident) [facility]) Active Problems:   Permanent atrial fibrillation   Coronary artery disease involving native coronary artery of native heart without angina pectoris   Thrombocytopenia (HCC)   Hypothyroidism   CKD (chronic kidney disease), stage IV (HCC)   Chronic heart failure  with preserved ejection fraction (HFpEF) (HCC)   [name] is a 89 y.o. male with medical history significant for CAD s/p CABG,  permanent A-fib on Eliquis, HFpEF, s/p TAVR and mitral valve repair, CKD stage IV, hypothyroidism, and chronic thrombocytopenia who presented with left-sided weakness and admitted for acute to subacute CVA.   Acute to subacute infarcts in the right posterior frontal perirolandic area: CT Head has demonstrated no evidence of an acute intracranial abnormality. There is parenchymal atrophy and chronic small vessel ischemic disease. There is mild paranasal sinus disease.   CTA head and neck with and without contrast: Neck has demonstrated patent common carotid and internal carotid arteries within the neck. There is ASCVD bilaterally, and especially within the proximal right ICA results in 50% stenosis. The vertebral arteries are patent within the neck. The V1 segment of the right vertebral artery is unable to be evaluated due to venous reflux of contrast. Atherosclerotic plaque within the right vertebral artery at the V3/V4 junction with mild stenosis. Atherosclerotic plaque at the left vertebral artery origin with mild/moderate stenosis. There is atherosclerosis of the aorta. The head has demonstrated no proximal intracranial large vessel occlusion or high grade proximal arterial stenosis. There is moderate/ssevere stenosis within the paraclinoid right internal carotid artery.    MRI Brain has demonstrated multiple small and well defined acute or early subacute infarcts in high right post frontal periolandic area.   MRI C-spine has demonstrated multilevel cervical spondylosis that is most pronounced at C3-C4 where there is moderate canal stenosis and moderate bilateral foraminal stenosis. There is mild-moderate canal stenosisd at C4-5 with moderate-severe left and mild moderate right foraminal stenosis. There is also mild canal stenosis and moderate right foraminal stenosis at C5-6.   LDL 98   HbA1c 6.1   Echocardiogram demonstrated an EF of 55-60%, LVH with indeterminate diastolic parameters. The  patient has a MVR and a TAVR. Both are in their proper position.   The patient has been evaluated by neurology. Recommendations are for continuing anticoagulation eliquis 2.5 bid. The patient was on this as outpatient for stroke prophylaxis for Atrial Fibrillation. Will maintain BP less than 220/110. Will add ezetimibe 10 mg daily to bring LDL down under 70.    Permanent atrial fibrillation: Continue Lopressor 50 mg daily and Eliquis. The patient has been taking Eliquis 2.5 mg bid which is subtherapeutic for stroke prevention in the setting of artificial heart valves. He is discharged on Eliquis 5 mg bid.   Chronic HFpEF: Previous HFrEF, EF recovered to 60-65%.  Euvolemic on admission. -Continue Lopressor 50 mg daily -Continue torsemide 40 mg a.m. and 20 mg p.m. -Update echocardiogram as above -Not on ACE/ARB/ARNI/MRA given history of CKD   CKD stage IV: Renal function relatively stable.  Monitor.   CAD s/p CABG S/p TAVR S/p mitral valve repair: Stable, denies chest pain.  Continue atorvastatin.  Not on aspirin since he is on Eliquis. The patient has been taking Eliquis 2.5 mg bid which is subtherapeutic for stroke prevention in the setting of artificial heart valves. He is discharged on Eliquis 5 mg bid.   Chronic thrombocytopenia: Stable without obvious bleeding.   Hypothyroidism: Continue Synthroid.   DVT prophylaxis: apixaban (ELIQUIS) tablet 2.5 mg Start: 05/03/23 2345 apixaban (ELIQUIS) tablet 2.5 mg   Code Status: Full code, confirmed with patient on admission Family Communication: Discussed with patient, he has discussed with family Disposition Plan: From home and likely discharge to home pending clinical progress Consults called: Neurology  Diet recommendation:  Discharge Diet Orders (From admission, onward)     Start     Ordered   05/04/23 0000  Diet - low sodium heart healthy        05/04/23 1714           Cardiac diet DISCHARGE MEDICATION: Allergies as of  05/04/2023       Reactions   Crestor [rosuvastatin] Other (See Comments)   myalgia   Asa [aspirin] Other (See Comments)   Bleeding- has internal hemorrhoids   Flomax [tamsulosin] Palpitations, Other (See Comments)   Made his heart go out of rhythm   Lipitor [atorvastatin]    "loss of muscle control"   Mirtazapine    "Tremor"   Pirfenidone    Weight loss, loss of appetite.   Tramadol Nausea And Vomiting        Medication List     STOP taking these medications    lidocaine 4 %   SYSTANE OP       TAKE these medications    allopurinol 100 MG tablet Commonly known as: ZYLOPRIM Take 100 mg by mouth daily.   apixaban 5 MG Tabs tablet Commonly known as: ELIQUIS Take 1 tablet (5 mg total) by mouth 2 (two) times daily. What changed:  how much to take when to take this additional instructions   atorvastatin 20 MG tablet Commonly known as: LIPITOR Take 20 mg by mouth 3 (three) times a week. TAKE 20 MG ON MONDAY, WEDNESDAY, AND FRIDAY NIGHTS.   Cholecalciferol 50 MCG (2000 UT) Tabs Take 1 tablet by mouth daily.   cyanocobalamin 500 MCG tablet Commonly known as: VITAMIN B12 Take 500 mcg by mouth daily.   docusate sodium 100 MG capsule Commonly known as: COLACE Take 100 mg by mouth daily.   ezetimibe 10 MG tablet Commonly known as: ZETIA Take 1 tablet (10 mg total) by mouth daily. Start taking on: May 05, 2023   hydrocortisone cream 0.5 % Apply 1 Application topically 2 (two) times daily as needed for itching.   levothyroxine 75 MCG tablet Commonly known as: SYNTHROID Take 75 mcg by mouth daily before breakfast.   metoprolol tartrate 100 MG tablet Commonly known as: LOPRESSOR Take 50 mg by mouth daily.   torsemide 20 MG tablet Commonly known as: DEMADEX Take 20-40 mg by mouth See admin instructions. Take 2 tablets (40 mg) by mouth in the morning & take 1 tablet (20 mg) by mouth at night.        Discharge Exam: Filed Weights   05/03/23 1623  05/03/23 2211  Weight: (P) 81.2 kg 80.1 kg   Exam:  Constitutional:  The patient is awake, alert, and oriented x 3. No acute distress. Respiratory:  No increased work of breathing. No wheezes, rales, or rhonchi No tactile fremitus Cardiovascular:  Regular rate and rhythm No murmurs, ectopy, or gallups. No lateral PMI. No thrills. Abdomen:  Abdomen is soft, non-tender, non-distended No hernias, masses, or organomegaly Normoactive bowel sounds.  Musculoskeletal:  No cyanosis, clubbing, or edema Skin:  No rashes, lesions, ulcers palpation of skin: no induration or nodules Neurologic:  CN 2-12 intact Sensation all 4 extremities intact Psychiatric:  Mental status Mood, affect appropriate Orientation to person, place, time  judgment and insight appear intact   Condition at discharge: fair  The results of significant diagnostics from this hospitalization (including imaging, microbiology, ancillary and laboratory) are listed below for reference.   Imaging Studies: ECHOCARDIOGRAM COMPLETE Result Date: 05/04/2023    ECHOCARDIOGRAM REPORT   Patient  Name:   Gaynell Face Date of Exam: 05/04/2023 Medical Rec #:  [id number]      Height:       68.0 in Accession #:    [id number]     Weight:       176.6 lb Date of Birth:  December 17, 1931     BSA:          1.938 m Patient Age:    89 years       BP:           145/88 mmHg Patient Gender: M              HR:           73 bpm. Exam Location:  Inpatient Procedure: 2D Echo, Cardiac Doppler and Color Doppler (Both Spectral and Color            Flow Doppler were utilized during procedure). Indications:    Stroke I63.9  History:        Patient has prior history of Echocardiogram examinations, most                 recent 05/19/2020. CHF, CAD, Prior CABG, Stroke and CKD, stage 3,                 Arrythmias:Atrial Fibrillation; Risk Factors:Sleep Apnea and                 Dyslipidemia.                 Aortic Valve: valve is present in the aortic position.  Procedure                 Date: 05/26/2019.                 Mitral Valve: unknown size prosthetic prosthetic annuloplasty                 ring valve is present in the mitral position.  Sonographer:    Lucendia Herrlich RCS Referring Phys: [id number] VISHAL R PATEL IMPRESSIONS  1. Left ventricular ejection fraction, by estimation, is 55 to 60%. The left ventricle has normal function. Left ventricular endocardial border not optimally defined to evaluate regional wall motion. There is mild left ventricular hypertrophy. Left ventricular diastolic parameters are indeterminate.  2. Right ventricular systolic function is normal. The right ventricular size is normal.  3. The mitral valve has been repaired/replaced. Mild to moderate mitral valve regurgitation. No evidence of mitral stenosis. Moderate mitral annular calcification. There is a unknown size prosthetic prosthetic annuloplasty ring present in the mitral position.  4. The tricuspid valve is abnormal.  5. Edwards Sapien 3 THV size 29 mm is in the AV position. . The aortic valve has been repaired/replaced. Aortic valve regurgitation is not visualized. No aortic stenosis is present. There is a valve present in the aortic position. Procedure Date: 05/26/2019.  6. The inferior vena cava is normal in size with greater than 50% respiratory variability, suggesting right atrial pressure of 3 mmHg. FINDINGS  Left Ventricle: Left ventricular ejection fraction, by estimation, is 55 to 60%. The left ventricle has normal function. Left ventricular endocardial border not optimally defined to evaluate regional wall motion. Strain imaging was not performed. The left ventricular internal cavity size was normal in size. There is mild left ventricular hypertrophy. Left ventricular diastolic parameters are indeterminate. Right Ventricle: The right ventricular size is normal. Right vetricular wall thickness was not well visualized. Right ventricular systolic function is  normal. Left Atrium:  Left atrial size was normal in size. Right Atrium: Right atrial size was normal in size. Pericardium: There is no evidence of pericardial effusion. Mitral Valve: The mitral valve has been repaired/replaced. There is moderate thickening of the mitral valve leaflet(s). There is moderate calcification of the mitral valve leaflet(s). Moderate mitral annular calcification. Mild to moderate mitral valve regurgitation. There is a unknown size prosthetic prosthetic annuloplasty ring present in the mitral position. No evidence of mitral valve stenosis. Tricuspid Valve: The tricuspid valve is abnormal. Tricuspid valve regurgitation is mild . No evidence of tricuspid stenosis. Aortic Valve: Edwards Sapien 3 THV size 29 mm is in the AV position. The aortic valve has been repaired/replaced. Aortic valve regurgitation is not visualized. No aortic stenosis is present. Aortic valve mean gradient measures 4.3 mmHg. Aortic valve peak  gradient measures 9.4 mmHg. Aortic valve area, by VTI measures 0.83 cm. There is a valve present in the aortic position. Procedure Date: 05/26/2019. Pulmonic Valve: The pulmonic valve was not well visualized. Pulmonic valve regurgitation is mild. No evidence of pulmonic stenosis. Aorta: The aortic root and ascending aorta are structurally normal, with no evidence of dilitation. Venous: The inferior vena cava is normal in size with greater than 50% respiratory variability, suggesting right atrial pressure of 3 mmHg. IAS/Shunts: No atrial level shunt detected by color flow Doppler. Additional Comments: 3D imaging was not performed.  LEFT VENTRICLE PLAX 2D LVIDd:         3.30 cm   Diastology LVIDs:         2.40 cm   LV e' medial:    6.38 cm/s LV PW:         1.10 cm   LV E/e' medial:  23.2 LV IVS:        1.20 cm   LV e' lateral:   11.70 cm/s LVOT diam:     1.80 cm   LV E/e' lateral: 12.6 LV SV:         25 LV SV Index:   13 LVOT Area:     2.54 cm  RIGHT VENTRICLE            IVC RV S prime:     8.08 cm/s  IVC  diam: 1.90 cm TAPSE (M-mode): 1.6 cm LEFT ATRIUM             Index        RIGHT ATRIUM           Index LA diam:        4.70 cm 2.42 cm/m   RA Area:     18.75 cm LA Vol (A2C):   47.5 ml 24.50 ml/m  RA Volume:   53.25 ml  27.47 ml/m LA Vol (A4C):   53.4 ml 27.55 ml/m LA Biplane Vol: 53.0 ml 27.34 ml/m  AORTIC VALVE AV Area (Vmax):    0.98 cm AV Area (Vmean):   1.04 cm AV Area (VTI):     0.83 cm AV Vmax:           153.01 cm/s AV Vmean:          94.464 cm/s AV VTI:            0.306 m AV Peak Grad:      9.4 mmHg AV Mean Grad:      4.3 mmHg LVOT Vmax:         59.20 cm/s LVOT Vmean:        38.600 cm/s LVOT VTI:  0.100 m LVOT/AV VTI ratio: 0.33  AORTA Ao Root diam: 2.70 cm Ao Asc diam:  3.30 cm MITRAL VALVE                TRICUSPID VALVE MV Area (PHT): 2.99 cm     TR Peak grad:   21.5 mmHg MV Decel Time: 254 msec     TR Vmax:        232.00 cm/s MR Peak grad: 72.9 mmHg MR Vmax:      427.00 cm/s   SHUNTS MV E velocity: 148.00 cm/s  Systemic VTI:  0.10 m MV A velocity: 62.20 cm/s   Systemic Diam: 1.80 cm MV E/A ratio:  2.38 Dina Rich MD Electronically signed by Dina Rich MD Signature Date/Time: 05/04/2023/4:06:28 PM    Final    MR BRAIN WO CONTRAST Result Date: 05/03/2023 CLINICAL DATA:  Neuro deficit, acute, stroke suspected. EXAM: MRI HEAD WITHOUT CONTRAST TECHNIQUE: Multiplanar, multiecho pulse sequences of the brain and surrounding structures were obtained without intravenous contrast. COMPARISON:  CT head from today. FINDINGS: Brain: Multiple small and ill-defined acute or early subacute infarcts in the high right posterior frontal perirolandic area (see series 5, images 82 through 88). No acute hemorrhage, hydrocephalus, extra-axial collection or mass lesion. Cerebral atrophy with prominence of the extra-axial spaces. Small remote left cerebellar infarct. Vascular: Major arterial flow voids are maintained Skull and upper cervical spine: Normal marrow signal. Sinuses/Orbits: Mostly clear  sinuses.  No acute orbital findings. IMPRESSION: 1. Multiple small and ill-defined acute or early subacute infarcts in the high right posterior frontal perirolandic area. 2.  Cerebral Atrophy (ICD10-G31.9). Electronically Signed   By: Feliberto Harts M.D.   On: 05/03/2023 20:15   MR CERVICAL SPINE WO CONTRAST Result Date: 05/03/2023 CLINICAL DATA:  Myelopathy, acute, cervical spine. Bilateral arm weakness EXAM: MRI CERVICAL SPINE WITHOUT CONTRAST TECHNIQUE: Multiplanar, multisequence MR imaging of the cervical spine was performed. No intravenous contrast was administered. COMPARISON:  Same day CTA FINDINGS: Alignment: Straightening of the cervical lordosis. No significant listhesis. Vertebrae: No fracture, evidence of discitis, or bone lesion. Cord: Normal signal and morphology. Posterior Fossa, vertebral arteries, paraspinal tissues: Negative. Disc levels: C2-C3: Mid disc protrusion. Moderate right facet arthropathy. No foraminal or canal stenosis. C3-C4: Disc osteophyte complex with bilateral facet and uncovertebral arthropathy. Moderate canal stenosis with moderate bilateral foraminal stenosis. C4-C5: Disc osteophyte complex with bilateral facet and uncovertebral arthropathy. Mild-moderate canal stenosis with moderate-severe left and mild-moderate right foraminal stenosis. C5-C6: Disc osteophyte complex with bilateral facet and uncovertebral arthropathy. Mild canal stenosis with moderate right foraminal stenosis. C6-C7: Mild disc bulge and facet hypertrophy without foraminal or canal stenosis. C7-T1: Disc bulge and endplate spurring without foraminal or canal stenosis. IMPRESSION: 1. Multilevel cervical spondylosis, most pronounced at the C3-4 level where there is moderate canal stenosis and moderate bilateral foraminal stenosis. 2. Mild-moderate canal stenosis at C4-5 with moderate-severe left and mild-moderate right foraminal stenosis. 3. Mild canal stenosis and moderate right foraminal stenosis at C5-6.  Electronically Signed   By: Duanne Guess D.O.   On: 05/03/2023 20:11   CT ANGIO HEAD NECK W WO CM (CODE STROKE) Result Date: 05/03/2023 CLINICAL DATA:  History: Neuro deficit, acute, stroke suspected EXAM: CT ANGIOGRAPHY HEAD AND NECK WITH AND WITHOUT CONTRAST TECHNIQUE: Multidetector CT imaging of the head and neck was performed using the standard protocol during bolus administration of intravenous contrast. Multiplanar CT image reconstructions and MIPs were obtained to evaluate the vascular anatomy. Carotid stenosis measurements (when applicable) are obtained  utilizing NASCET criteria, using the distal internal carotid diameter as the denominator. RADIATION DOSE REDUCTION: This exam was performed according to the departmental dose-optimization program which includes automated exposure control, adjustment of the mA and/or kV according to patient size and/or use of iterative reconstruction technique. CONTRAST:  Administered contrast not known at this time. COMPARISON:  Noncontrast head CT performed earlier today 05/03/2023. FINDINGS: CTA NECK FINDINGS Aortic arch: Standard aortic branching. Atherosclerotic plaque within the visualized thoracic aorta and proximal major branch vessels of the neck. Streak/beam hardening artifact arising from a dense contrast bolus partially obscures the right subclavian artery. Within this limitation, there is no appreciable hemodynamically significant innominate or proximal subclavian artery stenosis. Right carotid system: CCA and ICA patent within the neck. Atherosclerotic plaque within the distal CCA, about the carotid bifurcation and within the proximal ICA. Atherosclerotic plaque within the proximal ICA results in 50% stenosis. Left carotid system: CCA and ICA patent within the neck without hemodynamically significant stenosis (50% or greater). Atherosclerotic plaque at the CCA origin, about the carotid bifurcation and within the proximal ICA. Most notably, moderate  atherosclerotic plaque is present about the carotid bifurcation and within the proximal ICA. Vertebral arteries: Patent within the neck. Venous reflux of contrast precludes accurate assessment of the right vertebral artery V1 segment. Elsewhere, there is atherosclerotic plaque within the right vertebral artery at the V3/V4 junction with mild stenosis. Atherosclerotic plaque at the left vertebral artery origin with mild/moderate stenosis at this site. Skeleton: Prior median sternotomy. Spondylosis of the cervical and visualized upper thoracic levels. Other neck: No neck mass or cervical lymphadenopathy. Upper chest: No consolidation within the imaged lung apices. Review of the MIP images confirms the above findings CTA HEAD FINDINGS Anterior circulation: The intracranial internal carotid arteries are patent. Atherosclerotic plaque within both vessels. Moderate/severe stenosis of the paraclinoid right ICA. No more than mild stenosis elsewhere within the intracranial internal carotid arteries. The M1 middle cerebral arteries are patent. Atherosclerotic irregularity of the M2 and more distal MCA vessels, bilaterally. No M2 proximal branch occlusion or high-grade proximal stenosis. The anterior cerebral arteries are patent. The right ACA A1 segment is developmentally absent. No intracranial aneurysm is identified. Posterior circulation: The intracranial vertebral arteries are patent. Atherosclerotic plaque within the right vertebral artery at the V3/V4 junction with mild stenosis. The basilar artery is patent. The posterior cerebral arteries are patent. Hypoplastic right P1 segment. A right posterior communicating artery is present. The left posterior communicating artery is diminutive or absent. Venous sinuses: Within the limitations of contrast timing, no convincing thrombus. Anatomic variants: As described. Review of the MIP images confirms the above findings No emergent large vessel occlusion identified. These  results were communicated to [facility] at 4:58 pmon 2/14/2025by text page via the [facility] messaging system. IMPRESSION: CTA neck: 1. The common carotid and internal carotid arteries are patent within the neck. Atherosclerotic plaque bilaterally, as described. Most notably, atherosclerotic plaque within the proximal right ICA results in 50% stenosis. 2. The vertebral arteries are patent within the neck. Venous reflux of contrast precludes accurate assessment of the right vertebral artery V1 segment. Atherosclerotic plaque within the right vertebral artery at the V3/V4 junction with mild stenosis. Atherosclerotic plaque at the left vertebral artery origin with mild/moderate stenosis. 3.  Aortic Atherosclerosis (ICD10-I70.0). CTA head: 1. No proximal intracranial large vessel occlusion or high-grade proximal arterial stenosis identified. 2. Intracranial atherosclerotic disease with multifocal stenoses. Most notably, there is a moderate/severe stenosis within the paraclinoid right internal carotid artery. Electronically Signed  By: Jackey Loge D.O.   On: 05/03/2023 17:04   CT HEAD CODE STROKE WO CONTRAST Result Date: 05/03/2023 CLINICAL DATA:  Code stroke. Neuro deficit, acute, stroke suspected. EXAM: CT HEAD WITHOUT CONTRAST TECHNIQUE: Contiguous axial images were obtained from the base of the skull through the vertex without intravenous contrast. RADIATION DOSE REDUCTION: This exam was performed according to the departmental dose-optimization program which includes automated exposure control, adjustment of the mA and/or kV according to patient size and/or use of iterative reconstruction technique. COMPARISON:  None. FINDINGS: Brain: Generalized cerebral atrophy. Commensurate prominence of the ventricles and sulci. Patchy and ill-defined hypoattenuation within the cerebral white matter, nonspecific but compatible with mild chronic small vessel ischemic disease. There is no acute intracranial hemorrhage. No  demarcated cortical infarct. No extra-axial fluid collection. No evidence of an intracranial mass. No midline shift. Vascular: No hyperdense vessel.  Atherosclerotic calcifications. Skull: No calvarial fracture or aggressive osseous lesion. Sinuses/Orbits: No mass or acute finding within the imaged orbits. Trace mucosal thickening within the left maxillary sinus at the imaged levels. Minimal mucosal thickening scattered within the bilateral ethmoid and frontal sinuses. ASPECTS [facility] Stroke Program Early CT Score) - Ganglionic level infarction (caudate, lentiform nuclei, internal capsule, insula, M1-M3 cortex): 7 - Supraganglionic infarction (M4-M6 cortex): 3 Total score (0-10 with 10 being normal): 10 No evidence of an acute intracranial abnormality. These results were communicated to [facility] at 4:34 pmon 2/14/2025by text page via the [facility] messaging system. IMPRESSION: 1. No evidence of an acute intracranial abnormality. 2. Parenchymal atrophy and chronic small vessel ischemic disease. 3. Mild paranasal sinus disease at the imaged levels, for as described. Electronically Signed   By: Jackey Loge D.O.   On: 05/03/2023 16:36    Microbiology: Results for orders placed or performed during the hospital encounter of 07/26/22  Blood culture (routine x 2)     Status: None   Collection Time: 07/26/22  9:21 PM   Specimen: BLOOD  Result Value Ref Range Status   Specimen Description   Final    BLOOD BLOOD RIGHT FOREARM Performed at [facility], [address] [place], [place] [address]    Special Requests   Final    BOTTLES DRAWN AEROBIC AND ANAEROBIC Blood Culture adequate volume Performed at [facility], [address] [place], [place] [address]    Culture   Final    NO GROWTH 5 DAYS Performed at [facility], [address] [place], [place] [address]    Report Status 08/01/2022 FINAL  Final  Blood culture (routine x 2)     Status: None    Collection Time: 07/27/22  2:26 AM   Specimen: BLOOD LEFT ARM  Result Value Ref Range Status   Specimen Description BLOOD LEFT ARM  Final   Special Requests   Final    BOTTLES DRAWN AEROBIC AND ANAEROBIC Blood Culture results may not be optimal due to an excessive volume of blood received in culture bottles   Culture   Final    NO GROWTH 5 DAYS Performed at [facility], [address], [place], [place] [address]    Report Status 08/01/2022 FINAL  Final    Labs: CBC: Recent Labs  Lab 05/03/23 1618 05/03/23 1631 05/04/23 0554  WBC 7.0  --  7.1  NEUTROABS 5.2  --   --   HGB 15.2 15.6 14.2  HCT 46.2 46.0 43.1  MCV 90.6  --  88.9  PLT 102*  --  92*   Basic Metabolic Panel: Recent Labs  Lab 05/03/23 1618 05/03/23 1631 05/04/23 0554  NA 139 140 139  K 3.4* 3.4* 3.7  CL 98 100 105  CO2 27  --  23  GLUCOSE 110* 106* 113*  BUN 36* 39* 35*  CREATININE 2.52* 2.70* 2.31*  CALCIUM 9.4  --  9.0   Liver Function Tests: Recent Labs  Lab 05/03/23 1618  AST 32  ALT 28  ALKPHOS 53  BILITOT 0.8  PROT 6.8  ALBUMIN 4.1   CBG: Recent Labs  Lab 05/03/23 1618  GLUCAP 101*    Discharge time spent: greater than 30 minutes.  Signed: Cailen Texeira, DO [facility] 05/04/2023

## 2023-05-04 NOTE — Evaluation (Signed)
Occupational Therapy Evaluation Patient Details Name: [name] MRN: [id number] DOB: [birth year] Today's Date: 05/04/2023   History of Present Illness   89 y.o. male who presented to the ED for evaluation of left-sided weakness. MRI showed multiple small and ill-defined acute or early subacute infarcts in the high right posterior frontal lobe; MRI C spine  showed multilevel cervical spondylosis, most pronounced at C3-4 where there is moderate canal stenosis and moderate bilateral foraminal stenosis.  PMH significant for CAD s/p CABG, permanent A-fib on Eliquis, HFpEF, s/p TAVR and mitral valve repair, CKD stage IV, hypothyroidism, and chronic thrombocytopenia     Clinical Impressions PTA pt lived with spouse and reports being ind in mobility and ADLs, spouse actually does provide setup assist for medications. Pt currently a bit unsteady and benefits from UE support, ambulating in hall with CGA + RW but needs occasional cue to maintain BOS inside of RW when turning. BUEs WFL although L is weaker than R. Pt able to complete ADLs with CGA to setup assist, would benefit from reinforced education on donning toe lifter. He verbalized understanding of [employer] stroke education. OT to continue following pt acutely to reinforce toe lifter education and safe RW use before DC. No post acute OT needs identified, follow-up with an orthotist      If plan is discharge home, recommend the following:   Assistance with cooking/housework     Functional Status Assessment   Patient has had a recent decline in their functional status and demonstrates the ability to make significant improvements in function in a reasonable and predictable amount of time.     Equipment Recommendations   Other (comment) (RW)     Recommendations for Other Services         Precautions/Restrictions   Precautions Precautions: Fall Precaution/Restrictions Comments: Educated pt on fall risk and potential  consequences Other Brace: Left toe lifter Restrictions Weight Bearing Restrictions Per Provider Order: No     Mobility Bed Mobility Overal bed mobility: Needs Assistance Bed Mobility: Sit to Supine       Sit to supine: Independent   General bed mobility comments: in recliner on arrival    Transfers Overall transfer level: Needs assistance Equipment used: None Transfers: Sit to/from Stand Sit to Stand: Supervision                  Balance Overall balance assessment: Needs assistance Sitting-balance support: No upper extremity supported, Feet supported Sitting balance-Leahy Scale: Normal     Standing balance support: No upper extremity supported Standing balance-Leahy Scale: Fair                             ADL either performed or assessed with clinical judgement   ADL Overall ADL's : Needs assistance/impaired Eating/Feeding: Independent;Sitting   Grooming: Standing;Contact guard assist   Upper Body Bathing: Standing;Contact guard assist   Lower Body Bathing: Set up;Sitting/lateral leans   Upper Body Dressing : Set up;Sitting   Lower Body Dressing: Supervision/safety;Sitting/lateral leans;Set up Lower Body Dressing Details (indicate cue type and reason): Educated pt on the setup of his toe lifter, he gathers most parts but would need some help with adjusting the buckle. Would benefit from more education on setup with it. Toilet Transfer: Contact guard assist;Ambulation;Rolling walker (2 wheels)   Toileting- Clothing Manipulation and Hygiene: Sit to/from stand;Contact guard assist       Functional mobility during ADLs: Contact guard assist;Rolling walker (2 wheels)  Vision         Perception Perception: Within Functional Limits       Praxis Praxis: WFL       Pertinent Vitals/Pain Pain Assessment Pain Assessment: No/denies pain     Extremity/Trunk Assessment Upper Extremity Assessment Upper Extremity Assessment: LUE  deficits/detail;Overall [facility] for tasks assessed;Right hand dominant LUE Deficits / Details: LUE shoulder flexion MMT 3+/5 but elbow flexors 4/5. ROM WFL, Coorindation intact   Lower Extremity Assessment Lower Extremity Assessment: LLE deficits/detail LLE Deficits / Details: AROM WFL; knee extension 4/5, ankle DF 4/5   Cervical / Trunk Assessment Cervical / Trunk Assessment: Kyphotic   Communication Communication Communication: Impaired Factors Affecting Communication: Hearing impaired (hears better in L ear)   Cognition Arousal: Alert Behavior During Therapy: WFL for tasks assessed/performed Cognition: No apparent impairments             OT - Cognition Comments: Performed Mini Cog with pt, he reports that he has STM deficits and baseline and 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 Pt scored 3/5 on the Mini cog and mainly did poorly on STM section as anticipated, he was able to self correct the labels of his clock draw prn. deferred medi-cog as pt spouse sets up his medications for him.                 Following commands: Intact       Cueing  General Comments   Cueing Techniques: Verbal cues;Visual cues      Exercises     Shoulder Instructions      Home Living Family/patient expects to be discharged to:: Private residence Living Arrangements: Spouse/significant other Available Help at Discharge: Available 24 hours/day Type of Home: House Home Access: Stairs to enter [employer] of Steps: 3 Entrance Stairs-Rails: Left Home Layout: One level     Bathroom Shower/Tub: Producer, television/film/video: Standard     Home Equipment: None          Prior Functioning/Environment Prior Level of Function : Independent/Modified Independent;Driving             Mobility Comments: guides wife HHA due to her poor balance (she also uses RW at times) ADLs Comments: ind    OT Problem List: Impaired balance (sitting and/or standing)   OT  Treatment/Interventions: Self-care/ADL training;DME and/or AE instruction;Therapeutic activities;Balance training;Therapeutic exercise;Patient/family education      OT Goals(Current goals can be found in the care plan section)   Acute Rehab OT Goals Patient Stated Goal: To go home OT Goal Formulation: With patient Time For Goal Achievement: 05/18/23 Potential to Achieve Goals: Good ADL Goals Pt Will Perform Grooming: with modified independence;standing Pt Will Perform Lower Body Bathing: with modified independence;sit to/from stand Pt Will Perform Lower Body Dressing: with modified independence;sit to/from stand Pt Will Perform Toileting - Clothing Manipulation and hygiene: with modified independence;sit to/from stand   OT Frequency:  Min 1X/week    Co-evaluation              AM-PAC OT "6 Clicks" Daily Activity     Outcome Measure Help from another person eating meals?: None Help from another person taking care of personal grooming?: A Little Help from another person toileting, which includes using toliet, bedpan, or urinal?: A Little Help from another person bathing (including washing, rinsing, drying)?: A Little Help from another person to put on and taking off regular upper body clothing?: A Little Help from another person to put on and taking  off regular lower body clothing?: A Little 6 Click Score: 19   End of Session Equipment Utilized During Treatment: Gait belt;Rolling walker (2 wheels) Nurse Communication: Mobility status  Activity Tolerance: Patient tolerated treatment well Patient left: in bed;with call bell/phone within reach;with bed alarm set  OT Visit Diagnosis: Other abnormalities of gait and mobility (R26.89);Muscle weakness (generalized) (M62.81)                Time: 6761-9509 OT Time Calculation (min): 29 min Charges:  OT General Charges $OT Visit: 1 Visit OT Evaluation $OT Eval Low Complexity: 1 Low OT Treatments $Therapeutic Activity: 8-22  mins  05/04/2023  AB, OTR/L  Acute Rehabilitation Services  Office: [phone]   Tristan Schroeder 05/04/2023, 10:39 AM

## 2023-05-04 NOTE — Progress Notes (Signed)
Patient called for this nurse, upon arriving to the room patient exclaimed "I am healed!" And raised both arms then raised his left leg without drift noted. Patient is ecstatic about improvement.

## 2023-05-04 NOTE — Evaluation (Signed)
Physical Therapy Evaluation Patient Details Name: [name] MRN: [id number] DOB: 06-Aug-1931 Today's Date: 05/04/2023  History of Present Illness  89 y.o. male who presented to the ED for evaluation of left-sided weakness. MRI showed multiple small and ill-defined acute or early subacute infarcts in the high right posterior frontal lobe; MRI C spine  showed multilevel cervical spondylosis, most pronounced at C3-4 where there is moderate canal stenosis and moderate bilateral foraminal stenosis.  PMH significant for CAD s/p CABG, permanent A-fib on Eliquis, HFpEF, s/p TAVR and mitral valve repair, CKD stage IV, hypothyroidism, and chronic thrombocytopenia  Clinical Impression   Pt admitted secondary to problem above with deficits below. PTA patient was completely independent and occasionally assisting wife with her gait, however wife also uses RW. Pt currently requires min assist for ambulation due to LLE weakness. Issued L toe-lifter to use with his no-laces shoes. Will need an outpatient orthotist consult for an AFO (in-house foot up brace will not work for pt as his shoes do not have laces). Will also need education on use of RW prior to discharge (OT and nursing made aware).  Anticipate patient will benefit from PT to address problems listed below.Will continue to follow acutely to maximize functional mobility independence and safety. Will plan to see 2/16 for additional education prior to discharge.            If plan is discharge home, recommend the following: A little help with walking and/or transfers;Assistance with cooking/housework   Can travel by private vehicle        Equipment Recommendations Rolling walker (2 wheels);Other (comment) (orthotist consult for L AFO)  Recommendations for Other Services  OT consult    Functional Status Assessment Patient has had a recent decline in their functional status and demonstrates the ability to make significant improvements in function in  a reasonable and predictable amount of time.     Precautions / Restrictions Precautions Precautions: Fall Precaution/Restrictions Comments: Educated pt on fall risk and potential consequences Required Braces or Orthoses: Other Brace Other Brace: Left toe lifter      Mobility  Bed Mobility Overal bed mobility: Independent             General bed mobility comments: HOB flat and no rails    Transfers Overall transfer level: Needs assistance Equipment used: None Transfers: Sit to/from Stand Sit to Stand: Supervision           General transfer comment: for safety    Ambulation/Gait Ambulation/Gait assistance: Min assist Gait Distance (Feet): 200 Feet (hospital socks; 150 shoes, 150 shoes with toe lifter) Assistive device: None Gait Pattern/deviations: Step-through pattern, Decreased step length - left, Decreased stance time - right, Decreased dorsiflexion - left, Knee flexed in stance - left, Shuffle, Drifts right/left, Trunk flexed   Gait velocity interpretation: 1.31 - 2.62 ft/sec, indicative of limited community ambulator   General Gait Details: catching left foot with near imbalance on multiple occasions; with cues for heelstrike, able to clear L foot, but quickly falls back into catching L foot; same with his shoes on; slightly improved with toe lifter and shoes, however not completely resolved; drifts to his left; tactile cues for upright posture and prevent forward fall when L foot catches; verbal cues for L heelstrike  Stairs Stairs: Yes Stairs assistance: Supervision Stair Management: One rail Left, Alternating pattern, Step to pattern, Forwards Number of Stairs: 2 General stair comments: alternating ascend; step-to descend; no buckling of LLE when leads on descent  Wheelchair  Mobility     Tilt Bed    Modified Rankin (Stroke Patients Only) Modified Rankin (Stroke Patients Only) Pre-Morbid Rankin Score: No symptoms Modified Rankin: Moderately severe  disability     Balance Overall balance assessment: Needs assistance Sitting-balance support: No upper extremity supported, Feet supported Sitting balance-Leahy Scale: Normal Sitting balance - Comments: able to don shoes (reaching to feet)   Standing balance support: No upper extremity supported Standing balance-Leahy Scale: Fair                   Standardized Balance Assessment Standardized Balance Assessment : Berg Balance Test Berg Balance Test Sit to Stand: Able to stand  independently using hands Standing Unsupported: Able to stand safely 2 minutes Sitting with Back Unsupported but Feet Supported on Floor or Stool: Able to sit safely and securely 2 minutes Stand to Sit: Sits safely with minimal use of hands Transfers: Able to transfer with verbal cueing and /or supervision Standing Unsupported, Alternately Place Feet on Step/Stool: Able to complete >2 steps/needs minimal assist         Pertinent Vitals/Pain Pain Assessment Pain Assessment: No/denies pain    Home Living Family/patient expects to be discharged to:: Private residence Living Arrangements: Spouse/significant other Available Help at Discharge: Family;Available 24 hours/day (can only provide supervision; she holds his hand when she walks) Type of Home: House Home Access: Stairs to enter Entrance Stairs-Rails: Left Entrance Stairs-Number of Steps: 3   Home Layout: One level Home Equipment: None      Prior Function Prior Level of Function : Independent/Modified Independent;Driving             Mobility Comments: guides wife HHA due to her poor balance (she also uses RW at times)       Extremity/Trunk Assessment   Upper Extremity Assessment Upper Extremity Assessment: Defer to OT evaluation    Lower Extremity Assessment Lower Extremity Assessment: LLE deficits/detail LLE Deficits / Details: AROM WFL; knee extension 4/5, ankle DF 4/5    Cervical / Trunk Assessment Cervical / Trunk  Assessment: Kyphotic  Communication   Communication Communication: Impaired Factors Affecting Communication: Hearing impaired (hears better on left)    Cognition Arousal: Alert Behavior During Therapy: WFL for tasks assessed/performed   PT - Cognitive impairments: No apparent impairments                         Following commands: Intact       Cueing Cueing Techniques: Verbal cues, Visual cues     General Comments      Exercises     Assessment/Plan    PT Assessment Patient needs continued PT services  PT Problem List Decreased strength;Decreased activity tolerance;Decreased balance;Decreased mobility;Decreased knowledge of use of DME       PT Treatment Interventions DME instruction;Gait training;Stair training;Functional mobility training;Therapeutic activities;Therapeutic exercise;Balance training;Neuromuscular re-education;Patient/family education    PT Goals (Current goals can be found in the Care Plan section)  Acute Rehab PT Goals Patient Stated Goal: be able to walk without a device without falling PT Goal Formulation: With patient Time For Goal Achievement: 05/18/23 Potential to Achieve Goals: Good    Frequency Min 1X/week     Co-evaluation               AM-PAC PT "6 Clicks" Mobility  Outcome Measure Help needed turning from your back to your side while in a flat bed without using bedrails?: None Help needed moving from lying on your back  to sitting on the side of a flat bed without using bedrails?: None Help needed moving to and from a bed to a chair (including a wheelchair)?: A Little Help needed standing up from a chair using your arms (e.g., wheelchair or bedside chair)?: A Little Help needed to walk in hospital room?: A Little Help needed climbing 3-5 steps with a railing? : A Little 6 Click Score: 20    End of Session Equipment Utilized During Treatment: Gait belt Activity Tolerance: Patient tolerated treatment well Patient  left: in chair;with call bell/phone within reach;Other (comment) (fall risk=8; pt able to verbalize to call if he wants to get up to reduce fall risk) Nurse Communication: Mobility status;Need for lift equipment;Other (comment) (rec use RW (and discussed with pt); currently no chair alarm) PT Visit Diagnosis: Other abnormalities of gait and mobility (R26.89);Hemiplegia and hemiparesis Hemiplegia - Right/Left: Left Hemiplegia - dominant/non-dominant: Non-dominant Hemiplegia - caused by: Cerebral infarction    Time: 0801-0845 PT Time Calculation (min) (ACUTE ONLY): 44 min   Charges:   PT Evaluation $PT Eval Low Complexity: 1 Low PT Treatments $Gait Training: 23-37 mins PT General Charges $$ ACUTE PT VISIT: 1 Visit          Jerolyn Center, PT Acute Rehabilitation Services  Office [phone]   Zena Amos 05/04/2023, 9:05 AM

## 2023-05-04 NOTE — Progress Notes (Signed)
Echocardiogram 2D Echocardiogram has been performed.  [name] 05/04/2023, 1:49 PM

## 2023-05-04 NOTE — Progress Notes (Addendum)
STROKE TEAM PROGRESS NOTE   INTERIM HISTORY/SUBJECTIVE Patient is lying comfortably in bed.  He states his neurological exam is much improved.  Patient states he was taking only Eliquis half dose patient has remained hemodynamically stable and afebrile overnight.  He is able to move all 4 extremities well, and NIHSS is 0 today. MRI scan shows   small right frontal multiple right posterior frontal perirolandic and subcortical infarcts.  CT angiogram shows 50% proximal right ICA as well as cavernous carotid stenosis OBJECTIVE  CBC    Component Value Date/Time   WBC 7.1 05/04/2023 0554   RBC 4.85 05/04/2023 0554   HGB 14.2 05/04/2023 0554   HGB 13.0 06/04/2019 1405   HCT 43.1 05/04/2023 0554   HCT 40.3 06/04/2019 1405   PLT 92 (L) 05/04/2023 0554   PLT 87 (LL) 06/04/2019 1405   MCV 88.9 05/04/2023 0554   MCV 97 06/04/2019 1405   MCH 29.3 05/04/2023 0554   MCHC 32.9 05/04/2023 0554   RDW 14.5 05/04/2023 0554   RDW 13.6 06/04/2019 1405   LYMPHSABS 1.0 05/03/2023 1618   LYMPHSABS 0.7 04/24/2019 1303   MONOABS 0.6 05/03/2023 1618   EOSABS 0.2 05/03/2023 1618   EOSABS 0.2 04/24/2019 1303   BASOSABS 0.1 05/03/2023 1618   BASOSABS 0.0 04/24/2019 1303    BMET    Component Value Date/Time   NA 139 05/04/2023 0554   NA 142 06/25/2019 1531   K 3.7 05/04/2023 0554   CL 105 05/04/2023 0554   CO2 23 05/04/2023 0554   GLUCOSE 113 (H) 05/04/2023 0554   BUN 35 (H) 05/04/2023 0554   BUN 63 (H) 06/25/2019 1531   CREATININE 2.31 (H) 05/04/2023 0554   CREATININE 1.80 (H) 12/23/2015 0742   CALCIUM 9.0 05/04/2023 0554   GFRNONAA 26 (L) 05/04/2023 0554    IMAGING past 24 hours MR BRAIN WO CONTRAST Result Date: 05/03/2023 CLINICAL DATA:  Neuro deficit, acute, stroke suspected. EXAM: MRI HEAD WITHOUT CONTRAST TECHNIQUE: Multiplanar, multiecho pulse sequences of the brain and surrounding structures were obtained without intravenous contrast. COMPARISON:  CT head from today. FINDINGS: Brain:  Multiple small and ill-defined acute or early subacute infarcts in the high right posterior frontal perirolandic area (see series 5, images 82 through 88). No acute hemorrhage, hydrocephalus, extra-axial collection or mass lesion. Cerebral atrophy with prominence of the extra-axial spaces. Small remote left cerebellar infarct. Vascular: Major arterial flow voids are maintained Skull and upper cervical spine: Normal marrow signal. Sinuses/Orbits: Mostly clear sinuses.  No acute orbital findings. IMPRESSION: 1. Multiple small and ill-defined acute or early subacute infarcts in the high right posterior frontal perirolandic area. 2.  Cerebral Atrophy (ICD10-G31.9). Electronically Signed   By: Feliberto Harts M.D.   On: 05/03/2023 20:15   MR CERVICAL SPINE WO CONTRAST Result Date: 05/03/2023 CLINICAL DATA:  Myelopathy, acute, cervical spine. Bilateral arm weakness EXAM: MRI CERVICAL SPINE WITHOUT CONTRAST TECHNIQUE: Multiplanar, multisequence MR imaging of the cervical spine was performed. No intravenous contrast was administered. COMPARISON:  Same day CTA FINDINGS: Alignment: Straightening of the cervical lordosis. No significant listhesis. Vertebrae: No fracture, evidence of discitis, or bone lesion. Cord: Normal signal and morphology. Posterior Fossa, vertebral arteries, paraspinal tissues: Negative. Disc levels: C2-C3: Mid disc protrusion. Moderate right facet arthropathy. No foraminal or canal stenosis. C3-C4: Disc osteophyte complex with bilateral facet and uncovertebral arthropathy. Moderate canal stenosis with moderate bilateral foraminal stenosis. C4-C5: Disc osteophyte complex with bilateral facet and uncovertebral arthropathy. Mild-moderate canal stenosis with moderate-severe left and mild-moderate  right foraminal stenosis. C5-C6: Disc osteophyte complex with bilateral facet and uncovertebral arthropathy. Mild canal stenosis with moderate right foraminal stenosis. C6-C7: Mild disc bulge and facet  hypertrophy without foraminal or canal stenosis. C7-T1: Disc bulge and endplate spurring without foraminal or canal stenosis. IMPRESSION: 1. Multilevel cervical spondylosis, most pronounced at the C3-4 level where there is moderate canal stenosis and moderate bilateral foraminal stenosis. 2. Mild-moderate canal stenosis at C4-5 with moderate-severe left and mild-moderate right foraminal stenosis. 3. Mild canal stenosis and moderate right foraminal stenosis at C5-6. Electronically Signed   By: Duanne Guess D.O.   On: 05/03/2023 20:11   CT ANGIO HEAD NECK W WO CM (CODE STROKE) Result Date: 05/03/2023 CLINICAL DATA:  History: Neuro deficit, acute, stroke suspected EXAM: CT ANGIOGRAPHY HEAD AND NECK WITH AND WITHOUT CONTRAST TECHNIQUE: Multidetector CT imaging of the head and neck was performed using the standard protocol during bolus administration of intravenous contrast. Multiplanar CT image reconstructions and MIPs were obtained to evaluate the vascular anatomy. Carotid stenosis measurements (when applicable) are obtained utilizing NASCET criteria, using the distal internal carotid diameter as the denominator. RADIATION DOSE REDUCTION: This exam was performed according to the departmental dose-optimization program which includes automated exposure control, adjustment of the mA and/or kV according to patient size and/or use of iterative reconstruction technique. CONTRAST:  Administered contrast not known at this time. COMPARISON:  Noncontrast head CT performed earlier today 05/03/2023. FINDINGS: CTA NECK FINDINGS Aortic arch: Standard aortic branching. Atherosclerotic plaque within the visualized thoracic aorta and proximal major branch vessels of the neck. Streak/beam hardening artifact arising from a dense contrast bolus partially obscures the right subclavian artery. Within this limitation, there is no appreciable hemodynamically significant innominate or proximal subclavian artery stenosis. Right carotid  system: CCA and ICA patent within the neck. Atherosclerotic plaque within the distal CCA, about the carotid bifurcation and within the proximal ICA. Atherosclerotic plaque within the proximal ICA results in 50% stenosis. Left carotid system: CCA and ICA patent within the neck without hemodynamically significant stenosis (50% or greater). Atherosclerotic plaque at the CCA origin, about the carotid bifurcation and within the proximal ICA. Most notably, moderate atherosclerotic plaque is present about the carotid bifurcation and within the proximal ICA. Vertebral arteries: Patent within the neck. Venous reflux of contrast precludes accurate assessment of the right vertebral artery V1 segment. Elsewhere, there is atherosclerotic plaque within the right vertebral artery at the V3/V4 junction with mild stenosis. Atherosclerotic plaque at the left vertebral artery origin with mild/moderate stenosis at this site. Skeleton: Prior median sternotomy. Spondylosis of the cervical and visualized upper thoracic levels. Other neck: No neck mass or cervical lymphadenopathy. Upper chest: No consolidation within the imaged lung apices. Review of the MIP images confirms the above findings CTA HEAD FINDINGS Anterior circulation: The intracranial internal carotid arteries are patent. Atherosclerotic plaque within both vessels. Moderate/severe stenosis of the paraclinoid right ICA. No more than mild stenosis elsewhere within the intracranial internal carotid arteries. The M1 middle cerebral arteries are patent. Atherosclerotic irregularity of the M2 and more distal MCA vessels, bilaterally. No M2 proximal branch occlusion or high-grade proximal stenosis. The anterior cerebral arteries are patent. The right ACA A1 segment is developmentally absent. No intracranial aneurysm is identified. Posterior circulation: The intracranial vertebral arteries are patent. Atherosclerotic plaque within the right vertebral artery at the V3/V4 junction with  mild stenosis. The basilar artery is patent. The posterior cerebral arteries are patent. Hypoplastic right P1 segment. A right posterior communicating artery is present. The  left posterior communicating artery is diminutive or absent. Venous sinuses: Within the limitations of contrast timing, no convincing thrombus. Anatomic variants: As described. Review of the MIP images confirms the above findings No emergent large vessel occlusion identified. These results were communicated to [facility] at 4:58 pmon 2/14/2025by text page via the [facility] messaging system. IMPRESSION: CTA neck: 1. The common carotid and internal carotid arteries are patent within the neck. Atherosclerotic plaque bilaterally, as described. Most notably, atherosclerotic plaque within the proximal right ICA results in 50% stenosis. 2. The vertebral arteries are patent within the neck. Venous reflux of contrast precludes accurate assessment of the right vertebral artery V1 segment. Atherosclerotic plaque within the right vertebral artery at the V3/V4 junction with mild stenosis. Atherosclerotic plaque at the left vertebral artery origin with mild/moderate stenosis. 3.  Aortic Atherosclerosis (ICD10-I70.0). CTA head: 1. No proximal intracranial large vessel occlusion or high-grade proximal arterial stenosis identified. 2. Intracranial atherosclerotic disease with multifocal stenoses. Most notably, there is a moderate/severe stenosis within the paraclinoid right internal carotid artery. Electronically Signed   By: Jackey Loge D.O.   On: 05/03/2023 17:04   CT HEAD CODE STROKE WO CONTRAST Result Date: 05/03/2023 CLINICAL DATA:  Code stroke. Neuro deficit, acute, stroke suspected. EXAM: CT HEAD WITHOUT CONTRAST TECHNIQUE: Contiguous axial images were obtained from the base of the skull through the vertex without intravenous contrast. RADIATION DOSE REDUCTION: This exam was performed according to the departmental dose-optimization program which includes  automated exposure control, adjustment of the mA and/or kV according to patient size and/or use of iterative reconstruction technique. COMPARISON:  None. FINDINGS: Brain: Generalized cerebral atrophy. Commensurate prominence of the ventricles and sulci. Patchy and ill-defined hypoattenuation within the cerebral white matter, nonspecific but compatible with mild chronic small vessel ischemic disease. There is no acute intracranial hemorrhage. No demarcated cortical infarct. No extra-axial fluid collection. No evidence of an intracranial mass. No midline shift. Vascular: No hyperdense vessel.  Atherosclerotic calcifications. Skull: No calvarial fracture or aggressive osseous lesion. Sinuses/Orbits: No mass or acute finding within the imaged orbits. Trace mucosal thickening within the left maxillary sinus at the imaged levels. Minimal mucosal thickening scattered within the bilateral ethmoid and frontal sinuses. ASPECTS [facility] Stroke Program Early CT Score) - Ganglionic level infarction (caudate, lentiform nuclei, internal capsule, insula, M1-M3 cortex): 7 - Supraganglionic infarction (M4-M6 cortex): 3 Total score (0-10 with 10 being normal): 10 No evidence of an acute intracranial abnormality. These results were communicated to [facility] at 4:34 pmon 2/14/2025by text page via the [facility] messaging system. IMPRESSION: 1. No evidence of an acute intracranial abnormality. 2. Parenchymal atrophy and chronic small vessel ischemic disease. 3. Mild paranasal sinus disease at the imaged levels, for as described. Electronically Signed   By: Jackey Loge D.O.   On: 05/03/2023 16:36    Vitals:   05/03/23 2211 05/04/23 0306 05/04/23 0729 05/04/23 1123  BP: (!) 149/88 (!) 158/81 (!) 145/88 126/70  Pulse: 96 76 82 100  Resp: 18 18 17 18   Temp: 98.9 F (37.2 C) (!) 97.3 F (36.3 C) 97.7 F (36.5 C) 97.8 F (36.6 C)  TempSrc: Oral Oral Oral Oral  SpO2: 100% 98% 100% 98%  Weight: 80.1 kg     Height: 5' 7.99" (1.727 m)         PHYSICAL EXAM General:  Alert, well-nourished, well-developed patient in no acute distress Psych:  Mood and affect appropriate for situation Respiratory:  Regular, unlabored respirations on room air   NEURO:  Mental Status: AA&Ox3,  patient is able to give clear and coherent history Speech/Language: speech is without dysarthria or aphasia.    Cranial Nerves:  II: PERRL.  III, IV, VI: EOMI. Eyelids elevate symmetrically.  V: Sensation is intact to light touch and symmetrical to face.  VII: Face is symmetrical resting and smiling VIII: hearing intact to voice. IX, X: Phonation is normal.  XII: tongue is midline without fasciculations. Motor: 5/5 strength to all muscle groups tested.  Right arm orbits left.  Diminished fine finger movements on the left.  Mild left grip weakness.  Orbits right over left upper extremity. Tone: is normal and bulk is normal Sensation- Intact to light touch bilaterally. Extinction absent to light touch to DSS.   Coordination: FTN intact bilaterally Gait- deferred  Most Recent NIH 0  ASSESSMENT/PLAN  Mr. Hayze Gazda is a 89 y.o. male with history of A-fib on Eliquis, CHF, CAD status post CABG, TAVR, sleep apnea, thrombocytopenia, CKD 3, hypertension and hyperlipidemia admitted for acute onset left-sided weakness.  Patient states that he has been compliant with his Eliquis but that he is taking half doses due to concern for rectal bleeding.  Suspect that this is not adequate to control his risk of embolism in the setting of A-fib.  Will advise patient to take full dose Eliquis and discuss Watchman device with outpatient cardiologist. 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 Small vessel disease. Atrophy. ASPECTS 10.    CTA head & neck 50% stenosis of proximal right ICA, mild stenosis at right V3 V4  junction, mild to moderate stenosis at left vertebral artery origin, no LVO MRI multiple small acute or early subacute infarcts in high right posterior frontal perirolandic area 2D Echo pending LDL 98 HgbA1c 6.1 VTE prophylaxis -fully anticoagulated with Eliquis Eliquis (apixaban) daily prior to admission, now on Eliquis (apixaban) daily  Therapy recommendations:  Outpatient PT/OT/ST Disposition: Pending, likely home  Atrial fibrillation Home Meds: Eliquis 2.5 mg every morning and 2.5 mg at bedtime on Monday, metoprolol 50 mg daily Wednesday and Friday Continue telemetry monitoring Begin anticoagulation with Eliquis 2.5 mg twice daily  Hypertension Home meds: None Stable Blood Pressure Goal: BP less than 220/110   Hyperlipidemia Home meds: None LDL 98, goal < 70 Add ezetimibe 10 mg daily High intensity statin not indicated due to previous intolerance Continue statin at discharge  Other Stroke Risk Factors Obstructive sleep apnea, on CPAP at home   Other Active Problems Hypothyroidism-continue home Synthroid  Hospital day # 0  Patient seen by NP with MD, MD to edit note as needed. Cortney E Ernestina Columbia , MSN, AGACNP-BC [facility] See Amion for schedule and pager information 05/04/2023 12:11 PM   STROKE MD NOTE :  I have personally obtained history,examined this patient, reviewed notes, independently viewed imaging studies, participated in medical decision making and plan of care.ROS completed by me personally and pertinent positives fully documented  I have made any additions or clarifications directly to the above note. Agree with note above.  Patient with A-fib on subtherapeutic anticoagulation with half dose Eliquis presents with left-sided weakness with MRI showing small patchy embolic right frontal infarcts.  Patient counseled to be compliant with proper dose of Eliquis start 5 mg twice daily.  Continue ongoing stroke workup.  Mobilize out of bed.  Therapy  consults.  Check echo results.  Aggressive risk factor modification.  Patient has history of statin intolerance hence an Zetia and if LDL is still suboptimally controlled may consider Praluent or Repatha injections as an outpatient.  Greater than 50% time during this 50-minute visit was spent in counseling and coordination of care and discussion patient care team and answering questions.  Delia Heady, MD Medical Director [facility] Pager: [phone] 05/04/2023 12:45 PM   To contact Stroke Continuity provider, please refer to [email or web address]. After hours, contact General Neurology

## 2023-05-06 ENCOUNTER — Telehealth: Payer: Self-pay | Admitting: Cardiology

## 2023-05-06 DIAGNOSIS — I2510 Atherosclerotic heart disease of native coronary artery without angina pectoris: Secondary | ICD-10-CM

## 2023-05-06 NOTE — Telephone Encounter (Signed)
Agree with addition of Zetia 10mg  daily with previously prescribed Atorvastatin to get LDL to goal <70 (ideally <55). Alternative would be Repatha (injectable cholesterol medication). Plan to repeat FLP/LFT in 6-8 weeks. If interested in injectable therapy prior, reasonable to set up for follow up visit with PharmD.   Alver Sorrow, NP

## 2023-05-06 NOTE — Telephone Encounter (Signed)
 Left message to call back

## 2023-05-06 NOTE — Telephone Encounter (Signed)
Patient's wife is calling to get update on medication and to get advisement on appts.

## 2023-05-06 NOTE — Telephone Encounter (Signed)
Pt c/o medication issue:  1. Name of Medication:  ezetimibe (ZETIA) 10 MG tablet  2. How are you currently taking this medication (dosage and times per day)?   3. Are you having a reaction (difficulty breathing--STAT)?   4. What is your medication issue?   Wife states Friday afternoon patient has a light stroke. EMS transported him to [facility]. It was determined he has a stroke and irregular HR caused it. She states Ezetimibe was prescribed and they are supposed to pick it up today. She would like to know if Dr. Cristal Deer agrees.

## 2023-05-09 NOTE — Addendum Note (Signed)
Addended by: Marlene Lard on: 05/09/2023 01:52 PM   Modules accepted: Orders

## 2023-05-09 NOTE — Telephone Encounter (Signed)
Pt's wife is returning call. Transferred to [employer], Charity fundraiser.

## 2023-05-09 NOTE — Telephone Encounter (Signed)
Returned call, transferred from call center.  Patient will stay on the pill at this time, repeat labs ordered and mailed to patient.

## 2023-05-09 NOTE — Telephone Encounter (Signed)
 2nd call attempt, no answer, left message to call back

## 2023-05-24 ENCOUNTER — Telehealth: Payer: Self-pay | Admitting: Cardiology

## 2023-05-24 NOTE — Telephone Encounter (Signed)
 Hi there, I saw that you wrote this patient's discharge summary from the hospital. They are scheduled to follow up here with Dr. Cristal Deer. I noticed the ED summary it says follow up with Stroke clinic, but  no referrals are in for where that is/ should be? Unsure how to help this patient with that without more information.

## 2023-05-24 NOTE — Telephone Encounter (Signed)
 Pt spouse called in about hospital stay discharge summary. She has been trying to sch with "stroke clinic" per discharge notes but she wasn't sure what that was. I told her they might be referring to her seeing a Neurologist which would probably require a referral. Please advise.

## 2023-06-07 ENCOUNTER — Telehealth: Payer: Self-pay | Admitting: Cardiology

## 2023-06-07 MED ORDER — EZETIMIBE 10 MG PO TABS
10.0000 mg | ORAL_TABLET | Freq: Every day | ORAL | 0 refills | Status: DC
Start: 2023-06-07 — End: 2023-07-08

## 2023-06-07 NOTE — Telephone Encounter (Signed)
Refills sent to requested pharmacy. 

## 2023-06-07 NOTE — Telephone Encounter (Signed)
*  STAT* If patient is at the pharmacy, call can be transferred to refill team.   1. Which medications need to be refilled? (please list name of each medication and dose if known)   ezetimibe (ZETIA) 10 MG tablet    2. Which pharmacy/location (including street and city if local pharmacy) is medication to be sent to? [id number] - [place], [place] - [address] Phone: [phone]  Fax: [phone]     3. Do they need a 30 day or 90 day supply? 90

## 2023-06-19 DIAGNOSIS — I10 Essential (primary) hypertension: Secondary | ICD-10-CM | POA: Diagnosis not present

## 2023-06-19 DIAGNOSIS — D6869 Other thrombophilia: Secondary | ICD-10-CM | POA: Diagnosis not present

## 2023-06-19 DIAGNOSIS — E039 Hypothyroidism, unspecified: Secondary | ICD-10-CM | POA: Diagnosis not present

## 2023-06-19 DIAGNOSIS — Z0000 Encounter for general adult medical examination without abnormal findings: Secondary | ICD-10-CM | POA: Diagnosis not present

## 2023-06-19 DIAGNOSIS — R7303 Prediabetes: Secondary | ICD-10-CM | POA: Diagnosis not present

## 2023-06-19 DIAGNOSIS — N2581 Secondary hyperparathyroidism of renal origin: Secondary | ICD-10-CM | POA: Diagnosis not present

## 2023-06-19 DIAGNOSIS — E7800 Pure hypercholesterolemia, unspecified: Secondary | ICD-10-CM | POA: Diagnosis not present

## 2023-06-19 DIAGNOSIS — I4821 Permanent atrial fibrillation: Secondary | ICD-10-CM | POA: Diagnosis not present

## 2023-06-19 DIAGNOSIS — N184 Chronic kidney disease, stage 4 (severe): Secondary | ICD-10-CM | POA: Diagnosis not present

## 2023-06-19 DIAGNOSIS — Z8739 Personal history of other diseases of the musculoskeletal system and connective tissue: Secondary | ICD-10-CM | POA: Diagnosis not present

## 2023-06-25 DIAGNOSIS — I2510 Atherosclerotic heart disease of native coronary artery without angina pectoris: Secondary | ICD-10-CM | POA: Diagnosis not present

## 2023-06-25 LAB — LIPID PANEL
Chol/HDL Ratio: 3.5 ratio (ref 0.0–5.0)
Cholesterol, Total: 159 mg/dL (ref 100–199)
HDL: 45 mg/dL (ref >39)
LDL Chol Calc (NIH): 91 mg/dL (ref 0–99)
Triglycerides: 130 mg/dL (ref 0–149)
VLDL Cholesterol Cal: 23 mg/dL (ref 5–40)

## 2023-06-25 LAB — HEPATIC FUNCTION PANEL
ALT: 23 IU/L (ref 0–44)
AST: 27 IU/L (ref 0–40)
Albumin: 4.9 g/dL — ABNORMAL HIGH (ref 3.7–4.7)
Alkaline Phosphatase: 80 IU/L (ref 44–121)
Bilirubin Total: 0.8 mg/dL (ref 0.0–1.2)
Bilirubin, Direct: 0.32 mg/dL (ref 0.00–0.40)
Total Protein: 7.2 g/dL (ref 6.0–8.5)

## 2023-06-27 ENCOUNTER — Telehealth (HOSPITAL_BASED_OUTPATIENT_CLINIC_OR_DEPARTMENT_OTHER): Payer: Self-pay

## 2023-06-27 DIAGNOSIS — E782 Mixed hyperlipidemia: Secondary | ICD-10-CM

## 2023-06-27 NOTE — Telephone Encounter (Addendum)
 Left message for patient to call back    ----- Message from Alver Sorrow sent at 06/27/2023  7:58 AM EDT ----- Normal liver enzymes. LDL (bad cholesterol) decreased from 98 to 91. LDL not at goal <55. Would recommend addition of PCSK9i to further lower cholesterol and cardiovascular risk. Recommend visit with PharmD lipid clinic.  Provider note only: LDL goal <55 due to history of stroke, age, prior CABG.

## 2023-06-27 NOTE — Addendum Note (Signed)
 Addended by: Marlene Lard on: 06/27/2023 09:13 AM   Modules accepted: Orders

## 2023-06-27 NOTE — Telephone Encounter (Signed)
 Pt & wife returned call- transferred from call center- results reviewed, understanding verbalized, ref placed

## 2023-07-05 ENCOUNTER — Encounter: Payer: Self-pay | Admitting: Pharmacist

## 2023-07-05 ENCOUNTER — Telehealth: Payer: Self-pay | Admitting: Pharmacy Technician

## 2023-07-05 ENCOUNTER — Other Ambulatory Visit (HOSPITAL_COMMUNITY): Payer: Self-pay

## 2023-07-05 ENCOUNTER — Ambulatory Visit: Attending: Cardiology | Admitting: Pharmacist

## 2023-07-05 ENCOUNTER — Telehealth: Payer: Self-pay | Admitting: Pharmacist

## 2023-07-05 DIAGNOSIS — E785 Hyperlipidemia, unspecified: Secondary | ICD-10-CM

## 2023-07-05 DIAGNOSIS — E782 Mixed hyperlipidemia: Secondary | ICD-10-CM | POA: Diagnosis not present

## 2023-07-05 MED ORDER — REPATHA SURECLICK 140 MG/ML ~~LOC~~ SOAJ: 140.0000 mg | SUBCUTANEOUS | 3 refills | Status: DC

## 2023-07-05 NOTE — Telephone Encounter (Signed)
 Pharmacy Patient Advocate Encounter  Received notification from [employer] that Prior Authorization for repatha  has been APPROVED from 07/02/23 to 01/01/24. Ran test claim, Copay is $47.00- one month. This test claim was processed through [facility]- copay amounts may vary at other pharmacies due to pharmacy/plan contracts, or as the patient moves through the different stages of their insurance plan.   PA #/Case ID/Reference #: [id number]

## 2023-07-05 NOTE — Patient Instructions (Signed)
 Your Results:             Your most recent labs Goal  Total Cholesterol 159 < 200  Triglycerides 130 < 150  HDL (happy/good cholesterol) 45 > 40  LDL (lousy/bad cholesterol) 81 < 70   Medication changes: continue taking atorvastatin  20 mg three times per week and ezetimibe  10 mg daily. Start taking Repatha  140 mg under the skin every 14 days    Repatha  is a cholesterol medication that improved your body's ability to get rid of "bad cholesterol" known as LDL. It can lower your LDL up to 60%! It is an injection that is given under the skin every 2 weeks. The medication often requires a prior authorization from your insurance company. We will take care of submitting all the necessary information to your insurance company to get it approved. The most common side effects of Repatha  include runny nose, symptoms of the common cold, rarely flu or flu-like symptoms, back/muscle pain in about 3-4% of the patients, and redness, pain, or bruising at the injection site.   Lab orders: We want to repeat labs after 2-3 months.  We will send you a lab order to remind you once we get closer to that time.

## 2023-07-05 NOTE — Progress Notes (Signed)
 Patient ID: [name]                 DOB: 06-21-1931                    MRN: [id number]      HPI: [name] is a 89 y.o. male patient referred to lipid clinic by Jennefer Moats. PMH is significant for recent stroke -04/2023, CAD hx of CABG, CKD stage IV, HFpEF, PAF, hypothyroidism, mitral valve repair,interstitial lung disease,low flow low gradient aortic stenosis s/p TAVR  Patient recent lab showed LDLc reduction from 98 mg/dl 91 by addition of Zetia  to high intensity statin . LFTs was WNL. Patient was referred to lipid clinic   Patient presented today for lipid clinic with his wife, reports he can tolerate Atorvastatin  20 mg only 3 times a week. He has been taking Zetia  without any problem. Wife is 62 and he is 83 so hard to keep track of everything so their daughter is helping them to stay organized. Reviewed Repatha .  Discussed mechanisms of action, dosing, side effects and potential decreases in LDL cholesterol.  Also reviewed cost information and potential options for patient assistance.  Current Medications: Lipitor 20 mg  3 times per week and Zetia  10 mg daily  Intolerances: Lipitor 20 mg daily- muscle weakness  Risk Factors: history of stroke,prior CABG, HLD, family hx of ASCVD LDL goal: <70 mg/dl  Last lab 78/29/5621 : TC 159, TG 130, HDL 45, LDL 91  Diet: low salt and almost no sugar, low fat diet  Eats out 3 times per week   Exercise:  Stays active around the house  Family History:  Relation Problem Comments  Mother (Deceased) Stroke Deceased    Father (Deceased) Heart disease Deceased    Brother Heart failure Deceased    Brother Stroke     Social History:  Alcohol: none  Smoking: none  Labs:  Lipid Panel     Component Value Date/Time   CHOL 159 06/25/2023 0903   TRIG 130 06/25/2023 0903   HDL 45 06/25/2023 0903   CHOLHDL 3.5 06/25/2023 0903   CHOLHDL 3.6 05/04/2023 0554   VLDL 13 05/04/2023 0554   LDLCALC 91 06/25/2023 0903   LABVLDL 23  06/25/2023 0903    Past Medical History:  Diagnosis Date   Adenomatous colon polyp    CAD (coronary artery disease)    a. S/P CABG x 1 @ time of MV annuloplasty;  b. 02/2010 ETT: neg for ischemia.   Carotid artery stenosis    1-39% by dopplers 10/2016   Chronic combined systolic (congestive) and diastolic (congestive) heart failure (HCC)    CKD (chronic kidney disease), stage III (HCC)    Current use of long term anticoagulation    a. Coumadin  in setting of afib.   Diverticulosis    Dyslipidemia    Dysrhythmia    A-fib   Essential hypertension    H/O mitral valve repair 09/16/2002   Complex valvuloplasty including quadrangular resection of posterior leaflet with artificial Gore-tex neochords and 28 mm Seguin ring annuloplasty - Dr Nicanor Barge   Hemorrhoids    Hyperlipidemia    Hypertension    Permanent atrial fibrillation (HCC)    a. CHA2DS2VASc = 6 -->chronic coumadin ;  b. Prev on amio-->d/c 2/2 hypothyroidism. >> resumed 5/16. c. Notes from 2017 indicate interstitial lung disease by pulm appt, question amio toxicity, also increased liver attenuation. Rate control pursued and amiodarone  discontinued.   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     apixaban  (ELIQUIS ) 5 MG TABS tablet Take 1 tablet (5 mg total) by mouth 2 (two) times daily. 60 tablet 0   atorvastatin  (LIPITOR) 20 MG tablet Take 20 mg by mouth 3 (three) times a week. TAKE 20 MG ON MONDAY, WEDNESDAY, AND FRIDAY NIGHTS.     Cholecalciferol 50 MCG (2000 UT) TABS Take 1 tablet by  mouth daily.     cyanocobalamin  (VITAMIN B12) 500 MCG tablet Take 500 mcg by mouth daily.     docusate sodium  (COLACE) 100 MG capsule Take 100 mg by mouth daily.     ezetimibe  (ZETIA ) 10 MG tablet Take 1 tablet (10 mg total) by mouth daily. 30 tablet 0   hydrocortisone  cream 0.5 % Apply 1 Application topically 2 (two) times daily as needed for itching.     levothyroxine  (SYNTHROID ) 75 MCG tablet Take 75 mcg by mouth daily before breakfast.      metoprolol  tartrate (LOPRESSOR ) 100 MG tablet Take 50 mg by mouth daily.     torsemide  (DEMADEX ) 20 MG tablet Take 20-40 mg by mouth See admin instructions. Take 2 tablets (40 mg) by mouth in the morning & take 1 tablet (20 mg) by mouth at night.     No current facility-administered medications on file prior to visit.    Allergies  Allergen Reactions   Crestor [Rosuvastatin] Other (See Comments)    myalgia   Asa [Aspirin ] Other (See Comments)    Bleeding- has internal hemorrhoids   Flomax [Tamsulosin] Palpitations and Other (See Comments)    Made his heart go out of rhythm   Lipitor [Atorvastatin ]     "loss of muscle control"   Mirtazapine      "Tremor"   Pirfenidone     Weight loss, loss of appetite.   Tramadol  Nausea And Vomiting    Assessment/Plan:  1. Hyperlipidemia -  Problem  Dyslipidemia   Current Medications: Lipitor 20 mg  3 times per week and Zetia  10 mg daily  Intolerances: Lipitor 20 mg daily- muscle weakness  Risk Factors: history of stroke,prior CABG, HLD, family hx of ASCVD LDL goal: <70 mg/dl  Last lab 16/12/9602 : TC 159, TG 130, HDL 45, LDL 91     Dyslipidemia Assessment:  LDL goal: < 70  mg/dl last LDLc 91 mg/dl while on Lipitor 20 mg 3X week and Zetia  10 mg daily  Discussed Repatha  ; cost, dosing efficacy, side effects  Reiterated importance of healthy diet and physical activity   Plan: Continue taking current medications (Lipitor 20 mg 3X week and Zetia  10 mg daily ) Received PA approval for Repatha  - start  taking Repatha  140 mg under the sking every 14 days  Will get follow up lab on July 10 - lab paperwork provided to the patient       Thank you,  Nickola Baron, Pharm.D [facility] A Division of [facility] [address], [place], [place]  [address]  Phone: [phone]; Fax: [phone]

## 2023-07-05 NOTE — Telephone Encounter (Signed)
 Pharmacy Patient Advocate Encounter   Received notification from Pt Calls Messages that prior authorization for repatha  is required/requested.   Insurance verification completed.   The patient is insured through [facility] ADVANTAGE/RX ADVANCE .   Per test claim: PA required; PA submitted to above mentioned insurance via CoverMyMeds Key/confirmation #/EOC [id number] Status is pending

## 2023-07-05 NOTE — Assessment & Plan Note (Signed)
 Assessment:  LDL goal: < 70  mg/dl last LDLc 91 mg/dl while on Lipitor 20 mg 3X week and Zetia  10 mg daily  Discussed Repatha  ; cost, dosing efficacy, side effects  Reiterated importance of healthy diet and physical activity   Plan: Continue taking current medications (Lipitor 20 mg 3X week and Zetia  10 mg daily ) Received PA approval for Repatha  - start taking Repatha  140 mg under the sking every 14 days  Will get follow up lab on July 10 - lab paperwork provided to the patient

## 2023-07-08 ENCOUNTER — Other Ambulatory Visit: Payer: Self-pay | Admitting: Cardiology

## 2023-07-09 NOTE — Telephone Encounter (Signed)
 PA approved see other encounter for more info

## 2023-07-22 DIAGNOSIS — N184 Chronic kidney disease, stage 4 (severe): Secondary | ICD-10-CM | POA: Diagnosis not present

## 2023-07-29 ENCOUNTER — Telehealth (HOSPITAL_BASED_OUTPATIENT_CLINIC_OR_DEPARTMENT_OTHER): Payer: Self-pay | Admitting: Cardiology

## 2023-07-29 NOTE — Telephone Encounter (Signed)
 Spoke with wife, ok per DPR  Wife stated patient has internal hemorrhoid  Last year was having bleeding so she decreased Eliquis   Age 89 last Cr 2.31 2 months ago  He is taking Eliquis  5 mg 1/2 tablet every morning and 1/2 tablet Monday, Wednesday, Friday evenings only This did help with the bleeding until he started the Repatha  Bleeding has increased to daily, does not occur just with bowel movement Is wearing a depends secondary to bleeding and is getting weak  Has only had 1 Repatha  injection Denies any diarrhea  Advised should not be coming from the Repatha  but ok to hold this weeks dose and discuss at follow up Friday  Ischemic stroke in February  Could not get through to anyone to get neurology appointment  Has appointment with neurologist through [facility] in July   Will discuss with Dr Veryl Gottron and call wife back

## 2023-07-29 NOTE — Telephone Encounter (Signed)
  Pt c/o medication issue:  1. Name of Medication: Evolocumab  (REPATHA  SURECLICK) 140 MG/ML SOAJ   2. How are you currently taking this medication (dosage and times per day)? Inject 140 mg into the skin every 14 (fourteen) days.   3. Are you having a reaction (difficulty breathing--STAT)? No   4. What is your medication issue? Pt's wife said, pt is having a lot of bleeding every time he goes to the bathroom. They think its the repatha , his next dose is on 05/14. They would like to know if the pt have to take it or hold it and wait to see Dr. Veryl Gottron on Friday

## 2023-07-29 NOTE — Telephone Encounter (Signed)
 Discussed with Dr Veryl Gottron, recommendations below  Patient needs to start taking Eliquis  2.5 mg twice a day everyday, stroke in February  Contact PCP/GI regarding the rectal bleeding   Advised wife, verbalized understanding

## 2023-08-01 DIAGNOSIS — N2581 Secondary hyperparathyroidism of renal origin: Secondary | ICD-10-CM | POA: Diagnosis not present

## 2023-08-01 DIAGNOSIS — N184 Chronic kidney disease, stage 4 (severe): Secondary | ICD-10-CM | POA: Diagnosis not present

## 2023-08-01 DIAGNOSIS — D631 Anemia in chronic kidney disease: Secondary | ICD-10-CM | POA: Diagnosis not present

## 2023-08-01 DIAGNOSIS — I129 Hypertensive chronic kidney disease with stage 1 through stage 4 chronic kidney disease, or unspecified chronic kidney disease: Secondary | ICD-10-CM | POA: Diagnosis not present

## 2023-08-01 DIAGNOSIS — I509 Heart failure, unspecified: Secondary | ICD-10-CM | POA: Diagnosis not present

## 2023-08-01 DIAGNOSIS — K922 Gastrointestinal hemorrhage, unspecified: Secondary | ICD-10-CM | POA: Diagnosis not present

## 2023-08-02 ENCOUNTER — Encounter (HOSPITAL_BASED_OUTPATIENT_CLINIC_OR_DEPARTMENT_OTHER): Payer: Self-pay | Admitting: Cardiology

## 2023-08-02 ENCOUNTER — Ambulatory Visit (HOSPITAL_BASED_OUTPATIENT_CLINIC_OR_DEPARTMENT_OTHER): Payer: PPO | Admitting: Cardiology

## 2023-08-02 VITALS — BP 124/62 | HR 61 | Ht 67.5 in | Wt 176.0 lb

## 2023-08-02 DIAGNOSIS — D6869 Other thrombophilia: Secondary | ICD-10-CM | POA: Diagnosis not present

## 2023-08-02 DIAGNOSIS — Z9889 Other specified postprocedural states: Secondary | ICD-10-CM | POA: Diagnosis not present

## 2023-08-02 DIAGNOSIS — Z7901 Long term (current) use of anticoagulants: Secondary | ICD-10-CM | POA: Diagnosis not present

## 2023-08-02 DIAGNOSIS — N184 Chronic kidney disease, stage 4 (severe): Secondary | ICD-10-CM

## 2023-08-02 DIAGNOSIS — I2510 Atherosclerotic heart disease of native coronary artery without angina pectoris: Secondary | ICD-10-CM

## 2023-08-02 DIAGNOSIS — Z8673 Personal history of transient ischemic attack (TIA), and cerebral infarction without residual deficits: Secondary | ICD-10-CM | POA: Diagnosis not present

## 2023-08-02 DIAGNOSIS — I4821 Permanent atrial fibrillation: Secondary | ICD-10-CM | POA: Diagnosis not present

## 2023-08-02 DIAGNOSIS — Z952 Presence of prosthetic heart valve: Secondary | ICD-10-CM

## 2023-08-02 NOTE — Progress Notes (Signed)
 Cardiology Office Note:  .   Date:  08/02/2023  ID:  [name], DOB 08-28-1931, MRN [id number] PCP: Micheal Congo, MD  [facility] Providers Cardiologist:  Sheryle Donning, MD {  History of Present Illness: .   [name] is a 89 y.o. male with a hx of CAD s/p CABG, mitral valve repair, atrial fibrillation on anticoagulation, interstitial lung disease, chronic systolic and diastolic heart failure, low flow low gradient aortic stenosis s/p TAVR who is seen in follow up today. He follows at the [facility] in [place].   Today: Since our last visit, he was admitted in February for acute CVA. Pending neurology evaluation, wasn't able to get in with neurology through [facility], but was able to get appt with neurology through the [facility] in July.  Has been started on ezetimibe  and repatha  since that time. Had one episode of hematochezia on the ezetimibe , but then became frequent on the repatha  Noted that since starting this, he has had bright red blood per rectum. He reports this is mixed with stool and also colors the water red. Was happening daily, but has not occurred in the last three days. No pain with bowel movements. Does have history of internal hemorrhoid with prior minimal bleeding.  Taking apixaban  as 1/2 pill (2.5 mg) twice a day, which is appropriate given age and renal function.   Asking about a letter they got about a home monitoring system, discussed my recommendations today.  ROS: Denies chest pain, shortness of breath at rest or with normal exertion. No PND, orthopnea, LE edema or unexpected weight gain. No syncope or palpitations. ROS otherwise negative except as noted.   Studies Reviewed: Aaron Aas    EKG:       Physical Exam:   VS:  BP 124/62   Pulse 61   Ht 5' 7.5" (1.715 m)   Wt 176 lb (79.8 kg)   SpO2 95%   BMI 27.16 kg/m    Wt Readings from Last 3 Encounters:  08/02/23 176 lb (79.8 kg)  05/03/23 176 lb 9.4 oz (80.1 kg)  01/25/23 177 lb (80.3 kg)    GEN:  Well nourished, well developed in no acute distress HEENT: Normal, moist mucous membranes NECK: No JVD CARDIAC: irregularly irregular rhythm, normal S1 and S2, no rubs or gallops. 1/6 systolic murmur. VASCULAR: Radial and DP pulses 2+ bilaterally. No carotid bruits RESPIRATORY:  Clear to auscultation without rales, wheezing or rhonchi  ABDOMEN: Soft, non-tender, non-distended MUSCULOSKELETAL:  Ambulates independently SKIN: Warm and dry, no edema NEUROLOGIC:  Alert and oriented x 3. No focal neuro deficits noted. PSYCHIATRIC:  Normal affect    ASSESSMENT AND PLAN: .    Recent CVA 04/2023 Hyperlipidemia -pending follow up with neurology through the [facility] -doing very well since, at home, has not required PT -discussed anticoagulation, cholesterol management. Discussed LDL goal of <70, was 91 at admission. Has been started on ezetimibe  and repatha , due for recheck 09/2023  Hematochezia -has history of internal hemorrhoids. Concern from wife was if this was related to ezetimibe  or repatha . This is not a known/common side effect. Recommend she restart repatha  and monitor for recurrence -we did discuss Watchman vs. DOAC for anticoagulation with his afib and history of stroke. After shared decision making, we all agreed to continue anticoagulation at this time unless severe bleeding develops -if hemorrhoidal bleeding persists, could discuss with his GI team if there are treatment options available  Prior combined heart failure/cardiomyopathy, now with recovered EF -EF 25-30%  09/2019 -echo 05/19/20 reported as EF 60-65%. While it does not appear quite that vigorous to me, it is significantly improved from prior echo -reports doing well on torsemide  40 mg in the AM and 20 mg in the PM -continue metoprolol  succinate -no room for ACEi/ARB/ARNI/MRA given history of hypotension and CKD   Atrial Fibrillation, permanent:  -drastically improved rate control since TAVR. Had been extremely difficult to control  rate prior.  -tolerating metoprolol  succinate 50 mg daily, increased dose leads to worsening hypotension -not controlled on amiodarone  in the past (and with liver/lung pathology, would avoid), no diltiazem  given prior low EF, avoid digoxin given CKD -tolerating apixaban  2.5 mg BID (reduced for age, Cr). -CHA2DS2/VAS Stroke Risk Points = 7 now with CVA -did discuss with bleeding, could consider watchman if we cannot otherwise control hematochezia, but both their and my preference with stroke would be DOAC if we can manage it.   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 Zelda Hickman at [facility]   CAD Hyperlipidemia: -remote h/o CABG. -no chest pain -no aspirin  given apixaban  for afib -Goal LDL <70, last LDL 91 at time of CVA (prior to repatha  and ezetimibe ) -continue atorvastatin , ezetimibe , repatha    Mitral Regurgitation: h/o MV repair. Mild-moderate on TAVR TEE   Aortic Stenosis, severe, s/p TAVR:  -has endocarditis prophylaxis ordered  Dispo: 6 mos or sooner as needed  Total time of encounter: I spent 42 minutes dedicated to the care of this patient on the date of this encounter to include pre-visit review of records, face-to-face time with the patient discussing conditions above, and clinical documentation with the electronic health record. We specifically spent time today discussing his hospitalization for stroke, recommendations/guidelines for management, especially anticoagulation and cholesterol management, discussion of hematochezia, possible etiologies, and options for management.   Signed, Sheryle Donning, MD   Sheryle Donning, MD, PhD, [facility]  [facility]  [place]  [facility] & Vascular at [facility] at [facility] [address], [address] [place], [place] [address] [phone]

## 2023-08-02 NOTE — Patient Instructions (Signed)

## 2023-08-22 DIAGNOSIS — L218 Other seborrheic dermatitis: Secondary | ICD-10-CM | POA: Diagnosis not present

## 2023-08-22 DIAGNOSIS — D692 Other nonthrombocytopenic purpura: Secondary | ICD-10-CM | POA: Diagnosis not present

## 2023-08-22 DIAGNOSIS — Z85828 Personal history of other malignant neoplasm of skin: Secondary | ICD-10-CM | POA: Diagnosis not present

## 2023-08-22 DIAGNOSIS — L570 Actinic keratosis: Secondary | ICD-10-CM | POA: Diagnosis not present

## 2023-10-14 DIAGNOSIS — H26491 Other secondary cataract, right eye: Secondary | ICD-10-CM | POA: Diagnosis not present

## 2023-10-14 DIAGNOSIS — H401221 Low-tension glaucoma, left eye, mild stage: Secondary | ICD-10-CM | POA: Diagnosis not present

## 2023-10-14 DIAGNOSIS — Z961 Presence of intraocular lens: Secondary | ICD-10-CM | POA: Diagnosis not present

## 2023-12-12 DIAGNOSIS — R7303 Prediabetes: Secondary | ICD-10-CM | POA: Diagnosis not present

## 2023-12-12 DIAGNOSIS — E7800 Pure hypercholesterolemia, unspecified: Secondary | ICD-10-CM | POA: Diagnosis not present

## 2023-12-12 DIAGNOSIS — I4821 Permanent atrial fibrillation: Secondary | ICD-10-CM | POA: Diagnosis not present

## 2023-12-12 DIAGNOSIS — Z23 Encounter for immunization: Secondary | ICD-10-CM | POA: Diagnosis not present

## 2023-12-12 DIAGNOSIS — E039 Hypothyroidism, unspecified: Secondary | ICD-10-CM | POA: Diagnosis not present

## 2023-12-12 DIAGNOSIS — I10 Essential (primary) hypertension: Secondary | ICD-10-CM | POA: Diagnosis not present

## 2023-12-12 DIAGNOSIS — Z8739 Personal history of other diseases of the musculoskeletal system and connective tissue: Secondary | ICD-10-CM | POA: Diagnosis not present

## 2024-01-03 ENCOUNTER — Other Ambulatory Visit: Payer: Self-pay | Admitting: Cardiology

## 2024-01-20 DIAGNOSIS — E782 Mixed hyperlipidemia: Secondary | ICD-10-CM | POA: Diagnosis not present

## 2024-01-20 DIAGNOSIS — N184 Chronic kidney disease, stage 4 (severe): Secondary | ICD-10-CM | POA: Diagnosis not present

## 2024-01-21 ENCOUNTER — Ambulatory Visit: Payer: Self-pay | Admitting: Pharmacist

## 2024-01-21 LAB — LIPID PANEL
Chol/HDL Ratio: 1.8 ratio (ref 0.0–5.0)
Cholesterol, Total: 85 mg/dL — ABNORMAL LOW (ref 100–199)
HDL: 46 mg/dL (ref >39)
LDL Chol Calc (NIH): 18 mg/dL (ref 0–99)
Triglycerides: 116 mg/dL (ref 0–149)
VLDL Cholesterol Cal: 21 mg/dL (ref 5–40)

## 2024-01-21 MED ORDER — REPATHA SURECLICK 140 MG/ML ~~LOC~~ SOAJ: 140.0000 mg | SUBCUTANEOUS | 3 refills | Status: DC

## 2024-01-21 NOTE — Telephone Encounter (Signed)
 Call to discuss lab. Spoke to to wife per DPR. LDLc 18 mg/dl Advised to stop Zetia  ( [employer] informed spoke to [place] [facility]) and patient to continue taking Repatha  140 mg every 14 days along with Atorvastatin  20 mg 3 times per week.

## 2024-01-28 ENCOUNTER — Telehealth: Payer: Self-pay | Admitting: Cardiology

## 2024-01-28 ENCOUNTER — Encounter: Payer: Self-pay | Admitting: Cardiology

## 2024-01-28 NOTE — Telephone Encounter (Signed)
 error

## 2024-01-28 NOTE — Telephone Encounter (Signed)
 Please assist with updated PA. TY!  Letisia Schwalb S Tyjuan Demetro, NP

## 2024-01-28 NOTE — Telephone Encounter (Signed)
 Pt c/o medication issue:  1. Name of Medication: Evolocumab  (REPATHA  SURECLICK) 140 MG/ML SOAJ   2. How are you currently taking this medication (dosage and times per day)? As written  3. Are you having a reaction (difficulty breathing--STAT)? No   4. What is your medication issue? Pts pharmacy requesting PA for this medication.

## 2024-01-29 ENCOUNTER — Other Ambulatory Visit (HOSPITAL_COMMUNITY): Payer: Self-pay

## 2024-01-29 ENCOUNTER — Telehealth: Payer: Self-pay | Admitting: Pharmacy Technician

## 2024-01-29 NOTE — Telephone Encounter (Signed)
 Pharmacy Patient Advocate Encounter   Received notification from Physician's Office that prior authorization for repatha  is required/requested.   Insurance verification completed.   The patient is insured through [facility] ADVANTAGE/RX ADVANCE.   Per test claim: PA required; PA submitted to above mentioned insurance via Latent Key/confirmation #/EOC [id number] Status is pending

## 2024-01-30 ENCOUNTER — Other Ambulatory Visit (HOSPITAL_COMMUNITY): Payer: Self-pay

## 2024-01-30 DIAGNOSIS — R300 Dysuria: Secondary | ICD-10-CM | POA: Diagnosis not present

## 2024-01-30 DIAGNOSIS — I509 Heart failure, unspecified: Secondary | ICD-10-CM | POA: Diagnosis not present

## 2024-01-30 DIAGNOSIS — N184 Chronic kidney disease, stage 4 (severe): Secondary | ICD-10-CM | POA: Diagnosis not present

## 2024-01-30 DIAGNOSIS — I129 Hypertensive chronic kidney disease with stage 1 through stage 4 chronic kidney disease, or unspecified chronic kidney disease: Secondary | ICD-10-CM | POA: Diagnosis not present

## 2024-01-30 DIAGNOSIS — N2581 Secondary hyperparathyroidism of renal origin: Secondary | ICD-10-CM | POA: Diagnosis not present

## 2024-01-30 DIAGNOSIS — D631 Anemia in chronic kidney disease: Secondary | ICD-10-CM | POA: Diagnosis not present

## 2024-01-30 NOTE — Telephone Encounter (Signed)
 Please call to let patient know that prior auth approved.  TY!!  Reche GORMAN Finder, NP

## 2024-01-30 NOTE — Telephone Encounter (Signed)
 Pharmacy Patient Advocate Encounter  Received notification from [employer] that Prior Authorization for Repatha  has been APPROVED from 01/29/24 to 01/28/25. Ran test claim, Copay is $0.00- one month. This test claim was processed through [facility]- copay amounts may vary at other pharmacies due to pharmacy/plan contracts, or as the patient moves through the different stages of their insurance plan.   PA #/Case ID/Reference #: [id number]

## 2024-01-30 NOTE — Telephone Encounter (Signed)
 Left the pt a detailed message about PA approval for Repatha .   In the message advised him to call back with any additional questions/concerns regarding this matter.

## 2024-02-21 ENCOUNTER — Telehealth: Payer: Self-pay | Admitting: Cardiology

## 2024-02-21 DIAGNOSIS — D692 Other nonthrombocytopenic purpura: Secondary | ICD-10-CM | POA: Diagnosis not present

## 2024-02-21 DIAGNOSIS — L570 Actinic keratosis: Secondary | ICD-10-CM | POA: Diagnosis not present

## 2024-02-21 DIAGNOSIS — Z85828 Personal history of other malignant neoplasm of skin: Secondary | ICD-10-CM | POA: Diagnosis not present

## 2024-02-21 DIAGNOSIS — D1801 Hemangioma of skin and subcutaneous tissue: Secondary | ICD-10-CM | POA: Diagnosis not present

## 2024-02-21 NOTE — Telephone Encounter (Signed)
*  STAT* If patient is at the pharmacy, call can be transferred to refill team.   1. Which medications need to be refilled? (please list name of each medication and dose if known)   Evolocumab  (REPATHA  SURECLICK) 140 MG/ML SOAJ    2. Which pharmacy/location (including street and city if local pharmacy) is medication to be sent to?  [facility] [place]  - [place], [place] - [address]      3. Do they need a 30 day or 90 day supply? 90 day   Pt's wife stated it didn't come with the rest of his medication that was delivered

## 2024-02-21 NOTE — Telephone Encounter (Signed)
 Looks like it was filled 11/17 so its probably too soon to fill. Called and spoke with pt and wife. He still has 1 pen left.

## 2024-03-06 ENCOUNTER — Other Ambulatory Visit (HOSPITAL_COMMUNITY): Payer: Self-pay

## 2024-03-06 ENCOUNTER — Other Ambulatory Visit: Payer: Self-pay

## 2024-03-06 ENCOUNTER — Ambulatory Visit (HOSPITAL_BASED_OUTPATIENT_CLINIC_OR_DEPARTMENT_OTHER): Admitting: Cardiology

## 2024-03-06 ENCOUNTER — Encounter (HOSPITAL_BASED_OUTPATIENT_CLINIC_OR_DEPARTMENT_OTHER): Payer: Self-pay | Admitting: Cardiology

## 2024-03-06 ENCOUNTER — Other Ambulatory Visit (HOSPITAL_BASED_OUTPATIENT_CLINIC_OR_DEPARTMENT_OTHER): Payer: Self-pay

## 2024-03-06 VITALS — BP 112/58 | HR 90 | Ht 68.0 in | Wt 169.4 lb

## 2024-03-06 DIAGNOSIS — N184 Chronic kidney disease, stage 4 (severe): Secondary | ICD-10-CM

## 2024-03-06 DIAGNOSIS — K921 Melena: Secondary | ICD-10-CM | POA: Diagnosis not present

## 2024-03-06 DIAGNOSIS — D6869 Other thrombophilia: Secondary | ICD-10-CM

## 2024-03-06 DIAGNOSIS — I2510 Atherosclerotic heart disease of native coronary artery without angina pectoris: Secondary | ICD-10-CM | POA: Diagnosis not present

## 2024-03-06 DIAGNOSIS — I5020 Unspecified systolic (congestive) heart failure: Secondary | ICD-10-CM | POA: Insufficient documentation

## 2024-03-06 DIAGNOSIS — I4821 Permanent atrial fibrillation: Secondary | ICD-10-CM | POA: Diagnosis not present

## 2024-03-06 DIAGNOSIS — Z8673 Personal history of transient ischemic attack (TIA), and cerebral infarction without residual deficits: Secondary | ICD-10-CM | POA: Insufficient documentation

## 2024-03-06 DIAGNOSIS — Z7901 Long term (current) use of anticoagulants: Secondary | ICD-10-CM | POA: Diagnosis not present

## 2024-03-06 DIAGNOSIS — Z952 Presence of prosthetic heart valve: Secondary | ICD-10-CM | POA: Diagnosis not present

## 2024-03-06 MED ORDER — REPATHA SURECLICK 140 MG/ML ~~LOC~~ SOAJ
140.0000 mg | SUBCUTANEOUS | 3 refills | Status: AC
  Filled 2024-03-06 (×4): qty 6, 84d supply, fill #0

## 2024-03-06 NOTE — Patient Instructions (Addendum)

## 2024-03-06 NOTE — Progress Notes (Signed)
 " Cardiology Office Note:  .   Date:  03/06/2024  ID:  [name], DOB December 07, 1931, MRN [id number] PCP: Arloa Elsie SAUNDERS, MD  [facility] Providers Cardiologist:  Shelda Bruckner, MD {  History of Present Illness: .   [name] is a 89 y.o. male with a hx of CAD s/p CABG, mitral valve repair, aortic stenosis s/p TAVR, atrial fibrillation on anticoagulation, interstitial lung disease, chronic systolic and diastolic heart failure, CVA who is seen in follow up today. He follows at the [facility] in [place].   CV history: I met him during admission in 12/2017. He presented with heart failure (new systolic dysfunction). He had a history of afib with RVR, rates not well controlled due to hypotension. We were gradually able to get rates to improve with gradual med adjustment as an outpatient. In 2021 he underwent TAVR with Dr. Wonda after low-flow low-gradient AS thought to be contributing to his symptoms. Had significant improvement in his symptoms after TAVR and has done very well. He did have CVA in 04/2023. Evaluated by Dr. Rosemarie while admitted, thought to be embolic due to subtherapeutic apixaban  (patient had been taking daily rather than BID prior to admission). Also started on ezetimibe  and repatha  after that admission.   Today: Overall doing well. Wife is uncertain if they saw a neurologist at the [facility]. He has not had any concerning neurologic symptoms. Stays active at the house, no limitations other than he doesn't climb ladders any more. Reviewed recommendations today.  Having blood in his stool. We have discussed in the past. Happens about twice/week. Bright red, mostly on the toilet paper, rarely in the toilet bowl. Has a history of internal hemorrhoids  Last dose of repatha  11/20, hasn't received more since. Asking if this is an approval issue. Appears active in my system, will discuss with pharmacy team. He is taking apixaban  2.5 mg BID, confirmed today. He does now have a  cardiologist at the [facility]  ROS: Denies chest pain, shortness of breath at rest or with normal exertion. No PND, orthopnea, LE edema or unexpected weight gain. No syncope or palpitations. ROS otherwise negative except as noted.   Studies Reviewed: SABRA    EKG:       Physical Exam:   VS:  BP (!) 112/58 (BP Location: Right Arm, Patient Position: Sitting, Cuff Size: Normal)   Pulse 90   Ht 5' 8 (1.727 m)   Wt 169 lb 6.4 oz (76.8 kg)   SpO2 99%   BMI 25.76 kg/m    Wt Readings from Last 3 Encounters:  03/06/24 169 lb 6.4 oz (76.8 kg)  08/02/23 176 lb (79.8 kg)  05/03/23 176 lb 9.4 oz (80.1 kg)    GEN: Well nourished, well developed in no acute distress HEENT: Normal, moist mucous membranes NECK: No JVD CARDIAC: irregularly irregular rhythm, normal S1 and S2, no rubs or gallops. 1/6 systolic murmur. VASCULAR: Radial and DP pulses 2+ bilaterally. No carotid bruits RESPIRATORY:  Clear to auscultation without rales, wheezing or rhonchi  ABDOMEN: Soft, non-tender, non-distended MUSCULOSKELETAL:  Ambulates independently SKIN: Warm and dry, no edema NEUROLOGIC:  Alert and oriented x 3. No focal neuro deficits noted. PSYCHIATRIC:  Normal affect    ASSESSMENT AND PLAN: .    CVA 04/2023 -doing well, no residual neuro symptoms  Hematochezia -has history of internal hemorrhoids. Concern from wife was if this was related to ezetimibe  or repatha . Discussed this is more likely due to blood thinning of apixaban   plus the hemorrhoids being irritated. No severe bleeding. -we did discuss Watchman vs. DOAC for anticoagulation with his afib and history of stroke. After shared decision making, we all agreed to continue anticoagulation at this time unless severe bleeding develops -if hemorrhoidal bleeding persists, could discuss with his GI team if there are treatment options available  Prior combined heart failure/cardiomyopathy, now with recovered EF -EF 25-30% 09/2019 -echo 05/19/20 normalized, most recent  echo 04/2023 with EF 55-60%, mitral repair with mild-moderate MR, TAVR valve normal -reports doing well on torsemide  40 mg in the AM and 20 mg in the PM -continue metoprolol  succinate -no room for ACEi/ARB/ARNI/MRA given history of hypotension and CKD   Atrial Fibrillation, permanent:  -drastically improved rate control since TAVR. Had been extremely difficult to control rate prior.  -tolerating metoprolol  succinate 50 mg daily, increased dose leads to worsening hypotension.  -not controlled on amiodarone  in the past (and with liver/lung pathology, would avoid), no diltiazem  given prior low EF, avoid digoxin given CKD -tolerating apixaban  2.5 mg BID (reduced for age, Cr). -CHA2DS2/VAS Stroke Risk Points = 7 now with CVA -did discuss with bleeding, could consider watchman if we cannot otherwise control hematochezia, but both their and my preference with stroke would be DOAC if we can manage it.   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 Dennise at [facility]   CAD Hyperlipidemia: -remote h/o CABG. -no chest pain -no aspirin  given apixaban  for afib -Goal LDL <70, last LDL 18 -continue atorvastatin , ezetimibe , repatha    Mitral Regurgitation: h/o MV repair. Mild-moderate on TAVR TEE   Aortic Stenosis, severe, s/p TAVR:  -has endocarditis prophylaxis ordered  Dispo: 12 mos or sooner as needed (also has a cardiologist now at the [facility])  Signed, Shelda Bruckner, MD   Shelda Bruckner, MD, PhD, [facility] [place]  [facility]  [place]  Heart & Vascular at [facility] at [facility] [address], [address] [place], [place] [address] [phone]   "
